# Patient Record
Sex: Female | Born: 1945 | Race: White | Hispanic: No | Smoking: Former smoker
Health system: Southern US, Community
[De-identification: ages and names within clinical notes are randomized; demographics above are authoritative.]

## PROBLEM LIST (undated history)

## (undated) DIAGNOSIS — K5900 Constipation, unspecified: Secondary | ICD-10-CM

## (undated) DIAGNOSIS — IMO0001 Reserved for inherently not codable concepts without codable children: Secondary | ICD-10-CM

## (undated) DIAGNOSIS — H53009 Unspecified amblyopia, unspecified eye: Secondary | ICD-10-CM

## (undated) DIAGNOSIS — F450 Somatization disorder: Secondary | ICD-10-CM

## (undated) DIAGNOSIS — M545 Low back pain: Secondary | ICD-10-CM

## (undated) DIAGNOSIS — T8859XA Other complications of anesthesia, initial encounter: Secondary | ICD-10-CM

## (undated) DIAGNOSIS — R3589 Other polyuria: Secondary | ICD-10-CM

## (undated) DIAGNOSIS — R1013 Epigastric pain: Secondary | ICD-10-CM

## (undated) DIAGNOSIS — C801 Malignant (primary) neoplasm, unspecified: Secondary | ICD-10-CM

## (undated) DIAGNOSIS — G629 Polyneuropathy, unspecified: Secondary | ICD-10-CM

## (undated) DIAGNOSIS — M269 Dentofacial anomaly, unspecified: Secondary | ICD-10-CM

## (undated) DIAGNOSIS — J209 Acute bronchitis, unspecified: Secondary | ICD-10-CM

## (undated) DIAGNOSIS — R51 Headache: Secondary | ICD-10-CM

## (undated) DIAGNOSIS — H5000 Unspecified esotropia: Secondary | ICD-10-CM

## (undated) DIAGNOSIS — I10 Essential (primary) hypertension: Secondary | ICD-10-CM

## (undated) DIAGNOSIS — Z9289 Personal history of other medical treatment: Secondary | ICD-10-CM

## (undated) DIAGNOSIS — F411 Generalized anxiety disorder: Secondary | ICD-10-CM

## (undated) DIAGNOSIS — R7303 Prediabetes: Secondary | ICD-10-CM

## (undated) DIAGNOSIS — K219 Gastro-esophageal reflux disease without esophagitis: Secondary | ICD-10-CM

## (undated) DIAGNOSIS — H269 Unspecified cataract: Secondary | ICD-10-CM

## (undated) DIAGNOSIS — F459 Somatoform disorder, unspecified: Secondary | ICD-10-CM

## (undated) DIAGNOSIS — G43909 Migraine, unspecified, not intractable, without status migrainosus: Secondary | ICD-10-CM

## (undated) DIAGNOSIS — R7309 Other abnormal glucose: Secondary | ICD-10-CM

## (undated) DIAGNOSIS — G500 Trigeminal neuralgia: Secondary | ICD-10-CM

## (undated) DIAGNOSIS — F329 Major depressive disorder, single episode, unspecified: Secondary | ICD-10-CM

## (undated) DIAGNOSIS — E119 Type 2 diabetes mellitus without complications: Secondary | ICD-10-CM

## (undated) DIAGNOSIS — G4733 Obstructive sleep apnea (adult) (pediatric): Secondary | ICD-10-CM

## (undated) DIAGNOSIS — R358 Other polyuria: Secondary | ICD-10-CM

## (undated) DIAGNOSIS — J449 Chronic obstructive pulmonary disease, unspecified: Secondary | ICD-10-CM

## (undated) DIAGNOSIS — E785 Hyperlipidemia, unspecified: Secondary | ICD-10-CM

## (undated) DIAGNOSIS — M1990 Unspecified osteoarthritis, unspecified site: Secondary | ICD-10-CM

## (undated) DIAGNOSIS — G4700 Insomnia, unspecified: Secondary | ICD-10-CM

## (undated) HISTORY — DX: Unspecified osteoarthritis, unspecified site: M19.90

## (undated) HISTORY — DX: Unspecified cataract: H26.9

## (undated) HISTORY — DX: Epigastric pain: R10.13

## (undated) HISTORY — DX: Insomnia, unspecified: G47.00

## (undated) HISTORY — DX: Somatoform disorder, unspecified: F45.9

## (undated) HISTORY — DX: Migraine, unspecified, not intractable, without status migrainosus: G43.909

## (undated) HISTORY — DX: Hyperlipidemia, unspecified: E78.5

## (undated) HISTORY — PX: EYE SURGERY: SHX253

## (undated) HISTORY — DX: Major depressive disorder, single episode, unspecified: F32.9

## (undated) HISTORY — DX: Other polyuria: R35.8

## (undated) HISTORY — DX: Other polyuria: R35.89

## (undated) HISTORY — DX: Reserved for inherently not codable concepts without codable children: IMO0001

## (undated) HISTORY — DX: Headache: R51

## (undated) HISTORY — DX: Obstructive sleep apnea (adult) (pediatric): G47.33

## (undated) HISTORY — DX: Somatization disorder: F45.0

## (undated) HISTORY — DX: Unspecified amblyopia, unspecified eye: H53.009

## (undated) HISTORY — PX: COLONOSCOPY: SHX174

## (undated) HISTORY — DX: Essential (primary) hypertension: I10

## (undated) HISTORY — PX: OTHER SURGICAL HISTORY: SHX169

## (undated) HISTORY — DX: Trigeminal neuralgia: G50.0

## (undated) HISTORY — DX: Chronic obstructive pulmonary disease, unspecified: J44.9

## (undated) HISTORY — DX: Low back pain: M54.5

## (undated) HISTORY — DX: Unspecified esotropia: H50.00

## (undated) HISTORY — DX: Dentofacial anomaly, unspecified: M26.9

## (undated) HISTORY — DX: Acute bronchitis, unspecified: J20.9

## (undated) HISTORY — DX: Other abnormal glucose: R73.09

## (undated) HISTORY — PX: CHOLECYSTECTOMY: SHX55

## (undated) HISTORY — DX: Generalized anxiety disorder: F41.1

---
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
## 1998-12-11 ENCOUNTER — Emergency Department (HOSPITAL_COMMUNITY): Admission: EM | Admit: 1998-12-11 | Discharge: 1998-12-11 | Payer: Self-pay | Admitting: Emergency Medicine

## 1998-12-14 ENCOUNTER — Ambulatory Visit (HOSPITAL_COMMUNITY): Admission: RE | Admit: 1998-12-14 | Discharge: 1998-12-15 | Payer: Self-pay | Admitting: General Surgery

## 1999-08-23 ENCOUNTER — Inpatient Hospital Stay (HOSPITAL_COMMUNITY): Admission: EM | Admit: 1999-08-23 | Discharge: 1999-08-24 | Payer: Self-pay | Admitting: *Deleted

## 1999-08-23 ENCOUNTER — Encounter: Payer: Self-pay | Admitting: Emergency Medicine

## 1999-12-29 ENCOUNTER — Emergency Department (HOSPITAL_COMMUNITY): Admission: EM | Admit: 1999-12-29 | Discharge: 1999-12-29 | Payer: Self-pay

## 2000-01-03 ENCOUNTER — Emergency Department (HOSPITAL_COMMUNITY): Admission: EM | Admit: 2000-01-03 | Discharge: 2000-01-03 | Payer: Self-pay | Admitting: Emergency Medicine

## 2000-01-03 ENCOUNTER — Encounter: Payer: Self-pay | Admitting: Emergency Medicine

## 2000-01-30 ENCOUNTER — Ambulatory Visit (HOSPITAL_COMMUNITY): Admission: RE | Admit: 2000-01-30 | Discharge: 2000-01-30 | Payer: Self-pay | Admitting: Cardiology

## 2000-02-03 ENCOUNTER — Encounter: Payer: Self-pay | Admitting: Cardiology

## 2000-02-03 ENCOUNTER — Ambulatory Visit (HOSPITAL_COMMUNITY): Admission: RE | Admit: 2000-02-03 | Discharge: 2000-02-03 | Payer: Self-pay | Admitting: Cardiology

## 2003-05-27 ENCOUNTER — Emergency Department (HOSPITAL_COMMUNITY): Admission: AD | Admit: 2003-05-27 | Discharge: 2003-05-27 | Payer: Self-pay | Admitting: Family Medicine

## 2003-07-28 ENCOUNTER — Encounter: Admission: RE | Admit: 2003-07-28 | Discharge: 2003-08-29 | Payer: Self-pay | Admitting: Neurosurgery

## 2003-08-29 ENCOUNTER — Ambulatory Visit (HOSPITAL_COMMUNITY): Admission: RE | Admit: 2003-08-29 | Discharge: 2003-08-29 | Payer: Self-pay | Admitting: Internal Medicine

## 2003-09-19 ENCOUNTER — Encounter: Admission: RE | Admit: 2003-09-19 | Discharge: 2003-09-19 | Payer: Self-pay | Admitting: Neurosurgery

## 2003-10-18 ENCOUNTER — Ambulatory Visit (HOSPITAL_COMMUNITY): Admission: RE | Admit: 2003-10-18 | Discharge: 2003-10-18 | Payer: Self-pay | Admitting: Neurosurgery

## 2004-06-27 ENCOUNTER — Ambulatory Visit: Payer: Self-pay | Admitting: Internal Medicine

## 2004-07-11 ENCOUNTER — Ambulatory Visit: Payer: Self-pay | Admitting: Internal Medicine

## 2004-08-03 ENCOUNTER — Emergency Department (HOSPITAL_COMMUNITY): Admission: EM | Admit: 2004-08-03 | Discharge: 2004-08-03 | Payer: Self-pay | Admitting: Family Medicine

## 2004-09-27 ENCOUNTER — Emergency Department (HOSPITAL_COMMUNITY): Admission: EM | Admit: 2004-09-27 | Discharge: 2004-09-27 | Payer: Self-pay | Admitting: Family Medicine

## 2004-10-18 ENCOUNTER — Ambulatory Visit: Payer: Self-pay | Admitting: Internal Medicine

## 2004-10-18 ENCOUNTER — Inpatient Hospital Stay (HOSPITAL_COMMUNITY): Admission: EM | Admit: 2004-10-18 | Discharge: 2004-10-19 | Payer: Self-pay | Admitting: Emergency Medicine

## 2004-10-22 ENCOUNTER — Ambulatory Visit: Payer: Self-pay

## 2004-11-05 ENCOUNTER — Ambulatory Visit: Payer: Self-pay | Admitting: Internal Medicine

## 2004-11-08 ENCOUNTER — Encounter: Admission: RE | Admit: 2004-11-08 | Discharge: 2004-11-08 | Payer: Self-pay | Admitting: Internal Medicine

## 2004-11-21 ENCOUNTER — Other Ambulatory Visit: Admission: RE | Admit: 2004-11-21 | Discharge: 2004-11-21 | Payer: Self-pay | Admitting: Obstetrics and Gynecology

## 2004-12-04 ENCOUNTER — Ambulatory Visit: Payer: Self-pay | Admitting: Internal Medicine

## 2004-12-05 ENCOUNTER — Ambulatory Visit: Payer: Self-pay | Admitting: Internal Medicine

## 2005-01-11 ENCOUNTER — Ambulatory Visit: Payer: Self-pay | Admitting: Internal Medicine

## 2005-02-13 ENCOUNTER — Ambulatory Visit: Payer: Self-pay | Admitting: Internal Medicine

## 2005-02-18 ENCOUNTER — Emergency Department (HOSPITAL_COMMUNITY): Admission: EM | Admit: 2005-02-18 | Discharge: 2005-02-18 | Payer: Self-pay | Admitting: Family Medicine

## 2005-03-18 ENCOUNTER — Ambulatory Visit: Payer: Self-pay | Admitting: Internal Medicine

## 2005-04-10 ENCOUNTER — Ambulatory Visit: Payer: Self-pay | Admitting: Internal Medicine

## 2005-04-10 ENCOUNTER — Ambulatory Visit (HOSPITAL_COMMUNITY): Admission: RE | Admit: 2005-04-10 | Discharge: 2005-04-10 | Payer: Self-pay | Admitting: Internal Medicine

## 2005-04-11 ENCOUNTER — Ambulatory Visit: Payer: Self-pay | Admitting: Internal Medicine

## 2005-04-14 ENCOUNTER — Ambulatory Visit: Payer: Self-pay | Admitting: Internal Medicine

## 2005-05-05 ENCOUNTER — Ambulatory Visit: Payer: Self-pay | Admitting: Internal Medicine

## 2005-05-20 ENCOUNTER — Ambulatory Visit: Payer: Self-pay | Admitting: Internal Medicine

## 2005-05-29 ENCOUNTER — Ambulatory Visit: Payer: Self-pay | Admitting: Internal Medicine

## 2005-05-29 DIAGNOSIS — R209 Unspecified disturbances of skin sensation: Secondary | ICD-10-CM | POA: Insufficient documentation

## 2005-06-10 ENCOUNTER — Ambulatory Visit: Payer: Self-pay | Admitting: Internal Medicine

## 2005-07-03 ENCOUNTER — Ambulatory Visit: Payer: Self-pay | Admitting: Internal Medicine

## 2005-09-11 ENCOUNTER — Ambulatory Visit: Payer: Self-pay | Admitting: Internal Medicine

## 2005-10-10 ENCOUNTER — Other Ambulatory Visit: Admission: RE | Admit: 2005-10-10 | Discharge: 2005-10-10 | Payer: Self-pay | Admitting: Internal Medicine

## 2005-10-10 ENCOUNTER — Encounter: Payer: Self-pay | Admitting: Internal Medicine

## 2005-10-10 ENCOUNTER — Ambulatory Visit: Payer: Self-pay | Admitting: Internal Medicine

## 2005-11-07 ENCOUNTER — Ambulatory Visit: Payer: Self-pay | Admitting: Internal Medicine

## 2005-11-10 ENCOUNTER — Encounter: Admission: RE | Admit: 2005-11-10 | Discharge: 2005-11-10 | Payer: Self-pay | Admitting: *Deleted

## 2005-11-12 ENCOUNTER — Ambulatory Visit: Payer: Self-pay | Admitting: Internal Medicine

## 2006-01-02 ENCOUNTER — Ambulatory Visit: Payer: Self-pay | Admitting: Internal Medicine

## 2006-01-02 DIAGNOSIS — F32A Depression, unspecified: Secondary | ICD-10-CM | POA: Insufficient documentation

## 2006-01-02 DIAGNOSIS — F329 Major depressive disorder, single episode, unspecified: Secondary | ICD-10-CM

## 2006-01-02 DIAGNOSIS — F3289 Other specified depressive episodes: Secondary | ICD-10-CM

## 2006-01-02 DIAGNOSIS — F419 Anxiety disorder, unspecified: Secondary | ICD-10-CM | POA: Insufficient documentation

## 2006-01-02 HISTORY — DX: Major depressive disorder, single episode, unspecified: F32.9

## 2006-01-02 HISTORY — DX: Other specified depressive episodes: F32.89

## 2006-04-02 ENCOUNTER — Ambulatory Visit: Payer: Self-pay | Admitting: Family Medicine

## 2006-04-02 DIAGNOSIS — M791 Myalgia, unspecified site: Secondary | ICD-10-CM | POA: Insufficient documentation

## 2006-04-02 DIAGNOSIS — IMO0001 Reserved for inherently not codable concepts without codable children: Secondary | ICD-10-CM

## 2006-04-02 HISTORY — DX: Reserved for inherently not codable concepts without codable children: IMO0001

## 2006-04-30 ENCOUNTER — Ambulatory Visit: Payer: Self-pay | Admitting: Family Medicine

## 2006-04-30 DIAGNOSIS — E785 Hyperlipidemia, unspecified: Secondary | ICD-10-CM | POA: Insufficient documentation

## 2006-04-30 HISTORY — DX: Hyperlipidemia, unspecified: E78.5

## 2006-05-06 ENCOUNTER — Ambulatory Visit: Payer: Self-pay | Admitting: Family Medicine

## 2006-05-12 ENCOUNTER — Ambulatory Visit: Payer: Self-pay | Admitting: Family Medicine

## 2006-05-25 DIAGNOSIS — F411 Generalized anxiety disorder: Secondary | ICD-10-CM | POA: Insufficient documentation

## 2006-05-25 DIAGNOSIS — I10 Essential (primary) hypertension: Secondary | ICD-10-CM

## 2006-05-25 DIAGNOSIS — J449 Chronic obstructive pulmonary disease, unspecified: Secondary | ICD-10-CM

## 2006-05-25 DIAGNOSIS — M1990 Unspecified osteoarthritis, unspecified site: Secondary | ICD-10-CM

## 2006-05-25 DIAGNOSIS — J4489 Other specified chronic obstructive pulmonary disease: Secondary | ICD-10-CM

## 2006-05-25 DIAGNOSIS — J441 Chronic obstructive pulmonary disease with (acute) exacerbation: Secondary | ICD-10-CM | POA: Insufficient documentation

## 2006-05-25 DIAGNOSIS — R358 Other polyuria: Secondary | ICD-10-CM

## 2006-05-25 DIAGNOSIS — R3589 Other polyuria: Secondary | ICD-10-CM | POA: Insufficient documentation

## 2006-05-25 HISTORY — DX: Other specified chronic obstructive pulmonary disease: J44.89

## 2006-05-25 HISTORY — DX: Essential (primary) hypertension: I10

## 2006-05-25 HISTORY — DX: Chronic obstructive pulmonary disease, unspecified: J44.9

## 2006-05-25 HISTORY — DX: Generalized anxiety disorder: F41.1

## 2006-05-25 HISTORY — DX: Unspecified osteoarthritis, unspecified site: M19.90

## 2006-06-09 ENCOUNTER — Ambulatory Visit: Payer: Self-pay | Admitting: Family Medicine

## 2006-08-24 ENCOUNTER — Ambulatory Visit: Payer: Self-pay | Admitting: Family Medicine

## 2006-10-29 ENCOUNTER — Ambulatory Visit: Payer: Self-pay | Admitting: Family Medicine

## 2006-11-12 ENCOUNTER — Ambulatory Visit: Payer: Self-pay | Admitting: Family Medicine

## 2006-11-28 DIAGNOSIS — F17200 Nicotine dependence, unspecified, uncomplicated: Secondary | ICD-10-CM | POA: Insufficient documentation

## 2006-11-30 ENCOUNTER — Ambulatory Visit: Payer: Self-pay | Admitting: Family Medicine

## 2006-12-23 ENCOUNTER — Encounter: Admission: RE | Admit: 2006-12-23 | Discharge: 2006-12-23 | Payer: Self-pay | Admitting: Family Medicine

## 2007-01-27 DIAGNOSIS — M545 Low back pain, unspecified: Secondary | ICD-10-CM

## 2007-01-27 HISTORY — DX: Low back pain, unspecified: M54.50

## 2007-01-28 ENCOUNTER — Emergency Department (HOSPITAL_COMMUNITY): Admission: EM | Admit: 2007-01-28 | Discharge: 2007-01-28 | Payer: Self-pay | Admitting: *Deleted

## 2007-01-28 ENCOUNTER — Ambulatory Visit: Payer: Self-pay | Admitting: Family Medicine

## 2007-03-17 ENCOUNTER — Ambulatory Visit: Payer: Self-pay | Admitting: Family Medicine

## 2007-03-23 ENCOUNTER — Ambulatory Visit: Payer: Self-pay | Admitting: Family Medicine

## 2007-07-05 ENCOUNTER — Ambulatory Visit: Payer: Self-pay | Admitting: Family Medicine

## 2007-07-13 ENCOUNTER — Ambulatory Visit: Payer: Self-pay | Admitting: Family Medicine

## 2007-07-30 ENCOUNTER — Ambulatory Visit: Payer: Self-pay | Admitting: Family Medicine

## 2007-08-23 ENCOUNTER — Encounter (INDEPENDENT_AMBULATORY_CARE_PROVIDER_SITE_OTHER): Payer: Self-pay | Admitting: *Deleted

## 2007-08-29 ENCOUNTER — Emergency Department (HOSPITAL_COMMUNITY): Admission: EM | Admit: 2007-08-29 | Discharge: 2007-08-29 | Payer: Self-pay | Admitting: Family Medicine

## 2007-08-30 ENCOUNTER — Ambulatory Visit: Payer: Self-pay | Admitting: Family Medicine

## 2007-09-07 ENCOUNTER — Telehealth (INDEPENDENT_AMBULATORY_CARE_PROVIDER_SITE_OTHER): Payer: Self-pay | Admitting: Family Medicine

## 2007-11-19 ENCOUNTER — Ambulatory Visit: Payer: Self-pay | Admitting: Internal Medicine

## 2007-11-19 DIAGNOSIS — M2550 Pain in unspecified joint: Secondary | ICD-10-CM | POA: Insufficient documentation

## 2007-11-22 LAB — CONVERTED CEMR LAB
ALT: 31 units/L (ref 0–35)
AST: 39 units/L — ABNORMAL HIGH (ref 0–37)
Albumin: 3.8 g/dL (ref 3.5–5.2)
Alkaline Phosphatase: 79 units/L (ref 39–117)
BUN: 16 mg/dL (ref 6–23)
Basophils Absolute: 0.1 10*3/uL (ref 0.0–0.1)
Basophils Relative: 0.6 % (ref 0.0–1.0)
Bilirubin Urine: NEGATIVE
Bilirubin, Direct: 0.1 mg/dL (ref 0.0–0.3)
CO2: 30 meq/L (ref 19–32)
Calcium: 9.1 mg/dL (ref 8.4–10.5)
Chloride: 107 meq/L (ref 96–112)
Creatinine, Ser: 0.9 mg/dL (ref 0.4–1.2)
Crystals: NEGATIVE
Eosinophils Absolute: 0.1 10*3/uL (ref 0.0–0.7)
Eosinophils Relative: 0.8 % (ref 0.0–5.0)
GFR calc Af Amer: 82 mL/min
GFR calc non Af Amer: 68 mL/min
Glucose, Bld: 135 mg/dL — ABNORMAL HIGH (ref 70–99)
HCT: 41.3 % (ref 36.0–46.0)
Hemoglobin, Urine: NEGATIVE
Hemoglobin: 14.2 g/dL (ref 12.0–15.0)
Hgb A1c MFr Bld: 5.8 % (ref 4.6–6.0)
Ketones, ur: NEGATIVE mg/dL
Lymphocytes Relative: 29.6 % (ref 12.0–46.0)
MCHC: 34.3 g/dL (ref 30.0–36.0)
MCV: 93.5 fL (ref 78.0–100.0)
Monocytes Absolute: 0.6 10*3/uL (ref 0.1–1.0)
Monocytes Relative: 6.3 % (ref 3.0–12.0)
Mucus, UA: NEGATIVE
Neutro Abs: 5.6 10*3/uL (ref 1.4–7.7)
Neutrophils Relative %: 62.7 % (ref 43.0–77.0)
Nitrite: NEGATIVE
Platelets: 285 10*3/uL (ref 150–400)
Potassium: 3.3 meq/L — ABNORMAL LOW (ref 3.5–5.1)
RBC / HPF: NONE SEEN
RBC: 4.41 M/uL (ref 3.87–5.11)
RDW: 11.9 % (ref 11.5–14.6)
Sodium: 142 meq/L (ref 135–145)
Specific Gravity, Urine: 1.025 (ref 1.000–1.03)
TSH: 1.45 microintl units/mL (ref 0.35–5.50)
Total Bilirubin: 0.6 mg/dL (ref 0.3–1.2)
Total Protein, Urine: NEGATIVE mg/dL
Total Protein: 6.8 g/dL (ref 6.0–8.3)
Urine Glucose: NEGATIVE mg/dL
Urobilinogen, UA: 0.2 (ref 0.0–1.0)
Vitamin B-12: 403 pg/mL (ref 211–911)
WBC: 9.1 10*3/uL (ref 4.5–10.5)
pH: 5.5 (ref 5.0–8.0)

## 2007-12-08 ENCOUNTER — Ambulatory Visit: Payer: Self-pay | Admitting: Internal Medicine

## 2007-12-08 DIAGNOSIS — R5382 Chronic fatigue, unspecified: Secondary | ICD-10-CM | POA: Insufficient documentation

## 2007-12-08 DIAGNOSIS — J209 Acute bronchitis, unspecified: Secondary | ICD-10-CM

## 2007-12-08 DIAGNOSIS — R51 Headache: Secondary | ICD-10-CM | POA: Insufficient documentation

## 2007-12-08 DIAGNOSIS — R059 Cough, unspecified: Secondary | ICD-10-CM | POA: Insufficient documentation

## 2007-12-08 DIAGNOSIS — R05 Cough: Secondary | ICD-10-CM

## 2007-12-08 DIAGNOSIS — R519 Headache, unspecified: Secondary | ICD-10-CM | POA: Insufficient documentation

## 2007-12-08 HISTORY — DX: Headache: R51

## 2007-12-08 HISTORY — DX: Acute bronchitis, unspecified: J20.9

## 2007-12-15 ENCOUNTER — Telehealth: Payer: Self-pay | Admitting: Internal Medicine

## 2008-01-07 ENCOUNTER — Encounter: Admission: RE | Admit: 2008-01-07 | Discharge: 2008-01-07 | Payer: Self-pay | Admitting: Internal Medicine

## 2008-01-18 ENCOUNTER — Ambulatory Visit: Payer: Self-pay | Admitting: Internal Medicine

## 2008-01-18 DIAGNOSIS — R61 Generalized hyperhidrosis: Secondary | ICD-10-CM | POA: Insufficient documentation

## 2008-02-22 ENCOUNTER — Ambulatory Visit: Payer: Self-pay | Admitting: Internal Medicine

## 2008-02-22 DIAGNOSIS — G4700 Insomnia, unspecified: Secondary | ICD-10-CM

## 2008-02-22 DIAGNOSIS — N3090 Cystitis, unspecified without hematuria: Secondary | ICD-10-CM | POA: Insufficient documentation

## 2008-02-22 HISTORY — DX: Insomnia, unspecified: G47.00

## 2008-02-24 LAB — CONVERTED CEMR LAB
Bilirubin Urine: NEGATIVE
Crystals: NEGATIVE
Hemoglobin, Urine: NEGATIVE
Nitrite: NEGATIVE
RBC / HPF: NONE SEEN
Specific Gravity, Urine: 1.02 (ref 1.000–1.03)
Total Protein, Urine: NEGATIVE mg/dL
Urine Glucose: NEGATIVE mg/dL
Urobilinogen, UA: 0.2 (ref 0.0–1.0)
pH: 6 (ref 5.0–8.0)

## 2008-02-28 ENCOUNTER — Other Ambulatory Visit: Admission: RE | Admit: 2008-02-28 | Discharge: 2008-02-28 | Payer: Self-pay | Admitting: Internal Medicine

## 2008-02-28 ENCOUNTER — Ambulatory Visit: Payer: Self-pay | Admitting: Internal Medicine

## 2008-02-28 ENCOUNTER — Encounter: Payer: Self-pay | Admitting: Internal Medicine

## 2008-03-07 ENCOUNTER — Encounter: Payer: Self-pay | Admitting: Internal Medicine

## 2008-03-28 ENCOUNTER — Ambulatory Visit: Payer: Self-pay | Admitting: Internal Medicine

## 2008-04-27 ENCOUNTER — Ambulatory Visit: Payer: Self-pay | Admitting: Internal Medicine

## 2008-07-20 ENCOUNTER — Ambulatory Visit: Payer: Self-pay | Admitting: Internal Medicine

## 2008-07-20 LAB — CONVERTED CEMR LAB
ALT: 28 units/L (ref 0–35)
AST: 27 units/L (ref 0–37)
Albumin: 3.7 g/dL (ref 3.5–5.2)
Alkaline Phosphatase: 67 units/L (ref 39–117)
BUN: 17 mg/dL (ref 6–23)
Bilirubin, Direct: 0.1 mg/dL (ref 0.0–0.3)
CO2: 27 meq/L (ref 19–32)
Calcium: 9.6 mg/dL (ref 8.4–10.5)
Chloride: 105 meq/L (ref 96–112)
Creatinine, Ser: 0.8 mg/dL (ref 0.4–1.2)
GFR calc Af Amer: 94 mL/min
GFR calc non Af Amer: 78 mL/min
Glucose, Bld: 92 mg/dL (ref 70–99)
Potassium: 4.5 meq/L (ref 3.5–5.1)
Sodium: 139 meq/L (ref 135–145)
TSH: 1.57 microintl units/mL (ref 0.35–5.50)
Total Bilirubin: 0.5 mg/dL (ref 0.3–1.2)
Total Protein: 6.6 g/dL (ref 6.0–8.3)

## 2008-07-28 ENCOUNTER — Ambulatory Visit: Payer: Self-pay | Admitting: Internal Medicine

## 2008-07-28 DIAGNOSIS — R000 Tachycardia, unspecified: Secondary | ICD-10-CM | POA: Insufficient documentation

## 2008-07-28 DIAGNOSIS — L299 Pruritus, unspecified: Secondary | ICD-10-CM | POA: Insufficient documentation

## 2008-09-19 ENCOUNTER — Ambulatory Visit: Payer: Self-pay | Admitting: Internal Medicine

## 2008-09-27 ENCOUNTER — Telehealth: Payer: Self-pay | Admitting: Internal Medicine

## 2008-10-05 ENCOUNTER — Encounter: Payer: Self-pay | Admitting: Internal Medicine

## 2008-11-30 ENCOUNTER — Ambulatory Visit: Payer: Self-pay | Admitting: Internal Medicine

## 2009-01-05 ENCOUNTER — Emergency Department (HOSPITAL_COMMUNITY): Admission: EM | Admit: 2009-01-05 | Discharge: 2009-01-05 | Payer: Self-pay | Admitting: Emergency Medicine

## 2009-03-01 ENCOUNTER — Ambulatory Visit: Payer: Self-pay | Admitting: Internal Medicine

## 2009-03-01 DIAGNOSIS — R609 Edema, unspecified: Secondary | ICD-10-CM | POA: Insufficient documentation

## 2009-03-01 DIAGNOSIS — R599 Enlarged lymph nodes, unspecified: Secondary | ICD-10-CM | POA: Insufficient documentation

## 2009-03-29 ENCOUNTER — Ambulatory Visit: Payer: Self-pay | Admitting: Internal Medicine

## 2009-04-13 ENCOUNTER — Ambulatory Visit: Payer: Self-pay | Admitting: Internal Medicine

## 2009-04-13 DIAGNOSIS — N644 Mastodynia: Secondary | ICD-10-CM | POA: Insufficient documentation

## 2009-05-11 ENCOUNTER — Ambulatory Visit: Payer: Self-pay | Admitting: Internal Medicine

## 2009-05-11 DIAGNOSIS — M269 Dentofacial anomaly, unspecified: Secondary | ICD-10-CM

## 2009-05-11 DIAGNOSIS — B009 Herpesviral infection, unspecified: Secondary | ICD-10-CM | POA: Insufficient documentation

## 2009-05-11 HISTORY — DX: Dentofacial anomaly, unspecified: M26.9

## 2009-05-13 ENCOUNTER — Emergency Department (HOSPITAL_COMMUNITY): Admission: EM | Admit: 2009-05-13 | Discharge: 2009-05-13 | Payer: Self-pay | Admitting: Emergency Medicine

## 2009-05-15 ENCOUNTER — Ambulatory Visit: Payer: Self-pay | Admitting: Internal Medicine

## 2009-05-15 DIAGNOSIS — G500 Trigeminal neuralgia: Secondary | ICD-10-CM

## 2009-05-15 HISTORY — DX: Trigeminal neuralgia: G50.0

## 2009-05-15 LAB — CONVERTED CEMR LAB
ALT: 26 units/L (ref 0–35)
AST: 30 units/L (ref 0–37)
Albumin: 4.1 g/dL (ref 3.5–5.2)
Alkaline Phosphatase: 98 units/L (ref 39–117)
BUN: 18 mg/dL (ref 6–23)
Basophils Absolute: 0.1 10*3/uL (ref 0.0–0.1)
Basophils Relative: 1.1 % (ref 0.0–3.0)
Bilirubin, Direct: 0.1 mg/dL (ref 0.0–0.3)
CO2: 28 meq/L (ref 19–32)
Calcium: 9 mg/dL (ref 8.4–10.5)
Chloride: 103 meq/L (ref 96–112)
Creatinine, Ser: 0.7 mg/dL (ref 0.4–1.2)
Eosinophils Absolute: 0.1 10*3/uL (ref 0.0–0.7)
Eosinophils Relative: 1.1 % (ref 0.0–5.0)
GFR calc non Af Amer: 90.4 mL/min (ref >60)
Glucose, Bld: 94 mg/dL (ref 70–99)
HCT: 42.2 % (ref 36.0–46.0)
Hemoglobin: 14.3 g/dL (ref 12.0–15.0)
Hgb A1c MFr Bld: 5.6 % (ref 4.6–6.5)
Lymphocytes Relative: 37.7 % (ref 12.0–46.0)
Lymphs Abs: 2.7 10*3/uL (ref 0.7–4.0)
MCHC: 33.8 g/dL (ref 30.0–36.0)
MCV: 94.1 fL (ref 78.0–100.0)
Monocytes Absolute: 0.4 10*3/uL (ref 0.1–1.0)
Monocytes Relative: 5.6 % (ref 3.0–12.0)
Neutro Abs: 3.8 10*3/uL (ref 1.4–7.7)
Neutrophils Relative %: 54.5 % (ref 43.0–77.0)
Platelets: 230 10*3/uL (ref 150.0–400.0)
Potassium: 4.5 meq/L (ref 3.5–5.1)
RBC: 4.48 M/uL (ref 3.87–5.11)
RDW: 11.8 % (ref 11.5–14.6)
Sed Rate: 8 mm/hr (ref 0–22)
Sodium: 141 meq/L (ref 135–145)
TSH: 0.66 microintl units/mL (ref 0.35–5.50)
Total Bilirubin: 0.6 mg/dL (ref 0.3–1.2)
Total Protein: 7.1 g/dL (ref 6.0–8.3)
Vitamin B-12: 368 pg/mL (ref 211–911)
WBC: 7.1 10*3/uL (ref 4.5–10.5)

## 2009-05-30 ENCOUNTER — Emergency Department (HOSPITAL_COMMUNITY): Admission: EM | Admit: 2009-05-30 | Discharge: 2009-05-30 | Payer: Self-pay | Admitting: Emergency Medicine

## 2009-06-06 ENCOUNTER — Ambulatory Visit: Payer: Self-pay | Admitting: Internal Medicine

## 2009-06-11 ENCOUNTER — Ambulatory Visit: Payer: Self-pay | Admitting: Cardiology

## 2009-06-14 ENCOUNTER — Telehealth (INDEPENDENT_AMBULATORY_CARE_PROVIDER_SITE_OTHER): Payer: Self-pay | Admitting: *Deleted

## 2009-07-02 ENCOUNTER — Telehealth: Payer: Self-pay | Admitting: Internal Medicine

## 2009-07-11 ENCOUNTER — Ambulatory Visit: Payer: Self-pay | Admitting: Internal Medicine

## 2009-07-11 DIAGNOSIS — M25569 Pain in unspecified knee: Secondary | ICD-10-CM | POA: Insufficient documentation

## 2009-07-14 DIAGNOSIS — F459 Somatoform disorder, unspecified: Secondary | ICD-10-CM

## 2009-07-14 HISTORY — DX: Somatoform disorder, unspecified: F45.9

## 2009-08-02 ENCOUNTER — Telehealth: Payer: Self-pay | Admitting: Internal Medicine

## 2009-08-22 ENCOUNTER — Ambulatory Visit: Payer: Self-pay | Admitting: Internal Medicine

## 2009-10-17 ENCOUNTER — Telehealth: Payer: Self-pay | Admitting: Internal Medicine

## 2009-10-31 ENCOUNTER — Ambulatory Visit: Payer: Self-pay | Admitting: Internal Medicine

## 2009-11-01 LAB — CONVERTED CEMR LAB
Bilirubin Urine: NEGATIVE
Ketones, ur: NEGATIVE mg/dL
Nitrite: NEGATIVE
Specific Gravity, Urine: >=1.03 (ref 1.000–1.030)
Total Protein, Urine: NEGATIVE mg/dL
Urine Glucose: NEGATIVE mg/dL
Urobilinogen, UA: 0.2 (ref 0.0–1.0)
pH: 5 (ref 5.0–8.0)

## 2009-12-18 ENCOUNTER — Ambulatory Visit: Payer: Self-pay | Admitting: Internal Medicine

## 2009-12-18 DIAGNOSIS — R1013 Epigastric pain: Secondary | ICD-10-CM

## 2009-12-18 DIAGNOSIS — M6530 Trigger finger, unspecified finger: Secondary | ICD-10-CM | POA: Insufficient documentation

## 2009-12-18 HISTORY — DX: Epigastric pain: R10.13

## 2009-12-19 LAB — CONVERTED CEMR LAB
ALT: 23 units/L (ref 0–35)
AST: 25 units/L (ref 0–37)
Albumin: 4.1 g/dL (ref 3.5–5.2)
Alkaline Phosphatase: 94 units/L (ref 39–117)
BUN: 23 mg/dL (ref 6–23)
Basophils Absolute: 0 10*3/uL (ref 0.0–0.1)
Basophils Relative: 0.4 % (ref 0.0–3.0)
Bilirubin Urine: NEGATIVE
Bilirubin, Direct: 0.2 mg/dL (ref 0.0–0.3)
CO2: 30 meq/L (ref 19–32)
Calcium: 9.6 mg/dL (ref 8.4–10.5)
Chloride: 107 meq/L (ref 96–112)
Creatinine, Ser: 0.6 mg/dL (ref 0.4–1.2)
Eosinophils Absolute: 0.1 10*3/uL (ref 0.0–0.7)
Eosinophils Relative: 1.2 % (ref 0.0–5.0)
GFR calc non Af Amer: 109.9 mL/min (ref >60)
Glucose, Bld: 128 mg/dL — ABNORMAL HIGH (ref 70–99)
HCT: 38.6 % (ref 36.0–46.0)
Hemoglobin, Urine: NEGATIVE
Hemoglobin: 13.3 g/dL (ref 12.0–15.0)
Ketones, ur: NEGATIVE mg/dL
Leukocytes, UA: NEGATIVE
Lipase: 16 units/L (ref 11.0–59.0)
Lymphocytes Relative: 36 % (ref 12.0–46.0)
Lymphs Abs: 2.5 10*3/uL (ref 0.7–4.0)
MCHC: 34.5 g/dL (ref 30.0–36.0)
MCV: 92.7 fL (ref 78.0–100.0)
Monocytes Absolute: 0.5 10*3/uL (ref 0.1–1.0)
Monocytes Relative: 6.6 % (ref 3.0–12.0)
Neutro Abs: 3.9 10*3/uL (ref 1.4–7.7)
Neutrophils Relative %: 55.8 % (ref 43.0–77.0)
Nitrite: NEGATIVE
Platelets: 233 10*3/uL (ref 150.0–400.0)
Potassium: 4.1 meq/L (ref 3.5–5.1)
RBC: 4.16 M/uL (ref 3.87–5.11)
RDW: 13.4 % (ref 11.5–14.6)
Sed Rate: 10 mm/hr (ref 0–22)
Sodium: 144 meq/L (ref 135–145)
Specific Gravity, Urine: >=1.03 (ref 1.000–1.030)
Total Bilirubin: 0.7 mg/dL (ref 0.3–1.2)
Total Protein, Urine: NEGATIVE mg/dL
Total Protein: 6.5 g/dL (ref 6.0–8.3)
Urine Glucose: NEGATIVE mg/dL
Urobilinogen, UA: 0.2 (ref 0.0–1.0)
WBC: 7 10*3/uL (ref 4.5–10.5)
pH: 5.5 (ref 5.0–8.0)

## 2009-12-21 ENCOUNTER — Ambulatory Visit: Payer: Self-pay | Admitting: Cardiology

## 2010-01-18 ENCOUNTER — Ambulatory Visit: Payer: Self-pay | Admitting: Internal Medicine

## 2010-01-28 ENCOUNTER — Telehealth: Payer: Self-pay | Admitting: Internal Medicine

## 2010-02-15 ENCOUNTER — Telehealth: Payer: Self-pay | Admitting: Internal Medicine

## 2010-02-25 ENCOUNTER — Ambulatory Visit: Payer: Self-pay | Admitting: Internal Medicine

## 2010-02-25 LAB — CONVERTED CEMR LAB: Vit D, 25-Hydroxy: 34 ng/mL (ref 30–89)

## 2010-02-26 LAB — CONVERTED CEMR LAB
ALT: 21 units/L (ref 0–35)
AST: 21 units/L (ref 0–37)
Albumin: 4.1 g/dL (ref 3.5–5.2)
Alkaline Phosphatase: 87 units/L (ref 39–117)
BUN: 16 mg/dL (ref 6–23)
Basophils Absolute: 0 10*3/uL (ref 0.0–0.1)
Basophils Relative: 0.5 % (ref 0.0–3.0)
Bilirubin Urine: NEGATIVE
Bilirubin, Direct: 0.1 mg/dL (ref 0.0–0.3)
CO2: 27 meq/L (ref 19–32)
Calcium: 9.5 mg/dL (ref 8.4–10.5)
Chloride: 106 meq/L (ref 96–112)
Creatinine, Ser: 0.7 mg/dL (ref 0.4–1.2)
Eosinophils Absolute: 0.1 10*3/uL (ref 0.0–0.7)
Eosinophils Relative: 1 % (ref 0.0–5.0)
GFR calc non Af Amer: 96.5 mL/min (ref >60)
Glucose, Bld: 92 mg/dL (ref 70–99)
HCT: 41.1 % (ref 36.0–46.0)
Hemoglobin: 14.2 g/dL (ref 12.0–15.0)
Hgb A1c MFr Bld: 5.9 % (ref 4.6–6.5)
Ketones, ur: NEGATIVE mg/dL
Lymphocytes Relative: 35.7 % (ref 12.0–46.0)
Lymphs Abs: 2.9 10*3/uL (ref 0.7–4.0)
MCHC: 34.6 g/dL (ref 30.0–36.0)
MCV: 94.6 fL (ref 78.0–100.0)
Monocytes Absolute: 0.6 10*3/uL (ref 0.1–1.0)
Monocytes Relative: 7 % (ref 3.0–12.0)
Neutro Abs: 4.5 10*3/uL (ref 1.4–7.7)
Neutrophils Relative %: 55.8 % (ref 43.0–77.0)
Nitrite: NEGATIVE
Platelets: 238 10*3/uL (ref 150.0–400.0)
Potassium: 4.4 meq/L (ref 3.5–5.1)
RBC: 4.34 M/uL (ref 3.87–5.11)
RDW: 13.3 % (ref 11.5–14.6)
Sed Rate: 7 mm/hr (ref 0–22)
Sodium: 140 meq/L (ref 135–145)
Specific Gravity, Urine: >1.03 (ref 1.000–1.030)
TSH: 1.46 microintl units/mL (ref 0.35–5.50)
Total Bilirubin: 0.3 mg/dL (ref 0.3–1.2)
Total Protein, Urine: NEGATIVE mg/dL
Total Protein: 7 g/dL (ref 6.0–8.3)
Urine Glucose: NEGATIVE mg/dL
Urobilinogen, UA: 0.2 (ref 0.0–1.0)
Vitamin B-12: 332 pg/mL (ref 211–911)
WBC: 8 10*3/uL (ref 4.5–10.5)
pH: 5 (ref 5.0–8.0)

## 2010-03-15 ENCOUNTER — Ambulatory Visit: Payer: Self-pay | Admitting: Internal Medicine

## 2010-03-15 ENCOUNTER — Encounter: Payer: Self-pay | Admitting: Internal Medicine

## 2010-03-15 DIAGNOSIS — R0602 Shortness of breath: Secondary | ICD-10-CM | POA: Insufficient documentation

## 2010-03-15 LAB — CONVERTED CEMR LAB
BUN: 23 mg/dL (ref 6–23)
CO2: 28 meq/L (ref 19–32)
Calcium: 8.8 mg/dL (ref 8.4–10.5)
Chloride: 104 meq/L (ref 96–112)
Creatinine, Ser: 0.7 mg/dL (ref 0.4–1.2)
GFR calc non Af Amer: 90.15 mL/min (ref >60)
Glucose, Bld: 107 mg/dL — ABNORMAL HIGH (ref 70–99)
Potassium: 3.9 meq/L (ref 3.5–5.1)
Pro B Natriuretic peptide (BNP): 10.2 pg/mL (ref 0.0–100.0)
Sodium: 139 meq/L (ref 135–145)
TSH: 0.8 microintl units/mL (ref 0.35–5.50)

## 2010-03-25 ENCOUNTER — Telehealth (INDEPENDENT_AMBULATORY_CARE_PROVIDER_SITE_OTHER): Payer: Self-pay | Admitting: *Deleted

## 2010-03-26 ENCOUNTER — Ambulatory Visit: Payer: Self-pay

## 2010-03-26 ENCOUNTER — Encounter: Payer: Self-pay | Admitting: Cardiology

## 2010-03-26 ENCOUNTER — Ambulatory Visit: Payer: Self-pay | Admitting: Cardiology

## 2010-03-26 ENCOUNTER — Encounter (HOSPITAL_COMMUNITY): Admission: RE | Admit: 2010-03-26 | Discharge: 2010-05-15 | Payer: Self-pay | Admitting: Internal Medicine

## 2010-03-27 ENCOUNTER — Emergency Department (HOSPITAL_COMMUNITY): Admission: EM | Admit: 2010-03-27 | Discharge: 2010-03-28 | Payer: Self-pay | Admitting: Emergency Medicine

## 2010-03-31 ENCOUNTER — Ambulatory Visit (HOSPITAL_BASED_OUTPATIENT_CLINIC_OR_DEPARTMENT_OTHER): Admission: RE | Admit: 2010-03-31 | Discharge: 2010-03-31 | Payer: Self-pay | Admitting: Internal Medicine

## 2010-03-31 ENCOUNTER — Encounter: Payer: Self-pay | Admitting: Internal Medicine

## 2010-03-31 ENCOUNTER — Encounter: Payer: Self-pay | Admitting: Pulmonary Disease

## 2010-04-09 ENCOUNTER — Ambulatory Visit: Payer: Self-pay | Admitting: Pulmonary Disease

## 2010-04-10 ENCOUNTER — Ambulatory Visit: Payer: Self-pay | Admitting: Internal Medicine

## 2010-04-10 DIAGNOSIS — F450 Somatization disorder: Secondary | ICD-10-CM

## 2010-04-10 DIAGNOSIS — E669 Obesity, unspecified: Secondary | ICD-10-CM | POA: Insufficient documentation

## 2010-04-10 HISTORY — DX: Somatization disorder: F45.0

## 2010-04-17 ENCOUNTER — Encounter: Payer: Self-pay | Admitting: Internal Medicine

## 2010-04-17 DIAGNOSIS — G4733 Obstructive sleep apnea (adult) (pediatric): Secondary | ICD-10-CM

## 2010-04-17 HISTORY — DX: Obstructive sleep apnea (adult) (pediatric): G47.33

## 2010-05-14 ENCOUNTER — Ambulatory Visit: Payer: Self-pay | Admitting: Pulmonary Disease

## 2010-05-14 DIAGNOSIS — G4737 Central sleep apnea in conditions classified elsewhere: Secondary | ICD-10-CM | POA: Insufficient documentation

## 2010-05-15 ENCOUNTER — Ambulatory Visit: Payer: Self-pay | Admitting: Internal Medicine

## 2010-05-15 DIAGNOSIS — R7309 Other abnormal glucose: Secondary | ICD-10-CM

## 2010-05-15 HISTORY — DX: Other abnormal glucose: R73.09

## 2010-05-16 LAB — CONVERTED CEMR LAB
BUN: 18 mg/dL (ref 6–23)
Bilirubin Urine: NEGATIVE
CO2: 29 meq/L (ref 19–32)
Calcium: 9.1 mg/dL (ref 8.4–10.5)
Chloride: 102 meq/L (ref 96–112)
Creatinine, Ser: 0.7 mg/dL (ref 0.4–1.2)
GFR calc non Af Amer: 93.17 mL/min (ref >60)
Glucose, Bld: 90 mg/dL (ref 70–99)
Hemoglobin, Urine: NEGATIVE
Ketones, ur: NEGATIVE mg/dL
Leukocytes, UA: NEGATIVE
Nitrite: NEGATIVE
Potassium: 3.8 meq/L (ref 3.5–5.1)
Sodium: 139 meq/L (ref 135–145)
Specific Gravity, Urine: >=1.03 (ref 1.000–1.030)
TSH: 0.78 microintl units/mL (ref 0.35–5.50)
Total Protein, Urine: NEGATIVE mg/dL
Urine Glucose: NEGATIVE mg/dL
Urobilinogen, UA: 0.2 (ref 0.0–1.0)
pH: 5.5 (ref 5.0–8.0)

## 2010-06-13 ENCOUNTER — Telehealth: Payer: Self-pay | Admitting: Internal Medicine

## 2010-06-17 ENCOUNTER — Ambulatory Visit: Payer: Self-pay | Admitting: Internal Medicine

## 2010-06-17 DIAGNOSIS — M25559 Pain in unspecified hip: Secondary | ICD-10-CM | POA: Insufficient documentation

## 2010-07-16 ENCOUNTER — Ambulatory Visit: Admit: 2010-07-16 | Payer: Self-pay | Admitting: Pulmonary Disease

## 2010-07-19 ENCOUNTER — Encounter: Payer: Self-pay | Admitting: Pulmonary Disease

## 2010-07-29 ENCOUNTER — Encounter: Payer: Self-pay | Admitting: Internal Medicine

## 2010-07-30 ENCOUNTER — Encounter
Admission: RE | Admit: 2010-07-30 | Discharge: 2010-07-30 | Payer: Self-pay | Source: Home / Self Care | Attending: Internal Medicine | Admitting: Internal Medicine

## 2010-07-30 ENCOUNTER — Encounter: Payer: Self-pay | Admitting: Pulmonary Disease

## 2010-07-30 LAB — HM MAMMOGRAPHY: HM Mammogram: NEGATIVE

## 2010-08-04 ENCOUNTER — Encounter: Payer: Self-pay | Admitting: Neurosurgery

## 2010-08-15 NOTE — Assessment & Plan Note (Signed)
Summary: 2 MTH FU  / SIDE PAIN  /NWS   Vital Signs:  Patient profile:   65 year old female Height:      62 inches Weight:      197.75 pounds BMI:     36.30 O2 Sat:      97 % on Room air Temp:     97.8 degrees F oral Pulse rate:   92 / minute BP sitting:   120 / 76  (left arm) Cuff size:   large  Vitals Entered By: Lucious Groves (December 18, 2009 11:01 AM)  O2 Flow:  Room air CC: 2 mo f/u--C/O back pain x2 weeks, and frequent strong urination./kb Is Patient Diabetic? No Pain Assessment Patient in pain? yes     Location: back Intensity: 9 Onset of pain  2 weeks   Primary Care Provider:  Tresa Garter MD  CC:  2 mo f/u--C/O back pain x2 weeks and and frequent strong urination./kb.  History of Present Illness: C/o severe sharp LBP R>L not relieved w/pain medsand urine smells strong C/o  severe CP in the back R>L x 2 wks; SOB x 2 wks, no cough; B legs hurt; HA x 2 wks; R shoulder blade hurts. Not feeling well. C/o abd pain in R flank/R side. C/o OA worse  Current Medications (verified): 1)  Aspirin 81 Mg  Tbec (Aspirin) .... One By Mouth Every Day 2)  Vitamin D3 1000 Unit  Tabs (Cholecalciferol) .Marland Kitchen.. 1 By Mouth Daily 3)  Zolpidem Tartrate 10 Mg  Tabs (Zolpidem Tartrate) .... 1/2 or 1 By Mouth At [facility] 4)  Cymbalta 30 Mg Cpep (Duloxetine Hcl) .Marland Kitchen.. 1 By Mouth Qd 5)  Furosemide 20 Mg Tabs (Furosemide) .... Take 1 Tab By Mouth Every Day As Needed Swelling in Hands 6)  Naproxen 500 Mg Tabs (Naproxen) .Marland Kitchen.. 1 By Mouth Two Times A Day Pc For Pain/arthritis 7)  Hydrocodone-Acetaminophen 10-325 Mg Tabs (Hydrocodone-Acetaminophen) .Marland Kitchen.. 1 By Mouth Two Times A Day By Mouth Two Times A Day As Needed Pain  Allergies (verified): 1)  Zetia (Ezetimibe) 2)  Tramadol Hcl 3)  Wellbutrin (Bupropion Hcl)  Review of Systems       The patient complains of fever, chest pain, dyspnea on exertion, abdominal pain, muscle weakness, and difficulty walking.  The patient denies melena,  hematochezia, and severe indigestion/heartburn.    Physical Exam  General:  NAD, looks tired,  overweight-appearing.   Head:  B TMJ and B temporal areas are not tender. No pulsating vesseles Eyes:  No corneal or conjunctival inflammation noted. EOMI. Perrla. Ears:  External ear exam shows no significant lesions or deformities.  Otoscopic examination reveals clear canals, tympanic membranes are intact bilaterally without bulging, retraction, inflammation or discharge. Hearing is grossly normal bilaterally. Nose:  no airflow obstruction, no intranasal foreign body, no nasal polyps, no nasal mucosal lesions, no mucosal friability, no active bleeding or clots, no sinus percussion tenderness, nasal dischargemucosal pallor, and mucosal edema.   Mouth:  WNL Neck:  No deformities, masses, or tenderness noted. Chest Wall:  NT Lungs:  Normal respiratory effort, chest expands symmetrically. Lungs are clear to auscultation, no crackles or wheezes. Heart:  Normal rate and regular rhythm. S1 and S2 normal without gallop, murmur, click, rub or other extra sounds. Abdomen:  soft, some -tender in epig area, normal bowel sounds, no distention, no masses, no guarding, and no rigidity.   Msk:   B knees are tender w/ROM Lumbar-sacral spine is tender to palpation  over paraspinal muscles and painfull with the ROM  L 4th finger triggers Pulses:  R and L carotid,radial,femoral,dorsalis pedis and posterior tibial pulses are full and equal bilaterally Extremities:  No edema B Neurologic:  No cranial nerve deficits noted. Station and gait are normal. Plantar reflexes are down-going bilaterally. DTRs are symmetrical throughout. Sensory, motor and coordinative functions appear intact. Skin:  turgor normal, color normal, no rashes, no suspicious lesions, no ecchymoses, and no petechiae.   Cervical Nodes:  No lymphadenopathy noted Inguinal Nodes:  No significant adenopathy Psych:  Cognition and judgment appear intact. Alert  and cooperative with normal attention span and concentration. No apparent delusions, illusions, hallucinations. Less depressed   Impression & Recommendations:  Problem # 1:  CHEST PAIN (ICD-786.50) B, R>L Assessment New CT chest Orders: T-2 View CXR, Same Day (71020.5TC) TLB-BMP (Basic Metabolic Panel-BMET) (80048-METABOL) TLB-CBC Platelet - w/Differential (85025-CBCD) TLB-Hepatic/Liver Function Pnl (80076-HEPATIC) TLB-Lipase (83690-LIPASE) TLB-Sedimentation Rate (ESR) (85652-ESR) TLB-Udip ONLY (81003-UDIP)  Problem # 2:  LOW BACK PAIN (ICD-724.2) R Assessment: Deteriorated  Her updated medication list for this problem includes:    Aspirin 81 Mg Tbec (Aspirin) ..... One by mouth every day    Naproxen 500 Mg Tabs (Naproxen) .Marland Kitchen... 1 by mouth two times a day pc for pain/arthritis    Hydrocodone-acetaminophen 10-325 Mg Tabs (Hydrocodone-acetaminophen) .Marland Kitchen... 1 by mouth two times a day by mouth two times a day as needed pain  Orders: TLB-BMP (Basic Metabolic Panel-BMET) (80048-METABOL) TLB-CBC Platelet - w/Differential (85025-CBCD) TLB-Hepatic/Liver Function Pnl (80076-HEPATIC) TLB-Lipase (83690-LIPASE) TLB-Sedimentation Rate (ESR) (85652-ESR) TLB-Udip ONLY (81003-UDIP) Radiology Referral (Radiology)  Problem # 3:  TRIGGER FINGER (ICD-727.03) L 4th Assessment: New Pennsaid Inject if not better  Problem # 4:  ABDOMINAL PAIN, EPIGASTRIC (ICD-789.06) Assessment: New  Orders: Radiology Referral (Radiology) CT chest and abd  Problem # 5:  OSTEOARTHRITIS (ICD-715.90) Assessment: Deteriorated Depo IM see orders Her updated medication list for this problem includes:    Aspirin 81 Mg Tbec (Aspirin) ..... One by mouth every day    Naproxen 500 Mg Tabs (Naproxen) .Marland Kitchen... 1 by mouth two times a day pc for pain/arthritis    Hydrocodone-acetaminophen 10-325 Mg Tabs (Hydrocodone-acetaminophen) .Marland Kitchen... 1 by mouth two times a day by mouth two times a day as needed pain  Complete Medication  List: 1)  Aspirin 81 Mg Tbec (Aspirin) .... One by mouth every day 2)  Vitamin D3 1000 Unit Tabs (Cholecalciferol) .Marland Kitchen.. 1 by mouth daily 3)  Zolpidem Tartrate 10 Mg Tabs (Zolpidem tartrate) .... 1/2 or 1 by mouth at hs prn 4)  Cymbalta 30 Mg Cpep (Duloxetine hcl) .Marland Kitchen.. 1 by mouth qd 5)  Furosemide 20 Mg Tabs (Furosemide) .... Take 1 tab by mouth every day as needed swelling in hands 6)  Naproxen 500 Mg Tabs (Naproxen) .Marland Kitchen.. 1 by mouth two times a day pc for pain/arthritis 7)  Hydrocodone-acetaminophen 10-325 Mg Tabs (Hydrocodone-acetaminophen) .Marland Kitchen.. 1 by mouth two times a day by mouth two times a day as needed pain 8)  Pennsaid 1.5 % Soln (Diclofenac sodium) .... 3-5 gtt on skin three times a day for pain 9)  Ranitidine Hcl 300 Mg Tabs (Ranitidine hcl) .Marland Kitchen.. 1 by mouth once daily for indigestion  Other Orders: Depo- Medrol 80mg  (J1040) Admin of Therapeutic Inj  intramuscular or subcutaneous (81191)  Patient Instructions: 1)  Please schedule a follow-up appointment in 2 weeks. 2)  Call if you are not better in a reasonable amount of time or if worse. Go to ER if feeling really  bad!  Prescriptions: RANITIDINE HCL 300 MG TABS (RANITIDINE HCL) 1 by mouth once daily for indigestion  #90 x 3   Entered and Authorized by:   Tresa Garter MD   Signed by:   Tresa Garter MD on 12/19/2009   Method used:   Print then Give to Patient   RxID:   [id number] PENNSAID 1.5 % SOLN (DICLOFENAC SODIUM) 3-5 gtt on skin three times a day for pain  #1 x 3   Entered and Authorized by:   Tresa Garter MD   Signed by:   Tresa Garter MD on 12/18/2009   Method used:   Print then Give to Patient   RxID:   [id number]    Medication Administration  Injection # 1:    Medication: Depo- Medrol 80mg     Diagnosis: CHEST PAIN (ICD-786.50)    Route: IM    Site: LUOQ gluteus    Exp Date: 10/12/2012    Lot #: [id number]    Mfr: Pharmacia    Patient tolerated injection without  complications    Given by: Lucious Groves (December 18, 2009 11:47 AM)  Orders Added: 1)  Depo- Medrol 80mg  [J1040] 2)  Admin of Therapeutic Inj  intramuscular or subcutaneous [96372] 3)  T-2 View CXR, Same Day [71020.5TC] 4)  TLB-BMP (Basic Metabolic Panel-BMET) [80048-METABOL] 5)  TLB-CBC Platelet - w/Differential [85025-CBCD] 6)  TLB-Hepatic/Liver Function Pnl [80076-HEPATIC] 7)  TLB-Lipase [83690-LIPASE] 8)  TLB-Sedimentation Rate (ESR) [85652-ESR] 9)  TLB-Udip ONLY [81003-UDIP] 10)  Radiology Referral [Radiology] 11)  Est. Patient Level V [84696]

## 2010-08-15 NOTE — Assessment & Plan Note (Signed)
Summary: consult for sleep apnea   Visit Type:  Initial Consult Copy to:  Jacinta Shoe MD Primary Provider/Referring Provider:  Tresa Garter MD  CC:  sleep consult. pt has problems going to sleep and staying asleep.pt c/o getting up multiple times at night. .  History of Present Illness: The pt is a 64y/o female who I have been asked to see for management of sleep apnea.  She underwent npsg in 03/2010, and was found to have clinically significant obstructive and central sleep apnea.  Her AHI was 69/hr, and she had desat as low as 88%.  The pt has been noted to have loud snoring, but the bed partner is unable to comment on an abnormal breathing pattern during sleep.  The pt denies choking arousals, but has very restless and fragmented sleep.  She typically goes to bed at 10-11pm, and arises at 8am to start her day.  She is not rested in the am's upon arising, and often has an early am headache.  She denies EDS with periods of inactivity, watching tv, or driving.  Her epworth score at the time of her sleep study was 0.  She states that her weight has increased about 20 pounds over the last 2 years.  Current Medications (verified): 1)  Aspirin 81 Mg  Tbec (Aspirin) .... One By Mouth Every Day 2)  Vitamin D3 1000 Unit  Tabs (Cholecalciferol) .Marland Kitchen.. 1 By Mouth Daily 3)  Sumatriptan Succinate 100 Mg Tabs (Sumatriptan Succinate) .Marland Kitchen.. 1 By Mouth Once Daily As Needed For Migraine 4)  Premarin 0.625 Mg Tabs (Estrogens Conjugated) .Marland Kitchen.. 1 By Mouth Once Daily For Sweats As Needed 5)  Temazepam 15 Mg Caps (Temazepam) .Marland Kitchen.. 1-2 By Mouth At Bedtime As Needed Insomnia 6)  Furosemide 20 Mg Tabs (Furosemide) .Marland Kitchen.. 1 By Mouth Qam As Needed For Swelling As Needed 7)  Hydrocodone-Acetaminophen 5-500 Mg Tabs (Hydrocodone-Acetaminophen) .Marland Kitchen.. 1-2 By Mouth Two Times A Day As Needed Pain  Allergies: 1)  Zetia (Ezetimibe) 2)  Tramadol Hcl 3)  Wellbutrin (Bupropion Hcl) 4)  Trazodone Hcl (Trazodone Hcl)  Past  History:  Past Medical History: Last updated: 04/10/2010 Osteoarthritis Skin Tag - Left cheek on face , pt anxious about excision. 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  children--5 Alcohol use-no  Review of Systems       The patient complains of shortness of breath with activity, non-productive cough, acid heartburn, weight change, hand/feet swelling, and joint stiffness or pain.  The patient denies shortness of breath at rest, productive cough, coughing up blood, chest pain, irregular heartbeats, indigestion, loss of appetite, abdominal pain, difficulty swallowing, sore throat, tooth/dental problems, headaches, nasal congestion/difficulty breathing through nose, sneezing, itching, ear ache, anxiety, depression, rash, change in color of mucus, and fever.    Vital Signs:  Patient profile:   65 year old female Height:      62 inches Weight:      207.13 pounds BMI:     38.02 O2 Sat:  97 % on Room air Temp:     97.9 degrees F oral Pulse rate:   79 / minute BP sitting:   114 / 74  (left arm) Cuff size:   large  Vitals Entered By: Carver Fila (May 14, 2010 9:28 AM)  O2 Flow:  Room air CC: sleep consult. pt has problems going to sleep and staying asleep.pt c/o getting up multiple times at night.  Comments meds and allergies  updated Phone number updated Carver Fila  May 14, 2010 9:29 AM    Physical Exam  General:  obese female in nad Eyes:  PERRLA and EOMI.   disconjugate gaze right eye toward midline. Nose:  large turbinates, dev. septum to left with near obstruction Mouth:  moderate elongation of soft palate, normal uvula Neck:  no jvd, tmg, LN Lungs:  clear to auscultation Heart:  rrr, no mrg Abdomen:  soft and nontender, bs+ Extremities:  mild LE edema, no cyanosis  pulses intact distally Neurologic:  alert and oriented, moves all 4.   Impression & Recommendations:  Problem # 1:  OBSTRUCTIVE SLEEP APNEA (ICD-327.23) the pt has severe mixed apnea, with the obstructive component representing the primary process.  I have had a long discussion with the pt about sleep apnea, including its impact on QOL and CV health.  She is having frequent awakenings and very fragmented sleep as well, though denies EDS??  At this point, I think she needs to start on cpap while working on weight loss.  She is concerned about smothering, but is willing to try nasal pillows or low profile nasal mask.  She will not try full face mask.  I will set the patient up on cpap at a moderate pressure level to allow for desensitization, and will troubleshoot the device over the next 4-6weeks if needed.  The pt is to call me if having issues with tolerance.  Will then optimize the pressure once patient is able to wear cpap on a consistent basis.  Medications Added to Medication List This Visit: 1)  Premarin 0.625 Mg Tabs (Estrogens conjugated) .Marland Kitchen.. 1 by mouth once daily for sweats as needed  Other Orders: Consultation Level IV ([address]) DME Referral (DME)  Patient Instructions: 1)  will set up on cpap.  Please call if issues with tolerance 2)  work on weight loss 3)  followup with me in 5 weeks.     Appended Document: consult for sleep apnea pt needs ov with me.  See if one is scheduled.  if not, needs one...was supposed to  see me 1/3  Appended Document: consult for sleep apnea LM with family member for pt to call me back.   Appended Document: consult for sleep apnea ATC pt at home # x 3.  Someone picked up the phone and then hung up right away.  Will send pt a letter to call our office to schedule ov with KC.

## 2010-08-15 NOTE — Assessment & Plan Note (Signed)
Summary: vaginal odor SD   Vital Signs:  Patient Profile:   65 Years Old Female Height:     62 inches Weight:      196 pounds BMI:     35.98 Temp:     98.1 degrees F oral Pulse rate:   80 / minute BP sitting:   116 / 76  (left arm) Cuff size:   large  Vitals Entered By: Zackery Barefoot CMA (February 28, 2008 3:40 PM)                 PCP:  Tresa Garter MD  Chief Complaint:  Urinary urgency x weeks/vaginal itching x days/? PAP.  History of Present Illness: Having urgency urgency for several weeks. She had a u/a which was essentially negative but she completed a course of Cipro for five days. She continues to have vaginal itching, she has a sense of bladder pressure but no frank dysuria. She did notice a strong odor which has cleared.     Updated Prior Medication List: CITALOPRAM HYDROBROMIDE 40 MG  TABS (CITALOPRAM HYDROBROMIDE) 1 by mouth qday ASPIRIN 81 MG  TBEC (ASPIRIN) one by mouth every day VITAMIN D3 1000 UNIT  TABS (CHOLECALCIFEROL) 1 by mouth daily MICROZIDE 12.5 MG CAPS (HYDROCHLOROTHIAZIDE) 1 by mouth qam MELOXICAM 15 MG TABS (MELOXICAM) one by mouth daily for arthritis prn TRAMADOL HCL 50 MG  TABS (TRAMADOL HCL) 1 by mouth two times a day as needed pain PREMPRO 0.625-2.5 MG  TABS (CONJ ESTROG-MEDROXYPROGEST ACE) 1 by mouth qd TRAZODONE HCL 50 MG TABS (TRAZODONE HCL) 1.5 or 2 tabs by mouth at bedtime as needed insomnia  Current Allergies: ZETIA (EZETIMIBE)  Past Medical History:    Reviewed history from 02/22/2008 and no changes required:       Osteoarthritis       Skin Tag - Left cheek on face , pt anxious about excision.       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       FLP 169/146/53/87  (10/06)       Non Cardiac Chest Pain - normal Dobutamine and Myoview (last 4/06)       Depression (01/02/2006)       Low back pain       Hypertension (07/15/2004)       COPD       Anxiety (07/15/2004)       Current  Problems:        ESOTROPIA, LEFT EYE (ICD-378.01)       Menopause 2009       TOBACCO ABUSE (ICD-305.1)       FIBROMYALGIA (ICD-729.1)                Past Surgical History:    Reviewed history from 11/28/2006 and no changes required:       Cholecystectomy     Review of Systems       The patient complains of hemoptysis and incontinence.  The patient denies anorexia, fever, weight loss, weight gain, vision loss, decreased hearing, hoarseness, chest pain, syncope, dyspnea on exertion, peripheral edema, prolonged cough, headaches, genital sores, muscle weakness, difficulty walking, and angioedema.     Physical Exam  General:     overweight white female NAD Head:     Normocephalic and atraumatic without obvious abnormalities. No apparent alopecia or balding. Eyes:     pupils round, corneas and lenses clear, and no injection.   Neck:  No deformities, masses, or tenderness noted. Lungs:     normal respiratory effort.   Heart:     normal rate.   Abdomen:     BS + x 4 quadrants. Tender in the epigastrum to deep palpation. Diffuse lower quadrant tenderness  but much worse in the suprapubic region. Genitalia:     Pelvic Exam:        External: normal female genitalia without lesions or masses        Vagina: normal without lesions or masses        Cervix: normal without lesions or masses, retroflexed        Adnexa: normal bimanual exam without masses or fullness        Uterus: normal by palpation        Pap smear: performed Msk:     No deformity or scoliosis noted of thoracic or lumbar spine.      Impression & Recommendations:  Problem # 1:  UTI (ICD-599.0) symptomss of a possible persistent UTI.  Plan: macrobid two times a day x 7 days. When she is done with antibiotic she is to take diflucan stat 150mg  x 1. Her updated medication list for this problem includes:    Macrodantin 100 Mg Caps (Nitrofurantoin macrocrystal) .Marland Kitchen... 1 by mouth bide x 7   Problem # 2:   Screening Cervical Cancer (ICD-V76.2) patient requested a PAP smear to be done today as daughters insistence. She has not had an exam for many years.  Complete Medication List: 1)  Citalopram Hydrobromide 40 Mg Tabs (Citalopram hydrobromide) .Marland Kitchen.. 1 by mouth qday 2)  Aspirin 81 Mg Tbec (Aspirin) .... One by mouth every day 3)  Vitamin D3 1000 Unit Tabs (Cholecalciferol) .Marland Kitchen.. 1 by mouth daily 4)  Microzide 12.5 Mg Caps (Hydrochlorothiazide) .Marland Kitchen.. 1 by mouth qam 5)  Meloxicam 15 Mg Tabs (Meloxicam) .... One by mouth daily for arthritis prn 6)  Tramadol Hcl 50 Mg Tabs (Tramadol hcl) .Marland Kitchen.. 1 by mouth two times a day as needed pain 7)  Prempro 0.625-2.5 Mg Tabs (Conj estrog-medroxyprogest ace) .Marland Kitchen.. 1 by mouth qd 8)  Trazodone Hcl 50 Mg Tabs (Trazodone hcl) .... 1.5 or 2 tabs by mouth at bedtime as needed insomnia 9)  Macrodantin 100 Mg Caps (Nitrofurantoin macrocrystal) .Marland Kitchen.. 1 by mouth bide x 7 10)  Fluconazole 150 Mg Tabs (Fluconazole) .Marland Kitchen.. 1 tab x 1 after completing antibiotics for uti    Prescriptions: FLUCONAZOLE 150 MG  TABS (FLUCONAZOLE) 1 tab x 1 after completing antibiotics for UTI  #1 x 1   Entered and Authorized by:   Jacques Navy MD   Signed by:   Jacques Navy MD on 02/28/2008   Method used:   Electronically sent to ...       [facility] [id number]       [address]       [place], [place]  [address]       Ph: [phone]       Fax: [phone]   RxID:   [id number] MACRODANTIN 100 MG  CAPS (NITROFURANTOIN MACROCRYSTAL) 1 by mouth bide x 7  #14 x 0   Entered and Authorized by:   Jacques Navy MD   Signed by:   Jacques Navy MD on 02/28/2008   Method used:   Electronically sent to ...       [facility] [address] [phone]*  [address]       [place], [place]  [address]       Ph: [phone]       Fax: [phone]   RxID:   [id number]  ]

## 2010-08-15 NOTE — Assessment & Plan Note (Signed)
Summary: PER PT D/T--R EAR ACHE  STC   Vital Signs:  Patient profile:   65 year old female Height:      62 inches Weight:      195 pounds BMI:     35.79 O2 Sat:      97 % on Room air Temp:     98.6 degrees F oral Pulse rate:   83 / minute BP sitting:   130 / 80  (left arm) Cuff size:   regular  Vitals Entered By: Lucious Groves (October 31, 2009 10:31 AM)  O2 Flow:  Room air CC: C/O bilateral shoulder pain down to her hands, RLQ abd pain, and increased urine frequency./kb Is Patient Diabetic? No Pain Assessment Patient in pain? yes     Location: upper torso Intensity: 7 Onset of pain  1 month ago   Primary Care Provider:  Tresa Garter MD  CC:  C/O bilateral shoulder pain down to her hands, RLQ abd pain, and and increased urine frequency./kb.  History of Present Illness: B shoulders hurt, hands are stiff and LS hurts on L x 1 wk  Current Medications (verified): 1)  Aspirin 81 Mg  Tbec (Aspirin) .... One By Mouth Every Day 2)  Vitamin D3 1000 Unit  Tabs (Cholecalciferol) .Marland Kitchen.. 1 By Mouth Daily 3)  Zolpidem Tartrate 10 Mg  Tabs (Zolpidem Tartrate) .... 1/2 or 1 By Mouth [facility] Prn 4)  Cymbalta 30 Mg Cpep (Duloxetine Hcl) .Marland Kitchen.. 1 By Mouth Qd 5)  Furosemide 20 Mg Tabs (Furosemide) .... Take 1 Tab By Mouth Every Day As Needed Swelling in Hands 6)  Naproxen 500 Mg Tabs (Naproxen) .Marland Kitchen.. 1 By Mouth Two Times A Day Pc For Pain/arthritis 7)  Hydrocodone-Acetaminophen 10-325 Mg Tabs (Hydrocodone-Acetaminophen) .Marland Kitchen.. 1 By Mouth Two Times A Day By Mouth Two Times A Day As Needed Pain  Allergies (verified): 1)  Zetia (Ezetimibe) 2)  Tramadol Hcl 3)  Wellbutrin (Bupropion Hcl)  Past History:  Past Medical History: Last updated: 07/11/2009 Osteoarthritis Skin Tag - Left cheek on face , pt anxious about excision. 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 FLP 169/146/53/87  (10/06) Non Cardiac Chest Pain - normal Dobutamine and Myoview (last  4/06) Depression (01/02/2006) Low back pain Hypertension (07/15/2004) COPD Anxiety (07/15/2004) Current Problems:  ESOTROPIA, LEFT EYE (ICD-378.01) Insomnia Menopause 2009 TOBACCO ABUSE (ICD-305.1) FIBROMYALGIA (ICD-729.1) Poss periph neuropathy LE of unknown etiol 2010 Migraines  Social History: Last updated: 01/18/2008 Retired Married Current Smoker 1 ppd Alcohol use-no  Review of Systems       The patient complains of difficulty walking.  The patient denies fever, abdominal pain, melena, hematuria, and angioedema.    Physical Exam  General:  NAD,  overweight-appearing.   Nose:  no airflow obstruction, no intranasal foreign body, no nasal polyps, no nasal mucosal lesions, no mucosal friability, no active bleeding or clots, no sinus percussion tenderness, nasal dischargemucosal pallor, and mucosal edema.   Mouth:  WNL Lungs:  Normal respiratory effort, chest expands symmetrically. Lungs are clear to auscultation, no crackles or wheezes. Heart:  Normal rate and regular rhythm. S1 and S2 normal without gallop, murmur, click, rub or other extra sounds. Abdomen:  soft, non-tender, normal bowel sounds, no distention, no masses, no guarding, and no rigidity.   Msk:   B knees are tender w/ROM Lumbar-sacral spine is tender to palpation over paraspinal muscles and painfull with the ROM  Extremities:  No edema B Neurologic:  No cranial nerve  deficits noted. Station and gait are normal. Plantar reflexes are down-going bilaterally. DTRs are symmetrical throughout. Sensory, motor and coordinative functions appear intact. Skin:  turgor normal, color normal, no rashes, no suspicious lesions, no ecchymoses, and no petechiae.   Psych:  Cognition and judgment appear intact. Alert and cooperative with normal attention span and concentration. No apparent delusions, illusions, hallucinations. Less depressed   Impression & Recommendations:  Problem # 1:  ARTHRALGIA (ICD-719.40) Assessment  Deteriorated  OA vs PMR vs other  Orders: Depo- Medrol 80mg  (J1040) Admin of Therapeutic Inj  intramuscular or subcutaneous ([address])  Problem # 2:  UTI (ICD-599.0) Assessment: New  Her updated medication list for this problem includes:    Ciprofloxacin Hcl 500 Mg Tabs (Ciprofloxacin hcl) .Marland Kitchen... 1 by mouth bid  Problem # 3:  FIBROMYALGIA (ICD-729.1) Assessment: Unchanged  Her updated medication list for this problem includes:    Aspirin 81 Mg Tbec (Aspirin) ..... One by mouth every day    Naproxen 500 Mg Tabs (Naproxen) .Marland Kitchen... 1 by mouth two times a day pc for pain/arthritis    Hydrocodone-acetaminophen 10-325 Mg Tabs (Hydrocodone-acetaminophen) .Marland Kitchen... 1 by mouth two times a day by mouth two times a day as needed pain  Orders: Depo- Medrol 40mg  (J1030)  Problem # 4:  LOW BACK PAIN (ICD-724.2) Assessment: Deteriorated  Her updated medication list for this problem includes:    Aspirin 81 Mg Tbec (Aspirin) ..... One by mouth every day    Naproxen 500 Mg Tabs (Naproxen) .Marland Kitchen... 1 by mouth two times a day pc for pain/arthritis    Hydrocodone-acetaminophen 10-325 Mg Tabs (Hydrocodone-acetaminophen) .Marland Kitchen... 1 by mouth two times a day by mouth two times a day as needed pain  Orders: TLB-Udip ONLY (81003-UDIP)  Complete Medication List: 1)  Aspirin 81 Mg Tbec (Aspirin) .... One by mouth every day 2)  Vitamin D3 1000 Unit Tabs (Cholecalciferol) .Marland Kitchen.. 1 by mouth daily 3)  Zolpidem Tartrate 10 Mg Tabs (Zolpidem tartrate) .... 1/2 or 1 by mouth at hs prn 4)  Cymbalta 30 Mg Cpep (Duloxetine hcl) .Marland Kitchen.. 1 by mouth qd 5)  Furosemide 20 Mg Tabs (Furosemide) .... Take 1 tab by mouth every day as needed swelling in hands 6)  Naproxen 500 Mg Tabs (Naproxen) .Marland Kitchen.. 1 by mouth two times a day pc for pain/arthritis 7)  Hydrocodone-acetaminophen 10-325 Mg Tabs (Hydrocodone-acetaminophen) .Marland Kitchen.. 1 by mouth two times a day by mouth two times a day as needed pain 8)  Ciprofloxacin Hcl 500 Mg Tabs (Ciprofloxacin  hcl) .Marland Kitchen.. 1 by mouth bid  Patient Instructions: 1)  Please schedule a follow-up appointment in 2 months. 2)  Call if you are not better in a reasonable amount of time or if worse.  Prescriptions: CIPROFLOXACIN HCL 500 MG TABS (CIPROFLOXACIN HCL) 1 by mouth bid  #20 x 1   Entered and Authorized by:   Tresa Garter MD   Signed by:   Tresa Garter MD on 10/31/2009   Method used:   Electronically to        [facility] [id number] (retail)       [address] [place], [place]  [address]       Ph: [phone]       Fax: [phone]   RxID:   [id number]    Medication Administration  Injection # 1:    Medication: Depo- Medrol 80mg     Diagnosis: ARTHRALGIA (  ICD-719.40)    Route: IM    Site: LUOQ gluteus    Exp Date: 05/14/2012    Lot #: [id number]    Mfr: [employer]    Comments: Patient received'd total of 120mg  depo.    Patient tolerated injection without complications    Given by: Lucious Groves (October 31, 2009 11:06 AM)  Injection # 2:    Medication: Depo- Medrol 40mg     Diagnosis: ARTHRALGIA (ICD-719.40)    Route: IM    Site: LUOQ gluteus    Exp Date: 05/14/2012    Lot #: [id number]    Mfr: [employer]  Orders Added: 1)  Depo- Medrol 80mg  [J1040] 2)  Admin of Therapeutic Inj  intramuscular or subcutaneous [96372] 3)  TLB-Udip ONLY [81003-UDIP] 4)  Depo- Medrol 40mg  [J1030] 5)  Est. Patient Level IV [82956]

## 2010-08-15 NOTE — Progress Notes (Signed)
  Phone Note Other Incoming   Caller: pt  Summary of Call: Pt called stating that she accidentally took two of her cymbalta pills today. I informed her that she just needs to remember when she takes her pill to prevent this from happening again. OK? Initial call taken by: Ami Bullins CMA,  July 02, 2009 3:36 PM  Follow-up for Phone Call        Getting a pill box my help too... Thx Follow-up by: Tresa Garter MD,  July 03, 2009 10:56 AM

## 2010-08-15 NOTE — Letter (Signed)
   [facility] [address] [place], [place]  [address] Phone: [phone]      March 07, 2008   Lilya Lantier [address] [place], [place] [address]  RE:  LAB RESULTS  Dear  Ms. Shimer,  The following is an interpretation of your most recent lab tests.  Please take note of any instructions provided or changes to medications that have resulted from your lab work.  Pap Smear: normal     Normal PAP.  Call or e-mail me if you have questions ([email or web address]).   Sincerely Yours,    Jacques Navy MD  Appended Document:  Mailed copy of letter to pt per MD.

## 2010-08-15 NOTE — Assessment & Plan Note (Signed)
Summary: 1 MO ROV / NWS  #   Vital Signs:  Patient profile:   65 year old female Height:      62 inches Weight:      206 pounds BMI:     37.81 Temp:     98.3 degrees F oral Pulse rate:   88 / minute Pulse rhythm:   regular Resp:     16 per minute BP sitting:   120 / 78  (left arm) Cuff size:   large  Vitals Entered By: Lanier Prude, CMA(AAMA) (April 10, 2010 10:09 AM) CC: 1 mo f/u Is Patient Diabetic? No   Primary Care Provider:  Tresa Garter MD  CC:  1 mo f/u.  History of Present Illness: The patient presents for a follow up of CP, SOB, OA, back pain, anxiety, depression. CL was nl. She stopped Peps[employer] and strarted to drink juces. She went to ER - they gave her Vicodin - it helped  Current Medications (verified): 1)  Aspirin 81 Mg  Tbec (Aspirin) .... One By Mouth Every Day 2)  Vitamin D3 1000 Unit  Tabs (Cholecalciferol) .Marland Kitchen.. 1 By Mouth Daily 3)  Hydrocodone-Acetaminophen 10-325 Mg Tabs (Hydrocodone-Acetaminophen) .Marland Kitchen.. 1 By Mouth Two Times A Day By Mouth Two Times A Day As Needed Pain 4)  Pennsaid 1.5 % Soln (Diclofenac Sodium) .... 3-5 Gtt On Skin Three Times A Day For Pain 5)  Ranitidine Hcl 300 Mg Tabs (Ranitidine Hcl) .Marland Kitchen.. 1 By Mouth Once Daily For Indigestion 6)  Sumatriptan Succinate 100 Mg Tabs (Sumatriptan Succinate) .Marland Kitchen.. 1 By Mouth Once Daily As Needed For Migraine 7)  Premarin 0.625 Mg Tabs (Estrogens Conjugated) .Marland Kitchen.. 1 By Mouth Once Daily For Sweats 8)  Temazepam 15 Mg Caps (Temazepam) .Marland Kitchen.. 1-2 By Mouth At Bedtime As Needed Insomnia 9)  Furosemide 20 Mg Tabs (Furosemide) .Marland Kitchen.. 1 By Mouth Qam As Needed For Swelling As Needed 10)  Klor-Con M10 10 Meq Cr-Tabs (Potassium Chloride Crys Cr) .Marland Kitchen.. 1 By Mouth Qd  Allergies (verified): 1)  Zetia (Ezetimibe) 2)  Tramadol Hcl 3)  Wellbutrin (Bupropion Hcl) 4)  Trazodone Hcl (Trazodone Hcl)  Past History:  Social History: Last updated: 01/18/2008 Retired Married Current Smoker 1 ppd Alcohol  use-no  Past Medical History: Osteoarthritis Skin Tag - Left cheek on face , pt anxious about excision. 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 FLP 169/146/53/87  (10/06)   Non Cardiac Chest Pain - normal Dobutamine and Myoview (last 4/06) Depression (01/02/2006) Low back pain Hypertension (07/15/2004) COPD Anxiety (07/15/2004) ESOTROPIA, LEFT EYE (ICD-378.01) Insomnia Menopause 2009 TOBACCO ABUSE (ICD-305.1) FIBROMYALGIA (ICD-729.1) Poss periph neuropathy LE of unknown etiol 2010 Migraines Psychosomatic issues 2011  Physical Exam  General:  NAD overweight-appearing.   Mouth:  WNL Lungs:  Normal respiratory effort, chest expands symmetrically. Lungs are clear to auscultation, no crackles or wheezes. Heart:  Normal rate and regular rhythm. S1 and S2 normal without gallop, murmur, click, rub or other extra sounds. Abdomen:  soft, some -tender in epig area, normal bowel sounds, no distention, no masses, no guarding, and no rigidity.   Msk:  Lumbar-sacral spine is tender to palpation over paraspinal muscles and painfull with the ROM   Neurologic:  alert & oriented X3 and cranial nerves II-XII symetrically intact.  strength normal in all extremities, sensation intact to light touch, and gait normal. speech fluent without dysarthria or aphasia; follows commands with good comprehension.    Impression & Recommendations:  Problem # 1:  CHEST PAIN (ICD-786.50) Assessment Improved CL was nl  Problem # 2:  DYSPNEA (ICD-786.05) deconditioning Assessment: Unchanged  Problem # 3:  HEADACHE (ICD-784.0) Assessment: Unchanged  The following medications were removed from the medication list:    Hydrocodone-acetaminophen 10-325 Mg Tabs (Hydrocodone-acetaminophen) .Marland Kitchen... 1 by mouth two times a day by mouth two times a day as needed pain Her updated medication list for this problem includes:    Aspirin 81 Mg Tbec (Aspirin) ..... One by mouth every day     Sumatriptan Succinate 100 Mg Tabs (Sumatriptan succinate) .Marland Kitchen... 1 by mouth once daily as needed for migraine    Hydrocodone-acetaminophen 5-500 Mg Tabs (Hydrocodone-acetaminophen) .Marland Kitchen... 1-2 by mouth two times a day as needed pain  Problem # 4:  SOMATIZATION DISORDER (ICD-300.81) Assessment: Unchanged Discussed wt loss, diet and exercise  Problem # 5:  OBESITY (ICD-278.00) Assessment: Deteriorated Discussed diet Drink water,  not juce  Problem # 6:  FIBROMYALGIA (ICD-729.1) Assessment: Unchanged  The following medications were removed from the medication list:    Hydrocodone-acetaminophen 10-325 Mg Tabs (Hydrocodone-acetaminophen) .Marland Kitchen... 1 by mouth two times a day by mouth two times a day as needed pain Her updated medication list for this problem includes:    Aspirin 81 Mg Tbec (Aspirin) ..... One by mouth every day    Hydrocodone-acetaminophen 5-500 Mg Tabs (Hydrocodone-acetaminophen) .Marland Kitchen... 1-2 by mouth two times a day as needed pain  Complete Medication List: 1)  Aspirin 81 Mg Tbec (Aspirin) .... One by mouth every day 2)  Vitamin D3 1000 Unit Tabs (Cholecalciferol) .Marland Kitchen.. 1 by mouth daily 3)  Pennsaid 1.5 % Soln (Diclofenac sodium) .... 3-5 gtt on skin three times a day for pain 4)  Ranitidine Hcl 300 Mg Tabs (Ranitidine hcl) .Marland Kitchen.. 1 by mouth once daily for indigestion 5)  Sumatriptan Succinate 100 Mg Tabs (Sumatriptan succinate) .Marland Kitchen.. 1 by mouth once daily as needed for migraine 6)  Premarin 0.625 Mg Tabs (Estrogens conjugated) .Marland Kitchen.. 1 by mouth once daily for sweats 7)  Temazepam 15 Mg Caps (Temazepam) .Marland Kitchen.. 1-2 by mouth at bedtime as needed insomnia 8)  Furosemide 20 Mg Tabs (Furosemide) .Marland Kitchen.. 1 by mouth qam as needed for swelling as needed 9)  Klor-con M10 10 Meq Cr-tabs (Potassium chloride crys cr) .Marland Kitchen.. 1 by mouth qd 10)  Hydrocodone-acetaminophen 5-500 Mg Tabs (Hydrocodone-acetaminophen) .Marland Kitchen.. 1-2 by mouth two times a day as needed pain  Patient Instructions: 1)  Please schedule a  follow-up appointment in 3 months.  Contraindications/Deferment of Procedures/Staging:    Test/Procedure: FLU VAX    Reason for deferment: patient declined  Prescriptions: HYDROCODONE-ACETAMINOPHEN 5-500 MG TABS (HYDROCODONE-ACETAMINOPHEN) 1-2 by mouth two times a day as needed pain  #60 x 1   Entered and Authorized by:   Tresa Garter MD   Signed by:   Tresa Garter MD on 04/10/2010   Method used:   Print then Give to Patient   RxID:   [id number]

## 2010-08-15 NOTE — Assessment & Plan Note (Signed)
Summary: FU-BACK PAIN-$50-STC   Vital Signs:  Patient Profile:   65 Years Old Female Weight:      201 pounds Temp:     97.7 degrees F oral Pulse rate:   74 / minute BP sitting:   128 / 82  (left arm)  Vitals Entered By: Tora Perches (Nov 19, 2007 9:20 AM)             Is Patient Diabetic? No     Chief Complaint:  back pain.  History of Present Illness: C/o feet burn like fire x 2 m0. C/o drinking and peeing a lot. F/u elev.chol.C/o arthralgias all over.    Current Allergies: No known allergies   Past Medical History:    Reviewed history from 11/28/2006 and no changes required:       Osteoarthritis       Skin Tag - Left cheek on face , pt anxious about excision.       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       FLP 169/146/53/87  (10/06)       Non Cardiac Chest Pain - normal Dobutamine and Myoview (last 4/06)       Depression (01/02/2006)       Low back pain       Hypertension (07/15/2004)       COPD       Anxiety (07/15/2004)       Current Problems:        ESOTROPIA, LEFT EYE (ICD-378.01)       TOBACCO ABUSE (ICD-305.1)       FIBROMYALGIA (ICD-729.1)                Past Surgical History:    Reviewed history from 11/28/2006 and no changes required:       Cholecystectomy   Family History:    Reviewed history and no changes required:       Family History Hypertension       No DM  Social History:    Reviewed history and no changes required:       Retired       Married       Current Smoker   Risk Factors:  Tobacco use:  current   Review of Systems       The patient complains of weight gain and difficulty walking.  The patient denies chest pain, syncope, peripheral edema, prolonged cough, abdominal pain, transient blindness, and melena.         LBP   Physical Exam  General:     overweight-appearing.   Head:     Normocephalic and atraumatic without obvious abnormalities. No apparent alopecia or  balding. Eyes:     No corneal or conjunctival inflammation noted. EOMI. Perrla. Funduscopic exam benign, without hemorrhages, exudates or papilledema. Vision grossly normal. Ears:     External ear exam shows no significant lesions or deformities.  Otoscopic examination reveals clear canals, tympanic membranes are intact bilaterally without bulging, retraction, inflammation or discharge. Hearing is grossly normal bilaterally. Nose:     External nasal examination shows no deformity or inflammation. Nasal mucosa are pink and moist without lesions or exudates. Mouth:     Oral mucosa and oropharynx without lesions or exudates.  Teeth in good repair. Neck:     No deformities, masses, or tenderness noted. Lungs:     Normal respiratory effort, chest expands symmetrically. Lungs are clear to auscultation, no  crackles or wheezes. Heart:     Normal rate and regular rhythm. S1 and S2 normal without gallop, murmur, click, rub or other extra sounds. Abdomen:     Bowel sounds positive,abdomen soft and non-tender without masses, organomegaly or hernias noted. Msk:     Lumbar-sacral spine is tender to palpation over paraspinal muscles and painfull with the ROM Feet ok Pulses:     R and L carotid,radial,femoral,dorsalis pedis and posterior tibial pulses are full and equal bilaterally Extremities:     No clubbing, cyanosis, edema, or deformity noted with normal full range of motion of all joints.   Neurologic:     No cranial nerve deficits noted. Station and gait are normal. Plantar reflexes are down-going bilaterally. DTRs are symmetrical throughout. Sensory, motor and coordinative functions appear intact. Skin:     no rashes.   Psych:     Oriented X3, good eye contact, and not anxious appearing.  C/o stress    Impression & Recommendations:  Problem # 1:  PARESTHESIA (ICD-782.0) LE Assessment: New Darvocet Orders: TLB-CBC Platelet - w/Differential (85025-CBCD) TLB-Hepatic/Liver Function Pnl  (80076-HEPATIC) TLB-TSH (Thyroid Stimulating Hormone) (84443-TSH) TLB-Udip ONLY (81003-UDIP) TLB-B12, Serum-Total ONLY ([address]-U04)   Problem # 2:  ARTHRALGIA (ICD-719.40) Assessment: Deteriorated D/c Simvastatin Orders: TLB-B12, Serum-Total ONLY (54098-J19)   Problem # 3:  TOBACCO ABUSE (ICD-305.1) Assessment: Unchanged Encouraged smoking cessation and discussed different methods for smoking cessation.  Orders: Tobacco use cessation intermediate 3-10 minutes (14782)   Problem # 4:  DEPRESSION (ICD-311) Assessment: Unchanged  The following medications were removed from the medication list:    Xanax 0.5 Mg Tabs (Alprazolam) .Marland Kitchen... Take one tablet 2-3 times a day as required.    Amitriptyline Hcl 50 Mg Tabs (Amitriptyline hcl) .Marland Kitchen... 1 by mouth qhs  Her updated medication list for this problem includes:    Citalopram Hydrobromide 40 Mg Tabs (Citalopram hydrobromide) .Marland Kitchen... 1 by mouth qday   Problem # 5:  HYPERTENSION (ICD-401.9) Assessment: Improved  The following medications were removed from the medication list:    Maxzide-25 37.5-25 Mg Tabs (Triamterene-hctz) .Marland Kitchen... Take one tablet by mouth daily.  Her updated medication list for this problem includes:    Atenolol 50 Mg Tabs (Atenolol) .Marland Kitchen... Take one tablet by mouth daily.   Problem # 6:  LOW BACK PAIN (ICD-724.2) Assessment: Unchanged  The following medications were removed from the medication list:    Tylox 5-500 Mg Caps (Oxycodone-acetaminophen) .Marland Kitchen... Take one tablet by mouth as needed.  Her updated medication list for this problem includes:    Darvocet-n 100 100-650 Mg Tabs (Propoxyphene n-apap) .Marland Kitchen... 1po bid as needed by mouth   Problem # 7:  POLYURIA (NFA-213.08) Assessment: Deteriorated D/c diuretic Orders: TLB-A1C / Hgb A1C (Glycohemoglobin) (83036-A1C) TLB-BMP (Basic Metabolic Panel-BMET) (80048-METABOL)   Complete Medication List: 1)  Atenolol 50 Mg Tabs (Atenolol) .... Take one tablet by mouth  daily. 2)  Citalopram Hydrobromide 40 Mg Tabs (Citalopram hydrobromide) .Marland Kitchen.. 1 by mouth qday 3)  Darvocet-n 100 100-650 Mg Tabs (Propoxyphene n-apap) .Marland Kitchen.. 1po bid as needed by mouth 4)  Aspirin 81 Mg Tbec (Aspirin) .... One by mouth every day 5)  Vitamin D3 1000 Unit Tabs (Cholecalciferol) .Marland Kitchen.. 1 by mouth daily 6)  Ceftin 250 Mg Tabs (Cefuroxime axetil) .... Take one tablet by mouth twice a day for cystitis   Patient Instructions: 1)  Please schedule a follow-up appointment in 2 months. 2)  Stop Simvastatin and Triamt/HCTZ   Prescriptions: CEFTIN 250 MG  TABS (CEFUROXIME AXETIL) Take  one tablet by mouth twice a day for cystitis  #10 x 0   Entered and Authorized by:   Tresa Garter MD   Signed by:   Tresa Garter MD on 11/19/2007   Method used:   Electronically sent to ...       [facility] [id number]       [address] [place], [place]  [address]       Ph: [phone]       Fax: [phone]   RxID:   [id number] DARVOCET-N 100 100-650 MG  TABS (PROPOXYPHENE N-APAP) 1po bid as needed by mouth  #60 x 2   Entered and Authorized by:   Tresa Garter MD   Signed by:   Tresa Garter MD on 11/19/2007   Method used:   Print then Give to Patient   RxID:   [id number]  ]

## 2010-08-15 NOTE — Assessment & Plan Note (Signed)
Summary: Cardiology Nuclear Testing  Nuclear Med Background Indications for Stress Test: Evaluation for Ischemia   History: COPD, Heart Catheterization   Symptoms: Chest Pain, Fatigue, SOB    Nuclear Pre-Procedure Cardiac Risk Factors: Hypertension, Smoker Caffeine/Decaff Intake: none NPO After: 8:30 PM Lungs: clear IV 0.9% NS with Angio Cath: 22g     IV Site: R Hand IV Started by: Cathlyn Parsons, RN Chest Size (in) 40     Cup Size B     Height (in): 62 Weight (lb): 201 BMI: 36.90  Nuclear Med Study 1 or 2 day study:  1 day     Stress Test Type:  Treadmill/Lexiscan Reading MD:  Marca Ancona, MD     Referring MD:  A. Plotnikov Resting Radionuclide:  Technetium 77m Tetrofosmin     Resting Radionuclide Dose:  11 mCi  Stress Radionuclide:  Technetium 67m Tetrofosmin     Stress Radionuclide Dose:  33 mCi   Stress Protocol Exercise Time (min):  2:34 min     Max HR:  139 bpm     Predicted Max HR:  159 bpm  Max Systolic BP: 142 mm Hg     Percent Max HR:  87.42 %     METS: 4.6 Rate Pressure Product:  81191  Lexiscan: 0.4 mg   Stress Test Technologist:  Milana Na, EMT-P     Nuclear Technologist:  Domenic Polite, CNMT  Rest Procedure  Myocardial perfusion imaging was performed at rest 45 minutes following the intravenous administration of Technetium 34m Tetrofosmin.  Stress Procedure  The patient received IV Lexiscan 0.4 mg over 15-seconds with concurrent low level exercise and then Technetium 44m Tetrofosmin was injected at 30-seconds while the patient continued walking one more minute.  There were no significant changes and rare PVC's with Lexiscan injection.  Quantitative spect images were obtained after a 45 minute delay.  QPS Raw Data Images:  Normal; no motion artifact; normal heart/lung ratio. Stress Images:  Normal homogeneous uptake in all areas of the myocardium. Rest Images:  Normal homogeneous uptake in all areas of the myocardium. Subtraction (SDS):   There is no evidence of scar or ischemia. Transient Ischemic Dilatation:  0.92  (Normal <1.22)  Lung/Heart Ratio:  0.36  (Normal <0.45)  Quantitative Gated Spect Images QGS EDV:  55 ml QGS ESV:  18 ml QGS EF:  67 % QGS cine images:  Normal wall motion.    Overall Impression  Exercise Capacity: Lexiscan with low level exercise BP Response: Normal blood pressure response. Clinical Symptoms: Shortness of breath ECG Impression: No significant ST segment change suggestive of ischemia. Overall Impression: Normal stress nuclear study.

## 2010-08-15 NOTE — Assessment & Plan Note (Signed)
Summary: f/u to prev appt/#/cd   Vital Signs:  Patient profile:   65 year old female Weight:      194 pounds Temp:     98.4 degrees F oral Pulse rate:   91 / minute BP sitting:   126 / 80  (left arm)  Vitals Entered By: Tora Perches (June 06, 2009 9:09 AM) CC: f/u Is Patient Diabetic? No   Primary Care Provider:  Tresa Garter MD  CC:  f/u.  History of Present Illness: The patient presents for a post-hospital ER visit on 11/17 for a severe dull  HA on L now (switched from R). C/o a mild L HA now... No N/V  Current Medications (verified): 1)  Aspirin 81 Mg  Tbec (Aspirin) .... One By Mouth Every Day 2)  Vitamin D3 1000 Unit  Tabs (Cholecalciferol) .Marland Kitchen.. 1 By Mouth Daily 3)  Zolpidem Tartrate 10 Mg  Tabs (Zolpidem Tartrate) .... 1/2 or 1 By Mouth At [facility] Prn 4)  Hydrocodone-Acetaminophen 5-325 Mg Tabs (Hydrocodone-Acetaminophen) .Marland Kitchen.. 1 By Mouth Up To 2 Times Per Day As Needed For Pain 5)  Cymbalta 30 Mg Cpep (Duloxetine Hcl) .Marland Kitchen.. 1 By Mouth Qd 6)  Furosemide 20 Mg Tabs (Furosemide) .... Take 1 Tab By Mouth Every Day As Needed Swelling in Hands 7)  Prempro 0.625-2.5 Mg Tabs (Conj Estrog-Medroxyprogest Ace) .Marland Kitchen.. 1 By Mouth Qd 8)  Promethazine-Dm 6.25-15 Mg/22ml Syrp (Promethazine-Dm) .... 5-10 Ml By Mouth Qid As Needed For Cough 9)  Carbamazepine 200 Mg Tabs (Carbamazepine) .Marland Kitchen.. 1 By Mouth Two Times A Day For Neuralgia 10)  Indomethacin 50 Mg Caps (Indomethacin) .Marland Kitchen.. 1 By Mouth 2  Times Daily With Food As Needed Pain  Allergies: 1)  Zetia (Ezetimibe) 2)  Tramadol Hcl 3)  Wellbutrin (Bupropion Hcl)  Past History:  Past Medical History: Last updated: 04/13/2009 Osteoarthritis Skin Tag - Left cheek on face , pt anxious about excision. 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 FLP 169/146/53/87  (10/06) Non Cardiac Chest Pain - normal Dobutamine and Myoview (last 4/06) Depression (01/02/2006) Low back pain Hypertension  (07/15/2004) COPD Anxiety (07/15/2004) Current Problems:  ESOTROPIA, LEFT EYE (ICD-378.01) Insomnia Menopause 2009 TOBACCO ABUSE (ICD-305.1) FIBROMYALGIA (ICD-729.1) Poss periph neuropathy LE of unknown etiol 2010 Migraines  Social History: Last updated: 01/18/2008 Retired Married Current Smoker 1 ppd Alcohol use-no  Review of Systems  The patient denies fever, dyspnea on exertion, abdominal pain, muscle weakness, suspicious skin lesions, and difficulty walking.    Physical Exam  General:  alert, well-hydrated, appropriate dress, good hygiene, and overweight-appearing.   Head:  B TMJ and B temporal areas are tender. No pulsating vesseles Eyes:  No corneal or conjunctival inflammation noted. EOMI. Perrla. Funduscopic exam benign, without hemorrhages, exudates or papilledema. Vision grossly normal. Ears:  External ear exam shows no significant lesions or deformities.  Otoscopic examination reveals clear canals, tympanic membranes are intact bilaterally without bulging, retraction, inflammation or discharge. Hearing is grossly normal bilaterally. Nose:  no airflow obstruction, no intranasal foreign body, no nasal polyps, no nasal mucosal lesions, no mucosal friability, no active bleeding or clots, no sinus percussion tenderness, nasal dischargemucosal pallor, and mucosal edema.   Mouth:  WNL Neck:  No deformities, masses, or tenderness noted. Chest Wall:  No deformities, masses, or tenderness noted. Lungs:  Normal respiratory effort, chest expands symmetrically. Lungs are clear to auscultation, no crackles or wheezes. Heart:  Normal rate and regular rhythm. S1 and S2 normal without gallop, murmur, click, rub  or other extra sounds. Abdomen:  soft, non-tender, normal bowel sounds, no distention, no masses, no guarding, and no rigidity.   Msk:    Lumbar-sacral spine is tender to palpation over paraspinal muscles and painfull with the ROM  Extremities:  trace left pedal edema and trace  right pedal edema.   Neurologic:  No cranial nerve deficits noted. Station and gait are normal. Plantar reflexes are down-going bilaterally. DTRs are symmetrical throughout. Sensory, motor and coordinative functions appear intact.   Impression & Recommendations:  Problem # 1:  HEADACHE (ICD-784.0) Assessment Deteriorated I'm not sure of the etiology... Doubt giant cell arteritis - ESR=8.  Her updated medication list for this problem includes:    Aspirin 81 Mg Tbec (Aspirin) ..... One by mouth every day    Hydrocodone-acetaminophen 5-325 Mg Tabs (Hydrocodone-acetaminophen) .Marland Kitchen... 1 by mouth up to 2 times per day as needed for pain    Indomethacin 50 Mg Caps (Indomethacin) .Marland Kitchen... 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 One by mouth every day    Hydrocodone-acetaminophen 5-325 Mg Tabs (Hydrocodone-acetaminophen) .Marland Kitchen... 1 by mouth up to 2 times per day as needed for pain    Indomethacin 50 Mg Caps (Indomethacin) .Marland Kitchen... 1 by mouth 2  times daily with food as needed pain  Problem # 4:  DEPRESSION (ICD-311) Assessment: Unchanged  Her updated medication list for this problem includes:    Cymbalta 30 Mg Cpep (Duloxetine hcl) .Marland Kitchen... 1 by mouth qd  Complete Medication List: 1)  Aspirin 81 Mg Tbec (Aspirin) .... One by mouth every day 2)  Vitamin D3 1000 Unit Tabs (Cholecalciferol) .Marland Kitchen.. 1 by mouth daily 3)  Zolpidem Tartrate 10 Mg Tabs (Zolpidem tartrate) .... 1/2 or 1 by mouth at hs prn 4)  Hydrocodone-acetaminophen 5-325 Mg Tabs (Hydrocodone-acetaminophen) .Marland Kitchen.. 1 by mouth up to 2 times per day as needed for pain 5)  Cymbalta 30 Mg  Cpep (Duloxetine hcl) .Marland Kitchen.. 1 by mouth qd 6)  Furosemide 20 Mg Tabs (Furosemide) .... Take 1 tab by mouth every day as needed swelling in hands 7)  Prempro 0.625-2.5 Mg Tabs (Conj estrog-medroxyprogest ace) .Marland Kitchen.. 1 by mouth qd 8)  Promethazine-dm 6.25-15 Mg/54ml Syrp (Promethazine-dm) .... 5-10 ml by mouth qid as needed for cough 9)  Carbamazepine 200 Mg Tabs (Carbamazepine) .Marland Kitchen.. 1 by mouth two times a day for neuralgia 10)  Indomethacin 50 Mg Caps (Indomethacin) .Marland Kitchen.. 1 by mouth 2  times daily with food as needed pain  Patient Instructions: 1)  Please schedule a follow-up appointment in 2 weeks. 2)  BMP prior to visit, ICD-9: 3)  ESR 784.0  995.20 4)  Urine-dip prior to visit, ICD-9:

## 2010-08-15 NOTE — Assessment & Plan Note (Signed)
Summary: 3 MO FU/$50/PN/jss   Vital Signs:  Patient profile:   65 year old female Weight:      197 pounds BMI:     36.16 Temp:     98 degrees F oral Pulse rate:   88 / minute BP sitting:   110 / 76  (left arm)  Vitals Entered By: Tora Perches (Nov 30, 2008 9:38 AM) CC: f/u Is Patient Diabetic? No   Primary Care [place] Dittus:  Tresa Garter MD  CC:  f/u.  History of Present Illness: The patient presents for a follow up of hypertension, FMS, depression, hyperlipidemia   Current Medications (verified): 1)  Microzide 12.5 Mg Caps (Hydrochlorothiazide) .Marland Kitchen.. 1 By Mouth Qam 2)  Aspirin 81 Mg  Tbec (Aspirin) .... One By Mouth Every Day 3)  Vitamin D3 1000 Unit  Tabs (Cholecalciferol) .Marland Kitchen.. 1 By Mouth Daily 4)  Zolpidem Tartrate 10 Mg  Tabs (Zolpidem Tartrate) .... 1/2 or 1 By Mouth At [facility] Prn 5)  Lotrisone 1-0.05 % Crea (Clotrimazole-Betamethasone) .... Use Two Times A Day X 3 Wks 6)  Hydrocodone-Acetaminophen 5-325 Mg Tabs (Hydrocodone-Acetaminophen) .Marland Kitchen.. 1 By Mouth Up To 2 Times Per Day As Needed For Pain 7)  Savella 50 Mg Tabs (Milnacipran Hcl) .Marland Kitchen.. 1 By Mouth Bid 8)  Triamcinolone Acetonide 0.5 % Crea (Triamcinolone Acetonide) .... Apply Bid To Affected Area 9)  Cymbalta 30 Mg Cpep (Duloxetine Hcl) .Marland Kitchen.. 1 By Mouth Qd  Allergies: 1)  Zetia (Ezetimibe) 2)  Tramadol Hcl 3)  Wellbutrin (Bupropion Hcl)  Past History:  Past Medical History:    Osteoarthritis    Skin Tag - Left cheek on face , pt anxious about excision.    Polyuria- CBG 101 (random)    Eye problem- Left eye lazy since childhood.    FLP 169/146/53/87  (10/06)    Non Cardiac Chest Pain - normal Dobutamine and Myoview (last 4/06)    Depression (01/02/2006)    Low back pain    Hypertension (07/15/2004)    COPD    Anxiety (07/15/2004)    Current Problems:     ESOTROPIA, LEFT EYE (ICD-378.01)    Insomnia    Menopause 2009    TOBACCO ABUSE (ICD-305.1)    FIBROMYALGIA (ICD-729.1)     (04/27/2008)  Past  Surgical History:    Cholecystectomy     (11/28/2006)  Family History:    Family History Hypertension    No DM     (11/19/2007)  Social History:    Retired    Married    Current Smoker 1 ppd    Alcohol use-no     (01/18/2008)  Review of Systems  The patient denies chest pain, syncope, and muscle weakness.    Physical Exam  General:  overweight-appearing.   Nose:  External nasal examination shows no deformity or inflammation. Nasal mucosa are pink and moist without lesions or exudates. Mouth:  Oral mucosa and oropharynx without lesions or exudates.  Teeth in good repair. Neck:  No deformities, masses, or tenderness noted. Lungs:  Normal respiratory effort, chest expands symmetrically. Lungs are clear to auscultation, no crackles or wheezes. Heart:  Tachy  S1 and S2 normal without gallop, murmur, click, rub or other extra sounds. Abdomen:  Bowel sounds positive,abdomen soft and non-tender without masses, organomegaly or hernias noted. Msk:  No deformity or scoliosis noted of thoracic or lumbar spine.  Lumbar-sacral spine is tender to palpation over paraspinal muscles and painfull with the ROM  Very stiff... Extremities:  No clubbing,  cyanosis, edema, or deformity noted with normal full range of motion of all joints.   Neurologic:  No cranial nerve deficits noted. Station and gait are normal. Plantar reflexes are down-going bilaterally. DTRs are symmetrical throughout. Sensory, motor and coordinative functions appear intact. Skin:  cold sore on a lip w/crust Psych:  Oriented X3 and depressed affect.     Impression & Recommendations:  Problem # 1:  TACHYCARDIA (ICD-785.0) Assessment Improved On prescription therapy   Problem # 2:  SWEATING (ICD-780.8) Assessment: Improved  Problem # 3:  FATIGUE (ICD-780.79) Assessment: Unchanged  Problem # 4:  ARTHRALGIA (ICD-719.40) Assessment: Improved  Problem # 5:  LOW BACK PAIN (ICD-724.2) Assessment: Unchanged  The following  medications were removed from the medication list:    Meloxicam 15 Mg Tabs (Meloxicam) ..... One by mouth daily for arthritis prn Her updated medication list for this problem includes:    Aspirin 81 Mg Tbec (Aspirin) ..... One by mouth every day    Hydrocodone-acetaminophen 5-325 Mg Tabs (Hydrocodone-acetaminophen) .Marland Kitchen... 1 by mouth up to 2 times per day as needed for pain  Problem # 6:  ANXIETY (ICD-300.00) Assessment: Unchanged  Her updated medication list for this problem includes:    Cymbalta 30 Mg Cpep (Duloxetine hcl) .Marland Kitchen... 1 by mouth qd  Problem # 7:  FIBROMYALGIA (ICD-729.1) On Savella The following medications were removed from the medication list:    Meloxicam 15 Mg Tabs (Meloxicam) ..... One by mouth daily for arthritis prn Her updated medication list for this problem includes:    Aspirin 81 Mg Tbec (Aspirin) ..... One by mouth every day    Hydrocodone-acetaminophen 5-325 Mg Tabs (Hydrocodone-acetaminophen) .Marland Kitchen... 1 by mouth up to 2 times per day as needed for pain  Complete Medication List: 1)  Microzide 12.5 Mg Caps (Hydrochlorothiazide) .Marland Kitchen.. 1 by mouth qam 2)  Aspirin 81 Mg Tbec (Aspirin) .... One by mouth every day 3)  Vitamin D3 1000 Unit Tabs (Cholecalciferol) .Marland Kitchen.. 1 by mouth daily 4)  Zolpidem Tartrate 10 Mg Tabs (Zolpidem tartrate) .... 1/2 or 1 by mouth at hs prn 5)  Lotrisone 1-0.05 % Crea (Clotrimazole-betamethasone) .... Use two times a day x 3 wks 6)  Hydrocodone-acetaminophen 5-325 Mg Tabs (Hydrocodone-acetaminophen) .Marland Kitchen.. 1 by mouth up to 2 times per day as needed for pain 7)  Triamcinolone Acetonide 0.5 % Crea (Triamcinolone acetonide) .... Apply bid to affected area 8)  Cymbalta 30 Mg Cpep (Duloxetine hcl) .Marland Kitchen.. 1 by mouth qd  Patient Instructions: 1)  Please schedule a follow-up appointment in 3 months. 2)  Stretching exercises!!!! Prescriptions: MICROZIDE 12.5 MG CAPS (HYDROCHLOROTHIAZIDE) 1 by mouth qam  #30 x 12   Entered and Authorized by:   Tresa Garter MD   Signed by:   Tresa Garter MD on 11/30/2008   Method used:   Print then Give to Patient   RxID:   [id number] HYDROCODONE-ACETAMINOPHEN 5-325 MG TABS (HYDROCODONE-ACETAMINOPHEN) 1 by mouth up to 2 times per day as needed for pain  #60 x 3   Entered and Authorized by:   Tresa Garter MD   Signed by:   Tresa Garter MD on 11/30/2008   Method used:   Print then Give to Patient   RxID:   [id number] ZOLPIDEM TARTRATE 10 MG  TABS (ZOLPIDEM TARTRATE) 1/2 or 1 by mouth at hs prn  #30 x 3   Entered and Authorized by:   Tresa Garter MD   Signed by:   Georgina Quint Plotnikov  MD on 11/30/2008   Method used:   Print then Give to Patient   RxID:   [id number]

## 2010-08-15 NOTE — Letter (Signed)
Summary: CMN For CPAP Supplies / [facility]  CMN For CPAP Supplies / [employer]   Imported By: Lennie Odor 07/26/2010 11:43:21  _____________________________________________________________________  External Attachment:    Type:   Image     Comment:   External Document

## 2010-08-15 NOTE — Progress Notes (Signed)
Summary: Nuclear pre procedure  Phone Note Outgoing Call Call back at [facility] Phone [phone]   Call placed by: Rea College, CMA,  March 25, 2010 2:41 PM Call placed to: Patient Summary of Call: Reviewed information on Myoview Information Sheet (see scanned document for further details).  Spoke with patient.      Nuclear Med Background Indications for Stress Test: Evaluation for Ischemia   History: COPD, Heart Catheterization   Symptoms: Chest Pain, Fatigue, SOB    Nuclear Pre-Procedure Cardiac Risk Factors: Hypertension, Smoker Height (in): 62

## 2010-08-15 NOTE — Progress Notes (Signed)
Summary: zolpidem  Phone Note Other Incoming Call back at fax   Caller: kerr [phone] Summary of Call: refill request on zolpidem   ok x 5 refils will fax back to the pharmacy/vg Initial call taken by: Tora Perches,  August 02, 2009 1:17 PM    Prescriptions: ZOLPIDEM TARTRATE 10 MG  TABS (ZOLPIDEM TARTRATE) 1/2 or 1 by mouth at hs prn  #30 x 5   Entered by:   Tora Perches   Authorized by:   Tresa Garter MD   Signed by:   Tora Perches on 08/02/2009   Method used:   Telephoned to ...       [facility] [id number] (retail)       [address] [place], [place]  [address]       Ph: [phone]       Fax: [phone]   RxID:   [id number]

## 2010-08-15 NOTE — Assessment & Plan Note (Signed)
Summary: headache/leg pain/#/cd   Vital Signs:  Patient profile:   65 year old female Weight:      194 pounds Temp:     97.4 degrees F oral Pulse rate:   93 / minute BP sitting:   126 / 80  (left arm)  Vitals Entered By: Tora Perches (May 11, 2009 1:51 PM) CC: h/a and leg pain Is Patient Diabetic? No   Primary Care Provider:  Tresa Garter MD  CC:  h/a and leg pain.  History of Present Illness: C/o HAs, R earache x 2 wks C/o a sore on lower lip on R x 2 d C/o bruises  Preventive Screening-Counseling & Management  Alcohol-Tobacco     Smoking Status: never  Current Medications (verified): 1)  Aspirin 81 Mg  Tbec (Aspirin) .... One By Mouth Every Day 2)  Vitamin D3 1000 Unit  Tabs (Cholecalciferol) .Marland Kitchen.. 1 By Mouth Daily 3)  Zolpidem Tartrate 10 Mg  Tabs (Zolpidem Tartrate) .... 1/2 or 1 By Mouth At [facility] Prn 4)  Hydrocodone-Acetaminophen 5-325 Mg Tabs (Hydrocodone-Acetaminophen) .Marland Kitchen.. 1 By Mouth Up To 2 Times Per Day As Needed For Pain 5)  Cymbalta 30 Mg Cpep (Duloxetine Hcl) .Marland Kitchen.. 1 By Mouth Qd 6)  Furosemide 20 Mg Tabs (Furosemide) .... Take 1 Tab By Mouth Every Day As Needed Swelling in Hands 7)  Gabapentin 100 Mg Caps (Gabapentin) .Marland Kitchen.. 1 By Mouth Three Times A Day As Needed Feet Burning 8)  Prempro 0.625-2.5 Mg Tabs (Conj Estrog-Medroxyprogest Ace) .Marland Kitchen.. 1 By Mouth Qd 9)  Promethazine-Dm 6.25-15 Mg/19ml Syrp (Promethazine-Dm) .... 5-10 Ml By Mouth Qid As Needed For Cough  Allergies: 1)  Zetia (Ezetimibe) 2)  Tramadol Hcl 3)  Wellbutrin (Bupropion Hcl)  Past History:  Past Medical History: Last updated: 04/13/2009 Osteoarthritis Skin Tag - Left cheek on face , pt anxious about excision. 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 FLP 169/146/53/87  (10/06) Non Cardiac Chest Pain - normal Dobutamine and Myoview (last 4/06) Depression (01/02/2006) Low back pain Hypertension (07/15/2004) COPD Anxiety (07/15/2004) Current  Problems:  ESOTROPIA, LEFT EYE (ICD-378.01) Insomnia Menopause 2009 TOBACCO ABUSE (ICD-305.1) FIBROMYALGIA (ICD-729.1) Poss periph neuropathy LE of unknown etiol 2010 Migraines  Social History: Last updated: 01/18/2008 Retired Married Current Smoker 1 ppd Alcohol use-no  Social History: Smoking Status:  never  Physical Exam  General:  alert, well-hydrated, appropriate dress, good hygiene, and overweight-appearing.   Head:  R TMJ is tender Ears:  B wax Mouth:  R lower lip w/a blister Lungs:  Normal respiratory effort, chest expands symmetrically. Lungs are clear to auscultation, no crackles or wheezes. Heart:  Normal rate and regular rhythm. S1 and S2 normal without gallop, murmur, click, rub or other extra sounds. Abdomen:  soft, non-tender, normal bowel sounds, no distention, no masses, no guarding, and no rigidity.   Msk:    Lumbar-sacral spine is tender to palpation over paraspinal muscles and painfull with the ROM  Extremities:  trace left pedal edema and trace right pedal edema.   Neurologic:  No cranial nerve deficits noted. Station and gait are normal. Plantar reflexes are down-going bilaterally. DTRs are symmetrical throughout. Sensory, motor and coordinative functions appear intact. Skin:  turgor normal, color normal, no rashes, no suspicious lesions, no ecchymoses, and no petechiae.   Psych:  Cognition and judgment appear intact. Alert and cooperative with normal attention span and concentration. No apparent delusions, illusions, hallucinations. Less depressed   Impression & Recommendations:  Problem # 1:  T M J (ICD-524.9)R Assessment New Discussed  Problem # 2:  COLD SORE (ICD-054.9) R lower lip Assessment: New Zovirax  Problem # 3:  CERUMEN IMPACTION (ICD-380.4) B Assessment: Deteriorated  Orders: Cerumen Impaction Removal (14782) Procedure: ear irrigation Reason: wax impaction B Risks/benefis were discussed. Both ears were irrigated with warm water.  Large ammount of wax was recovered. Instrumentation with metal ear loop was performed to accomplish the removal. Tolerated well Complications: none    Problem # 4:  HEADACHE (ICD-784.0) due to #1-3 Assessment: New  Her updated medication list for this problem includes:    Aspirin 81 Mg Tbec (Aspirin) ..... One by mouth every day    Hydrocodone-acetaminophen 5-325 Mg Tabs (Hydrocodone-acetaminophen) .Marland Kitchen... 1 by mouth up to 2 times per day as needed for pain  Complete Medication List: 1)  Aspirin 81 Mg Tbec (Aspirin) .... One by mouth every day 2)  Vitamin D3 1000 Unit Tabs (Cholecalciferol) .Marland Kitchen.. 1 by mouth daily 3)  Zolpidem Tartrate 10 Mg Tabs (Zolpidem tartrate) .... 1/2 or 1 by mouth at hs prn 4)  Hydrocodone-acetaminophen 5-325 Mg Tabs (Hydrocodone-acetaminophen) .Marland Kitchen.. 1 by mouth up to 2 times per day as needed for pain 5)  Cymbalta 30 Mg Cpep (Duloxetine hcl) .Marland Kitchen.. 1 by mouth qd 6)  Furosemide 20 Mg Tabs (Furosemide) .... Take 1 tab by mouth every day as needed swelling in hands 7)  Gabapentin 100 Mg Caps (Gabapentin) .Marland Kitchen.. 1 by mouth three times a day as needed feet burning 8)  Prempro 0.625-2.5 Mg Tabs (Conj estrog-medroxyprogest ace) .Marland Kitchen.. 1 by mouth qd 9)  Promethazine-dm 6.25-15 Mg/43ml Syrp (Promethazine-dm) .... 5-10 ml by mouth qid as needed for cough 10)  Zovirax 400 Mg Tabs (Acyclovir) .Marland Kitchen.. 1 by mouth three times a day for a cold sore  Patient Instructions: 1)  Keep return office visit  Prescriptions: ZOVIRAX 400 MG TABS (ACYCLOVIR) 1 by mouth three times a day for a cold sore  #21 x 1   Entered and Authorized by:   Tresa Garter MD   Signed by:   Tresa Garter MD on 05/11/2009   Method used:   Electronically to        [facility] [id number] (retail)       [address] [place], [place]  [address]       Ph: [phone]       Fax: [phone]   RxID:   [id number]

## 2010-08-15 NOTE — Progress Notes (Signed)
Summary: Rf Hydroco/Acetamin  Phone Note Refill Request Message from:  Fax from Pharmacy  Refills Requested: Medication #1:  HYDROCODONE-ACETAMINOPHEN 10-325 MG TABS 1 by mouth two times a day by mouth two times a day as needed pain   Dosage confirmed as above?Dosage Confirmed   Supply Requested: 1 month   Last Refilled: 12/20/2009  Method Requested: Telephone to Pharmacy Next Appointment Scheduled: none Initial call taken by: Lanier Prude, [facility]),  January 28, 2010 4:49 PM  Follow-up for Phone Call        ok to ref Follow-up by: Tresa Garter MD,  January 28, 2010 6:06 PM  Additional Follow-up for Phone Call Additional follow up Details #1::        Rx called to pharmacy Additional Follow-up by: Lanier Prude, [facility]),  January 29, 2010 9:27 AM    Prescriptions: HYDROCODONE-ACETAMINOPHEN 10-325 MG TABS (HYDROCODONE-ACETAMINOPHEN) 1 by mouth two times a day by mouth two times a day as needed pain  #60 x 2   Entered by:   Lanier Prude, CMA(AAMA)   Authorized by:   Tresa Garter MD   Signed by:   Lanier Prude, CMA(AAMA) on 01/29/2010   Method used:   Telephoned to ...       [facility] [id number] (retail)       [address] [place], [place]  [address]       Ph: [phone]       Fax: [phone]   RxID:   [id number]

## 2010-08-15 NOTE — Letter (Signed)
Summary: *HSN Results Follow up  [facility]  [address] [place], [place] [address]   Phone: [phone]  Fax: [phone]      08/23/2007   MARITA BURNSED [place] [address] [place], [place]  [address]   Dear  Ms. Jennife Daher,                            ____S.Drinkard,FNP   ____D. Gore,FNP       ____B. McPherson,MD   ____V. Rankins,MD    ____E. Mulberry,MD    ____N. Daphine Deutscher, FNP  ____D. Reche Dixon, MD    ____K. Philipp Deputy, MD    ____Other     This letter is to inform you that your recent test(s):  _______Pap Smear    _______Lab Test     _______X-ray    _______ is within acceptable limits  _______ requires a medication change  _______ requires a follow-up lab visit  _______ requires a follow-up visit with your provider   Comments: We have tried to reach you by phone, but have been unable to reach you.                 Please call our office to schedule an appointment.      _________________________________________________________ If you have any questions, please contact our office                     Sincerely,  Tiffany McCoy CMA [facility]

## 2010-08-15 NOTE — Assessment & Plan Note (Signed)
Summary: body hurt/cough/$50/pn   Vital Signs:  Patient Profile:   65 Years Old Female Height:     62 inches Weight:      195 pounds Temp:     98.2 degrees F oral Pulse rate:   76 / minute BP sitting:   120 / 74  (left arm)  Vitals Entered By: Tora Perches (March 28, 2008 3:45 PM)                 PCP:  Tresa Garter MD  Chief Complaint:  Multiple medical problems or concerns.  History of Present Illness: The patient presents with complaints of sore throat, fever, cough, sinus congestion and drainge of 10-14 days duration. Not better with OTC meds. Chest hurts with coughing. Can't sleep due to cough. The mucus is not colored. C/o wt gain and depression w/insomnia    Current Allergies (reviewed today): ZETIA (EZETIMIBE)  Past Medical History:    Reviewed history from 02/22/2008 and no changes required:       Osteoarthritis       Skin Tag - Left cheek on face , pt anxious about excision.       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       FLP 169/146/53/87  (10/06)       Non Cardiac Chest Pain - normal Dobutamine and Myoview (last 4/06)       Depression (01/02/2006)       Low back pain       Hypertension (07/15/2004)       COPD       Anxiety (07/15/2004)       Current Problems:        ESOTROPIA, LEFT EYE (ICD-378.01)       Menopause 2009       TOBACCO ABUSE (ICD-305.1)       FIBROMYALGIA (ICD-729.1)                 Family History:    Reviewed history from 11/19/2007 and no changes required:       Family History Hypertension       No DM  Social History:    Reviewed history from 01/18/2008 and no changes required:       Retired       Married       Current Smoker 1 ppd       Alcohol use-no     Physical Exam  General:     overweight-appearing.   Head:     Normocephalic and atraumatic without obvious abnormalities. No apparent alopecia or balding. Ears:     External ear exam shows no significant  lesions or deformities.  Otoscopic examination reveals clear canals, tympanic membranes are intact bilaterally without bulging, retraction, inflammation or discharge. Hearing is grossly normal bilaterally. Nose:     External nasal examination shows no deformity or inflammation. Nasal mucosa are pink and moist without lesions or exudates. Mouth:     Oral mucosa and oropharynx without lesions or exudates.  Teeth in good repair. Neck:     No deformities, masses, or tenderness noted. Lungs:     CTA Hard cough Heart:     Normal rate and regular rhythm. S1 and S2 normal without gallop, murmur, click, rub or other extra sounds. Abdomen:     Bowel sounds positive,abdomen soft and non-tender without masses, organomegaly or hernias noted. Msk:     Lumbar-sacral spine is tender to  palpation over paraspinal muscles and painfull with the ROM  Neurologic:     No cranial nerve deficits noted. Station and gait are normal. Plantar reflexes are down-going bilaterally. DTRs are symmetrical throughout. Sensory, motor and coordinative functions appear intact. Skin:     Intact without suspicious lesions or rashes Psych:     SadOriented X3.      Impression & Recommendations:  Problem # 1:  BRONCHITIS, ACUTE (ICD-466.0) Assessment: New  Her updated medication list for this problem includes:    Zithromax Z-pak 250 Mg Tabs (Azithromycin) ..... Use as directed    Promethazine-codeine 6.25-10 Mg/69ml Syrp (Promethazine-codeine) .Marland Kitchen... Take 5-47mls by mouth every 4-6 hours as needed for cough   Problem # 2:  INSOMNIA, PERSISTENT (ICD-307.42) Assessment: Unchanged  Problem # 3:  FIBROMYALGIA (ICD-729.1) Assessment: Deteriorated  Her updated medication list for this problem includes:    Aspirin 81 Mg Tbec (Aspirin) ..... One by mouth every day    Meloxicam 15 Mg Tabs (Meloxicam) ..... One by mouth daily for arthritis prn    Tramadol Hcl 50 Mg Tabs (Tramadol hcl) .Marland Kitchen... 1 by mouth two times a day as needed  pain   Problem # 4:  DEPRESSION (ICD-311) Assessment: Deteriorated  The following medications were removed from the medication list:    Trazodone Hcl 50 Mg Tabs (Trazodone hcl) .Marland Kitchen... 1.5 or 2 tabs by mouth at bedtime as needed insomnia  Her updated medication list for this problem includes:    Citalopram Hydrobromide 40 Mg Tabs (Citalopram hydrobromide) .Marland Kitchen... 1/2 by mouth qhs    Wellbutrin Sr 150 Mg Xr12h-tab (Bupropion hcl) .Marland Kitchen... 1 by mouth qam x 1 wk then 1 by mouth bid   Complete Medication List: 1)  Citalopram Hydrobromide 40 Mg Tabs (Citalopram hydrobromide) .... 1/2 by mouth qhs 2)  Aspirin 81 Mg Tbec (Aspirin) .... One by mouth every day 3)  Vitamin D3 1000 Unit Tabs (Cholecalciferol) .Marland Kitchen.. 1 by mouth daily 4)  Microzide 12.5 Mg Caps (Hydrochlorothiazide) .Marland Kitchen.. 1 by mouth qam 5)  Meloxicam 15 Mg Tabs (Meloxicam) .... One by mouth daily for arthritis prn 6)  Tramadol Hcl 50 Mg Tabs (Tramadol hcl) .Marland Kitchen.. 1 by mouth two times a day as needed pain 7)  Wellbutrin Sr 150 Mg Xr12h-tab (Bupropion hcl) .Marland Kitchen.. 1 by mouth qam x 1 wk then 1 by mouth bid 8)  Zithromax Z-pak 250 Mg Tabs (Azithromycin) .... Use as directed 9)  Promethazine-codeine 6.25-10 Mg/40ml Syrp (Promethazine-codeine) .... Take 5-17mls by mouth every 4-6 hours as needed for cough   Patient Instructions: 1)  Please schedule a follow-up appointment in 2 months.   Prescriptions: PROMETHAZINE-CODEINE 6.25-10 MG/5ML  SYRP (PROMETHAZINE-CODEINE) Take 5-61mls by mouth every 4-6 hours as needed for cough  #370mls x 0   Entered and Authorized by:   Tresa Garter MD   Signed by:   Tresa Garter MD on 03/28/2008   Method used:   Print then Give to Patient   RxID:   [id number] ZITHROMAX Z-PAK 250 MG  TABS (AZITHROMYCIN) Use as directed  #1 x 0   Entered and Authorized by:   Tresa Garter MD   Signed by:   Tresa Garter MD on 03/28/2008   Method used:   Print then Give to Patient   RxID:    [id number] WELLBUTRIN SR 150 MG XR12H-TAB (BUPROPION HCL) 1 by mouth qam x 1 wk then 1 by mouth bid  #60 x 6   Entered and Authorized by:  Tresa Garter MD   Signed by:   Tresa Garter MD on 03/28/2008   Method used:   Print then Give to Patient   RxID:   [id number]  ]

## 2010-08-15 NOTE — Assessment & Plan Note (Signed)
Summary: DR AVP PT-COUGH -HEAD ACHE-BURNING THROAT-RUNNY NOSE-#-STC   Vital Signs:  Patient profile:   65 year old female Height:      62 inches Weight:      196 pounds BMI:     35.98 O2 Sat:      97 % on Room air Temp:     98.4 degrees F oral Pulse rate:   110 / minute Resp:     20 per minute BP sitting:   106 / 64  (left arm) Cuff size:   large  Vitals Entered By: Rock Nephew CMA (March 29, 2009 10:22 AM)  Nutrition Counseling: Patient's BMI is greater than 25 and therefore counseled on weight management options.  O2 Flow:  Room air CC: cough, headache, congestion, URI symptoms Is Patient Diabetic? No   Primary Care [place]:  Georgina Quint Plotnikov MD  CC:  cough, headache, congestion, and URI symptoms.  History of Present Illness:  URI Symptoms      This is a 65 year old woman who presents with URI symptoms.  The symptoms began 3 days ago.  The severity is described as moderate.  The patient reports nasal congestion, purulent nasal discharge, sore throat, and dry cough, but denies earache and sick contacts.  The patient denies fever, stiff neck, dyspnea, wheezing, rash, vomiting, diarrhea, and use of an antipyretic.  Risk factors for Strep sinusitis include unilateral facial pain, unilateral nasal discharge, and double sickening.    Preventive Screening-Counseling & Management  Alcohol-Tobacco     Smoking Cessation Counseling: yes  Current Medications (verified): 1)  Aspirin 81 Mg  Tbec (Aspirin) .... One By Mouth Every Day 2)  Vitamin D3 1000 Unit  Tabs (Cholecalciferol) .Marland Kitchen.. 1 By Mouth Daily 3)  Zolpidem Tartrate 10 Mg  Tabs (Zolpidem Tartrate) .... 1/2 or 1 By Mouth At [facility] Prn 4)  Hydrocodone-Acetaminophen 5-325 Mg Tabs (Hydrocodone-Acetaminophen) .Marland Kitchen.. 1 By Mouth Up To 2 Times Per Day As Needed For Pain 5)  Cymbalta 30 Mg Cpep (Duloxetine Hcl) .Marland Kitchen.. 1 By Mouth Qd 6)  Furosemide 20 Mg Tabs (Furosemide) .... Take 1 Tab By Mouth Every Day As Needed Swelling in  Hands 7)  Gabapentin 100 Mg Caps (Gabapentin) .Marland Kitchen.. 1 By Mouth Three Times A Day As Needed Feet Burning 8)  Prempro 0.625-2.5 Mg Tabs (Conj Estrog-Medroxyprogest Ace) .Marland Kitchen.. 1 By Mouth Qd  Allergies (verified): 1)  Zetia (Ezetimibe) 2)  Tramadol Hcl 3)  Wellbutrin (Bupropion Hcl)  Past History:  Past Medical History: Reviewed history from 03/01/2009 and no changes required. Osteoarthritis Skin Tag - Left cheek on face , pt anxious about excision. 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 Cholecystectomy  Family History: Reviewed history from 11/19/2007 and no changes required. Family History Hypertension No DM  Social History: Reviewed history from 01/18/2008 and no changes required. Retired Married Current Smoker 1 ppd Alcohol use-no  Review of Systems  The patient denies anorexia, fever, hemoptysis, abdominal pain, difficulty walking, enlarged lymph nodes, and angioedema.    Physical Exam  General:  alert, well-developed, well-nourished, well-hydrated, appropriate dress, good hygiene, and overweight-appearing.   Head:  normocephalic and  atraumatic.   Ears:  R ear normal and L ear normal.   Nose:  no airflow obstruction, no intranasal foreign body, no nasal polyps, no nasal mucosal lesions, no mucosal friability, no active bleeding or clots, no sinus percussion tenderness, nasal dischargemucosal pallor, and mucosal edema.   Mouth:  good dentition and no gingival abnormalities.   Neck:  supple, full  ROM, no masses, no carotid bruits, no cervical lymphadenopathy, and no neck tenderness.   Lungs:  Normal respiratory effort, chest expands symmetrically. Lungs are clear to auscultation, no crackles or wheezes. Heart:  Normal rate and regular rhythm. S1 and S2 normal without gallop, murmur, click, rub or other extra sounds. Abdomen:  soft, non-tender, normal bowel sounds, no distention, no masses, no guarding, and no rigidity.   Msk:  normal ROM, no joint tenderness, no joint swelling, no joint warmth, and no redness over joints.   Skin:  turgor normal, color normal, no rashes, no suspicious lesions, no ecchymoses, and no petechiae.   Cervical Nodes:  No lymphadenopathy noted Axillary Nodes:  No palpable lymphadenopathy Psych:  Cognition and judgment appear intact. Alert and cooperative with normal attention span and concentration. No apparent delusions, illusions, hallucinations   Impression & Recommendations:  Problem # 1:  BRONCHITIS, ACUTE (ICD-466.0) Assessment Deteriorated  Her updated medication list for this problem includes:    Ceftin 500 Mg Tab (Cefuroxime axetil) .Marland Kitchen... Take one (1) tablet by mouth two (2) times a day x 10 days    Promethazine-dm 6.25-15 Mg/38ml Syrp (Promethazine-dm) .Marland Kitchen... 5-10 ml by mouth qid as needed for cough  Problem # 2:  COUGH (ICD-786.2) Assessment: Deteriorated  Orders: T-2 View CXR (71020TC)  Problem # 3:  HYPERTENSION (ICD-401.9) Assessment: Improved  Her updated medication list for this problem includes:    Furosemide 20 Mg Tabs (Furosemide) .Marland Kitchen... Take 1 tab by mouth every day as needed swelling in hands  Complete Medication List: 1)  Aspirin 81 Mg Tbec (Aspirin) .... One by mouth every day 2)  Vitamin D3 1000 Unit Tabs (Cholecalciferol) .Marland Kitchen.. 1 by mouth daily 3)  Zolpidem Tartrate 10 Mg Tabs (Zolpidem tartrate) .... 1/2 or 1 by mouth at hs prn 4)  Hydrocodone-acetaminophen 5-325 Mg Tabs (Hydrocodone-acetaminophen) .Marland Kitchen.. 1 by mouth up to 2  times per day as needed for pain 5)  Cymbalta 30 Mg Cpep (Duloxetine hcl) .Marland Kitchen.. 1 by mouth qd 6)  Furosemide 20 Mg Tabs (Furosemide) .... Take 1 tab by mouth every day as needed swelling in hands 7)  Gabapentin 100 Mg Caps (Gabapentin) .Marland Kitchen.. 1 by mouth three times a day as needed feet burning 8)  Prempro 0.625-2.5 Mg Tabs (Conj estrog-medroxyprogest ace) .Marland Kitchen.. 1 by mouth qd 9)  Ceftin 500 Mg Tab (Cefuroxime axetil) .... Take one (1) tablet by mouth two (2) times a day x 10 days 10)  Promethazine-dm 6.25-15 Mg/40ml Syrp (Promethazine-dm) .... 5-10 ml by mouth qid as needed for cough  Patient Instructions: 1)  Please schedule a follow-up appointment in 2 weeks. 2)  Tobacco is very bad for your health and your loved ones! You Should stop smoking!. 3)  Stop Smoking Tips: Choose a Quit date. Cut down before the Quit date. decide what you will do as a substitute when you feel the urge to smoke(gum,toothpick,exercise). 4)  Take your antibiotic as prescribed until ALL of it is gone, but stop if you develop a rash or swelling and contact our office as soon as possible. 5)  Acute bronchitis symptoms for less than  10 days are not helped by antibiotics. take over the counter cough medications. call if no improvment in  5-7 days, sooner if increasing cough, fever, or new symptoms( shortness of breath, chest pain). Prescriptions: PROMETHAZINE-DM 6.25-15 MG/5ML SYRP (PROMETHAZINE-DM) 5-10 ml by mouth QID as needed for cough  #6 ounces x 1   Entered and Authorized by:   Etta Grandchild MD   Signed by:   Etta Grandchild MD on 03/29/2009   Method used:   Electronically to        [facility] [id number] (retail)       [address] [place], [place]  [address]       Ph: [phone]       Fax: [phone]   RxID:   [id number] CEFTIN 500 MG TAB (CEFUROXIME AXETIL) Take one (1) tablet by mouth two (2) times a day X 10 days  #20 x 1   Entered and Authorized by:   Etta Grandchild  MD   Signed by:   Etta Grandchild MD on 03/29/2009   Method used:   Electronically to        [facility] [id number] (retail)       [address] [place], [place]  [address]       Ph: [phone]       Fax: [phone]   RxID:   [id number]

## 2010-08-15 NOTE — Assessment & Plan Note (Signed)
Summary: 2 MO ROFV /NWS $50   Vital Signs:  Patient Profile:   65 Years Old Female Weight:      193 pounds Temp:     99.1 degrees F oral Pulse rate:   93 / minute BP sitting:   114 / 72  (left arm)  Vitals Entered By: Tora Perches (February 22, 2008 11:05 AM)                 Chief Complaint:  Multiple medical problems or concerns.  History of Present Illness: The patient presents for a follow up of hypertension, sweats, hyperlipidemia. C/o urinary sx's. Prempro worked great! Asking for a jury duty excuse letter. Insomnia is not better on trazodone 50.    Current Allergies (reviewed today): ZETIA (EZETIMIBE)  Past Medical History:    Reviewed history from 11/19/2007 and no changes required:       Osteoarthritis       Skin Tag - Left cheek on face , pt anxious about excision.       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       FLP 169/146/53/87  (10/06)       Non Cardiac Chest Pain - normal Dobutamine and Myoview (last 4/06)       Depression (01/02/2006)       Low back pain       Hypertension (07/15/2004)       COPD       Anxiety (07/15/2004)       Current Problems:        ESOTROPIA, LEFT EYE (ICD-378.01)       Menopause 2009       TOBACCO ABUSE (ICD-305.1)       FIBROMYALGIA (ICD-729.1)                 Family History:    Reviewed history from 11/19/2007 and no changes required:       Family History Hypertension       No DM  Social History:    Reviewed history from 01/18/2008 and no changes required:       Retired       Married       Current Smoker 1 ppd       Alcohol use-no    Review of Systems       The patient complains of difficulty walking and depression.  The patient denies fever, weight gain, chest pain, dyspnea on exertion, prolonged cough, hemoptysis, hematochezia, hematuria, abnormal bleeding, and enlarged lymph nodes.         LBP   Physical Exam  General:     overweight-appearing.   Head:  Normocephalic and atraumatic without obvious abnormalities. No apparent alopecia or balding. Eyes:     No corneal or conjunctival inflammation noted. EOMI. Perrla. Funduscopic exam benign, without hemorrhages, exudates or papilledema. Vision grossly normal. Ears:     External ear exam shows no significant lesions or deformities.  Otoscopic examination reveals clear canals, tympanic membranes are intact bilaterally without bulging, retraction, inflammation or discharge. Hearing is grossly normal bilaterally. Nose:     External nasal examination shows no deformity or inflammation. Nasal mucosa are pink and moist without lesions or exudates. Mouth:     Oral mucosa and oropharynx without lesions or exudates.  Teeth in good repair. Neck:     No deformities, masses, or tenderness noted. Lungs:     Normal respiratory effort, chest expands symmetrically.  Lungs are clear to auscultation, no crackles or wheezes. Heart:     Normal rate and regular rhythm. S1 and S2 normal without gallop, murmur, click, rub or other extra sounds. Abdomen:     Bowel sounds positive,abdomen soft and non-tender without masses, organomegaly or hernias noted. Genitalia:     Normal introitus for age, no external lesions, no vaginal discharge, mucosa pink and moist, no vaginal or cervical lesions, no vaginal atrophy, no friaility or hemorrhage, normal uterus size and position, no adnexal masses or tenderness Msk:     Lumbar-sacral spine is tender to palpation over paraspinal muscles and painfull with the ROM  Pulses:     R and L carotid,radial,femoral,dorsalis pedis and posterior tibial pulses are full and equal bilaterally Extremities:     No clubbing, cyanosis, edema, or deformity noted with normal full range of motion of all joints.   Neurologic:     No cranial nerve deficits noted. Station and gait are normal. Plantar reflexes are down-going bilaterally. DTRs are symmetrical throughout. Sensory, motor and coordinative  functions appear intact. Skin:     Intact without suspicious lesions or rashes Psych:     Cognition and judgment appear intact. Alert and cooperative with normal attention span and concentration. No apparent delusions, illusions, hallucinations    Impression & Recommendations:  Problem # 1:  SWEATING (ICD-780.8) Assessment: Improved Cont HRT  Problem # 2:  FATIGUE (ICD-780.79) Assessment: Improved  Problem # 3:  LOW BACK PAIN (ICD-724.2) Assessment: Improved  Her updated medication list for this problem includes:    Aspirin 81 Mg Tbec (Aspirin) ..... One by mouth every day    Meloxicam 15 Mg Tabs (Meloxicam) ..... One by mouth daily for arthritis prn    Tramadol Hcl 50 Mg Tabs (Tramadol hcl) .Marland Kitchen... 1 by mouth two times a day as needed pain Letter to Court written >45 min   Problem # 4:  DEPRESSION (ICD-311) Assessment: Improved  Her updated medication list for this problem includes:    Citalopram Hydrobromide 40 Mg Tabs (Citalopram hydrobromide) .Marland Kitchen... 1 by mouth qday    Trazodone Hcl 50 Mg Tabs (Trazodone hcl) .Marland Kitchen... 1.5 or 2 tabs by mouth at bedtime as needed insomnia   Problem # 5:  INSOMNIA, PERSISTENT (ICD-307.42) Assessment: Unchanged Trazodone 11/2 or 2 at hs  Problem # 6:  CYSTITIS (ICD-595.9) Assessment: New UA Orders: TLB-Udip ONLY (81003-UDIP)  Her updated medication list for this problem includes:    Cipro 250 Mg Tabs (Ciprofloxacin hcl) .Marland Kitchen... 1 by mouth 2 times daily   Complete Medication List: 1)  Citalopram Hydrobromide 40 Mg Tabs (Citalopram hydrobromide) .Marland Kitchen.. 1 by mouth qday 2)  Aspirin 81 Mg Tbec (Aspirin) .... One by mouth every day 3)  Vitamin D3 1000 Unit Tabs (Cholecalciferol) .Marland Kitchen.. 1 by mouth daily 4)  Microzide 12.5 Mg Caps (Hydrochlorothiazide) .Marland Kitchen.. 1 by mouth qam 5)  Meloxicam 15 Mg Tabs (Meloxicam) .... One by mouth daily for arthritis prn 6)  Tramadol Hcl 50 Mg Tabs (Tramadol hcl) .Marland Kitchen.. 1 by mouth two times a day as needed pain 7)   Prempro 0.625-2.5 Mg Tabs (Conj estrog-medroxyprogest ace) .Marland Kitchen.. 1 by mouth qd 8)  Trazodone Hcl 50 Mg Tabs (Trazodone hcl) .... 1.5 or 2 tabs by mouth at bedtime as needed insomnia 9)  Cipro 250 Mg Tabs (Ciprofloxacin hcl) .Marland Kitchen.. 1 by mouth 2 times daily   Patient Instructions: 1)  Please schedule a follow-up appointment in 2 months.   Prescriptions: CIPRO 250 MG TABS (CIPROFLOXACIN HCL)  1 by mouth 2 times daily  #10 x 1   Entered and Authorized by:   Tresa Garter MD   Signed by:   Tresa Garter MD on 02/22/2008   Method used:   Print then Give to Patient   RxID:   [id number] TRAZODONE HCL 50 MG TABS (TRAZODONE HCL) 1.5 or 2 tabs by mouth at bedtime as needed insomnia  #60 x 6   Entered and Authorized by:   Tresa Garter MD   Signed by:   Tresa Garter MD on 02/22/2008   Method used:   Print then Give to Patient   RxID:   [id number] PREMPRO 0.625-2.5 MG  TABS (CONJ ESTROG-MEDROXYPROGEST ACE) 1 by mouth qd  #30 x 12   Entered and Authorized by:   Tresa Garter MD   Signed by:   Tresa Garter MD on 02/22/2008   Method used:   Print then Give to Patient   RxID:   [id number]  ]  Appended Document: Orders Update    Clinical Lists Changes  Orders: Added new Test order of TLB-Udip w/ Micro (81001-URINE) - Signed

## 2010-08-15 NOTE — Progress Notes (Signed)
Summary: Different RX  Phone Note Call from Patient Call back at [facility] Phone [phone]   Summary of Call: Rx given for arthritis is working well but insurance will not cover med. Patient is requesting alternative rx.  Initial call taken by: Lamar Sprinkles,  September 27, 2008 3:02 PM  Follow-up for Phone Call        Is Cymbalta covered? Follow-up by: Tresa Garter MD,  September 27, 2008 5:52 PM  Additional Follow-up for Phone Call Additional follow up Details #1::        Per emr, seems that it is but says not preferred, provide rx details and we will send thru to pharm for confirmation Additional Follow-up by: Lamar Sprinkles,  September 27, 2008 6:19 PM    Additional Follow-up for Phone Call Additional follow up Details #2::    OK Follow-up by: Tresa Garter MD,  September 28, 2008 1:28 PM  Additional Follow-up for Phone Call Additional follow up Details #3:: Details for Additional Follow-up Action Taken: Pt informed  Additional Follow-up by: Lamar Sprinkles,  September 28, 2008 3:38 PM  New/Updated Medications: CYMBALTA 30 MG CPEP (DULOXETINE HCL) 1 by mouth qd   Prescriptions: CYMBALTA 30 MG CPEP (DULOXETINE HCL) 1 by mouth qd  #30 x 6   Entered by:   Lamar Sprinkles   Authorized by:   Tresa Garter MD   Signed by:   Lamar Sprinkles on 09/28/2008   Method used:   Electronically to        [facility] [id number] (retail)       [address] [place], [place]  [address]       Ph: [phone]       Fax: [phone]   RxID:   [id number]

## 2010-08-15 NOTE — Letter (Signed)
Summary: Knees,legs,feet,wrists,shoulders & joint pain/Dr.William W.Trusl  Knees,legs,feet,wrists,shoulders & joint pain/Dr.William W.Truslow M.D.   Imported By: Sherian Rein 10/31/2008 13:13:21  _____________________________________________________________________  External Attachment:    Type:   Image     Comment:   External Document

## 2010-08-15 NOTE — Assessment & Plan Note (Signed)
Summary: HIP PAIN-$50-STC   Vital Signs:  Patient Profile:   65 Years Old Female Weight:      202 pounds Temp:     98.9 degrees F oral Pulse rate:   84 / minute BP sitting:   134 / 80  (left arm)  Vitals Entered By: Tora Perches (Dec 08, 2007 4:24 PM)                 Chief Complaint:  Multiple medical problems or concerns.  History of Present Illness: The patient presents for a follow up of back pain, HTN and headaches. Now is c/o pain in L hip and down L leg     Current Allergies: ZETIA (EZETIMIBE)  Past Medical History:    Reviewed history from 11/19/2007 and no changes required:       Osteoarthritis       Skin Tag - Left cheek on face , pt anxious about excision.       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       FLP 169/146/53/87  (10/06)       Non Cardiac Chest Pain - normal Dobutamine and Myoview (last 4/06)       Depression (01/02/2006)       Low back pain       Hypertension (07/15/2004)       COPD       Anxiety (07/15/2004)       Current Problems:        ESOTROPIA, LEFT EYE (ICD-378.01)       TOBACCO ABUSE (ICD-305.1)       FIBROMYALGIA (ICD-729.1)                 Family History:    Reviewed history from 11/19/2007 and no changes required:       Family History Hypertension       No DM  Social History:    Reviewed history from 11/19/2007 and no changes required:       Retired       Married       Current Smoker   Risk Factors:     Counseled to quit/cut down tobacco use:  yes   Review of Systems       The patient complains of weight gain and prolonged cough.  The patient denies fever and abdominal pain.     Physical Exam  General:     overweight-appearing.   Head:     Normocephalic and atraumatic without obvious abnormalities. No apparent alopecia or balding. Nose:     swollen mucosa Mouth:     Eryth throat Lungs:     Normal respiratory effort, chest expands symmetrically. Lungs are clear to  auscultation, no crackles or wheezes. Heart:     Normal rate and regular rhythm. S1 and S2 normal without gallop, murmur, click, rub or other extra sounds. Abdomen:     Bowel sounds positive,abdomen soft and non-tender without masses, organomegaly or hernias noted. Msk:     Lumbar-sacral spine is tender to palpation over paraspinal muscles and painfull with the ROM Str. leg elev is nl B Pulses:     R and L carotid,radial,femoral,dorsalis pedis and posterior tibial pulses are full and equal bilaterally Extremities:     No edema B Neurologic:     No cranial nerve deficits noted. Station and gait are normal. Plantar reflexes are down-going bilaterally. DTRs are symmetrical throughout. Sensory, motor and coordinative  functions appear intact. Skin:     Intact without suspicious lesions or rashes Psych:     Oriented X3 and normally interactive.      Impression & Recommendations:  Problem # 1:  LOW BACK PAIN (ICD-724.2) L schiatica Assessment: New  Her updated medication list for this problem includes:    Darvocet-n 100 100-650 Mg Tabs (Propoxyphene n-apap) .Marland Kitchen... 1po bid as needed by mouth    Aspirin 81 Mg Tbec (Aspirin) ..... One by mouth every day    Meloxicam 15 Mg Tabs (Meloxicam) ..... One by mouth daily for arthritis prn   Problem # 2:  COUGH (ICD-786.2) Assessment: New Due to #3  Problem # 3:  BRONCHITIS, ACUTE (ICD-466.0) Assessment: New  Her updated medication list for this problem includes:    Doxycycline Hyclate 100 Mg Caps (Doxycycline hyclate) .Marland Kitchen... Take 1 tab twice a day   Problem # 4:  TOBACCO ABUSE (ICD-305.1) Assessment: Unchanged Encouraged smoking cessation and discussed different methods for smoking cessation.  Orders: Tobacco use cessation intermediate 3-10 minutes (99406)   Problem # 5:  HEADACHE (ICD-784.0) Assessment: New Poss sinusitis The following medications were removed from the medication list:    Atenolol 50 Mg Tabs (Atenolol) .Marland Kitchen... Take  one tablet by mouth daily.  Her updated medication list for this problem includes:    Darvocet-n 100 100-650 Mg Tabs (Propoxyphene n-apap) .Marland Kitchen... 1po bid as needed by mouth    Aspirin 81 Mg Tbec (Aspirin) ..... One by mouth every day    Meloxicam 15 Mg Tabs (Meloxicam) ..... One by mouth daily for arthritis prn   Problem # 6:  HYPERTENSION (ICD-401.9) Assessment: Unchanged  The following medications were removed from the medication list:    Atenolol 50 Mg Tabs (Atenolol) .Marland Kitchen... Take one tablet by mouth daily - poss wt gain  Her updated medication list for this problem includes:    Microzide 12.5 Mg Caps (Hydrochlorothiazide) .Marland Kitchen... 1 by mouth qam  The following medications were removed from the medication list:    Atenolol 50 Mg Tabs (Atenolol) .Marland Kitchen... Take one tablet by mouth daily.  Her updated medication list for this problem includes:    Microzide 12.5 Mg Caps (Hydrochlorothiazide) .Marland Kitchen... 1 by mouth qam   Complete Medication List: 1)  Citalopram Hydrobromide 40 Mg Tabs (Citalopram hydrobromide) .Marland Kitchen.. 1 by mouth qday 2)  Darvocet-n 100 100-650 Mg Tabs (Propoxyphene n-apap) .Marland Kitchen.. 1po bid as needed by mouth 3)  Aspirin 81 Mg Tbec (Aspirin) .... One by mouth every day 4)  Vitamin D3 1000 Unit Tabs (Cholecalciferol) .Marland Kitchen.. 1 by mouth daily 5)  Microzide 12.5 Mg Caps (Hydrochlorothiazide) .Marland Kitchen.. 1 by mouth qam 6)  Meloxicam 15 Mg Tabs (Meloxicam) .... One by mouth daily for arthritis prn 7)  Doxycycline Hyclate 100 Mg Caps (Doxycycline hyclate) .... Take 1 tab twice a day   Patient Instructions: 1)  Please schedule a follow-up appointment in 2 months. 2)  Tobacco is very bad for your health and your loved ones! You Should stop smoking!.   Prescriptions: DOXYCYCLINE HYCLATE 100 MG CAPS (DOXYCYCLINE HYCLATE) Take 1 tab twice a day  #20 x 0   Entered and Authorized by:   Tresa Garter MD   Signed by:   Tresa Garter MD on 12/08/2007   Method used:   Electronically sent to ...        [facility] [phone]*       [address]       [place]  [place], [place]  [address]       Ph: [phone]       Fax: [phone]   RxID:   [id number] MELOXICAM 15 MG TABS (MELOXICAM) one by mouth daily for arthritis prn  #30 x 6   Entered and Authorized by:   Tresa Garter MD   Signed by:   Tresa Garter MD on 12/08/2007   Method used:   Electronically sent to ...       [facility] [id number]       [address] [place], [place]  [address]       Ph: [phone]       Fax: [phone]   RxID:   [id number] MICROZIDE 12.5 MG CAPS (HYDROCHLOROTHIAZIDE) 1 by mouth qam  #30 x 12   Entered and Authorized by:   Tresa Garter MD   Signed by:   Tresa Garter MD on 12/08/2007   Method used:   Electronically sent to ...       [facility] [address]       [address]       [place], [place]  [address]       Ph: [phone]       Fax: [phone]   RxID:   [id number]  ]

## 2010-08-15 NOTE — Miscellaneous (Signed)
Summary: abn sleep test  Clinical Lists Changes  Problems: Added new problem of OBSTRUCTIVE SLEEP APNEA (ICD-327.23) Orders: Added new Referral order of Pulmonary Referral (Pulmonary) - Signed

## 2010-08-15 NOTE — Assessment & Plan Note (Signed)
Summary: 3 MTH FU $50 STC   Vital Signs:  Patient profile:   65 year old female Weight:      196 pounds Temp:     98.4 degrees F oral Pulse rate:   97 / minute BP sitting:   116 / 74  (left arm)  Vitals Entered By: Tora Perches (March 01, 2009 9:50 AM) CC: f/u Is Patient Diabetic? No   Primary Care Ayomikun Starling:  Tresa Garter MD  CC:  f/u.  History of Present Illness: The patient presents for a follow up of back pain, anxiety, depression and headaches. C/o worsening of aches and pains, swelling and pain in hands, feet burning, sweats  Current Medications (verified): 1)  Microzide 12.5 Mg Caps (Hydrochlorothiazide) .Marland Kitchen.. 1 By Mouth Qam 2)  Aspirin 81 Mg  Tbec (Aspirin) .... One By Mouth Every Day 3)  Vitamin D3 1000 Unit  Tabs (Cholecalciferol) .Marland Kitchen.. 1 By Mouth Daily 4)  Zolpidem Tartrate 10 Mg  Tabs (Zolpidem Tartrate) .... 1/2 or 1 By Mouth At [facility] Prn 5)  Hydrocodone-Acetaminophen 5-325 Mg Tabs (Hydrocodone-Acetaminophen) .Marland Kitchen.. 1 By Mouth Up To 2 Times Per Day As Needed For Pain 6)  Cymbalta 30 Mg Cpep (Duloxetine Hcl) .Marland Kitchen.. 1 By Mouth Qd  Allergies: 1)  Zetia (Ezetimibe) 2)  Tramadol Hcl 3)  Wellbutrin (Bupropion Hcl)  Past History:  Past Surgical History: Last updated: 11/28/2006 Cholecystectomy  Family History: Last updated: 11/19/2007 Family History Hypertension No DM  Social History: Last updated: 01/18/2008 Retired Married Current Smoker 1 ppd Alcohol use-no  Past Medical History: Osteoarthritis Skin Tag - Left cheek on face , pt anxious about excision. 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   Nose:  WNL Mouth:  WNL Neck:  L submand LN tender Lungs:  CTA Heart:  RRR Abdomen:  S/NT Msk:  Lumbar-sacral spine is tender to palpation over paraspinal muscles and painfull with the ROM  Extremities:  trace left pedal edema and trace right pedal edema.   Neurologic:  No cranial nerve deficits noted. Station and gait are normal. Plantar reflexes are down-going bilaterally. DTRs are symmetrical throughout. Sensory, motor and coordinative functions appear intact. Skin:  Aging changes Psych:  Oriented X3 and subdued.     Impression & Recommendations:  Problem # 1:  ARTHRALGIA (ICD-719.40) Assessment Deteriorated On prescription therapy   Problem # 2:  PARESTHESIA (ICD-782.0) Assessment: Deteriorated Neurontin to try  Problem # 3:  EDEMA (ICD-782.3) Assessment: Deteriorated  The following medications were removed from the medication list:    Microzide 12.5 Mg Caps (Hydrochlorothiazide) .Marland Kitchen... 1 by mouth qam Her updated medication list for this problem includes:    Furosemide 20 Mg Tabs (Furosemide) .Marland Kitchen... Take 1 tab by mouth every day as needed swelling in hands  Problem # 4:  SWEATING (ICD-780.8) Assessment: Deteriorated Try HRT Risks vs benefits and controversies of a long term HRT use were discussed.  Problem # 5:  CERVICAL LYMPHADENOPATHY (ICD-785.6) L Assessment: New  Her updated medication list for this problem includes:    Cefuroxime Axetil 250 Mg Tabs (Cefuroxime axetil) .Marland Kitchen... 1 by mouth 2 times daily  Complete Medication List: 1)  Aspirin 81 Mg Tbec (Aspirin) .... One by mouth every day 2)  Vitamin D3 1000 Unit Tabs (Cholecalciferol) .Marland Kitchen.. 1 by mouth daily 3)  Zolpidem Tartrate 10 Mg Tabs (Zolpidem tartrate) .... 1/2 or 1 by mouth at hs prn 4)  Hydrocodone-acetaminophen 5-325 Mg Tabs (Hydrocodone-acetaminophen) .Marland Kitchen.. 1 by mouth up to 2 times per day as needed for  pain 5)  Cymbalta 30 Mg Cpep (Duloxetine hcl) .Marland Kitchen.. 1 by mouth qd 6)  Furosemide 20 Mg Tabs (Furosemide) .... Take 1 tab by mouth every day as needed swelling in hands 7)  Gabapentin 100 Mg Caps (Gabapentin) .Marland Kitchen.. 1 by mouth three times a day as needed feet burning 8)  Prempro 0.625-2.5 Mg Tabs (Conj estrog-medroxyprogest ace) .Marland Kitchen.. 1 by mouth qd 9)  Cefuroxime Axetil 250 Mg Tabs (Cefuroxime axetil) .Marland Kitchen.. 1 by mouth 2 times daily  Patient Instructions: 1)  Please schedule a follow-up appointment in 2 months. 2)  Try to eat more raw plant food, fresh and dry fruit, raw almonds, leafy vegetables, whole foods and less red meat, less animal fat. Poultry and fish is better for you than pork and beef. Avoid processed foods (canned soups, hot dogs, sausage, bacon , frozen dinners). Avoid corn syrup, high fructose syrup or aspartam and Splenda  containing drinks. Honey, Agave and Stevia are better sweeteners. Make your own  dressing with olive oil, wine vinegar, lemon juce, garlic etc. for your salads. 3)  Start taking a yoga class 4)  BMP prior to visit, ICD-9: 5)  Hepatic Panel prior to visit, ICD-9: 6)  TSH prior to visit, ICD-9: 7)  CBC w/ Diff prior to visit, ICD-9: 8)  HbgA1C prior to visit, ICD-9: 9)  ESR 10)  Vit B12  782.0 995.20 Prescriptions: CEFUROXIME AXETIL 250 MG  TABS (CEFUROXIME AXETIL) 1 by mouth 2 times daily  #14 x 0   Entered and Authorized by:   Tresa Garter MD   Signed by:   Tresa Garter MD on 03/01/2009   Method used:   Print then Give to Patient   RxID:   [id number] HYDROCODONE-ACETAMINOPHEN 5-325 MG TABS (HYDROCODONE-ACETAMINOPHEN) 1 by mouth up to 2 times per day as needed for pain  #60 x 1   Entered and Authorized by:   Tresa Garter MD   Signed by:   Tresa Garter MD on 03/01/2009   Method used:   Print then Give to Patient   RxID:   [id number] GABAPENTIN 100 MG CAPS (GABAPENTIN) 1 by mouth three times a day as needed feet burning   #90 x 3   Entered and Authorized by:   Tresa Garter MD   Signed by:   Tresa Garter MD on 03/01/2009   Method used:   Print then Give to Patient   RxID:   [id number] PREMPRO 0.625-2.5 MG TABS (CONJ ESTROG-MEDROXYPROGEST ACE) 1 by mouth qd  #30 x 6   Entered and Authorized by:   Tresa Garter MD   Signed by:   Tresa Garter MD on 03/01/2009   Method used:   Print then Give to Patient   RxID:   [id number] FUROSEMIDE 20 MG TABS (FUROSEMIDE) Take 1 tab by mouth every day as needed swelling in hands  #30 x 3   Entered and Authorized by:   Tresa Garter MD   Signed by:   Tresa Garter MD on 03/01/2009   Method used:   Print then Give to Patient   RxID:  [id number]  

## 2010-08-15 NOTE — Progress Notes (Signed)
  Phone Note Call from Patient Call back at [phone]   Reason for Call: Acute Illness Summary of Call: pt c/o cough/cold fever/no energy/congestion but now feels worse. Pt wants to be seen today but no available appts. Will give to nurse to call and try to work in providers schedule. pt was a [facility] patient. Initial call taken by: Mikey College CMA,  September 07, 2007 8:49 AM  Follow-up for Phone Call        called pt at [phone] there was no answer Follow-up by: Levon Hedger,  September 09, 2007 3:41 PM  Additional Follow-up for Phone Call Additional follow up Details #1::        called pt at [phone] there was no answer. Additional Follow-up by: Levon Hedger,  September 10, 2007 11:39 AM    Additional Follow-up for Phone Call Additional follow up Details #2::    spoke with pt she said she went to Urgent Care and seems to be getting better still has a bad cough.  I told her if she had any other issue or concerns to please give [place] a call Follow-up by: Levon Hedger,  September 14, 2007 2:22 PM

## 2010-08-15 NOTE — Assessment & Plan Note (Signed)
Summary: DULL HEADACHE/ TIRED/NWS  AVP'S PT/NWS   Vital Signs:  Patient profile:   65 year old female Height:      62 inches (157.48 cm) Weight:      197.12 pounds (89.60 kg) O2 Sat:      94 % on Room air Temp:     98.4 degrees F (36.89 degrees C) oral Pulse rate:   91 / minute BP sitting:   112 / 72  (left arm) Cuff size:   large  Vitals Entered By: Orlan Leavens (January 18, 2010 2:55 PM)  O2 Flow:  Room air CC: Headache/ Tired Is Patient Diabetic? No Pain Assessment Patient in pain? no        Primary Care Provider:  Tresa Garter MD  CC:  Headache/ Tired.  History of Present Illness: c/o fatigue - associated with mld diffuse daily headache -  diffuse body ache and myalgas but denies specific weakness or falls - no vision changes or unilateral deficits - no fever, no cough - denies med changes - no dizziness or syncope -  +hx same symptoms - "this is like i was before started on cymbalta years ago"   Clinical Review Panels:  CBC   WBC:  7.0 (12/18/2009)   RBC:  4.16 (12/18/2009)   Hgb:  13.3 (12/18/2009)   Hct:  38.6 (12/18/2009)   Platelets:  233.0 (12/18/2009)   MCV  92.7 (12/18/2009)   MCHC  34.5 (12/18/2009)   RDW  13.4 (12/18/2009)   PMN:  55.8 (12/18/2009)   Lymphs:  36.0 (12/18/2009)   Monos:  6.6 (12/18/2009)   Eosinophils:  1.2 (12/18/2009)   Basophil:  0.4 (12/18/2009)  Complete Metabolic Panel   Glucose:  128 (12/18/2009)   Sodium:  144 (12/18/2009)   Potassium:  4.1 (12/18/2009)   Chloride:  107 (12/18/2009)   CO2:  30 (12/18/2009)   BUN:  23 (12/18/2009)   Creatinine:  0.6 (12/18/2009)   Albumin:  4.1 (12/18/2009)   Total Protein:  6.5 (12/18/2009)   Calcium:  9.6 (12/18/2009)   Total Bili:  0.7 (12/18/2009)   Alk Phos:  94 (12/18/2009)   SGPT (ALT):  23 (12/18/2009)   SGOT (AST):  25 (12/18/2009)   Current Medications (verified): 1)  Aspirin 81 Mg  Tbec (Aspirin) .... One By Mouth Every Day 2)  Vitamin D3 1000 Unit  Tabs  (Cholecalciferol) .Marland Kitchen.. 1 By Mouth Daily 3)  Zolpidem Tartrate 10 Mg  Tabs (Zolpidem Tartrate) .... 1/2 or 1 By Mouth At [facility] Prn 4)  Cymbalta 30 Mg Cpep (Duloxetine Hcl) .Marland Kitchen.. 1 By Mouth Qd 5)  Furosemide 20 Mg Tabs (Furosemide) .... Take 1 Tab By Mouth Every Day As Needed Swelling in Hands 6)  Naproxen 500 Mg Tabs (Naproxen) .Marland Kitchen.. 1 By Mouth Two Times A Day Pc For Pain/arthritis 7)  Hydrocodone-Acetaminophen 10-325 Mg Tabs (Hydrocodone-Acetaminophen) .Marland Kitchen.. 1 By Mouth Two Times A Day By Mouth Two Times A Day As Needed Pain 8)  Pennsaid 1.5 % Soln (Diclofenac Sodium) .... 3-5 Gtt On Skin Three Times A Day For Pain 9)  Ranitidine Hcl 300 Mg Tabs (Ranitidine Hcl) .Marland Kitchen.. 1 By Mouth Once Daily For Indigestion  Allergies (verified): 1)  Zetia (Ezetimibe) 2)  Tramadol Hcl 3)  Wellbutrin (Bupropion Hcl)  Past History:  Past Medical History: Osteoarthritis Skin Tag - Left cheek on face , pt anxious about excision. 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 FLP 169/146/53/87  (10/06)   Non Cardiac Chest  Pain - normal Dobutamine and Myoview (last 4/06) Depression (01/02/2006) Low back pain Hypertension (07/15/2004) COPD Anxiety (07/15/2004) ESOTROPIA, LEFT EYE (ICD-378.01) Insomnia Menopause 2009 TOBACCO ABUSE (ICD-305.1) FIBROMYALGIA (ICD-729.1) Poss periph neuropathy LE of unknown etiol 2010 Migraines  Review of Systems  The patient denies fever, weight loss, vision loss, and hoarseness.    Physical Exam  General:  alert, well-developed, well-nourished, and cooperative to examination.    Lungs:  Normal respiratory effort, chest expands symmetrically. Lungs are clear to auscultation, no crackles or wheezes. Heart:  Normal rate and regular rhythm. S1 and S2 normal without gallop, murmur, click, rub or other extra sounds. Neurologic:  alert & oriented X3 and cranial nerves II-XII symetrically intact.  strength normal in all extremities, sensation intact to light  touch, and gait normal. speech fluent without dysarthria or aphasia; follows commands with good comprehension.    Impression & Recommendations:  Problem # 1:  FATIGUE (ICD-780.79) likely related to FM - see next - exam benign, labs benign from 6.2011, reviewed today with pt  Problem # 2:  FIBROMYALGIA (ICD-729.1)  increase cymbalta to 60mg  - new erx done Her updated medication list for this problem includes:    Aspirin 81 Mg Tbec (Aspirin) ..... One by mouth every day    Naproxen 500 Mg Tabs (Naproxen) .Marland Kitchen... 1 by mouth two times a day pc for pain/arthritis    Hydrocodone-acetaminophen 10-325 Mg Tabs (Hydrocodone-acetaminophen) .Marland Kitchen... 1 by mouth two times a day by mouth two times a day as needed pain  Orders: Prescription Created Electronically [phone])  Problem # 3:  HEADACHE (ICD-784.0) exam benign - reassurance provided - ok to use tylenol or occassional goody powder - tx FM as before (inc cymbalta dose) Her updated medication list for this problem includes:    Aspirin 81 Mg Tbec (Aspirin) ..... One by mouth every day    Naproxen 500 Mg Tabs (Naproxen) .Marland Kitchen... 1 by mouth two times a day pc for pain/arthritis    Hydrocodone-acetaminophen 10-325 Mg Tabs (Hydrocodone-acetaminophen) .Marland Kitchen... 1 by mouth two times a day by mouth two times a day as needed pain  Problem # 4:  OSTEOARTHRITIS (ICD-715.90) has recieved much relief of diffuse arthralgia symptoms in past with IM medrol - hx prior tx reviewed - will repeat same today Her updated medication list for this problem includes:    Aspirin 81 Mg Tbec (Aspirin) ..... One by mouth every day    Naproxen 500 Mg Tabs (Naproxen) .Marland Kitchen... 1 by mouth two times a day pc for pain/arthritis    Hydrocodone-acetaminophen 10-325 Mg Tabs (Hydrocodone-acetaminophen) .Marland Kitchen... 1 by mouth two times a day by mouth two times a day as needed pain  Orders: Depo- Medrol 80mg  (J1040) Admin of Therapeutic Inj  intramuscular or subcutaneous (29562) Prescription Created  Electronically [phone])  Complete Medication List: 1)  Aspirin 81 Mg Tbec (Aspirin) .... One by mouth every day 2)  Vitamin D3 1000 Unit Tabs (Cholecalciferol) .Marland Kitchen.. 1 by mouth daily 3)  Zolpidem Tartrate 10 Mg Tabs (Zolpidem tartrate) .... 1/2 or 1 by mouth at hs prn 4)  Cymbalta 60 Mg Cpep (Duloxetine hcl) .Marland Kitchen.. 1 by mouth once daily 5)  Furosemide 20 Mg Tabs (Furosemide) .... Take 1 tab by mouth every day as needed swelling in hands 6)  Naproxen 500 Mg Tabs (Naproxen) .Marland Kitchen.. 1 by mouth two times a day pc for pain/arthritis 7)  Hydrocodone-acetaminophen 10-325 Mg Tabs (Hydrocodone-acetaminophen) .Marland Kitchen.. 1 by mouth two times a day by mouth two times a day as needed  pain 8)  Pennsaid 1.5 % Soln (Diclofenac sodium) .... 3-5 gtt on skin three times a day for pain 9)  Ranitidine Hcl 300 Mg Tabs (Ranitidine hcl) .Marland Kitchen.. 1 by mouth once daily for indigestion  Patient Instructions: 1)  it was good to see you today. 2)  Medrol shot done today - 3)  increase cymbalta to 60mg  once daily - your prescription has been electronically submitted to your pharmacy. Please take as directed. Contact our office if you believe you're having problems with the medication(s).  4)  ok to use tylenol or occassional goody's as needed for headache pain 5)  Please schedule a follow-up appointment in 3-4 weeks with dr. Posey Rea to review medications and symptoms, call sooner if problems.  Prescriptions: CYMBALTA 60 MG CPEP (DULOXETINE HCL) 1 by mouth once daily  #30 x 3   Entered and Authorized by:   Newt Lukes MD   Signed by:   Newt Lukes MD on 01/18/2010   Method used:   Electronically to        [facility] [id number] (retail)       [address] [place], [place]  [address]       Ph: [phone]       Fax: [phone]   RxID:   [id number]    Medication Administration  Injection # 1:    Medication: Depo- Medrol 80mg     Diagnosis: OSTEOARTHRITIS (ICD-715.90)     Route: IM    Site: RUOQ gluteus    Exp Date: 10/2012    Lot #: [id number]    Mfr: [employer]    Patient tolerated injection without complications    Given by: Orlan Leavens (January 18, 2010 3:50 PM)  Orders Added: 1)  Depo- Medrol 80mg  [J1040] 2)  Admin of Therapeutic Inj  intramuscular or subcutaneous [96372] 3)  Est. Patient Level IV [84132] 4)  Prescription Created Electronically [phone]

## 2010-08-15 NOTE — Assessment & Plan Note (Signed)
Summary: HEADACHES/NWS   Vital Signs:  Patient profile:   65 year old female Height:      62 inches Weight:      199 pounds BMI:     36.53 O2 Sat:      95 % on Room air Temp:     98.4 degrees F oral Pulse rate:   90 / minute Pulse rhythm:   regular Resp:     16 per minute BP sitting:   118 / 70  (left arm) Cuff size:   large  Vitals Entered By: Lanier Prude, CMA(AAMA) (February 25, 2010 9:38 AM)  O2 Flow:  Room air CC: HA/Insomnia Is Patient Diabetic? No Comments pt is not taking Vit d, Zolpidem, Cymbalta or Ranitidine   Primary Care Provider:  Tresa Garter MD  CC:  HA/Insomnia.  History of Present Illness: C/o nightmares about dying, HAs, depression. Not better. C/o insomnia and tiredness.  She stopped Cymbalta, Zolpidem 1 wk ago C/o HR 100-110 at night, anxiety  Current Medications (verified): 1)  Aspirin 81 Mg  Tbec (Aspirin) .... One By Mouth Every Day 2)  Vitamin D3 1000 Unit  Tabs (Cholecalciferol) .Marland Kitchen.. 1 By Mouth Daily 3)  Zolpidem Tartrate 10 Mg  Tabs (Zolpidem Tartrate) .... 1/2 or 1 By Mouth At [facility] Prn 4)  Cymbalta 60 Mg Cpep (Duloxetine Hcl) .Marland Kitchen.. 1 By Mouth Once Daily 5)  Furosemide 20 Mg Tabs (Furosemide) .... Take 1 Tab By Mouth Every Day As Needed Swelling in Hands 6)  Naproxen 500 Mg Tabs (Naproxen) .Marland Kitchen.. 1 By Mouth Two Times A Day Pc For Pain/arthritis 7)  Hydrocodone-Acetaminophen 10-325 Mg Tabs (Hydrocodone-Acetaminophen) .Marland Kitchen.. 1 By Mouth Two Times A Day By Mouth Two Times A Day As Needed Pain 8)  Pennsaid 1.5 % Soln (Diclofenac Sodium) .... 3-5 Gtt On Skin Three Times A Day For Pain 9)  Ranitidine Hcl 300 Mg Tabs (Ranitidine Hcl) .Marland Kitchen.. 1 By Mouth Once Daily For Indigestion  Allergies (verified): 1)  Zetia (Ezetimibe) 2)  Tramadol Hcl 3)  Wellbutrin (Bupropion Hcl)  Past History:  Past Medical History: Last updated: 01/18/2010 Osteoarthritis Skin Tag - Left cheek on face , pt anxious about excision. Polyuria- CBG 101 (random) Eye problem-  Left eye lazy since childhood. FLP 169/146/53/87  (10/06)   Non Cardiac Chest Pain - normal Dobutamine and Myoview (last 4/06) Depression (01/02/2006) Low back pain Hypertension (07/15/2004) COPD Anxiety (07/15/2004) ESOTROPIA, LEFT EYE (ICD-378.01) Insomnia Menopause 2009 TOBACCO ABUSE (ICD-305.1) FIBROMYALGIA (ICD-729.1) Poss periph neuropathy LE of unknown etiol 2010 Migraines  Past Surgical History: Last updated: 11/28/2006 Cholecystectomy  Social History: Last updated: 01/18/2008 Retired Married Current Smoker 1 ppd Alcohol use-no  Social History: Reviewed history from 01/18/2008 and no changes required. Retired Married Current Smoker 1 ppd Alcohol use-no  Review of Systems       The patient complains of weight gain and dyspnea on exertion.  The patient denies fever and abdominal pain.         cravings for sweets  Physical Exam  General:  NAD overweight-appearing.   Eyes:  No corneal or conjunctival inflammation noted. EOMI. Perrla. Nose:  no airflow obstruction, no intranasal foreign body, no nasal polyps, no nasal mucosal lesions, no mucosal friability, no active bleeding or clots, no sinus percussion tenderness, nasal dischargemucosal pallor, and mucosal edema.   Mouth:  WNL Neck:  No deformities, masses, or tenderness noted. Lungs:  Normal respiratory effort, chest expands symmetrically. Lungs are clear to auscultation, no crackles or wheezes. Heart:  Normal rate and regular rhythm. S1 and S2 normal without gallop, murmur, click, rub or other extra sounds. Abdomen:  soft, some -tender in epig area, normal bowel sounds, no distention, no masses, no guarding, and no rigidity.   Msk:   B knees are tender w/ROM Lumbar-sacral spine is tender to palpation over paraspinal muscles and painfull with the ROM  L 4th finger triggers Extremities:  No edema B Neurologic:  alert & oriented X3 and cranial nerves II-XII symetrically intact.  strength normal in all  extremities, sensation intact to light touch, and gait normal. speech fluent without dysarthria or aphasia; follows commands with good comprehension.  Skin:  turgor normal, color normal, no rashes, no suspicious lesions, no ecchymoses, and no petechiae.   Cervical Nodes:  No lymphadenopathy noted Psych:  Cognition and judgment appear intact. Alert and cooperative with normal attention span and concentration. No apparent delusions, illusions, hallucinations, depressed   Impression & Recommendations:  Problem # 1:  FATIGUE (ICD-780.79) r/o OSA Assessment Deteriorated  Orders: Sleep Disorder Referral (Sleep Disorder) T-Vitamin D (25-Hydroxy) ([address]) TLB-Udip ONLY (81003-UDIP) TLB-A1C / Hgb A1C (Glycohemoglobin) (83036-A1C) TLB-B12, Serum-Total ONLY (09811-B14) TLB-BMP (Basic Metabolic Panel-BMET) (80048-METABOL) TLB-CBC Platelet - w/Differential (85025-CBCD) TLB-Hepatic/Liver Function Pnl (80076-HEPATIC) TLB-Sedimentation Rate (ESR) (85652-ESR) TLB-TSH (Thyroid Stimulating Hormone) (84443-TSH)  Problem # 2:  INSOMNIA, PERSISTENT (ICD-307.42) Assessment: Deteriorated Trazodone Orders: Sleep Disorder Referral (Sleep Disorder)  Problem # 3:  SWEATING (ICD-780.8) Assessment: Deteriorated We will try HRT. Risks vs benefits and controversies of a long term HRT use were discussed.  Orders: T-Vitamin D (25-Hydroxy) [phone]) TLB-Udip ONLY (81003-UDIP) TLB-A1C / Hgb A1C (Glycohemoglobin) (83036-A1C) TLB-B12, Serum-Total ONLY (86578-I69) TLB-BMP (Basic Metabolic Panel-BMET) (80048-METABOL) TLB-CBC Platelet - w/Differential (85025-CBCD) TLB-Hepatic/Liver Function Pnl (80076-HEPATIC) TLB-Sedimentation Rate (ESR) (85652-ESR) TLB-TSH (Thyroid Stimulating Hormone) (84443-TSH)  Problem # 4:  DEPRESSION (ICD-311) Assessment: Deteriorated Refused Psychiaatry consult The following medications were removed from the medication list:    Cymbalta 60 Mg Cpep (Duloxetine hcl) .Marland Kitchen... 1 by  mouth once daily Her updated medication list for this problem includes:    Trazodone Hcl 50 Mg Tabs (Trazodone hcl) .Marland Kitchen... 1 by mouth at bedtime (start with 1/2 tab at hs x 1 week first)  Complete Medication List: 1)  Aspirin 81 Mg Tbec (Aspirin) .... One by mouth every day 2)  Vitamin D3 1000 Unit Tabs (Cholecalciferol) .Marland Kitchen.. 1 by mouth daily 3)  Furosemide 20 Mg Tabs (Furosemide) .... Take 1 tab by mouth every day as needed swelling in hands 4)  Naproxen 500 Mg Tabs (Naproxen) .Marland Kitchen.. 1 by mouth two times a day pc for pain/arthritis 5)  Hydrocodone-acetaminophen 10-325 Mg Tabs (Hydrocodone-acetaminophen) .Marland Kitchen.. 1 by mouth two times a day by mouth two times a day as needed pain 6)  Pennsaid 1.5 % Soln (Diclofenac sodium) .... 3-5 gtt on skin three times a day for pain 7)  Ranitidine Hcl 300 Mg Tabs (Ranitidine hcl) .Marland Kitchen.. 1 by mouth once daily for indigestion 8)  Trazodone Hcl 50 Mg Tabs (Trazodone hcl) .Marland Kitchen.. 1 by mouth at bedtime (start with 1/2 tab at hs x 1 week first) 9)  Sumatriptan Succinate 100 Mg Tabs (Sumatriptan succinate) .Marland Kitchen.. 1 by mouth once daily as needed for migraine 10)  Premarin 0.625 Mg Tabs (Estrogens conjugated) .Marland Kitchen.. 1 by mouth once daily for sweats  Patient Instructions: 1)  Please schedule a follow-up appointment in 1 month. Prescriptions: PREMARIN 0.625 MG TABS (ESTROGENS CONJUGATED) 1 by mouth once daily for sweats  #30 x 6   Entered and Authorized  by:   Tresa Garter MD   Signed by:   Tresa Garter MD on 02/25/2010   Method used:   Electronically to        [facility] [id number] (retail)       [address] [place], [place]  [address]       Ph: [phone]       Fax: [phone]   RxID:   [id number] SUMATRIPTAN SUCCINATE 100 MG TABS (SUMATRIPTAN SUCCINATE) 1 by mouth once daily as needed for migraine  #6 x 12   Entered and Authorized by:   Tresa Garter MD   Signed by:   Tresa Garter MD on 02/25/2010    Method used:   Electronically to        [facility] [id number] (retail)       [address] [place], [place]  [address]       Ph: [phone]       Fax: [phone]   RxID:   [id number] TRAZODONE HCL 50 MG TABS (TRAZODONE HCL) 1 by mouth at bedtime (start with 1/2 tab at hs x 1 week first)  #30 x 6   Entered and Authorized by:   Tresa Garter MD   Signed by:   Tresa Garter MD on 02/25/2010   Method used:   Electronically to        [facility] [id number] (retail)       [address] [place], [place]  [address]       Ph: [phone]       Fax: [phone]   RxID:   [id number]

## 2010-08-15 NOTE — Assessment & Plan Note (Signed)
Summary: FU Amanda Arnold #   Vital Signs:  Patient profile:   65 year old female Weight:      193 pounds Temp:     97.3 degrees F oral Pulse rate:   92 / minute BP sitting:   144 / 82  (left arm)  Vitals Entered By: Tora Perches (July 11, 2009 10:18 AM) CC: f/u Is Patient Diabetic? No   Primary Care Provider:  Tresa Garter MD  CC:  f/u.  History of Present Illness: C/o HAs - not better C/o worsened LE pain, dull; Vicodin did not help. B knees hurt bad  Preventive Screening-Counseling & Management  Alcohol-Tobacco     Smoking Status: never  Current Medications (verified): 1)  Aspirin 81 Mg  Tbec (Aspirin) .... One By Mouth Every Day 2)  Vitamin D3 1000 Unit  Tabs (Cholecalciferol) .Marland Kitchen.. 1 By Mouth Daily 3)  Zolpidem Tartrate 10 Mg  Tabs (Zolpidem Tartrate) .... 1/2 or 1 By Mouth At [facility] Prn 4)  Hydrocodone-Acetaminophen 5-325 Mg Tabs (Hydrocodone-Acetaminophen) .Marland Kitchen.. 1 By Mouth Up To 2 Times Per Day As Needed For Pain 5)  Cymbalta 30 Mg Cpep (Duloxetine Hcl) .Marland Kitchen.. 1 By Mouth Qd 6)  Furosemide 20 Mg Tabs (Furosemide) .... Take 1 Tab By Mouth Every Day As Needed Swelling in Hands 7)  Prempro 0.625-2.5 Mg Tabs (Conj Estrog-Medroxyprogest Ace) .Marland Kitchen.. 1 By Mouth Qd 8)  Promethazine-Dm 6.25-15 Mg/80ml Syrp (Promethazine-Dm) .... 5-10 Ml By Mouth Qid As Needed For Cough 9)  Carbamazepine 200 Mg Tabs (Carbamazepine) .Marland Kitchen.. 1 By Mouth Two Times A Day For Neuralgia  Allergies: 1)  Zetia (Ezetimibe) 2)  Tramadol Hcl 3)  Wellbutrin (Bupropion Hcl)  Past History:  Past Surgical History: Last updated: 11/28/2006 Cholecystectomy  Family History: Last updated: 11/19/2007 Family History Hypertension No DM  Social History: Last updated: 01/18/2008 Retired Married Current Smoker 1 ppd Alcohol use-no  Past Medical History: Osteoarthritis Skin Tag - Left cheek on face , pt anxious about excision. 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 FLP  169/146/53/87  (10/06) Non Cardiac Chest Pain - normal Dobutamine and Myoview (last 4/06) Depression (01/02/2006) Low back pain Hypertension (07/15/2004) COPD Anxiety (07/15/2004) Current Problems:  ESOTROPIA, LEFT EYE (ICD-378.01) Insomnia Menopause 2009 TOBACCO ABUSE (ICD-305.1) FIBROMYALGIA (ICD-729.1) Poss periph neuropathy LE of unknown etiol 2010 Migraines  Review of Systems  The patient denies fever, chest pain, syncope, and dyspnea on exertion.    Physical Exam  General:  alert, well-hydrated, appropriate dress, good hygiene, and overweight-appearing.   Eyes:  No corneal or conjunctival inflammation noted. EOMI. Perrla. Ears:  External ear exam shows no significant lesions or deformities.  Otoscopic examination reveals clear canals, tympanic membranes are intact bilaterally without bulging, retraction, inflammation or discharge. Hearing is grossly normal bilaterally. Nose:  no airflow obstruction, no intranasal foreign body, no nasal polyps, no nasal mucosal lesions, no mucosal friability, no active bleeding or clots, no sinus percussion tenderness, nasal dischargemucosal pallor, and mucosal edema.   Mouth:  WNL Neck:  No deformities, masses, or tenderness noted. Lungs:  Normal respiratory effort, chest expands symmetrically. Lungs are clear to auscultation, no crackles or wheezes. Heart:  Normal rate and regular rhythm. S1 and S2 normal without gallop, murmur, click, rub or other extra sounds. Abdomen:  soft, non-tender, normal bowel sounds, no distention, no masses, no guarding, and no rigidity.   Msk:   B knees are tender w/ROM Lumbar-sacral spine is tender to palpation over paraspinal muscles and painfull with  the ROM  Extremities:  No edema B Neurologic:  No cranial nerve deficits noted. Station and gait are normal. Plantar reflexes are down-going bilaterally. DTRs are symmetrical throughout. Sensory, motor and coordinative functions appear intact. Skin:  turgor  normal, color normal, no rashes, no suspicious lesions, no ecchymoses, and no petechiae.   Psych:  Cognition and judgment appear intact. Alert and cooperative with normal attention span and concentration. No apparent delusions, illusions, hallucinations. Less depressed   Impression & Recommendations:  Problem # 1:  KNEE PAIN (ICD-719.46) B Assessment Deteriorated  The following medications were removed from the medication list:    Hydrocodone-acetaminophen 5-325 Mg Tabs (Hydrocodone-acetaminophen) .Marland Kitchen... 1 by mouth up to 2 times per day as needed for pain Her updated medication list for this problem includes:    Aspirin 81 Mg Tbec (Aspirin) ..... One by mouth every day    Naproxen 500 Mg Tabs (Naproxen) .Marland Kitchen... 1 by mouth two times a day pc for pain/arthritis    Hydrocodone-acetaminophen 10-325 Mg Tabs (Hydrocodone-acetaminophen) .Marland Kitchen... 1 by mouth two times a day by mouth two times a day as needed pain  Orders: Orthopedic Referral (Ortho)  Problem # 2:  LOW BACK PAIN (ICD-724.2) ? poss spinal stenosis Assessment: Deteriorated  The following medications were removed from the medication list:    Hydrocodone-acetaminophen 5-325 Mg Tabs (Hydrocodone-acetaminophen) .Marland Kitchen... 1 by mouth up to 2 times per day as needed for pain Her updated medication list for this problem includes:    Aspirin 81 Mg Tbec (Aspirin) ..... One by mouth every day    Naproxen 500 Mg Tabs (Naproxen) .Marland Kitchen... 1 by mouth two times a day pc for pain/arthritis    Hydrocodone-acetaminophen 10-325 Mg Tabs (Hydrocodone-acetaminophen) .Marland Kitchen... 1 by mouth two times a day by mouth two times a day as needed pain  Orders: Orthopedic Referral (Ortho)  Problem # 3:  EDEMA (ICD-782.3) Assessment: Improved  Her updated medication list for this problem includes:    Furosemide 20 Mg Tabs (Furosemide) .Marland Kitchen... Take 1 tab by mouth every day as needed swelling in hands  Problem # 4:  PARESTHESIA (ICD-782.0) Assessment: Unchanged  Problem # 5:   ARTHRALGIA (ICD-719.40) Assessment: Unchanged On prescription therapy   Problem # 6:  ANXIETY (ICD-300.00) Assessment: Unchanged  Her updated medication list for this problem includes:    Cymbalta 30 Mg Cpep (Duloxetine hcl) .Marland Kitchen... 1 by mouth qd  Complete Medication List: 1)  Aspirin 81 Mg Tbec (Aspirin) .... One by mouth every day 2)  Vitamin D3 1000 Unit Tabs (Cholecalciferol) .Marland Kitchen.. 1 by mouth daily 3)  Zolpidem Tartrate 10 Mg Tabs (Zolpidem tartrate) .... 1/2 or 1 by mouth at hs prn 4)  Cymbalta 30 Mg Cpep (Duloxetine hcl) .Marland Kitchen.. 1 by mouth qd 5)  Furosemide 20 Mg Tabs (Furosemide) .... Take 1 tab by mouth every day as needed swelling in hands 6)  Naproxen 500 Mg Tabs (Naproxen) .Marland Kitchen.. 1 by mouth two times a day pc for pain/arthritis 7)  Hydrocodone-acetaminophen 10-325 Mg Tabs (Hydrocodone-acetaminophen) .Marland Kitchen.. 1 by mouth two times a day by mouth two times a day as needed pain  Patient Instructions: 1)  Please schedule a follow-up appointment in 6 weeks. Prescriptions: HYDROCODONE-ACETAMINOPHEN 10-325 MG TABS (HYDROCODONE-ACETAMINOPHEN) 1 by mouth two times a day by mouth two times a day as needed pain  #60 x 2   Entered and Authorized by:   Tresa Garter MD   Signed by:   Tresa Garter MD on 07/11/2009   Method used:  Print then Give to Patient   RxID:   [id number] NAPROXEN 500 MG TABS (NAPROXEN) 1 by mouth two times a day pc for pain/arthritis  #60 x 3   Entered and Authorized by:   Tresa Garter MD   Signed by:   Tresa Garter MD on 07/11/2009   Method used:   Print then Give to Patient   RxID:   [id number]

## 2010-08-15 NOTE — Letter (Signed)
Summary: [facility]  [address]   [place], [place] [address]   Phone: [phone]  Fax: [phone]    07/30/2010  Amanda Arnold [address] [place], [place]  [address]  Dear Ms. Mccumbers,    We have attempted to contact you by phone but have been unable to reach you.  Please call our office at your earliest convenience so that we may schedule you for a follow up appointment with Dr. Shelle Iron.  Thank you       Sincerely,   [facility]

## 2010-08-15 NOTE — Progress Notes (Signed)
Summary: REQ FOR REFILL  Phone Note Call from Patient   Summary of Call: Pt called c/o continued head pain. She would like refill of medication given at ER, Prednisone 50mg  1 two times a day and Hydrocodone/apap 10/500.  Initial call taken by: Lamar Sprinkles, CMA,  June 14, 2009 2:37 PM  Follow-up for Phone Call        OK Hydrc/APAP #60. Stay off Predn for now. When is neurol appt? Follow-up by: Tresa Garter MD,  June 14, 2009 4:26 PM  Additional Follow-up for Phone Call Additional follow up Details #1::        Can I have details and directions? or refill what is already in EMR? Neurology apt has not been set up. Additional Follow-up by: Lamar Sprinkles, CMA,  June 14, 2009 5:09 PM    Additional Follow-up for Phone Call Additional follow up Details #2::    See meds Thx Follow-up by: Tresa Garter MD,  June 15, 2009 1:05 PM  Additional Follow-up for Phone Call Additional follow up Details #3:: Details for Additional Follow-up Action Taken: rx printed and awaiting signature. **RX placed in shredder and called to pt pharmacy. Pt aware re: Prednisone. Per pt called to [facility] on [address]. Additional Follow-up by: Lucious Groves, CMA,  June 15, 2009 1:53 PM  Prescriptions: HYDROCODONE-ACETAMINOPHEN 5-325 MG TABS (HYDROCODONE-ACETAMINOPHEN) 1 by mouth up to 2 times per day as needed for pain  #60 x 0   Entered and Authorized by:   Tresa Garter MD   Signed by:   Lamar Sprinkles, CMA on 06/15/2009   Method used:   Print then Give to Patient   RxID:   [id number]

## 2010-08-15 NOTE — Progress Notes (Signed)
Summary: Rf Vicodin  Phone Note Refill Request Message from:  Pharmacy  Refills Requested: Medication #1:  HYDROCODONE-ACETAMINOPHEN 5-500 MG TABS 1-2 by mouth two times a day as needed pain   Dosage confirmed as above?Dosage Confirmed   Supply Requested: 60   Last Refilled: 05/11/2010  Method Requested: Telephone to Pharmacy Next Appointment Scheduled: 06-17-10 Initial call taken by: Lanier Prude, [facility]),  June 13, 2010 12:47 PM  Follow-up for Phone Call        ok if time Follow-up by: Tresa Garter MD,  June 13, 2010 1:07 PM  Additional Follow-up for Phone Call Additional follow up Details #1::        Rx called to pharmacy Additional Follow-up by: Lanier Prude, [facility]),  June 13, 2010 2:31 PM    Prescriptions: HYDROCODONE-ACETAMINOPHEN 5-500 MG TABS (HYDROCODONE-ACETAMINOPHEN) 1-2 by mouth two times a day as needed pain  #60 x 1   Entered by:   Lanier Prude, CMA(AAMA)   Authorized by:   Tresa Garter MD   Signed by:   Lanier Prude, CMA(AAMA) on 06/13/2010   Method used:   Telephoned to ...       [facility] [id number] (retail)       [address] [place], [place]  [address]       Ph: [phone]       Fax: [phone]   RxID:   [id number]

## 2010-08-15 NOTE — Miscellaneous (Signed)
Summary: Procedure consent  Procedure consent   Imported By: Lester Alamo 06/18/2010 11:55:08  _____________________________________________________________________  External Attachment:    Type:   Image     Comment:   External Document

## 2010-08-15 NOTE — Assessment & Plan Note (Signed)
Summary: 3 mos f/u $50  cd   Vital Signs:  Patient Profile:   65 Years Old Female Height:     62 inches Weight:      195 pounds Temp:     98.8 degrees F oral Pulse rate:   112 / minute BP sitting:   116 / 80  (left arm)  Vitals Entered By: Tora Perches (July 28, 2008 10:41 AM)                 PCP:  Tresa Garter MD  Chief Complaint:  Multiple medical problems or concerns.  History of Present Illness: The patient presents for a follow up of back pain, anxiety, depression and headaches. Stopped Tramadol - palpitations; Cital and Bupr - " messing up w/my head. Had a UTI - got a Septra and Vicodin from UC C/o vag itching, itching of L palm    Prior Medications Reviewed Using: Medication Bottles  Prior Medication List:  CITALOPRAM HYDROBROMIDE 40 MG  TABS (CITALOPRAM HYDROBROMIDE) 1/2 by mouth qhs WELLBUTRIN SR 150 MG XR12H-TAB (BUPROPION HCL) 1 by mouth qam x 1 wk then 1 by mouth bid MICROZIDE 12.5 MG CAPS (HYDROCHLOROTHIAZIDE) 1 by mouth qam MELOXICAM 15 MG TABS (MELOXICAM) one by mouth daily for arthritis prn TRAMADOL HCL 50 MG  TABS (TRAMADOL HCL) 1 by mouth two times a day as needed pain ASPIRIN 81 MG  TBEC (ASPIRIN) one by mouth every day VITAMIN D3 1000 UNIT  TABS (CHOLECALCIFEROL) 1 by mouth daily ZOLPIDEM TARTRATE 10 MG  TABS (ZOLPIDEM TARTRATE) 1/2 or 1 by mouth at hs prn   Current Allergies (reviewed today): ZETIA (EZETIMIBE) TRAMADOL HCL WELLBUTRIN (BUPROPION HCL)  Past Medical History:    Reviewed history from 04/27/2008 and no changes required:       Osteoarthritis       Skin Tag - Left cheek on face , pt anxious about excision.       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       FLP 169/146/53/87  (10/06)       Non Cardiac Chest Pain - normal Dobutamine and Myoview (last 4/06)       Depression (01/02/2006)       Low back pain       Hypertension (07/15/2004)       COPD       Anxiety (07/15/2004)   Current Problems:        ESOTROPIA, LEFT EYE (ICD-378.01)       Insomnia       Menopause 2009       TOBACCO ABUSE (ICD-305.1)       FIBROMYALGIA (ICD-729.1)                 Family History:    Reviewed history from 11/19/2007 and no changes required:       Family History Hypertension       No DM  Social History:    Reviewed history from 01/18/2008 and no changes required:       Retired       Married       Current Smoker 1 ppd       Alcohol use-no    Review of Systems  The patient denies anorexia, chest pain, and dyspnea on exertion.     Physical Exam  General:     overweight-appearing.   Ears:     External ear exam shows no significant  lesions or deformities.  Otoscopic examination reveals clear canals, tympanic membranes are intact bilaterally without bulging, retraction, inflammation or discharge. Hearing is grossly normal bilaterally. Nose:     External nasal examination shows no deformity or inflammation. Nasal mucosa are pink and moist without lesions or exudates. Mouth:     Oral mucosa and oropharynx without lesions or exudates.  Teeth in good repair. Neck:     No deformities, masses, or tenderness noted. Lungs:     Normal respiratory effort, chest expands symmetrically. Lungs are clear to auscultation, no crackles or wheezes. Heart:     Tachy  S1 and S2 normal without gallop, murmur, click, rub or other extra sounds. Abdomen:     Bowel sounds positive,abdomen soft and non-tender without masses, organomegaly or hernias noted. Msk:     No deformity or scoliosis noted of thoracic or lumbar spine.   Extremities:     No clubbing, cyanosis, edema, or deformity noted with normal full range of motion of all joints.   Neurologic:     No cranial nerve deficits noted. Station and gait are normal. Plantar reflexes are down-going bilaterally. DTRs are symmetrical throughout. Sensory, motor and coordinative functions appear intact. Skin:     no rash on palm Psych:      Oriented X3, normally interactive, good eye contact, and slightly anxious.      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 1 by mouth two times a day as needed pain  Her updated medication list for this problem includes:    Meloxicam 15 Mg Tabs (Meloxicam) ..... One by mouth daily for arthritis prn    Aspirin 81 Mg Tbec (Aspirin) ..... One by mouth every day    Hydrocodone-acetaminophen 5-325 Mg Tabs (Hydrocodone-acetaminophen) .Marland Kitchen... 1 by mouth up to 2 times per day as needed for pain Risks vs benefits and controversies of a long term controlled substances use were discussed.    Problem # 4:  HYPERTENSION (ICD-401.9) Assessment: Comment Only  Her updated medication list for this problem includes:    Microzide 12.5 Mg Caps (Hydrochlorothiazide) .Marland Kitchen... 1 by mouth qam   Problem # 5:  PRURITUS (ICD-698.9) ? yeast Assessment: New Lotrisone x 3 wks two times a day   Problem # 6:  TACHYCARDIA (ICD-785.0) Assessment: Comment Only Check HR at home - bring record  Complete Medication List: 1)  Citalopram Hydrobromide 40 Mg Tabs (Citalopram hydrobromide) .... 1/2 by mouth qhs 2)  Microzide 12.5 Mg Caps (Hydrochlorothiazide) .Marland Kitchen.. 1 by mouth qam 3)  Meloxicam 15 Mg Tabs (Meloxicam) .... One by mouth daily for arthritis prn 4)  Aspirin 81 Mg Tbec (Aspirin) .... One by mouth every day 5)  Vitamin D3 1000 Unit Tabs (Cholecalciferol) .Marland Kitchen.. 1 by mouth daily 6)  Zolpidem Tartrate 10 Mg Tabs (Zolpidem tartrate) .... 1/2 or 1 by mouth at hs prn 7)  Lotrisone 1-0.05 % Crea (Clotrimazole-betamethasone) .... Use two times a day x 3 wks 8)  Hydrocodone-acetaminophen 5-325  Mg Tabs (Hydrocodone-acetaminophen) .Marland Kitchen.. 1 by mouth up to 2 times per day as needed for pain   Patient Instructions: 1)  Please schedule a follow-up appointment in 3 months. 2)  Check heart rate and record  Prescriptions: ZOLPIDEM TARTRATE 10 MG  TABS (ZOLPIDEM TARTRATE) 1/2 or 1 by mouth at hs prn  #30 x 2   Entered and Authorized by:   Tresa Garter MD   Signed by:   Tresa Garter MD on 07/28/2008   Method used:   Print then Give to Patient   RxID:   [id number] HYDROCODONE-ACETAMINOPHEN 5-325 MG TABS (HYDROCODONE-ACETAMINOPHEN) 1 by mouth up to 2 times per day as needed for pain  #60 x 2   Entered and Authorized by:   Tresa Garter MD   Signed by:   Tresa Garter MD on 07/28/2008   Method used:   Print then Give to Patient   RxID:   [id number] LOTRISONE 1-0.05 % CREA (CLOTRIMAZOLE-BETAMETHASONE) use two times a day x 3 wks  #90 g x 1   Entered and Authorized by:   Tresa Garter MD   Signed by:   Tresa Garter MD on 07/28/2008   Method used:   Print then Give to Patient   RxID:   [id number]

## 2010-08-15 NOTE — Assessment & Plan Note (Signed)
Summary: 3 MO ROV/ PT WANTED EARLY DEC/NWS  #   Vital Signs:  Patient profile:   65 year old female Height:      62 inches Weight:      208 pounds BMI:     38.18 Temp:     98.8 degrees F oral Pulse rate:   76 / minute Pulse rhythm:   regular Resp:     16 per minute BP sitting:   110 / 78  (left arm) Cuff size:   large  Vitals Entered By: Lanier Prude, CMA(AAMA) (June 17, 2010 10:05 AM)  Procedure Note  Injections: The patient complains of pain and inflammation. Indication: chronic pain Consent signed: yes  Procedure # 1: joint injection    Region: lateral    Location: R hip    Technique: 25 g needle    Medication: 80 mg depomedrol    Anesthesia: 3.0 ml 1% lidocaine w/o epinephrine    Comment: Risks including but not limited by incomplete procedure, bleeding, infection, recurrence were discussed with the patient. Consent form was signed. Tolerated well. Complicatons - none. Good pain relief following the procedure.   Cleaned and prepped with: alcohol and betadine Wound dressing: bandaid  CC: 1 mo f/u c/o Rt leg/thigh pain & "burning" Is Patient Diabetic? No   Primary Care Provider:  Tresa Garter MD  CC:  1 mo f/u c/o Rt leg/thigh pain & "burning".  History of Present Illness: F/u OSA, anxiety, obesity C/o R outer thigh burning pain and numbness  Preventive Screening-Counseling & Management  Alcohol-Tobacco     Smoking Status: quit  Current Medications (verified): 1)  Aspirin 81 Mg  Tbec (Aspirin) .... One By Mouth Every Day 2)  Vitamin D3 1000 Unit  Tabs (Cholecalciferol) .Marland Kitchen.. 1 By Mouth Daily 3)  Sumatriptan Succinate 100 Mg Tabs (Sumatriptan Succinate) .Marland Kitchen.. 1 By Mouth Once Daily As Needed For Migraine 4)  Premarin 0.625 Mg Tabs (Estrogens Conjugated) .Marland Kitchen.. 1 By Mouth Once Daily For Sweats As Needed 5)  Furosemide 20 Mg Tabs (Furosemide) .Marland Kitchen.. 1 By Mouth Qam As Needed For Swelling As Needed 6)  Hydrocodone-Acetaminophen 5-500 Mg Tabs  (Hydrocodone-Acetaminophen) .Marland Kitchen.. 1-2 By Mouth Two Times A Day As Needed Pain 7)  Metformin Hcl 500 Mg Tabs (Metformin Hcl) .Marland Kitchen.. 1 By Mouth Qam 8)  Toprol Xl 25 Mg Xr24h-Tab (Metoprolol Succinate) .Marland Kitchen.. 1 By Mouth Qd 9)  Alprazolam 0.5 Mg Tabs (Alprazolam) .... 0.5 - 1 By Mouth Two Times A Day As Needed Anxiety  Allergies (verified): 1)  Zetia (Ezetimibe) 2)  Tramadol Hcl 3)  Wellbutrin (Bupropion Hcl) 4)  Trazodone Hcl (Trazodone Hcl)  Past History:  Past Medical History: Last updated: 05/15/2010 Osteoarthritis Skin Tag - Left cheek on face , pt anxious about excision. 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    Physical Exam  General:  NAD overweight-appearing.   Nose:  no airflow obstruction, no intranasal foreign body, no nasal polyps, no nasal mucosal lesions, no mucosal friability, no active bleeding or clots, no sinus percussion tenderness, nasal dischargemucosal pallor, and mucosal edema.   Mouth:  WNL Lungs:  Normal respiratory effort, chest expands symmetrically. Lungs are clear to auscultation, no crackles or wheezes. Heart:  Normal rate and regular rhythm. S1 and S2 normal without gallop, murmur, click, rub or other extra  sounds. Abdomen:  soft, some -tender in epig area, normal bowel sounds, no distention, no masses, no guarding, and no rigidity.   Msk:  R hip pain on palpation laterally R>>L R anterolat thigh w/oval area of numbness Extremities:  No edema B Neurologic:  alert & oriented X3 and cranial nerves II-XII symetrically intact.  strength normal in all extremities, sensation intact to light touch, and gait normal. speech fluent without dysarthria or aphasia; follows commands with good comprehension.  Skin:  turgor normal, color normal, no rashes, no suspicious lesions, no ecchymoses, and no petechiae.   Psych:  Cognition and judgment appear intact. Alert and cooperative with normal attention span and concentration. 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 One by mouth every day    Hydrocodone-acetaminophen 5-500 Mg Tabs (Hydrocodone-acetaminophen) .Marland Kitchen... 1-2 by mouth two times a day as needed pain  Problem # 2:  OBESITY (ICD-278.00) Assessment: Deteriorated Diet discussed  Problem # 3:  CENTRAL SLEEP APNEA CONDS CLASSIFIED ELSEWHERE (ICD-327.27) Assessment: Improved CPAP  Problem # 4:  ANXIETY (ICD-300.00) Assessment: Unchanged  Her updated medication list for this problem includes:    Alprazolam 0.5 Mg Tabs (Alprazolam) .Marland Kitchen... 0.5 - 1 by mouth two times a day as needed anxiety  Problem # 5:  SWEATING (ICD-780.8) Assessment: Improved On the regimen of medicine(s) reflected in the chart    Complete Medication List: 1)  Aspirin 81 Mg Tbec (Aspirin) .... One by mouth every day 2)  Vitamin D3 1000 Unit Tabs (Cholecalciferol) .Marland Kitchen.. 1 by mouth daily 3)  Sumatriptan Succinate 100 Mg Tabs (Sumatriptan succinate) .Marland Kitchen.. 1 by mouth once  daily as needed for migraine 4)  Premarin 0.625 Mg Tabs (Estrogens conjugated) .Marland Kitchen.. 1 by mouth once daily for sweats as needed 5)  Furosemide 20 Mg Tabs (Furosemide) .Marland Kitchen.. 1 by mouth qam as needed for swelling as needed 6)  Hydrocodone-acetaminophen 5-500 Mg Tabs (Hydrocodone-acetaminophen) .Marland Kitchen.. 1-2 by mouth two times a day as needed pain 7)  Metformin Hcl 500 Mg Tabs (Metformin hcl) .Marland Kitchen.. 1 by mouth qam 8)  Toprol Xl 25 Mg Xr24h-tab (Metoprolol succinate) .Marland Kitchen.. 1 by mouth qd 9)  Alprazolam 0.5 Mg Tabs (Alprazolam) .... 0.5 - 1 by mouth two times a day as needed anxiety  Other Orders: Joint Aspirate / Injection, Large (20610) Depo- Medrol 80mg  (J1040)  Patient Instructions: 1)  Please schedule a follow-up appointment in 3 months.   Orders Added: 1)  Est. Patient Level IV [16109] 2)  Joint Aspirate / Injection, Large [20610] 3)  Depo- Medrol 80mg  [J1040]

## 2010-08-15 NOTE — Progress Notes (Signed)
  Phone Note Refill Request Message from:  Fax from Pharmacy on October 17, 2009 4:08 PM  Refills Requested: Medication #1:  HYDROCODONE-ACETAMINOPHEN 10-325 MG TABS 1 by mouth two times a day by mouth two times a day as needed pain. # 60   Last Refilled: 09/12/2009 [facility] [id number] Last ov 09/12/09  Next Appointment Scheduled: none Initial call taken by: Orlan Leavens,  October 17, 2009 4:09 PM  Follow-up for Phone Call        Is this ok to refill? Follow-up by: Orlan Leavens,  October 17, 2009 4:09 PM  Additional Follow-up for Phone Call Additional follow up Details #1::        OK x 2 Additional Follow-up by: Tresa Garter MD,  October 17, 2009 5:28 PM    Prescriptions: HYDROCODONE-ACETAMINOPHEN 10-325 MG TABS (HYDROCODONE-ACETAMINOPHEN) 1 by mouth two times a day by mouth two times a day as needed pain  #60 x 2   Entered by:   Rock Nephew CMA   Authorized by:   Tresa Garter MD   Signed by:   Rock Nephew CMA on 10/18/2009   Method used:   Telephoned to ...       [facility] [id number] (retail)       [address] [place], [place]  [address]       Ph: [phone]       Fax: [phone]   RxID:   [id number]

## 2010-08-15 NOTE — Assessment & Plan Note (Signed)
Summary: 2 MTH FU-$50-STC   Vital Signs:  Patient Profile:   65 Years Old Female Weight:      196 pounds Temp:     98.5 degrees F oral Pulse rate:   90 / minute BP sitting:   126 / 77  (left arm)  Vitals Entered By: Tora Perches (January 18, 2008 9:16 AM)                 Chief Complaint:  Multiple medical problems or concerns.  History of Present Illness: C/o LE throbbing, not sleeping well. C/o LBP, HTN. Has to move legs all the time. I'm ill during the day, irritable. Darvocet does not help. C/o sweating all the time, hot.    Current Allergies (reviewed today): ZETIA (EZETIMIBE)  Past Medical History:    Reviewed history from 11/19/2007 and no changes required:       Osteoarthritis       Skin Tag - Left cheek on face , pt anxious about excision.       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       FLP 169/146/53/87  (10/06)       Non Cardiac Chest Pain - normal Dobutamine and Myoview (last 4/06)       Depression (01/02/2006)       Low back pain       Hypertension (07/15/2004)       COPD       Anxiety (07/15/2004)       Current Problems:        ESOTROPIA, LEFT EYE (ICD-378.01)       TOBACCO ABUSE (ICD-305.1)       FIBROMYALGIA (ICD-729.1)                 Family History:    Reviewed history from 11/19/2007 and no changes required:       Family History Hypertension       No DM  Social History:    Reviewed history from 11/19/2007 and no changes required:       Retired       Married       Current Smoker 1 ppd       Alcohol use-no   Risk Factors:  Alcohol use:  no   Review of Systems  The patient denies anorexia, decreased hearing, hoarseness, chest pain, syncope, peripheral edema, abdominal pain, and hematochezia.         restless legs   Physical Exam  General:     overweight-appearing.   Head:     normocephalic.   Eyes:     vision grossly intact and pupils equal.   Ears:     R ear normal and L ear  normal.   Nose:     External nasal examination shows no deformity or inflammation. Nasal mucosa are pink and moist without lesions or exudates. Mouth:     Oral mucosa and oropharynx without lesions or exudates.  Teeth in good repair. Neck:     No deformities, masses, or tenderness noted. Lungs:     Normal respiratory effort, chest expands symmetrically. Lungs are clear to auscultation, no crackles or wheezes. Heart:     Normal rate and regular rhythm. S1 and S2 normal without gallop, murmur, click, rub or other extra sounds. Abdomen:     Bowel sounds positive,abdomen soft and non-tender without masses, organomegaly or hernias noted. Msk:     Lumbar-sacral spine  is tender to palpation over paraspinal muscles and painfull with the ROM  Neurologic:     No cranial nerve deficits noted. Station and gait are normal. Plantar reflexes are down-going bilaterally. DTRs are symmetrical throughout. Sensory, motor and coordinative functions appear intact. Skin:     Intact without suspicious lesions or rashes Psych:     normally interactive, good eye contact, and depressed affect.      Impression & Recommendations:  Problem # 1:  FIBROMYALGIA (ICD-729.1)  The following medications were removed from the medication list:    Darvocet-n 100 100-650 Mg Tabs (Propoxyphene n-apap) .Marland Kitchen... 1po bid as needed by mouth  Her updated medication list for this problem includes:    Aspirin 81 Mg Tbec (Aspirin) ..... One by mouth every day    Meloxicam 15 Mg Tabs (Meloxicam) ..... One by mouth daily for arthritis prn    Tramadol Hcl 50 Mg Tabs (Tramadol hcl) .Marland Kitchen... 1 by mouth two times a day as needed pain   Problem # 2:  LOW BACK PAIN (ICD-724.2)  The following medications were removed from the medication list:    Darvocet-n 100 100-650 Mg Tabs (Propoxyphene n-apap) .Marland Kitchen... 1po bid as needed by mouth  Her updated medication list for this problem includes:    Aspirin 81 Mg Tbec (Aspirin) ..... One by mouth  every day    Meloxicam 15 Mg Tabs (Meloxicam) ..... One by mouth daily for arthritis prn    Tramadol Hcl 50 Mg Tabs (Tramadol hcl) .Marland Kitchen... 1 by mouth two times a day as needed pain   Problem # 3:  DEPRESSION (ICD-311)  Her updated medication list for this problem includes:    Citalopram Hydrobromide 40 Mg Tabs (Citalopram hydrobromide) .Marland Kitchen... 1 by mouth qday    Trazodone Hcl 50 Mg Tabs (Trazodone hcl) .Marland Kitchen... 1 by mouth at bedtime as needed insomnia   Problem # 4:  ARTHRALGIA (ICD-719.40)  Problem # 5:  HYPERTENSION (ICD-401.9)  Her updated medication list for this problem includes:    Microzide 12.5 Mg Caps (Hydrochlorothiazide) .Marland Kitchen... 1 by mouth qam   Problem # 6:  COPD (ICD-496)  Problem # 7:  TOBACCO USE DISORDER/SMOKER-SMOKING CESSATION DISCUSSED (ICD-305.1) Assessment: Comment Only Encouraged smoking cessation and discussed different methods for smoking cessation.   Problem # 8:  SWEATING (ICD-780.8) Assessment: Unchanged Chronic, menopause related. Try Prempro 0.625/2.5 once daily. Risks vs benefits and controversies of a long term HRT use were discussed.   Complete Medication List: 1)  Citalopram Hydrobromide 40 Mg Tabs (Citalopram hydrobromide) .Marland Kitchen.. 1 by mouth qday 2)  Aspirin 81 Mg Tbec (Aspirin) .... One by mouth every day 3)  Vitamin D3 1000 Unit Tabs (Cholecalciferol) .Marland Kitchen.. 1 by mouth daily 4)  Microzide 12.5 Mg Caps (Hydrochlorothiazide) .Marland Kitchen.. 1 by mouth qam 5)  Meloxicam 15 Mg Tabs (Meloxicam) .... One by mouth daily for arthritis prn 6)  Tramadol Hcl 50 Mg Tabs (Tramadol hcl) .Marland Kitchen.. 1 by mouth two times a day as needed pain 7)  Prempro 0.625-2.5 Mg Tabs (Conj estrog-medroxyprogest ace) .Marland Kitchen.. 1 by mouth qd 8)  Trazodone Hcl 50 Mg Tabs (Trazodone hcl) .Marland Kitchen.. 1 by mouth at bedtime as needed insomnia  Other Orders: Tobacco use cessation intermediate 3-10 minutes (16109)   Patient Instructions: 1)  Please schedule a follow-up appointment in 2  months.   Prescriptions: TRAZODONE HCL 50 MG TABS (TRAZODONE HCL) 1 by mouth at bedtime as needed insomnia  #30 x 6   Entered and Authorized by:   Georgina Quint  Plotnikov MD   Signed by:   Tresa Garter MD on 01/18/2008   Method used:   Print then Give to Patient   RxID:   [id number] PREMPRO 0.625-2.5 MG  TABS (CONJ ESTROG-MEDROXYPROGEST ACE) 1 by mouth qd  #30 x 12   Entered and Authorized by:   Tresa Garter MD   Signed by:   Tresa Garter MD on 01/18/2008   Method used:   Print then Give to Patient   RxID:   [id number] TRAMADOL HCL 50 MG  TABS (TRAMADOL HCL) 1 by mouth two times a day as needed pain  #60 x 3   Entered and Authorized by:   Tresa Garter MD   Signed by:   Tresa Garter MD on 01/18/2008   Method used:   Print then Give to Patient   RxID:   [id number]  ]

## 2010-08-15 NOTE — Progress Notes (Signed)
Summary: Rf Ambien  Phone Note Refill Request Message from:  Fax from Pharmacy  Refills Requested: Medication #1:  ZOLPIDEM TARTRATE 10 MG  TABS 1/2 or 1 by mouth at hs prn   Dosage confirmed as above?Dosage Confirmed   Supply Requested: 30   Last Refilled: 12/17/2009  Method Requested: Telephone to Pharmacy Next Appointment Scheduled: none Initial call taken by: Lanier Prude, [facility]),  February 15, 2010 2:47 PM  Follow-up for Phone Call        ok 3 ref Follow-up by: Tresa Garter MD,  February 15, 2010 5:29 PM    Prescriptions: ZOLPIDEM TARTRATE 10 MG  TABS (ZOLPIDEM TARTRATE) 1/2 or 1 by mouth at hs prn  #30 x 3   Entered by:   Lamar Sprinkles, CMA   Authorized by:   Tresa Garter MD   Signed by:   Lamar Sprinkles, CMA on 02/15/2010   Method used:   Telephoned to ...       [facility] [id number] (retail)       [address] [place], [place]  [address]       Ph: [phone]       Fax: [phone]   RxID:   [id number]

## 2010-08-15 NOTE — Assessment & Plan Note (Signed)
Summary: EAR PAIN/ HEAD ACHE/ NWS   Vital Signs:  Patient profile:   65 year old female Weight:      194 pounds Temp:     97.7 degrees F oral Pulse rate:   87 / minute BP sitting:   110 / 74  (left arm)  Vitals Entered By: Tora Perches (May 15, 2009 9:12 AM) CC: ear pain Is Patient Diabetic? No   CC:  ear pain.  Preventive Screening-Counseling & Management  Alcohol-Tobacco     Smoking Status: never  Current Medications (verified): 1)  Aspirin 81 Mg  Tbec (Aspirin) .... One By Mouth Every Day 2)  Vitamin D3 1000 Unit  Tabs (Cholecalciferol) .Marland Kitchen.. 1 By Mouth Daily 3)  Zolpidem Tartrate 10 Mg  Tabs (Zolpidem Tartrate) .... 1/2 or 1 By Mouth At [facility] Prn 4)  Hydrocodone-Acetaminophen 5-325 Mg Tabs (Hydrocodone-Acetaminophen) .Marland Kitchen.. 1 By Mouth Up To 2 Times Per Day As Needed For Pain 5)  Cymbalta 30 Mg Cpep (Duloxetine Hcl) .Marland Kitchen.. 1 By Mouth Qd 6)  Furosemide 20 Mg Tabs (Furosemide) .... Take 1 Tab By Mouth Every Day As Needed Swelling in Hands 7)  Gabapentin 100 Mg Caps (Gabapentin) .Marland Kitchen.. 1 By Mouth Three Times A Day As Needed Feet Burning 8)  Prempro 0.625-2.5 Mg Tabs (Conj Estrog-Medroxyprogest Ace) .Marland Kitchen.. 1 By Mouth Qd 9)  Promethazine-Dm 6.25-15 Mg/83ml Syrp (Promethazine-Dm) .... 5-10 Ml By Mouth Qid As Needed For Cough 10)  Zovirax 400 Mg Tabs (Acyclovir) .Marland Kitchen.. 1 By Mouth Three Times A Day For A Cold Sore  Allergies: 1)  Zetia (Ezetimibe) 2)  Tramadol Hcl 3)  Wellbutrin (Bupropion Hcl)  Physical Exam  General:  alert, well-hydrated, appropriate dress, good hygiene, and overweight-appearing.   Head:  R TMJ  and R tempora area is sensitive Ears:  External ear exam shows no significant lesions or deformities.  Otoscopic examination reveals clear canals, tympanic membranes are intact bilaterally without bulging, retraction, inflammation or discharge. Hearing is grossly normal bilaterally. Mouth:  R lower lip w/a blister Neck:  No deformities, masses, or tenderness noted. Lungs:   Normal respiratory effort, chest expands symmetrically. Lungs are clear to auscultation, no crackles or wheezes. Heart:  Normal rate and regular rhythm. S1 and S2 normal without gallop, murmur, click, rub or other extra sounds. Abdomen:  soft, non-tender, normal bowel sounds, no distention, no masses, no guarding, and no rigidity.   Msk:    Lumbar-sacral spine is tender to palpation over paraspinal muscles and painfull with the ROM  Skin:  turgor normal, color normal, no rashes, no suspicious lesions, no ecchymoses, and no petechiae.   Psych:  Cognition and judgment appear intact. Alert and cooperative with normal attention span and concentration. No apparent delusions, illusions, hallucinations. 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 One by mouth every day    Hydrocodone-acetaminophen 5-325 Mg Tabs (Hydrocodone-acetaminophen) .Marland Kitchen... 1 by mouth up to 2 times per day as needed for pain    Indomethacin 50 Mg Caps (Indomethacin) .Marland KitchenMarland KitchenMarland KitchenMarland Kitchen  1 by mouth 2  times daily with food as needed pain  Complete Medication List: 1)  Aspirin 81 Mg Tbec (Aspirin) .... One by mouth every day 2)  Vitamin D3 1000 Unit Tabs (Cholecalciferol) .Marland Kitchen.. 1 by mouth daily 3)  Zolpidem Tartrate 10 Mg Tabs (Zolpidem tartrate) .... 1/2 or 1 by mouth at hs prn 4)  Hydrocodone-acetaminophen 5-325 Mg Tabs (Hydrocodone-acetaminophen) .Marland Kitchen.. 1 by mouth up to 2 times per day as needed for pain 5)  Cymbalta 30 Mg Cpep (Duloxetine hcl) .Marland Kitchen.. 1 by mouth qd 6)   Furosemide 20 Mg Tabs (Furosemide) .... Take 1 tab by mouth every day as needed swelling in hands 7)  Prempro 0.625-2.5 Mg Tabs (Conj estrog-medroxyprogest ace) .Marland Kitchen.. 1 by mouth qd 8)  Promethazine-dm 6.25-15 Mg/83ml Syrp (Promethazine-dm) .... 5-10 ml by mouth qid as needed for cough 9)  Zovirax 400 Mg Tabs (Acyclovir) .Marland Kitchen.. 1 by mouth three times a day for a cold sore 10)  Carbamazepine 200 Mg Tabs (Carbamazepine) .Marland Kitchen.. 1 by mouth two times a day for neuralgia 11)  Indomethacin 50 Mg Caps (Indomethacin) .Marland Kitchen.. 1 by mouth 2  times daily with food as needed pain  Patient Instructions: 1)  Call if you are not better in a reasonable amount of time or if worse. 2)  Please schedule a follow-up appointment in 1 month. Prescriptions: INDOMETHACIN 50 MG CAPS (INDOMETHACIN) 1 by mouth 2  times daily with food as needed pain  #60 x 0   Entered and Authorized by:   Tresa Garter MD   Signed by:   Tresa Garter MD on 05/15/2009   Method used:   Electronically to        [facility] [id number] (retail)       [address]       [place], [place]  [address]       Ph: [phone]       Fax: [phone]   RxID:   [id number] CARBAMAZEPINE 200 MG TABS (CARBAMAZEPINE) 1 by mouth two times a day for neuralgia  #30 x 0   Entered and Authorized by:   Tresa Garter MD   Signed by:   Tresa Garter MD on 05/15/2009   Method used:   Electronically to        [facility] [id number] (retail)       [address]       [place], [place]  [address]       Ph: [phone]       Fax: [phone]   RxID:   [id number]   Prevention & Chronic Care Immunizations   Influenza vaccine: declined  (04/14/2005)    Tetanus booster: Not documented    Pneumococcal vaccine: Pneumovax  (04/14/2005)    H. zoster vaccine: Not documented  Colorectal Screening   Hemoccult: Not documented    Colonoscopy: Not documented  Other Screening   Pap  smear: Not documented    Mammogram: Not documented    DXA bone density scan: Not documented   Smoking status: never  (05/15/2009)  Lipids   Total Cholesterol: Not documented   LDL: Not documented   LDL Direct: Not documented   HDL: Not documented   Triglycerides: Not documented    SGOT (AST): 30  (05/11/2009)   SGPT (ALT): 26  (05/11/2009)   Alkaline phosphatase: 98  (05/11/2009)  Total bilirubin: 0.6  (05/11/2009)  Hypertension   Last Blood Pressure: 110 / 74  (05/15/2009)   Serum creatinine: 0.7  (05/11/2009)   Serum potassium 4.5  (05/11/2009)  Self-Management Support :    Hypertension self-management support: Not documented    Lipid self-management support: Not documented

## 2010-08-15 NOTE — Progress Notes (Signed)
Summary: ELEVATED BP  Phone Note Call from Patient   Caller: Daughter Toney Reil [phone] Summary of Call: Pt's daughter called. Pt recenlty stopped atenolol, per pt and office notes. She has been c/o increased sweating. No pain or other complaints. BP today 129/118 HR 116 and 134/87 HR 107. Advised ER if had any symptoms of pain, lightheadedness, chest tightness or sob etc.... Initial call taken by: Lamar Sprinkles,  December 15, 2007 2:35 PM  Follow-up for Phone Call        Cont to monitor BP at home 2/wk; bring BP record to next apt. Loose wt NAS diet Follow-up by: Tresa Garter MD,  December 15, 2007 5:19 PM  Additional Follow-up for Phone Call Additional follow up Details #1::        Pt informed  Additional Follow-up by: Lamar Sprinkles,  December 15, 2007 6:08 PM

## 2010-08-15 NOTE — Assessment & Plan Note (Signed)
Summary: X A COUPLE OF DAYS NOT BREATHING WELL--STC   Vital Signs:  Patient profile:   65 year old female Height:      62 inches Weight:      203 pounds BMI:     37.26 O2 Sat:      96 % on Room air Temp:     98.6 degrees F oral Pulse rate:   83 / minute Pulse rhythm:   regular Resp:     16 per minute BP sitting:   130 / 80  (left arm) Cuff size:   regular  Vitals Entered By: Lanier Prude, CMA(AAMA) (March 15, 2010 4:51 PM)  O2 Flow:  Room air CC: chest pain radiating chest pain, SOB  Is Patient Diabetic? No   Primary Care Provider:  Georgina Quint Plotnikov MD  CC:  chest pain radiating chest pain and SOB .  History of Present Illness: C/o CP and back pain off and on Never took Premarin She stopped Trazodone  Can't breath when would lay down in bed  Current Medications (verified): 1)  Aspirin 81 Mg  Tbec (Aspirin) .... One By Mouth Every Day 2)  Vitamin D3 1000 Unit  Tabs (Cholecalciferol) .Marland Kitchen.. 1 By Mouth Daily 3)  Furosemide 20 Mg Tabs (Furosemide) .... Take 1 Tab By Mouth Every Day As Needed Swelling in Hands 4)  Naproxen 500 Mg Tabs (Naproxen) .Marland Kitchen.. 1 By Mouth Two Times A Day Pc For Pain/arthritis 5)  Hydrocodone-Acetaminophen 10-325 Mg Tabs (Hydrocodone-Acetaminophen) .Marland Kitchen.. 1 By Mouth Two Times A Day By Mouth Two Times A Day As Needed Pain 6)  Pennsaid 1.5 % Soln (Diclofenac Sodium) .... 3-5 Gtt On Skin Three Times A Day For Pain 7)  Ranitidine Hcl 300 Mg Tabs (Ranitidine Hcl) .Marland Kitchen.. 1 By Mouth Once Daily For Indigestion 8)  Trazodone Hcl 50 Mg Tabs (Trazodone Hcl) .Marland Kitchen.. 1 By Mouth At Bedtime (Start With 1/2 Tab At [facility] X 1 Week First) 9)  Sumatriptan Succinate 100 Mg Tabs (Sumatriptan Succinate) .Marland Kitchen.. 1 By Mouth Once Daily As Needed For Migraine 10)  Premarin 0.625 Mg Tabs (Estrogens Conjugated) .Marland Kitchen.. 1 By Mouth Once Daily For Sweats  Allergies (verified): 1)  Zetia (Ezetimibe) 2)  Tramadol Hcl 3)  Wellbutrin (Bupropion Hcl) 4)  Trazodone Hcl (Trazodone Hcl)  Past  History:  Past Medical History: Last updated: 01/18/2010 Osteoarthritis Skin Tag - Left cheek on face , pt anxious about excision. 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 FLP 169/146/53/87  (10/06)   Non Cardiac Chest Pain - normal Dobutamine and Myoview (last 4/06) Depression (01/02/2006) Low back pain Hypertension (07/15/2004) COPD Anxiety (07/15/2004) ESOTROPIA, LEFT EYE (ICD-378.01) Insomnia Menopause 2009 TOBACCO ABUSE (ICD-305.1) FIBROMYALGIA (ICD-729.1) Poss periph neuropathy LE of unknown etiol 2010 Migraines  Social History: Last updated: 01/18/2008 Retired Married Current Smoker 1 ppd Alcohol use-no  Review of Systems       The patient complains of dyspnea on exertion.  The patient denies fever, chest pain, syncope, abdominal pain, muscle weakness, and difficulty walking.    Physical Exam  General:  NAD overweight-appearing.   Nose:  no airflow obstruction, no intranasal foreign body, no nasal polyps, no nasal mucosal lesions, no mucosal friability, no active bleeding or clots, no sinus percussion tenderness, nasal dischargemucosal pallor, and mucosal edema.   Mouth:  WNL Lungs:  Normal respiratory effort, chest expands symmetrically. Lungs are clear to auscultation, no crackles or wheezes. Heart:  Normal rate and regular rhythm. S1 and S2 normal  without gallop, murmur, click, rub or other extra sounds. Abdomen:  soft, some -tender in epig area, normal bowel sounds, no distention, no masses, no guarding, and no rigidity.   Msk:   B knees are tender w/ROM Lumbar-sacral spine is tender to palpation over paraspinal muscles and painfull with the ROM  L 4th finger triggers Extremities:  No edema B Neurologic:  alert & oriented X3 and cranial nerves II-XII symetrically intact.  strength normal in all extremities, sensation intact to light touch, and gait normal. speech fluent without dysarthria or aphasia; follows commands with good  comprehension.    Impression & Recommendations:  Problem # 1:  DYSPNEA (ICD-786.05) Assessment Deteriorated  Sleep test is pending   Orders: TLB-BMP (Basic Metabolic Panel-BMET) (80048-METABOL) TLB-BNP (B-Natriuretic Peptide) (83880-BNPR) TLB-TSH (Thyroid Stimulating Hormone) (84443-TSH) CK Isoenzymes (19147-82956) Cardiolite (Cardiolite)  Problem # 2:  CHEST PAIN (ICD-786.50) Assessment: Unchanged  Orders: EKG w/ Interpretation (93000) TLB-BMP (Basic Metabolic Panel-BMET) (80048-METABOL) TLB-BNP (B-Natriuretic Peptide) (83880-BNPR) TLB-TSH (Thyroid Stimulating Hormone) (84443-TSH) CK Isoenzymes (21308-65784) Cardiolite (Cardiolite)  Problem # 3:  FATIGUE (ICD-780.79) Assessment: Unchanged  Orders: TLB-BMP (Basic Metabolic Panel-BMET) (80048-METABOL) TLB-BNP (B-Natriuretic Peptide) (83880-BNPR) TLB-TSH (Thyroid Stimulating Hormone) (84443-TSH) CK Isoenzymes (69629-52841) Cardiolite (Cardiolite)  Problem # 4:  INSOMNIA, PERSISTENT (ICD-307.42) Assessment: Unchanged  Complete Medication List: 1)  Aspirin 81 Mg Tbec (Aspirin) .... One by mouth every day 2)  Vitamin D3 1000 Unit Tabs (Cholecalciferol) .Marland Kitchen.. 1 by mouth daily 3)  Hydrocodone-acetaminophen 10-325 Mg Tabs (Hydrocodone-acetaminophen) .Marland Kitchen.. 1 by mouth two times a day by mouth two times a day as needed pain 4)  Pennsaid 1.5 % Soln (Diclofenac sodium) .... 3-5 gtt on skin three times a day for pain 5)  Ranitidine Hcl 300 Mg Tabs (Ranitidine hcl) .Marland Kitchen.. 1 by mouth once daily for indigestion 6)  Sumatriptan Succinate 100 Mg Tabs (Sumatriptan succinate) .Marland Kitchen.. 1 by mouth once daily as needed for migraine 7)  Premarin 0.625 Mg Tabs (Estrogens conjugated) .Marland Kitchen.. 1 by mouth once daily for sweats 8)  Temazepam 15 Mg Caps (Temazepam) .Marland Kitchen.. 1-2 by mouth at bedtime as needed insomnia 9)  Furosemide 20 Mg Tabs (Furosemide) .Marland Kitchen.. 1 by mouth qam as needed for swelling as needed 10)  Klor-con M10 10 Meq Cr-tabs (Potassium chloride  crys cr) .Marland Kitchen.. 1 by mouth qd   Patient Instructions: 1)  Call if you are not better in a reasonable amount of time or if worse. Go to ER if feeling really bad!  2)  Please schedule a follow-up appointment in 2 weeks. Prescriptions: KLOR-CON M10 10 MEQ CR-TABS (POTASSIUM CHLORIDE CRYS CR) 1 by mouth qd  #90 x 3   Entered and Authorized by:   Tresa Garter MD   Signed by:   Tresa Garter MD on 03/15/2010   Method used:   Print then Give to Patient   RxID:   [id number] FUROSEMIDE 20 MG TABS (FUROSEMIDE) 1 by mouth qam as needed for swelling as needed  #30 x 6   Entered and Authorized by:   Tresa Garter MD   Signed by:   Tresa Garter MD on 03/15/2010   Method used:   Print then Give to Patient   RxID:   [id number] TEMAZEPAM 15 MG CAPS (TEMAZEPAM) 1-2 by mouth at bedtime as needed insomnia  #60 x 2   Entered and Authorized by:   Tresa Garter MD   Signed by:   Tresa Garter MD on 03/15/2010   Method used:   Print  then Give to Patient   RxID:   [id number]

## 2010-08-15 NOTE — Assessment & Plan Note (Signed)
Summary: low back pain,leg pain/$50/cd   Vital Signs:  Patient profile:   65 year old female Weight:      198 pounds Temp:     99.2 degrees F oral Pulse rate:   88 / minute BP sitting:   120 / 80  (left arm)  Vitals Entered By: Tora Perches (September 19, 2008 11:37 AM) Is Patient Diabetic? No   Primary Care Provider:  Tresa Garter MD   History of Present Illness: The patient presents for a follow up of back pain, anxiety, depression and headaches, HTN. F/u sweating - better, tachycardia - better   Problems Prior to Update: 1)  Tachycardia  (ICD-785.0) 2)  Pruritus  (ICD-698.9) 3)  Screening For Malignant Neoplasm of The Cervix  (ICD-V76.2) 4)  Uti  (ICD-599.0) 5)  Cystitis  (ICD-595.9) 6)  Insomnia, Persistent  (ICD-307.42) 7)  Sweating  (ICD-780.8) 8)  Bronchitis, Acute  (ICD-466.0) 9)  Fatigue  (ICD-780.79) 10)  Cough  (ICD-786.2) 11)  Headache  (ICD-784.0) 12)  Arthralgia  (ICD-719.40) 13)  Paresthesia  (ICD-782.0) 14)  Esotropia, Left Eye  (ICD-378.01) 15)  Tobacco Abuse  (ICD-305.1) 16)  Fibromyalgia  (ICD-729.1) 17)  Dysesthesia  (ICD-782.0) 18)  Low Back Pain  (ICD-724.2) 19)  Hyperlipidemia  (ICD-272.4) 20)  Depression  (ICD-311) 21)  Polyuria  (ICD-788.42) 22)  Osteoarthritis  (ICD-715.90) 23)  Hypertension  (ICD-401.9) 24)  COPD  (ICD-496) 25)  Anxiety  (ICD-300.00) 26)  Tobacco Use Disorder/smoker-smoking Cessation Discussed  (ICD-305.1)  Medications Prior to Update: 1)  Citalopram Hydrobromide 40 Mg  Tabs (Citalopram Hydrobromide) .... 1/2 By Mouth Qhs 2)  Microzide 12.5 Mg Caps (Hydrochlorothiazide) .Marland Kitchen.. 1 By Mouth Qam 3)  Meloxicam 15 Mg Tabs (Meloxicam) .... One By Mouth Daily For Arthritis Prn 4)  Aspirin 81 Mg  Tbec (Aspirin) .... One By Mouth Every Day 5)  Vitamin D3 1000 Unit  Tabs (Cholecalciferol) .Marland Kitchen.. 1 By Mouth Daily 6)  Zolpidem Tartrate 10 Mg  Tabs (Zolpidem Tartrate) .... 1/2 or 1 By Mouth At [facility] Prn 7)  Lotrisone 1-0.05 % Crea  (Clotrimazole-Betamethasone) .... Use Two Times A Day X 3 Wks 8)  Hydrocodone-Acetaminophen 5-325 Mg Tabs (Hydrocodone-Acetaminophen) .Marland Kitchen.. 1 By Mouth Up To 2 Times Per Day As Needed For Pain  Current Medications (verified): 1)  Citalopram Hydrobromide 40 Mg  Tabs (Citalopram Hydrobromide) .... 1/2 By Mouth Qhs 2)  Microzide 12.5 Mg Caps (Hydrochlorothiazide) .Marland Kitchen.. 1 By Mouth Qam 3)  Meloxicam 15 Mg Tabs (Meloxicam) .... One By Mouth Daily For Arthritis Prn 4)  Aspirin 81 Mg  Tbec (Aspirin) .... One By Mouth Every Day 5)  Vitamin D3 1000 Unit  Tabs (Cholecalciferol) .Marland Kitchen.. 1 By Mouth Daily 6)  Zolpidem Tartrate 10 Mg  Tabs (Zolpidem Tartrate) .... 1/2 or 1 By Mouth At [facility] Prn 7)  Lotrisone 1-0.05 % Crea (Clotrimazole-Betamethasone) .... Use Two Times A Day X 3 Wks 8)  Hydrocodone-Acetaminophen 5-325 Mg Tabs (Hydrocodone-Acetaminophen) .Marland Kitchen.. 1 By Mouth Up To 2 Times Per Day As Needed For Pain  Allergies: 1)  Zetia (Ezetimibe) 2)  Tramadol Hcl 3)  Wellbutrin (Bupropion Hcl)  Physical Exam  General:  overweight-appearing.   Nose:  External nasal examination shows no deformity or inflammation. Nasal mucosa are pink and moist without lesions or exudates. Mouth:  Oral mucosa and oropharynx without lesions or exudates.  Teeth in good repair. Neck:  No deformities, masses, or tenderness noted. Lungs:  Normal respiratory effort, chest expands symmetrically. Lungs are clear to  auscultation, no crackles or wheezes. Heart:  Tachy  S1 and S2 normal without gallop, murmur, click, rub or other extra sounds. Abdomen:  Bowel sounds positive,abdomen soft and non-tender without masses, organomegaly or hernias noted. Msk:  No deformity or scoliosis noted of thoracic or lumbar spine.  Lumbar-sacral spine is tender to palpation over paraspinal muscles and painfull with the ROM  Neurologic:  No cranial nerve deficits noted. Station and gait are normal. Plantar reflexes are down-going bilaterally. DTRs are  symmetrical throughout. Sensory, motor and coordinative functions appear intact. Skin:  cold sore on a lip w/crust Psych:  Oriented X3 and depressed affect.     Impression & Recommendations:  Problem # 1:  TACHYCARDIA (ICD-785.0) Assessment Improved On prescription therapy   Problem # 2:  INSOMNIA, PERSISTENT (ICD-307.42) Assessment: Improved On prescription therapy   Problem # 3:  ARTHRALGIA (ICD-719.40) Assessment: Comment Only Rhem. cons  Problem # 4:  LOW BACK PAIN (ICD-724.2) Assessment: Unchanged  Her updated medication list for this problem includes:    Meloxicam 15 Mg Tabs (Meloxicam) ..... One by mouth daily for arthritis prn    Aspirin 81 Mg Tbec (Aspirin) ..... One by mouth every day    Hydrocodone-acetaminophen 5-325 Mg Tabs (Hydrocodone-acetaminophen) .Marland Kitchen... 1 by mouth up to 2 times per day as needed for pain  Orders: Rheumatology Referral (Rheumatology)  Problem # 5:  HYPERLIPIDEMIA (ICD-272.4) Assessment: Comment Only  Complete Medication List: 1)  Microzide 12.5 Mg Caps (Hydrochlorothiazide) .Marland Kitchen.. 1 by mouth qam 2)  Meloxicam 15 Mg Tabs (Meloxicam) .... One by mouth daily for arthritis prn 3)  Aspirin 81 Mg Tbec (Aspirin) .... One by mouth every day 4)  Vitamin D3 1000 Unit Tabs (Cholecalciferol) .Marland Kitchen.. 1 by mouth daily 5)  Zolpidem Tartrate 10 Mg Tabs (Zolpidem tartrate) .... 1/2 or 1 by mouth at hs prn 6)  Lotrisone 1-0.05 % Crea (Clotrimazole-betamethasone) .... Use two times a day x 3 wks 7)  Hydrocodone-acetaminophen 5-325 Mg Tabs (Hydrocodone-acetaminophen) .Marland Kitchen.. 1 by mouth up to 2 times per day as needed for pain 8)  Savella 50 Mg Tabs (Milnacipran hcl) .Marland Kitchen.. 1 by mouth bid 9)  Triamcinolone Acetonide 0.5 % Crea (Triamcinolone acetonide) .... Apply bid to affected area  Patient Instructions: 1)  Please schedule a follow-up appointment in 2 months. 2)  The medication list was reviewed and reconciled.  All changed / newly prescribed medications were  explained.  A complete medication list was provided to the patient / caregiver.  5Prescriptions: HYDROCODONE-ACETAMINOPHEN 5-325 MG TABS (HYDROCODONE-ACETAMINOPHEN) 1 by mouth up to 2 times per day as needed for pain  #60 x 2   Entered and Authorized by:   Tresa Garter MD   Signed by:   Tresa Garter MD on 09/19/2008   Method used:   Print then Give to Patient   RxID:   [id number] TRIAMCINOLONE ACETONIDE 0.5 % CREA (TRIAMCINOLONE ACETONIDE) apply bid to affected area  #45 g x 1   Entered and Authorized by:   Tresa Garter MD   Signed by:   Tresa Garter MD on 09/19/2008   Method used:   Print then Give to Patient   RxID:   [id number] SAVELLA 50 MG TABS (MILNACIPRAN HCL) 1 by mouth bid  #60 x 6   Entered and Authorized by:   Tresa Garter MD   Signed by:   Tresa Garter MD on 09/19/2008   Method used:   Print then Give to Patient   RxID:  [id number]  

## 2010-08-15 NOTE — Letter (Signed)
Summary: Generic Letter  [facility]  [address] [place], [place] [address]   Phone: [phone]  Fax: [phone]    02/22/2008  TO: Trial [address] [employer] Attn.: Lonn Georgia Request [id number], [place], [place] [address]  RE: [place] [address] [place], [place]  [address]    Dear Sharee Pimple,   Please, excuse Ms Cranmer  from jury duty on   9/14/9         , juror number  [phone]    due to medical reasons.            Sincerely,   Jacinta Shoe MD [facility]

## 2010-08-15 NOTE — Assessment & Plan Note (Signed)
Summary: headache   fast heart rate  stc   Vital Signs:  Patient profile:   65 year old female Height:      62 inches Weight:      204 pounds BMI:     37.45 Temp:     98.3 degrees F oral Pulse rate:   96 / minute Pulse rhythm:   irregular Resp:     16 per minute BP sitting:   118 / 70  (left arm) Cuff size:   large  Vitals Entered By: Lanier Prude, CMA(AAMA) (May 15, 2010 4:07 PM) CC: HA, neck pain, rapid heart rate X 2 days Is Patient Diabetic? No   Primary Care Provider:  Georgina Quint Plotnikov MD  CC:  HA, neck pain, and rapid heart rate X 2 days.  History of Present Illness: The patient presents for a follow up of OSA, anxiety, depression and headaches.   Current Medications (verified): 1)  Aspirin 81 Mg  Tbec (Aspirin) .... One By Mouth Every Day 2)  Vitamin D3 1000 Unit  Tabs (Cholecalciferol) .Marland Kitchen.. 1 By Mouth Daily 3)  Sumatriptan Succinate 100 Mg Tabs (Sumatriptan Succinate) .Marland Kitchen.. 1 By Mouth Once Daily As Needed For Migraine 4)  Premarin 0.625 Mg Tabs (Estrogens Conjugated) .Marland Kitchen.. 1 By Mouth Once Daily For Sweats As Needed 5)  Temazepam 15 Mg Caps (Temazepam) .Marland Kitchen.. 1-2 By Mouth At Bedtime As Needed Insomnia 6)  Furosemide 20 Mg Tabs (Furosemide) .Marland Kitchen.. 1 By Mouth Qam As Needed For Swelling As Needed 7)  Hydrocodone-Acetaminophen 5-500 Mg Tabs (Hydrocodone-Acetaminophen) .Marland Kitchen.. 1-2 By Mouth Two Times A Day As Needed Pain  Allergies (verified): 1)  Zetia (Ezetimibe) 2)  Tramadol Hcl 3)  Wellbutrin (Bupropion Hcl) 4)  Trazodone Hcl (Trazodone Hcl)  Past History:  Past Medical History: Osteoarthritis Skin Tag - Left cheek on face , pt anxious about excision. 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 FLP 169/146/53/87  (10/06)   Non Cardiac Chest Pain - normal Dobutamine and Myoview (last 4/06) Depression (01/02/2006) Low back pain Hypertension (07/15/2004) COPD Anxiety (07/15/2004) ESOTROPIA, LEFT EYE  (ICD-378.01) Insomnia Menopause 2009 TOBACCO ABUSE (ICD-305.1) FIBROMYALGIA (ICD-729.1) Poss periph neuropathy LE of unknown etiol 2010 Migraines Psychosomatic issues 2011 OSA 2012  Review of Systems       The patient complains of weight gain.  The patient denies fever, chest pain, syncope, and abdominal pain.    Physical Exam  General:  NAD overweight-appearing.   Nose:  no airflow obstruction, no intranasal foreign body, no nasal polyps, no nasal mucosal lesions, no mucosal friability, no active bleeding or clots, no sinus percussion tenderness, nasal dischargemucosal pallor, and mucosal edema.   Mouth:  WNL Neck:  No deformities, masses, or tenderness noted. Lungs:  Normal respiratory effort, chest expands symmetrically. Lungs are clear to auscultation, no crackles or wheezes. Heart:  Normal rate and regular rhythm. S1 and S2 normal without gallop, murmur, click, rub or other extra sounds. Abdomen:  soft, some -tender in epig area, normal bowel sounds, no distention, no masses, no guarding, and no rigidity.   Neurologic:  alert & oriented X3 and cranial nerves II-XII symetrically intact.  strength normal in all extremities, sensation intact to light touch, and gait normal. speech fluent without dysarthria or aphasia; follows commands with good comprehension.    Impression & Recommendations:  Problem # 1:  TACHYCARDIA (ICD-785.0) Assessment Unchanged  Orders: TLB-BMP (Basic Metabolic Panel-BMET) (80048-METABOL) TLB-TSH (Thyroid Stimulating Hormone) (84443-TSH) TLB-Udip ONLY (81003-UDIP)  Problem #  2:  HEADACHE (ICD-784.0) Assessment: Deteriorated Treat OSA Her updated medication list for this problem includes:    Aspirin 81 Mg Tbec (Aspirin) ..... One by mouth every day    Sumatriptan Succinate 100 Mg Tabs (Sumatriptan succinate) .Marland Kitchen... 1 by mouth once daily as needed for migraine    Hydrocodone-acetaminophen 5-500 Mg Tabs (Hydrocodone-acetaminophen) .Marland Kitchen... 1-2 by mouth two  times a day as needed pain    Toprol Xl 25 Mg Xr24h-tab (Metoprolol succinate) .Marland Kitchen... 1 by mouth qd  Orders: TLB-BMP (Basic Metabolic Panel-BMET) (80048-METABOL) TLB-TSH (Thyroid Stimulating Hormone) (84443-TSH) TLB-Udip ONLY (81003-UDIP)  Problem # 3:  OBSTRUCTIVE SLEEP APNEA (ICD-327.23) Assessment: New CPAP is pending   Problem # 4:  HYPERGLYCEMIA (ICD-790.29) Assessment: Deteriorated  Her updated medication list for this problem includes:    Metformin Hcl 500 Mg Tabs (Metformin hcl) .Marland Kitchen... 1 by mouth qam  Complete Medication List: 1)  Aspirin 81 Mg Tbec (Aspirin) .... One by mouth every day 2)  Vitamin D3 1000 Unit Tabs (Cholecalciferol) .Marland Kitchen.. 1 by mouth daily 3)  Sumatriptan Succinate 100 Mg Tabs (Sumatriptan succinate) .Marland Kitchen.. 1 by mouth once daily as needed for migraine 4)  Premarin 0.625 Mg Tabs (Estrogens conjugated) .Marland Kitchen.. 1 by mouth once daily for sweats as needed 5)  Furosemide 20 Mg Tabs (Furosemide) .Marland Kitchen.. 1 by mouth qam as needed for swelling as needed 6)  Hydrocodone-acetaminophen 5-500 Mg Tabs (Hydrocodone-acetaminophen) .Marland Kitchen.. 1-2 by mouth two times a day as needed pain 7)  Metformin Hcl 500 Mg Tabs (Metformin hcl) .Marland Kitchen.. 1 by mouth qam 8)  Toprol Xl 25 Mg Xr24h-tab (Metoprolol succinate) .Marland Kitchen.. 1 by mouth qd 9)  Alprazolam 0.5 Mg Tabs (Alprazolam) .... 0.5 - 1 by mouth two times a day as needed anxiety  Patient Instructions: 1)  Please schedule a follow-up appointment in 1 month. Prescriptions: TOPROL XL 25 MG XR24H-TAB (METOPROLOL SUCCINATE) 1 by mouth qd  #30 x 6   Entered and Authorized by:   Tresa Garter MD   Signed by:   Tresa Garter MD on 05/15/2010   Method used:   Print then Give to Patient   RxID:   [id number] ALPRAZOLAM 0.5 MG TABS (ALPRAZOLAM) 0.5 - 1 by mouth two times a day as needed anxiety  #60 x 2   Entered and Authorized by:   Tresa Garter MD   Signed by:   Tresa Garter MD on 05/15/2010   Method used:   Print then Give to  Patient   RxID:   [id number] METFORMIN HCL 500 MG TABS (METFORMIN HCL) 1 by mouth qam  #30 x 12   Entered and Authorized by:   Tresa Garter MD   Signed by:   Tresa Garter MD on 05/15/2010   Method used:   Print then Give to Patient   RxID:   [id number]    Orders Added: 1)  TLB-BMP (Basic Metabolic Panel-BMET) [80048-METABOL] 2)  TLB-TSH (Thyroid Stimulating Hormone) [84443-TSH] 3)  TLB-Udip ONLY [81003-UDIP] 4)  Est. Patient Level IV [34742]

## 2010-08-15 NOTE — Assessment & Plan Note (Signed)
Summary: 2 MO ROV /NWS $50   Vital Signs:  Patient Profile:   65 Years Old Female Height:     62 inches Weight:      196 pounds Temp:     97.4 degrees F oral Pulse rate:   86 / minute BP sitting:   120 / 84  (left arm)  Vitals Entered By: Tora Perches (April 27, 2008 10:29 AM)                 PCP:  Tresa Garter MD  Chief Complaint:  Multiple medical problems or concerns.  History of Present Illness: The patient presents for a follow up of back pain, HTN, anxiety, depression and headaches. C/o insomnia     Current Allergies (reviewed today): ZETIA (EZETIMIBE)  Past Medical History:    Reviewed history from 02/22/2008 and no changes required:       Osteoarthritis       Skin Tag - Left cheek on face , pt anxious about excision.       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       FLP 169/146/53/87  (10/06)       Non Cardiac Chest Pain - normal Dobutamine and Myoview (last 4/06)       Depression (01/02/2006)       Low back pain       Hypertension (07/15/2004)       COPD       Anxiety (07/15/2004)       Current Problems:        ESOTROPIA, LEFT EYE (ICD-378.01)       Insomnia       Menopause 2009       TOBACCO ABUSE (ICD-305.1)       FIBROMYALGIA (ICD-729.1)                 Family History:    Reviewed history from 11/19/2007 and no changes required:       Family History Hypertension       No DM  Social History:    Reviewed history from 01/18/2008 and no changes required:       Retired       Married       Current Smoker 1 ppd       Alcohol use-no     Physical Exam  General:     overweight-appearing.   Eyes:     No corneal or conjunctival inflammation noted. EOMI. Perrla. Funduscopic exam benign, without hemorrhages, exudates or papilledema. Vision grossly normal. Mouth:     Oral mucosa and oropharynx without lesions or exudates.  Teeth in good repair. Neck:     No deformities, masses, or tenderness  noted. Lungs:     Normal respiratory effort, chest expands symmetrically. Lungs are clear to auscultation, no crackles or wheezes. Heart:     Normal rate and regular rhythm. S1 and S2 normal without gallop, murmur, click, rub or other extra sounds. Abdomen:     Bowel sounds positive,abdomen soft and non-tender without masses, organomegaly or hernias noted. Msk:     Lumbar-sacral spine is tender to palpation over paraspinal muscles and painfull with the ROM  Extremities:     No clubbing, cyanosis, edema, or deformity noted with normal full range of motion of all joints.   Neurologic:     No cranial nerve deficits noted. Station and gait are normal. Plantar reflexes are down-going bilaterally. DTRs  are symmetrical throughout. Sensory, motor and coordinative functions appear intact. Skin:     aging changes Psych:     Cognition and judgment appear intact. Alert and cooperative with normal attention span and concentration. No apparent delusions, illusions, hallucinations    Impression & Recommendations:  Problem # 1:  INSOMNIA, PERSISTENT (ICD-307.42) Assessment: Deteriorated Ambien to try  Problem # 2:  ARTHRALGIA (ICD-719.40) Assessment: Unchanged  Problem # 3:  SWEATING (ICD-780.8) Assessment: Improved  Problem # 4:  FIBROMYALGIA (ICD-729.1) Assessment: Unchanged  Her updated medication list for this problem includes:    Meloxicam 15 Mg Tabs (Meloxicam) ..... One by mouth daily for arthritis prn    Tramadol Hcl 50 Mg Tabs (Tramadol hcl) .Marland Kitchen... 1 by mouth two times a day as needed pain    Aspirin 81 Mg Tbec (Aspirin) ..... One by mouth every day   Complete Medication List: 1)  Citalopram Hydrobromide 40 Mg Tabs (Citalopram hydrobromide) .... 1/2 by mouth qhs 2)  Wellbutrin Sr 150 Mg Xr12h-tab (Bupropion hcl) .Marland Kitchen.. 1 by mouth qam x 1 wk then 1 by mouth bid 3)  Microzide 12.5 Mg Caps (Hydrochlorothiazide) .Marland Kitchen.. 1 by mouth qam 4)  Meloxicam 15 Mg Tabs (Meloxicam) .... One by  mouth daily for arthritis prn 5)  Tramadol Hcl 50 Mg Tabs (Tramadol hcl) .Marland Kitchen.. 1 by mouth two times a day as needed pain 6)  Aspirin 81 Mg Tbec (Aspirin) .... One by mouth every day 7)  Vitamin D3 1000 Unit Tabs (Cholecalciferol) .Marland Kitchen.. 1 by mouth daily 8)  Zolpidem Tartrate 10 Mg Tabs (Zolpidem tartrate) .... 1/2 or 1 by mouth at hs prn   Patient Instructions: 1)  Please schedule a follow-up appointment in 3 months. 2)  BMP prior to visit, ICD-9: 3)  Hepatic Panel prior to visit, ICD-9: 4)  TSH prior to visit, ICD-9: 995.2   Prescriptions: ZOLPIDEM TARTRATE 10 MG  TABS (ZOLPIDEM TARTRATE) 1/2 or 1 by mouth at hs prn  #30 x 3   Entered and Authorized by:   Tresa Garter MD   Signed by:   Tresa Garter MD on 04/27/2008   Method used:   Print then Give to Patient   RxID:   [id number]  ]

## 2010-08-15 NOTE — Assessment & Plan Note (Signed)
Summary: 6 WK ROV /NWS  #   Vital Signs:  Patient profile:   65 year old female Weight:      196 pounds Temp:     98.4 degrees F oral Pulse rate:   80 / minute BP sitting:   124 / 80  (left arm)  Vitals Entered By: Tora Perches (August 22, 2009 10:00 AM) CC: f/u Is Patient Diabetic? No   Primary Care Provider:  Tresa Garter MD  CC:  f/u.  History of Present Illness: The patient presents for a follow up of back pain, anxiety, depression and headaches. Feeling better w/better compl w/Vit D; pain pills help. C/o R LBP off and on  Preventive Screening-Counseling & Management  Alcohol-Tobacco     Smoking Status: never  Current Medications (verified): 1)  Aspirin 81 Mg  Tbec (Aspirin) .... One By Mouth Every Day 2)  Vitamin D3 1000 Unit  Tabs (Cholecalciferol) .Marland Kitchen.. 1 By Mouth Daily 3)  Zolpidem Tartrate 10 Mg  Tabs (Zolpidem Tartrate) .... 1/2 or 1 By Mouth At [facility] Prn 4)  Cymbalta 30 Mg Cpep (Duloxetine Hcl) .Marland Kitchen.. 1 By Mouth Qd 5)  Furosemide 20 Mg Tabs (Furosemide) .... Take 1 Tab By Mouth Every Day As Needed Swelling in Hands 6)  Naproxen 500 Mg Tabs (Naproxen) .Marland Kitchen.. 1 By Mouth Two Times A Day Pc For Pain/arthritis 7)  Hydrocodone-Acetaminophen 10-325 Mg Tabs (Hydrocodone-Acetaminophen) .Marland Kitchen.. 1 By Mouth Two Times A Day By Mouth Two Times A Day As Needed Pain  Allergies: 1)  Zetia (Ezetimibe) 2)  Tramadol Hcl 3)  Wellbutrin (Bupropion Hcl)  Past History:  Past Medical History: Last updated: 07/11/2009 Osteoarthritis Skin Tag - Left cheek on face , pt anxious about excision. 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 FLP 169/146/53/87  (10/06) Non Cardiac Chest Pain - normal Dobutamine and Myoview (last 4/06) Depression (01/02/2006) Low back pain Hypertension (07/15/2004) COPD Anxiety (07/15/2004) Current Problems:  ESOTROPIA, LEFT EYE (ICD-378.01) Insomnia Menopause 2009 TOBACCO ABUSE (ICD-305.1) FIBROMYALGIA (ICD-729.1) Poss  periph neuropathy LE of unknown etiol 2010 Migraines  Social History: Last updated: 01/18/2008 Retired Married Current Smoker 1 ppd Alcohol use-no  Review of Systems       The patient complains of weight gain.  The patient denies dyspnea on exertion.    Physical Exam  General:  alert, well-hydrated, appropriate dress, good hygiene, and overweight-appearing.   Nose:  no airflow obstruction, no intranasal foreign body, no nasal polyps, no nasal mucosal lesions, no mucosal friability, no active bleeding or clots, no sinus percussion tenderness, nasal dischargemucosal pallor, and mucosal edema.   Mouth:  WNL Lungs:  Normal respiratory effort, chest expands symmetrically. Lungs are clear to auscultation, no crackles or wheezes. Heart:  Normal rate and regular rhythm. S1 and S2 normal without gallop, murmur, click, rub or other extra sounds. Abdomen:  soft, non-tender, normal bowel sounds, no distention, no masses, no guarding, and no rigidity.   Msk:   B knees are tender w/ROM Lumbar-sacral spine is tender to palpation over paraspinal muscles and painfull with the ROM  Extremities:  No edema B Neurologic:  No cranial nerve deficits noted. Station and gait are normal. Plantar reflexes are down-going bilaterally. DTRs are symmetrical throughout. Sensory, motor and coordinative functions appear intact. Skin:  turgor normal, color normal, no rashes, no suspicious lesions, no ecchymoses, and no petechiae.   Psych:  Cognition and judgment appear intact. Alert and cooperative with normal attention span and concentration. No apparent  delusions, illusions, hallucinations. Less depressed   Impression & Recommendations:  Problem # 1:  KNEE PAIN (ICD-719.46) Assessment Improved  Her updated medication list for this problem includes:    Aspirin 81 Mg Tbec (Aspirin) ..... One by mouth every day    Naproxen 500 Mg Tabs (Naproxen) .Marland Kitchen... 1 by mouth two times a day pc for pain/arthritis     Hydrocodone-acetaminophen 10-325 Mg Tabs (Hydrocodone-acetaminophen) .Marland Kitchen... 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 One by mouth every day    Naproxen 500 Mg Tabs (Naproxen) .Marland Kitchen... 1 by mouth two times a day pc for pain/arthritis    Hydrocodone-acetaminophen 10-325 Mg Tabs (Hydrocodone-acetaminophen) .Marland Kitchen... 1 by mouth two times a day by mouth two times a day as needed pain  Problem # 4:  FIBROMYALGIA (ICD-729.1) Assessment: Improved  Her updated medication list for this problem includes:    Aspirin 81 Mg Tbec (Aspirin) ..... One by mouth every day    Naproxen 500 Mg Tabs (Naproxen) .Marland Kitchen... 1 by mouth two times a day pc for pain/arthritis    Hydrocodone-acetaminophen 10-325 Mg Tabs (Hydrocodone-acetaminophen) .Marland Kitchen... 1 by mouth two times a day by mouth two times a day as needed pain  Problem # 5:  DEPRESSION (ICD-311) Assessment: Improved  Her updated medication list for this problem includes:    Cymbalta 30 Mg Cpep (Duloxetine hcl) .Marland Kitchen... 1 by mouth qd  Problem # 6:  TOBACCO USE DISORDER/SMOKER-SMOKING CESSATION DISCUSSED (ICD-305.1) Assessment: Comment Only Chantix info Encouraged smoking cessation and discussed different methods for smoking cessation.   Complete Medication List: 1)  Aspirin 81 Mg Tbec (Aspirin) .... One by mouth every day 2)  Vitamin D3 1000 Unit Tabs (Cholecalciferol) .Marland Kitchen.. 1 by mouth daily 3)  Zolpidem Tartrate 10 Mg Tabs (Zolpidem tartrate) .... 1/2 or 1 by mouth at hs prn 4)  Cymbalta 30 Mg Cpep (Duloxetine hcl) .Marland Kitchen.. 1 by mouth qd 5)  Furosemide 20 Mg Tabs (Furosemide) .... Take 1 tab by mouth every day as needed swelling in hands 6)  Naproxen 500 Mg Tabs (Naproxen) .Marland Kitchen.. 1 by mouth two times a day pc for  pain/arthritis 7)  Hydrocodone-acetaminophen 10-325 Mg Tabs (Hydrocodone-acetaminophen) .Marland Kitchen.. 1 by mouth two times a day by mouth two times a day as needed pain  Patient Instructions: 1)  Please schedule a follow-up appointment in 3 months.

## 2010-08-15 NOTE — Assessment & Plan Note (Signed)
Summary: headache/plot/cd   Vital Signs:  Patient profile:   65 year old female Weight:      195 pounds Temp:     98.8 degrees F oral Pulse rate:   81 / minute BP sitting:   112 / 68  (left arm)  Vitals Entered By: Tora Perches (April 13, 2009 8:25 AM) CC: h/a Is Patient Diabetic? No   Primary Care Provider:  Tresa Garter MD  CC:  h/a.  History of Present Illness: The patient presents for a follow up of back pain, anxiety, depression and headaches. Hot flashes resolved, but now has tender breasts   Current Medications (verified): 1)  Aspirin 81 Mg  Tbec (Aspirin) .... One By Mouth Every Day 2)  Vitamin D3 1000 Unit  Tabs (Cholecalciferol) .Marland Kitchen.. 1 By Mouth Daily 3)  Zolpidem Tartrate 10 Mg  Tabs (Zolpidem Tartrate) .... 1/2 or 1 By Mouth At [facility] Prn 4)  Hydrocodone-Acetaminophen 5-325 Mg Tabs (Hydrocodone-Acetaminophen) .Marland Kitchen.. 1 By Mouth Up To 2 Times Per Day As Needed For Pain 5)  Cymbalta 30 Mg Cpep (Duloxetine Hcl) .Marland Kitchen.. 1 By Mouth Qd 6)  Furosemide 20 Mg Tabs (Furosemide) .... Take 1 Tab By Mouth Every Day As Needed Swelling in Hands 7)  Gabapentin 100 Mg Caps (Gabapentin) .Marland Kitchen.. 1 By Mouth Three Times A Day As Needed Feet Burning 8)  Prempro 0.625-2.5 Mg Tabs (Conj Estrog-Medroxyprogest Ace) .Marland Kitchen.. 1 By Mouth Qd 9)  Promethazine-Dm 6.25-15 Mg/8ml Syrp (Promethazine-Dm) .... 5-10 Ml By Mouth Qid As Needed For Cough  Allergies: 1)  Zetia (Ezetimibe) 2)  Tramadol Hcl 3)  Wellbutrin (Bupropion Hcl)  Past History:  Social History: Last updated: 01/18/2008 Retired Married Current Smoker 1 ppd Alcohol use-no  Past Medical History: Osteoarthritis Skin Tag - Left cheek on face , pt anxious about excision. 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 FLP 169/146/53/87  (10/06) Non Cardiac Chest Pain - normal Dobutamine and Myoview (last 4/06) Depression (01/02/2006) Low back pain Hypertension (07/15/2004) COPD Anxiety  (07/15/2004) Current Problems:  ESOTROPIA, LEFT EYE (ICD-378.01) Insomnia Menopause 2009 TOBACCO ABUSE (ICD-305.1) FIBROMYALGIA (ICD-729.1) Poss periph neuropathy LE of unknown etiol 2010 Migraines  Social History: Reviewed history from 01/18/2008 and no changes required. Retired Married Current Smoker 1 ppd Alcohol use-no  Physical Exam  General:  alert, well-developed, well-nourished, well-hydrated, appropriate dress, good hygiene, and overweight-appearing.   Ears:  R ear normal and L ear normal.   Nose:  no airflow obstruction, no intranasal foreign body, no nasal polyps, no nasal mucosal lesions, no mucosal friability, no active bleeding or clots, no sinus percussion tenderness, nasal dischargemucosal pallor, and mucosal edema.   Mouth:  good dentition and no gingival abnormalities.   Neck:  supple, full ROM, no masses, no carotid bruits, no cervical lymphadenopathy, and no neck tenderness.   Lungs:  Normal respiratory effort, chest expands symmetrically. Lungs are clear to auscultation, no crackles or wheezes. Heart:  Normal rate and regular rhythm. S1 and S2 normal without gallop, murmur, click, rub or other extra sounds. Abdomen:  soft, non-tender, normal bowel sounds, no distention, no masses, no guarding, and no rigidity.   Msk:  normal ROM, no joint tenderness, no joint swelling, no joint warmth, and no redness over joints.  Lumbar-sacral spine is tender to palpation over paraspinal muscles and painfull with the ROM  Neurologic:  No cranial nerve deficits noted. Station and gait are normal. Plantar reflexes are down-going bilaterally. DTRs are symmetrical throughout. Sensory, motor and  coordinative functions appear intact. Skin:  turgor normal, color normal, no rashes, no suspicious lesions, no ecchymoses, and no petechiae.   Psych:  Cognition and judgment appear intact. Alert and cooperative with normal attention span and concentration. No apparent delusions, illusions,  hallucinations. Less depressed   Impression & Recommendations:  Problem # 1:  BREAST PAIN (ICD-611.71) B - due to HRT Assessment New Take Prempro 1/2 once daily or qod  Problem # 2:  SWEATING (ICD-780.8) Assessment: Improved Refused a flu shot  Problem # 3:  HEADACHE (ICD-784.0) Assessment: Improved  Her updated medication list for this problem includes:    Aspirin 81 Mg Tbec (Aspirin) ..... One by mouth every day    Hydrocodone-acetaminophen 5-325 Mg Tabs (Hydrocodone-acetaminophen) .Marland Kitchen... 1 by mouth up to 2 times per day as needed for pain  Problem # 4:  FATIGUE (ICD-780.79) Assessment: Improved  Problem # 5:  FIBROMYALGIA (ICD-729.1) Assessment: Comment Only  Her updated medication list for this problem includes:    Aspirin 81 Mg Tbec (Aspirin) ..... One by mouth every day    Hydrocodone-acetaminophen 5-325 Mg Tabs (Hydrocodone-acetaminophen) .Marland Kitchen... 1 by mouth up to 2 times per day as needed for pain  Problem # 6:  EDEMA (ICD-782.3) Assessment: Improved  Her updated medication list for this problem includes:    Furosemide 20 Mg Tabs (Furosemide) .Marland Kitchen... Take 1 tab by mouth every day as needed swelling in hands  Problem # 7:  TACHYCARDIA (ICD-785.0) Assessment: Improved  Complete Medication List: 1)  Aspirin 81 Mg Tbec (Aspirin) .... One by mouth every day 2)  Vitamin D3 1000 Unit Tabs (Cholecalciferol) .Marland Kitchen.. 1 by mouth daily 3)  Zolpidem Tartrate 10 Mg Tabs (Zolpidem tartrate) .... 1/2 or 1 by mouth at hs prn 4)  Hydrocodone-acetaminophen 5-325 Mg Tabs (Hydrocodone-acetaminophen) .Marland Kitchen.. 1 by mouth up to 2 times per day as needed for pain 5)  Cymbalta 30 Mg Cpep (Duloxetine hcl) .Marland Kitchen.. 1 by mouth qd 6)  Furosemide 20 Mg Tabs (Furosemide) .... Take 1 tab by mouth every day as needed swelling in hands 7)  Gabapentin 100 Mg Caps (Gabapentin) .Marland Kitchen.. 1 by mouth three times a day as needed feet burning 8)  Prempro 0.625-2.5 Mg Tabs (Conj estrog-medroxyprogest ace) .Marland Kitchen.. 1 by mouth  qd 9)  Promethazine-dm 6.25-15 Mg/33ml Syrp (Promethazine-dm) .... 5-10 ml by mouth qid as needed for cough  Patient Instructions: 1)  Use stretching exercises that I have provided (15 min. or longer every day) 2)  Try to eat more raw plant food, fresh and dry fruit, raw almonds, leafy vegetables, whole foods and less red meat, less animal fat. Poultry and fish is better for you than pork and beef. Avoid processed foods (canned soups, hot dogs, sausage, bacon , frozen dinners). Avoid corn syrup, high fructose syrup or aspartam and Splenda  containing drinks. Honey, Agave and Stevia are better sweeteners. Make your own  dressing with olive oil, wine vinegar, lemon juce, garlic etc. for your salads. 3)  Please schedule a follow-up appointment in 6 wks. 4)  Take Prempro 1/2 once daily or every other day

## 2010-08-21 NOTE — Letter (Signed)
Summary: [facility]   Imported By: Sherian Rein 08/15/2010 09:03:29  _____________________________________________________________________  External Attachment:    Type:   Image     Comment:   External Document

## 2010-08-23 ENCOUNTER — Encounter: Payer: Self-pay | Admitting: Pulmonary Disease

## 2010-08-23 ENCOUNTER — Ambulatory Visit (INDEPENDENT_AMBULATORY_CARE_PROVIDER_SITE_OTHER): Payer: PRIVATE HEALTH INSURANCE | Admitting: Pulmonary Disease

## 2010-08-23 DIAGNOSIS — G4733 Obstructive sleep apnea (adult) (pediatric): Secondary | ICD-10-CM

## 2010-09-03 ENCOUNTER — Telehealth: Payer: Self-pay | Admitting: Internal Medicine

## 2010-09-06 ENCOUNTER — Telehealth (INDEPENDENT_AMBULATORY_CARE_PROVIDER_SITE_OTHER): Payer: Self-pay | Admitting: *Deleted

## 2010-09-10 NOTE — Progress Notes (Signed)
Summary: Rf Hydroco/APAP  Phone Note Refill Request Message from:  Fax from Pharmacy  Refills Requested: Medication #1:  HYDROCODONE-ACETAMINOPHEN 5-500 MG TABS 1-2 by mouth two times a day as needed pain   Dosage confirmed as above?Dosage Confirmed   Supply Requested: 60   Last Refilled: 07/18/2010  Method Requested: Telephone to Pharmacy Next Appointment Scheduled: 09-23-10 Initial call taken by: Lanier Prude, [facility]),  September 03, 2010 4:20 PM  Follow-up for Phone Call        ok x 1 Follow-up by: Tresa Garter MD,  September 03, 2010 9:36 PM  Additional Follow-up for Phone Call Additional follow up Details #1::        Rx called to pharmacy Additional Follow-up by: Lanier Prude, [facility]),  September 04, 2010 4:47 PM    Prescriptions: HYDROCODONE-ACETAMINOPHEN 5-500 MG TABS (HYDROCODONE-ACETAMINOPHEN) 1-2 by mouth two times a day as needed pain  #60 x 1   Entered by:   Lanier Prude, CMA(AAMA)   Authorized by:   Tresa Garter MD   Signed by:   Lanier Prude, CMA(AAMA) on 09/04/2010   Method used:   Telephoned to ...       [facility] [id number] (retail)       [address] [place], [place]  [address]       Ph: [phone]       Fax: [phone]   RxID:   [id number]

## 2010-09-10 NOTE — Assessment & Plan Note (Signed)
Summary: rov for osa   Copy to:  Jacinta Shoe MD Primary Provider/Referring Provider:  Tresa Garter MD  CC:  Overdue for f/u appt for OSA.  Pt states she is not using her cpap machine.  Pt states she "just doesn't like it" and wants it picked up by DME company. .  History of Present Illness: pt with known severe osa.  Has not been wearing cpap, uncomfortable with mask and pressure.  Feels she cannot sleep with it.  Not willing to try different pressure delivery or desensitization  Current Medications (verified): 1)  Aspirin 81 Mg  Tbec (Aspirin) .... One By Mouth Every Day 2)  Vitamin D3 1000 Unit  Tabs (Cholecalciferol) .Marland Kitchen.. 1 By Mouth Daily 3)  Furosemide 20 Mg Tabs (Furosemide) .Marland Kitchen.. 1 By Mouth Qam As Needed For Swelling As Needed 4)  Hydrocodone-Acetaminophen 5-500 Mg Tabs (Hydrocodone-Acetaminophen) .Marland Kitchen.. 1-2 By Mouth Two Times A Day As Needed Pain 5)  Metformin Hcl 500 Mg Tabs (Metformin Hcl) .Marland Kitchen.. 1 By Mouth Qam 6)  Toprol Xl 25 Mg Xr24h-Tab (Metoprolol Succinate) .Marland Kitchen.. 1 By Mouth Qd 7)  Alprazolam 0.5 Mg Tabs (Alprazolam) .... 0.5 - 1 By Mouth Two Times A Day As Needed Anxiety  Allergies (verified): 1)  Zetia (Ezetimibe) 2)  Tramadol Hcl 3)  Wellbutrin (Bupropion Hcl) 4)  Trazodone Hcl (Trazodone Hcl)  Past History:  Past medical, surgical, family and social histories (including risk factors) reviewed, and no changes noted (except as noted below).  Past Medical History: Reviewed history from 05/15/2010 and no changes required. Osteoarthritis Skin Tag - Left cheek on face , pt anxious about excision. 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 FLP 169/146/53/87  (10/06)   Non Cardiac Chest Pain - normal Dobutamine and Myoview (last 4/06) Depression (01/02/2006) Low back pain Hypertension (07/15/2004) COPD Anxiety (07/15/2004) ESOTROPIA, LEFT EYE (ICD-378.01) Insomnia Menopause 2009 TOBACCO ABUSE (ICD-305.1) FIBROMYALGIA  (ICD-729.1) Poss periph neuropathy LE of unknown etiol 2010 Migraines Psychosomatic issues 2011 OSA 2012  Past Surgical History: Reviewed history from 11/28/2006 and no changes required. Cholecystectomy  Family History: Reviewed history from 05/14/2010 and no changes required. Family History Hypertension--brother MI--father blood clot in head--mother  Social History: Reviewed history from 06/17/2010 and no changes required. 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  The patient denies shortness of breath at rest, productive cough, non-productive cough, coughing up blood, chest pain, irregular heartbeats, indigestion, loss of appetite, weight change, abdominal pain, difficulty swallowing, sore throat, tooth/dental problems, headaches, nasal congestion/difficulty breathing through nose, sneezing, itching, ear ache, anxiety, depression, hand/feet swelling, rash, change in color of mucus, and fever.    Vital Signs:  Patient profile:   65 year old female Height:      62 inches Weight:      211 pounds BMI:     38.73 BP sitting:   140 / 72  (left arm) Cuff size:   large  Vitals Entered By: Arman Filter LPN (August 23, 2010 9:35 AM)  O2 Flow:  Room air CC: Overdue for f/u appt for OSA.  Pt states she is not using her cpap machine.  Pt states she "just doesn't like it" and wants it picked up by DME company.  Comments Medications reviewed with patient Arman Filter LPN  August 23, 2010 9:35 AM    Physical  Exam  General:  obese female in nad Nose:  no skin breakdown or pressure necrosis from cpap mask Extremities:  mild edema, no cyanosis  Neurologic:  awake, but appears mildly sleepy moves all 4.   Impression & Recommendations:  Problem # 1:  OBSTRUCTIVE SLEEP APNEA (ICD-327.23) the pt has been  intolerant of cpap, and is not willing to try further.  I have recommended nocturnal oxygen to help maintain sats, but she does not think she can wear that either.  I have explained to her that she has severe osa, and this poses a big health risk for her.  Unfortunately, her degree of sleep apnea will not be treated adequately by surgery or dental appliance.  I have encouraged her to work on weight loss.  I have asked her to call me if she changes her mind and would like to try a positive pressure device again.  Would probably start with auto bipap at that time.  Other Orders: Est. Patient Level III (24401) DME Referral (DME)  Patient Instructions: 1)  will discontinue cpap, but please call if you wish to try again. 2)  work on weight loss 3)  followup with me as needed.

## 2010-09-17 ENCOUNTER — Ambulatory Visit: Payer: Self-pay | Admitting: Internal Medicine

## 2010-09-19 NOTE — Progress Notes (Signed)
Summary: Rf Naproxen  Phone Note From Pharmacy   Summary of Call: rec Rf req for NAPROXEN 500MG  TAB 1 by mouth two times a day. # 60 Last Rf 02/2010. Med is not active. ok to Rf??? Initial call taken by: Lanier Prude, [facility]),  September 06, 2010 5:07 PM  Follow-up for Phone Call        ok x3 Follow-up by: Tresa Garter MD,  September 06, 2010 6:05 PM    New/Updated Medications: NAPROSYN 500 MG TABS (NAPROXEN) 1 to 2 by mouth as needed daily Prescriptions: NAPROSYN 500 MG TABS (NAPROXEN) 1 to 2 by mouth as needed daily  #60 x 3   Entered by:   Ami Bullins CMA   Authorized by:   Tresa Garter MD   Signed by:   Bill Salinas CMA on 09/09/2010   Method used:   Electronically to        [facility] [id number] (retail)       [address] [place], [place]  [address]       Ph: [phone]       Fax: [phone]   RxID:   [id number]

## 2010-09-23 ENCOUNTER — Other Ambulatory Visit: Payer: Self-pay | Admitting: Internal Medicine

## 2010-09-23 ENCOUNTER — Encounter: Payer: Self-pay | Admitting: Internal Medicine

## 2010-09-23 ENCOUNTER — Ambulatory Visit (INDEPENDENT_AMBULATORY_CARE_PROVIDER_SITE_OTHER): Payer: PRIVATE HEALTH INSURANCE | Admitting: Internal Medicine

## 2010-09-23 ENCOUNTER — Other Ambulatory Visit: Payer: PRIVATE HEALTH INSURANCE

## 2010-09-23 ENCOUNTER — Ambulatory Visit: Payer: Self-pay | Admitting: Internal Medicine

## 2010-09-23 DIAGNOSIS — R5383 Other fatigue: Secondary | ICD-10-CM

## 2010-09-23 DIAGNOSIS — E669 Obesity, unspecified: Secondary | ICD-10-CM

## 2010-09-23 DIAGNOSIS — G4700 Insomnia, unspecified: Secondary | ICD-10-CM

## 2010-09-23 DIAGNOSIS — R7309 Other abnormal glucose: Secondary | ICD-10-CM

## 2010-09-23 DIAGNOSIS — R5381 Other malaise: Secondary | ICD-10-CM

## 2010-09-23 DIAGNOSIS — M2550 Pain in unspecified joint: Secondary | ICD-10-CM

## 2010-09-23 DIAGNOSIS — F450 Somatization disorder: Secondary | ICD-10-CM

## 2010-09-23 LAB — BASIC METABOLIC PANEL
BUN: 13 mg/dL (ref 6–23)
CO2: 27 mEq/L (ref 19–32)
Calcium: 9.3 mg/dL (ref 8.4–10.5)
Chloride: 104 mEq/L (ref 96–112)
Creatinine, Ser: 0.7 mg/dL (ref 0.4–1.2)
GFR: 84.41 mL/min (ref >60.00)
Glucose, Bld: 117 mg/dL — ABNORMAL HIGH (ref 70–99)
Potassium: 4.6 mEq/L (ref 3.5–5.1)
Sodium: 138 mEq/L (ref 135–145)

## 2010-09-23 LAB — CBC WITH DIFFERENTIAL/PLATELET
Basophils Absolute: 0.1 10*3/uL (ref 0.0–0.1)
Basophils Relative: 0.9 % (ref 0.0–3.0)
Eosinophils Absolute: 0.1 10*3/uL (ref 0.0–0.7)
Eosinophils Relative: 1.3 % (ref 0.0–5.0)
HCT: 40.2 % (ref 36.0–46.0)
Hemoglobin: 13.8 g/dL (ref 12.0–15.0)
Lymphocytes Relative: 33.8 % (ref 12.0–46.0)
Lymphs Abs: 2 10*3/uL (ref 0.7–4.0)
MCHC: 34.3 g/dL (ref 30.0–36.0)
MCV: 94.2 fl (ref 78.0–100.0)
Monocytes Absolute: 0.3 10*3/uL (ref 0.1–1.0)
Monocytes Relative: 4.8 % (ref 3.0–12.0)
Neutro Abs: 3.6 10*3/uL (ref 1.4–7.7)
Neutrophils Relative %: 59.2 % (ref 43.0–77.0)
Platelets: 198 10*3/uL (ref 150.0–400.0)
RBC: 4.26 Mil/uL (ref 3.87–5.11)
RDW: 13.2 % (ref 11.5–14.6)
WBC: 6 10*3/uL (ref 4.5–10.5)

## 2010-09-23 LAB — HEMOGLOBIN A1C: Hgb A1c MFr Bld: 5.7 % (ref 4.6–6.5)

## 2010-09-23 LAB — SEDIMENTATION RATE: Sed Rate: 8 mm/hr (ref 0–22)

## 2010-09-26 LAB — COMPREHENSIVE METABOLIC PANEL
ALT: 21 U/L (ref 0–35)
AST: 27 U/L (ref 0–37)
Albumin: 3.7 g/dL (ref 3.5–5.2)
Alkaline Phosphatase: 79 U/L (ref 39–117)
BUN: 19 mg/dL (ref 6–23)
CO2: 23 mEq/L (ref 19–32)
Calcium: 9 mg/dL (ref 8.4–10.5)
Chloride: 108 mEq/L (ref 96–112)
Creatinine, Ser: 0.77 mg/dL (ref 0.4–1.2)
GFR calc Af Amer: >60 mL/min (ref >60)
GFR calc non Af Amer: >60 mL/min (ref >60)
Glucose, Bld: 107 mg/dL — ABNORMAL HIGH (ref 70–99)
Potassium: 3.9 mEq/L (ref 3.5–5.1)
Sodium: 139 mEq/L (ref 135–145)
Total Bilirubin: 0.4 mg/dL (ref 0.3–1.2)
Total Protein: 6.3 g/dL (ref 6.0–8.3)

## 2010-09-26 LAB — CBC
HCT: 40.1 % (ref 36.0–46.0)
Hemoglobin: 13.5 g/dL (ref 12.0–15.0)
MCH: 31.6 pg (ref 26.0–34.0)
MCHC: 33.7 g/dL (ref 30.0–36.0)
MCV: 93.9 fL (ref 78.0–100.0)
Platelets: 217 10*3/uL (ref 150–400)
RBC: 4.27 MIL/uL (ref 3.87–5.11)
RDW: 12.9 % (ref 11.5–15.5)
WBC: 9.9 10*3/uL (ref 4.0–10.5)

## 2010-09-26 LAB — POCT CARDIAC MARKERS
CKMB, poc: <1 ng/mL — ABNORMAL LOW (ref 1.0–8.0)
Myoglobin, poc: 46.8 ng/mL (ref 12–200)
Troponin i, poc: <0.05 ng/mL (ref 0.00–0.09)

## 2010-09-26 LAB — DIFFERENTIAL
Basophils Absolute: 0 10*3/uL (ref 0.0–0.1)
Basophils Relative: 0 % (ref 0–1)
Eosinophils Absolute: 0.1 10*3/uL (ref 0.0–0.7)
Eosinophils Relative: 1 % (ref 0–5)
Lymphocytes Relative: 19 % (ref 12–46)
Lymphs Abs: 1.8 10*3/uL (ref 0.7–4.0)
Monocytes Absolute: 0.8 10*3/uL (ref 0.1–1.0)
Monocytes Relative: 9 % (ref 3–12)
Neutro Abs: 7.1 10*3/uL (ref 1.7–7.7)
Neutrophils Relative %: 72 % (ref 43–77)

## 2010-10-01 NOTE — Assessment & Plan Note (Signed)
Summary: 3 MO FU  Amanda Arnold   Vital Signs:  Patient profile:   65 year old female Height:      62 inches (157.48 cm) Weight:      206.75 pounds (93.98 kg) BMI:     37.95 O2 Sat:      95 % on Room air Temp:     98.3 degrees F (36.83 degrees C) oral Pulse rate:   75 / minute Resp:     20 per minute BP sitting:   128 / 74  (left arm) Cuff size:   large  Vitals Entered By: Burnard Leigh CMA(AAMA) (September 23, 2010 10:12 AM)  O2 Flow:  Room air CC: 3-mth F/U.Pt c/o pain and weakness in Left arm & Sharp intermittent chest pains x2 weeks/sls,cma Comments Pt states they are unsure if they p/u Rx for Naprosyn 500mg .?   Primary Care Provider:  Tresa Garter MD  CC:  3-mth F/U.Pt c/o pain and weakness in Left arm & Sharp intermittent chest pains x2 weeks/sls and cma.  History of Present Illness: The patient presents for a follow up of hypertension, diabetes, hyperlipidemia and OA C/o L shoulder pain C/o insomnia from pain meds C/o fatigue Not using CPAP  Current Medications (verified): 1)  Aspirin 81 Mg  Tbec (Aspirin) .... One By Mouth Every Day 2)  Vitamin D3 1000 Unit  Tabs (Cholecalciferol) .Marland Kitchen.. 1 By Mouth Daily 3)  Furosemide 20 Mg Tabs (Furosemide) .Marland Kitchen.. 1 By Mouth Qam As Needed For Swelling As Needed 4)  Hydrocodone-Acetaminophen 5-500 Mg Tabs (Hydrocodone-Acetaminophen) .Marland Kitchen.. 1-2 By Mouth Two Times A Day As Needed Pain 5)  Metformin Hcl 500 Mg Tabs (Metformin Hcl) .Marland Kitchen.. 1 By Mouth Qam 6)  Toprol Xl 25 Mg Xr24h-Tab (Metoprolol Succinate) .Marland Kitchen.. 1 By Mouth Qd 7)  Alprazolam 0.5 Mg Tabs (Alprazolam) .... 0.5 - 1 By Mouth Two Times A Day As Needed Anxiety 8)  Naprosyn 500 Mg Tabs (Naproxen) .Marland Kitchen.. 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 FLP 169/146/53/87  (10/06)   Non Cardiac Chest Pain - normal Dobutamine and Myoview (last 4/06) Depression (01/02/2006) Low back pain Hypertension (07/15/2004) COPD Anxiety (07/15/2004) ESOTROPIA, LEFT EYE (ICD-378.01) Insomnia Menopause 2009 TOBACCO ABUSE (ICD-305.1) FIBROMYALGIA (ICD-729.1) Poss periph neuropathy LE of unknown etiol 2010 Migraines Psychosomatic issues 2011 OSA 2012 Depression  Review of Systems       The patient complains of dyspnea on exertion, difficulty walking, and depression.  The patient denies anorexia, fever, weight loss, weight gain, vision loss, and chest pain.         Tired, not sleeping well  Physical Exam  General:  NAD overweight-appearing.   Eyes:  No corneal or conjunctival inflammation noted. EOMI. Perrla. Nose:  no airflow obstruction, no intranasal foreign body, no nasal polyps, no nasal mucosal lesions, no mucosal friability, no active bleeding or clots, no sinus percussion tenderness, nasal dischargemucosal pallor, and mucosal edema.   Mouth:  WNL Neck:  No deformities, masses, or tenderness noted. Lungs:  Normal respiratory effort, chest expands symmetrically. Lungs are clear to auscultation, no crackles or wheezes. Heart:  Normal rate and regular rhythm. S1 and S2 normal without gallop, murmur, click, rub or other extra sounds. Abdomen:  soft, some -tender in epig area, normal bowel sounds, no distention, no masses, no guarding, and no rigidity.   Msk:  R hip pain on palpation laterally R>>L R anterolat thigh w/oval area of  numbness Extremities:  No edema B Neurologic:  alert & oriented X3 and cranial nerves II-XII symetrically intact.  strength normal in all extremities, sensation intact to light touch, and gait normal. speech fluent without dysarthria or aphasia; follows commands with good comprehension.  Skin:  turgor normal, color normal, no rashes, no suspicious lesions, no ecchymoses, and no petechiae.   Cervical Nodes:  No lymphadenopathy noted Psych:  Cognition and judgment appear intact. Alert and cooperative with normal attention span and concentration. No apparent delusions, illusions, hallucinations, depressed   Impression & Recommendations:  Problem # 1:  OBSTRUCTIVE SLEEP APNEA (ICD-327.23) Assessment Unchanged She refused CPAP Risks of MI, CVA etc were discussed.   Problem # 2:  HYPERGLYCEMIA (ICD-790.29) Assessment: Comment Only  Her updated medication list for this problem includes:    Metformin Hcl 500 Mg Tabs (Metformin hcl) .Marland Kitchen... 1 by mouth qam  Orders: TLB-BMP (Basic Metabolic Panel-BMET) (80048-METABOL) TLB-CBC Platelet - w/Differential (85025-CBCD) TLB-A1C / Hgb A1C (Glycohemoglobin) (83036-A1C) TLB-Sedimentation Rate (ESR) (85652-ESR)  Problem # 3:  FATIGUE (ICD-780.79) Assessment: Unchanged  Orders: TLB-BMP (Basic Metabolic Panel-BMET) (80048-METABOL) TLB-CBC Platelet - w/Differential (85025-CBCD) TLB-A1C / Hgb A1C (Glycohemoglobin) (83036-A1C) TLB-Sedimentation Rate (ESR) (85652-ESR)  Problem # 4:  TACHYCARDIA (ICD-785.0) Assessment: Improved  Problem # 5:  INSOMNIA, PERSISTENT (ICD-307.42) Assessment: Unchanged  Start Citalopram  Orders: TLB-BMP (Basic Metabolic Panel-BMET) (80048-METABOL) TLB-CBC Platelet - w/Differential (85025-CBCD) TLB-A1C / Hgb A1C (Glycohemoglobin) (83036-A1C) TLB-Sedimentation Rate (ESR) (85652-ESR)  Problem # 6:  DEPRESSION (ICD-311) Assessment: Deteriorated  Her updated medication list for this problem includes:    Alprazolam 0.5  Mg Tabs (Alprazolam) .Marland Kitchen... 0.5 - 1 by mouth two times a day as needed anxiety    Citalopram Hydrobromide 10 Mg Tabs (Citalopram hydrobromide) .Marland Kitchen... 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 Treat depression The office visit took longer than 45 min with patient councelling for more than 50% of the 45 min   Complete Medication List: 1)  Furosemide 20 Mg Tabs (Furosemide) .Marland Kitchen.. 1 by mouth qam as needed for swelling as needed 2)  Hydrocodone-acetaminophen 5-500 Mg Tabs (Hydrocodone-acetaminophen) .Marland Kitchen.. 1-2 by mouth two times a day as needed pain 3)  Metformin Hcl 500 Mg Tabs (Metformin hcl) .Marland Kitchen.. 1 by mouth qam 4)  Toprol Xl 25 Mg Xr24h-tab (Metoprolol succinate) .Marland Kitchen.. 1 by mouth qd 5)  Alprazolam 0.5 Mg Tabs (Alprazolam) .... 0.5 - 1 by mouth two times a day as needed anxiety 6)  Naprosyn 500 Mg Tabs (Naproxen) .Marland Kitchen.. 1 to 2 by mouth as needed daily 7)  Citalopram Hydrobromide 10 Mg Tabs (Citalopram hydrobromide) .Marland Kitchen.. 1 by mouth once daily for depression 8)  Aspirin 81 Mg Tbec (Aspirin) .... One by mouth every day 9)  Vitamin D3 1000 Unit Tabs (Cholecalciferol) .Marland Kitchen.. 1 by mouth daily  Patient Instructions: 1)  Please schedule a follow-up appointment in 2 months. 2)  Use stretching and balance exercises that I have provided (15 min. or longer every day) Prescriptions:  CITALOPRAM HYDROBROMIDE 10 MG TABS (CITALOPRAM HYDROBROMIDE) 1 by mouth once daily for depression  #30 x 6   Entered and Authorized by:   Tresa Garter MD   Signed by:   Tresa Garter MD on 09/23/2010   Method used:   Electronically to        [facility] [id number] (retail)       [address] [place], [place]  [address]       Ph: [phone]       Fax: [phone]   RxID:   [id number]    Orders Added: 1)  TLB-BMP (Basic Metabolic Panel-BMET) [80048-METABOL] 2)  TLB-CBC Platelet -  w/Differential [85025-CBCD] 3)  TLB-A1C / Hgb A1C (Glycohemoglobin) [83036-A1C] 4)  TLB-Sedimentation Rate (ESR) [85652-ESR] 5)  Est. Patient Level V [06301]

## 2010-10-16 LAB — URINALYSIS, ROUTINE W REFLEX MICROSCOPIC
Bilirubin Urine: NEGATIVE
Glucose, UA: NEGATIVE mg/dL
Hgb urine dipstick: NEGATIVE
Ketones, ur: NEGATIVE mg/dL
Nitrite: NEGATIVE
Protein, ur: NEGATIVE mg/dL
Specific Gravity, Urine: 1.027 (ref 1.005–1.030)
Urobilinogen, UA: 0.2 mg/dL (ref 0.0–1.0)
pH: 6 (ref 5.0–8.0)

## 2010-10-16 LAB — URINE MICROSCOPIC-ADD ON

## 2010-10-17 LAB — POCT I-STAT, CHEM 8
BUN: 16 mg/dL (ref 6–23)
Calcium, Ion: 1.21 mmol/L (ref 1.12–1.32)
Chloride: 104 mEq/L (ref 96–112)
Creatinine, Ser: 0.5 mg/dL (ref 0.4–1.2)
Glucose, Bld: 105 mg/dL — ABNORMAL HIGH (ref 70–99)
HCT: 42 % (ref 36.0–46.0)
Hemoglobin: 14.3 g/dL (ref 12.0–15.0)
Potassium: 3.8 mEq/L (ref 3.5–5.1)
Sodium: 140 mEq/L (ref 135–145)
TCO2: 23 mmol/L (ref 0–100)

## 2010-10-17 LAB — DIFFERENTIAL
Basophils Absolute: 0 10*3/uL (ref 0.0–0.1)
Basophils Relative: 1 % (ref 0–1)
Eosinophils Absolute: 0.1 10*3/uL (ref 0.0–0.7)
Eosinophils Relative: 2 % (ref 0–5)
Lymphocytes Relative: 40 % (ref 12–46)
Lymphs Abs: 2.6 10*3/uL (ref 0.7–4.0)
Monocytes Absolute: 0.5 10*3/uL (ref 0.1–1.0)
Monocytes Relative: 8 % (ref 3–12)
Neutro Abs: 3.3 10*3/uL (ref 1.7–7.7)
Neutrophils Relative %: 50 % (ref 43–77)

## 2010-10-17 LAB — CBC
HCT: 39.3 % (ref 36.0–46.0)
Hemoglobin: 13.7 g/dL (ref 12.0–15.0)
MCHC: 34.8 g/dL (ref 30.0–36.0)
MCV: 93.2 fL (ref 78.0–100.0)
Platelets: 235 10*3/uL (ref 150–400)
RBC: 4.21 MIL/uL (ref 3.87–5.11)
RDW: 12.8 % (ref 11.5–15.5)
WBC: 6.6 10*3/uL (ref 4.0–10.5)

## 2010-10-17 LAB — SEDIMENTATION RATE: Sed Rate: 1 mm/hr (ref 0–22)

## 2010-10-21 LAB — URINALYSIS, ROUTINE W REFLEX MICROSCOPIC
Glucose, UA: NEGATIVE mg/dL
Hgb urine dipstick: NEGATIVE
Ketones, ur: NEGATIVE mg/dL
Nitrite: NEGATIVE
Protein, ur: NEGATIVE mg/dL
Specific Gravity, Urine: 1.028 (ref 1.005–1.030)
Urobilinogen, UA: 0.2 mg/dL (ref 0.0–1.0)
pH: 5.5 (ref 5.0–8.0)

## 2010-10-21 LAB — COMPREHENSIVE METABOLIC PANEL WITH GFR
ALT: 34 U/L (ref 0–35)
AST: 32 U/L (ref 0–37)
Albumin: 4 g/dL (ref 3.5–5.2)
Alkaline Phosphatase: 85 U/L (ref 39–117)
BUN: 21 mg/dL (ref 6–23)
CO2: 27 meq/L (ref 19–32)
Calcium: 9.3 mg/dL (ref 8.4–10.5)
Chloride: 104 meq/L (ref 96–112)
Creatinine, Ser: 0.69 mg/dL (ref 0.4–1.2)
GFR calc non Af Amer: >60 mL/min
Glucose, Bld: 100 mg/dL — ABNORMAL HIGH (ref 70–99)
Potassium: 3.3 meq/L — ABNORMAL LOW (ref 3.5–5.1)
Sodium: 141 meq/L (ref 135–145)
Total Bilirubin: 0.8 mg/dL (ref 0.3–1.2)
Total Protein: 6.9 g/dL (ref 6.0–8.3)

## 2010-10-21 LAB — CBC
HCT: 41.8 % (ref 36.0–46.0)
Hemoglobin: 14.6 g/dL (ref 12.0–15.0)
MCHC: 34.9 g/dL (ref 30.0–36.0)
MCV: 93 fL (ref 78.0–100.0)
Platelets: 214 10*3/uL (ref 150–400)
RBC: 4.5 MIL/uL (ref 3.87–5.11)
RDW: 12.4 % (ref 11.5–15.5)
WBC: 6.7 10*3/uL (ref 4.0–10.5)

## 2010-10-21 LAB — DIFFERENTIAL
Basophils Absolute: 0 K/uL (ref 0.0–0.1)
Basophils Relative: 1 % (ref 0–1)
Eosinophils Absolute: 0.1 K/uL (ref 0.0–0.7)
Eosinophils Relative: 1 % (ref 0–5)
Lymphocytes Relative: 39 % (ref 12–46)
Lymphs Abs: 2.7 K/uL (ref 0.7–4.0)
Monocytes Absolute: 0.4 K/uL (ref 0.1–1.0)
Monocytes Relative: 6 % (ref 3–12)
Neutro Abs: 3.6 K/uL (ref 1.7–7.7)
Neutrophils Relative %: 53 % (ref 43–77)

## 2010-10-21 LAB — LIPASE, BLOOD: Lipase: 31 U/L (ref 11–59)

## 2010-11-07 ENCOUNTER — Telehealth: Payer: Self-pay | Admitting: *Deleted

## 2010-11-07 NOTE — Telephone Encounter (Signed)
rec rf req for Hydro/APAP 5/500 1-2 po bid prn pain... # 60. Last filled 10-07-10... Ok to rf?

## 2010-11-07 NOTE — Telephone Encounter (Signed)
OK to fill this prescription with additional refills x0 Thank you!  

## 2010-11-08 MED ORDER — HYDROCODONE-ACETAMINOPHEN 5-500 MG PO TABS: 1.0000 | ORAL_TABLET | Freq: Two times a day (BID) | ORAL | Status: DC | PRN

## 2010-11-15 ENCOUNTER — Other Ambulatory Visit: Payer: Self-pay | Admitting: Internal Medicine

## 2010-11-15 NOTE — Telephone Encounter (Signed)
Ok to Rf in AVP's absence?

## 2010-11-15 NOTE — Telephone Encounter (Signed)
Rx faxed to pharmacy  

## 2010-11-15 NOTE — Telephone Encounter (Signed)
i printed 

## 2010-11-16 ENCOUNTER — Emergency Department (HOSPITAL_COMMUNITY): Payer: PRIVATE HEALTH INSURANCE

## 2010-11-16 ENCOUNTER — Emergency Department (HOSPITAL_COMMUNITY)
Admission: EM | Admit: 2010-11-16 | Discharge: 2010-11-17 | Disposition: A | Discharge status: Home / Self Care | Payer: PRIVATE HEALTH INSURANCE | Attending: Emergency Medicine | Admitting: Emergency Medicine

## 2010-11-16 DIAGNOSIS — M545 Low back pain, unspecified: Secondary | ICD-10-CM | POA: Insufficient documentation

## 2010-11-16 DIAGNOSIS — I10 Essential (primary) hypertension: Secondary | ICD-10-CM | POA: Insufficient documentation

## 2010-11-16 DIAGNOSIS — G8929 Other chronic pain: Secondary | ICD-10-CM | POA: Insufficient documentation

## 2010-11-16 DIAGNOSIS — M1990 Unspecified osteoarthritis, unspecified site: Secondary | ICD-10-CM | POA: Insufficient documentation

## 2010-11-16 DIAGNOSIS — J4489 Other specified chronic obstructive pulmonary disease: Secondary | ICD-10-CM | POA: Insufficient documentation

## 2010-11-16 DIAGNOSIS — M25559 Pain in unspecified hip: Secondary | ICD-10-CM | POA: Insufficient documentation

## 2010-11-16 DIAGNOSIS — J449 Chronic obstructive pulmonary disease, unspecified: Secondary | ICD-10-CM | POA: Insufficient documentation

## 2010-11-16 LAB — URINALYSIS, ROUTINE W REFLEX MICROSCOPIC
Bilirubin Urine: NEGATIVE
Glucose, UA: NEGATIVE mg/dL
Hgb urine dipstick: NEGATIVE
Ketones, ur: NEGATIVE mg/dL
Nitrite: NEGATIVE
Protein, ur: NEGATIVE mg/dL
Specific Gravity, Urine: 1.041 — ABNORMAL HIGH (ref 1.005–1.030)
Urobilinogen, UA: 0.2 mg/dL (ref 0.0–1.0)
pH: 5 (ref 5.0–8.0)

## 2010-11-16 LAB — GLUCOSE, CAPILLARY: Glucose-Capillary: 173 mg/dL — ABNORMAL HIGH (ref 70–99)

## 2010-11-20 ENCOUNTER — Encounter: Payer: PRIVATE HEALTH INSURANCE | Admitting: Internal Medicine

## 2010-11-20 DIAGNOSIS — Z0289 Encounter for other administrative examinations: Secondary | ICD-10-CM

## 2010-11-21 ENCOUNTER — Ambulatory Visit (INDEPENDENT_AMBULATORY_CARE_PROVIDER_SITE_OTHER): Payer: PRIVATE HEALTH INSURANCE | Admitting: Endocrinology

## 2010-11-21 ENCOUNTER — Encounter: Payer: Self-pay | Admitting: Endocrinology

## 2010-11-21 VITALS — BP 124/82 | HR 71 | Temp 98.2°F | Ht 62.0 in | Wt 206.8 lb

## 2010-11-21 DIAGNOSIS — R7309 Other abnormal glucose: Secondary | ICD-10-CM

## 2010-11-21 DIAGNOSIS — R51 Headache: Secondary | ICD-10-CM

## 2010-11-21 MED ORDER — GLUCOSE BLOOD VI STRP: ORAL_STRIP | Status: DC

## 2010-11-21 MED ORDER — ONDANSETRON 4 MG PO TBDP: 4.0000 mg | ORAL_TABLET | Freq: Three times a day (TID) | ORAL | Status: AC | PRN

## 2010-11-21 NOTE — Patient Instructions (Addendum)
Here is a meter to check your blood sugar.  i've sent a prescription for strips to your pharmacy. check your blood sugar 1 time a day.  vary the time of day when you check, between before the 3 meals, and at bedtime.  also check if you have symptoms of your blood sugar being too high or too low.  please keep a record of the readings and bring it to your next appointment here.  please call us sooner if you are having low blood sugar episodes. i have also sent to your pharmacy a prescription for nausea, to help you take the pain medication. Refer to a headache specialist.  you will be called with a day and time for an appointment.

## 2010-11-21 NOTE — Progress Notes (Signed)
  Subjective:    Patient ID: Amanda Arnold, female    DOB: 1946/03/27, 65 y.o.   MRN: [id number]  HPI Pt states 1 month of severe pain at the bifrontal areas, rad to the bilat mandibular areas.  She has assoc blurry vision.  She gets only temporary relief from vicodin, and this causes nausea.  Pt takes metformin as rx'ed, and wants to check her cbg's. Past Medical History  Diagnosis Date  . HYPERLIPIDEMIA 04/30/2006  . ANXIETY 05/25/2006  . 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 INSOMNIA, PERSISTENT 02/22/2008  . DEPRESSION 01/02/2006  . OBSTRUCTIVE SLEEP APNEA 04/17/2010  . 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 ESOTROPIA, LEFT EYE 1945-08-29  . HYPERTENSION 05/25/2006  . BRONCHITIS, ACUTE 12/08/2007  . COPD 05/25/2006  . T M J 05/11/2009  . OSTEOARTHRITIS 05/25/2006  . LOW BACK PAIN 01/27/2007  . FIBROMYALGIA 04/02/2006  . Headache 12/08/2007  . HYPERGLYCEMIA 05/15/2010  . Abdominal pain, epigastric 12/18/2009    Past Surgical History  Procedure Date  . Cholecystectomy     History   Social History  . Marital Status: Married    Spouse Name: N/A    Number of Children: 5  . Years of Education: N/A   Occupational History  . house wife    Social History Main Topics  . Smoking status: Former Smoker    Quit date: 04/13/2010  . Smokeless tobacco: Not on file  . Alcohol Use: No  . Drug Use: Not on file  . Sexually Active: Not on file   Other Topics Concern  . Not on file   Social History Narrative  . No narrative on file    Current Outpatient Prescriptions on File Prior to Visit  Medication Sig Dispense Refill  . ALPRAZolam (XANAX) 0.5 MG tablet Take 1/2 to 1 tablet(s) by mouth twicedaily as needed for anxiety  60 tablet  0  . HYDROcodone-acetaminophen (VICODIN) 5-500 MG per tablet Take 1-2 tablets by mouth 2 (two) times daily as needed for pain.  60 tablet  0    Allergies  Allergen Reactions  . Bupropion Hcl     REACTION: not right  . Duloxetine     REACTION: did not  feel good  . Ezetimibe     REACTION: legs burning  . Tramadol Hcl     REACTION: palpitations  . Trazodone Hcl     REACTION: ?? swelling    Family History  Problem Relation Age of Onset  . Heart attack Father   . Hypertension Brother     BP 124/82  Pulse 71  Temp(Src) 98.2 F (36.8 C) (Oral)  Ht 5\' 2"  (1.575 m)  Wt 206 lb 12.8 oz (93.804 kg)  BMI 37.82 kg/m2  SpO2 95%    Review of Systems Denies loc and fever.      Objective:   Physical Exam GENERAL: no distress.  Obese. head: no deformity eyes: no periorbital swelling, no proptosis. Optic fundi are normal external nose and ears are normal mouth: no lesion seen Ears:  eac's and tm' are normal.    Lab Results  Component Value Date   HGBA1C 5.7 09/23/2010     Assessment & Plan:  Facial pain, uncertain etiology Hyperglycemia, on metformin rx

## 2010-11-28 ENCOUNTER — Ambulatory Visit: Payer: PRIVATE HEALTH INSURANCE | Admitting: Internal Medicine

## 2010-11-29 NOTE — H&P (Signed)
Amanda Arnold, Amanda Arnold               ACCOUNT NO.:  [id number]   MEDICAL RECORD NO.:  [id number]          PATIENT TYPE:  INP   LOCATION:                               FACILITY:  [facility]   PHYSICIAN:  Georgina Quint. Plotnikov, M.D. LHCDATE OF BIRTH:  [birth year]   DATE OF ADMISSION:  10/18/2004  DATE OF DISCHARGE:                                HISTORY & PHYSICAL   CHIEF COMPLAINT:  Chest pain, left arm pain.   HISTORY:  The patient is a 65 year old female who presents with 17-month  history of periodic chest pain in the middle and periodic pain in the left  shoulder down the left arm.  Usually nonexertional.  The patient may be  severe and last for several minutes. She went to urgent care once and was  checked in the emergency room. She has symptoms today as well.  She  __________ quite frequently.  No syncope.  No neurologic complaints.   PAST MEDICAL HISTORY:  1.  Low back pain, chronic.  2.  Osteoarthritis.  3.  Hypertension.  4.  Asthma/chronic obstructive pulmonary disease.   FAMILY HISTORY:  Mother had coronary artery disease in her 67s.   SOCIAL HISTORY:  She has 5 children, married, ex-smoker, quit in September  2005.   CURRENT MEDICINES:  1.  Azmacort.  2.  Atenolol 50 mg.  3.  Oxycodone 5/325 q.i.d. p.r.n.  4.  Albuterol.   REVIEW OF SYSTEMS:  Chest pain, as above.  No abdominal pain, no nausea or  vomiting.  Short of breath lately.  Denies purulent discharge or mucus.  The  rest is negative.   PHYSICAL EXAMINATION:  VITAL SIGNS:  Blood pressure 158/89, pulse 75,  temperature 97.4, weight 202 pounds.  She is in no acute distress.  Slightly  dyspneic with exertion.  HEENT:  With moist mucosa.  NECK:  Supple.  No thyromegaly.  LUNGS:  Clear, somewhat prolonged expiratory phase.  HEART:  S1-S2 slight tachycardia.  ABDOMEN:  Soft and nontender.  Lower extremities without edema.  EXTREMITIES:  Calves nontender.  NEUROLOGIC:  She is alert, oriented and cooperative.   LABS:   Chest x-ray in December 2005 normal.  EKG, today, with normal sinus  rhythm.  Marked ST changes in lateral leads more prominent than previously.   ASSESSMENT/PLAN:  1.  Atypical chest pain/left arm pain.  We will admit to telemetry.  Rule      out myocardial infarction protocol.  Cardiology consult.  2.  Dyspnea/chronic obstructive pulmonary disease exacerbation.  We will use      Solu-Medrol IV and restart Azmacort, albuterol handheld nebulizer.  3.  Arthritis.  Continue oxycodone/APAP p.r.n.  4.  Hypertension suboptimal control.  Add Maxzide.  5.  __________ related to glucose will check hemoglobin A1c.       ___________________________________________  Georgina Quint. Plotnikov, M.D. [facility]    AVP/MEDQ  D:  10/18/2004  T:  10/18/2004  Job:  [id number]

## 2010-11-29 NOTE — Cardiovascular Report (Signed)
. [facility]  Patient:    Amanda Arnold, Amanda Arnold                      MRN: [id number] Proc. Date: 01/30/00 Adm. Date:  [id number] Attending:  Loreli Dollar CC:         Cardiac Catheterization Laboratory             Ronnald Nian, M.D.                        Cardiac Catheterization  PROCEDURE: 1. Left heart catheterization. 2. Selective right and left coronary arteriography. 3. Ventriculography in the right anterior oblique projection.  CARDIOLOGIST:  Thereasa Solo. Little, M.D.  INDICATIONS:  [name] is a 65 year old smoker who presented to the emergency  room with chest pain that was relieved with nitroglycerin.  She left right after that and would not stay for treatment or evaluation.  She has continued to have  chest pain, and her electrocardiogram shows T-wave inversion in the anterior leads. The patient is brought in for an outpatient cardiac catheterization.  DESCRIPTION OF PROCEDURE:  The patient was prepped and draped in the usual sterile fashion, exposing the right groin.  Following local anesthetic with 1% Xylocaine, selective techniques were employed.  A 6-French introducer sheath was placed in the right femoral artery.  Selective right and left coronary arteriography and ventriculography in the ROA projection was performed.  A 6-French Judkins configuration catheter was then used.  COMPLICATIONS:  None.  RESULTS: HEMODYNAMIC MONITORING: Central aortic pressure:  126/70. Left ventricular pressure:  126/14 with no aortic valve gradient noted at the time of pullback.  VENTRICULOGRAPHY:  The ventriculography in the RAO projection revealed normal left ventricular systolic function.  The ejection fraction was 76%.  End diastolic pressure was 15.  Mitral valve prolapse was noted, but no mitral regurgitation as seen.  CORONARY ARTERIOGRAPHY: 1. Left main coronary artery:  Normal. 2. Left anterior descending coronary artery:   The LAD extended down towards    the apex but did not cross the apex.  The heart gave rise to two relatively    small diagonal branches.  There was one very large septal perforator.  This    entire system was free of disease. 3. Circumflex coronary artery:  The circumflex was small in the AV groove.    It gave rise to a medium-sized OM-I.  The second OM was a relatively    short very small diameter vessel.  This entire system was free of disease. 4. Right coronary artery:  The right coronary artery is a large dominant    vessel about 3.5 mm in diameter.  It gave rise to a large PDA, and three    very medium to moderate-sized posterolateral branches.  This entire system    was free of disease.  CONCLUSION: 1. No evidence of coronary artery disease. 2. Normal left ventricular systolic function. 3. Mitral valve prolapse without mitral regurgitation.  RECOMMENDATIONS:  At this point I cannot explain her chest pain from a cardiac standpoint.  She needs a ventilation perfusion scan for completeness of the evaluation.  Will plan to start her on H2 blockers as an outpatient, and will evaluate her in the morning, to make sure that her catheterization site is well-healed.  I have discussed this in detail with the patient and with her family. DD:  01/30/00 TD:  01/30/00 Job: [id number]

## 2010-11-29 NOTE — Discharge Summary (Signed)
NAME:  Amanda Arnold, Amanda Arnold               ACCOUNT NO.:  [id number]   MEDICAL RECORD NO.:  [id number]          PATIENT TYPE:  INP   LOCATION:  3715                         FACILITY:  [facility]   PHYSICIAN:  Bruce Rexene Edison. Swords, M.D. [facility] OF BIRTH:  1946-04-24   DATE OF ADMISSION:  10/18/2004  DATE OF DISCHARGE:  10/19/2004                                 DISCHARGE SUMMARY   DISCHARGE DIAGNOSES:  1.  Chest pain.  2.  Hypertension.  3.  Chronic obstructive pulmonary disease exacerbation.  4.  Osteoarthritis.  5.  Chronic low back pain.   DISCHARGE MEDICATIONS:  1.  Azmacort two puffs b.i.d.  2.  Atenolol 50 mg b.i.d.  3.  Oxycodone p.r.n.  4.  Albuterol two puffs q.6h. p.r.n.  5.  Maxzide 37.5/25 one p.o. q.d.  6.  Prednisone 60 mg q.d. x2, then 40 mg q.d. x2 days, then 20 mg p.o. q.d.      x2 days, then 10 mg p.o. q.d. x2 days.   FOLLOW UP:  Follow up with Dr. Posey Rea in 1-2 weeks with cardiologist at  their direction.   CONDITION ON DISCHARGE:  Improved.  Chest pain resolved.   LABORATORY DATA AND X-RAY FINDINGS:  Chest x-ray demonstrates cardiomegaly  and a small area of subsegmental atelectasis at the left base.   UA is normal.  CBC on October 19, 2004, is normal.  Cardiac panels were normal.  Hemoglobin A1c normal at 5.6.  Lipid profile with cholesterol 183, LDL 113,  HDL 47.  TSH normal at 1.258.   HOSPITAL COURSE:  Problem 1.  CHEST PAIN:  The patient was admitted to the  hospital with chest pain, left arm pain and COPD exacerbation.  See Dr.  Loren Racer note for details.  Cardiac chest pain and left arm pain  completely resolved.  She will be scheduled for a Myoview study.  There is  discussion whether that will be as an inpatient or outpatient.  That  decision will be left to cardiology.   Problem 2.  CHRONIC OBSTRUCTIVE PULMONARY DISEASE EXACERBATION:  At  discharge, her lungs are clear.  She has no shortness of breath.  She was  initially started on Solu-Medrol.  Will send  home with prednisone.   Problem 3.  HYPERTENSION:  Has been difficult to control as an outpatient.  She was started on Maxzide at the time of discharge.  Blood pressure was  113/77.      BHS/MEDQ  D:  10/19/2004  T:  10/19/2004  Job:  [id number]   cc:   Georgina Quint. Plotnikov, M.D. [facility]

## 2010-11-29 NOTE — Consult Note (Signed)
Amanda Arnold, Amanda Arnold               ACCOUNT NO.:  [id number]   MEDICAL RECORD NO.:  [id number]          PATIENT TYPE:  INP   LOCATION:  3715                         FACILITY:  [facility]   PHYSICIAN:  Pricilla Riffle, M.D.    DATE OF BIRTH:  February 02, 1946   DATE OF CONSULTATION:  DATE OF DISCHARGE:                                   CONSULTATION   PRIMARY CARE PHYSICIAN:  Dr. Jerolyn Center.   HISTORY OF PRESENT ILLNESS:  A 65 year old married white female who presents  to the ED from primary care clinic, complains of one month history of left  arm pain.  Problem list:  1.  Left arm pain/chest pain.      1.  January 30, 2000, cardiac catheterization, normal coronary arteries,          normal LV function with an EF of 70% and LVEG of 15 ml mercury.  2.  Hypertension.  3.  Remote tobacco abuse.      1.  40 pack/year history of tobacco abuse, quit September 2005.  4.  COPD/bronchitis.  5.  Chronic back pain.  6.  Status post cholecystectomy 3-4 years ago.  A 65 year old married female with history of atypical chest pain,  hypertension, remote tobacco abuse, COPD, osteoarthritis and chronic back  pain who in 2001 experienced sharp, shooting chest pain and was catheterize  revealing normal course at that time.  Over the years, she has had  intermittent bouts of intense, sharp left chest pain without associated  symptoms occurring approximately once a month, with rest and exertion,  lasting less than 1 minute and relieved spontaneously or by rubbing her  chest.  Over the past year, she has also noted a decrease in exercise  tolerance and dyspnea on exertion after walking approximately 50 yards.  She  has a one month history of 3-4/10 rest and exertional left arm nagging pain  without associated symptoms lasting 30-60 minutes and relieved by aspirin or  by rubbing her arm.  She has also tried her p.r.n. tonics at home for this  without significant relief.  She was actually seen in the ED on March  17 and  at that time ruled out for MIs and treated with nitroglycerin and morphine  with relief of left arm discomfort.  After ruling out, she was discharged to  home.  Today, she saw Dr. Juanita Laster for similar complaints of left arm pain  which she currently rates as 3-4/10 without associated symptoms, and an EKG  was performed.  This showed sinus rhythm with 0.5 - 1 ml ST segment  depression in leads V1through V6 with T-wave inversion.  As a result, she  was transferred to the ED for evaluation.  The patient continues to report  3/10 left arm discomfort without associated symptoms and appears  comfortable.   ALLERGIES:  LEVAQUIN, PAMELOR.   HOME MEDICATIONS:  1.  Atenolol 50 mg daily.  2.  Restoril 30 mg q.h.s.  3.  Oxycodone 5 mg q.4-6h p.r.n. back pain.   HOSPITAL MEDICATIONS:  1.  Atenolol 50 mg daily.  2.  Maxzide 37.5 mg daily.  3.  Aspirin 325 mg daily.  4.  Protonix 40 mg daily.  5.  Azmacort 2 puffs b.i.d.  6.  Albuterol 2.5 mg q.i.d.  7.  Solu-Medrol 8 mg IV t.i.d.  8.  Lovenox per pharmacy.   FAMILY HISTORY:  Mother died with cerebral aneurysm and hypertension at age  32.  Father died of MI and hypertension, hyperlipidemia in his 63s.  She has  5 brothers.  One died of leukemia and in the remainder there is no COPD,  hypertension, hyperlipidemia and diabetes.   REVIEW OF SYSTEMS:  Positive for cardiopulmonary, with chest pain, shortness  of breath, dyspnea on exertion and ecdemic cough.  Neuropsych:  Positive for  depression.  Musculoskeletal:  Positive for arthritis and back pain.  All  other systems reviewed and negative.   PHYSICAL EXAMINATION:  VITAL SIGNS:  Temperature 97.2, heart rate 61,  respirations 16, blood pressure 145/80, pulse oximetry 92% on room air.  GENERAL: A pleasant white female in no acute distress.  She appears  comfortable.  HEENT:  Normocephalic and atraumatic.  NECK:  Normal, no bruits or JVD.  LUNGS:  Respirations regular and  unlabored.  Clear to auscultation.  CARDIAC:  Regular S1 and S2.  No S3, S4 or murmurs.  ABDOMEN:  Obese, soft, nontender, nondistended.  Bowel sounds present.  No  hepatosplenomegaly.  EXTREMITIES:  Warm and dry.  Pink, no clubbing, cyanosis or edema.  Dorsalis  pedis and posterior tibial pulses 2+ bilaterally.  SKIN:  Warm and dry without lesions or masses.   LABORATORIES:  Her last chest x-ray performed on March 17 shows no acute  process.  Chest x-ray today is pending.  EKG from this afternoon shows a  heart rate of 75 and regular sinus rhythm with left axis deviation.  She has  less than 1 mm ST segment depressions with T-wave on leads B3 through B6 and  T-wave inversion in V1 and V2.  This is a change from EKG in November 2004  but by report this does not appear to be changed from her last EKG in September 27, 2004.  I do not have that record in front of me to actually compare.  All labs are pending currently.   ASSESSMENT AND PLAN:  1.  Left arm pain and chest pain.  The patient has now at least a 5-year      history of intermittent chest discomfort that she describes as sharp and      shooting  and this was evaluated by cardiac catheterization in July of      2001, at that time showing normal coronaries and normal RV function.      Her complaint over the past year has been dyspnea on exertion and she      has previously been treated with inhalers which she is noncompliant with      at home.  Over the past month she has complained of left arm pain that      occurs with both rest and exertion and is not worsened by exertion of      significant limit in her activities.  She states the pain right now is      3/10.  She usually takes aspirin for this at home, with complete relief      in about 60 minutes.  Her ECG continues to show sinus rhythm with intra      lateral T-wave inversion and less than 1  ml ST segment depression, and I     believe this is no change from her March 17, ECG  however I have to      obtain that record and review it.  I will cycle her cardiac enzymes to      rule out for MI and would recommend initiation of aspirin and stat beta      blockers and nitro paste.  Given the atypical nature of her symptoms she      will likely proceed with functional testing rather than catheterization.      This may be able to be done as an outpatient if she rules out.  2.  Hypertension.  Blood pressure 145/80.  It does not look like she has      taken her  medications today yet.  She remains on beta blocker and      diuretic and on a PPT.  3.  COPD and bronchitis.  She is not currently wheezing.  She is written for      some inhalers and IV Solu-Medrol by her primary care physician.  She      quit smoking in September of 2005 and we encouraged her to stay quit.  4.  Back pain.  She will receive p.r.n. narcotics.      CRB/MEDQ  D:  10/18/2004  T:  10/18/2004  Job:  [id number]

## 2010-12-12 ENCOUNTER — Telehealth: Payer: Self-pay | Admitting: *Deleted

## 2010-12-12 NOTE — Telephone Encounter (Signed)
OK to fill this prescription with additional refills x0 Thank you!  

## 2010-12-12 NOTE — Telephone Encounter (Signed)
rec rf req for Hydroco/APAP 5-500. 1-2 po bid prn. # 60. Last filled 11-08-10. Ok to Rf?

## 2010-12-16 ENCOUNTER — Other Ambulatory Visit: Payer: Self-pay | Admitting: Internal Medicine

## 2010-12-16 MED ORDER — HYDROCODONE-ACETAMINOPHEN 5-500 MG PO TABS: 1.0000 | ORAL_TABLET | Freq: Two times a day (BID) | ORAL | Status: DC | PRN

## 2011-01-03 ENCOUNTER — Ambulatory Visit (INDEPENDENT_AMBULATORY_CARE_PROVIDER_SITE_OTHER): Payer: PRIVATE HEALTH INSURANCE | Admitting: Internal Medicine

## 2011-01-03 ENCOUNTER — Encounter: Payer: Self-pay | Admitting: Internal Medicine

## 2011-01-03 DIAGNOSIS — G4700 Insomnia, unspecified: Secondary | ICD-10-CM

## 2011-01-03 DIAGNOSIS — R7309 Other abnormal glucose: Secondary | ICD-10-CM

## 2011-01-03 DIAGNOSIS — M269 Dentofacial anomaly, unspecified: Secondary | ICD-10-CM

## 2011-01-03 DIAGNOSIS — R51 Headache: Secondary | ICD-10-CM

## 2011-01-03 DIAGNOSIS — IMO0001 Reserved for inherently not codable concepts without codable children: Secondary | ICD-10-CM

## 2011-01-03 MED ORDER — HYDROXYZINE HCL 25 MG PO TABS: ORAL_TABLET | ORAL | Status: DC

## 2011-01-03 MED ORDER — CITALOPRAM HYDROBROMIDE 20 MG PO TABS: 20.0000 mg | ORAL_TABLET | Freq: Every day | ORAL | Status: DC

## 2011-01-03 NOTE — Assessment & Plan Note (Signed)
Diet and Rx discussed Restart Celexa

## 2011-01-03 NOTE — Assessment & Plan Note (Signed)
On Tizanidine

## 2011-01-03 NOTE — Assessment & Plan Note (Signed)
Will try atarax

## 2011-01-03 NOTE — Assessment & Plan Note (Signed)
Cont to check CBG at home

## 2011-01-03 NOTE — Assessment & Plan Note (Signed)
On Rx 

## 2011-01-03 NOTE — Progress Notes (Signed)
  Subjective:    Patient ID: Amanda Arnold, female    DOB: 11-Nov-1945, 65 y.o.   MRN: [id number]  HPI  C/o R HA, R earache and throat pain. She had seen Dr Pearlean Brownie and was given Tizanidine 2 mg bid - better C/o insonnia. Review of Systems  Constitutional: Positive for fatigue. Negative for chills, activity change, appetite change and unexpected weight change.  HENT: Positive for ear pain (R). Negative for congestion, mouth sores, sinus pressure and ear discharge.   Eyes: Negative for visual disturbance.  Respiratory: Negative for cough and chest tightness.   Gastrointestinal: Negative for nausea and abdominal pain.  Genitourinary: Negative for frequency, difficulty urinating and vaginal pain.  Musculoskeletal: Negative for back pain and gait problem.  Skin: Negative for pallor and rash.  Neurological: Positive for headaches. Negative for dizziness, tremors, weakness and numbness.  Psychiatric/Behavioral: Negative for confusion and sleep disturbance.   R TMJ is tender    Objective:   Physical Exam  Constitutional: She appears well-developed and well-nourished. No distress.       Obese  HENT:  Head: Normocephalic.  Right Ear: External ear normal.  Left Ear: External ear normal.  Nose: Nose normal.  Mouth/Throat: Oropharynx is clear and moist.  Eyes: Conjunctivae are normal. Pupils are equal, round, and reactive to light. Right eye exhibits no discharge. Left eye exhibits no discharge.  Neck: Normal range of motion. Neck supple. No JVD present. No tracheal deviation present. No thyromegaly present.  Cardiovascular: Normal rate, regular rhythm and normal heart sounds.   Pulmonary/Chest: No stridor. No respiratory distress. She has no wheezes.  Abdominal: Soft. Bowel sounds are normal. She exhibits no distension and no mass. There is no tenderness. There is no rebound and no guarding.  Musculoskeletal: She exhibits tenderness. She exhibits no edema.       R TMJ is painful    Lymphadenopathy:    She has no cervical adenopathy.  Neurological: She displays normal reflexes. No cranial nerve deficit. She exhibits normal muscle tone. Coordination normal.  Skin: No rash noted. No erythema.  Psychiatric: Her behavior is normal. Judgment and thought content normal.       anxious          Assessment & Plan:

## 2011-01-20 ENCOUNTER — Other Ambulatory Visit: Payer: Self-pay | Admitting: Diagnostic Neuroimaging

## 2011-01-20 DIAGNOSIS — R51 Headache: Secondary | ICD-10-CM

## 2011-01-20 DIAGNOSIS — M542 Cervicalgia: Secondary | ICD-10-CM

## 2011-01-24 ENCOUNTER — Ambulatory Visit
Admission: RE | Admit: 2011-01-24 | Discharge: 2011-01-24 | Disposition: A | Discharge status: Home / Self Care | Payer: PRIVATE HEALTH INSURANCE | Source: Ambulatory Visit | Attending: Diagnostic Neuroimaging | Admitting: Diagnostic Neuroimaging

## 2011-01-24 DIAGNOSIS — R51 Headache: Secondary | ICD-10-CM

## 2011-01-24 DIAGNOSIS — M542 Cervicalgia: Secondary | ICD-10-CM

## 2011-01-24 MED ORDER — GADOBENATE DIMEGLUMINE 529 MG/ML IV SOLN
20.0000 mL | Freq: Once | INTRAVENOUS | Status: AC | PRN
  Administered 2011-01-24: 20 mL via INTRAVENOUS

## 2011-01-28 ENCOUNTER — Telehealth: Payer: Self-pay | Admitting: *Deleted

## 2011-01-28 NOTE — Telephone Encounter (Signed)
OK to fill this prescription with additional refills x0 Thank you!  

## 2011-01-28 NOTE — Telephone Encounter (Signed)
rec rf req for Vicodin 5-500 mg 1-2 po bid prn. # 60. Last filled 12-16-10. Ok to Rf?

## 2011-01-29 MED ORDER — HYDROCODONE-ACETAMINOPHEN 5-500 MG PO TABS: 1.0000 | ORAL_TABLET | Freq: Two times a day (BID) | ORAL | Status: DC | PRN

## 2011-03-07 ENCOUNTER — Other Ambulatory Visit (INDEPENDENT_AMBULATORY_CARE_PROVIDER_SITE_OTHER): Payer: PRIVATE HEALTH INSURANCE

## 2011-03-07 ENCOUNTER — Ambulatory Visit (INDEPENDENT_AMBULATORY_CARE_PROVIDER_SITE_OTHER): Payer: PRIVATE HEALTH INSURANCE | Admitting: Internal Medicine

## 2011-03-07 ENCOUNTER — Encounter: Payer: Self-pay | Admitting: Internal Medicine

## 2011-03-07 VITALS — BP 100/68 | HR 88 | Temp 98.1°F | Resp 16 | Wt 200.0 lb

## 2011-03-07 DIAGNOSIS — IMO0001 Reserved for inherently not codable concepts without codable children: Secondary | ICD-10-CM

## 2011-03-07 DIAGNOSIS — E119 Type 2 diabetes mellitus without complications: Secondary | ICD-10-CM

## 2011-03-07 DIAGNOSIS — E1129 Type 2 diabetes mellitus with other diabetic kidney complication: Secondary | ICD-10-CM | POA: Insufficient documentation

## 2011-03-07 DIAGNOSIS — F411 Generalized anxiety disorder: Secondary | ICD-10-CM

## 2011-03-07 DIAGNOSIS — B379 Candidiasis, unspecified: Secondary | ICD-10-CM

## 2011-03-07 DIAGNOSIS — E1165 Type 2 diabetes mellitus with hyperglycemia: Secondary | ICD-10-CM

## 2011-03-07 DIAGNOSIS — E1169 Type 2 diabetes mellitus with other specified complication: Secondary | ICD-10-CM | POA: Insufficient documentation

## 2011-03-07 DIAGNOSIS — B372 Candidiasis of skin and nail: Secondary | ICD-10-CM

## 2011-03-07 DIAGNOSIS — E669 Obesity, unspecified: Secondary | ICD-10-CM | POA: Insufficient documentation

## 2011-03-07 LAB — COMPREHENSIVE METABOLIC PANEL
ALT: 54 U/L — ABNORMAL HIGH (ref 0–35)
AST: 50 U/L — ABNORMAL HIGH (ref 0–37)
Albumin: 3.9 g/dL (ref 3.5–5.2)
Alkaline Phosphatase: 80 U/L (ref 39–117)
BUN: 16 mg/dL (ref 6–23)
CO2: 28 mEq/L (ref 19–32)
Calcium: 9 mg/dL (ref 8.4–10.5)
Chloride: 106 mEq/L (ref 96–112)
Creatinine, Ser: 0.8 mg/dL (ref 0.4–1.2)
GFR: 77.03 mL/min (ref >60.00)
Glucose, Bld: 166 mg/dL — ABNORMAL HIGH (ref 70–99)
Potassium: 3.9 mEq/L (ref 3.5–5.1)
Sodium: 141 mEq/L (ref 135–145)
Total Bilirubin: 0.6 mg/dL (ref 0.3–1.2)
Total Protein: 6.7 g/dL (ref 6.0–8.3)

## 2011-03-07 LAB — HEMOGLOBIN A1C: Hgb A1c MFr Bld: 5.8 % (ref 4.6–6.5)

## 2011-03-07 MED ORDER — METFORMIN HCL 500 MG PO TABS: 500.0000 mg | ORAL_TABLET | Freq: Two times a day (BID) | ORAL | Status: DC

## 2011-03-07 MED ORDER — CLOTRIMAZOLE-BETAMETHASONE 1-0.05 % EX CREA: TOPICAL_CREAM | Freq: Two times a day (BID) | CUTANEOUS | Status: AC

## 2011-03-07 NOTE — Progress Notes (Signed)
  Subjective:    Patient ID: Amanda Arnold, female    DOB: 1945/12/29, 65 y.o.   MRN: [id number]  HPI   The patient is here to follow up on chronic depression, anxiety, headaches and chronic moderate fibromyalgia symptoms not too well controlled with medicines, diet and exercise. C/o rash under R breast. CBGs 150-200 at home, peeing a lot...   Review of Systems  Constitutional: Negative for chills, activity change, appetite change, fatigue and unexpected weight change.  HENT: Negative for congestion, mouth sores and sinus pressure.   Eyes: Negative for visual disturbance.  Respiratory: Negative for cough and chest tightness.   Gastrointestinal: Negative for nausea and abdominal pain.  Genitourinary: Negative for frequency, difficulty urinating and vaginal pain.  Musculoskeletal: Positive for back pain and arthralgias. Negative for gait problem.  Skin: Negative for pallor and rash.  Neurological: Negative for dizziness, tremors, weakness, numbness and headaches.  Psychiatric/Behavioral: Positive for sleep disturbance. Negative for confusion. The patient is nervous/anxious.    Wt Readings from Last 3 Encounters:  03/07/11 200 lb (90.719 kg)  01/03/11 204 lb (92.534 kg)  11/21/10 206 lb 12.8 oz (93.804 kg)       Objective:   Physical Exam  Constitutional: She appears well-developed. No distress.       obese  HENT:  Head: Normocephalic.  Right Ear: External ear normal.  Left Ear: External ear normal.  Nose: Nose normal.  Mouth/Throat: Oropharynx is clear and moist.  Eyes: Conjunctivae are normal. Pupils are equal, round, and reactive to light. Right eye exhibits no discharge. Left eye exhibits no discharge.  Neck: Normal range of motion. Neck supple. No JVD present. No tracheal deviation present. No thyromegaly present.  Cardiovascular: Normal rate, regular rhythm and normal heart sounds.   Pulmonary/Chest: No stridor. No respiratory distress. She has no wheezes.  Abdominal:  Soft. Bowel sounds are normal. She exhibits no distension and no mass. There is no tenderness. There is no rebound and no guarding.  Musculoskeletal: She exhibits no edema and no tenderness.  Lymphadenopathy:    She has no cervical adenopathy.  Neurological: She displays normal reflexes. No cranial nerve deficit. She exhibits normal muscle tone. Coordination normal.  Skin: Rash (under R breast) noted. No erythema.  Psychiatric: She has a normal mood and affect. Her behavior is normal. Judgment and thought content normal.          Assessment & Plan:

## 2011-03-07 NOTE — Assessment & Plan Note (Signed)
Worse. Increase metformin to bid

## 2011-03-07 NOTE — Assessment & Plan Note (Signed)
On Rx prn 

## 2011-03-07 NOTE — Assessment & Plan Note (Signed)
Start Lotrisone bid 

## 2011-03-14 ENCOUNTER — Other Ambulatory Visit: Payer: Self-pay | Admitting: Internal Medicine

## 2011-03-18 ENCOUNTER — Telehealth: Payer: Self-pay | Admitting: *Deleted

## 2011-03-18 NOTE — Telephone Encounter (Signed)
OK to fill this prescription with additional refills x1 Thank you!  

## 2011-03-18 NOTE — Telephone Encounter (Signed)
Rf req for Xanax 0.5mg  1/1-1 po bid # 60. Last filled 11-15-10. Ok to Rf?

## 2011-03-19 MED ORDER — ALPRAZOLAM 0.5 MG PO TABS: 0.2500 mg | ORAL_TABLET | Freq: Two times a day (BID) | ORAL | Status: DC

## 2011-03-24 ENCOUNTER — Telehealth: Payer: Self-pay | Admitting: *Deleted

## 2011-03-24 NOTE — Telephone Encounter (Signed)
Pharm faxed RF req for VIcodin 5/500 1-2 bid prn #60

## 2011-03-25 NOTE — Telephone Encounter (Signed)
OK to fill this prescription with additional refills x0 Thank you!  

## 2011-03-26 MED ORDER — HYDROCODONE-ACETAMINOPHEN 5-500 MG PO TABS: 1.0000 | ORAL_TABLET | Freq: Two times a day (BID) | ORAL | Status: DC | PRN

## 2011-04-04 ENCOUNTER — Ambulatory Visit: Payer: PRIVATE HEALTH INSURANCE | Admitting: Internal Medicine

## 2011-04-07 LAB — POCT URINALYSIS DIP (DEVICE)
Bilirubin Urine: NEGATIVE
Glucose, UA: NEGATIVE
Hgb urine dipstick: NEGATIVE
Ketones, ur: NEGATIVE
Nitrite: NEGATIVE
Operator id: 235561
Protein, ur: NEGATIVE
Specific Gravity, Urine: 1.015
Urobilinogen, UA: 1
pH: 7.5

## 2011-04-15 ENCOUNTER — Other Ambulatory Visit: Payer: Self-pay | Admitting: *Deleted

## 2011-04-15 MED ORDER — METOPROLOL SUCCINATE ER 25 MG PO TB24: 25.0000 mg | ORAL_TABLET | Freq: Every day | ORAL | Status: DC

## 2011-04-28 LAB — COMPREHENSIVE METABOLIC PANEL
ALT: 52 — ABNORMAL HIGH
AST: 66 — ABNORMAL HIGH
Albumin: 3.8
Alkaline Phosphatase: 57
BUN: 13
CO2: 24
Calcium: 9.1
Chloride: 105
Creatinine, Ser: 0.8
GFR calc Af Amer: >60
GFR calc non Af Amer: >60
Glucose, Bld: 99
Potassium: 3.4 — ABNORMAL LOW
Sodium: 137
Total Bilirubin: 0.6
Total Protein: 6.3

## 2011-04-28 LAB — POCT CARDIAC MARKERS
CKMB, poc: <1 — ABNORMAL LOW
CKMB, poc: <1 — ABNORMAL LOW
Myoglobin, poc: 49.4
Myoglobin, poc: 74.6
Operator id: 272551
Operator id: 272551
Troponin i, poc: <0.05
Troponin i, poc: <0.05

## 2011-04-28 LAB — CBC
HCT: 38.7
Hemoglobin: 13.2
MCHC: 34.2
MCV: 93.2
Platelets: 232
RBC: 4.15
RDW: 12.9
WBC: 6.9

## 2011-04-28 LAB — DIFFERENTIAL
Basophils Absolute: 0
Basophils Relative: 1
Eosinophils Absolute: 0.1
Eosinophils Relative: 2
Lymphocytes Relative: 41
Lymphs Abs: 2.8
Monocytes Absolute: 0.4
Monocytes Relative: 6
Neutro Abs: 3.5
Neutrophils Relative %: 51

## 2011-04-28 LAB — APTT: aPTT: 30

## 2011-05-06 ENCOUNTER — Telehealth: Payer: Self-pay | Admitting: *Deleted

## 2011-05-06 NOTE — Telephone Encounter (Signed)
Rf req for Vicodin 5-500 1-2 po bid #60. Last filled 03-26-11. Ok to Rf?

## 2011-05-07 MED ORDER — HYDROCODONE-ACETAMINOPHEN 5-500 MG PO TABS: 1.0000 | ORAL_TABLET | Freq: Two times a day (BID) | ORAL | Status: DC | PRN

## 2011-05-07 NOTE — Telephone Encounter (Signed)
OK to fill this prescription with additional refills x0 Thank you!  

## 2011-05-23 ENCOUNTER — Ambulatory Visit (INDEPENDENT_AMBULATORY_CARE_PROVIDER_SITE_OTHER): Payer: PRIVATE HEALTH INSURANCE | Admitting: Internal Medicine

## 2011-05-23 ENCOUNTER — Encounter: Payer: Self-pay | Admitting: Internal Medicine

## 2011-05-23 VITALS — BP 130/76 | HR 80 | Temp 98.0°F | Resp 16 | Wt 211.0 lb

## 2011-05-23 DIAGNOSIS — IMO0001 Reserved for inherently not codable concepts without codable children: Secondary | ICD-10-CM

## 2011-05-23 DIAGNOSIS — E669 Obesity, unspecified: Secondary | ICD-10-CM

## 2011-05-23 DIAGNOSIS — F329 Major depressive disorder, single episode, unspecified: Secondary | ICD-10-CM

## 2011-05-23 DIAGNOSIS — E119 Type 2 diabetes mellitus without complications: Secondary | ICD-10-CM

## 2011-05-23 DIAGNOSIS — E1169 Type 2 diabetes mellitus with other specified complication: Secondary | ICD-10-CM

## 2011-05-23 MED ORDER — HYDROCODONE-ACETAMINOPHEN 5-500 MG PO TABS: 1.0000 | ORAL_TABLET | Freq: Two times a day (BID) | ORAL | Status: DC | PRN

## 2011-05-23 MED ORDER — ESCITALOPRAM OXALATE 20 MG PO TABS: 20.0000 mg | ORAL_TABLET | Freq: Every day | ORAL | Status: DC

## 2011-05-23 NOTE — Assessment & Plan Note (Signed)
2012 chronic Continue with current prescription therapy as reflected on the Med list.

## 2011-05-23 NOTE — Progress Notes (Signed)
  Subjective:    Patient ID: Amanda Arnold, female    DOB: March 30, 1946, 65 y.o.   MRN: [id number]  HPI   The patient is here to follow up on chronic HTN, depression, anxiety, headaches and chronic moderate fibromyalgia/LBP symptoms controlled with medicines, diet and exercise. She is trying to stop smoking now. C/o acne   Review of Systems  Constitutional: Positive for fatigue and unexpected weight change. Negative for chills, activity change and appetite change.  HENT: Negative for congestion, mouth sores and sinus pressure.   Eyes: Negative for visual disturbance.  Respiratory: Negative for cough and chest tightness.   Gastrointestinal: Negative for nausea and abdominal pain.  Genitourinary: Negative for frequency, difficulty urinating and vaginal pain.  Musculoskeletal: Positive for back pain and arthralgias. Negative for gait problem.  Skin: Negative for pallor and rash.  Neurological: Negative for dizziness, tremors, weakness, numbness and headaches.  Psychiatric/Behavioral: Positive for sleep disturbance. Negative for suicidal ideas and confusion. The patient is nervous/anxious.    Wt Readings from Last 3 Encounters:  05/23/11 211 lb (95.709 kg)  03/07/11 200 lb (90.719 kg)  01/03/11 204 lb (92.534 kg)       Objective:   Physical Exam  Constitutional: She appears well-developed. No distress.       obese  HENT:  Head: Normocephalic.  Right Ear: External ear normal.  Left Ear: External ear normal.  Nose: Nose normal.  Mouth/Throat: Oropharynx is clear and moist.  Eyes: Conjunctivae are normal. Pupils are equal, round, and reactive to light. Right eye exhibits no discharge. Left eye exhibits no discharge.  Neck: Normal range of motion. Neck supple. No JVD present. No tracheal deviation present. No thyromegaly present.  Cardiovascular: Normal rate, regular rhythm and normal heart sounds.   Pulmonary/Chest: No stridor. No respiratory distress. She has no wheezes.    Abdominal: Soft. Bowel sounds are normal. She exhibits no distension and no mass. There is no tenderness. There is no rebound and no guarding.  Musculoskeletal: She exhibits tenderness. She exhibits no edema.  Lymphadenopathy:    She has no cervical adenopathy.  Neurological: She displays normal reflexes. No cranial nerve deficit. She exhibits normal muscle tone. Coordination normal.  Skin: No rash noted. No erythema.  Psychiatric: She has a normal mood and affect. Her behavior is normal. Judgment and thought content normal.  Acne, moles on face  Multiple black head large acne on B cheeks were removed. Tol well.      Assessment & Plan:

## 2011-05-23 NOTE — Assessment & Plan Note (Signed)
Will start Lexapro in place of Celexa

## 2011-05-23 NOTE — Assessment & Plan Note (Signed)
Continue with current prescription therapy as reflected on the Med list.  

## 2011-05-23 NOTE — Assessment & Plan Note (Signed)
Worse. She is trying to stop smoking. Diet discussed

## 2011-05-23 NOTE — Patient Instructions (Signed)
Try Valerian root at bedtime 

## 2011-06-13 ENCOUNTER — Ambulatory Visit (INDEPENDENT_AMBULATORY_CARE_PROVIDER_SITE_OTHER): Payer: PRIVATE HEALTH INSURANCE | Admitting: Internal Medicine

## 2011-06-13 ENCOUNTER — Encounter: Payer: Self-pay | Admitting: Internal Medicine

## 2011-06-13 VITALS — BP 160/85 | HR 72 | Temp 98.0°F | Resp 12 | Wt 207.0 lb

## 2011-06-13 DIAGNOSIS — F329 Major depressive disorder, single episode, unspecified: Secondary | ICD-10-CM

## 2011-06-13 DIAGNOSIS — I10 Essential (primary) hypertension: Secondary | ICD-10-CM

## 2011-06-13 DIAGNOSIS — R42 Dizziness and giddiness: Secondary | ICD-10-CM

## 2011-06-13 DIAGNOSIS — R55 Syncope and collapse: Secondary | ICD-10-CM

## 2011-06-13 MED ORDER — MECLIZINE HCL 12.5 MG PO TABS: 12.5000 mg | ORAL_TABLET | Freq: Three times a day (TID) | ORAL | Status: AC | PRN

## 2011-06-13 NOTE — Assessment & Plan Note (Signed)
Meclizine Benign Positional Vertigo symptoms on the left. Start Meclizine. Start Francee Piccolo - Daroff exercise several times a day as dirrected.

## 2011-06-13 NOTE — Progress Notes (Signed)
  Subjective:    Patient ID: Amanda Arnold, female    DOB: 09-27-1945, 65 y.o.   MRN: [id number]  HPI  C/o dizziness - room spinning Passed out 2 d ago  Review of Systems  Constitutional: Positive for fatigue. Negative for fever, chills, diaphoresis, activity change, appetite change and unexpected weight change.  HENT: Negative for hearing loss, ear pain, nosebleeds, congestion, sore throat, facial swelling, rhinorrhea, sneezing, mouth sores, trouble swallowing, neck pain, neck stiffness, postnasal drip, sinus pressure and tinnitus.   Eyes: Negative for pain, discharge, redness, itching and visual disturbance.  Respiratory: Negative for cough, chest tightness, shortness of breath, wheezing and stridor.   Cardiovascular: Negative for chest pain, palpitations and leg swelling.  Gastrointestinal: Negative for nausea, diarrhea, constipation, blood in stool, abdominal distention, anal bleeding and rectal pain.  Genitourinary: Negative for dysuria, urgency, frequency, hematuria, flank pain, vaginal bleeding, vaginal discharge, difficulty urinating, genital sores and pelvic pain.  Musculoskeletal: Negative for back pain, joint swelling, arthralgias and gait problem.  Skin: Negative.  Negative for rash.  Neurological: Positive for dizziness, weakness and light-headedness. Negative for tremors, seizures, syncope, speech difficulty, numbness and headaches.  Hematological: Negative for adenopathy. Does not bruise/bleed easily.  Psychiatric/Behavioral: Negative for suicidal ideas, behavioral problems, sleep disturbance, dysphoric mood and decreased concentration. The patient is not nervous/anxious.        Objective:   Physical Exam  Constitutional: She appears well-developed. No distress.  HENT:  Head: Normocephalic.  Right Ear: External ear normal.  Left Ear: External ear normal.  Nose: Nose normal.  Mouth/Throat: Oropharynx is clear and moist.  Eyes: Conjunctivae are normal. Pupils are  equal, round, and reactive to light. Right eye exhibits no discharge. Left eye exhibits no discharge.  Neck: Normal range of motion. Neck supple. No JVD present. No tracheal deviation present. No thyromegaly present.  Cardiovascular: Normal rate, regular rhythm and normal heart sounds.   Pulmonary/Chest: No stridor. No respiratory distress. She has no wheezes.  Abdominal: Soft. Bowel sounds are normal. She exhibits no distension and no mass. There is no tenderness. There is no rebound and no guarding.  Musculoskeletal: She exhibits no edema and no tenderness.  Lymphadenopathy:    She has no cervical adenopathy.  Neurological: She displays normal reflexes. No cranial nerve deficit. She exhibits normal muscle tone. Coordination normal.  Skin: No rash noted. No erythema.  Psychiatric: She has a normal mood and affect. Her behavior is normal. Judgment and thought content normal.   H-P (+) on L       Assessment & Plan:  Benign Positional Vertigo symptoms on the left. Start Meclizine. Start Francee Piccolo - Daroff exercise several times a day as dirrected.

## 2011-06-13 NOTE — Patient Instructions (Signed)
Benign Positional Vertigo symptoms on the left. Start Meclizine. Start Brandt - Daroff exercise several times a day as dirrected. 

## 2011-06-15 ENCOUNTER — Encounter: Payer: Self-pay | Admitting: Internal Medicine

## 2011-06-15 NOTE — Assessment & Plan Note (Signed)
Continue with current prescription therapy as reflected on the Med list.  

## 2011-06-15 NOTE — Assessment & Plan Note (Signed)
Discussed.

## 2011-06-15 NOTE — Assessment & Plan Note (Signed)
Discussed w/pt 11/12 due to BPV

## 2011-08-04 ENCOUNTER — Telehealth: Payer: Self-pay | Admitting: *Deleted

## 2011-08-04 NOTE — Telephone Encounter (Signed)
Rf req xanax 0.5mg  1/2-1 po bid prn # 60. Last filled 12.2.12. Ok to Rf in AVP's absence?

## 2011-08-04 NOTE — Telephone Encounter (Signed)
Ok for refill x2 

## 2011-08-05 MED ORDER — ALPRAZOLAM 0.5 MG PO TABS
0.2500 mg | ORAL_TABLET | Freq: Two times a day (BID) | ORAL | Status: DC
Start: 2011-08-05 — End: 2011-12-24

## 2011-08-21 ENCOUNTER — Telehealth: Payer: Self-pay | Admitting: *Deleted

## 2011-08-21 ENCOUNTER — Other Ambulatory Visit: Payer: Self-pay | Admitting: *Deleted

## 2011-08-21 MED ORDER — METOPROLOL SUCCINATE ER 25 MG PO TB24: 25.0000 mg | ORAL_TABLET | Freq: Every day | ORAL | Status: DC

## 2011-08-21 NOTE — Telephone Encounter (Signed)
Pt left vm stating she has been out of her blood pressure med X 8 days. She states she has OV scheduled tom with AVP.  I advised her that we have not received any rf requests from her phramacy on her behalf. However, I just sent rf to her pharmacy while talking to her on the phone.  She c/o left arm dull pain, dull HA and chest pain. I advised her to go to ER if her symptoms worsen. Pt understands.

## 2011-08-22 ENCOUNTER — Other Ambulatory Visit: Payer: PRIVATE HEALTH INSURANCE

## 2011-08-22 ENCOUNTER — Ambulatory Visit (INDEPENDENT_AMBULATORY_CARE_PROVIDER_SITE_OTHER): Payer: PRIVATE HEALTH INSURANCE | Admitting: Internal Medicine

## 2011-08-22 ENCOUNTER — Telehealth: Payer: Self-pay | Admitting: Internal Medicine

## 2011-08-22 ENCOUNTER — Encounter: Payer: Self-pay | Admitting: Internal Medicine

## 2011-08-22 ENCOUNTER — Other Ambulatory Visit (INDEPENDENT_AMBULATORY_CARE_PROVIDER_SITE_OTHER): Payer: PRIVATE HEALTH INSURANCE

## 2011-08-22 VITALS — BP 130/80 | HR 84 | Temp 97.0°F | Resp 16 | Wt 212.0 lb

## 2011-08-22 DIAGNOSIS — E669 Obesity, unspecified: Secondary | ICD-10-CM

## 2011-08-22 DIAGNOSIS — M25519 Pain in unspecified shoulder: Secondary | ICD-10-CM

## 2011-08-22 DIAGNOSIS — M25512 Pain in left shoulder: Secondary | ICD-10-CM

## 2011-08-22 DIAGNOSIS — R300 Dysuria: Secondary | ICD-10-CM

## 2011-08-22 DIAGNOSIS — IMO0001 Reserved for inherently not codable concepts without codable children: Secondary | ICD-10-CM

## 2011-08-22 DIAGNOSIS — R42 Dizziness and giddiness: Secondary | ICD-10-CM

## 2011-08-22 DIAGNOSIS — E1169 Type 2 diabetes mellitus with other specified complication: Secondary | ICD-10-CM

## 2011-08-22 DIAGNOSIS — I10 Essential (primary) hypertension: Secondary | ICD-10-CM

## 2011-08-22 DIAGNOSIS — E119 Type 2 diabetes mellitus without complications: Secondary | ICD-10-CM

## 2011-08-22 DIAGNOSIS — R55 Syncope and collapse: Secondary | ICD-10-CM

## 2011-08-22 LAB — URINALYSIS, ROUTINE W REFLEX MICROSCOPIC
Bilirubin Urine: NEGATIVE
Hgb urine dipstick: NEGATIVE
Ketones, ur: NEGATIVE
Nitrite: NEGATIVE
Specific Gravity, Urine: 1.025 (ref 1.000–1.030)
Total Protein, Urine: NEGATIVE
Urine Glucose: NEGATIVE
Urobilinogen, UA: 0.2 (ref 0.0–1.0)
pH: 6 (ref 5.0–8.0)

## 2011-08-22 LAB — BASIC METABOLIC PANEL
BUN: 17 mg/dL (ref 6–23)
CO2: 31 mEq/L (ref 19–32)
Calcium: 9 mg/dL (ref 8.4–10.5)
Chloride: 104 mEq/L (ref 96–112)
Creatinine, Ser: 0.7 mg/dL (ref 0.4–1.2)
GFR: 85.49 mL/min (ref >60.00)
Glucose, Bld: 88 mg/dL (ref 70–99)
Potassium: 4.4 mEq/L (ref 3.5–5.1)
Sodium: 139 mEq/L (ref 135–145)

## 2011-08-22 LAB — HEMOGLOBIN A1C: Hgb A1c MFr Bld: 5.9 % (ref 4.6–6.5)

## 2011-08-22 MED ORDER — HYDROCODONE-ACETAMINOPHEN 5-500 MG PO TABS: 1.0000 | ORAL_TABLET | Freq: Two times a day (BID) | ORAL | Status: DC | PRN

## 2011-08-22 MED ORDER — MELOXICAM 7.5 MG PO TABS: 7.5000 mg | ORAL_TABLET | Freq: Every day | ORAL | Status: AC

## 2011-08-22 NOTE — Telephone Encounter (Signed)
Stacey, please, inform patient that all labs are ok Thx 

## 2011-08-22 NOTE — Progress Notes (Signed)
Patient ID: Amanda Arnold, female   DOB: Sep 23, 1945, 66 y.o.   MRN: [id number]  Subjective:    Patient ID: Amanda Arnold, female    DOB: December 28, 1945, 66 y.o.   MRN: [id number]  Arm Pain  Pertinent negatives include no numbness.     The patient is here to follow up on chronic HTN, depression, anxiety, headaches and chronic moderate fibromyalgia/LBP symptoms controlled with medicines, diet and exercise. She is trying to stop smoking now. C/o L shoulder pain x weeks   Review of Systems  Constitutional: Positive for fatigue and unexpected weight change. Negative for chills, activity change and appetite change.  HENT: Negative for congestion, mouth sores and sinus pressure.   Eyes: Negative for visual disturbance.  Respiratory: Negative for cough and chest tightness.   Gastrointestinal: Negative for nausea and abdominal pain.  Genitourinary: Negative for frequency, difficulty urinating and vaginal pain.  Musculoskeletal: Positive for back pain and arthralgias. Negative for gait problem.  Skin: Negative for pallor and rash.  Neurological: Negative for dizziness, tremors, weakness, numbness and headaches.  Psychiatric/Behavioral: Positive for sleep disturbance. Negative for suicidal ideas and confusion. The patient is nervous/anxious.    Wt Readings from Last 3 Encounters:  08/22/11 212 lb 0.6 oz (96.181 kg)  06/13/11 207 lb (93.895 kg)  05/23/11 211 lb (95.709 kg)       Objective:   Physical Exam  Constitutional: She appears well-developed. No distress.       obese  HENT:  Head: Normocephalic.  Right Ear: External ear normal.  Left Ear: External ear normal.  Nose: Nose normal.  Mouth/Throat: Oropharynx is clear and moist.  Eyes: Conjunctivae are normal. Pupils are equal, round, and reactive to light. Right eye exhibits no discharge. Left eye exhibits no discharge.  Neck: Normal range of motion. Neck supple. No JVD present. No tracheal deviation present. No thyromegaly present.    Cardiovascular: Normal rate, regular rhythm and normal heart sounds.   Pulmonary/Chest: No stridor. No respiratory distress. She has no wheezes.  Abdominal: Soft. Bowel sounds are normal. She exhibits no distension and no mass. There is no tenderness. There is no rebound and no guarding.  Musculoskeletal: She exhibits tenderness. She exhibits no edema.  Lymphadenopathy:    She has no cervical adenopathy.  Neurological: She displays normal reflexes. No cranial nerve deficit. She exhibits normal muscle tone. Coordination normal.  Skin: No rash noted. No erythema.  Psychiatric: She has a normal mood and affect. Her behavior is normal. Judgment and thought content normal.  Acne, moles on face L rot cuff tests are + Multiple black head large acne on B cheeks were removed. Tol well.  Wt Readings from Last 3 Encounters:  08/22/11 212 lb 0.6 oz (96.181 kg)  06/13/11 207 lb (93.895 kg)  05/23/11 211 lb (95.709 kg)       Assessment & Plan:

## 2011-08-22 NOTE — Assessment & Plan Note (Signed)
Declined injection ROM exercises Melox 7.5 mg

## 2011-08-22 NOTE — Telephone Encounter (Signed)
Pt informed

## 2011-08-23 NOTE — Assessment & Plan Note (Signed)
Continue with current prescription therapy as reflected on the Med list.  

## 2011-08-23 NOTE — Assessment & Plan Note (Signed)
Wt Readings from Last 3 Encounters:  08/22/11 212 lb 0.6 oz (96.181 kg)  06/13/11 207 lb (93.895 kg)  05/23/11 211 lb (95.709 kg)  Diet discussed

## 2011-08-23 NOTE — Assessment & Plan Note (Signed)
No relapse 

## 2011-09-28 NOTE — Progress Notes (Signed)
  Subjective:    Patient ID: Amanda Arnold, female    DOB: [birth year], 66 y.o.   MRN: [id number]  HPI    Review of Systems     Objective:   Physical Exam        Assessment & Plan:

## 2011-10-13 ENCOUNTER — Ambulatory Visit: Payer: PRIVATE HEALTH INSURANCE | Admitting: Internal Medicine

## 2011-10-14 ENCOUNTER — Encounter: Payer: Self-pay | Admitting: Internal Medicine

## 2011-10-14 ENCOUNTER — Ambulatory Visit (INDEPENDENT_AMBULATORY_CARE_PROVIDER_SITE_OTHER): Payer: PRIVATE HEALTH INSURANCE | Admitting: Internal Medicine

## 2011-10-14 VITALS — BP 120/70 | HR 80 | Temp 97.6°F | Resp 16 | Wt 211.0 lb

## 2011-10-14 DIAGNOSIS — R209 Unspecified disturbances of skin sensation: Secondary | ICD-10-CM

## 2011-10-14 DIAGNOSIS — F4541 Pain disorder exclusively related to psychological factors: Secondary | ICD-10-CM

## 2011-10-14 DIAGNOSIS — IMO0001 Reserved for inherently not codable concepts without codable children: Secondary | ICD-10-CM

## 2011-10-14 DIAGNOSIS — F411 Generalized anxiety disorder: Secondary | ICD-10-CM

## 2011-10-14 DIAGNOSIS — E119 Type 2 diabetes mellitus without complications: Secondary | ICD-10-CM

## 2011-10-14 DIAGNOSIS — E669 Obesity, unspecified: Secondary | ICD-10-CM

## 2011-10-14 DIAGNOSIS — F17200 Nicotine dependence, unspecified, uncomplicated: Secondary | ICD-10-CM

## 2011-10-14 DIAGNOSIS — M545 Low back pain, unspecified: Secondary | ICD-10-CM

## 2011-10-14 DIAGNOSIS — I10 Essential (primary) hypertension: Secondary | ICD-10-CM

## 2011-10-14 MED ORDER — HYDROCODONE-ACETAMINOPHEN 5-500 MG PO TABS: 1.0000 | ORAL_TABLET | Freq: Two times a day (BID) | ORAL | Status: DC | PRN

## 2011-10-14 MED ORDER — GABAPENTIN 100 MG PO CAPS: 100.0000 mg | ORAL_CAPSULE | Freq: Two times a day (BID) | ORAL | Status: DC | PRN

## 2011-10-14 NOTE — Assessment & Plan Note (Signed)
Discussed again 

## 2011-10-14 NOTE — Assessment & Plan Note (Signed)
Continue with current prescription therapy as reflected on the Med list.  

## 2011-10-14 NOTE — Assessment & Plan Note (Signed)
Diet discussed 

## 2011-10-14 NOTE — Assessment & Plan Note (Signed)
Add Gabapentin at hs prn

## 2011-10-14 NOTE — Progress Notes (Signed)
Patient ID: Amanda Arnold, female   DOB: 1946-01-13, 66 y.o.   MRN: [id number]  Subjective:    Patient ID: Amanda Arnold, female    DOB: 1945/07/28, 66 y.o.   MRN: [id number]  HPI  C/o pains all over. C/o burning pain in R thigh - severe The patient is here to follow up on chronic HTN, depression, anxiety, headaches and chronic moderate fibromyalgia/LBP symptoms controlled with medicines, diet and exercise. She is not trying to stop smoking now.    Review of Systems  Constitutional: Positive for fatigue and unexpected weight change. Negative for chills, activity change and appetite change.  HENT: Negative for congestion, mouth sores and sinus pressure.   Eyes: Negative for visual disturbance.  Respiratory: Negative for cough and chest tightness.   Gastrointestinal: Negative for nausea and abdominal pain.  Genitourinary: Negative for frequency, difficulty urinating and vaginal pain.  Musculoskeletal: Positive for back pain and arthralgias. Negative for gait problem.  Skin: Negative for pallor and rash.  Neurological: Negative for dizziness, tremors, weakness, numbness and headaches.  Psychiatric/Behavioral: Positive for sleep disturbance. Negative for suicidal ideas and confusion. The patient is nervous/anxious.    Wt Readings from Last 3 Encounters:  10/14/11 211 lb (95.709 kg)  08/22/11 212 lb 0.6 oz (96.181 kg)  06/13/11 207 lb (93.895 kg)       Objective:   Physical Exam  Constitutional: She appears well-developed. No distress.       obese  HENT:  Head: Normocephalic.  Right Ear: External ear normal.  Left Ear: External ear normal.  Nose: Nose normal.  Mouth/Throat: Oropharynx is clear and moist.  Eyes: Conjunctivae are normal. Pupils are equal, round, and reactive to light. Right eye exhibits no discharge. Left eye exhibits no discharge.  Neck: Normal range of motion. Neck supple. No JVD present. No tracheal deviation present. No thyromegaly present.  Cardiovascular:  Normal rate, regular rhythm and normal heart sounds.   Pulmonary/Chest: No stridor. No respiratory distress. She has no wheezes.  Abdominal: Soft. Bowel sounds are normal. She exhibits no distension and no mass. There is no tenderness. There is no rebound and no guarding.  Musculoskeletal: She exhibits tenderness. She exhibits no edema.  Lymphadenopathy:    She has no cervical adenopathy.  Neurological: She displays normal reflexes. No cranial nerve deficit. She exhibits normal muscle tone. Coordination normal.  Skin: No rash noted. No erythema.  Psychiatric: She has a normal mood and affect. Her behavior is normal. Judgment and thought content normal.  Acne, moles on face        Assessment & Plan:

## 2011-11-21 ENCOUNTER — Ambulatory Visit: Payer: PRIVATE HEALTH INSURANCE | Admitting: Internal Medicine

## 2011-12-24 ENCOUNTER — Telehealth: Payer: Self-pay | Admitting: Internal Medicine

## 2011-12-24 ENCOUNTER — Ambulatory Visit (INDEPENDENT_AMBULATORY_CARE_PROVIDER_SITE_OTHER): Payer: PRIVATE HEALTH INSURANCE | Admitting: Internal Medicine

## 2011-12-24 ENCOUNTER — Other Ambulatory Visit (INDEPENDENT_AMBULATORY_CARE_PROVIDER_SITE_OTHER): Payer: PRIVATE HEALTH INSURANCE

## 2011-12-24 ENCOUNTER — Encounter: Payer: Self-pay | Admitting: Internal Medicine

## 2011-12-24 VITALS — BP 120/60 | HR 84 | Temp 98.0°F | Resp 16 | Wt 209.0 lb

## 2011-12-24 DIAGNOSIS — F450 Somatization disorder: Secondary | ICD-10-CM

## 2011-12-24 DIAGNOSIS — R351 Nocturia: Secondary | ICD-10-CM

## 2011-12-24 DIAGNOSIS — F411 Generalized anxiety disorder: Secondary | ICD-10-CM

## 2011-12-24 DIAGNOSIS — IMO0001 Reserved for inherently not codable concepts without codable children: Secondary | ICD-10-CM

## 2011-12-24 DIAGNOSIS — E119 Type 2 diabetes mellitus without complications: Secondary | ICD-10-CM

## 2011-12-24 DIAGNOSIS — R5383 Other fatigue: Secondary | ICD-10-CM

## 2011-12-24 DIAGNOSIS — R5381 Other malaise: Secondary | ICD-10-CM

## 2011-12-24 DIAGNOSIS — J449 Chronic obstructive pulmonary disease, unspecified: Secondary | ICD-10-CM

## 2011-12-24 LAB — URINALYSIS, ROUTINE W REFLEX MICROSCOPIC
Bilirubin Urine: NEGATIVE
Hgb urine dipstick: NEGATIVE
Ketones, ur: NEGATIVE
Nitrite: NEGATIVE
Specific Gravity, Urine: >=1.03 (ref 1.000–1.030)
Total Protein, Urine: NEGATIVE
Urine Glucose: NEGATIVE
Urobilinogen, UA: 0.2 (ref 0.0–1.0)
pH: 5.5 (ref 5.0–8.0)

## 2011-12-24 LAB — BASIC METABOLIC PANEL
BUN: 17 mg/dL (ref 6–23)
CO2: 23 mEq/L (ref 19–32)
Calcium: 9.2 mg/dL (ref 8.4–10.5)
Chloride: 104 mEq/L (ref 96–112)
Creatinine, Ser: 0.7 mg/dL (ref 0.4–1.2)
GFR: 89.64 mL/min (ref >60.00)
Glucose, Bld: 104 mg/dL — ABNORMAL HIGH (ref 70–99)
Potassium: 4.2 mEq/L (ref 3.5–5.1)
Sodium: 136 mEq/L (ref 135–145)

## 2011-12-24 LAB — HEMOGLOBIN A1C: Hgb A1c MFr Bld: 5.9 % (ref 4.6–6.5)

## 2011-12-24 MED ORDER — OXYBUTYNIN CHLORIDE 5 MG PO TABS: 5.0000 mg | ORAL_TABLET | Freq: Every day | ORAL | Status: DC

## 2011-12-24 MED ORDER — HYDROCODONE-ACETAMINOPHEN 5-500 MG PO TABS: 1.0000 | ORAL_TABLET | Freq: Two times a day (BID) | ORAL | Status: DC | PRN

## 2011-12-24 MED ORDER — CIPROFLOXACIN HCL 250 MG PO TABS: 250.0000 mg | ORAL_TABLET | Freq: Two times a day (BID) | ORAL | Status: AC

## 2011-12-24 MED ORDER — ALPRAZOLAM 0.5 MG PO TABS: 0.2500 mg | ORAL_TABLET | Freq: Two times a day (BID) | ORAL | Status: DC

## 2011-12-24 NOTE — Assessment & Plan Note (Signed)
Continue with current prescription therapy as reflected on the Med list.  

## 2011-12-24 NOTE — Telephone Encounter (Signed)
Pt informed

## 2011-12-24 NOTE — Assessment & Plan Note (Signed)
Discussed.

## 2011-12-24 NOTE — Progress Notes (Signed)
   Subjective:    Patient ID: Amanda Arnold, female    DOB: 11/15/1945, 66 y.o.   MRN: [id number]  HPI  C/o fatigue. C/o peeing at night a lot... The patient is here to follow up on chronic HTN, depression, anxiety, headaches and chronic moderate fibromyalgia/LBP symptoms controlled with medicines, diet and exercise. She is not trying to stop smoking now.   Wt Readings from Last 3 Encounters:  12/24/11 209 lb (94.802 kg)  10/14/11 211 lb (95.709 kg)  08/22/11 212 lb 0.6 oz (96.181 kg)   BP Readings from Last 3 Encounters:  12/24/11 120/60  10/14/11 120/70  08/22/11 130/80      Review of Systems  Constitutional: Positive for fatigue and unexpected weight change. Negative for chills, activity change and appetite change.  HENT: Negative for congestion, mouth sores and sinus pressure.   Eyes: Negative for visual disturbance.  Respiratory: Negative for cough and chest tightness.   Gastrointestinal: Negative for nausea and abdominal pain.  Genitourinary: Negative for frequency, difficulty urinating and vaginal pain.  Musculoskeletal: Positive for back pain and arthralgias. Negative for gait problem.  Skin: Negative for pallor and rash.  Neurological: Negative for dizziness, tremors, weakness, numbness and headaches.  Psychiatric/Behavioral: Positive for disturbed wake/sleep cycle. Negative for suicidal ideas and confusion. The patient is nervous/anxious.    Wt Readings from Last 3 Encounters:  12/24/11 209 lb (94.802 kg)  10/14/11 211 lb (95.709 kg)  08/22/11 212 lb 0.6 oz (96.181 kg)       Objective:   Physical Exam  Constitutional: She appears well-developed. No distress.       obese  HENT:  Head: Normocephalic.  Right Ear: External ear normal.  Left Ear: External ear normal.  Nose: Nose normal.  Mouth/Throat: Oropharynx is clear and moist.  Eyes: Conjunctivae are normal. Pupils are equal, round, and reactive to light. Right eye exhibits no discharge. Left eye  exhibits no discharge.  Neck: Normal range of motion. Neck supple. No JVD present. No tracheal deviation present. No thyromegaly present.  Cardiovascular: Normal rate, regular rhythm and normal heart sounds.   Pulmonary/Chest: No stridor. No respiratory distress. She has no wheezes.  Abdominal: Soft. Bowel sounds are normal. She exhibits no distension and no mass. There is no tenderness. There is no rebound and no guarding.  Musculoskeletal: She exhibits tenderness. She exhibits no edema.  Lymphadenopathy:    She has no cervical adenopathy.  Neurological: She displays normal reflexes. No cranial nerve deficit. She exhibits normal muscle tone. Coordination normal.  Skin: No rash noted. No erythema.  Psychiatric: She has a normal mood and affect. Her behavior is normal. Judgment and thought content normal.  Acne, moles on face  Lab Results  Component Value Date   WBC 6.0 09/23/2010   HGB 13.8 09/23/2010   HCT 40.2 09/23/2010   PLT 198.0 09/23/2010   GLUCOSE 88 08/22/2011   ALT 54* 03/07/2011   AST 50* 03/07/2011   NA 139 08/22/2011   K 4.4 08/22/2011   CL 104 08/22/2011   CREATININE 0.7 08/22/2011   BUN 17 08/22/2011   CO2 31 08/22/2011   TSH 0.78 05/15/2010   HGBA1C 5.9 08/22/2011         Assessment & Plan:

## 2011-12-24 NOTE — Telephone Encounter (Signed)
Stacey, please, inform patient that all labs are normal except for UTI Take cipro Thx  

## 2011-12-24 NOTE — Assessment & Plan Note (Addendum)
Take Metformin bid as dirr Risks associated with treatment noncompliance were discussed. Compliance was encouraged.

## 2012-02-20 ENCOUNTER — Other Ambulatory Visit: Payer: Self-pay | Admitting: Endocrinology

## 2012-03-01 ENCOUNTER — Other Ambulatory Visit: Payer: Self-pay | Admitting: Internal Medicine

## 2012-03-12 ENCOUNTER — Ambulatory Visit (INDEPENDENT_AMBULATORY_CARE_PROVIDER_SITE_OTHER): Payer: PRIVATE HEALTH INSURANCE | Admitting: Internal Medicine

## 2012-03-12 ENCOUNTER — Encounter: Payer: Self-pay | Admitting: Internal Medicine

## 2012-03-12 VITALS — BP 118/70 | HR 87 | Temp 97.3°F | Resp 16 | Wt 208.8 lb

## 2012-03-12 DIAGNOSIS — J189 Pneumonia, unspecified organism: Secondary | ICD-10-CM

## 2012-03-12 DIAGNOSIS — E119 Type 2 diabetes mellitus without complications: Secondary | ICD-10-CM

## 2012-03-12 DIAGNOSIS — R0789 Other chest pain: Secondary | ICD-10-CM | POA: Insufficient documentation

## 2012-03-12 DIAGNOSIS — J449 Chronic obstructive pulmonary disease, unspecified: Secondary | ICD-10-CM

## 2012-03-12 MED ORDER — OXYCODONE-ACETAMINOPHEN 5-325 MG PO TABS: 1.0000 | ORAL_TABLET | Freq: Three times a day (TID) | ORAL | Status: AC | PRN

## 2012-03-12 MED ORDER — METHYLPREDNISOLONE ACETATE 80 MG/ML IJ SUSP
120.0000 mg | Freq: Once | INTRAMUSCULAR | Status: AC
  Administered 2012-03-12: 120 mg via INTRAMUSCULAR

## 2012-03-12 MED ORDER — MOXIFLOXACIN HCL 400 MG PO TABS: 400.0000 mg | ORAL_TABLET | Freq: Every day | ORAL | Status: DC

## 2012-03-12 MED ORDER — FLUTICASONE-SALMETEROL 100-50 MCG/DOSE IN AEPB: 1.0000 | INHALATION_SPRAY | Freq: Two times a day (BID) | RESPIRATORY_TRACT | Status: DC

## 2012-03-12 MED ORDER — DOXYCYCLINE HYCLATE 100 MG PO TABS: 100.0000 mg | ORAL_TABLET | Freq: Two times a day (BID) | ORAL | Status: AC

## 2012-03-12 NOTE — Assessment & Plan Note (Signed)
Advair x 1-2 wks

## 2012-03-12 NOTE — Assessment & Plan Note (Signed)
Prn meds 

## 2012-03-12 NOTE — Assessment & Plan Note (Signed)
Continue with current prescription therapy as reflected on the Med list.  

## 2012-03-12 NOTE — Progress Notes (Signed)
Patient ID: Amanda Arnold, female   DOB: [birth year], 66 y.o.   MRN: [id number]   Subjective:    Patient ID: Amanda Arnold, female    DOB: Sep 01, 1945, 66 y.o.   MRN: [id number]  HPI C/o LLL pneumonia dx'd in [facility] ER on 8/27 - given Zpac there - not better C/o fatigue. C/o peeing at night a lot... The patient is here to follow up on chronic HTN, depression, anxiety, headaches and chronic moderate fibromyalgia/LBP symptoms controlled with medicines, diet and exercise. She is not trying to stop smoking now.  C/o post CP on L Wt Readings from Last 3 Encounters:  03/12/12 208 lb 12 oz (94.688 kg)  12/24/11 209 lb (94.802 kg)  10/14/11 211 lb (95.709 kg)   BP Readings from Last 3 Encounters:  03/12/12 118/70  12/24/11 120/60  10/14/11 120/70      Review of Systems  Constitutional: Positive for fatigue and unexpected weight change. Negative for chills, activity change and appetite change.  HENT: Negative for congestion, mouth sores and sinus pressure.   Eyes: Negative for visual disturbance.  Respiratory: Negative for cough and chest tightness.   Gastrointestinal: Negative for nausea and abdominal pain.  Genitourinary: Negative for frequency, difficulty urinating and vaginal pain.  Musculoskeletal: Positive for back pain and arthralgias. Negative for gait problem.  Skin: Negative for pallor and rash.  Neurological: Negative for dizziness, tremors, weakness, numbness and headaches.  Psychiatric/Behavioral: Positive for disturbed wake/sleep cycle. Negative for suicidal ideas and confusion. The patient is nervous/anxious.    Wt Readings from Last 3 Encounters:  03/12/12 208 lb 12 oz (94.688 kg)  12/24/11 209 lb (94.802 kg)  10/14/11 211 lb (95.709 kg)       Objective:   Physical Exam  Constitutional: She appears well-developed. No distress.       obese  HENT:  Head: Normocephalic.  Right Ear: External ear normal.  Left Ear: External ear normal.  Nose: Nose  normal.  Mouth/Throat: Oropharynx is clear and moist.  Eyes: Conjunctivae are normal. Pupils are equal, round, and reactive to light. Right eye exhibits no discharge. Left eye exhibits no discharge.  Neck: Normal range of motion. Neck supple. No JVD present. No tracheal deviation present. No thyromegaly present.  Cardiovascular: Normal rate, regular rhythm and normal heart sounds.   Pulmonary/Chest: No stridor. No respiratory distress. She has no wheezes.  Abdominal: Soft. Bowel sounds are normal. She exhibits no distension and no mass. There is no tenderness. There is no rebound and no guarding.  Musculoskeletal: She exhibits tenderness. She exhibits no edema.  Lymphadenopathy:    She has no cervical adenopathy.  Neurological: She displays normal reflexes. No cranial nerve deficit. She exhibits normal muscle tone. Coordination normal.  Skin: No rash noted. No erythema.  Psychiatric: She has a normal mood and affect. Her behavior is normal. Judgment and thought content normal.  Acne, moles on face  Lab Results  Component Value Date   WBC 6.0 09/23/2010   HGB 13.8 09/23/2010   HCT 40.2 09/23/2010   PLT 198.0 09/23/2010   GLUCOSE 104* 12/24/2011   ALT 54* 03/07/2011   AST 50* 03/07/2011   NA 136 12/24/2011   K 4.2 12/24/2011   CL 104 12/24/2011   CREATININE 0.7 12/24/2011   BUN 17 12/24/2011   CO2 23 12/24/2011   TSH 0.78 05/15/2010   HGBA1C 5.9 12/24/2011     I personally provided the advair inhaler use teaching. After the teaching patient was able  to demonstrate it's use effectively. All questions were answered     Assessment & Plan:

## 2012-03-12 NOTE — Assessment & Plan Note (Signed)
Start Avelox CXR in 6 wks

## 2012-03-17 ENCOUNTER — Other Ambulatory Visit: Payer: Self-pay | Admitting: *Deleted

## 2012-03-17 MED ORDER — METFORMIN HCL 500 MG PO TABS: 500.0000 mg | ORAL_TABLET | Freq: Two times a day (BID) | ORAL | Status: DC

## 2012-03-29 ENCOUNTER — Ambulatory Visit: Payer: PRIVATE HEALTH INSURANCE | Admitting: Internal Medicine

## 2012-04-26 ENCOUNTER — Telehealth: Payer: Self-pay | Admitting: Internal Medicine

## 2012-04-26 MED ORDER — METFORMIN HCL 500 MG PO TABS: 500.0000 mg | ORAL_TABLET | Freq: Two times a day (BID) | ORAL | Status: DC

## 2012-04-26 NOTE — Telephone Encounter (Signed)
Patient needs refill on Metformin to [facility] on [address]

## 2012-05-10 ENCOUNTER — Other Ambulatory Visit (INDEPENDENT_AMBULATORY_CARE_PROVIDER_SITE_OTHER): Payer: 59

## 2012-05-10 ENCOUNTER — Ambulatory Visit (INDEPENDENT_AMBULATORY_CARE_PROVIDER_SITE_OTHER): Payer: 59 | Admitting: Internal Medicine

## 2012-05-10 ENCOUNTER — Encounter: Payer: Self-pay | Admitting: Internal Medicine

## 2012-05-10 VITALS — BP 120/70 | HR 72 | Temp 97.3°F | Resp 16 | Wt 204.0 lb

## 2012-05-10 DIAGNOSIS — E669 Obesity, unspecified: Secondary | ICD-10-CM

## 2012-05-10 DIAGNOSIS — L304 Erythema intertrigo: Secondary | ICD-10-CM

## 2012-05-10 DIAGNOSIS — F411 Generalized anxiety disorder: Secondary | ICD-10-CM

## 2012-05-10 DIAGNOSIS — IMO0001 Reserved for inherently not codable concepts without codable children: Secondary | ICD-10-CM

## 2012-05-10 DIAGNOSIS — F450 Somatization disorder: Secondary | ICD-10-CM

## 2012-05-10 DIAGNOSIS — L538 Other specified erythematous conditions: Secondary | ICD-10-CM

## 2012-05-10 LAB — URINALYSIS, ROUTINE W REFLEX MICROSCOPIC
Bilirubin Urine: NEGATIVE
Hgb urine dipstick: NEGATIVE
Ketones, ur: NEGATIVE
Nitrite: NEGATIVE
Specific Gravity, Urine: >=1.03 (ref 1.000–1.030)
Total Protein, Urine: NEGATIVE
Urine Glucose: NEGATIVE
Urobilinogen, UA: 0.2 (ref 0.0–1.0)
pH: 6 (ref 5.0–8.0)

## 2012-05-10 MED ORDER — ALPRAZOLAM 0.5 MG PO TABS: 0.2500 mg | ORAL_TABLET | Freq: Two times a day (BID) | ORAL | Status: DC

## 2012-05-10 MED ORDER — HYDROCODONE-ACETAMINOPHEN 7.5-325 MG PO TABS: 1.0000 | ORAL_TABLET | Freq: Three times a day (TID) | ORAL | Status: DC | PRN

## 2012-05-10 NOTE — Assessment & Plan Note (Signed)
Continue with current prescription therapy as reflected on the Med list.  

## 2012-05-10 NOTE — Assessment & Plan Note (Signed)
See rx. 

## 2012-05-10 NOTE — Assessment & Plan Note (Signed)
Wt Readings from Last 3 Encounters:  05/10/12 204 lb (92.534 kg)  03/12/12 208 lb 12 oz (94.688 kg)  12/24/11 209 lb (94.802 kg)

## 2012-05-10 NOTE — Progress Notes (Signed)
   Subjective:    Patient ID: Amanda Arnold, female    DOB: 1946-06-20, 66 y.o.   MRN: [id number]  HPI . The patient is here to follow up on chronic HTN, depression, anxiety, headaches and chronic moderate fibromyalgia/LBP symptoms controlled with medicines, diet and exercise. She is not trying to stop smoking now.  C/o rash in the skin folds  Wt Readings from Last 3 Encounters:  05/10/12 204 lb (92.534 kg)  03/12/12 208 lb 12 oz (94.688 kg)  12/24/11 209 lb (94.802 kg)   BP Readings from Last 3 Encounters:  05/10/12 120/70  03/12/12 118/70  12/24/11 120/60      Review of Systems  Constitutional: Positive for fatigue and unexpected weight change. Negative for chills, activity change and appetite change.  HENT: Negative for congestion, mouth sores and sinus pressure.   Eyes: Negative for visual disturbance.  Respiratory: Negative for cough and chest tightness.   Gastrointestinal: Negative for nausea and abdominal pain.  Genitourinary: Negative for frequency, difficulty urinating and vaginal pain.  Musculoskeletal: Positive for back pain and arthralgias. Negative for gait problem.  Skin: Negative for pallor and rash.  Neurological: Negative for dizziness, tremors, weakness, numbness and headaches.  Psychiatric/Behavioral: Positive for disturbed wake/sleep cycle. Negative for suicidal ideas and confusion. The patient is nervous/anxious.    Wt Readings from Last 3 Encounters:  05/10/12 204 lb (92.534 kg)  03/12/12 208 lb 12 oz (94.688 kg)  12/24/11 209 lb (94.802 kg)       Objective:   Physical Exam  Constitutional: She appears well-developed. No distress.       obese  HENT:  Head: Normocephalic.  Right Ear: External ear normal.  Left Ear: External ear normal.  Nose: Nose normal.  Mouth/Throat: Oropharynx is clear and moist.  Eyes: Conjunctivae normal are normal. Pupils are equal, round, and reactive to light. Right eye exhibits no discharge. Left eye exhibits no  discharge.  Neck: Normal range of motion. Neck supple. No JVD present. No tracheal deviation present. No thyromegaly present.  Cardiovascular: Normal rate, regular rhythm and normal heart sounds.   Pulmonary/Chest: No stridor. No respiratory distress. She has no wheezes.  Abdominal: Soft. Bowel sounds are normal. She exhibits no distension and no mass. There is no tenderness. There is no rebound and no guarding.  Musculoskeletal: She exhibits tenderness. She exhibits no edema.  Lymphadenopathy:    She has no cervical adenopathy.  Neurological: She displays normal reflexes. No cranial nerve deficit. She exhibits normal muscle tone. Coordination normal.  Skin: No rash noted. No erythema.  Psychiatric: She has a normal mood and affect. Her behavior is normal. Judgment and thought content normal.  Acne, moles on face Intertrigo rash  Lab Results  Component Value Date   WBC 6.0 09/23/2010   HGB 13.8 09/23/2010   HCT 40.2 09/23/2010   PLT 198.0 09/23/2010   GLUCOSE 104* 12/24/2011   ALT 54* 03/07/2011   AST 50* 03/07/2011   NA 136 12/24/2011   K 4.2 12/24/2011   CL 104 12/24/2011   CREATININE 0.7 12/24/2011   BUN 17 12/24/2011   CO2 23 12/24/2011   TSH 0.78 05/15/2010   HGBA1C 5.9 12/24/2011     I personally provided the advair inhaler use teaching. After the teaching patient was able to demonstrate it's use effectively. All questions were answered     Assessment & Plan:

## 2012-05-20 ENCOUNTER — Ambulatory Visit (INDEPENDENT_AMBULATORY_CARE_PROVIDER_SITE_OTHER)
Admission: RE | Admit: 2012-05-20 | Discharge: 2012-05-20 | Disposition: A | Discharge status: Home / Self Care | Payer: 59 | Source: Ambulatory Visit | Attending: Internal Medicine | Admitting: Internal Medicine

## 2012-05-20 ENCOUNTER — Encounter: Payer: Self-pay | Admitting: Internal Medicine

## 2012-05-20 ENCOUNTER — Ambulatory Visit (INDEPENDENT_AMBULATORY_CARE_PROVIDER_SITE_OTHER): Payer: 59 | Admitting: Internal Medicine

## 2012-05-20 VITALS — BP 128/76 | HR 76 | Temp 98.1°F | Resp 16 | Wt 204.0 lb

## 2012-05-20 DIAGNOSIS — R071 Chest pain on breathing: Secondary | ICD-10-CM

## 2012-05-20 DIAGNOSIS — B372 Candidiasis of skin and nail: Secondary | ICD-10-CM

## 2012-05-20 DIAGNOSIS — M545 Low back pain, unspecified: Secondary | ICD-10-CM

## 2012-05-20 DIAGNOSIS — F17200 Nicotine dependence, unspecified, uncomplicated: Secondary | ICD-10-CM

## 2012-05-20 DIAGNOSIS — I10 Essential (primary) hypertension: Secondary | ICD-10-CM

## 2012-05-20 DIAGNOSIS — IMO0001 Reserved for inherently not codable concepts without codable children: Secondary | ICD-10-CM

## 2012-05-20 DIAGNOSIS — R0789 Other chest pain: Secondary | ICD-10-CM | POA: Insufficient documentation

## 2012-05-20 DIAGNOSIS — E119 Type 2 diabetes mellitus without complications: Secondary | ICD-10-CM

## 2012-05-20 MED ORDER — DOXYCYCLINE HYCLATE 100 MG PO TABS: 100.0000 mg | ORAL_TABLET | Freq: Two times a day (BID) | ORAL | Status: DC

## 2012-05-20 MED ORDER — FLUCONAZOLE 100 MG PO TABS: ORAL_TABLET | ORAL | Status: DC

## 2012-05-20 NOTE — Assessment & Plan Note (Signed)
Continue with current prescription therapy as reflected on the Med list.  

## 2012-05-20 NOTE — Progress Notes (Signed)
Patient ID: Amanda Arnold, female   DOB: 1945/11/10, 66 y.o.   MRN: [id number]   Subjective:    Patient ID: Amanda Arnold, female    DOB: 05-18-1946, 66 y.o.   MRN: [id number]  Back Pain This is a recurrent problem. The current episode started in the past 7 days. The problem has been gradually worsening since onset. Pain location: lower thor region and R post CP - shoulder blade. The quality of the pain is described as aching. The pain is at a severity of 6/10. The pain is moderate. Pertinent negatives include no abdominal pain, fever, headaches, numbness or weakness. The treatment provided mild relief.  Rash This is a recurrent problem. The current episode started more than 1 month ago. The affected locations include the torso. The rash is characterized by itchiness. Associated symptoms include fatigue. Pertinent negatives include no congestion, cough or fever. The treatment provided mild relief. (Intertrigo)   . The patient is here to follow up on chronic HTN, depression, anxiety, headaches and chronic moderate fibromyalgia/LBP symptoms controlled with medicines, diet and exercise. She is not trying to stop smoking now.  C/o rash in the skin folds  Wt Readings from Last 3 Encounters:  05/20/12 204 lb (92.534 kg)  05/10/12 204 lb (92.534 kg)  03/12/12 208 lb 12 oz (94.688 kg)   BP Readings from Last 3 Encounters:  05/20/12 128/76  05/10/12 120/70  03/12/12 118/70      Review of Systems  Constitutional: Positive for fatigue and unexpected weight change. Negative for fever, chills, activity change and appetite change.  HENT: Negative for congestion, mouth sores and sinus pressure.   Eyes: Negative for visual disturbance.  Respiratory: Negative for cough and chest tightness.   Gastrointestinal: Negative for nausea and abdominal pain.  Genitourinary: Negative for frequency, difficulty urinating and vaginal pain.  Musculoskeletal: Positive for back pain and arthralgias. Negative for  gait problem.  Skin: Positive for rash. Negative for pallor.  Neurological: Negative for dizziness, tremors, weakness, numbness and headaches.  Psychiatric/Behavioral: Positive for sleep disturbance. Negative for suicidal ideas and confusion. The patient is nervous/anxious.    Wt Readings from Last 3 Encounters:  05/20/12 204 lb (92.534 kg)  05/10/12 204 lb (92.534 kg)  03/12/12 208 lb 12 oz (94.688 kg)       Objective:   Physical Exam  Constitutional: She appears well-developed. No distress.       obese  HENT:  Head: Normocephalic.  Right Ear: External ear normal.  Left Ear: External ear normal.  Nose: Nose normal.  Mouth/Throat: Oropharynx is clear and moist.  Eyes: Conjunctivae normal are normal. Pupils are equal, round, and reactive to light. Right eye exhibits no discharge. Left eye exhibits no discharge.  Neck: Normal range of motion. Neck supple. No JVD present. No tracheal deviation present. No thyromegaly present.  Cardiovascular: Normal rate, regular rhythm and normal heart sounds.   Pulmonary/Chest: No stridor. No respiratory distress. She has no wheezes.  Abdominal: Soft. Bowel sounds are normal. She exhibits no distension and no mass. There is no tenderness. There is no rebound and no guarding.  Musculoskeletal: She exhibits tenderness. She exhibits no edema.  Lymphadenopathy:    She has no cervical adenopathy.  Neurological: She displays normal reflexes. No cranial nerve deficit. She exhibits normal muscle tone. Coordination normal.  Skin: No rash noted. No erythema.  Psychiatric: She has a normal mood and affect. Her behavior is normal. Judgment and thought content normal.  Acne, moles on face Intertrigo  rash  Lab Results  Component Value Date   WBC 6.0 09/23/2010   HGB 13.8 09/23/2010   HCT 40.2 09/23/2010   PLT 198.0 09/23/2010   GLUCOSE 104* 12/24/2011   ALT 54* 03/07/2011   AST 50* 03/07/2011   NA 136 12/24/2011   K 4.2 12/24/2011   CL 104 12/24/2011    CREATININE 0.7 12/24/2011   BUN 17 12/24/2011   CO2 23 12/24/2011   TSH 0.78 05/15/2010   HGBA1C 5.9 12/24/2011     I personally provided the advair inhaler use teaching. After the teaching patient was able to demonstrate it's use effectively. All questions were answered     Assessment & Plan:

## 2012-05-20 NOTE — Assessment & Plan Note (Signed)
B breasts - refractory Add po Diflucan

## 2012-05-20 NOTE — Assessment & Plan Note (Signed)
Discussed.

## 2012-05-20 NOTE — Assessment & Plan Note (Signed)
See meds CXR

## 2012-05-21 ENCOUNTER — Other Ambulatory Visit: Payer: Self-pay | Admitting: Endocrinology

## 2012-08-12 ENCOUNTER — Encounter: Payer: Self-pay | Admitting: Internal Medicine

## 2012-08-12 ENCOUNTER — Ambulatory Visit (INDEPENDENT_AMBULATORY_CARE_PROVIDER_SITE_OTHER): Payer: 59 | Admitting: Internal Medicine

## 2012-08-12 VITALS — BP 122/76 | HR 76 | Temp 97.3°F | Resp 16 | Wt 201.0 lb

## 2012-08-12 DIAGNOSIS — B372 Candidiasis of skin and nail: Secondary | ICD-10-CM

## 2012-08-12 DIAGNOSIS — E669 Obesity, unspecified: Secondary | ICD-10-CM

## 2012-08-12 DIAGNOSIS — IMO0001 Reserved for inherently not codable concepts without codable children: Secondary | ICD-10-CM

## 2012-08-12 DIAGNOSIS — E119 Type 2 diabetes mellitus without complications: Secondary | ICD-10-CM

## 2012-08-12 DIAGNOSIS — M545 Low back pain, unspecified: Secondary | ICD-10-CM

## 2012-08-12 DIAGNOSIS — R7309 Other abnormal glucose: Secondary | ICD-10-CM

## 2012-08-12 MED ORDER — METOPROLOL SUCCINATE ER 25 MG PO TB24: 25.0000 mg | ORAL_TABLET | Freq: Every day | ORAL | Status: DC

## 2012-08-12 MED ORDER — KETOCONAZOLE 200 MG PO TABS: 200.0000 mg | ORAL_TABLET | Freq: Every day | ORAL | Status: DC

## 2012-08-12 MED ORDER — GABAPENTIN 300 MG PO CAPS: 300.0000 mg | ORAL_CAPSULE | Freq: Three times a day (TID) | ORAL | Status: DC

## 2012-08-12 MED ORDER — HYDROCODONE-ACETAMINOPHEN 7.5-325 MG PO TABS: 1.0000 | ORAL_TABLET | Freq: Three times a day (TID) | ORAL | Status: DC | PRN

## 2012-08-12 NOTE — Assessment & Plan Note (Signed)
Ketoconazole 200 mg po qd x 1 mo Antifungal powder qd Re-start Metformin

## 2012-08-12 NOTE — Assessment & Plan Note (Signed)
Continue with current prescription therapy as reflected on the Med list.  

## 2012-08-12 NOTE — Progress Notes (Signed)
Subjective:    Back Pain This is a recurrent problem. The current episode started more than 1 month ago. The problem occurs intermittently. The problem has been gradually worsening since onset. The pain is present in the thoracic spine (lower thor region and R post CP - shoulder blade). The quality of the pain is described as aching. The pain is at a severity of 6/10. The pain is moderate. Pertinent negatives include no abdominal pain, fever, headaches, numbness or weakness. The treatment provided mild relief.  Rash This is a recurrent problem. The current episode started more than 1 month ago. The affected locations include the torso. The rash is characterized by itchiness. Associated symptoms include fatigue. Pertinent negatives include no congestion, cough or fever. The treatment provided moderate relief. (Intertrigo)   . The patient is here to follow up on chronic HTN, depression, anxiety, headaches and chronic moderate fibromyalgia/LBP symptoms controlled with medicines, diet and exercise. She is not trying to stop smoking now.  C/o rash in the skin folds  Wt Readings from Last 3 Encounters:  08/12/12 201 lb (91.173 kg)  05/20/12 204 lb (92.534 kg)  05/10/12 204 lb (92.534 kg)   BP Readings from Last 3 Encounters:  08/12/12 122/76  05/20/12 128/76  05/10/12 120/70      Review of Systems  Constitutional: Positive for fatigue and unexpected weight change. Negative for fever, chills, activity change and appetite change.  HENT: Negative for congestion, mouth sores and sinus pressure.   Eyes: Negative for visual disturbance.  Respiratory: Negative for cough and chest tightness.   Gastrointestinal: Negative for nausea and abdominal pain.  Genitourinary: Negative for frequency, difficulty urinating and vaginal pain.  Musculoskeletal: Positive for back pain and arthralgias. Negative for gait problem.  Skin: Positive for rash. Negative for pallor.  Neurological: Negative for  dizziness, tremors, weakness, numbness and headaches.  Psychiatric/Behavioral: Positive for sleep disturbance. Negative for suicidal ideas and confusion. The patient is nervous/anxious.    Wt Readings from Last 3 Encounters:  08/12/12 201 lb (91.173 kg)  05/20/12 204 lb (92.534 kg)  05/10/12 204 lb (92.534 kg)       Objective:   Physical Exam  Constitutional: She appears well-developed. No distress.       obese  HENT:  Head: Normocephalic.  Right Ear: External ear normal.  Left Ear: External ear normal.  Nose: Nose normal.  Mouth/Throat: Oropharynx is clear and moist.  Eyes: Conjunctivae normal are normal. Pupils are equal, round, and reactive to light. Right eye exhibits no discharge. Left eye exhibits no discharge.  Neck: Normal range of motion. Neck supple. No JVD present. No tracheal deviation present. No thyromegaly present.  Cardiovascular: Normal rate, regular rhythm and normal heart sounds.   Pulmonary/Chest: No stridor. No respiratory distress. She has no wheezes.  Abdominal: Soft. Bowel sounds are normal. She exhibits no distension and no mass. There is no tenderness. There is no rebound and no guarding.  Musculoskeletal: She exhibits tenderness. She exhibits no edema.  Lymphadenopathy:    She has no cervical adenopathy.  Neurological: She displays normal reflexes. No cranial nerve deficit. She exhibits normal muscle tone. Coordination normal.  Skin: No rash noted. No erythema.  Psychiatric: She has a normal mood and affect. Her behavior is normal. Judgment and thought content normal.  Acne, moles on face Intertrigo rash  Lab Results  Component Value Date   WBC 6.0 09/23/2010   HGB 13.8 09/23/2010   HCT 40.2 09/23/2010   PLT 198.0 09/23/2010  GLUCOSE 104* 12/24/2011   ALT 54* 03/07/2011   AST 50* 03/07/2011   NA 136 12/24/2011   K 4.2 12/24/2011   CL 104 12/24/2011   CREATININE 0.7 12/24/2011   BUN 17 12/24/2011   CO2 23 12/24/2011   TSH 0.78 05/15/2010   HGBA1C 5.9  12/24/2011     I personally provided the advair inhaler use teaching. After the teaching patient was able to demonstrate it's use effectively. All questions were answered     Assessment & Plan:

## 2012-08-12 NOTE — Assessment & Plan Note (Signed)
Restart Metformin 

## 2012-08-12 NOTE — Assessment & Plan Note (Signed)
Wt Readings from Last 3 Encounters:  08/12/12 201 lb (91.173 kg)  05/20/12 204 lb (92.534 kg)  05/10/12 204 lb (92.534 kg)

## 2012-08-12 NOTE — Assessment & Plan Note (Signed)
Increased Gabapentin to 300 mg Stretch!

## 2012-08-25 ENCOUNTER — Encounter: Payer: Self-pay | Admitting: Internal Medicine

## 2012-08-25 ENCOUNTER — Ambulatory Visit (INDEPENDENT_AMBULATORY_CARE_PROVIDER_SITE_OTHER): Payer: Medicaid Other | Admitting: Internal Medicine

## 2012-08-25 VITALS — BP 148/78 | HR 72 | Temp 99.4°F | Resp 16 | Wt 205.0 lb

## 2012-08-25 DIAGNOSIS — I10 Essential (primary) hypertension: Secondary | ICD-10-CM

## 2012-08-25 DIAGNOSIS — H6120 Impacted cerumen, unspecified ear: Secondary | ICD-10-CM

## 2012-08-25 DIAGNOSIS — J329 Chronic sinusitis, unspecified: Secondary | ICD-10-CM

## 2012-08-25 DIAGNOSIS — IMO0001 Reserved for inherently not codable concepts without codable children: Secondary | ICD-10-CM

## 2012-08-25 DIAGNOSIS — F411 Generalized anxiety disorder: Secondary | ICD-10-CM

## 2012-08-25 DIAGNOSIS — E119 Type 2 diabetes mellitus without complications: Secondary | ICD-10-CM

## 2012-08-25 MED ORDER — AMOXICILLIN 500 MG PO CAPS: 1000.0000 mg | ORAL_CAPSULE | Freq: Two times a day (BID) | ORAL | Status: DC

## 2012-08-25 MED ORDER — METOPROLOL SUCCINATE ER 25 MG PO TB24: 25.0000 mg | ORAL_TABLET | Freq: Two times a day (BID) | ORAL | Status: DC

## 2012-08-25 NOTE — Assessment & Plan Note (Signed)
RTC for irrigation when feeling better

## 2012-08-25 NOTE — Assessment & Plan Note (Signed)
Continue with current prescription therapy as reflected on the Med list.  

## 2012-08-25 NOTE — Assessment & Plan Note (Signed)
Amoxicillin x10d 

## 2012-08-25 NOTE — Assessment & Plan Note (Signed)
BP seems to be ok here Take Toprol bid if BP stays up NAS diet

## 2012-08-25 NOTE — Progress Notes (Signed)
Subjective:    Hypertension This is a chronic problem. The current episode started more than 1 year ago. The problem has been waxing and waning since onset. The problem is uncontrolled (200/100 earlier). Associated symptoms include headaches.  Headache  This is a new problem. The current episode started yesterday. The problem occurs constantly. The problem has been unchanged. The pain is located in the bilateral region. The pain radiates to the face. The pain quality is similar to prior headaches. The quality of the pain is described as aching. The pain is at a severity of 6/10. The pain is moderate. Associated symptoms include back pain and rhinorrhea. Pertinent negatives include no abdominal pain, coughing, dizziness, nausea, numbness, sinus pressure or weakness. Her past medical history is significant for hypertension.  C/o sinus d/c - bloody . The patient is here to follow up on chronic HTN, depression, anxiety, headaches and chronic moderate fibromyalgia/LBP symptoms controlled with medicines, diet and exercise. She is not trying to stop smoking now.  C/o rash in the skin folds  Wt Readings from Last 3 Encounters:  08/25/12 205 lb (92.987 kg)  08/12/12 201 lb (91.173 kg)  05/20/12 204 lb (92.534 kg)   BP Readings from Last 3 Encounters:  08/25/12 148/78  08/12/12 122/76  05/20/12 128/76      Review of Systems  Constitutional: Positive for fatigue and unexpected weight change. Negative for chills, activity change and appetite change.  HENT: Positive for rhinorrhea. Negative for congestion, mouth sores and sinus pressure.   Eyes: Negative for visual disturbance.  Respiratory: Negative for cough and chest tightness.   Gastrointestinal: Negative for nausea and abdominal pain.  Genitourinary: Negative for frequency, difficulty urinating and vaginal pain.  Musculoskeletal: Positive for back pain and arthralgias. Negative for gait problem.  Skin: Negative for pallor and rash.   Neurological: Positive for headaches. Negative for dizziness, tremors, weakness and numbness.  Psychiatric/Behavioral: Positive for sleep disturbance. Negative for suicidal ideas and confusion. The patient is nervous/anxious.         Objective:   Physical Exam  Constitutional: She appears well-developed. No distress.  obese  HENT:  Head: Normocephalic.  Right Ear: External ear normal.  Left Ear: External ear normal.  Nose: Nose normal.  Mouth/Throat: Oropharynx is clear and moist.  Eyes: Conjunctivae are normal. Pupils are equal, round, and reactive to light. Right eye exhibits no discharge. Left eye exhibits no discharge.  Neck: Normal range of motion. Neck supple. No JVD present. No tracheal deviation present. No thyromegaly present.  Cardiovascular: Normal rate, regular rhythm and normal heart sounds.   Pulmonary/Chest: No stridor. No respiratory distress. She has no wheezes.  Abdominal: Soft. Bowel sounds are normal. She exhibits no distension and no mass. There is no tenderness. There is no rebound and no guarding.  Musculoskeletal: She exhibits tenderness. She exhibits no edema.  Lymphadenopathy:    She has no cervical adenopathy.  Neurological: She displays normal reflexes. No cranial nerve deficit. She exhibits normal muscle tone. Coordination normal.  Skin: No rash noted. No erythema.  Psychiatric: She has a normal mood and affect. Her behavior is normal. Judgment and thought content normal.  Acne, moles on face Intertrigo rash Swollen nasal mucosa Lab Results  Component Value Date   WBC 6.0 09/23/2010   HGB 13.8 09/23/2010   HCT 40.2 09/23/2010   PLT 198.0 09/23/2010   GLUCOSE 104* 12/24/2011   ALT 54* 03/07/2011   AST 50* 03/07/2011   NA 136 12/24/2011   K 4.2  12/24/2011   CL 104 12/24/2011   CREATININE 0.7 12/24/2011   BUN 17 12/24/2011   CO2 23 12/24/2011   TSH 0.78 05/15/2010   HGBA1C 5.9 12/24/2011          Assessment & Plan:

## 2012-08-31 ENCOUNTER — Encounter (HOSPITAL_COMMUNITY): Payer: Self-pay | Admitting: Emergency Medicine

## 2012-08-31 ENCOUNTER — Emergency Department (HOSPITAL_COMMUNITY)
Admission: EM | Admit: 2012-08-31 | Discharge: 2012-08-31 | Disposition: A | Discharge status: Home / Self Care | Payer: 59 | Attending: Emergency Medicine | Admitting: Emergency Medicine

## 2012-08-31 DIAGNOSIS — Z8669 Personal history of other diseases of the nervous system and sense organs: Secondary | ICD-10-CM | POA: Insufficient documentation

## 2012-08-31 DIAGNOSIS — G4733 Obstructive sleep apnea (adult) (pediatric): Secondary | ICD-10-CM | POA: Insufficient documentation

## 2012-08-31 DIAGNOSIS — Z862 Personal history of diseases of the blood and blood-forming organs and certain disorders involving the immune mechanism: Secondary | ICD-10-CM | POA: Insufficient documentation

## 2012-08-31 DIAGNOSIS — Z8639 Personal history of other endocrine, nutritional and metabolic disease: Secondary | ICD-10-CM | POA: Insufficient documentation

## 2012-08-31 DIAGNOSIS — R040 Epistaxis: Secondary | ICD-10-CM | POA: Insufficient documentation

## 2012-08-31 DIAGNOSIS — Z87891 Personal history of nicotine dependence: Secondary | ICD-10-CM | POA: Insufficient documentation

## 2012-08-31 DIAGNOSIS — F411 Generalized anxiety disorder: Secondary | ICD-10-CM | POA: Insufficient documentation

## 2012-08-31 DIAGNOSIS — F3289 Other specified depressive episodes: Secondary | ICD-10-CM | POA: Insufficient documentation

## 2012-08-31 DIAGNOSIS — J449 Chronic obstructive pulmonary disease, unspecified: Secondary | ICD-10-CM | POA: Insufficient documentation

## 2012-08-31 DIAGNOSIS — Z7982 Long term (current) use of aspirin: Secondary | ICD-10-CM | POA: Insufficient documentation

## 2012-08-31 DIAGNOSIS — I10 Essential (primary) hypertension: Secondary | ICD-10-CM | POA: Insufficient documentation

## 2012-08-31 DIAGNOSIS — Z8739 Personal history of other diseases of the musculoskeletal system and connective tissue: Secondary | ICD-10-CM | POA: Insufficient documentation

## 2012-08-31 DIAGNOSIS — IMO0002 Reserved for concepts with insufficient information to code with codable children: Secondary | ICD-10-CM | POA: Insufficient documentation

## 2012-08-31 DIAGNOSIS — R51 Headache: Secondary | ICD-10-CM | POA: Insufficient documentation

## 2012-08-31 DIAGNOSIS — R112 Nausea with vomiting, unspecified: Secondary | ICD-10-CM | POA: Insufficient documentation

## 2012-08-31 DIAGNOSIS — J4489 Other specified chronic obstructive pulmonary disease: Secondary | ICD-10-CM | POA: Insufficient documentation

## 2012-08-31 DIAGNOSIS — R42 Dizziness and giddiness: Secondary | ICD-10-CM | POA: Insufficient documentation

## 2012-08-31 DIAGNOSIS — Z79899 Other long term (current) drug therapy: Secondary | ICD-10-CM | POA: Insufficient documentation

## 2012-08-31 DIAGNOSIS — F329 Major depressive disorder, single episode, unspecified: Secondary | ICD-10-CM | POA: Insufficient documentation

## 2012-08-31 NOTE — ED Notes (Signed)
Pt st's after cleaning with scrubbing bubbles she became nauseated and vomited. St's after she vomited her nose started to bleed but only bled for short period.  Pt denies any nausea or vomiting at this time.  No bleeding from nostrils noted at present

## 2012-08-31 NOTE — ED Provider Notes (Signed)
History  This chart was scribed for non-physician practitioner Jimmye Norman working with Gilda Crease, * by Toya Smothers, ED Scribe. This patient was seen in room TR06C/TR06C and the patient's care was started at 18:50.  CSN: [id number]  Arrival date & time 08/31/12  1753   First MD Initiated Contact with Patient 08/31/12 1848      Chief Complaint  Patient presents with  . Epistaxis     Patient is a 67 y.o. female presenting with nosebleeds. The history is provided by the patient. No language interpreter was used.  Epistaxis  This is a new problem. The current episode started 1 to 2 hours ago. The problem occurs constantly. The problem has been resolved. The problem is associated with an unknown factor. The bleeding has been from both nares. She has tried applying pressure for the symptoms. The treatment provided mild relief. Her past medical history does not include sinus problems or allergies.    Amanda Arnold is a 67 y.o. female being treated for sinusitis, who presents to the Emergency Department complaining of new, sudden onset, moderate episode of epistaxis with nausea, HA, and SOB after exposure to cleaning materials. Pt states that she was cleaning and began feeling nauseated, then HA and epistaxis began. No fever, chills, cough, and chest pain. Pt is a former smoker, denying alcohol and illicit drug use. Pt lists a h/o  HA    Past Medical History  Diagnosis Date  . HYPERLIPIDEMIA 04/30/2006  . ANXIETY 05/25/2006  . 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 INSOMNIA, PERSISTENT 02/22/2008  . DEPRESSION 01/02/2006  . OBSTRUCTIVE SLEEP APNEA 04/17/2010  . 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 ESOTROPIA, LEFT EYE Dec 08, 1945  . HYPERTENSION 05/25/2006  . BRONCHITIS, ACUTE 12/08/2007  . COPD 05/25/2006  . T M J 05/11/2009  . OSTEOARTHRITIS 05/25/2006  . LOW BACK PAIN 01/27/2007  . FIBROMYALGIA 04/02/2006  . Headache 12/08/2007  . HYPERGLYCEMIA 05/15/2010  . Abdominal pain,  epigastric 12/18/2009  . Lazy eye     left  . Polyuria   . Migraines   . Psychosomatic disease 2011    Past Surgical History  Procedure Laterality Date  . Cholecystectomy      Family History  Problem Relation Age of Onset  . Heart attack Father   . Hypertension Brother   . Heart disease Mother     History  Substance Use Topics  . Smoking status: Former Smoker    Quit date: 04/13/2010  . Smokeless tobacco: Not on file  . Alcohol Use: No   Review of Systems  HENT: Positive for nosebleeds.   Respiratory: Negative for chest tightness and shortness of breath.   Cardiovascular: Negative for chest pain.  Gastrointestinal: Positive for nausea and vomiting.  Neurological: Positive for dizziness and headaches.  All other systems reviewed and are negative.    Allergies  Bupropion hcl; Duloxetine; Ezetimibe; Tramadol hcl; and Trazodone hcl  Home Medications   Current Outpatient Rx  Name  Route  Sig  Dispense  Refill  . ALPRAZolam (XANAX) 0.5 MG tablet   Oral   Take 0.5 mg by mouth 2 (two) times daily.         Marland Kitchen amoxicillin (AMOXIL) 500 MG capsule   Oral   Take 2 capsules (1,000 mg total) by mouth 2 (two) times daily.   40 capsule   0   . aspirin 81 MG tablet   Oral   Take 81 mg by mouth daily.           Marland Kitchen  cholecalciferol (VITAMIN D) 1000 UNITS tablet   Oral   Take 1,000 Units by mouth daily.           . Fluticasone-Salmeterol (ADVAIR DISKUS) 100-50 MCG/DOSE AEPB   Inhalation   Inhale 1 puff into the lungs 2 (two) times daily.   1 each   3   . gabapentin (NEURONTIN) 300 MG capsule   Oral   Take 1 capsule (300 mg total) by mouth 3 (three) times daily.   90 capsule   3   . HYDROcodone-acetaminophen (NORCO) 7.5-325 MG per tablet   Oral   Take 1 tablet by mouth every 8 (eight) hours as needed for pain.   90 tablet   1   . ketoconazole (NIZORAL) 200 MG tablet   Oral   Take 1 tablet (200 mg total) by mouth daily.   30 tablet   1   . metFORMIN  (GLUCOPHAGE) 500 MG tablet   Oral   Take 500 mg by mouth 2 (two) times daily with a meal.         . metoprolol succinate (TOPROL-XL) 25 MG 24 hr tablet   Oral   Take 25 mg by mouth daily.           BP 141/62  Pulse 74  Temp(Src) 98 F (36.7 C) (Oral)  Resp 20  SpO2 96%  Physical Exam  Nursing note and vitals reviewed. Constitutional: She is oriented to person, place, and time. She appears well-developed and well-nourished. No distress.  HENT:  Head: Normocephalic and atraumatic.  Dried blood in the left nair.    Eyes: EOM are normal.  Neck: Neck supple. No tracheal deviation present.  Cardiovascular: Normal rate.   Pulmonary/Chest: Effort normal. No respiratory distress.  Musculoskeletal: Normal range of motion.  Neurological: She is alert and oriented to person, place, and time. Coordination abnormal.  Skin: Skin is warm and dry.  Psychiatric: She has a normal mood and affect. Her behavior is normal.    ED Course  Procedures DIAGNOSTIC STUDIES: Oxygen Saturation is 96% on room air, normal by my interpretation.    COORDINATION OF CARE: 18:50- Evaluated Pt. Pt is awake, alert, and without distress. Symptoms of epistaxis have subsided prior to evaluation. Pt report that nausea and chills have also subsided.   18:58- Patient informed of clinical course, understand medical decision-making process, and agree with plan.    Labs Reviewed - No data to display No results found.   No diagnosis found.  Epistaxis, one episode, controlled upon arrival in ED.  Discussed with Dr. Gilmore Laroche discharge home with return precautions and follow-up with PCP.   MDM     I personally performed the services described in this documentation, which was scribed in my presence. The recorded information has been reviewed and is accurate.       Jimmye Norman, NP 09/01/12 [phone]

## 2012-09-01 ENCOUNTER — Telehealth: Payer: Self-pay | Admitting: Internal Medicine

## 2012-09-01 NOTE — Telephone Encounter (Signed)
Agree - d/c ASA Thx

## 2012-09-01 NOTE — ED Provider Notes (Signed)
Medical screening examination/treatment/procedure(s) were performed by non-physician practitioner and as supervising physician I was immediately available for consultation/collaboration.  Jonee Lamore J. Samaira Holzworth, MD 09/01/12 1214 

## 2012-09-01 NOTE — Telephone Encounter (Signed)
Patient Information:  Caller Name: Tavi  Phone: [phone]  Patient: Amanda Arnold, Amanda Arnold  Gender: Female  DOB: Aug 12, 1945  Age: 67 Years  PCP: Plotnikov, Alex (Adults only)  Office Follow Up:  Does the office need to follow up with this patient?: Yes  Instructions For The Office: Caller needs a decision from Dr [employer] on whether to take her daily dose of 81mg  Aspirin or hold it for now.   She took the Aspirin this morning despite warning from ED to not take it.  RN Note:  Caller has not had any further bleeding.  Caller verbalized understanding of care advice.  Symptoms  Reason For Call & Symptoms: Had nosebleed yesterday 2/18.  Was vomiting when the nosebleed started.  Went to  [facility] ED with nosebleed last evening.  Patient was advised to follow up with doctor and urged not to take Aspirin.  Episode occurred after using a bleach product.  Reviewed Health History In EMR: Yes  Reviewed Medications In EMR: Yes  Reviewed Allergies In EMR: Yes  Reviewed Surgeries / Procedures: Yes  Date of Onset of Symptoms: 08/31/2012  Guideline(s) Used:  Nosebleed  Disposition Per Guideline:   Home Care  Reason For Disposition Reached:   Mild-moderate nosebleed and bleeding has stopped now  Advice Given:  Reassurance:  Nosebleeds are common.  It sounds like a routine nosebleed that we can treat at home.  You should be able to stop the bleeding if you use the correct technique.  Remember to Sit up and lean forward to keep the blood from running down the back of your throat.  Call Back If:  Lightheadedness or weakness occurs  Nosebleeds become worse  You become worse.  Prevention:  Dry air in your house or workplace can increase the chance of nosebleeds occurring. If the air is dry, use a humidifier in your bedroom to keep the nose from drying out. You can also apply petroleum jelly to the center wall (septum) inside the nose twice daily to reduce cracking and to promote healing.  Bleeding can start again if you rub your nose or blow the nose too hard. Avoid touching your nose and nose picking. Avoid blowing the nose.  Do not take aspirin or other anti-inflammatory medications (e.g., ibuprofen, Advil, Motrin, Aleve), unless you have been instructed to by your physician.

## 2012-09-09 ENCOUNTER — Encounter: Payer: Self-pay | Admitting: Internal Medicine

## 2012-09-09 ENCOUNTER — Ambulatory Visit (INDEPENDENT_AMBULATORY_CARE_PROVIDER_SITE_OTHER): Payer: 59 | Admitting: Internal Medicine

## 2012-09-09 ENCOUNTER — Ambulatory Visit (INDEPENDENT_AMBULATORY_CARE_PROVIDER_SITE_OTHER)
Admission: RE | Admit: 2012-09-09 | Discharge: 2012-09-09 | Disposition: A | Discharge status: Home / Self Care | Payer: Medicaid Other | Source: Ambulatory Visit | Attending: Internal Medicine | Admitting: Internal Medicine

## 2012-09-09 VITALS — BP 120/60 | HR 76 | Temp 97.8°F | Resp 16 | Wt 201.0 lb

## 2012-09-09 DIAGNOSIS — R1032 Left lower quadrant pain: Secondary | ICD-10-CM

## 2012-09-09 DIAGNOSIS — M545 Low back pain, unspecified: Secondary | ICD-10-CM

## 2012-09-09 DIAGNOSIS — I10 Essential (primary) hypertension: Secondary | ICD-10-CM

## 2012-09-09 DIAGNOSIS — IMO0001 Reserved for inherently not codable concepts without codable children: Secondary | ICD-10-CM

## 2012-09-09 MED ORDER — ALPRAZOLAM 0.5 MG PO TABS: 0.5000 mg | ORAL_TABLET | Freq: Two times a day (BID) | ORAL | Status: DC

## 2012-09-09 NOTE — Assessment & Plan Note (Signed)
Better Continue with current prescription therapy as reflected on the Med list.  

## 2012-09-09 NOTE — Progress Notes (Signed)
Subjective:    Leg Pain  The incident occurred 3 to 5 days ago. Incident location: at the store. The injury mechanism was a fall (fell on the right side). The pain is present in the left hip (L groin). The quality of the pain is described as aching. The pain is at a severity of 6/10. The pain is moderate. The pain has been intermittent since onset. Pertinent negatives include no inability to bear weight, muscle weakness, numbness or tingling. She reports no foreign bodies present. The symptoms are aggravated by movement. She has tried NSAIDs and acetaminophen for the symptoms. The treatment provided mild relief.  Hypertension This is a chronic problem. The current episode started more than 1 year ago. The problem has been resolved since onset. The problem is controlled (200/100 earlier). Pertinent negatives include no headaches.  Headache  This is a recurrent problem. The problem occurs intermittently. The problem has been gradually improving. The pain is located in the bilateral region. The pain radiates to the face. The pain quality is similar to prior headaches. The quality of the pain is described as aching. Associated symptoms include back pain and rhinorrhea. Pertinent negatives include no abdominal pain, coughing, dizziness, nausea, numbness, sinus pressure, tingling or weakness. Her past medical history is significant for hypertension.  C/o sinus d/c - bloody . The patient is here to follow up on chronic HTN, depression, anxiety, headaches and chronic moderate fibromyalgia/LBP symptoms controlled with medicines, diet and exercise. She is not trying to stop smoking now.  C/o rash in the skin folds  Wt Readings from Last 3 Encounters:  09/09/12 201 lb (91.173 kg)  08/25/12 205 lb (92.987 kg)  08/12/12 201 lb (91.173 kg)   BP Readings from Last 3 Encounters:  09/09/12 120/60  08/31/12 135/96  08/25/12 148/78      Review of Systems  Constitutional: Positive for fatigue and  unexpected weight change. Negative for chills, activity change and appetite change.  HENT: Positive for rhinorrhea. Negative for congestion, mouth sores and sinus pressure.   Eyes: Negative for visual disturbance.  Respiratory: Negative for cough and chest tightness.   Gastrointestinal: Negative for nausea and abdominal pain.  Genitourinary: Negative for frequency, difficulty urinating and vaginal pain.  Musculoskeletal: Positive for back pain and arthralgias. Negative for gait problem.  Skin: Negative for pallor and rash.  Neurological: Negative for dizziness, tingling, tremors, weakness, numbness and headaches.  Psychiatric/Behavioral: Positive for sleep disturbance. Negative for suicidal ideas and confusion. The patient is nervous/anxious.         Objective:   Physical Exam  Constitutional: She appears well-developed. No distress.  obese  HENT:  Head: Normocephalic.  Right Ear: External ear normal.  Left Ear: External ear normal.  Nose: Nose normal.  Mouth/Throat: Oropharynx is clear and moist.  Eyes: Conjunctivae are normal. Pupils are equal, round, and reactive to light. Right eye exhibits no discharge. Left eye exhibits no discharge.  Neck: Normal range of motion. Neck supple. No JVD present. No tracheal deviation present. No thyromegaly present.  Cardiovascular: Normal rate, regular rhythm and normal heart sounds.   Pulmonary/Chest: No stridor. No respiratory distress. She has no wheezes.  Abdominal: Soft. Bowel sounds are normal. She exhibits no distension and no mass. There is no tenderness. There is no rebound and no guarding.  Musculoskeletal: She exhibits tenderness (L groin w/palp and ROM). She exhibits no edema.  Lymphadenopathy:    She has no cervical adenopathy.  Neurological: She displays normal reflexes. No cranial nerve  deficit. She exhibits normal muscle tone. Coordination normal.  Skin: No rash noted. No erythema.  Psychiatric: She has a normal mood and affect.  Her behavior is normal. Judgment and thought content normal.  Acne, moles on face Intertrigo rash Swollen nasal mucosa Lab Results  Component Value Date   WBC 6.0 09/23/2010   HGB 13.8 09/23/2010   HCT 40.2 09/23/2010   PLT 198.0 09/23/2010   GLUCOSE 104* 12/24/2011   ALT 54* 03/07/2011   AST 50* 03/07/2011   NA 136 12/24/2011   K 4.2 12/24/2011   CL 104 12/24/2011   CREATININE 0.7 12/24/2011   BUN 17 12/24/2011   CO2 23 12/24/2011   TSH 0.78 05/15/2010   HGBA1C 5.9 12/24/2011          Assessment & Plan:

## 2012-09-09 NOTE — Assessment & Plan Note (Signed)
Continue with current prescription therapy as reflected on the Med list.  

## 2012-09-09 NOTE — Assessment & Plan Note (Signed)
L hip xray Likely a MSK strain See meds

## 2012-09-10 ENCOUNTER — Telehealth: Payer: Self-pay | Admitting: Internal Medicine

## 2012-09-10 NOTE — Telephone Encounter (Signed)
Patient requesting call back with results from yesterdays x-ray, patient is still in a lot of pain

## 2012-09-10 NOTE — Telephone Encounter (Signed)
The xray was ok Take pain meds, rest; use ice/heat Thx

## 2012-09-14 NOTE — Telephone Encounter (Signed)
Pt informed

## 2012-10-07 ENCOUNTER — Other Ambulatory Visit (INDEPENDENT_AMBULATORY_CARE_PROVIDER_SITE_OTHER): Payer: 59

## 2012-10-07 ENCOUNTER — Ambulatory Visit (INDEPENDENT_AMBULATORY_CARE_PROVIDER_SITE_OTHER): Payer: 59 | Admitting: Internal Medicine

## 2012-10-07 ENCOUNTER — Encounter: Payer: Self-pay | Admitting: Internal Medicine

## 2012-10-07 VITALS — BP 110/72 | HR 76 | Temp 97.5°F | Resp 16 | Wt 200.0 lb

## 2012-10-07 DIAGNOSIS — M545 Low back pain, unspecified: Secondary | ICD-10-CM

## 2012-10-07 DIAGNOSIS — B372 Candidiasis of skin and nail: Secondary | ICD-10-CM

## 2012-10-07 DIAGNOSIS — F17200 Nicotine dependence, unspecified, uncomplicated: Secondary | ICD-10-CM

## 2012-10-07 DIAGNOSIS — R7309 Other abnormal glucose: Secondary | ICD-10-CM

## 2012-10-07 DIAGNOSIS — E119 Type 2 diabetes mellitus without complications: Secondary | ICD-10-CM

## 2012-10-07 DIAGNOSIS — E669 Obesity, unspecified: Secondary | ICD-10-CM

## 2012-10-07 DIAGNOSIS — F411 Generalized anxiety disorder: Secondary | ICD-10-CM

## 2012-10-07 DIAGNOSIS — IMO0001 Reserved for inherently not codable concepts without codable children: Secondary | ICD-10-CM

## 2012-10-07 LAB — BASIC METABOLIC PANEL
BUN: 17 mg/dL (ref 6–23)
CO2: 27 mEq/L (ref 19–32)
Calcium: 8.8 mg/dL (ref 8.4–10.5)
Chloride: 104 mEq/L (ref 96–112)
Creatinine, Ser: 0.7 mg/dL (ref 0.4–1.2)
GFR: 89.41 mL/min (ref >60.00)
Glucose, Bld: 110 mg/dL — ABNORMAL HIGH (ref 70–99)
Potassium: 4 mEq/L (ref 3.5–5.1)
Sodium: 138 mEq/L (ref 135–145)

## 2012-10-07 LAB — HEPATIC FUNCTION PANEL
ALT: 24 U/L (ref 0–35)
AST: 26 U/L (ref 0–37)
Albumin: 4 g/dL (ref 3.5–5.2)
Alkaline Phosphatase: 78 U/L (ref 39–117)
Bilirubin, Direct: 0.1 mg/dL (ref 0.0–0.3)
Total Bilirubin: 0.6 mg/dL (ref 0.3–1.2)
Total Protein: 7 g/dL (ref 6.0–8.3)

## 2012-10-07 LAB — HEMOGLOBIN A1C: Hgb A1c MFr Bld: 5.6 % (ref 4.6–6.5)

## 2012-10-07 MED ORDER — HYDROCODONE-ACETAMINOPHEN 7.5-325 MG PO TABS: 1.0000 | ORAL_TABLET | Freq: Three times a day (TID) | ORAL | Status: DC | PRN

## 2012-10-07 NOTE — Assessment & Plan Note (Signed)
Discussed - try e-cigs

## 2012-10-07 NOTE — Assessment & Plan Note (Signed)
Diet discussed 

## 2012-10-07 NOTE — Assessment & Plan Note (Signed)
Continue with current prescription therapy as reflected on the Med list.  

## 2012-10-07 NOTE — Progress Notes (Signed)
   Subjective:    HPI The patient is here to follow up on chronic HTN- better, depression, anxiety, headaches and chronic moderate fibromyalgia/LBP symptoms controlled with medicines, diet and exercise. She is not trying to stop smoking now.  C/o rash in the skin folds better  Wt Readings from Last 3 Encounters:  10/07/12 200 lb (90.719 kg)  09/09/12 201 lb (91.173 kg)  08/25/12 205 lb (92.987 kg)   BP Readings from Last 3 Encounters:  10/07/12 110/72  09/09/12 120/60  08/31/12 135/96      Review of Systems  Constitutional: Positive for fatigue and unexpected weight change. Negative for chills, activity change and appetite change.  HENT: Negative for congestion and mouth sores.   Eyes: Negative for visual disturbance.  Respiratory: Negative for chest tightness.   Genitourinary: Negative for frequency, difficulty urinating and vaginal pain.  Musculoskeletal: Positive for arthralgias. Negative for gait problem.  Skin: Negative for pallor and rash.  Neurological: Negative for tremors.  Psychiatric/Behavioral: Positive for sleep disturbance. Negative for suicidal ideas and confusion. The patient is nervous/anxious.         Objective:   Physical Exam  Constitutional: She appears well-developed. No distress.  obese  HENT:  Head: Normocephalic.  Right Ear: External ear normal.  Left Ear: External ear normal.  Nose: Nose normal.  Mouth/Throat: Oropharynx is clear and moist.  Eyes: Conjunctivae are normal. Pupils are equal, round, and reactive to light. Right eye exhibits no discharge. Left eye exhibits no discharge.  Neck: Normal range of motion. Neck supple. No JVD present. No tracheal deviation present. No thyromegaly present.  Cardiovascular: Normal rate, regular rhythm and normal heart sounds.   Pulmonary/Chest: No stridor. No respiratory distress. She has no wheezes.  Abdominal: Soft. Bowel sounds are normal. She exhibits no distension and no mass. There is no  tenderness. There is no rebound and no guarding.  Musculoskeletal: She exhibits tenderness (L groin w/palp and ROM). She exhibits no edema.  Lymphadenopathy:    She has no cervical adenopathy.  Neurological: She displays normal reflexes. No cranial nerve deficit. She exhibits normal muscle tone. Coordination normal.  Skin: No rash noted. No erythema.  Psychiatric: She has a normal mood and affect. Her behavior is normal. Judgment and thought content normal.  Acne, moles on face Intertrigo rash - better Swollen nasal mucosa - better    Lab Results  Component Value Date   WBC 6.0 09/23/2010   HGB 13.8 09/23/2010   HCT 40.2 09/23/2010   PLT 198.0 09/23/2010   GLUCOSE 104* 12/24/2011   ALT 54* 03/07/2011   AST 50* 03/07/2011   NA 136 12/24/2011   K 4.2 12/24/2011   CL 104 12/24/2011   CREATININE 0.7 12/24/2011   BUN 17 12/24/2011   CO2 23 12/24/2011   TSH 0.78 05/15/2010   HGBA1C 5.9 12/24/2011          Assessment & Plan:

## 2012-10-07 NOTE — Assessment & Plan Note (Signed)
Better - use antiperspirant under arms and under breasts

## 2012-10-07 NOTE — Patient Instructions (Signed)
use antiperspirant under arms and under breasts

## 2012-12-01 NOTE — Progress Notes (Signed)
This encounter was created in error - please disregard.

## 2012-12-09 ENCOUNTER — Ambulatory Visit (INDEPENDENT_AMBULATORY_CARE_PROVIDER_SITE_OTHER): Payer: 59 | Admitting: Internal Medicine

## 2012-12-09 ENCOUNTER — Encounter: Payer: Self-pay | Admitting: Internal Medicine

## 2012-12-09 ENCOUNTER — Other Ambulatory Visit (INDEPENDENT_AMBULATORY_CARE_PROVIDER_SITE_OTHER): Payer: 59

## 2012-12-09 VITALS — BP 104/68 | HR 72 | Temp 98.0°F | Resp 16 | Wt 197.0 lb

## 2012-12-09 DIAGNOSIS — R3589 Other polyuria: Secondary | ICD-10-CM

## 2012-12-09 DIAGNOSIS — M79605 Pain in left leg: Secondary | ICD-10-CM | POA: Insufficient documentation

## 2012-12-09 DIAGNOSIS — M79604 Pain in right leg: Secondary | ICD-10-CM | POA: Insufficient documentation

## 2012-12-09 DIAGNOSIS — F450 Somatization disorder: Secondary | ICD-10-CM

## 2012-12-09 DIAGNOSIS — M79609 Pain in unspecified limb: Secondary | ICD-10-CM

## 2012-12-09 DIAGNOSIS — M545 Low back pain, unspecified: Secondary | ICD-10-CM

## 2012-12-09 DIAGNOSIS — IMO0001 Reserved for inherently not codable concepts without codable children: Secondary | ICD-10-CM

## 2012-12-09 DIAGNOSIS — R358 Other polyuria: Secondary | ICD-10-CM

## 2012-12-09 LAB — URINALYSIS, ROUTINE W REFLEX MICROSCOPIC
Bilirubin Urine: NEGATIVE
Hgb urine dipstick: NEGATIVE
Ketones, ur: NEGATIVE
Nitrite: NEGATIVE
Specific Gravity, Urine: >=1.03 (ref 1.000–1.030)
Total Protein, Urine: NEGATIVE
Urine Glucose: NEGATIVE
Urobilinogen, UA: 0.2 (ref 0.0–1.0)
pH: 5.5 (ref 5.0–8.0)

## 2012-12-09 MED ORDER — HYDROCODONE-ACETAMINOPHEN 7.5-325 MG PO TABS: 1.0000 | ORAL_TABLET | Freq: Three times a day (TID) | ORAL | Status: DC | PRN

## 2012-12-09 MED ORDER — FESOTERODINE FUMARATE ER 8 MG PO TB24: 8.0000 mg | ORAL_TABLET | Freq: Every day | ORAL | Status: DC

## 2012-12-09 MED ORDER — ALPRAZOLAM 0.5 MG PO TABS: 0.5000 mg | ORAL_TABLET | Freq: Two times a day (BID) | ORAL | Status: DC

## 2012-12-09 NOTE — Assessment & Plan Note (Signed)
Continue with current prescription therapy as reflected on the Med list.  

## 2012-12-09 NOTE — Assessment & Plan Note (Signed)
Labs

## 2012-12-09 NOTE — Assessment & Plan Note (Signed)
Likely due to FMS Continue with current prescription therapy as reflected on the Med list.

## 2012-12-09 NOTE — Progress Notes (Signed)
   Subjective:    HPI  The patient is here to follow up on chronic HTN- better, depression, anxiety, headaches and chronic moderate fibromyalgia/LBP symptoms. C/o B leg pain - worse. No LBP She is not trying to stop smoking now.   C/o rash in the skin folds better  Wt Readings from Last 3 Encounters:  12/09/12 197 lb (89.359 kg)  10/07/12 200 lb (90.719 kg)  09/09/12 201 lb (91.173 kg)   BP Readings from Last 3 Encounters:  12/09/12 104/68  10/07/12 110/72  09/09/12 120/60      Review of Systems  Constitutional: Positive for fatigue and unexpected weight change. Negative for chills, activity change and appetite change.  HENT: Negative for congestion and mouth sores.   Eyes: Negative for visual disturbance.  Respiratory: Negative for chest tightness.   Genitourinary: Negative for frequency, difficulty urinating and vaginal pain.  Musculoskeletal: Positive for arthralgias. Negative for gait problem.  Skin: Negative for pallor and rash.  Neurological: Negative for tremors.  Psychiatric/Behavioral: Positive for sleep disturbance. Negative for suicidal ideas and confusion. The patient is nervous/anxious.         Objective:   Physical Exam  Constitutional: She appears well-developed. No distress.  obese  HENT:  Head: Normocephalic.  Right Ear: External ear normal.  Left Ear: External ear normal.  Nose: Nose normal.  Mouth/Throat: Oropharynx is clear and moist.  Eyes: Conjunctivae are normal. Pupils are equal, round, and reactive to light. Right eye exhibits no discharge. Left eye exhibits no discharge.  Neck: Normal range of motion. Neck supple. No JVD present. No tracheal deviation present. No thyromegaly present.  Cardiovascular: Normal rate, regular rhythm and normal heart sounds.   Pulmonary/Chest: No stridor. No respiratory distress. She has no wheezes.  Abdominal: Soft. Bowel sounds are normal. She exhibits no distension and no mass. There is no tenderness. There  is no rebound and no guarding.  Musculoskeletal: She exhibits tenderness (L groin w/palp and ROM). She exhibits no edema.  Lymphadenopathy:    She has no cervical adenopathy.  Neurological: She displays normal reflexes. No cranial nerve deficit. She exhibits normal muscle tone. Coordination normal.  Skin: No rash noted. No erythema.  Psychiatric: She has a normal mood and affect. Her behavior is normal. Judgment and thought content normal.  Acne, moles on face Intertrigo rash - better Swollen nasal mucosa - better    Lab Results  Component Value Date   WBC 6.0 09/23/2010   HGB 13.8 09/23/2010   HCT 40.2 09/23/2010   PLT 198.0 09/23/2010   GLUCOSE 110* 10/07/2012   ALT 24 10/07/2012   AST 26 10/07/2012   NA 138 10/07/2012   K 4.0 10/07/2012   CL 104 10/07/2012   CREATININE 0.7 10/07/2012   BUN 17 10/07/2012   CO2 27 10/07/2012   TSH 0.78 05/15/2010   HGBA1C 5.6 10/07/2012          Assessment & Plan:

## 2012-12-09 NOTE — Assessment & Plan Note (Signed)
Better Continue with current prescription therapy as reflected on the Med list.  

## 2013-02-02 ENCOUNTER — Encounter: Payer: Self-pay | Admitting: Internal Medicine

## 2013-02-02 ENCOUNTER — Ambulatory Visit (INDEPENDENT_AMBULATORY_CARE_PROVIDER_SITE_OTHER): Payer: 59 | Admitting: Internal Medicine

## 2013-02-02 VITALS — BP 130/72 | HR 68 | Temp 98.2°F | Resp 16 | Wt 201.0 lb

## 2013-02-02 DIAGNOSIS — L03119 Cellulitis of unspecified part of limb: Secondary | ICD-10-CM

## 2013-02-02 DIAGNOSIS — R609 Edema, unspecified: Secondary | ICD-10-CM

## 2013-02-02 DIAGNOSIS — L02419 Cutaneous abscess of limb, unspecified: Secondary | ICD-10-CM

## 2013-02-02 DIAGNOSIS — L02416 Cutaneous abscess of left lower limb: Secondary | ICD-10-CM | POA: Insufficient documentation

## 2013-02-02 DIAGNOSIS — IMO0001 Reserved for inherently not codable concepts without codable children: Secondary | ICD-10-CM

## 2013-02-02 DIAGNOSIS — E119 Type 2 diabetes mellitus without complications: Secondary | ICD-10-CM

## 2013-02-02 MED ORDER — METFORMIN HCL ER 500 MG PO TB24: 500.0000 mg | ORAL_TABLET | Freq: Every day | ORAL | Status: DC

## 2013-02-02 NOTE — Assessment & Plan Note (Addendum)
7/14 L inner thigh - infected seb cyst It is better on Erythromycin. I suggested I&D. Shareen refused I&D. I asked the pt to RTC tomorrow

## 2013-02-02 NOTE — Assessment & Plan Note (Addendum)
Re-start Metformin XR

## 2013-02-02 NOTE — Assessment & Plan Note (Signed)
Continue with current prescription therapy as reflected on the Med list.  

## 2013-02-02 NOTE — Assessment & Plan Note (Signed)
Better  

## 2013-02-02 NOTE — Progress Notes (Signed)
Patient ID: Amanda Arnold, female   DOB: 1945-11-06, 67 y.o.   MRN: [id number]   Subjective:    HPI  C/o a bug bite L inner thigh - it got red and swollen. He went to [facility] on Mon and got Erythro and Naproxen - better  The patient is here to follow up on chronic HTN- better, depression, anxiety, headaches and chronic moderate fibromyalgia/LBP symptoms. C/o B leg pain - worse. No LBP She is not trying to stop smoking now.   F/u rash in the skin folds better  Wt Readings from Last 3 Encounters:  02/02/13 201 lb (91.173 kg)  12/09/12 197 lb (89.359 kg)  10/07/12 200 lb (90.719 kg)   BP Readings from Last 3 Encounters:  02/02/13 130/72  12/09/12 104/68  10/07/12 110/72      Review of Systems  Constitutional: Positive for fatigue and unexpected weight change. Negative for chills, activity change and appetite change.  HENT: Negative for congestion and mouth sores.   Eyes: Negative for visual disturbance.  Respiratory: Negative for chest tightness.   Genitourinary: Negative for frequency, difficulty urinating and vaginal pain.  Musculoskeletal: Positive for arthralgias. Negative for gait problem.  Skin: Negative for pallor and rash.  Neurological: Negative for tremors.  Psychiatric/Behavioral: Positive for sleep disturbance. Negative for suicidal ideas and confusion. The patient is nervous/anxious.         Objective:   Physical Exam  Constitutional: She appears well-developed. No distress.  obese  HENT:  Head: Normocephalic.  Right Ear: External ear normal.  Left Ear: External ear normal.  Nose: Nose normal.  Mouth/Throat: Oropharynx is clear and moist.  Eyes: Conjunctivae are normal. Pupils are equal, round, and reactive to light. Right eye exhibits no discharge. Left eye exhibits no discharge.  Neck: Normal range of motion. Neck supple. No JVD present. No tracheal deviation present. No thyromegaly present.  Cardiovascular: Normal rate, regular rhythm and normal heart  sounds.   Pulmonary/Chest: No stridor. No respiratory distress. She has no wheezes.  Abdominal: Soft. Bowel sounds are normal. She exhibits no distension and no mass. There is no tenderness. There is no rebound and no guarding.  Musculoskeletal: She exhibits tenderness (L groin w/palp and ROM). She exhibits no edema.  Lymphadenopathy:    She has no cervical adenopathy.  Neurological: She displays normal reflexes. No cranial nerve deficit. She exhibits normal muscle tone. Coordination normal.  Skin: No rash noted. No erythema.  Psychiatric: She has a normal mood and affect. Her behavior is normal. Judgment and thought content normal.  Acne, moles on face Intertrigo rash - better  L inner mid thigh with 11 mm abscess w/a dark 1 mm core in the center and 1-2 cm erythema    Lab Results  Component Value Date   WBC 6.0 09/23/2010   HGB 13.8 09/23/2010   HCT 40.2 09/23/2010   PLT 198.0 09/23/2010   GLUCOSE 110* 10/07/2012   ALT 24 10/07/2012   AST 26 10/07/2012   NA 138 10/07/2012   K 4.0 10/07/2012   CL 104 10/07/2012   CREATININE 0.7 10/07/2012   BUN 17 10/07/2012   CO2 27 10/07/2012   TSH 0.78 05/15/2010   HGBA1C 5.6 10/07/2012          Assessment & Plan:

## 2013-02-08 ENCOUNTER — Encounter: Payer: Self-pay | Admitting: Internal Medicine

## 2013-02-08 ENCOUNTER — Ambulatory Visit (INDEPENDENT_AMBULATORY_CARE_PROVIDER_SITE_OTHER): Payer: 59 | Admitting: Internal Medicine

## 2013-02-08 VITALS — BP 140/80 | HR 80 | Temp 98.1°F | Resp 16 | Wt 203.0 lb

## 2013-02-08 DIAGNOSIS — E119 Type 2 diabetes mellitus without complications: Secondary | ICD-10-CM

## 2013-02-08 DIAGNOSIS — L02419 Cutaneous abscess of limb, unspecified: Secondary | ICD-10-CM

## 2013-02-08 DIAGNOSIS — L03119 Cellulitis of unspecified part of limb: Secondary | ICD-10-CM

## 2013-02-08 DIAGNOSIS — L02416 Cutaneous abscess of left lower limb: Secondary | ICD-10-CM

## 2013-02-08 MED ORDER — DOXYCYCLINE HYCLATE 100 MG PO TABS: 100.0000 mg | ORAL_TABLET | Freq: Two times a day (BID) | ORAL | Status: DC

## 2013-02-08 NOTE — Patient Instructions (Addendum)
       Wound instructions : change dressing once a day or twice a day is needed. Change dressing after  shower in the morning.  Pat dry the wound with gauze.  Re-dress wound with antibiotic ointment and Telfa pad or a Band-Aid of appropriate size.   Please contact us if you notice a recollection of pus in the abscess fever and chills increased pain redness red streaks near the abscess increased swelling in the area.

## 2013-02-08 NOTE — Progress Notes (Signed)
   Subjective:    HPI  C/o a bug bite L inner thigh - it got red and swollen. He went to [facility] on Mon and got Erythro and Naproxen - better now, draining and crusting...  The patient is here to follow up on chronic HTN- better, depression, anxiety, headaches and chronic moderate fibromyalgia/LBP symptoms.  She started Glucophage XR - diarrhea again  Wt Readings from Last 3 Encounters:  02/08/13 203 lb (92.08 kg)  02/02/13 201 lb (91.173 kg)  12/09/12 197 lb (89.359 kg)   BP Readings from Last 3 Encounters:  02/08/13 140/80  02/02/13 130/72  12/09/12 104/68      Review of Systems  Constitutional: Positive for fatigue and unexpected weight change. Negative for chills, activity change and appetite change.  HENT: Negative for congestion and mouth sores.   Eyes: Negative for visual disturbance.  Respiratory: Negative for chest tightness.   Genitourinary: Negative for frequency, difficulty urinating and vaginal pain.  Musculoskeletal: Positive for arthralgias. Negative for gait problem.  Skin: Negative for pallor and rash.  Neurological: Negative for tremors.  Psychiatric/Behavioral: Positive for sleep disturbance. Negative for suicidal ideas and confusion. The patient is nervous/anxious.         Objective:   Physical Exam  Constitutional: She appears well-developed. No distress.  obese  HENT:  Head: Normocephalic.  Right Ear: External ear normal.  Left Ear: External ear normal.  Nose: Nose normal.  Mouth/Throat: Oropharynx is clear and moist.  Eyes: Conjunctivae are normal. Pupils are equal, round, and reactive to light. Right eye exhibits no discharge. Left eye exhibits no discharge.  Neck: Normal range of motion. Neck supple. No JVD present. No tracheal deviation present. No thyromegaly present.  Cardiovascular: Normal rate, regular rhythm and normal heart sounds.   Pulmonary/Chest: No stridor. No respiratory distress. She has no wheezes.  Abdominal: Soft. Bowel sounds  are normal. She exhibits no distension and no mass. There is no tenderness. There is no rebound and no guarding.  Musculoskeletal: She exhibits tenderness (L groin w/palp and ROM). She exhibits no edema.  Lymphadenopathy:    She has no cervical adenopathy.  Neurological: She displays normal reflexes. No cranial nerve deficit. She exhibits normal muscle tone. Coordination normal.  Skin: No rash noted. No erythema.  Psychiatric: She has a normal mood and affect. Her behavior is normal. Judgment and thought content normal.  Acne, moles on face Intertrigo rash - better  L inner mid thigh with 11 mm crusted abscess  and 1-2 cm, less induration and erythema    Lab Results  Component Value Date   WBC 6.0 09/23/2010   HGB 13.8 09/23/2010   HCT 40.2 09/23/2010   PLT 198.0 09/23/2010   GLUCOSE 110* 10/07/2012   ALT 24 10/07/2012   AST 26 10/07/2012   NA 138 10/07/2012   K 4.0 10/07/2012   CL 104 10/07/2012   CREATININE 0.7 10/07/2012   BUN 17 10/07/2012   CO2 27 10/07/2012   TSH 0.78 05/15/2010   HGBA1C 5.6 10/07/2012          Assessment & Plan:

## 2013-02-08 NOTE — Assessment & Plan Note (Signed)
Better - start Doxy x 10 d Topical care discussed

## 2013-02-08 NOTE — Assessment & Plan Note (Signed)
D/c metformin XR due to diarrhea

## 2013-02-22 ENCOUNTER — Encounter: Payer: Self-pay | Admitting: Internal Medicine

## 2013-02-22 ENCOUNTER — Other Ambulatory Visit (INDEPENDENT_AMBULATORY_CARE_PROVIDER_SITE_OTHER): Payer: 59

## 2013-02-22 ENCOUNTER — Ambulatory Visit (INDEPENDENT_AMBULATORY_CARE_PROVIDER_SITE_OTHER): Payer: 59 | Admitting: Internal Medicine

## 2013-02-22 VITALS — BP 140/70 | HR 72 | Temp 97.2°F | Resp 16 | Wt 199.0 lb

## 2013-02-22 DIAGNOSIS — M545 Low back pain, unspecified: Secondary | ICD-10-CM

## 2013-02-22 DIAGNOSIS — IMO0001 Reserved for inherently not codable concepts without codable children: Secondary | ICD-10-CM

## 2013-02-22 DIAGNOSIS — F411 Generalized anxiety disorder: Secondary | ICD-10-CM

## 2013-02-22 DIAGNOSIS — M79609 Pain in unspecified limb: Secondary | ICD-10-CM

## 2013-02-22 DIAGNOSIS — E119 Type 2 diabetes mellitus without complications: Secondary | ICD-10-CM

## 2013-02-22 DIAGNOSIS — M79604 Pain in right leg: Secondary | ICD-10-CM

## 2013-02-22 DIAGNOSIS — E669 Obesity, unspecified: Secondary | ICD-10-CM

## 2013-02-22 LAB — BASIC METABOLIC PANEL
BUN: 18 mg/dL (ref 6–23)
CO2: 28 mEq/L (ref 19–32)
Calcium: 9.4 mg/dL (ref 8.4–10.5)
Chloride: 107 mEq/L (ref 96–112)
Creatinine, Ser: 0.7 mg/dL (ref 0.4–1.2)
GFR: 86.45 mL/min (ref >60.00)
Glucose, Bld: 110 mg/dL — ABNORMAL HIGH (ref 70–99)
Potassium: 4.1 mEq/L (ref 3.5–5.1)
Sodium: 141 mEq/L (ref 135–145)

## 2013-02-22 LAB — HEMOGLOBIN A1C: Hgb A1c MFr Bld: 5.8 % (ref 4.6–6.5)

## 2013-02-22 MED ORDER — PAROXETINE HCL 10 MG PO TABS: 10.0000 mg | ORAL_TABLET | ORAL | Status: DC

## 2013-02-22 NOTE — Assessment & Plan Note (Addendum)
Not better Cont w/wt loss Continue with current prescription therapy as reflected on the Med list.

## 2013-02-22 NOTE — Assessment & Plan Note (Signed)
Continue with current prescription therapy as reflected on the Med list.  

## 2013-02-22 NOTE — Assessment & Plan Note (Signed)
Worse. Start Paxil 10 mg/d

## 2013-02-22 NOTE — Progress Notes (Signed)
   Subjective:    HPI  F/u a L inner thigh cyst that got red and swollen - better now, not draining now...  The patient is here to follow up on chronic HTN- better, depression, anxiety, headaches and chronic moderate fibromyalgia/LBP symptoms.  She stopped Glucophage XR - diarrhea   Wt Readings from Last 3 Encounters:  02/22/13 199 lb (90.266 kg)  02/08/13 203 lb (92.08 kg)  02/02/13 201 lb (91.173 kg)   BP Readings from Last 3 Encounters:  02/22/13 140/70  02/08/13 140/80  02/02/13 130/72      Review of Systems  Constitutional: Positive for fatigue and unexpected weight change. Negative for chills, activity change and appetite change.  HENT: Negative for congestion and mouth sores.   Eyes: Negative for visual disturbance.  Respiratory: Negative for chest tightness.   Genitourinary: Negative for frequency, difficulty urinating and vaginal pain.  Musculoskeletal: Positive for arthralgias. Negative for gait problem.  Skin: Negative for pallor and rash.  Neurological: Negative for tremors.  Psychiatric/Behavioral: Positive for sleep disturbance. Negative for suicidal ideas and confusion. The patient is nervous/anxious.         Objective:   Physical Exam  Constitutional: She appears well-developed. No distress.  obese  HENT:  Head: Normocephalic.  Right Ear: External ear normal.  Left Ear: External ear normal.  Nose: Nose normal.  Mouth/Throat: Oropharynx is clear and moist.  Eyes: Conjunctivae are normal. Pupils are equal, round, and reactive to light. Right eye exhibits no discharge. Left eye exhibits no discharge.  Neck: Normal range of motion. Neck supple. No JVD present. No tracheal deviation present. No thyromegaly present.  Cardiovascular: Normal rate, regular rhythm and normal heart sounds.   Pulmonary/Chest: No stridor. No respiratory distress. She has no wheezes.  Abdominal: Soft. Bowel sounds are normal. She exhibits no distension and no mass. There is no  tenderness. There is no rebound and no guarding.  Musculoskeletal: She exhibits tenderness (L groin w/palp and ROM). She exhibits no edema.  Lymphadenopathy:    She has no cervical adenopathy.  Neurological: She displays normal reflexes. No cranial nerve deficit. She exhibits normal muscle tone. Coordination normal.  Skin: No rash noted. No erythema.  Psychiatric: She has a normal mood and affect. Her behavior is normal. Judgment and thought content normal.  Acne, moles on face Intertrigo rash - better  L inner mid thigh with resolved abscess - seb cyst  Lab Results  Component Value Date   WBC 6.0 09/23/2010   HGB 13.8 09/23/2010   HCT 40.2 09/23/2010   PLT 198.0 09/23/2010   GLUCOSE 110* 10/07/2012   ALT 24 10/07/2012   AST 26 10/07/2012   NA 138 10/07/2012   K 4.0 10/07/2012   CL 104 10/07/2012   CREATININE 0.7 10/07/2012   BUN 17 10/07/2012   CO2 27 10/07/2012   TSH 0.78 05/15/2010   HGBA1C 5.6 10/07/2012          Assessment & Plan:

## 2013-02-22 NOTE — Assessment & Plan Note (Signed)
Wt Readings from Last 3 Encounters:  02/22/13 199 lb (90.266 kg)  02/08/13 203 lb (92.08 kg)  02/02/13 201 lb (91.173 kg)

## 2013-02-22 NOTE — Assessment & Plan Note (Signed)
D/c metformin labs

## 2013-02-22 NOTE — Assessment & Plan Note (Signed)
Not better Cont w/wt loss

## 2013-03-02 ENCOUNTER — Encounter: Payer: Self-pay | Admitting: Internal Medicine

## 2013-03-02 ENCOUNTER — Ambulatory Visit (INDEPENDENT_AMBULATORY_CARE_PROVIDER_SITE_OTHER): Payer: 59 | Admitting: Internal Medicine

## 2013-03-02 VITALS — BP 140/80 | HR 80 | Temp 98.5°F | Resp 16

## 2013-03-02 DIAGNOSIS — B029 Zoster without complications: Secondary | ICD-10-CM

## 2013-03-02 MED ORDER — VALACYCLOVIR HCL 1 G PO TABS: 1000.0000 mg | ORAL_TABLET | Freq: Three times a day (TID) | ORAL | Status: DC

## 2013-03-02 NOTE — Assessment & Plan Note (Signed)
8/14 R cheek Valtrex Rx

## 2013-03-02 NOTE — Progress Notes (Signed)
   Subjective:    Rash This is a new problem. The current episode started in the past 7 days. The problem is unchanged. The affected locations include the face (R cheek). The rash is characterized by pain and burning. She was exposed to nothing. Associated symptoms include fatigue. Pertinent negatives include no congestion. (HA) The treatment provided no relief. There is no history of allergies.    F/u a L inner thigh cyst that got red and swollen -resolved The patient is here to follow up on chronic HTN- better, depression, anxiety, headaches and chronic moderate fibromyalgia/LBP symptoms.  She stopped Glucophage XR - diarrhea   Wt Readings from Last 3 Encounters:  02/22/13 199 lb (90.266 kg)  02/08/13 203 lb (92.08 kg)  02/02/13 201 lb (91.173 kg)   BP Readings from Last 3 Encounters:  03/02/13 140/80  02/22/13 140/70  02/08/13 140/80      Review of Systems  Constitutional: Positive for fatigue and unexpected weight change. Negative for chills, activity change and appetite change.  HENT: Negative for congestion and mouth sores.   Eyes: Negative for visual disturbance.  Respiratory: Negative for chest tightness.   Genitourinary: Negative for frequency, difficulty urinating and vaginal pain.  Musculoskeletal: Positive for arthralgias. Negative for gait problem.  Skin: Positive for rash. Negative for pallor.  Neurological: Negative for tremors.  Psychiatric/Behavioral: Positive for sleep disturbance. Negative for suicidal ideas and confusion. The patient is nervous/anxious.         Objective:   Physical Exam  Constitutional: She appears well-developed. No distress.  obese  HENT:  Head: Normocephalic.  Right Ear: External ear normal.  Left Ear: External ear normal.  Nose: Nose normal.  Mouth/Throat: Oropharynx is clear and moist.  Eyes: Conjunctivae are normal. Pupils are equal, round, and reactive to light. Right eye exhibits no discharge. Left eye exhibits no  discharge.  Neck: Normal range of motion. Neck supple. No JVD present. No tracheal deviation present. No thyromegaly present.  Cardiovascular: Normal rate, regular rhythm and normal heart sounds.   Pulmonary/Chest: No stridor. No respiratory distress. She has no wheezes.  Abdominal: Soft. Bowel sounds are normal. She exhibits no distension and no mass. There is no tenderness. There is no rebound and no guarding.  Musculoskeletal: She exhibits tenderness (L groin w/palp and ROM). She exhibits no edema.  Lymphadenopathy:    She has no cervical adenopathy.  Neurological: She displays normal reflexes. No cranial nerve deficit. She exhibits normal muscle tone. Coordination normal.  Skin: Rash (a cluster of blisters R cheek) noted. No erythema.  Psychiatric: She has a normal mood and affect. Her behavior is normal. Judgment and thought content normal.  Acne, moles on face Intertrigo rash - better  L inner mid thigh with resolved abscess - seb cyst  Lab Results  Component Value Date   WBC 6.0 09/23/2010   HGB 13.8 09/23/2010   HCT 40.2 09/23/2010   PLT 198.0 09/23/2010   GLUCOSE 110* 02/22/2013   ALT 24 10/07/2012   AST 26 10/07/2012   NA 141 02/22/2013   K 4.1 02/22/2013   CL 107 02/22/2013   CREATININE 0.7 02/22/2013   BUN 18 02/22/2013   CO2 28 02/22/2013   TSH 0.78 05/15/2010   HGBA1C 5.8 02/22/2013          Assessment & Plan:

## 2013-03-11 ENCOUNTER — Ambulatory Visit: Payer: 59 | Admitting: Internal Medicine

## 2013-03-18 ENCOUNTER — Encounter: Payer: Self-pay | Admitting: Internal Medicine

## 2013-03-18 ENCOUNTER — Ambulatory Visit (INDEPENDENT_AMBULATORY_CARE_PROVIDER_SITE_OTHER): Payer: 59 | Admitting: Internal Medicine

## 2013-03-18 VITALS — BP 122/64 | HR 86 | Temp 97.5°F | Wt 197.0 lb

## 2013-03-18 DIAGNOSIS — E119 Type 2 diabetes mellitus without complications: Secondary | ICD-10-CM

## 2013-03-18 DIAGNOSIS — M79609 Pain in unspecified limb: Secondary | ICD-10-CM

## 2013-03-18 DIAGNOSIS — M79604 Pain in right leg: Secondary | ICD-10-CM

## 2013-03-18 DIAGNOSIS — F411 Generalized anxiety disorder: Secondary | ICD-10-CM

## 2013-03-18 DIAGNOSIS — B029 Zoster without complications: Secondary | ICD-10-CM

## 2013-03-18 MED ORDER — HYDROCODONE-ACETAMINOPHEN 7.5-325 MG PO TABS: 1.0000 | ORAL_TABLET | Freq: Three times a day (TID) | ORAL | Status: DC | PRN

## 2013-03-18 MED ORDER — GABAPENTIN 300 MG PO CAPS: 300.0000 mg | ORAL_CAPSULE | Freq: Three times a day (TID) | ORAL | Status: DC

## 2013-03-18 NOTE — Assessment & Plan Note (Signed)
Continue with current prescription therapy as reflected on the Med list.  

## 2013-03-18 NOTE — Assessment & Plan Note (Signed)
Resolved

## 2013-03-18 NOTE — Patient Instructions (Signed)
Stretch hips 

## 2013-03-18 NOTE — Progress Notes (Signed)
   Subjective:    Leg Pain  The incident occurred more than 1 week ago. There was no injury mechanism. The pain is present in the right thigh, right hip and right knee. The quality of the pain is described as burning. The pain is at a severity of 7/10. The pain is severe. The pain has been constant since onset. The symptoms are aggravated by movement and weight bearing. She has tried rest and NSAIDs for the symptoms. The treatment provided mild relief.    F/u a L inner thigh cyst -resolved  The patient is here to follow up on chronic HTN- better, depression, anxiety, headaches and chronic moderate fibromyalgia/LBP symptoms.  She stopped Glucophage XR - diarrhea   Wt Readings from Last 3 Encounters:  03/18/13 197 lb (89.359 kg)  02/22/13 199 lb (90.266 kg)  02/08/13 203 lb (92.08 kg)   BP Readings from Last 3 Encounters:  03/18/13 122/64  03/02/13 140/80  02/22/13 140/70      Review of Systems  Constitutional: Positive for unexpected weight change. Negative for chills, activity change and appetite change.  HENT: Negative for mouth sores.   Eyes: Negative for visual disturbance.  Respiratory: Negative for chest tightness.   Genitourinary: Negative for frequency, difficulty urinating and vaginal pain.  Musculoskeletal: Positive for arthralgias. Negative for gait problem.  Skin: Negative for pallor.  Neurological: Negative for tremors.  Psychiatric/Behavioral: Positive for sleep disturbance. Negative for suicidal ideas and confusion. The patient is nervous/anxious.         Objective:   Physical Exam  Constitutional: She appears well-developed. No distress.  obese  HENT:  Head: Normocephalic.  Right Ear: External ear normal.  Left Ear: External ear normal.  Nose: Nose normal.  Mouth/Throat: Oropharynx is clear and moist.  Eyes: Conjunctivae are normal. Pupils are equal, round, and reactive to light. Right eye exhibits no discharge. Left eye exhibits no discharge.  Neck:  Normal range of motion. Neck supple. No JVD present. No tracheal deviation present. No thyromegaly present.  Cardiovascular: Normal rate, regular rhythm and normal heart sounds.   Pulmonary/Chest: No stridor. No respiratory distress. She has no wheezes.  Abdominal: Soft. Bowel sounds are normal. She exhibits no distension and no mass. There is no tenderness. There is no rebound and no guarding.  Musculoskeletal: She exhibits tenderness (L groin w/palp and ROM). She exhibits no edema.  Lymphadenopathy:    She has no cervical adenopathy.  Neurological: She displays normal reflexes. No cranial nerve deficit. She exhibits normal muscle tone. Coordination normal.  Skin: Rash (a cluster of blisters R cheek) noted. No erythema.  Psychiatric: She has a normal mood and affect. Her behavior is normal. Judgment and thought content normal.  Acne, moles on face Intertrigo rash - better  L inner mid thigh with resolved abscess - seb cyst  Lab Results  Component Value Date   WBC 6.0 09/23/2010   HGB 13.8 09/23/2010   HCT 40.2 09/23/2010   PLT 198.0 09/23/2010   GLUCOSE 110* 02/22/2013   ALT 24 10/07/2012   AST 26 10/07/2012   NA 141 02/22/2013   K 4.1 02/22/2013   CL 107 02/22/2013   CREATININE 0.7 02/22/2013   BUN 18 02/22/2013   CO2 28 02/22/2013   TSH 0.78 05/15/2010   HGBA1C 5.8 02/22/2013          Assessment & Plan:

## 2013-03-18 NOTE — Assessment & Plan Note (Addendum)
9/14 R leg pain ?etiol -- SI joint pail, ileo-psoas pain, troch major pain Stretch See meds

## 2013-03-20 ENCOUNTER — Emergency Department (HOSPITAL_COMMUNITY)
Admission: EM | Admit: 2013-03-20 | Discharge: 2013-03-20 | Disposition: A | Discharge status: Home / Self Care | Payer: PRIVATE HEALTH INSURANCE | Attending: Emergency Medicine | Admitting: Emergency Medicine

## 2013-03-20 ENCOUNTER — Encounter (HOSPITAL_COMMUNITY): Payer: Self-pay | Admitting: *Deleted

## 2013-03-20 ENCOUNTER — Encounter: Payer: Self-pay | Admitting: Internal Medicine

## 2013-03-20 DIAGNOSIS — F411 Generalized anxiety disorder: Secondary | ICD-10-CM | POA: Insufficient documentation

## 2013-03-20 DIAGNOSIS — IMO0001 Reserved for inherently not codable concepts without codable children: Secondary | ICD-10-CM | POA: Insufficient documentation

## 2013-03-20 DIAGNOSIS — Z8739 Personal history of other diseases of the musculoskeletal system and connective tissue: Secondary | ICD-10-CM | POA: Insufficient documentation

## 2013-03-20 DIAGNOSIS — M545 Low back pain, unspecified: Secondary | ICD-10-CM | POA: Insufficient documentation

## 2013-03-20 DIAGNOSIS — G43909 Migraine, unspecified, not intractable, without status migrainosus: Secondary | ICD-10-CM | POA: Insufficient documentation

## 2013-03-20 DIAGNOSIS — Z8709 Personal history of other diseases of the respiratory system: Secondary | ICD-10-CM | POA: Insufficient documentation

## 2013-03-20 DIAGNOSIS — J449 Chronic obstructive pulmonary disease, unspecified: Secondary | ICD-10-CM | POA: Insufficient documentation

## 2013-03-20 DIAGNOSIS — M792 Neuralgia and neuritis, unspecified: Secondary | ICD-10-CM

## 2013-03-20 DIAGNOSIS — F3289 Other specified depressive episodes: Secondary | ICD-10-CM | POA: Insufficient documentation

## 2013-03-20 DIAGNOSIS — I10 Essential (primary) hypertension: Secondary | ICD-10-CM | POA: Insufficient documentation

## 2013-03-20 DIAGNOSIS — Z8669 Personal history of other diseases of the nervous system and sense organs: Secondary | ICD-10-CM | POA: Insufficient documentation

## 2013-03-20 DIAGNOSIS — IMO0002 Reserved for concepts with insufficient information to code with codable children: Secondary | ICD-10-CM | POA: Insufficient documentation

## 2013-03-20 DIAGNOSIS — F329 Major depressive disorder, single episode, unspecified: Secondary | ICD-10-CM | POA: Insufficient documentation

## 2013-03-20 DIAGNOSIS — J4489 Other specified chronic obstructive pulmonary disease: Secondary | ICD-10-CM | POA: Insufficient documentation

## 2013-03-20 DIAGNOSIS — M549 Dorsalgia, unspecified: Secondary | ICD-10-CM | POA: Insufficient documentation

## 2013-03-20 DIAGNOSIS — R269 Unspecified abnormalities of gait and mobility: Secondary | ICD-10-CM | POA: Insufficient documentation

## 2013-03-20 DIAGNOSIS — Z87891 Personal history of nicotine dependence: Secondary | ICD-10-CM | POA: Insufficient documentation

## 2013-03-20 DIAGNOSIS — Z79899 Other long term (current) drug therapy: Secondary | ICD-10-CM | POA: Insufficient documentation

## 2013-03-20 MED ORDER — PREDNISONE 20 MG PO TABS: ORAL_TABLET | ORAL | Status: DC

## 2013-03-20 MED ORDER — OXYCODONE-ACETAMINOPHEN 5-325 MG PO TABS
2.0000 | ORAL_TABLET | Freq: Once | ORAL | Status: AC
  Administered 2013-03-20: 2 via ORAL
  Filled 2013-03-20: qty 2

## 2013-03-20 NOTE — ED Notes (Signed)
Pt reports right lower back pain radiating to right leg and groin.

## 2013-03-20 NOTE — Assessment & Plan Note (Signed)
Continue with current prescription therapy as reflected on the Med list.  

## 2013-03-20 NOTE — ED Provider Notes (Signed)
CSN: [id number]     Arrival date & time 03/20/13  1203 History   First MD Initiated Contact with Patient 03/20/13 1217     No chief complaint on file.  (Consider location/radiation/quality/duration/timing/severity/associated sxs/prior Treatment) HPI Comments: Patient is a 67 year old female who presents today for one week of gradually worsening hip and right leg pain. She has been evaluated by her primary care physician who gave her gabapentin for this pain. The gabapentin has been helpful, but the pain is still severe when she stands up and walks. The pain is burning. She reports she is in very little pain when she is laying either on her back or her stomach. She was history of a "bad back" that she's had for many years. Upon review of her previous PCP visit, it appears her physician and he believes that this is either muscular or sciatic nerves in etiology. No bowel or bladder incontinence, history of cancer, drug abuse. No fevers, chills, nausea, vomiting, weakness, numbness, parathesias.  The history is provided by the patient. No language interpreter was used.    Past Medical History  Diagnosis Date  . HYPERLIPIDEMIA 04/30/2006  . ANXIETY 05/25/2006  . 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 INSOMNIA, PERSISTENT 02/22/2008  . DEPRESSION 01/02/2006  . OBSTRUCTIVE SLEEP APNEA 04/17/2010  . 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 ESOTROPIA, LEFT EYE Oct 10, 1945  . HYPERTENSION 05/25/2006  . BRONCHITIS, ACUTE 12/08/2007  . COPD 05/25/2006  . T M J 05/11/2009  . OSTEOARTHRITIS 05/25/2006  . LOW BACK PAIN 01/27/2007  . FIBROMYALGIA 04/02/2006  . Headache(784.0) 12/08/2007  . HYPERGLYCEMIA 05/15/2010  . Abdominal pain, epigastric 12/18/2009  . Lazy eye     left  . Polyuria   . Migraines   . Psychosomatic disease 2011   Past Surgical History  Procedure Laterality Date  . Cholecystectomy     Family History  Problem Relation Age of Onset  . Heart attack Father   . Hypertension Brother   . Heart  disease Mother    History  Substance Use Topics  . Smoking status: Former Smoker    Quit date: 04/13/2010  . Smokeless tobacco: Not on file  . Alcohol Use: No   OB History   Grav Para Term Preterm Abortions TAB SAB Ect Mult Living                 Review of Systems  Constitutional: Negative for fever and chills.  Respiratory: Negative for shortness of breath.   Cardiovascular: Negative for chest pain.  Gastrointestinal: Negative for nausea, vomiting and abdominal pain.  Musculoskeletal: Positive for myalgias, back pain, arthralgias and gait problem.  Skin: Negative for rash.  All other systems reviewed and are negative.    Allergies  Bupropion hcl; Duloxetine; Ezetimibe; Metformin and related; Tramadol hcl; and Trazodone hcl  Home Medications   Current Outpatient Rx  Name  Route  Sig  Dispense  Refill  . ALPRAZolam (XANAX) 0.5 MG tablet   Oral   Take 1 tablet (0.5 mg total) by mouth 2 (two) times daily.   60 tablet   2   . cholecalciferol (VITAMIN D) 1000 UNITS tablet   Oral   Take 1,000 Units by mouth daily.           Marland Kitchen gabapentin (NEURONTIN) 300 MG capsule   Oral   Take 300 mg by mouth 3 (three) times daily.         Marland Kitchen HYDROcodone-acetaminophen (NORCO) 7.5-325 MG per tablet   Oral   Take 1  tablet by mouth every 8 (eight) hours as needed for pain.   90 tablet   1   . metoprolol succinate (TOPROL-XL) 25 MG 24 hr tablet   Oral   Take 25 mg by mouth daily.          BP 119/65  Pulse 82  Temp(Src) 97.8 F (36.6 C)  Resp 18  SpO2 98% Physical Exam  Nursing note and vitals reviewed. Constitutional: She is oriented to person, place, and time. She appears well-developed and well-nourished. No distress.  HENT:  Head: Normocephalic and atraumatic.  Right Ear: External ear normal.  Left Ear: External ear normal.  Nose: Nose normal.  Mouth/Throat: Oropharynx is clear and moist.  Eyes: Conjunctivae are normal.  Neck: Normal range of motion.   Cardiovascular: Normal rate, regular rhythm, normal heart sounds, intact distal pulses and normal pulses.   Pulses:      Femoral pulses are 2+ on the right side, and 2+ on the left side. Pulmonary/Chest: Effort normal and breath sounds normal. No stridor. No respiratory distress. She has no wheezes. She has no rales.  Abdominal: Soft. She exhibits no distension.  Musculoskeletal:       Cervical back: She exhibits normal range of motion, no tenderness and no bony tenderness.       Thoracic back: She exhibits normal range of motion, no tenderness and no bony tenderness.       Lumbar back: She exhibits normal range of motion, no tenderness and no bony tenderness.  ttp in right groin   Lymphadenopathy:    She has no cervical adenopathy.       Right: No inguinal adenopathy present.       Left: No inguinal adenopathy present.  Neurological: She is alert and oriented to person, place, and time. She has normal strength.  Skin: Skin is warm and dry. She is not diaphoretic. No erythema.  Psychiatric: She has a normal mood and affect. Her behavior is normal.    ED Course  Procedures (including critical care time) Labs Review Labs Reviewed - No data to display Imaging Review No results found.  MDM   1. Neuropathic pain    Patient with back pain.  No neurological deficits and normal neuro exam.  Patient can walk but states is painful.  No loss of bowel or bladder control.  No concern for cauda equina.  No fever, night sweats, weight loss, h/o cancer, IVDU.  RICE protocol and pain medicine indicated and discussed with patient. Follow up with your PCP and continue gabapentin. Return instructions given. Vital signs stable for discharge.     Mora Bellman, PA-C 03/21/13 1433

## 2013-03-23 NOTE — ED Provider Notes (Signed)
Medical screening examination/treatment/procedure(s) were performed by non-physician practitioner and as supervising physician I was immediately available for consultation/collaboration.  Siah Steely R. Russell Quinney, MD 03/23/13 2345 

## 2013-03-29 ENCOUNTER — Other Ambulatory Visit (INDEPENDENT_AMBULATORY_CARE_PROVIDER_SITE_OTHER): Payer: 59

## 2013-03-29 ENCOUNTER — Encounter: Payer: Self-pay | Admitting: Internal Medicine

## 2013-03-29 ENCOUNTER — Ambulatory Visit (INDEPENDENT_AMBULATORY_CARE_PROVIDER_SITE_OTHER): Payer: 59 | Admitting: Internal Medicine

## 2013-03-29 VITALS — BP 134/70 | HR 80 | Temp 96.5°F | Resp 16 | Wt 197.0 lb

## 2013-03-29 DIAGNOSIS — M545 Low back pain, unspecified: Secondary | ICD-10-CM

## 2013-03-29 DIAGNOSIS — N390 Urinary tract infection, site not specified: Secondary | ICD-10-CM

## 2013-03-29 DIAGNOSIS — M79609 Pain in unspecified limb: Secondary | ICD-10-CM

## 2013-03-29 DIAGNOSIS — M79604 Pain in right leg: Secondary | ICD-10-CM

## 2013-03-29 LAB — URINALYSIS, ROUTINE W REFLEX MICROSCOPIC
Bilirubin Urine: NEGATIVE
Ketones, ur: NEGATIVE
Nitrite: NEGATIVE
Specific Gravity, Urine: >=1.03 (ref 1.000–1.030)
Total Protein, Urine: 30
Urine Glucose: NEGATIVE
Urobilinogen, UA: 0.2 (ref 0.0–1.0)
pH: 6 (ref 5.0–8.0)

## 2013-03-29 MED ORDER — METHYLPREDNISOLONE ACETATE 80 MG/ML IJ SUSP
80.0000 mg | Freq: Once | INTRAMUSCULAR | Status: AC
  Administered 2013-03-29: 80 mg via INTRAMUSCULAR

## 2013-03-29 MED ORDER — CIPROFLOXACIN HCL 250 MG PO TABS: 250.0000 mg | ORAL_TABLET | Freq: Two times a day (BID) | ORAL | Status: DC

## 2013-03-29 NOTE — Assessment & Plan Note (Signed)
Cipro x 10 days

## 2013-03-29 NOTE — Assessment & Plan Note (Signed)
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

## 2013-03-29 NOTE — Assessment & Plan Note (Signed)
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

## 2013-03-29 NOTE — Progress Notes (Signed)
Patient ID: Amanda Arnold, female   DOB: 06/15/1946, 67 y.o.   MRN: [id number]   Subjective:    Leg Pain  The incident occurred more than 1 week ago. There was no injury mechanism. The pain is present in the right thigh, right hip and right knee. The quality of the pain is described as burning. The pain is at a severity of 7/10. The pain is severe. The pain has been constant since onset. The symptoms are aggravated by movement and weight bearing. She has tried rest and NSAIDs for the symptoms. The treatment provided mild relief.  R side pain (R buttock,hip, groin) is 8/10 worse w/ROM, better w/rest Worse She went to ER on 9/7  F/u a L inner thigh cyst -resolved  The patient is here to follow up on chronic HTN- better, depression, anxiety, headaches and chronic moderate fibromyalgia/LBP symptoms.  She stopped Glucophage XR - diarrhea   Wt Readings from Last 3 Encounters:  03/29/13 197 lb (89.359 kg)  03/18/13 197 lb (89.359 kg)  02/22/13 199 lb (90.266 kg)   BP Readings from Last 3 Encounters:  03/29/13 134/70  03/20/13 119/65  03/18/13 122/64      Review of Systems  Constitutional: Positive for unexpected weight change. Negative for chills, activity change and appetite change.  HENT: Negative for mouth sores.   Eyes: Negative for visual disturbance.  Respiratory: Negative for chest tightness.   Genitourinary: Negative for frequency, difficulty urinating and vaginal pain.  Musculoskeletal: Positive for arthralgias. Negative for gait problem.  Skin: Negative for pallor.  Neurological: Negative for tremors.  Psychiatric/Behavioral: Positive for sleep disturbance. Negative for suicidal ideas and confusion. The patient is nervous/anxious.         Objective:   Physical Exam  Constitutional: She appears well-developed. No distress.  obese  HENT:  Head: Normocephalic.  Right Ear: External ear normal.  Left Ear: External ear normal.  Nose: Nose normal.  Mouth/Throat:  Oropharynx is clear and moist.  Eyes: Conjunctivae are normal. Pupils are equal, round, and reactive to light. Right eye exhibits no discharge. Left eye exhibits no discharge.  Neck: Normal range of motion. Neck supple. No JVD present. No tracheal deviation present. No thyromegaly present.  Cardiovascular: Normal rate, regular rhythm and normal heart sounds.   Pulmonary/Chest: No stridor. No respiratory distress. She has no wheezes.  Abdominal: Soft. Bowel sounds are normal. She exhibits no distension and no mass. There is no tenderness. There is no rebound and no guarding.  Musculoskeletal: She exhibits tenderness (L groin w/palp and ROM). She exhibits no edema.  Lymphadenopathy:    She has no cervical adenopathy.  Neurological: She displays normal reflexes. No cranial nerve deficit. She exhibits normal muscle tone. Coordination normal.  Skin: Rash (a cluster of blisters R cheek) noted. No erythema.  Psychiatric: She has a normal mood and affect. Her behavior is normal. Judgment and thought content normal.  Acne, moles on face Intertrigo rash - resolved R knee flexors, extensors 5-/5 R knee DTR a little decreased  L inner mid thigh with resolved abscess - seb cyst  Lab Results  Component Value Date   WBC 6.0 09/23/2010   HGB 13.8 09/23/2010   HCT 40.2 09/23/2010   PLT 198.0 09/23/2010   GLUCOSE 110* 02/22/2013   ALT 24 10/07/2012   AST 26 10/07/2012   NA 141 02/22/2013   K 4.1 02/22/2013   CL 107 02/22/2013   CREATININE 0.7 02/22/2013   BUN 18 02/22/2013   CO2 28  02/22/2013   TSH 0.78 05/15/2010   HGBA1C 5.8 02/22/2013          Assessment & Plan:

## 2013-03-30 ENCOUNTER — Telehealth: Payer: Self-pay | Admitting: *Deleted

## 2013-03-30 MED ORDER — DIAZEPAM 10 MG PO TABS: ORAL_TABLET | ORAL | Status: DC

## 2013-03-30 NOTE — Telephone Encounter (Signed)
Ok Diazepam 10 mg Thx

## 2013-03-30 NOTE — Telephone Encounter (Signed)
Pt states she has MRI scheduled this Friday. She needs a Rx for something to help her lay still. Please advise.

## 2013-03-31 NOTE — Telephone Encounter (Signed)
Done. Pt informed.

## 2013-04-01 ENCOUNTER — Ambulatory Visit
Admission: RE | Admit: 2013-04-01 | Discharge: 2013-04-01 | Disposition: A | Discharge status: Home / Self Care | Payer: PRIVATE HEALTH INSURANCE | Source: Ambulatory Visit | Attending: Internal Medicine | Admitting: Internal Medicine

## 2013-04-01 ENCOUNTER — Other Ambulatory Visit: Payer: Self-pay | Admitting: Internal Medicine

## 2013-04-01 DIAGNOSIS — M79604 Pain in right leg: Secondary | ICD-10-CM

## 2013-04-01 DIAGNOSIS — M545 Low back pain, unspecified: Secondary | ICD-10-CM

## 2013-04-01 NOTE — Telephone Encounter (Signed)
The patient's daughter called hoping to get the patient a pain medicine called in for leg pain.   Daughter's callback - [phone]

## 2013-04-05 ENCOUNTER — Ambulatory Visit (INDEPENDENT_AMBULATORY_CARE_PROVIDER_SITE_OTHER): Payer: 59 | Admitting: Internal Medicine

## 2013-04-05 ENCOUNTER — Encounter: Payer: Self-pay | Admitting: Internal Medicine

## 2013-04-05 VITALS — BP 150/90 | HR 72 | Temp 99.3°F | Resp 16

## 2013-04-05 DIAGNOSIS — M79609 Pain in unspecified limb: Secondary | ICD-10-CM

## 2013-04-05 DIAGNOSIS — M545 Low back pain, unspecified: Secondary | ICD-10-CM

## 2013-04-05 DIAGNOSIS — E119 Type 2 diabetes mellitus without complications: Secondary | ICD-10-CM

## 2013-04-05 DIAGNOSIS — M79604 Pain in right leg: Secondary | ICD-10-CM

## 2013-04-05 MED ORDER — METHYLPREDNISOLONE ACETATE 80 MG/ML IJ SUSP
80.0000 mg | Freq: Once | INTRAMUSCULAR | Status: AC
  Administered 2013-04-05: 80 mg via INTRAMUSCULAR

## 2013-04-05 MED ORDER — TIZANIDINE HCL 4 MG PO TABS: 4.0000 mg | ORAL_TABLET | Freq: Three times a day (TID) | ORAL | Status: DC | PRN

## 2013-04-05 NOTE — Assessment & Plan Note (Signed)
Chronic  MRI 9/14: IMPRESSION:  Multilevel lumbar spondylosis and facet arthrosis. In this patient  with right lower extremity radicular symptoms, L3-L4 disease is most  likely with compression of the exiting right L3 nerve in the lateral  aspect of the foramen. This is associated with a right-sided disk  protrusion compressing the nerve between protrusion and  hypertrophied facet joints.

## 2013-04-05 NOTE — Telephone Encounter (Signed)
Pt informed. She states she took it tid for a few days and it caused HA and diarrhea.

## 2013-04-05 NOTE — Progress Notes (Signed)
Patient ID: Amanda Arnold, female   DOB: 03-Oct-1945, 67 y.o.   MRN: [id number] Patient ID: Amanda Arnold, female   DOB: 12-10-1945, 67 y.o.   MRN: [id number]   Subjective:    Leg Pain  The incident occurred more than 1 week ago (3 wks). There was no injury mechanism. The pain is present in the right thigh, right hip and right knee. The quality of the pain is described as burning. The pain is at a severity of 3/10. The pain is moderate. The pain has been constant since onset. The symptoms are aggravated by movement and weight bearing. She has tried rest and NSAIDs for the symptoms. The treatment provided mild relief.  R side pain (R buttock,hip, groin) is 3/10 worse w/ROM, better w/rest Worse She went to ER on 9/7  F/u a L inner thigh cyst -resolved  The patient is here to follow up on chronic HTN- better, depression, anxiety, headaches and chronic moderate fibromyalgia/LBP symptoms.  She stopped Glucophage XR - diarrhea   Wt Readings from Last 3 Encounters:  03/29/13 197 lb (89.359 kg)  03/18/13 197 lb (89.359 kg)  02/22/13 199 lb (90.266 kg)   BP Readings from Last 3 Encounters:  04/05/13 150/90  03/29/13 134/70  03/20/13 119/65      Review of Systems  Constitutional: Positive for unexpected weight change. Negative for chills, activity change and appetite change.  HENT: Negative for mouth sores.   Eyes: Negative for visual disturbance.  Respiratory: Negative for chest tightness.   Genitourinary: Negative for frequency, difficulty urinating and vaginal pain.  Musculoskeletal: Positive for arthralgias. Negative for gait problem.  Skin: Negative for pallor.  Neurological: Negative for tremors.  Psychiatric/Behavioral: Positive for sleep disturbance. Negative for suicidal ideas and confusion. The patient is nervous/anxious.         Objective:   Physical Exam  Constitutional: She appears well-developed. No distress.  obese  HENT:  Head: Normocephalic.  Right Ear:  External ear normal.  Left Ear: External ear normal.  Nose: Nose normal.  Mouth/Throat: Oropharynx is clear and moist.  Eyes: Conjunctivae are normal. Pupils are equal, round, and reactive to light. Right eye exhibits no discharge. Left eye exhibits no discharge.  Neck: Normal range of motion. Neck supple. No JVD present. No tracheal deviation present. No thyromegaly present.  Cardiovascular: Normal rate, regular rhythm and normal heart sounds.   Pulmonary/Chest: No stridor. No respiratory distress. She has no wheezes.  Abdominal: Soft. Bowel sounds are normal. She exhibits no distension and no mass. There is no tenderness. There is no rebound and no guarding.  Musculoskeletal: She exhibits tenderness (L groin w/palp and ROM). She exhibits no edema.  Lymphadenopathy:    She has no cervical adenopathy.  Neurological: She displays normal reflexes. No cranial nerve deficit. She exhibits normal muscle tone. Coordination normal.  Skin: Rash (a cluster of blisters R cheek) noted. No erythema.  Psychiatric: She has a normal mood and affect. Her behavior is normal. Judgment and thought content normal.  Acne, moles on face Intertrigo rash - resolved R knee flexors, extensors 5-/5 R knee DTR a little decreased  L inner mid thigh with resolved abscess - seb cyst  Lab Results  Component Value Date   WBC 6.0 09/23/2010   HGB 13.8 09/23/2010   HCT 40.2 09/23/2010   PLT 198.0 09/23/2010   GLUCOSE 110* 02/22/2013   ALT 24 10/07/2012   AST 26 10/07/2012   NA 141 02/22/2013   K 4.1 02/22/2013  CL 107 02/22/2013   CREATININE 0.7 02/22/2013   BUN 18 02/22/2013   CO2 28 02/22/2013   TSH 0.78 05/15/2010   HGBA1C 5.8 02/22/2013          Assessment & Plan:

## 2013-04-05 NOTE — Assessment & Plan Note (Signed)
Chronic  MRI 9/14: IMPRESSION:  Multilevel lumbar spondylosis and facet arthrosis. In this patient  with right lower extremity radicular symptoms, L3-L4 disease is most  likely with compression of the exiting right L3 nerve in the lateral  aspect of the foramen. This is associated with a right-sided disk  protrusion compressing the nerve between protrusion and  hypertrophied facet joints.  Depomedrol 80 mg im NS consult if needed

## 2013-04-05 NOTE — Telephone Encounter (Signed)
Add Gabapentin 300-600 mg tid prn Thx

## 2013-04-06 NOTE — Assessment & Plan Note (Signed)
Cont w/diet 

## 2013-04-28 ENCOUNTER — Telehealth: Payer: Self-pay | Admitting: Internal Medicine

## 2013-04-28 NOTE — Telephone Encounter (Signed)
04/28/2013   Pt is requesting a refill of RX : HYDROcodone-acetaminophen (NORCO) 7.5-325 MG per tablet .Marland Kitchen..please advise.

## 2013-04-29 NOTE — Telephone Encounter (Signed)
Ok if time Thx 

## 2013-04-29 NOTE — Telephone Encounter (Signed)
Defer to Dr. Posey Rea

## 2013-05-02 MED ORDER — HYDROCODONE-ACETAMINOPHEN 7.5-325 MG PO TABS: 1.0000 | ORAL_TABLET | Freq: Three times a day (TID) | ORAL | Status: DC | PRN

## 2013-05-02 NOTE — Telephone Encounter (Signed)
Rx printed/signed/Pt informed

## 2013-05-05 ENCOUNTER — Encounter: Payer: Self-pay | Admitting: Internal Medicine

## 2013-05-23 ENCOUNTER — Other Ambulatory Visit: Payer: Self-pay | Admitting: Internal Medicine

## 2013-05-27 ENCOUNTER — Ambulatory Visit (INDEPENDENT_AMBULATORY_CARE_PROVIDER_SITE_OTHER): Payer: PRIVATE HEALTH INSURANCE | Admitting: Internal Medicine

## 2013-05-27 ENCOUNTER — Other Ambulatory Visit: Payer: Self-pay

## 2013-05-27 ENCOUNTER — Other Ambulatory Visit (INDEPENDENT_AMBULATORY_CARE_PROVIDER_SITE_OTHER): Payer: PRIVATE HEALTH INSURANCE

## 2013-05-27 ENCOUNTER — Encounter: Payer: Self-pay | Admitting: Internal Medicine

## 2013-05-27 VITALS — BP 104/60 | HR 76 | Temp 97.1°F | Resp 16 | Wt 195.0 lb

## 2013-05-27 DIAGNOSIS — M545 Low back pain, unspecified: Secondary | ICD-10-CM

## 2013-05-27 DIAGNOSIS — N390 Urinary tract infection, site not specified: Secondary | ICD-10-CM

## 2013-05-27 DIAGNOSIS — IMO0001 Reserved for inherently not codable concepts without codable children: Secondary | ICD-10-CM

## 2013-05-27 DIAGNOSIS — Z1231 Encounter for screening mammogram for malignant neoplasm of breast: Secondary | ICD-10-CM

## 2013-05-27 DIAGNOSIS — E119 Type 2 diabetes mellitus without complications: Secondary | ICD-10-CM

## 2013-05-27 LAB — BASIC METABOLIC PANEL
BUN: 17 mg/dL (ref 6–23)
CO2: 27 mEq/L (ref 19–32)
Calcium: 9.2 mg/dL (ref 8.4–10.5)
Chloride: 107 mEq/L (ref 96–112)
Creatinine, Ser: 0.7 mg/dL (ref 0.4–1.2)
GFR: 97.2 mL/min (ref >60.00)
Glucose, Bld: 111 mg/dL — ABNORMAL HIGH (ref 70–99)
Potassium: 4.4 mEq/L (ref 3.5–5.1)
Sodium: 139 mEq/L (ref 135–145)

## 2013-05-27 LAB — URINALYSIS, ROUTINE W REFLEX MICROSCOPIC
Bilirubin Urine: NEGATIVE
Hgb urine dipstick: NEGATIVE
Ketones, ur: NEGATIVE
Nitrite: NEGATIVE
Specific Gravity, Urine: >=1.03 (ref 1.000–1.030)
Total Protein, Urine: NEGATIVE
Urine Glucose: NEGATIVE
Urobilinogen, UA: 0.2 (ref 0.0–1.0)
pH: 5.5 (ref 5.0–8.0)

## 2013-05-27 LAB — HEMOGLOBIN A1C: Hgb A1c MFr Bld: 5.8 % (ref 4.6–6.5)

## 2013-05-27 MED ORDER — ALPRAZOLAM 0.5 MG PO TABS: 0.5000 mg | ORAL_TABLET | Freq: Two times a day (BID) | ORAL | Status: DC

## 2013-05-27 MED ORDER — HYDROCODONE-ACETAMINOPHEN 7.5-325 MG PO TABS: 1.0000 | ORAL_TABLET | Freq: Three times a day (TID) | ORAL | Status: DC | PRN

## 2013-05-27 NOTE — Progress Notes (Signed)
Pre visit review using our clinic review tool, if applicable. No additional management support is needed unless otherwise documented below in the visit note. 

## 2013-05-27 NOTE — Progress Notes (Signed)
Subjective:  C/o pain in R shoulder blade w/ROM x 1 mo Overall doing better  Leg Pain  The incident occurred more than 1 week ago (3 wks). There was no injury mechanism. The pain is present in the right thigh, right hip and right knee. The quality of the pain is described as burning. The pain is at a severity of 3/10. The pain is moderate. The pain has been constant since onset. The symptoms are aggravated by movement and weight bearing. She has tried rest and NSAIDs for the symptoms. The treatment provided mild relief.  R side pain (R buttock,hip, groin) is 3/10 worse w/ROM, better w/rest Worse She went to ER on 9/7  F/u a L inner thigh cyst -resolved  The patient is here to follow up on chronic HTN- better, depression, anxiety, headaches and chronic moderate fibromyalgia/LBP symptoms.  She stopped Glucophage XR - diarrhea   Wt Readings from Last 3 Encounters:  05/27/13 195 lb (88.451 kg)  03/29/13 197 lb (89.359 kg)  03/18/13 197 lb (89.359 kg)   BP Readings from Last 3 Encounters:  05/27/13 104/60  04/05/13 150/90  03/29/13 134/70      Review of Systems  Constitutional: Positive for unexpected weight change. Negative for chills, activity change and appetite change.  HENT: Negative for mouth sores.   Eyes: Negative for visual disturbance.  Respiratory: Negative for chest tightness.   Genitourinary: Negative for frequency, difficulty urinating and vaginal pain.  Musculoskeletal: Positive for arthralgias. Negative for gait problem.  Skin: Negative for pallor.  Neurological: Negative for tremors.  Psychiatric/Behavioral: Positive for sleep disturbance. Negative for suicidal ideas and confusion. The patient is nervous/anxious.         Objective:   Physical Exam  Constitutional: She appears well-developed. No distress.  obese  HENT:  Head: Normocephalic.  Right Ear: External ear normal.  Left Ear: External ear normal.  Nose: Nose normal.  Mouth/Throat: Oropharynx  is clear and moist.  Eyes: Conjunctivae are normal. Pupils are equal, round, and reactive to light. Right eye exhibits no discharge. Left eye exhibits no discharge.  Neck: Normal range of motion. Neck supple. No JVD present. No tracheal deviation present. No thyromegaly present.  Cardiovascular: Normal rate, regular rhythm and normal heart sounds.   Pulmonary/Chest: No stridor. No respiratory distress. She has no wheezes.  Abdominal: Soft. Bowel sounds are normal. She exhibits no distension and no mass. There is no tenderness. There is no rebound and no guarding.  Musculoskeletal: She exhibits tenderness (L groin w/palp and ROM). She exhibits no edema.  Lymphadenopathy:    She has no cervical adenopathy.  Neurological: She displays normal reflexes. No cranial nerve deficit. She exhibits normal muscle tone. Coordination normal.  Skin: Rash (a cluster of blisters R cheek) noted. No erythema.  Psychiatric: She has a normal mood and affect. Her behavior is normal. Judgment and thought content normal.  Acne, moles on face Intertrigo rash - resolved R knee flexors, extensors 5-/5 R knee DTR a little decreased  L inner mid thigh with resolved abscess - seb cyst  Lab Results  Component Value Date   WBC 6.0 09/23/2010   HGB 13.8 09/23/2010   HCT 40.2 09/23/2010   PLT 198.0 09/23/2010   GLUCOSE 110* 02/22/2013   ALT 24 10/07/2012   AST 26 10/07/2012   NA 141 02/22/2013   K 4.1 02/22/2013   CL 107 02/22/2013   CREATININE 0.7 02/22/2013   BUN 18 02/22/2013   CO2 28 02/22/2013  TSH 0.78 05/15/2010   HGBA1C 5.8 02/22/2013          Assessment & Plan:

## 2013-05-27 NOTE — Assessment & Plan Note (Signed)
Continue with current prescription therapy as reflected on the Med list.  

## 2013-05-27 NOTE — Assessment & Plan Note (Signed)
Continue with current prescription therapy as reflected on the Med list. Wt Readings from Last 3 Encounters:  05/27/13 195 lb (88.451 kg)  03/29/13 197 lb (89.359 kg)  03/18/13 197 lb (89.359 kg)

## 2013-05-27 NOTE — Assessment & Plan Note (Signed)
UA

## 2013-05-31 ENCOUNTER — Other Ambulatory Visit: Payer: Self-pay | Admitting: *Deleted

## 2013-05-31 DIAGNOSIS — N3090 Cystitis, unspecified without hematuria: Secondary | ICD-10-CM

## 2013-06-01 ENCOUNTER — Other Ambulatory Visit: Payer: PRIVATE HEALTH INSURANCE

## 2013-06-01 DIAGNOSIS — N3090 Cystitis, unspecified without hematuria: Secondary | ICD-10-CM

## 2013-06-02 ENCOUNTER — Telehealth: Payer: Self-pay

## 2013-06-02 LAB — URINE CULTURE: Colony Count: >=100000

## 2013-06-02 NOTE — Telephone Encounter (Signed)
Xanax refill was called into [facility] to # [phone].

## 2013-07-01 ENCOUNTER — Ambulatory Visit
Admission: RE | Admit: 2013-07-01 | Discharge: 2013-07-01 | Disposition: A | Discharge status: Home / Self Care | Payer: PRIVATE HEALTH INSURANCE | Source: Ambulatory Visit

## 2013-07-01 DIAGNOSIS — Z1231 Encounter for screening mammogram for malignant neoplasm of breast: Secondary | ICD-10-CM

## 2013-07-15 ENCOUNTER — Emergency Department (HOSPITAL_COMMUNITY)
Admission: EM | Admit: 2013-07-15 | Discharge: 2013-07-16 | Disposition: A | Discharge status: Home / Self Care | Payer: PRIVATE HEALTH INSURANCE | Attending: Emergency Medicine | Admitting: Emergency Medicine

## 2013-07-15 ENCOUNTER — Emergency Department (HOSPITAL_COMMUNITY): Payer: PRIVATE HEALTH INSURANCE

## 2013-07-15 ENCOUNTER — Encounter (HOSPITAL_COMMUNITY): Payer: Self-pay | Admitting: Emergency Medicine

## 2013-07-15 DIAGNOSIS — F411 Generalized anxiety disorder: Secondary | ICD-10-CM | POA: Insufficient documentation

## 2013-07-15 DIAGNOSIS — Z8679 Personal history of other diseases of the circulatory system: Secondary | ICD-10-CM | POA: Insufficient documentation

## 2013-07-15 DIAGNOSIS — F3289 Other specified depressive episodes: Secondary | ICD-10-CM | POA: Insufficient documentation

## 2013-07-15 DIAGNOSIS — J449 Chronic obstructive pulmonary disease, unspecified: Secondary | ICD-10-CM | POA: Insufficient documentation

## 2013-07-15 DIAGNOSIS — J111 Influenza due to unidentified influenza virus with other respiratory manifestations: Secondary | ICD-10-CM | POA: Insufficient documentation

## 2013-07-15 DIAGNOSIS — I10 Essential (primary) hypertension: Secondary | ICD-10-CM | POA: Insufficient documentation

## 2013-07-15 DIAGNOSIS — M1990 Unspecified osteoarthritis, unspecified site: Secondary | ICD-10-CM | POA: Insufficient documentation

## 2013-07-15 DIAGNOSIS — Z8719 Personal history of other diseases of the digestive system: Secondary | ICD-10-CM | POA: Insufficient documentation

## 2013-07-15 DIAGNOSIS — J4489 Other specified chronic obstructive pulmonary disease: Secondary | ICD-10-CM | POA: Insufficient documentation

## 2013-07-15 DIAGNOSIS — Z862 Personal history of diseases of the blood and blood-forming organs and certain disorders involving the immune mechanism: Secondary | ICD-10-CM | POA: Insufficient documentation

## 2013-07-15 DIAGNOSIS — F329 Major depressive disorder, single episode, unspecified: Secondary | ICD-10-CM | POA: Insufficient documentation

## 2013-07-15 DIAGNOSIS — Z79899 Other long term (current) drug therapy: Secondary | ICD-10-CM | POA: Insufficient documentation

## 2013-07-15 DIAGNOSIS — Z87891 Personal history of nicotine dependence: Secondary | ICD-10-CM | POA: Insufficient documentation

## 2013-07-15 DIAGNOSIS — Z8639 Personal history of other endocrine, nutritional and metabolic disease: Secondary | ICD-10-CM | POA: Insufficient documentation

## 2013-07-15 DIAGNOSIS — R69 Illness, unspecified: Secondary | ICD-10-CM

## 2013-07-15 DIAGNOSIS — Z8669 Personal history of other diseases of the nervous system and sense organs: Secondary | ICD-10-CM | POA: Insufficient documentation

## 2013-07-15 DIAGNOSIS — R509 Fever, unspecified: Secondary | ICD-10-CM | POA: Insufficient documentation

## 2013-07-15 MED ORDER — KETOROLAC TROMETHAMINE 30 MG/ML IJ SOLN
30.0000 mg | Freq: Once | INTRAMUSCULAR | Status: AC
  Administered 2013-07-15: 30 mg via INTRAVENOUS
  Filled 2013-07-15: qty 1

## 2013-07-15 MED ORDER — SODIUM CHLORIDE 0.9 % IV SOLN
1000.0000 mL | Freq: Once | INTRAVENOUS | Status: AC
  Administered 2013-07-15: 1000 mL via INTRAVENOUS

## 2013-07-15 MED ORDER — OSELTAMIVIR PHOSPHATE 75 MG PO CAPS
75.0000 mg | ORAL_CAPSULE | Freq: Once | ORAL | Status: AC
  Administered 2013-07-15: 75 mg via ORAL
  Filled 2013-07-15: qty 1

## 2013-07-15 MED ORDER — IBUPROFEN 200 MG PO TABS: 600.0000 mg | ORAL_TABLET | Freq: Once | ORAL | Status: DC

## 2013-07-15 MED ORDER — ALBUTEROL SULFATE (2.5 MG/3ML) 0.083% IN NEBU
5.0000 mg | INHALATION_SOLUTION | Freq: Once | RESPIRATORY_TRACT | Status: AC
  Administered 2013-07-15: 5 mg via RESPIRATORY_TRACT
  Filled 2013-07-15: qty 6

## 2013-07-15 MED ORDER — SODIUM CHLORIDE 0.9 % IV SOLN
1000.0000 mL | INTRAVENOUS | Status: DC
  Administered 2013-07-16: 1000 mL via INTRAVENOUS

## 2013-07-15 MED ORDER — ACETAMINOPHEN 325 MG PO TABS
650.0000 mg | ORAL_TABLET | Freq: Once | ORAL | Status: AC
  Administered 2013-07-15: 650 mg via ORAL
  Filled 2013-07-15: qty 2

## 2013-07-15 NOTE — ED Notes (Signed)
Cough; fever; body aches since yesterday

## 2013-07-16 LAB — BASIC METABOLIC PANEL
BUN: 18 mg/dL (ref 6–23)
CO2: 22 mEq/L (ref 19–32)
Calcium: 8.7 mg/dL (ref 8.4–10.5)
Chloride: 100 mEq/L (ref 96–112)
Creatinine, Ser: 0.82 mg/dL (ref 0.50–1.10)
GFR calc Af Amer: 85 mL/min — ABNORMAL LOW (ref >90)
GFR calc non Af Amer: 73 mL/min — ABNORMAL LOW (ref >90)
Glucose, Bld: 122 mg/dL — ABNORMAL HIGH (ref 70–99)
Potassium: 4 mEq/L (ref 3.7–5.3)
Sodium: 135 mEq/L — ABNORMAL LOW (ref 137–147)

## 2013-07-16 LAB — URINALYSIS, ROUTINE W REFLEX MICROSCOPIC
Glucose, UA: NEGATIVE mg/dL
Hgb urine dipstick: NEGATIVE
Ketones, ur: NEGATIVE mg/dL
Nitrite: NEGATIVE
Protein, ur: NEGATIVE mg/dL
Specific Gravity, Urine: 1.037 — ABNORMAL HIGH (ref 1.005–1.030)
Urobilinogen, UA: 1 mg/dL (ref 0.0–1.0)
pH: 5.5 (ref 5.0–8.0)

## 2013-07-16 LAB — CBC WITH DIFFERENTIAL/PLATELET
Basophils Absolute: 0 10*3/uL (ref 0.0–0.1)
Basophils Relative: 1 % (ref 0–1)
Eosinophils Absolute: 0 10*3/uL (ref 0.0–0.7)
Eosinophils Relative: 0 % (ref 0–5)
HCT: 39.3 % (ref 36.0–46.0)
Hemoglobin: 12.9 g/dL (ref 12.0–15.0)
Lymphocytes Relative: 16 % (ref 12–46)
Lymphs Abs: 1 10*3/uL (ref 0.7–4.0)
MCH: 31.6 pg (ref 26.0–34.0)
MCHC: 32.8 g/dL (ref 30.0–36.0)
MCV: 96.3 fL (ref 78.0–100.0)
Monocytes Absolute: 0.7 10*3/uL (ref 0.1–1.0)
Monocytes Relative: 11 % (ref 3–12)
Neutro Abs: 4.5 10*3/uL (ref 1.7–7.7)
Neutrophils Relative %: 72 % (ref 43–77)
Platelets: 168 10*3/uL (ref 150–400)
RBC: 4.08 MIL/uL (ref 3.87–5.11)
RDW: 12.6 % (ref 11.5–15.5)
WBC: 6.2 10*3/uL (ref 4.0–10.5)

## 2013-07-16 LAB — URINE MICROSCOPIC-ADD ON

## 2013-07-16 MED ORDER — ALBUTEROL SULFATE HFA 108 (90 BASE) MCG/ACT IN AERS
2.0000 | INHALATION_SPRAY | Freq: Four times a day (QID) | RESPIRATORY_TRACT | Status: DC
  Administered 2013-07-16: 2 via RESPIRATORY_TRACT

## 2013-07-16 MED ORDER — NALOXONE HCL 0.4 MG/ML IJ SOLN
INTRAMUSCULAR | Status: AC
  Filled 2013-07-16: qty 1

## 2013-07-16 MED ORDER — OSELTAMIVIR PHOSPHATE 75 MG PO CAPS: 75.0000 mg | ORAL_CAPSULE | Freq: Two times a day (BID) | ORAL | Status: DC

## 2013-07-16 MED ORDER — ALBUTEROL SULFATE HFA 108 (90 BASE) MCG/ACT IN AERS
INHALATION_SPRAY | RESPIRATORY_TRACT | Status: AC
  Filled 2013-07-16: qty 6.7

## 2013-07-16 MED ORDER — NALOXONE HCL 0.4 MG/ML IJ SOLN
0.4000 mg | Freq: Once | INTRAMUSCULAR | Status: AC
  Administered 2013-07-16: 0.4 mg via INTRAVENOUS

## 2013-07-16 MED ORDER — MORPHINE SULFATE 4 MG/ML IJ SOLN
4.0000 mg | Freq: Once | INTRAMUSCULAR | Status: AC
  Administered 2013-07-16: 4 mg via INTRAVENOUS
  Filled 2013-07-16: qty 1

## 2013-07-16 MED ORDER — GI COCKTAIL ~~LOC~~
30.0000 mL | Freq: Once | ORAL | Status: AC
  Administered 2013-07-16: 30 mL via ORAL
  Filled 2013-07-16: qty 30

## 2013-07-16 NOTE — Discharge Instructions (Signed)

## 2013-07-16 NOTE — ED Provider Notes (Addendum)
CSN: [id number]     Arrival date & time 07/15/13  2253 History   First MD Initiated Contact with Patient 07/15/13 2309     Chief Complaint  Patient presents with  . Fever    HPI Patient reports myalgias and coughing congestion over the past several days.  She's had diarrhea without nausea or vomiting.  She denies abdominal pain.  No dysuria or urinary frequency.  Symptoms are mild to moderate in severity.  Mild decreased oral intake today.  No recent sick contacts.   Past Medical History  Diagnosis Date  . HYPERLIPIDEMIA 04/30/2006  . ANXIETY 05/25/2006  . 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 INSOMNIA, PERSISTENT 02/22/2008  . DEPRESSION 01/02/2006  . OBSTRUCTIVE SLEEP APNEA 04/17/2010  . 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 ESOTROPIA, LEFT EYE 1946/05/16  . HYPERTENSION 05/25/2006  . BRONCHITIS, ACUTE 12/08/2007  . COPD 05/25/2006  . T M J 05/11/2009  . OSTEOARTHRITIS 05/25/2006  . LOW BACK PAIN 01/27/2007  . FIBROMYALGIA 04/02/2006  . Headache(784.0) 12/08/2007  . HYPERGLYCEMIA 05/15/2010  . Abdominal pain, epigastric 12/18/2009  . Lazy eye     left  . Polyuria   . Migraines   . Psychosomatic disease 2011   Past Surgical History  Procedure Laterality Date  . Cholecystectomy     Family History  Problem Relation Age of Onset  . Heart attack Father   . Hypertension Brother   . Heart disease Mother    History  Substance Use Topics  . Smoking status: Former Smoker    Quit date: 04/13/2010  . Smokeless tobacco: Not on file  . Alcohol Use: No   OB History   Grav Para Term Preterm Abortions TAB SAB Ect Mult Living                 Review of Systems  All other systems reviewed and are negative.    Allergies  Bupropion hcl; Duloxetine; Ezetimibe; Metformin and related; Trazodone hcl; Gabapentin; and Tramadol hcl  Home Medications   Current Outpatient Rx  Name  Route  Sig  Dispense  Refill  . ALPRAZolam (XANAX) 0.5 MG tablet   Oral   Take 1 tablet (0.5 mg total) by  mouth 2 (two) times daily.   60 tablet   2   . HYDROcodone-acetaminophen (NORCO) 7.5-325 MG per tablet   Oral   Take 1 tablet by mouth every 8 (eight) hours as needed for moderate pain.         . metoprolol succinate (TOPROL-XL) 25 MG 24 hr tablet   Oral   Take 25 mg by mouth every morning.          Marland Kitchen tiZANidine (ZANAFLEX) 4 MG tablet   Oral   Take 4 mg by mouth every 8 (eight) hours as needed (for leg pain).         Marland Kitchen oseltamivir (TAMIFLU) 75 MG capsule   Oral   Take 1 capsule (75 mg total) by mouth every 12 (twelve) hours.   10 capsule   0    BP 115/61  Pulse 77  Temp(Src) 98.3 F (36.8 C) (Oral)  Resp 16  Wt 195 lb (88.451 kg)  SpO2 96% Physical Exam  Nursing note and vitals reviewed. Constitutional: She is oriented to person, place, and time. She appears well-developed and well-nourished. No distress.  HENT:  Head: Normocephalic and atraumatic.  Eyes: EOM are normal.  Neck: Normal range of motion.  Cardiovascular: Normal rate, regular rhythm and normal heart sounds.   Pulmonary/Chest:  Effort normal and breath sounds normal.  Abdominal: Soft. She exhibits no distension. There is no tenderness.  Musculoskeletal: Normal range of motion.  Neurological: She is alert and oriented to person, place, and time.  Skin: Skin is warm and dry.  Psychiatric: She has a normal mood and affect. Judgment normal.    ED Course  Procedures (including critical care time) Labs Review Labs Reviewed  BASIC METABOLIC PANEL - Abnormal; Notable for the following:    Sodium 135 (*)    Glucose, Bld 122 (*)    GFR calc non Af Amer 73 (*)    GFR calc Af Amer 85 (*)    All other components within normal limits  URINALYSIS, ROUTINE W REFLEX MICROSCOPIC - Abnormal; Notable for the following:    Color, Urine AMBER (*)    APPearance CLOUDY (*)    Specific Gravity, Urine 1.037 (*)    Bilirubin Urine SMALL (*)    Leukocytes, UA SMALL (*)    All other components within normal limits   URINE MICROSCOPIC-ADD ON - Abnormal; Notable for the following:    Squamous Epithelial / LPF MANY (*)    Bacteria, UA MANY (*)    All other components within normal limits  URINE CULTURE  CBC WITH DIFFERENTIAL   Imaging Review Dg Chest 2 View  07/16/2013   CLINICAL DATA:  Fever.  EXAM: CHEST  2 VIEW  COMPARISON:  05/20/2012  FINDINGS: Stable heart size, accentuated by increased mediastinal fat. Stable appearance of the lower lungs. No evidence of edema or infiltrate. No effusion or pneumothorax. There is mild bronchitic changes which appear chronic. Cholecystectomy.  IMPRESSION: Bronchitic changes without consolidation or edema.   Electronically Signed   By: Jorje Guild M.D.   On: 07/16/2013 00:05  I personally reviewed the imaging tests through PACS system I reviewed available ER/hospitalization records through the EMR   EKG Interpretation    Date/Time:  Saturday July 16 2013 02:04:43 EST Ventricular Rate:  82 PR Interval:  169 QRS Duration: 92 QT Interval:  389 QTC Calculation: 454 R Axis:   -15 Text Interpretation:  Normal sinus rhythm No significant change was found Confirmed by Vernice Mannina  MD, Caliber Landess ([id number]) on 07/16/2013 5:18:07 AM            MDM   1. Influenza-like illness   2. Fever    4:08 AM Patient feels much better at this time.  Her myalgias improve with IV fluids and portal.  She was having a mild headache and therefore she was given some morphine.  This resulted in developing upper Donald pain shortly after the morphine.  She states this has happened before with morphine.  She states that the last time this occurred they gave her a reversal agent which seemed to improve her symptoms.  I gave the patient 0.4 mg of Narcan which completely resolved her upper abdominal discomfort.  Repeat abdominal exam is benign.  Discharge home in good condition.  PCP followup ear she understands to return to the ER for new or worsening symptoms    Hoy Morn, MD 07/16/13  [place], MD 07/16/13 [phone]

## 2013-07-17 LAB — URINE CULTURE: Colony Count: 60000

## 2013-07-29 ENCOUNTER — Ambulatory Visit (INDEPENDENT_AMBULATORY_CARE_PROVIDER_SITE_OTHER): Payer: PRIVATE HEALTH INSURANCE | Admitting: Internal Medicine

## 2013-07-29 ENCOUNTER — Encounter: Payer: Self-pay | Admitting: Internal Medicine

## 2013-07-29 VITALS — BP 128/76 | HR 76 | Temp 97.0°F | Resp 16 | Wt 195.0 lb

## 2013-07-29 DIAGNOSIS — M545 Low back pain, unspecified: Secondary | ICD-10-CM

## 2013-07-29 DIAGNOSIS — N3090 Cystitis, unspecified without hematuria: Secondary | ICD-10-CM

## 2013-07-29 DIAGNOSIS — R739 Hyperglycemia, unspecified: Secondary | ICD-10-CM

## 2013-07-29 DIAGNOSIS — I10 Essential (primary) hypertension: Secondary | ICD-10-CM

## 2013-07-29 DIAGNOSIS — IMO0001 Reserved for inherently not codable concepts without codable children: Secondary | ICD-10-CM

## 2013-07-29 DIAGNOSIS — R7309 Other abnormal glucose: Secondary | ICD-10-CM

## 2013-07-29 DIAGNOSIS — R05 Cough: Secondary | ICD-10-CM

## 2013-07-29 DIAGNOSIS — R059 Cough, unspecified: Secondary | ICD-10-CM

## 2013-07-29 MED ORDER — ALPRAZOLAM 0.5 MG PO TABS: 0.5000 mg | ORAL_TABLET | Freq: Two times a day (BID) | ORAL | Status: DC

## 2013-07-29 MED ORDER — METOPROLOL SUCCINATE ER 25 MG PO TB24: 25.0000 mg | ORAL_TABLET | Freq: Every morning | ORAL | Status: DC

## 2013-07-29 MED ORDER — HYDROCODONE-ACETAMINOPHEN 7.5-325 MG PO TABS: 1.0000 | ORAL_TABLET | Freq: Three times a day (TID) | ORAL | Status: DC | PRN

## 2013-07-29 NOTE — Assessment & Plan Note (Signed)
Continue with current prescription therapy as reflected on the Med list.  

## 2013-07-29 NOTE — Assessment & Plan Note (Signed)
UA

## 2013-07-29 NOTE — Assessment & Plan Note (Signed)
Better  

## 2013-07-29 NOTE — Progress Notes (Signed)
Subjective:  F/u pain in R shoulder blade w/ROM  - better Overall doing better C/o urine being thick  Leg Pain  The incident occurred more than 1 week ago (3 wks). There was no injury mechanism. The pain is present in the right thigh, right hip and right knee. The quality of the pain is described as burning. The pain is at a severity of 3/10. The pain is moderate. The pain has been constant since onset. The symptoms are aggravated by movement and weight bearing. She has tried rest and NSAIDs for the symptoms. The treatment provided mild relief.   R side pain (R buttock,hip, groin) is 3/10 worse w/ROM, better w/rest Worse She went to ER on 9/7  F/u a L inner thigh cyst -resolved  The patient is here to follow up on chronic HTN- better, depression, anxiety, headaches and chronic moderate fibromyalgia/LBP symptoms.  She stopped Glucophage XR - diarrhea   Wt Readings from Last 3 Encounters:  07/29/13 195 lb (88.451 kg)  07/15/13 195 lb (88.451 kg)  05/27/13 195 lb (88.451 kg)   BP Readings from Last 3 Encounters:  07/29/13 128/76  07/16/13 115/61  05/27/13 104/60      Review of Systems  Constitutional: Positive for unexpected weight change. Negative for chills, activity change and appetite change.  HENT: Negative for mouth sores.   Eyes: Negative for visual disturbance.  Respiratory: Negative for chest tightness.   Genitourinary: Negative for frequency, difficulty urinating and vaginal pain.  Musculoskeletal: Positive for arthralgias. Negative for gait problem.  Skin: Negative for pallor.  Neurological: Negative for tremors.  Psychiatric/Behavioral: Positive for sleep disturbance. Negative for suicidal ideas and confusion. The patient is nervous/anxious.         Objective:   Physical Exam  Constitutional: She appears well-developed. No distress.  obese  HENT:  Head: Normocephalic.  Right Ear: External ear normal.  Left Ear: External ear normal.  Nose: Nose normal.   Mouth/Throat: Oropharynx is clear and moist.  Eyes: Conjunctivae are normal. Pupils are equal, round, and reactive to light. Right eye exhibits no discharge. Left eye exhibits no discharge.  Neck: Normal range of motion. Neck supple. No JVD present. No tracheal deviation present. No thyromegaly present.  Cardiovascular: Normal rate, regular rhythm and normal heart sounds.   Pulmonary/Chest: No stridor. No respiratory distress. She has no wheezes.  Abdominal: Soft. Bowel sounds are normal. She exhibits no distension and no mass. There is no tenderness. There is no rebound and no guarding.  Musculoskeletal: She exhibits tenderness (L groin w/palp and ROM). She exhibits no edema.  Lymphadenopathy:    She has no cervical adenopathy.  Neurological: She displays normal reflexes. No cranial nerve deficit. She exhibits normal muscle tone. Coordination normal.  Skin: Rash (a cluster of blisters R cheek) noted. No erythema.  Psychiatric: She has a normal mood and affect. Her behavior is normal. Judgment and thought content normal.  Acne, moles on face Intertrigo rash - resolved R knee flexors, extensors 5-/5 R knee DTR a little decreased    Lab Results  Component Value Date   WBC 6.2 07/15/2013   HGB 12.9 07/15/2013   HCT 39.3 07/15/2013   PLT 168 07/15/2013   GLUCOSE 122* 07/15/2013   ALT 24 10/07/2012   AST 26 10/07/2012   NA 135* 07/15/2013   K 4.0 07/15/2013   CL 100 07/15/2013   CREATININE 0.82 07/15/2013   BUN 18 07/15/2013   CO2 22 07/15/2013   TSH 0.78 05/15/2010  HGBA1C 5.8 05/27/2013          Assessment & Plan:

## 2013-07-29 NOTE — Progress Notes (Signed)
Pre visit review using our clinic review tool, if applicable. No additional management support is needed unless otherwise documented below in the visit note. 

## 2013-09-30 ENCOUNTER — Ambulatory Visit (INDEPENDENT_AMBULATORY_CARE_PROVIDER_SITE_OTHER): Payer: PRIVATE HEALTH INSURANCE | Admitting: Internal Medicine

## 2013-09-30 ENCOUNTER — Encounter: Payer: Self-pay | Admitting: Internal Medicine

## 2013-09-30 VITALS — BP 122/66 | HR 84 | Temp 98.1°F | Resp 16 | Wt 202.0 lb

## 2013-09-30 DIAGNOSIS — M545 Low back pain, unspecified: Secondary | ICD-10-CM

## 2013-09-30 DIAGNOSIS — F329 Major depressive disorder, single episode, unspecified: Secondary | ICD-10-CM

## 2013-09-30 DIAGNOSIS — F411 Generalized anxiety disorder: Secondary | ICD-10-CM

## 2013-09-30 DIAGNOSIS — F3289 Other specified depressive episodes: Secondary | ICD-10-CM

## 2013-09-30 DIAGNOSIS — IMO0001 Reserved for inherently not codable concepts without codable children: Secondary | ICD-10-CM

## 2013-09-30 DIAGNOSIS — F450 Somatization disorder: Secondary | ICD-10-CM

## 2013-09-30 MED ORDER — METOPROLOL SUCCINATE ER 25 MG PO TB24: 25.0000 mg | ORAL_TABLET | Freq: Every morning | ORAL | Status: DC

## 2013-09-30 MED ORDER — ALPRAZOLAM 0.5 MG PO TABS: 0.5000 mg | ORAL_TABLET | Freq: Two times a day (BID) | ORAL | Status: DC

## 2013-09-30 MED ORDER — HYDROCODONE-ACETAMINOPHEN 7.5-325 MG PO TABS: 1.0000 | ORAL_TABLET | Freq: Three times a day (TID) | ORAL | Status: DC | PRN

## 2013-09-30 NOTE — Progress Notes (Signed)
Pre visit review using our clinic review tool, if applicable. No additional management support is needed unless otherwise documented below in the visit note. 

## 2013-09-30 NOTE — Progress Notes (Signed)
   Subjective:  C/o CP off and on F/u pain in R shoulder blade w/ROM  - better Overall doing OK C/o urine being thick and "strong" Quit smoking 1/15  HPI R side pain (R buttock,hip, groin) is 3/10 worse w/ROM, better w/rest     The patient is here to follow up on chronic HTN- better, depression, anxiety, headaches and chronic moderate fibromyalgia/LBP symptoms.  She stopped Glucophage XR - diarrhea   Wt Readings from Last 3 Encounters:  09/30/13 202 lb (91.627 kg)  07/29/13 195 lb (88.451 kg)  07/15/13 195 lb (88.451 kg)   BP Readings from Last 3 Encounters:  09/30/13 122/66  07/29/13 128/76  07/16/13 115/61      Review of Systems  Constitutional: Positive for unexpected weight change. Negative for chills, activity change and appetite change.  HENT: Negative for mouth sores.   Eyes: Negative for visual disturbance.  Respiratory: Negative for chest tightness.   Genitourinary: Negative for frequency, difficulty urinating and vaginal pain.  Musculoskeletal: Positive for arthralgias. Negative for gait problem.  Skin: Negative for pallor.  Neurological: Negative for tremors.  Psychiatric/Behavioral: Positive for sleep disturbance. Negative for suicidal ideas and confusion. The patient is nervous/anxious.         Objective:   Physical Exam  Constitutional: She appears well-developed. No distress.  obese  HENT:  Head: Normocephalic.  Right Ear: External ear normal.  Left Ear: External ear normal.  Nose: Nose normal.  Mouth/Throat: Oropharynx is clear and moist.  Eyes: Conjunctivae are normal. Pupils are equal, round, and reactive to light. Right eye exhibits no discharge. Left eye exhibits no discharge.  Neck: Normal range of motion. Neck supple. No JVD present. No tracheal deviation present. No thyromegaly present.  Cardiovascular: Normal rate, regular rhythm and normal heart sounds.   Pulmonary/Chest: No stridor. No respiratory distress. She has no wheezes.   Abdominal: Soft. Bowel sounds are normal. She exhibits no distension and no mass. There is no tenderness. There is no rebound and no guarding.  Musculoskeletal: She exhibits tenderness (L groin w/palp and ROM). She exhibits no edema.  Lymphadenopathy:    She has no cervical adenopathy.  Neurological: She displays normal reflexes. No cranial nerve deficit. She exhibits normal muscle tone. Coordination normal.  Skin: Rash (a cluster of blisters R cheek) noted. No erythema.  Psychiatric: She has a normal mood and affect. Her behavior is normal. Judgment and thought content normal.  Acne, moles on face Intertrigo rash - resolved R knee flexors, extensors 5-/5 R knee DTR a little decreased    Lab Results  Component Value Date   WBC 6.2 07/15/2013   HGB 12.9 07/15/2013   HCT 39.3 07/15/2013   PLT 168 07/15/2013   GLUCOSE 122* 07/15/2013   ALT 24 10/07/2012   AST 26 10/07/2012   NA 135* 07/15/2013   K 4.0 07/15/2013   CL 100 07/15/2013   CREATININE 0.82 07/15/2013   BUN 18 07/15/2013   CO2 22 07/15/2013   TSH 0.78 05/15/2010   HGBA1C 5.8 05/27/2013          Assessment & Plan:

## 2013-09-30 NOTE — Assessment & Plan Note (Signed)
Continue with current prescription therapy as reflected on the Med list.  

## 2013-10-05 ENCOUNTER — Other Ambulatory Visit (INDEPENDENT_AMBULATORY_CARE_PROVIDER_SITE_OTHER): Payer: PRIVATE HEALTH INSURANCE

## 2013-10-05 DIAGNOSIS — M545 Low back pain, unspecified: Secondary | ICD-10-CM

## 2013-10-05 DIAGNOSIS — R7309 Other abnormal glucose: Secondary | ICD-10-CM

## 2013-10-05 DIAGNOSIS — F450 Somatization disorder: Secondary | ICD-10-CM

## 2013-10-05 DIAGNOSIS — F3289 Other specified depressive episodes: Secondary | ICD-10-CM

## 2013-10-05 DIAGNOSIS — F329 Major depressive disorder, single episode, unspecified: Secondary | ICD-10-CM

## 2013-10-05 DIAGNOSIS — IMO0001 Reserved for inherently not codable concepts without codable children: Secondary | ICD-10-CM

## 2013-10-05 DIAGNOSIS — F411 Generalized anxiety disorder: Secondary | ICD-10-CM

## 2013-10-05 LAB — BASIC METABOLIC PANEL
BUN: 18 mg/dL (ref 6–23)
CO2: 26 mEq/L (ref 19–32)
Calcium: 9 mg/dL (ref 8.4–10.5)
Chloride: 107 mEq/L (ref 96–112)
Creatinine, Ser: 0.7 mg/dL (ref 0.4–1.2)
GFR: 92.17 mL/min (ref >60.00)
Glucose, Bld: 116 mg/dL — ABNORMAL HIGH (ref 70–99)
Potassium: 3.9 mEq/L (ref 3.5–5.1)
Sodium: 139 mEq/L (ref 135–145)

## 2013-10-05 LAB — URINALYSIS
Hgb urine dipstick: NEGATIVE
Ketones, ur: NEGATIVE
Leukocytes, UA: NEGATIVE
Nitrite: NEGATIVE
Specific Gravity, Urine: >=1.03 — AB (ref 1.000–1.030)
Urine Glucose: NEGATIVE
Urobilinogen, UA: 0.2 (ref 0.0–1.0)
pH: 6 (ref 5.0–8.0)

## 2013-10-05 LAB — HEPATIC FUNCTION PANEL
ALT: 26 U/L (ref 0–35)
AST: 26 U/L (ref 0–37)
Albumin: 4.1 g/dL (ref 3.5–5.2)
Alkaline Phosphatase: 74 U/L (ref 39–117)
Bilirubin, Direct: 0.1 mg/dL (ref 0.0–0.3)
Total Bilirubin: 0.8 mg/dL (ref 0.3–1.2)
Total Protein: 6.9 g/dL (ref 6.0–8.3)

## 2013-10-05 LAB — HEMOGLOBIN A1C: Hgb A1c MFr Bld: 6 % (ref 4.6–6.5)

## 2013-10-05 LAB — TSH: TSH: 0.38 u[IU]/mL (ref 0.35–5.50)

## 2013-10-06 ENCOUNTER — Telehealth: Payer: Self-pay | Admitting: *Deleted

## 2013-10-06 NOTE — Telephone Encounter (Signed)
I called pt and informed her of her UDS and lab results. I advised her to keep her appt as scheduled. Pt understands.

## 2013-10-13 ENCOUNTER — Encounter: Payer: Self-pay | Admitting: Internal Medicine

## 2013-10-13 ENCOUNTER — Ambulatory Visit (INDEPENDENT_AMBULATORY_CARE_PROVIDER_SITE_OTHER): Payer: PRIVATE HEALTH INSURANCE | Admitting: Internal Medicine

## 2013-10-13 VITALS — BP 126/70 | HR 75 | Temp 98.3°F | Resp 14 | Wt 199.0 lb

## 2013-10-13 DIAGNOSIS — L03119 Cellulitis of unspecified part of limb: Secondary | ICD-10-CM

## 2013-10-13 DIAGNOSIS — L03116 Cellulitis of left lower limb: Secondary | ICD-10-CM

## 2013-10-13 DIAGNOSIS — L02419 Cutaneous abscess of limb, unspecified: Secondary | ICD-10-CM

## 2013-10-13 MED ORDER — CEPHALEXIN 500 MG PO CAPS: 500.0000 mg | ORAL_CAPSULE | Freq: Two times a day (BID) | ORAL | Status: DC

## 2013-10-13 MED ORDER — MUPIROCIN 2 % EX OINT: TOPICAL_OINTMENT | CUTANEOUS | Status: DC

## 2013-10-13 NOTE — Patient Instructions (Signed)
Use Eucerin   twice a day  for the dry nasal septum. The drying & inflammation cause nose bleeds.   Use warm moist compresses 2 to 3 times a day to the affected area.

## 2013-10-13 NOTE — Progress Notes (Signed)
Pre visit review using our clinic review tool, if applicable. No additional management support is needed unless otherwise documented below in the visit note. 

## 2013-10-13 NOTE — Progress Notes (Signed)
   Subjective:    Patient ID: Amanda Arnold, female    DOB: Jan 23, 1946, 69 y.o.   MRN: [id number]  HPI  She noted a knot in the upper, posterior right thigh. It was out of her field of vision . She noted it while bathing.  She is unsure whether this has changed. It is not been associated with fever, chills, or sweats.  She had a history of abscess of the left  medial thigh. She has no history of MRSA  She rarely checks her sugars; her most recent A1c was 6%, in the nondiabetic range.    Review of Systems Epistaxis this am. She denies  hemoptysis, hematuria, melena, or rectal bleeding.has no unexplained weight loss, dysphagia, or abdominal pain. No persistently small caliber stools  She has no abnormal bruising or bleeding.She has no difficulty stopping bleeding with injury off ASA.     Objective:   Physical Exam General appearance:adequately nourished; no acute distress or increased work of breathing is present.  No  lymphadenopathy about the head, neck, or axilla noted.   Eyes: Alternating  esotropia. There is no scleral icterus.   Nose:  External nasal examination shows no deformity or inflammation. Nasal mucosa are dry without lesions or exudates. No septal dislocation or deviation.No obstruction to airflow.   Oral exam: Dental hygiene is poor;upper plate; lips and gums are healthy appearing.There is no oropharyngeal erythema or exudate noted.      Heart:  Normal rate and regular rhythm. S1 and S2 normal without gallop, murmur, click, rub or other extra sounds.   Lungs:Chest clear to auscultation; no wheezes, rhonchi,rales ,or rubs present.No increased work of breathing.    Extremities:  No cyanosis, edema, or clubbing  noted    Skin: Warm & dry w/o jaundice or tenting. 2 x 2 cm area of erythema and abscess without associated pustule light upper, posterior thigh area         Assessment & Plan:  #1 cellulitis/abscess thigh  #2 epistaxis from nasal drying  See  recommendations

## 2013-10-20 ENCOUNTER — Other Ambulatory Visit: Payer: Self-pay

## 2013-10-26 ENCOUNTER — Encounter: Payer: Self-pay | Admitting: Internal Medicine

## 2013-10-29 ENCOUNTER — Emergency Department (HOSPITAL_COMMUNITY)
Admission: EM | Admit: 2013-10-29 | Discharge: 2013-10-29 | Disposition: A | Discharge status: Home / Self Care | Payer: PRIVATE HEALTH INSURANCE | Attending: Emergency Medicine | Admitting: Emergency Medicine

## 2013-10-29 ENCOUNTER — Encounter (HOSPITAL_COMMUNITY): Payer: Self-pay | Admitting: Emergency Medicine

## 2013-10-29 ENCOUNTER — Emergency Department (HOSPITAL_COMMUNITY): Payer: PRIVATE HEALTH INSURANCE

## 2013-10-29 DIAGNOSIS — Z792 Long term (current) use of antibiotics: Secondary | ICD-10-CM | POA: Insufficient documentation

## 2013-10-29 DIAGNOSIS — J449 Chronic obstructive pulmonary disease, unspecified: Secondary | ICD-10-CM | POA: Insufficient documentation

## 2013-10-29 DIAGNOSIS — M1990 Unspecified osteoarthritis, unspecified site: Secondary | ICD-10-CM | POA: Insufficient documentation

## 2013-10-29 DIAGNOSIS — F411 Generalized anxiety disorder: Secondary | ICD-10-CM | POA: Insufficient documentation

## 2013-10-29 DIAGNOSIS — Z862 Personal history of diseases of the blood and blood-forming organs and certain disorders involving the immune mechanism: Secondary | ICD-10-CM | POA: Insufficient documentation

## 2013-10-29 DIAGNOSIS — Z8639 Personal history of other endocrine, nutritional and metabolic disease: Secondary | ICD-10-CM | POA: Insufficient documentation

## 2013-10-29 DIAGNOSIS — I10 Essential (primary) hypertension: Secondary | ICD-10-CM | POA: Insufficient documentation

## 2013-10-29 DIAGNOSIS — R071 Chest pain on breathing: Secondary | ICD-10-CM | POA: Insufficient documentation

## 2013-10-29 DIAGNOSIS — H53009 Unspecified amblyopia, unspecified eye: Secondary | ICD-10-CM | POA: Insufficient documentation

## 2013-10-29 DIAGNOSIS — H5000 Unspecified esotropia: Secondary | ICD-10-CM | POA: Insufficient documentation

## 2013-10-29 DIAGNOSIS — Z8719 Personal history of other diseases of the digestive system: Secondary | ICD-10-CM | POA: Insufficient documentation

## 2013-10-29 DIAGNOSIS — J4489 Other specified chronic obstructive pulmonary disease: Secondary | ICD-10-CM | POA: Insufficient documentation

## 2013-10-29 DIAGNOSIS — Z87891 Personal history of nicotine dependence: Secondary | ICD-10-CM | POA: Insufficient documentation

## 2013-10-29 DIAGNOSIS — G479 Sleep disorder, unspecified: Secondary | ICD-10-CM | POA: Insufficient documentation

## 2013-10-29 DIAGNOSIS — R0789 Other chest pain: Secondary | ICD-10-CM

## 2013-10-29 DIAGNOSIS — Z79899 Other long term (current) drug therapy: Secondary | ICD-10-CM | POA: Insufficient documentation

## 2013-10-29 MED ORDER — CYCLOBENZAPRINE HCL 10 MG PO TABS: 10.0000 mg | ORAL_TABLET | Freq: Two times a day (BID) | ORAL | Status: DC | PRN

## 2013-10-29 NOTE — ED Provider Notes (Signed)
Medical screening examination/treatment/procedure(s) were performed by non-physician practitioner and as supervising physician I was immediately available for consultation/collaboration.   EKG Interpretation None       [name], MD 10/29/13 [phone]

## 2013-10-29 NOTE — ED Provider Notes (Signed)
CSN: [id number]     Arrival date & time 10/29/13  1026 History  This chart was scribed for non-physician practitioner working with Amanda Dakin, MD by Stacy Gardner, ED scribe. This patient was seen in room TR08C/TR08C and the patient's care was started at 11:24 AM.   None    Chief Complaint  Patient presents with  . Rib Injury     (Consider location/radiation/quality/duration/timing/severity/associated sxs/prior Treatment) The history is provided by the patient and medical records. No language interpreter was used.   HPI Comments: Amanda Arnold is a 68 y.o. female who presents to the Emergency Department complaining of rib injury, onset one week. Denies injury. Pt also has pain to her right breast area. She was unable to sleep due to pain. The pain is worse with coughing, movement, deep breaths, pressure and sneezing. She has tried . Nothing seems to make her symptoms better. Pt has a hx of COPD.  Past Medical History  Diagnosis Date  . HYPERLIPIDEMIA 04/30/2006  . ANXIETY 05/25/2006  . 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 INSOMNIA, PERSISTENT 02/22/2008  . DEPRESSION 01/02/2006  . OBSTRUCTIVE SLEEP APNEA 04/17/2010  . 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 ESOTROPIA, LEFT EYE 03/02/48  . HYPERTENSION 05/25/2006  . BRONCHITIS, ACUTE 12/08/2007  . COPD 05/25/2006  . T M J 05/11/2009  . OSTEOARTHRITIS 05/25/2006  . LOW BACK PAIN 01/27/2007  . FIBROMYALGIA 04/02/2006  . Headache(784.0) 12/08/2007  . HYPERGLYCEMIA 05/15/2010  . Abdominal pain, epigastric 12/18/2009  . Lazy eye     left  . Polyuria   . Migraines   . Psychosomatic disease 2011   Past Surgical History  Procedure Laterality Date  . Cholecystectomy     Family History  Problem Relation Age of Onset  . Heart attack Father   . Hypertension Brother   . Heart disease Mother    History  Substance Use Topics  . Smoking status: Former Smoker    Quit date: 07/10/2013  . Smokeless tobacco: Not on file     Comment:  smoked 1965-2014, up to 1 ppd  . Alcohol Use: No   OB History   Grav Para Term Preterm Abortions TAB SAB Ect Mult Living                 Review of Systems  Musculoskeletal: Positive for arthralgias and myalgias.       Right rib pain near breast area  All other systems reviewed and are negative.     Allergies  Morphine and related; Bupropion hcl; Duloxetine; Ezetimibe; Metformin and related; Trazodone hcl; Gabapentin; and Tramadol hcl  Home Medications   Prior to Admission medications   Medication Sig Start Date End Date Taking? Authorizing Provider  ALPRAZolam Duanne Moron) 0.5 MG tablet Take 1 tablet (0.5 mg total) by mouth 2 (two) times daily. 09/30/13   Evie Lacks Plotnikov, MD  cephALEXin (KEFLEX) 500 MG capsule Take 1 capsule (500 mg total) by mouth 2 (two) times daily. 10/13/13   Hendricks Limes, MD  HYDROcodone-acetaminophen (NORCO) 7.5-325 MG per tablet Take 1 tablet by mouth every 8 (eight) hours as needed for moderate pain or severe pain. Please fill on or after 10/31/13 09/30/13   Evie Lacks Plotnikov, MD  metoprolol succinate (TOPROL-XL) 25 MG 24 hr tablet Take 1 tablet (25 mg total) by mouth every morning. 09/30/13   Evie Lacks Plotnikov, MD  mupirocin ointment (BACTROBAN) 2 % Applied twice a day to the affected area;NOT into eyes. 10/13/13   Hendricks Limes, MD  oseltamivir (TAMIFLU) 75 MG capsule Take 1 capsule (75 mg total) by mouth every 12 (twelve) hours. 07/16/13   Hoy Morn, MD  tiZANidine (ZANAFLEX) 4 MG tablet Take 4 mg by mouth every 8 (eight) hours as needed (for leg pain). 04/05/13   Evie Lacks Plotnikov, MD   BP 125/63  Pulse 77  Temp(Src) 98.2 F (36.8 C) (Oral)  Resp 22  Ht 5\' 2"  (1.575 m)  Wt 204 lb (92.534 kg)  BMI 37.30 kg/m2  SpO2 97% Physical Exam  Nursing note and vitals reviewed. Constitutional: She is oriented to person, place, and time. She appears well-developed and well-nourished. No distress.  HENT:  Head: Normocephalic and atraumatic.  Eyes: EOM  are normal. Pupils are equal, round, and reactive to light.  Neck: Normal range of motion. Neck supple. No tracheal deviation present.  Cardiovascular: Normal rate.   Pulmonary/Chest: Effort normal. No respiratory distress. She has no wheezes. She has no rales. She exhibits no tenderness.  Abdominal: Soft. She exhibits no distension. There is no tenderness.  Musculoskeletal: She exhibits tenderness.  Right lower anterior rib tender to palpation  No obvious deformity  Neurological: She is alert and oriented to person, place, and time.  Skin: Skin is warm and dry.  Psychiatric: She has a normal mood and affect. Her behavior is normal.    ED Course  Procedures (including critical care time) DIAGNOSTIC STUDIES: Oxygen Saturation is 97% on room air, normal by my interpretation.    COORDINATION OF CARE:  11:26 AM Discussed course of care with pt which includes taking her pain medication from her present prescription and x-ray. Pt understands and agrees.    Labs Review Labs Reviewed - No data to display  Imaging Review Dg Ribs Unilateral W/chest Right  10/29/2013   CLINICAL DATA:  Chest pain.  COPD.  EXAM: RIGHT RIBS AND CHEST - 3+ VIEW  COMPARISON:  07/15/2013.  FINDINGS: Cardiomegaly. Mild bronchitic change. No active infiltrates or failure. Negative osseous structures. No visible rib fracture. No effusion or pneumothorax. Cholecystectomy. Similar appearance to priors.  IMPRESSION: Cardiomegaly. Mild bronchitic change. No active infiltrates or failure. No rib fracture.   Electronically Signed   By: Amanda Arnold M.D.   On: 10/29/2013 11:22     EKG Interpretation None      MDM   Final diagnoses:  Chest wall pain    11:32 AM Patient's chest xray unremarkable for acute changes. Patient has reproducible chest wall pain on the right side. Patient's vitals stable and patient afebrile. Patient has no risk factors for PE. Pain is worse with movement. Patient likely has muscle pain.  Patient advised to follow up with PCP or return to the ED with worsening or concerning symptoms.   I personally performed the services described in this documentation, which was scribed in my presence. The recorded information has been reviewed and is accurate.    Amanda Arnold Chou, PA-C 10/29/13 1134

## 2013-10-29 NOTE — ED Notes (Signed)
Per pt sts 1 week of right rib pain around right breat area. sts she has been coughing. sts hurts when she coughs, sneezes, moves. Denies injury

## 2013-10-29 NOTE — Discharge Instructions (Signed)
Take your pain medication as needed for pain. Refer to attached documents for more information. Follow up with your doctor as needed. Return to the ED with worsening or concerning symptoms.

## 2014-01-06 ENCOUNTER — Ambulatory Visit (INDEPENDENT_AMBULATORY_CARE_PROVIDER_SITE_OTHER): Payer: PRIVATE HEALTH INSURANCE | Admitting: Internal Medicine

## 2014-01-06 ENCOUNTER — Encounter: Payer: Self-pay | Admitting: Internal Medicine

## 2014-01-06 VITALS — BP 132/70 | HR 80 | Temp 98.4°F | Resp 16 | Wt 201.0 lb

## 2014-01-06 DIAGNOSIS — M2550 Pain in unspecified joint: Secondary | ICD-10-CM

## 2014-01-06 DIAGNOSIS — I10 Essential (primary) hypertension: Secondary | ICD-10-CM

## 2014-01-06 DIAGNOSIS — M545 Low back pain, unspecified: Secondary | ICD-10-CM

## 2014-01-06 DIAGNOSIS — G4700 Insomnia, unspecified: Secondary | ICD-10-CM

## 2014-01-06 DIAGNOSIS — IMO0001 Reserved for inherently not codable concepts without codable children: Secondary | ICD-10-CM

## 2014-01-06 DIAGNOSIS — F3289 Other specified depressive episodes: Secondary | ICD-10-CM

## 2014-01-06 DIAGNOSIS — E119 Type 2 diabetes mellitus without complications: Secondary | ICD-10-CM

## 2014-01-06 DIAGNOSIS — F329 Major depressive disorder, single episode, unspecified: Secondary | ICD-10-CM

## 2014-01-06 MED ORDER — PAROXETINE HCL 10 MG PO TABS: 10.0000 mg | ORAL_TABLET | Freq: Every day | ORAL | Status: DC

## 2014-01-06 NOTE — Assessment & Plan Note (Signed)
Continue with current prescription therapy as reflected on the Med list.  

## 2014-01-06 NOTE — Assessment & Plan Note (Signed)
Chronic  FMS/OA

## 2014-01-06 NOTE — Progress Notes (Signed)
   Subjective:  C/o aches and pains and HAs  C/o CP off and on F/u pain in R shoulder blade w/ROM  - better Overall doing OK C/o urine being thick and "strong" Quit smoking 1/15  HPI      The patient is here to follow up on chronic HTN- better, depression, anxiety, headaches and chronic moderate fibromyalgia/LBP symptoms.  She stopped Glucophage XR - diarrhea   Wt Readings from Last 3 Encounters:  01/06/14 201 lb (91.173 kg)  10/29/13 204 lb (92.534 kg)  10/13/13 199 lb (90.266 kg)   BP Readings from Last 3 Encounters:  01/06/14 132/70  10/29/13 148/69  10/13/13 126/70      Review of Systems  Constitutional: Positive for unexpected weight change. Negative for chills, activity change and appetite change.  HENT: Negative for mouth sores.   Eyes: Negative for visual disturbance.  Respiratory: Negative for chest tightness.   Genitourinary: Negative for frequency, difficulty urinating and vaginal pain.  Musculoskeletal: Positive for arthralgias. Negative for gait problem.  Skin: Negative for pallor.  Neurological: Negative for tremors.  Psychiatric/Behavioral: Positive for sleep disturbance. Negative for suicidal ideas and confusion. The patient is nervous/anxious.         Objective:   Physical Exam  Constitutional: She appears well-developed. No distress.  obese  HENT:  Head: Normocephalic.  Right Ear: External ear normal.  Left Ear: External ear normal.  Nose: Nose normal.  Mouth/Throat: Oropharynx is clear and moist.  Eyes: Conjunctivae are normal. Pupils are equal, round, and reactive to light. Right eye exhibits no discharge. Left eye exhibits no discharge.  Neck: Normal range of motion. Neck supple. No JVD present. No tracheal deviation present. No thyromegaly present.  Cardiovascular: Normal rate, regular rhythm and normal heart sounds.   Pulmonary/Chest: No stridor. No respiratory distress. She has no wheezes.  Abdominal: Soft. Bowel sounds are normal.  She exhibits no distension and no mass. There is no tenderness. There is no rebound and no guarding.  Musculoskeletal: She exhibits tenderness (L groin w/palp and ROM). She exhibits no edema.  Lymphadenopathy:    She has no cervical adenopathy.  Neurological: She displays normal reflexes. No cranial nerve deficit. She exhibits normal muscle tone. Coordination normal.  Skin: Rash (a cluster of blisters R cheek) noted. No erythema.  Psychiatric: She has a normal mood and affect. Her behavior is normal. Judgment and thought content normal.  Acne, moles on face Intertrigo rash - resolved R knee flexors, extensors 5-/5 R knee DTR a little decreased    Lab Results  Component Value Date   WBC 6.2 07/15/2013   HGB 12.9 07/15/2013   HCT 39.3 07/15/2013   PLT 168 07/15/2013   GLUCOSE 116* 10/05/2013   ALT 26 10/05/2013   AST 26 10/05/2013   NA 139 10/05/2013   K 3.9 10/05/2013   CL 107 10/05/2013   CREATININE 0.7 10/05/2013   BUN 18 10/05/2013   CO2 26 10/05/2013   TSH 0.38 10/05/2013   HGBA1C 6.0 10/05/2013          Assessment & Plan:

## 2014-01-06 NOTE — Progress Notes (Signed)
Pre visit review using our clinic review tool, if applicable. No additional management support is needed unless otherwise documented below in the visit note. 

## 2014-01-06 NOTE — Assessment & Plan Note (Signed)
No change in sx's 

## 2014-01-09 ENCOUNTER — Telehealth: Payer: Self-pay | Admitting: Internal Medicine

## 2014-01-09 NOTE — Telephone Encounter (Signed)
Relevant patient education assigned to patient using Emmi. ° °

## 2014-01-16 ENCOUNTER — Telehealth: Payer: Self-pay | Admitting: *Deleted

## 2014-01-16 DIAGNOSIS — E119 Type 2 diabetes mellitus without complications: Secondary | ICD-10-CM

## 2014-01-16 NOTE — Telephone Encounter (Signed)
Lipid panel ordered. Diabetic bundle.

## 2014-02-16 ENCOUNTER — Other Ambulatory Visit: Payer: Self-pay | Admitting: Internal Medicine

## 2014-03-03 ENCOUNTER — Ambulatory Visit (INDEPENDENT_AMBULATORY_CARE_PROVIDER_SITE_OTHER): Payer: PRIVATE HEALTH INSURANCE | Admitting: Internal Medicine

## 2014-03-03 ENCOUNTER — Encounter: Payer: Self-pay | Admitting: Internal Medicine

## 2014-03-03 VITALS — BP 122/72 | HR 75 | Temp 98.0°F | Resp 20 | Ht 62.0 in | Wt 204.0 lb

## 2014-03-03 DIAGNOSIS — F3289 Other specified depressive episodes: Secondary | ICD-10-CM

## 2014-03-03 DIAGNOSIS — E119 Type 2 diabetes mellitus without complications: Secondary | ICD-10-CM

## 2014-03-03 DIAGNOSIS — F329 Major depressive disorder, single episode, unspecified: Secondary | ICD-10-CM

## 2014-03-03 DIAGNOSIS — M545 Low back pain, unspecified: Secondary | ICD-10-CM

## 2014-03-03 DIAGNOSIS — R0789 Other chest pain: Secondary | ICD-10-CM | POA: Insufficient documentation

## 2014-03-03 DIAGNOSIS — F411 Generalized anxiety disorder: Secondary | ICD-10-CM

## 2014-03-03 DIAGNOSIS — R079 Chest pain, unspecified: Secondary | ICD-10-CM

## 2014-03-03 DIAGNOSIS — I10 Essential (primary) hypertension: Secondary | ICD-10-CM

## 2014-03-03 DIAGNOSIS — E669 Obesity, unspecified: Secondary | ICD-10-CM

## 2014-03-03 DIAGNOSIS — F17200 Nicotine dependence, unspecified, uncomplicated: Secondary | ICD-10-CM

## 2014-03-03 MED ORDER — HYDROCODONE-ACETAMINOPHEN 7.5-325 MG PO TABS: 1.0000 | ORAL_TABLET | Freq: Three times a day (TID) | ORAL | Status: DC | PRN

## 2014-03-03 NOTE — Assessment & Plan Note (Signed)
Wt Readings from Last 3 Encounters:  03/03/14 204 lb (92.534 kg)  01/06/14 201 lb (91.173 kg)  10/29/13 204 lb (92.534 kg)

## 2014-03-03 NOTE — Assessment & Plan Note (Signed)
Continue with current prescription therapy as reflected on the Med list.  

## 2014-03-03 NOTE — Assessment & Plan Note (Signed)
Quit 1/15

## 2014-03-03 NOTE — Assessment & Plan Note (Signed)
Norco Rx 

## 2014-03-03 NOTE — Assessment & Plan Note (Signed)
Chronic MSK  Potential benefits of a long term opioids use as well as potential risks (i.e. addiction risk, apnea etc) and complications (i.e. Somnolence, constipation and others) were explained to the patient and were aknowledged. Norco Rx was given

## 2014-03-03 NOTE — Assessment & Plan Note (Signed)
EKG - no changes To ER if bad

## 2014-03-03 NOTE — Progress Notes (Signed)
Pre visit review using our clinic review tool, if applicable. No additional management support is needed unless otherwise documented below in the visit note. 

## 2014-03-03 NOTE — Progress Notes (Signed)
   Subjective:  C/o aches and pains and HAs - old complaints  C/o CP off and on F/u pain in R shoulder blade w/ROM  - better Overall doing OK Quit smoking 1/15  HPI      The patient is here to follow up on chronic HTN- better, depression, anxiety, headaches and chronic moderate fibromyalgia/LBP symptoms.  She stopped Glucophage XR - diarrhea   Wt Readings from Last 3 Encounters:  03/03/14 204 lb (92.534 kg)  01/06/14 201 lb (91.173 kg)  10/29/13 204 lb (92.534 kg)   BP Readings from Last 3 Encounters:  03/03/14 122/72  01/06/14 132/70  10/29/13 148/69      Review of Systems  Constitutional: Positive for unexpected weight change. Negative for chills, activity change and appetite change.  HENT: Negative for mouth sores.   Eyes: Negative for visual disturbance.  Respiratory: Negative for chest tightness.   Genitourinary: Negative for frequency, difficulty urinating and vaginal pain.  Musculoskeletal: Positive for arthralgias. Negative for gait problem.  Skin: Negative for pallor.  Neurological: Negative for tremors.  Psychiatric/Behavioral: Positive for sleep disturbance. Negative for suicidal ideas and confusion. The patient is nervous/anxious.         Objective:   Physical Exam  Constitutional: She appears well-developed. No distress.  obese  HENT:  Head: Normocephalic.  Right Ear: External ear normal.  Left Ear: External ear normal.  Nose: Nose normal.  Mouth/Throat: Oropharynx is clear and moist.  Eyes: Conjunctivae are normal. Pupils are equal, round, and reactive to light. Right eye exhibits no discharge. Left eye exhibits no discharge.  Neck: Normal range of motion. Neck supple. No JVD present. No tracheal deviation present. No thyromegaly present.  Cardiovascular: Normal rate, regular rhythm and normal heart sounds.   Pulmonary/Chest: No stridor. No respiratory distress. She has no wheezes.  Abdominal: Soft. Bowel sounds are normal. She exhibits no  distension and no mass. There is no tenderness. There is no rebound and no guarding.  Musculoskeletal: She exhibits tenderness (L groin w/palp and ROM). She exhibits no edema.  Lymphadenopathy:    She has no cervical adenopathy.  Neurological: She displays normal reflexes. No cranial nerve deficit. She exhibits normal muscle tone. Coordination normal.  Skin: Rash (a cluster of blisters R cheek) noted. No erythema.  Psychiatric: She has a normal mood and affect. Her behavior is normal. Judgment and thought content normal.  Acne, moles on face Intertrigo rash - resolved R knee flexors, extensors 5-/5 R knee DTR a little decreased    Lab Results  Component Value Date   WBC 6.2 07/15/2013   HGB 12.9 07/15/2013   HCT 39.3 07/15/2013   PLT 168 07/15/2013   GLUCOSE 116* 10/05/2013   ALT 26 10/05/2013   AST 26 10/05/2013   NA 139 10/05/2013   K 3.9 10/05/2013   CL 107 10/05/2013   CREATININE 0.7 10/05/2013   BUN 18 10/05/2013   CO2 26 10/05/2013   TSH 0.38 10/05/2013   HGBA1C 6.0 10/05/2013          Assessment & Plan:

## 2014-04-21 ENCOUNTER — Other Ambulatory Visit (INDEPENDENT_AMBULATORY_CARE_PROVIDER_SITE_OTHER): Payer: PRIVATE HEALTH INSURANCE

## 2014-04-21 ENCOUNTER — Encounter: Payer: Self-pay | Admitting: Internal Medicine

## 2014-04-21 ENCOUNTER — Ambulatory Visit (INDEPENDENT_AMBULATORY_CARE_PROVIDER_SITE_OTHER): Payer: PRIVATE HEALTH INSURANCE | Admitting: Internal Medicine

## 2014-04-21 VITALS — BP 118/72 | HR 76 | Temp 98.5°F | Resp 16 | Wt 201.0 lb

## 2014-04-21 DIAGNOSIS — F450 Somatization disorder: Secondary | ICD-10-CM

## 2014-04-21 DIAGNOSIS — F17200 Nicotine dependence, unspecified, uncomplicated: Secondary | ICD-10-CM

## 2014-04-21 DIAGNOSIS — E669 Obesity, unspecified: Secondary | ICD-10-CM

## 2014-04-21 DIAGNOSIS — Z720 Tobacco use: Secondary | ICD-10-CM

## 2014-04-21 DIAGNOSIS — M79604 Pain in right leg: Secondary | ICD-10-CM

## 2014-04-21 DIAGNOSIS — F411 Generalized anxiety disorder: Secondary | ICD-10-CM

## 2014-04-21 DIAGNOSIS — M545 Low back pain: Secondary | ICD-10-CM

## 2014-04-21 DIAGNOSIS — E119 Type 2 diabetes mellitus without complications: Secondary | ICD-10-CM

## 2014-04-21 DIAGNOSIS — M79605 Pain in left leg: Secondary | ICD-10-CM

## 2014-04-21 LAB — BASIC METABOLIC PANEL
BUN: 16 mg/dL (ref 6–23)
CO2: 26 mEq/L (ref 19–32)
Calcium: 9.1 mg/dL (ref 8.4–10.5)
Chloride: 106 mEq/L (ref 96–112)
Creatinine, Ser: 0.9 mg/dL (ref 0.4–1.2)
GFR: 64.92 mL/min (ref >60.00)
Glucose, Bld: 102 mg/dL — ABNORMAL HIGH (ref 70–99)
Potassium: 4.5 mEq/L (ref 3.5–5.1)
Sodium: 140 mEq/L (ref 135–145)

## 2014-04-21 LAB — LIPID PANEL
Cholesterol: 183 mg/dL (ref 0–200)
HDL: 44 mg/dL (ref >39.00)
LDL Cholesterol: 108 mg/dL — ABNORMAL HIGH (ref 0–99)
NonHDL: 139
Total CHOL/HDL Ratio: 4
Triglycerides: 155 mg/dL — ABNORMAL HIGH (ref 0.0–149.0)
VLDL: 31 mg/dL (ref 0.0–40.0)

## 2014-04-21 LAB — HEMOGLOBIN A1C: Hgb A1c MFr Bld: 5.9 % (ref 4.6–6.5)

## 2014-04-21 LAB — HEPATIC FUNCTION PANEL
ALT: 34 U/L (ref 0–35)
AST: 32 U/L (ref 0–37)
Albumin: 3.6 g/dL (ref 3.5–5.2)
Alkaline Phosphatase: 80 U/L (ref 39–117)
Bilirubin, Direct: 0.1 mg/dL (ref 0.0–0.3)
Total Bilirubin: 0.6 mg/dL (ref 0.2–1.2)
Total Protein: 7.3 g/dL (ref 6.0–8.3)

## 2014-04-21 MED ORDER — HYDROCODONE-ACETAMINOPHEN 7.5-325 MG PO TABS: 1.0000 | ORAL_TABLET | Freq: Three times a day (TID) | ORAL | Status: DC | PRN

## 2014-04-21 MED ORDER — ALPRAZOLAM 0.5 MG PO TABS: ORAL_TABLET | ORAL | Status: DC

## 2014-04-21 NOTE — Assessment & Plan Note (Signed)
Continue with prn current prescription therapy as reflected on the Med list.

## 2014-04-21 NOTE — Assessment & Plan Note (Signed)
Doing better.   

## 2014-04-21 NOTE — Assessment & Plan Note (Signed)
Not smoking.  

## 2014-04-21 NOTE — Assessment & Plan Note (Signed)
Discussed.

## 2014-04-21 NOTE — Assessment & Plan Note (Signed)
Continue with current prn prescription therapy as reflected on the Med list.  

## 2014-04-21 NOTE — Assessment & Plan Note (Signed)
Continue with current prescription therapy as reflected on the Med list.  

## 2014-04-21 NOTE — Progress Notes (Signed)
Pre visit review using our clinic review tool, if applicable. No additional management support is needed unless otherwise documented below in the visit note. 

## 2014-04-21 NOTE — Assessment & Plan Note (Signed)
Chronic MSK/OA  Potential benefits of a long term opioids use as well as potential risks (i.e. addiction risk, apnea etc) and complications (i.e. Somnolence, constipation and others) were explained to the patient and were aknowledged. Continue with current prescription therapy as reflected on the Med list.

## 2014-04-21 NOTE — Progress Notes (Signed)
   Subjective:  F/u fatigue, aches and pains, LBP and HAs all the time - old complaints  F/u CP off and on - better F/u pain in R shoulder blade w/ROM  - better Overall doing OK Quit smoking 1/15 - not smoking  HPI  The patient is here to follow up on chronic HTN- better, depression, anxiety, headaches and chronic moderate fibromyalgia/LBP symptoms.  She stopped Glucophage XR - diarrhea   Wt Readings from Last 3 Encounters:  04/21/14 201 lb (91.173 kg)  03/03/14 204 lb (92.534 kg)  01/06/14 201 lb (91.173 kg)   BP Readings from Last 3 Encounters:  04/21/14 118/72  03/03/14 122/72  01/06/14 132/70      Review of Systems  Constitutional: Positive for unexpected weight change. Negative for chills, activity change and appetite change.  HENT: Negative for mouth sores.   Eyes: Negative for visual disturbance.  Respiratory: Negative for chest tightness.   Genitourinary: Negative for frequency, difficulty urinating and vaginal pain.  Musculoskeletal: Positive for arthralgias. Negative for gait problem.  Skin: Negative for pallor.  Neurological: Negative for tremors.  Psychiatric/Behavioral: Positive for sleep disturbance. Negative for suicidal ideas and confusion. The patient is nervous/anxious.         Objective:   Physical Exam  Constitutional: She appears well-developed. No distress.  obese  HENT:  Head: Normocephalic.  Right Ear: External ear normal.  Left Ear: External ear normal.  Nose: Nose normal.  Mouth/Throat: Oropharynx is clear and moist.  Eyes: Conjunctivae are normal. Pupils are equal, round, and reactive to light. Right eye exhibits no discharge. Left eye exhibits no discharge.  Neck: Normal range of motion. Neck supple. No JVD present. No tracheal deviation present. No thyromegaly present.  Cardiovascular: Normal rate, regular rhythm and normal heart sounds.   Pulmonary/Chest: No stridor. No respiratory distress. She has no wheezes.  Abdominal: Soft.  Bowel sounds are normal. She exhibits no distension and no mass. There is no tenderness. There is no rebound and no guarding.  Musculoskeletal: She exhibits tenderness (L groin w/palp and ROM). She exhibits no edema.  Lymphadenopathy:    She has no cervical adenopathy.  Neurological: She displays normal reflexes. No cranial nerve deficit. She exhibits normal muscle tone. Coordination normal.  Skin: Rash (a cluster of blisters R cheek) noted. No erythema.  Psychiatric: She has a normal mood and affect. Her behavior is normal. Judgment and thought content normal.  Acne, moles on face Intertrigo rash - resolved R knee flexors, extensors 5-/5 R knee DTR a little decreased    Lab Results  Component Value Date   WBC 6.2 07/15/2013   HGB 12.9 07/15/2013   HCT 39.3 07/15/2013   PLT 168 07/15/2013   GLUCOSE 116* 10/05/2013   ALT 26 10/05/2013   AST 26 10/05/2013   NA 139 10/05/2013   K 3.9 10/05/2013   CL 107 10/05/2013   CREATININE 0.7 10/05/2013   BUN 18 10/05/2013   CO2 26 10/05/2013   TSH 0.38 10/05/2013   HGBA1C 6.0 10/05/2013          Assessment & Plan:

## 2014-05-02 ENCOUNTER — Emergency Department (INDEPENDENT_AMBULATORY_CARE_PROVIDER_SITE_OTHER)
Admission: EM | Admit: 2014-05-02 | Discharge: 2014-05-02 | Disposition: A | Discharge status: Home / Self Care | Payer: PRIVATE HEALTH INSURANCE | Source: Home / Self Care | Attending: Emergency Medicine | Admitting: Emergency Medicine

## 2014-05-02 ENCOUNTER — Encounter (HOSPITAL_COMMUNITY): Payer: Self-pay | Admitting: Emergency Medicine

## 2014-05-02 DIAGNOSIS — M5432 Sciatica, left side: Secondary | ICD-10-CM | POA: Diagnosis not present

## 2014-05-02 MED ORDER — NORTRIPTYLINE HCL 25 MG PO CAPS: 25.0000 mg | ORAL_CAPSULE | Freq: Every day | ORAL | Status: DC

## 2014-05-02 MED ORDER — PREDNISONE 50 MG PO TABS: ORAL_TABLET | ORAL | Status: DC

## 2014-05-02 NOTE — ED Notes (Signed)
Left leg pain .  Pain 3-4 days.  No known injury.  Feels burning sensation in left thigh, pain travels to left foot, sharp pain in foot

## 2014-05-02 NOTE — Discharge Instructions (Signed)
Your sciatic nerve is being irritated. Take prednisone 1 pill daily for 5 days. Take nortriptyline at bedtime for the next 2 weeks.  Followup with your regular doctor in 2 weeks for a recheck. If you develop worsening pain or weakness in the leg, please go to the ER.

## 2014-05-02 NOTE — ED Provider Notes (Signed)
CSN: [id number]     Arrival date & time 05/02/14  1609 History   First MD Initiated Contact with Patient 05/02/14 1614     Chief Complaint  Patient presents with  . Leg Pain   (Consider location/radiation/quality/duration/timing/severity/associated sxs/prior Treatment) HPI She is a 68 year old woman here for evaluation of left leg pain. She states for the last 3-4 days, she has had pain in the left lower back, a burning sensation in the lateral thigh, and a sharp/shooting pain in the lateral shin to the ankle. She denies any weakness in the leg. She does state it is painful to flex her hip. She denies any back pain. She denies any injuries or falls. The pains are worse with prolonged walking. It makes it difficult for her to rest at night.  Past Medical History  Diagnosis Date  . HYPERLIPIDEMIA 04/30/2006  . ANXIETY 05/25/2006  . 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 INSOMNIA, PERSISTENT 02/22/2008  . DEPRESSION 01/02/2006  . OBSTRUCTIVE SLEEP APNEA 04/17/2010  . 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 ESOTROPIA, LEFT EYE March 05, 1946  . HYPERTENSION 05/25/2006  . BRONCHITIS, ACUTE 12/08/2007  . COPD 05/25/2006  . T M J 05/11/2009  . OSTEOARTHRITIS 05/25/2006  . LOW BACK PAIN 01/27/2007  . FIBROMYALGIA 04/02/2006  . Headache(784.0) 12/08/2007  . HYPERGLYCEMIA 05/15/2010  . Abdominal pain, epigastric 12/18/2009  . Lazy eye     left  . Polyuria   . Migraines   . Psychosomatic disease 2011   Past Surgical History  Procedure Laterality Date  . Cholecystectomy     Family History  Problem Relation Age of Onset  . Heart attack Father   . Hypertension Brother   . Heart disease Mother    History  Substance Use Topics  . Smoking status: Former Smoker    Quit date: 07/10/2013  . Smokeless tobacco: Not on file     Comment: smoked 1965-2014, up to 1 ppd  . Alcohol Use: No   OB History   Grav Para Term Preterm Abortions TAB SAB Ect Mult Living                 Review of Systems   Musculoskeletal: Negative for back pain.       Left leg pain    Allergies  Morphine and related; Bupropion hcl; Duloxetine; Ezetimibe; Metformin and related; Trazodone hcl; Gabapentin; and Tramadol hcl  Home Medications   Prior to Admission medications   Medication Sig Start Date End Date Taking? Authorizing Provider  ALPRAZolam (XANAX) 0.5 MG tablet TAKE 1 TABLET BY MOUTH TWICE DAILY 04/21/14   Aleksei Plotnikov V, MD  cyclobenzaprine (FLEXERIL) 10 MG tablet Take 1 tablet (10 mg total) by mouth 2 (two) times daily as needed for muscle spasms. 10/29/13   Alvina Chou, PA-C  HYDROcodone-acetaminophen (NORCO) 7.5-325 MG per tablet Take 1 tablet by mouth every 8 (eight) hours as needed for moderate pain or severe pain. 04/21/14   Aleksei Plotnikov V, MD  metoprolol succinate (TOPROL-XL) 25 MG 24 hr tablet Take 1 tablet (25 mg total) by mouth every morning. 09/30/13   Aleksei Plotnikov V, MD  mupirocin ointment (BACTROBAN) 2 % Applied twice a day to the affected area;NOT into eyes. 10/13/13   Hendricks Limes, MD  nortriptyline (PAMELOR) 25 MG capsule Take 1 capsule (25 mg total) by mouth at bedtime. 05/02/14   Melony Overly, MD  predniSONE (DELTASONE) 50 MG tablet Take 1 pill daily for 5 days. 05/02/14   Melony Overly, MD  tiZANidine (  ZANAFLEX) 4 MG tablet Take 4 mg by mouth every 8 (eight) hours as needed (for leg pain). 04/05/13   Aleksei Plotnikov V, MD   BP 143/82  Pulse 76  Temp(Src) 98.5 F (36.9 C) (Oral)  Resp 20  SpO2 97% Physical Exam  Constitutional: She is oriented to person, place, and time. She appears well-developed and well-nourished. No distress.  Musculoskeletal:  Back: no erythema or edema.  No vertebral tenderness or step-offs.  Tender over left SI joint and minimally over left piriformis.  +seated SLR on the left, negative on right.  Left hip: full active ROM without pain. Left knee: full active ROM without pain.  Neurological: She is alert and oriented to person,  place, and time. She has normal strength. No sensory deficit. She exhibits normal muscle tone.  Reflex Scores:      Patellar reflexes are 2+ on the right side and 2+ on the left side. Left quad is slightly weak, but due to pain    ED Course  Procedures (including critical care time) Labs Review Labs Reviewed - No data to display  Imaging Review No results found.   MDM   1. Left sciatic nerve pain    Although her history is not classic, her exam is consistent with left-sided sciatica. I suspect this is coming from outside the spinal cord. Neurologic exam is not concerning. We'll treat with prednisone burst. She is taking gabapentin in the past, and does not tolerate it. We will do 2 weeks of nortriptyline to help with nerve pain. Warning signs reviewed as in after visit summary. Followup with primary care provider in 2 weeks.    Melony Overly, MD 05/02/14 [phone]

## 2014-05-12 ENCOUNTER — Ambulatory Visit: Payer: PRIVATE HEALTH INSURANCE | Admitting: Internal Medicine

## 2014-05-18 ENCOUNTER — Telehealth: Payer: Self-pay

## 2014-05-18 ENCOUNTER — Encounter: Payer: Self-pay | Admitting: Internal Medicine

## 2014-05-18 DIAGNOSIS — Z1212 Encounter for screening for malignant neoplasm of rectum: Secondary | ICD-10-CM

## 2014-05-18 DIAGNOSIS — Z1211 Encounter for screening for malignant neoplasm of colon: Secondary | ICD-10-CM

## 2014-05-18 NOTE — Telephone Encounter (Signed)
Spoke to pt.   Pt stated that she had never had a colonoscopy and was agreeable for a referral to be place for her to schedule it.

## 2014-05-27 ENCOUNTER — Other Ambulatory Visit: Payer: Self-pay | Admitting: Internal Medicine

## 2014-05-27 DIAGNOSIS — Z1212 Encounter for screening for malignant neoplasm of rectum: Secondary | ICD-10-CM

## 2014-05-27 DIAGNOSIS — Z1211 Encounter for screening for malignant neoplasm of colon: Secondary | ICD-10-CM

## 2014-06-12 ENCOUNTER — Encounter: Payer: Self-pay | Admitting: Internal Medicine

## 2014-06-16 ENCOUNTER — Other Ambulatory Visit (INDEPENDENT_AMBULATORY_CARE_PROVIDER_SITE_OTHER): Payer: PRIVATE HEALTH INSURANCE

## 2014-06-16 ENCOUNTER — Ambulatory Visit (INDEPENDENT_AMBULATORY_CARE_PROVIDER_SITE_OTHER): Payer: PRIVATE HEALTH INSURANCE | Admitting: Internal Medicine

## 2014-06-16 ENCOUNTER — Encounter: Payer: Self-pay | Admitting: Internal Medicine

## 2014-06-16 VITALS — BP 140/78 | HR 73 | Temp 98.0°F | Wt 206.0 lb

## 2014-06-16 DIAGNOSIS — R358 Other polyuria: Secondary | ICD-10-CM

## 2014-06-16 DIAGNOSIS — R3589 Other polyuria: Secondary | ICD-10-CM

## 2014-06-16 DIAGNOSIS — H6123 Impacted cerumen, bilateral: Secondary | ICD-10-CM

## 2014-06-16 DIAGNOSIS — H6120 Impacted cerumen, unspecified ear: Secondary | ICD-10-CM | POA: Insufficient documentation

## 2014-06-16 NOTE — Progress Notes (Signed)
Pre visit review using our clinic review tool, if applicable. No additional management support is needed unless otherwise documented below in the visit note. 

## 2014-06-16 NOTE — Assessment & Plan Note (Signed)
UA

## 2014-06-16 NOTE — Assessment & Plan Note (Signed)
See Procedure 

## 2014-06-16 NOTE — Progress Notes (Signed)
Subjective:  F/u fatigue, aches and pains, LBP and HAs all the time - old complaints  F/u CP off and on - better F/u pain in R shoulder blade w/ROM  - better Overall doing OK Quit smoking 1/15 - not smoking  HPI  The patient is here to follow up on chronic HTN- better, depression, anxiety, headaches and chronic moderate fibromyalgia/LBP symptoms.  She stopped Glucophage XR - diarrhea   Wt Readings from Last 3 Encounters:  06/16/14 206 lb (93.441 kg)  04/21/14 201 lb (91.173 kg)  03/03/14 204 lb (92.534 kg)   BP Readings from Last 3 Encounters:  06/16/14 140/78  05/02/14 143/82  04/21/14 118/72      Review of Systems  Constitutional: Positive for unexpected weight change. Negative for chills, activity change and appetite change.  HENT: Negative for mouth sores.   Eyes: Negative for visual disturbance.  Respiratory: Negative for chest tightness.   Genitourinary: Negative for frequency, difficulty urinating and vaginal pain.  Musculoskeletal: Positive for arthralgias. Negative for gait problem.  Skin: Negative for pallor.  Neurological: Negative for tremors.  Psychiatric/Behavioral: Positive for sleep disturbance. Negative for suicidal ideas and confusion. The patient is nervous/anxious.         Objective:   Physical Exam  Constitutional: She appears well-developed. No distress.  HENT:  Head: Normocephalic.  Right Ear: External ear normal.  Left Ear: External ear normal.  Nose: Nose normal.  Mouth/Throat: Oropharynx is clear and moist.  Eyes: Conjunctivae are normal. Pupils are equal, round, and reactive to light. Right eye exhibits no discharge. Left eye exhibits no discharge.  Neck: Normal range of motion. Neck supple. No JVD present. No tracheal deviation present. No thyromegaly present.  Cardiovascular: Normal rate, regular rhythm and normal heart sounds.   Pulmonary/Chest: No stridor. No respiratory distress. She has no wheezes.  Abdominal: Soft. Bowel  sounds are normal. She exhibits no distension and no mass. There is no tenderness. There is no rebound and no guarding.  Musculoskeletal: She exhibits no edema or tenderness.  Lymphadenopathy:    She has no cervical adenopathy.  Neurological: She displays normal reflexes. No cranial nerve deficit. She exhibits normal muscle tone. Coordination normal.  Skin: No rash noted. No erythema.  Psychiatric: She has a normal mood and affect. Her behavior is normal. Judgment and thought content normal.  Acne, moles on face Intertrigo rash - resolved R knee flexors, extensors 5-/5 R knee DTR a little decreased Obese B wax  Lab Results  Component Value Date   WBC 6.2 07/15/2013   HGB 12.9 07/15/2013   HCT 39.3 07/15/2013   PLT 168 07/15/2013   GLUCOSE 102* 04/21/2014   CHOL 183 04/21/2014   TRIG 155.0* 04/21/2014   HDL 44.00 04/21/2014   LDLCALC 108* 04/21/2014   ALT 34 04/21/2014   AST 32 04/21/2014   NA 140 04/21/2014   K 4.5 04/21/2014   CL 106 04/21/2014   CREATININE 0.9 04/21/2014   BUN 16 04/21/2014   CO2 26 04/21/2014   TSH 0.38 10/05/2013   HGBA1C 5.9 04/21/2014     Procedure Note :     Procedure :  Ear irrigation   Indication:  Cerumen impaction B   Risks, including pain, dizziness, eardrum perforation, bleeding, infection and others as well as benefits were explained to the patient in detail. Verbal consent was obtained and the patient agreed to proceed.    We used "The Elephant Ear Irrigation Device" filled with lukewarm water for irrigation. A large  amount wax was recovered. Procedure has also required manual wax removal with an ear loop.   Tolerated well. Complications: None.   Postprocedure instructions :  Call if problems.        Assessment & Plan:  Patient ID: Amanda Arnold, female   DOB: [birth year], 68 y.o.   MRN: [id number]

## 2014-06-18 LAB — URINALYSIS
Bilirubin Urine: NEGATIVE
Hgb urine dipstick: NEGATIVE
Ketones, ur: NEGATIVE
Leukocytes, UA: NEGATIVE
Nitrite: NEGATIVE
Specific Gravity, Urine: >1.03 — AB (ref 1.000–1.030)
Total Protein, Urine: NEGATIVE
Urine Glucose: NEGATIVE
Urobilinogen, UA: 0.2 — AB (ref 0.0–1.0)
pH: 5.5 (ref 5.0–8.0)

## 2014-07-25 ENCOUNTER — Emergency Department (HOSPITAL_COMMUNITY): Payer: Medicare Other

## 2014-07-25 ENCOUNTER — Emergency Department (HOSPITAL_COMMUNITY)
Admission: EM | Admit: 2014-07-25 | Discharge: 2014-07-25 | Disposition: A | Discharge status: Home / Self Care | Payer: Medicare Other | Attending: Emergency Medicine | Admitting: Emergency Medicine

## 2014-07-25 ENCOUNTER — Encounter (HOSPITAL_COMMUNITY): Payer: Self-pay | Admitting: *Deleted

## 2014-07-25 DIAGNOSIS — F419 Anxiety disorder, unspecified: Secondary | ICD-10-CM | POA: Insufficient documentation

## 2014-07-25 DIAGNOSIS — Z87891 Personal history of nicotine dependence: Secondary | ICD-10-CM | POA: Diagnosis not present

## 2014-07-25 DIAGNOSIS — R51 Headache: Secondary | ICD-10-CM

## 2014-07-25 DIAGNOSIS — H578 Other specified disorders of eye and adnexa: Secondary | ICD-10-CM | POA: Diagnosis not present

## 2014-07-25 DIAGNOSIS — R519 Headache, unspecified: Secondary | ICD-10-CM

## 2014-07-25 DIAGNOSIS — I517 Cardiomegaly: Secondary | ICD-10-CM | POA: Diagnosis not present

## 2014-07-25 DIAGNOSIS — H43813 Vitreous degeneration, bilateral: Secondary | ICD-10-CM | POA: Diagnosis not present

## 2014-07-25 DIAGNOSIS — Z8639 Personal history of other endocrine, nutritional and metabolic disease: Secondary | ICD-10-CM | POA: Diagnosis not present

## 2014-07-25 DIAGNOSIS — M1990 Unspecified osteoarthritis, unspecified site: Secondary | ICD-10-CM | POA: Insufficient documentation

## 2014-07-25 DIAGNOSIS — G500 Trigeminal neuralgia: Secondary | ICD-10-CM | POA: Diagnosis not present

## 2014-07-25 DIAGNOSIS — R079 Chest pain, unspecified: Secondary | ICD-10-CM | POA: Insufficient documentation

## 2014-07-25 DIAGNOSIS — J449 Chronic obstructive pulmonary disease, unspecified: Secondary | ICD-10-CM | POA: Insufficient documentation

## 2014-07-25 DIAGNOSIS — G43909 Migraine, unspecified, not intractable, without status migrainosus: Secondary | ICD-10-CM | POA: Insufficient documentation

## 2014-07-25 DIAGNOSIS — M542 Cervicalgia: Secondary | ICD-10-CM | POA: Insufficient documentation

## 2014-07-25 DIAGNOSIS — M797 Fibromyalgia: Secondary | ICD-10-CM | POA: Insufficient documentation

## 2014-07-25 DIAGNOSIS — H53141 Visual discomfort, right eye: Secondary | ICD-10-CM | POA: Insufficient documentation

## 2014-07-25 DIAGNOSIS — I10 Essential (primary) hypertension: Secondary | ICD-10-CM | POA: Insufficient documentation

## 2014-07-25 DIAGNOSIS — M269 Dentofacial anomaly, unspecified: Secondary | ICD-10-CM | POA: Diagnosis not present

## 2014-07-25 DIAGNOSIS — E119 Type 2 diabetes mellitus without complications: Secondary | ICD-10-CM | POA: Diagnosis not present

## 2014-07-25 DIAGNOSIS — H25013 Cortical age-related cataract, bilateral: Secondary | ICD-10-CM | POA: Diagnosis not present

## 2014-07-25 DIAGNOSIS — R531 Weakness: Secondary | ICD-10-CM | POA: Diagnosis not present

## 2014-07-25 DIAGNOSIS — F329 Major depressive disorder, single episode, unspecified: Secondary | ICD-10-CM | POA: Insufficient documentation

## 2014-07-25 DIAGNOSIS — H5310 Unspecified subjective visual disturbances: Secondary | ICD-10-CM | POA: Diagnosis not present

## 2014-07-25 DIAGNOSIS — J42 Unspecified chronic bronchitis: Secondary | ICD-10-CM | POA: Diagnosis not present

## 2014-07-25 LAB — BASIC METABOLIC PANEL
Anion gap: 7 (ref 5–15)
BUN: 16 mg/dL (ref 6–23)
CO2: 25 mmol/L (ref 19–32)
Calcium: 9 mg/dL (ref 8.4–10.5)
Chloride: 108 mEq/L (ref 96–112)
Creatinine, Ser: 0.66 mg/dL (ref 0.50–1.10)
GFR calc Af Amer: >90 mL/min (ref >90)
GFR calc non Af Amer: >90 mL/min (ref >90)
Glucose, Bld: 101 mg/dL — ABNORMAL HIGH (ref 70–99)
Potassium: 4.3 mmol/L (ref 3.5–5.1)
Sodium: 140 mmol/L (ref 135–145)

## 2014-07-25 LAB — I-STAT TROPONIN, ED: Troponin i, poc: 0 ng/mL (ref 0.00–0.08)

## 2014-07-25 LAB — CBC
HCT: 39.3 % (ref 36.0–46.0)
Hemoglobin: 13.1 g/dL (ref 12.0–15.0)
MCH: 31 pg (ref 26.0–34.0)
MCHC: 33.3 g/dL (ref 30.0–36.0)
MCV: 93.1 fL (ref 78.0–100.0)
Platelets: 179 10*3/uL (ref 150–400)
RBC: 4.22 MIL/uL (ref 3.87–5.11)
RDW: 12.8 % (ref 11.5–15.5)
WBC: 5.2 10*3/uL (ref 4.0–10.5)

## 2014-07-25 LAB — HM DIABETES EYE EXAM

## 2014-07-25 MED ORDER — SODIUM CHLORIDE 0.9 % IV BOLUS (SEPSIS)
1000.0000 mL | Freq: Once | INTRAVENOUS | Status: AC
  Administered 2014-07-25: 1000 mL via INTRAVENOUS

## 2014-07-25 MED ORDER — NAPROXEN 500 MG PO TABS: 500.0000 mg | ORAL_TABLET | Freq: Two times a day (BID) | ORAL | Status: DC

## 2014-07-25 MED ORDER — METOCLOPRAMIDE HCL 5 MG/ML IJ SOLN
10.0000 mg | Freq: Once | INTRAMUSCULAR | Status: AC
  Administered 2014-07-25: 10 mg via INTRAVENOUS
  Filled 2014-07-25: qty 2

## 2014-07-25 MED ORDER — METOCLOPRAMIDE HCL 10 MG PO TABS: 10.0000 mg | ORAL_TABLET | Freq: Three times a day (TID) | ORAL | Status: DC | PRN

## 2014-07-25 MED ORDER — KETOROLAC TROMETHAMINE 30 MG/ML IJ SOLN
30.0000 mg | Freq: Once | INTRAMUSCULAR | Status: AC
  Administered 2014-07-25: 30 mg via INTRAVENOUS
  Filled 2014-07-25: qty 1

## 2014-07-25 MED ORDER — RANITIDINE HCL 150 MG PO TABS: 150.0000 mg | ORAL_TABLET | Freq: Two times a day (BID) | ORAL | Status: DC

## 2014-07-25 MED ORDER — ACETAMINOPHEN 325 MG PO TABS
650.0000 mg | ORAL_TABLET | Freq: Once | ORAL | Status: AC
  Administered 2014-07-25: 650 mg via ORAL
  Filled 2014-07-25: qty 2

## 2014-07-25 NOTE — Discharge Instructions (Signed)
Please call your doctor for a followup appointment within 24-48 hours. When you talk to your doctor please let them know that you were seen in the emergency department and have them acquire all of your records so that they can discuss the findings with you and formulate a treatment plan to fully care for your new and ongoing problems. ° °

## 2014-07-25 NOTE — ED Notes (Signed)
Today: went to eye dr. See glitter going up and down in the rt. Eye. H/a's at night. Burning on lt. Side of chest. Took asa last night. Normal lt. Upper eye lid droop.

## 2014-07-25 NOTE — ED Provider Notes (Signed)
CSN: [id number]     Arrival date & time 07/25/14  1233 History   First MD Initiated Contact with Patient 07/25/14 1251     Chief Complaint  Patient presents with  . Weakness  . Eye Problem  . Facial Pain  . Chest Pain     (Consider location/radiation/quality/duration/timing/severity/associated sxs/prior Treatment) HPI Comments: 69 year old female, presents from the ophthalmology office with a complaint of headache, chest pain and neck pain. She states that the headaches have been ongoing for approximately one week, there gradually worsening, they are all over" and associated with eye discomfort, photophobia and a feeling of glitter in the right eye. She is unsure what the ophthalmologist thought her eye problem was. She was sent to the hospital according to her report to be evaluated for mini strokes and to evaluate her chest pain. She states that the chest pain occurs mostly at night as well. She was feeling it today during the daytime, it is a burning in the middle of the chest, she states that she does exercise by walking around the dog park, she does not get chest pain when she does this. She does report having a history of diet-controlled diabetes, hypertension and has had a stress test sometime in the last 10 years which she reports was normal. The headache has been ongoing for 1 week, she has it in the daytime and nighttime though she states it is worse at night. She did take an aspirin last night which she states did help, she also took hydrocodone 7.5 tablet which she states did help as well.  Patient is a 69 y.o. female presenting with weakness, eye problem, and chest pain. The history is provided by the patient.  Weakness Associated symptoms include chest pain.  Eye Problem Associated symptoms: weakness   Chest Pain Associated symptoms: weakness     Past Medical History  Diagnosis Date  . HYPERLIPIDEMIA 04/30/2006  . ANXIETY 05/25/2006  . 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  INSOMNIA, PERSISTENT 02/22/2008  . DEPRESSION 01/02/2006  . OBSTRUCTIVE SLEEP APNEA 04/17/2010  . 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 ESOTROPIA, LEFT EYE [birth year], 1947  . HYPERTENSION 05/25/2006  . BRONCHITIS, ACUTE 12/08/2007  . COPD 05/25/2006  . T M J 05/11/2009  . OSTEOARTHRITIS 05/25/2006  . LOW BACK PAIN 01/27/2007  . FIBROMYALGIA 04/02/2006  . Headache(784.0) 12/08/2007  . HYPERGLYCEMIA 05/15/2010  . Abdominal pain, epigastric 12/18/2009  . Lazy eye     left  . Polyuria   . Migraines   . Psychosomatic disease 2011   Past Surgical History  Procedure Laterality Date  . Cholecystectomy     Family History  Problem Relation Age of Onset  . Heart attack Father   . Hypertension Brother   . Heart disease Mother    History  Substance Use Topics  . Smoking status: Former Smoker    Quit date: 07/10/2013  . Smokeless tobacco: Not on file     Comment: smoked 1965-2014, up to 1 ppd  . Alcohol Use: No   OB History    No data available     Review of Systems  Cardiovascular: Positive for chest pain.  Neurological: Positive for weakness.  All other systems reviewed and are negative.     Allergies  Morphine and related; Bupropion hcl; Duloxetine; Ezetimibe; Metformin and related; Trazodone hcl; Gabapentin; and Tramadol hcl  Home Medications   Prior to Admission medications   Medication Sig Start Date End Date Taking? Authorizing Provider  ALPRAZolam Duanne Moron) 0.5 MG tablet TAKE  1 TABLET BY MOUTH TWICE DAILY 04/21/14   Aleksei Plotnikov V, MD  cyclobenzaprine (FLEXERIL) 10 MG tablet Take 1 tablet (10 mg total) by mouth 2 (two) times daily as needed for muscle spasms. 10/29/13   Alvina Chou, PA-C  HYDROcodone-acetaminophen (NORCO) 7.5-325 MG per tablet Take 1 tablet by mouth every 8 (eight) hours as needed for moderate pain or severe pain. 04/21/14   Aleksei Plotnikov V, MD  metoprolol succinate (TOPROL-XL) 25 MG 24 hr tablet Take 1 tablet (25 mg total) by mouth every morning.  09/30/13   Aleksei Plotnikov V, MD  mupirocin ointment (BACTROBAN) 2 % Applied twice a day to the affected area;NOT into eyes. 10/13/13   Hendricks Limes, MD  nortriptyline (PAMELOR) 25 MG capsule Take 1 capsule (25 mg total) by mouth at bedtime. 05/02/14   Melony Overly, MD  predniSONE (DELTASONE) 50 MG tablet Take 1 pill daily for 5 days. 05/02/14   Melony Overly, MD  tiZANidine (ZANAFLEX) 4 MG tablet Take 4 mg by mouth every 8 (eight) hours as needed (for leg pain). 04/05/13   Aleksei Plotnikov V, MD   BP 138/115 mmHg  Pulse 76  Temp(Src) 98.1 F (36.7 C) (Oral)  Resp 28  Ht 5\' 2"  (1.575 m)  Wt 204 lb (92.534 kg)  BMI 37.30 kg/m2  SpO2 96% Physical Exam  Constitutional: She appears well-developed and well-nourished. No distress.  HENT:  Head: Normocephalic and atraumatic.  Mouth/Throat: Oropharynx is clear and moist. No oropharyngeal exudate.  Sinuses nontender, oropharynx clear  Eyes: Conjunctivae and EOM are normal. Right eye exhibits no discharge. Left eye exhibits no discharge. No scleral icterus.  No obvious abnormal findings on ophthalmologic exam other than dilated pupils (medicated prior to arrival)  Neck: Normal range of motion. Neck supple. No JVD present. No thyromegaly present.  Very supple neck without lymphadenopathy  Cardiovascular: Normal rate, regular rhythm, normal heart sounds and intact distal pulses.  Exam reveals no gallop and no friction rub.   No murmur heard. Normal rate and rhythm, normal pulses at the radial arteries bilaterally  Pulmonary/Chest: Effort normal and breath sounds normal. No respiratory distress. She has no wheezes. She has no rales. She exhibits no tenderness.  No chest tenderness, normal respiratory sounds  Abdominal: Soft. Bowel sounds are normal. She exhibits no distension and no mass. There is no tenderness.  Musculoskeletal: Normal range of motion. She exhibits no edema or tenderness.  Lymphadenopathy:    She has no cervical adenopathy.   Neurological: She is alert. Coordination normal.  Speech is clear, cranial nerves III through XII are intact, memory is intact, strength is normal in all 4 extremities including grips, sensation is intact to light touch and pinprick in all 4 extremities. Coordination as tested by finger-nose-finger is normal, no limb ataxia. Normal gait, normal reflexes at the patellar tendons bilaterally  Skin: Skin is warm and dry. No rash noted. No erythema.  Psychiatric: She has a normal mood and affect. Her behavior is normal.  Nursing note and vitals reviewed.   ED Course  Procedures (including critical care time) Labs Review Labs Reviewed  CBC  BASIC METABOLIC PANEL  I-STAT [employer], ED  I-STAT TROPOININ, ED    Imaging Review No results found.   EKG Interpretation   Date/Time:  Tuesday July 25 2014 12:43:47 EST Ventricular Rate:  78 PR Interval:  164 QRS Duration: 84 QT Interval:  388 QTC Calculation: 442 R Axis:   -12 Text Interpretation:  Normal sinus rhythm Nonspecific  T wave abnormality  Abnormal ECG since last tracing no significant change Confirmed by Annisa Mazzarella   MD, Sahith Nurse ([id number]) on 07/25/2014 1:22:57 PM      MDM   Final diagnoses:  Headache    The patient has symptoms such as headache and chest pain which were in need of evaluation, will discuss with ophthalmology regarding the source of the eye complaint, it appears to be unilateral suggesting a peripheral source, blood pressure is elevated, no focal neurologic findings, CT scan of the head, labs, recheck. EKG nonischemic. Her chest pain seems to be burning and not exertional, troponin pending  The pt has improved headache after medications as below, she also has no chest pain, she states that her symptoms are much improved after the headache cocktail, her CT scan is negative, her chest x-ray and lab work revealed no signs of coronary artery disease, I suspect that the patient will need to follow up very closely with her  family doctor, I will start antacid medications as well as medications for her headaches. She is amenable to this plan, she has been given cardiology follow-up number   Meds given in ED:  Medications  acetaminophen (TYLENOL) tablet 650 mg (650 mg Oral Given 07/25/14 1416)  ketorolac (TORADOL) 30 MG/ML injection 30 mg (30 mg Intravenous Given 07/25/14 1504)  metoCLOPramide (REGLAN) injection 10 mg (10 mg Intravenous Given 07/25/14 1504)  sodium chloride 0.9 % bolus 1,000 mL (1,000 mLs Intravenous New Bag/Given 07/25/14 1509)    New Prescriptions   METOCLOPRAMIDE (REGLAN) 10 MG TABLET    Take 1 tablet (10 mg total) by mouth 3 (three) times daily as needed for nausea (headache / nausea).   NAPROXEN (NAPROSYN) 500 MG TABLET    Take 1 tablet (500 mg total) by mouth 2 (two) times daily with a meal.   RANITIDINE (ZANTAC) 150 MG TABLET    Take 1 tablet (150 mg total) by mouth 2 (two) times daily.      Johnna Acosta, MD 07/25/14 [phone]

## 2014-07-27 ENCOUNTER — Encounter: Payer: Self-pay | Admitting: Internal Medicine

## 2014-07-27 ENCOUNTER — Ambulatory Visit (INDEPENDENT_AMBULATORY_CARE_PROVIDER_SITE_OTHER): Payer: Medicare Other | Admitting: Internal Medicine

## 2014-07-27 VITALS — BP 112/68 | HR 76 | Temp 98.0°F | Resp 16 | Ht 62.0 in | Wt 205.0 lb

## 2014-07-27 DIAGNOSIS — I10 Essential (primary) hypertension: Secondary | ICD-10-CM

## 2014-07-27 DIAGNOSIS — R072 Precordial pain: Secondary | ICD-10-CM | POA: Diagnosis not present

## 2014-07-27 DIAGNOSIS — R9431 Abnormal electrocardiogram [ECG] [EKG]: Secondary | ICD-10-CM | POA: Insufficient documentation

## 2014-07-27 MED ORDER — HYDROCODONE-ACETAMINOPHEN 7.5-325 MG PO TABS: 1.0000 | ORAL_TABLET | Freq: Three times a day (TID) | ORAL | Status: DC | PRN

## 2014-07-27 NOTE — Patient Instructions (Signed)

## 2014-07-27 NOTE — Progress Notes (Signed)
Pre visit review using our clinic review tool, if applicable. No additional management support is needed unless otherwise documented below in the visit note. 

## 2014-07-28 NOTE — Assessment & Plan Note (Signed)
Her BP is well controlled 

## 2014-07-28 NOTE — Assessment & Plan Note (Signed)
Her pain and other s/s are atypical Her EKG shows NS ST/T wave inversions, no Q waves She has some risk factors for CAD so I have ordered a lexiscan test Will try norco as this may be MS chest wall pain

## 2014-07-28 NOTE — Assessment & Plan Note (Signed)
She can't do an ETT due to some ortho issues Will get a lexiscan done

## 2014-07-28 NOTE — Progress Notes (Signed)
   Subjective:    Patient ID: Amanda Arnold, female    DOB: 12/27/1945, 69 y.o.   MRN: [id number]  HPI Comments: She was seen in the ER 2 days ago for CP and was told to have a stress test ordered. Her troponin was negative.  Chest Pain  This is a recurrent problem. The current episode started more than 1 month ago. The onset quality is gradual. The problem occurs intermittently. The problem has been unchanged. Pain location: left lower precordial area. The pain is at a severity of 2/10. The pain is mild. The quality of the pain is described as burning. The pain does not radiate. Associated symptoms include dizziness, headaches and shortness of breath. Pertinent negatives include no abdominal pain, back pain, claudication, cough, diaphoresis, exertional chest pressure, fever, hemoptysis, irregular heartbeat, leg pain, lower extremity edema, malaise/fatigue, nausea, near-syncope, numbness, orthopnea, palpitations, PND, sputum production, syncope, vomiting or weakness. She has tried nothing for the symptoms. Risk factors include sedentary lifestyle, lack of exercise and obesity.  Her family medical history is significant for CAD.      Review of Systems  Constitutional: Negative.  Negative for fever, chills, malaise/fatigue, diaphoresis, appetite change and fatigue.  HENT: Negative for trouble swallowing.   Eyes: Negative.   Respiratory: Positive for shortness of breath. Negative for cough, hemoptysis, sputum production, choking, chest tightness, wheezing and stridor.   Cardiovascular: Positive for chest pain. Negative for palpitations, orthopnea, claudication, leg swelling, syncope, PND and near-syncope.  Gastrointestinal: Negative for nausea, vomiting, abdominal pain, diarrhea, constipation and blood in stool.  Endocrine: Negative.   Genitourinary: Negative.   Musculoskeletal: Negative.  Negative for back pain.  Skin: Negative.  Negative for rash.  Allergic/Immunologic: Negative.     Neurological: Positive for dizziness and headaches. Negative for syncope, weakness and numbness.  Hematological: Negative.   Psychiatric/Behavioral: Negative.        Objective:   Physical Exam  Constitutional: She is oriented to person, place, and time. She appears well-developed and well-nourished. No distress.  HENT:  Head: Normocephalic and atraumatic.  Mouth/Throat: Oropharynx is clear and moist. No oropharyngeal exudate.  Eyes: Conjunctivae are normal. Right eye exhibits no discharge. Left eye exhibits no discharge. No scleral icterus.  Neck: Normal range of motion. Neck supple. No JVD present. No tracheal deviation present. No thyromegaly present.  Cardiovascular: Normal rate, regular rhythm, normal heart sounds and intact distal pulses.  Exam reveals no gallop and no friction rub.   No murmur heard. Pulmonary/Chest: Effort normal and breath sounds normal. No stridor. No respiratory distress. She has no wheezes. She has no rales. She exhibits no tenderness.  Abdominal: Soft. Bowel sounds are normal. She exhibits no distension and no mass. There is no tenderness. There is no rebound and no guarding.  Musculoskeletal: Normal range of motion. She exhibits no edema or tenderness.  Lymphadenopathy:    She has no cervical adenopathy.  Neurological: She is oriented to person, place, and time.  Skin: Skin is warm and dry. No rash noted. She is not diaphoretic. No erythema. No pallor.  Vitals reviewed.         Assessment & Plan:

## 2014-08-01 ENCOUNTER — Encounter: Payer: Self-pay | Admitting: Cardiovascular Disease

## 2014-08-01 ENCOUNTER — Encounter: Payer: Self-pay | Admitting: Internal Medicine

## 2014-08-03 ENCOUNTER — Ambulatory Visit (HOSPITAL_COMMUNITY): Payer: Medicare Other | Attending: Cardiology | Admitting: Radiology

## 2014-08-03 DIAGNOSIS — R9431 Abnormal electrocardiogram [ECG] [EKG]: Secondary | ICD-10-CM | POA: Diagnosis not present

## 2014-08-03 DIAGNOSIS — I10 Essential (primary) hypertension: Secondary | ICD-10-CM | POA: Insufficient documentation

## 2014-08-03 DIAGNOSIS — R42 Dizziness and giddiness: Secondary | ICD-10-CM | POA: Diagnosis not present

## 2014-08-03 DIAGNOSIS — R0602 Shortness of breath: Secondary | ICD-10-CM | POA: Insufficient documentation

## 2014-08-03 DIAGNOSIS — R079 Chest pain, unspecified: Secondary | ICD-10-CM | POA: Diagnosis not present

## 2014-08-03 DIAGNOSIS — R072 Precordial pain: Secondary | ICD-10-CM

## 2014-08-03 MED ORDER — REGADENOSON 0.4 MG/5ML IV SOLN
0.4000 mg | Freq: Once | INTRAVENOUS | Status: AC
  Administered 2014-08-03: 0.4 mg via INTRAVENOUS

## 2014-08-03 MED ORDER — TECHNETIUM TC 99M SESTAMIBI GENERIC - CARDIOLITE
11.0000 | Freq: Once | INTRAVENOUS | Status: AC | PRN
  Administered 2014-08-03: 11 via INTRAVENOUS

## 2014-08-03 MED ORDER — AMINOPHYLLINE 25 MG/ML IV SOLN
75.0000 mg | Freq: Once | INTRAVENOUS | Status: AC
  Administered 2014-08-03: 75 mg via INTRAVENOUS

## 2014-08-03 MED ORDER — TECHNETIUM TC 99M SESTAMIBI GENERIC - CARDIOLITE
33.0000 | Freq: Once | INTRAVENOUS | Status: AC | PRN
  Administered 2014-08-03: 33 via INTRAVENOUS

## 2014-08-03 NOTE — Progress Notes (Signed)
[facility] [address] [place], [place] [address] [phone]    Cardiology Nuclear Med Study  Amanda Arnold is a 69 y.o. female     MRN : [id number]     DOB: Aug 24, 1945  Procedure Date: 08/03/2014  Nuclear Med Background Indication for Stress Test:  Evaluation for Ischemia and Abnormal EKG History:  MPI 2011 (normal) EF 67%, COPD Cardiac Risk Factors: Hypertension  Symptoms:  Chest Pain, Dizziness and SOB   Nuclear Pre-Procedure Caffeine/Decaff Intake:  None NPO After: 8:00pm   Lungs:  clear O2 Sat: 98% on room air. IV 0.9% NS with Angio Cath:  22g  IV Site: R Hand  IV Started by:  Crissie Figures, RN  Chest Size (in):  40 Cup Size: C  Height: 5\' 2"  (1.575 m)  Weight:  204 lb (92.534 kg)  BMI:  Body mass index is 37.3 kg/(m^2). Tech Comments:  n/a    Nuclear Med Study 1 or 2 day study: 1 day  Stress Test Type:  Lexiscan  Reading MD: n/a  Order Authorizing Provider:  Scarlette Calico, MD  Resting Radionuclide: Technetium 53m Sestamibi  Resting Radionuclide Dose: 11.0 mCi   Stress Radionuclide:  Technetium 45m Sestamibi  Stress Radionuclide Dose: 33.0 mCi           Stress Protocol Rest HR: 68 Stress HR: 101  Rest BP: 116/54 Stress BP: 109/41  Exercise Time (min): n/a METS: n/a   Predicted Max HR: 154 bpm % Max HR: 65.58 bpm Rate Pressure Product: 12423   Dose of Adenosine (mg):  n/a Dose of Lexiscan: 0.4 mg  Dose of Atropine (mg): n/a Dose of Dobutamine: n/a mcg/kg/min (at max HR)  Stress Test Technologist: Glade Lloyd, BS-ES  Nuclear Technologist:  Earl Many, CNMT     Rest Procedure:  Myocardial perfusion imaging was performed at rest 45 minutes following the intravenous administration of Technetium 62m Sestamibi. Rest ECG:SR 68 bpm   Stress Procedure:  The patient received IV Lexiscan 0.4 mg over 15-seconds.  Technetium 89m Sestamibi injected at 30-seconds.  Quantitative spect images were obtained after a 45 minute delay.   During the infusion of Lexiscan the patient complained of SOB, arm weakness, headache, chest pain and stomach ache.  These symptoms did not resolve until after 75 mg aminophylline was given.  Stress ECG: No significant change from baseline ECG  QPS Raw Data Images:  Soft tissue (diaphragm, bowel activity, breast) surround heart.   Stress Images: Mild thinning in the distal anterior wall otherwise normal perfusion.   Rest Images:  No significant change from the stress images.   Subtraction (SDS):  No significant ischemia   Transient Ischemic Dilatation (Normal <1.22):  1.02 Lung/Heart Ratio (Normal <0.45):  0.39  Quantitative Gated Spect Images QGS EDV:  64 ml QGS ESV:  23 ml  Impression Exercise Capacity:  Lexiscan with no exercise. BP Response:  Normal blood pressure response. Clinical Symptoms:  Mild chest pain/dyspnea. ECG Impression:  No significant ST segment change suggestive of ischemia. Comparison with Prior Nuclear Study: Prior scan in 2011 normal  Overall Impression:  Normal stress nuclear study.  LV Ejection Fraction: 64%.  LV Wall Motion:  NL LV Function; NL Wall Motion   [name]

## 2014-08-06 ENCOUNTER — Encounter: Payer: Self-pay | Admitting: Internal Medicine

## 2014-08-15 ENCOUNTER — Telehealth: Payer: Self-pay | Admitting: *Deleted

## 2014-08-15 ENCOUNTER — Telehealth: Payer: Self-pay | Admitting: Internal Medicine

## 2014-08-15 NOTE — Telephone Encounter (Signed)
Called pt and informed needs to gets heart and breathing issues resolved either thru PCP or cardio then to call and RS PV and colon. Pt states she understands and agrees with this plan of action.  She has made a call to Dr Plotinkov's nurse and plans to make Ov with him to discuss and get resolved, PV and colon canceled. Lelan Pons PV

## 2014-08-15 NOTE — Telephone Encounter (Signed)
Pt has PV for Friday 08-18-14 and colon is scheduled for 2-19 Friday  Thanks

## 2014-08-15 NOTE — Telephone Encounter (Signed)
Spoke with Amanda Arnold. She states she went to eye dr that 07-25-14. Eye dr sent for stress test but has not seen cardiologist . Eye dr sent because she needs to have cataract surgery  Amanda Arnold states she is still having chest pain, she has some shortness of breath, pain does not radiate to arm or jaw. She states she has pain and burning in chest, she states her heart cannot catch up with her breathing.  Informed Amanda Arnold she may need to get cardiac clearance prior to her colonoscopy. Dr Henrene Pastor, Do you want her to have an office visit with you or APP or wait to have cardiac clearance? Please advise Thanks for your time, Marijean Niemann

## 2014-08-15 NOTE — Telephone Encounter (Signed)
Pt request result of the stress test so they can go ahead and process with eye doctor.

## 2014-08-15 NOTE — Telephone Encounter (Signed)
Before scheduling elective screening colonoscopy, this patient should have all of her chest pain and breathing issues resolved. Previsit can get a note from her PCP when she has completed appropriate evaluations and clearance has occurred.

## 2014-08-15 NOTE — Telephone Encounter (Signed)
John Can you look at this lady's chart and make sure she is ok for [place]. She had an ED visit 07-25-2014 with chest pain but it appears they said was atypical, more burning with a headache as well. She had a myoview with an Ef of 67%. Hx of DM, htn, copd, tachycardia. Just want your opinion. I do not see where she had been sent to cardio, just has a follow up with plotnikov. Just wasn't sure if we need to get cardiac clearance prior to her colon.  Thanks for your time, Marijean Niemann

## 2014-08-16 NOTE — Telephone Encounter (Signed)
pls get a report Thx

## 2014-08-17 NOTE — Telephone Encounter (Signed)
Normal Pls fax to her eye doctor Thx

## 2014-08-17 NOTE — Telephone Encounter (Signed)
See 08/03/14 Clinical Support encounter. Please advise about eye doctor

## 2014-08-18 NOTE — Telephone Encounter (Signed)
Left detailed mess informing pt. Need fax number for eye doctor.

## 2014-08-22 ENCOUNTER — Encounter: Payer: Self-pay | Admitting: Internal Medicine

## 2014-08-22 ENCOUNTER — Telehealth: Payer: Self-pay | Admitting: Internal Medicine

## 2014-08-22 ENCOUNTER — Ambulatory Visit (INDEPENDENT_AMBULATORY_CARE_PROVIDER_SITE_OTHER): Payer: Medicare Other | Admitting: Internal Medicine

## 2014-08-22 VITALS — BP 130/88 | HR 79 | Temp 98.1°F | Wt 208.0 lb

## 2014-08-22 DIAGNOSIS — IMO0001 Reserved for inherently not codable concepts without codable children: Secondary | ICD-10-CM

## 2014-08-22 DIAGNOSIS — E119 Type 2 diabetes mellitus without complications: Secondary | ICD-10-CM | POA: Diagnosis not present

## 2014-08-22 DIAGNOSIS — I10 Essential (primary) hypertension: Secondary | ICD-10-CM | POA: Diagnosis not present

## 2014-08-22 DIAGNOSIS — G4733 Obstructive sleep apnea (adult) (pediatric): Secondary | ICD-10-CM | POA: Diagnosis not present

## 2014-08-22 DIAGNOSIS — M791 Myalgia: Secondary | ICD-10-CM

## 2014-08-22 DIAGNOSIS — G4700 Insomnia, unspecified: Secondary | ICD-10-CM

## 2014-08-22 DIAGNOSIS — M609 Myositis, unspecified: Secondary | ICD-10-CM

## 2014-08-22 DIAGNOSIS — M5440 Lumbago with sciatica, unspecified side: Secondary | ICD-10-CM

## 2014-08-22 MED ORDER — VITAMIN D3 50 MCG (2000 UT) PO CAPS: 2000.0000 [IU] | ORAL_CAPSULE | Freq: Every day | ORAL | Status: DC

## 2014-08-22 NOTE — Assessment & Plan Note (Signed)
Loose wt Continue with current prescription therapy as reflected on the Med list.

## 2014-08-22 NOTE — Telephone Encounter (Signed)
Pt was given copies of normal stress test at 08/22/14 OV.

## 2014-08-22 NOTE — Assessment & Plan Note (Signed)
Continue with current prescription therapy as reflected on the Med list.  

## 2014-08-22 NOTE — Assessment & Plan Note (Signed)
Chronic   Potential benefits of a long term opioids use as well as potential risks (i.e. addiction risk, apnea etc) and complications (i.e. Somnolence, constipation and others) were explained to the patient and were aknowledged. 

## 2014-08-22 NOTE — Patient Instructions (Signed)
Loose weight - low carb diet

## 2014-08-22 NOTE — Assessment & Plan Note (Signed)
Continue with current prn prescription therapy as reflected on the Med list.  

## 2014-08-22 NOTE — Telephone Encounter (Signed)
Pt is sch'd for Pre-op on 08-23-14 and Colonoscopy with Dr. Henrene Pastor on 08-28-14 Pt was notified of appts

## 2014-08-22 NOTE — Progress Notes (Signed)
Pre visit review using our clinic review tool, if applicable. No additional management support is needed unless otherwise documented below in the visit note. 

## 2014-08-22 NOTE — Assessment & Plan Note (Signed)
2015 Not using a CPAP - she took it back Risks discussed

## 2014-08-22 NOTE — Telephone Encounter (Signed)
Patient has "been released by PCP" for colonoscopy. Should she have an OV? LEC ok? Please, advise.

## 2014-08-22 NOTE — Telephone Encounter (Signed)
Please, schedule for direct colonoscopy with Dr. Henrene Pastor.

## 2014-08-22 NOTE — Telephone Encounter (Signed)
It appears this is a Dr. Henrene Pastor patient according to previous telephone note Will forward to him

## 2014-08-22 NOTE — Telephone Encounter (Signed)
She can be direct for [place] with previsit nurse since she has been cleared. Thanks

## 2014-08-22 NOTE — Assessment & Plan Note (Signed)
Not using a CPAP - she took it back

## 2014-08-22 NOTE — Progress Notes (Signed)
   Subjective:  F/u fatigue, aches and pains, LBP and HAs all the time - old complaints No CP F/u pain in R shoulder blade w/ROM  - better Overall doing OK Quit smoking 1/15 - not smoking  Leg Pain     The patient is here to follow up on chronic HTN- better, depression, anxiety, headaches and chronic moderate fibromyalgia/LBP symptoms.   Wt Readings from Last 3 Encounters:  08/22/14 208 lb (94.348 kg)  08/03/14 204 lb (92.534 kg)  07/27/14 205 lb (92.987 kg)   BP Readings from Last 3 Encounters:  08/22/14 130/88  08/03/14 116/54  07/27/14 112/68      Review of Systems  Constitutional: Positive for unexpected weight change. Negative for chills, activity change and appetite change.  HENT: Negative for mouth sores.   Eyes: Negative for visual disturbance.  Respiratory: Negative for chest tightness.   Genitourinary: Negative for frequency, difficulty urinating and vaginal pain.  Musculoskeletal: Positive for arthralgias. Negative for gait problem.  Skin: Negative for pallor.  Neurological: Negative for tremors.  Psychiatric/Behavioral: Positive for sleep disturbance. Negative for suicidal ideas and confusion. The patient is nervous/anxious.         Objective:   Physical Exam  Constitutional: She appears well-developed. No distress.  HENT:  Head: Normocephalic.  Right Ear: External ear normal.  Left Ear: External ear normal.  Nose: Nose normal.  Mouth/Throat: Oropharynx is clear and moist.  Eyes: Conjunctivae are normal. Pupils are equal, round, and reactive to light. Right eye exhibits no discharge. Left eye exhibits no discharge.  Neck: Normal range of motion. Neck supple. No JVD present. No tracheal deviation present. No thyromegaly present.  Cardiovascular: Normal rate, regular rhythm and normal heart sounds.   Pulmonary/Chest: No stridor. No respiratory distress. She has no wheezes.  Abdominal: Soft. Bowel sounds are normal. She exhibits no distension and no  mass. There is no tenderness. There is no rebound and no guarding.  Musculoskeletal: She exhibits no edema or tenderness.  Lymphadenopathy:    She has no cervical adenopathy.  Neurological: She displays normal reflexes. No cranial nerve deficit. She exhibits normal muscle tone. Coordination normal.  Skin: No rash noted. No erythema.  Psychiatric: She has a normal mood and affect. Her behavior is normal. Judgment and thought content normal.  Acne, moles on face Intertrigo rash - resolved R knee flexors, extensors 5-/5 R knee DTR a little decreased Obese  Lab Results  Component Value Date   WBC 5.2 07/25/2014   HGB 13.1 07/25/2014   HCT 39.3 07/25/2014   PLT 179 07/25/2014   GLUCOSE 101* 07/25/2014   CHOL 183 04/21/2014   TRIG 155.0* 04/21/2014   HDL 44.00 04/21/2014   LDLCALC 108* 04/21/2014   ALT 34 04/21/2014   AST 32 04/21/2014   NA 140 07/25/2014   K 4.3 07/25/2014   CL 108 07/25/2014   CREATININE 0.66 07/25/2014   BUN 16 07/25/2014   CO2 25 07/25/2014   TSH 0.38 10/05/2013   HGBA1C 5.9 04/21/2014           Assessment & Plan:

## 2014-08-23 ENCOUNTER — Ambulatory Visit: Payer: Medicare Other | Admitting: Internal Medicine

## 2014-08-23 ENCOUNTER — Ambulatory Visit (AMBULATORY_SURGERY_CENTER): Payer: Self-pay

## 2014-08-23 VITALS — Ht 62.0 in | Wt 207.0 lb

## 2014-08-23 DIAGNOSIS — Z1211 Encounter for screening for malignant neoplasm of colon: Secondary | ICD-10-CM

## 2014-08-23 MED ORDER — MOVIPREP 100 G PO SOLR: 1.0000 | Freq: Once | ORAL | Status: DC

## 2014-08-23 NOTE — Progress Notes (Signed)
Has OSA couldn't tolerate CPAP No allergies to eggs or soy No diet/weight loss meds No home oxygen No past problems with anesthesia (following back surgery when she was young had a hard time getting "awake")  No internet

## 2014-08-28 ENCOUNTER — Ambulatory Visit (AMBULATORY_SURGERY_CENTER): Payer: Medicare Other | Admitting: Internal Medicine

## 2014-08-28 ENCOUNTER — Encounter: Payer: Self-pay | Admitting: Internal Medicine

## 2014-08-28 VITALS — BP 111/63 | HR 74 | Temp 96.4°F | Resp 20 | Ht 62.0 in | Wt 207.0 lb

## 2014-08-28 DIAGNOSIS — D124 Benign neoplasm of descending colon: Secondary | ICD-10-CM

## 2014-08-28 DIAGNOSIS — G8929 Other chronic pain: Secondary | ICD-10-CM | POA: Diagnosis not present

## 2014-08-28 DIAGNOSIS — Z1211 Encounter for screening for malignant neoplasm of colon: Secondary | ICD-10-CM

## 2014-08-28 DIAGNOSIS — G4733 Obstructive sleep apnea (adult) (pediatric): Secondary | ICD-10-CM | POA: Diagnosis not present

## 2014-08-28 DIAGNOSIS — D123 Benign neoplasm of transverse colon: Secondary | ICD-10-CM

## 2014-08-28 MED ORDER — SODIUM CHLORIDE 0.9 % IV SOLN
500.0000 mL | INTRAVENOUS | Status: DC
Start: 2014-08-28 — End: 2014-08-28

## 2014-08-28 NOTE — Progress Notes (Signed)
A/ox3 pleased with MAC, report to Celia RN 

## 2014-08-28 NOTE — Op Note (Signed)
[facility]  [address]. [place], [address]   COLONOSCOPY PROCEDURE REPORT  PATIENT: Amanda Arnold, Amanda Arnold  MR#: [id number] BIRTHDATE: [birth year] , 41  yrs. old GENDER: female ENDOSCOPIST: Eustace Quail, MD REFERRED WC:HENI Avel Sensor, M.D. PROCEDURE DATE:  08/28/2014 PROCEDURE:   Colonoscopy with snare polypectomy x 2 First Screening Colonoscopy - Avg.  risk and is 50 yrs.  old or older Yes.  Prior Negative Screening - Now for repeat screening. N/A  History of Adenoma - Now for follow-up colonoscopy & has been > or = to 3 yrs.  N/A  Polyps Removed Today? Yes. ASA CLASS:   Class II INDICATIONS:average risk patient for colorectal cancer. MEDICATIONS: Monitored anesthesia care and Propofol 250 mg IV  DESCRIPTION OF PROCEDURE:   After the risks benefits and alternatives of the procedure were thoroughly explained, informed consent was obtained.  The digital rectal exam revealed no abnormalities of the rectum.   The LB [id number]  endoscope was introduced through the anus and advanced to the cecum, which was identified by both the appendix and ileocecal valve. No adverse events experienced.   The quality of the prep was excellent, using MoviPrep  The instrument was then slowly withdrawn as the colon was fully examined.  COLON FINDINGS: Two adenomatous appearing polyps ranging between 3-78mm in size were found in the descending colon and transverse colon.  A polypectomy was performed with a cold snare.  The resection was complete, the polyp tissue was completely retrieved and sent to histology.   There was mild diverticulosis noted in the sigmoid colon.   The examination was otherwise normal.  Retroflexed views revealed internal hemorrhoids. The time to cecum=2 minutes 18 seconds.  Withdrawal time=11 minutes 20 seconds.  The scope was withdrawn and the procedure completed. COMPLICATIONS: There were no immediate complications.  ENDOSCOPIC IMPRESSION: 1.   Two  polyps were found in the descending colon and transverse colon; polypectomy was performed with a cold snare 2.   Mild diverticulosis was noted in the sigmoid colon 3.   The examination was otherwise normal  RECOMMENDATIONS: 1. Follow up colonoscopy in 5 years  eSigned:  Eustace Quail, MD 08/28/2014 10:18 AM   cc: [place].  Plotnikov, MD and The Patient

## 2014-08-28 NOTE — Patient Instructions (Signed)
Discharge instructions given. Handouts on polyps and diverticulosis. Resume previous medications. YOU HAD AN ENDOSCOPIC PROCEDURE TODAY AT THE [facility]: Refer to the procedure report that was given to you for any specific questions about what was found during the examination.  If the procedure report does not answer your questions, please call your gastroenterologist to clarify.  If you requested that your care partner not be given the details of your procedure findings, then the procedure report has been included in a sealed envelope for you to review at your convenience later.  YOU SHOULD EXPECT: Some feelings of bloating in the abdomen. Passage of more gas than usual.  Walking can help get rid of the air that was put into your GI tract during the procedure and reduce the bloating. If you had a lower endoscopy (such as a colonoscopy or flexible sigmoidoscopy) you may notice spotting of blood in your stool or on the toilet paper. If you underwent a bowel prep for your procedure, then you may not have a normal bowel movement for a few days.  DIET: Your first meal following the procedure should be a light meal and then it is ok to progress to your normal diet.  A half-sandwich or bowl of soup is an example of a good first meal.  Heavy or fried foods are harder to digest and may make you feel nauseous or bloated.  Likewise meals heavy in dairy and vegetables can cause extra gas to form and this can also increase the bloating.  Drink plenty of fluids but you should avoid alcoholic beverages for 24 hours.  ACTIVITY: Your care partner should take you home directly after the procedure.  You should plan to take it easy, moving slowly for the rest of the day.  You can resume normal activity the day after the procedure however you should NOT DRIVE or use heavy machinery for 24 hours (because of the sedation medicines used during the test).    SYMPTOMS TO REPORT IMMEDIATELY: A gastroenterologist  can be reached at any hour.  During normal business hours, 8:30 AM to 5:00 PM Monday through Friday, call [phone].  After hours and on weekends, please call the GI answering service at [phone] who will take a message and have the physician on call contact you.   Following lower endoscopy (colonoscopy or flexible sigmoidoscopy):  Excessive amounts of blood in the stool  Significant tenderness or worsening of abdominal pains  Swelling of the abdomen that is new, acute  Fever of 100F or higher  FOLLOW UP: If any biopsies were taken you will be contacted by phone or by letter within the next 1-3 weeks.  Call your gastroenterologist if you have not heard about the biopsies in 3 weeks.  Our staff will call the home number listed on your records the next business day following your procedure to check on you and address any questions or concerns that you may have at that time regarding the information given to you following your procedure. This is a courtesy call and so if there is no answer at the home number and we have not heard from you through the emergency physician on call, we will assume that you have returned to your regular daily activities without incident.  SIGNATURES/CONFIDENTIALITY: You and/or your care partner have signed paperwork which will be entered into your electronic medical record.  These signatures attest to the fact that that the information above on your After Visit Summary has been reviewed   and is understood.  Full responsibility of the confidentiality of this discharge information lies with you and/or your care-partner. 

## 2014-08-28 NOTE — Progress Notes (Signed)
Called to room to assist during endoscopic procedure.  Patient ID and intended procedure confirmed with present staff. Received instructions for my participation in the procedure from the performing physician.  

## 2014-08-29 ENCOUNTER — Telehealth: Payer: Self-pay | Admitting: *Deleted

## 2014-08-29 NOTE — Telephone Encounter (Signed)
  Follow up Call-  Call back number 08/28/2014  Permission to leave phone message Yes     Patient questions:  Do you have a fever, pain , or abdominal swelling? No. Pain Score  0 *  Have you tolerated food without any problems? Yes.    Have you been able to return to your normal activities? Yes.    Do you have any questions about your discharge instructions: Diet   No. Medications  No. Follow up visit  No.  Do you have questions or concerns about your Care? No.  Actions: * If pain score is 4 or above: No action needed, pain <4.

## 2014-09-01 ENCOUNTER — Encounter: Payer: PRIVATE HEALTH INSURANCE | Admitting: Internal Medicine

## 2014-09-01 ENCOUNTER — Encounter: Payer: Self-pay | Admitting: Internal Medicine

## 2014-09-06 DIAGNOSIS — H25011 Cortical age-related cataract, right eye: Secondary | ICD-10-CM | POA: Diagnosis not present

## 2014-09-06 DIAGNOSIS — H25811 Combined forms of age-related cataract, right eye: Secondary | ICD-10-CM | POA: Diagnosis not present

## 2014-09-06 DIAGNOSIS — E119 Type 2 diabetes mellitus without complications: Secondary | ICD-10-CM | POA: Diagnosis not present

## 2014-09-06 DIAGNOSIS — H2511 Age-related nuclear cataract, right eye: Secondary | ICD-10-CM | POA: Diagnosis not present

## 2014-09-11 ENCOUNTER — Ambulatory Visit (INDEPENDENT_AMBULATORY_CARE_PROVIDER_SITE_OTHER)
Admission: RE | Admit: 2014-09-11 | Discharge: 2014-09-11 | Disposition: A | Discharge status: Home / Self Care | Payer: Medicare Other | Source: Ambulatory Visit | Attending: Family | Admitting: Family

## 2014-09-11 ENCOUNTER — Ambulatory Visit (INDEPENDENT_AMBULATORY_CARE_PROVIDER_SITE_OTHER): Payer: Medicare Other | Admitting: Family

## 2014-09-11 ENCOUNTER — Encounter: Payer: Self-pay | Admitting: Family

## 2014-09-11 VITALS — BP 132/72 | HR 77 | Temp 98.4°F | Resp 16 | Ht 62.0 in | Wt 205.8 lb

## 2014-09-11 DIAGNOSIS — R1031 Right lower quadrant pain: Secondary | ICD-10-CM | POA: Diagnosis not present

## 2014-09-11 DIAGNOSIS — M5440 Lumbago with sciatica, unspecified side: Secondary | ICD-10-CM | POA: Diagnosis not present

## 2014-09-11 DIAGNOSIS — R21 Rash and other nonspecific skin eruption: Secondary | ICD-10-CM | POA: Diagnosis not present

## 2014-09-11 DIAGNOSIS — D259 Leiomyoma of uterus, unspecified: Secondary | ICD-10-CM | POA: Diagnosis not present

## 2014-09-11 MED ORDER — METHYLPREDNISOLONE (PAK) 4 MG PO TABS: ORAL_TABLET | ORAL | Status: DC

## 2014-09-11 MED ORDER — SULFAMETHOXAZOLE-TRIMETHOPRIM 800-160 MG PO TABS: 1.0000 | ORAL_TABLET | Freq: Two times a day (BID) | ORAL | Status: DC

## 2014-09-11 NOTE — Assessment & Plan Note (Signed)
Patient continues to experience sciatic-like pain. Start methylprednisolone to decrease inflammation. Continue other previously prescribed medications. Follow-up if symptoms worsen or fail to improve.

## 2014-09-11 NOTE — Progress Notes (Signed)
Subjective:    Patient ID: Amanda Arnold, female    DOB: [birth year], 69 y.o.   MRN: [id number]  Chief Complaint  Patient presents with  . Flank Pain    Right side pain x2 days, Right leg pain as well for a little longer, and she has also had diarrhea that started today    HPI:  Amanda Arnold is a 70 y.o. female who presents today for an acute visit.  1) Right side pain - This is a new problem. Associated symptom of right side pain has been going on for about 3-4 days. Pain is described as dull ache and the intensity is about a 6/10. Denies any treatments for it.  2 days and right leg pain that has been going on for a little longer.  2) Right leg pain - is an established problem that is worsening. Indicates the pain is located on her posterior gluteal that radiates down the back of her leg.    3) Boil - Notes a small boil just superior to her pubic bone that has been going on for several days. Denies any discharge.    Allergies  Allergen Reactions  . Morphine And Related Shortness Of Breath  . Bupropion Hcl Other (See Comments)    "does not feel right"  . Duloxetine Other (See Comments)    REACTION: did not feel good  . Ezetimibe Other (See Comments)    REACTION: legs burning  . Metformin And Related Diarrhea    Diarrhea w/XR or regular   . Trazodone Hcl Swelling  . Gabapentin Diarrhea and Other (See Comments)    Diarrhea, HA  . Tramadol Hcl Palpitations    REACTION: palpitations    Current Outpatient Prescriptions on File Prior to Visit  Medication Sig Dispense Refill  . ALPRAZolam (XANAX) 0.5 MG tablet TAKE 1 TABLET BY MOUTH TWICE DAILY 60 tablet 2  . Cholecalciferol (VITAMIN D3) 2000 UNITS capsule Take 1 capsule (2,000 Units total) by mouth daily. 100 capsule 3  . HYDROcodone-acetaminophen (NORCO) 7.5-325 MG per tablet Take 1 tablet by mouth every 8 (eight) hours as needed for moderate pain or severe pain. 90 tablet 0  . metoprolol succinate (TOPROL-XL) 25 MG 24  hr tablet Take 1 tablet (25 mg total) by mouth every morning. 30 tablet 11  . ranitidine (ZANTAC) 150 MG tablet Take 1 tablet (150 mg total) by mouth 2 (two) times daily. 60 tablet 0   No current facility-administered medications on file prior to visit.       Review of Systems  Constitutional: Positive for fever. Negative for chills.  Gastrointestinal: Positive for nausea. Negative for vomiting, abdominal pain, constipation and abdominal distention.      Objective:    BP 132/72 mmHg  Pulse 77  Temp(Src) 98.4 F (36.9 C) (Oral)  Resp 16  Ht 5\' 2"  (1.575 m)  Wt 205 lb 12.8 oz (93.35 kg)  BMI 37.63 kg/m2  SpO2 90% Nursing note and vital signs reviewed.  Physical Exam  Constitutional: She is oriented to person, place, and time. She appears well-developed and well-nourished. No distress.  Cardiovascular: Normal rate, regular rhythm, normal heart sounds and intact distal pulses.   Pulmonary/Chest: Effort normal and breath sounds normal.  Abdominal: Bowel sounds are normal. She exhibits no distension and no mass. There is tenderness in the right lower quadrant. There is no rigidity, no rebound, no guarding, no tenderness at McBurney's point and negative Murphy's sign.  Negative psoas and obtruator signs.  Neurological: She is alert and oriented to person, place, and time.  Skin: Skin is warm and dry.  Small boil noted inferior to umbilicus. No obvious drainage. Redness and tenderness present.  Psychiatric: She has a normal mood and affect. Her behavior is normal. Judgment and thought content normal.       Assessment & Plan:

## 2014-09-11 NOTE — Assessment & Plan Note (Signed)
Non-distinct right lower quadrant pain. Low suspicion for appendicitis. Obtain abdominal x-ray to rule out obstruction or free air. Obtain ultrasound to rule out kidney stone or cyst. Follow-up pending imaging and if symptoms worsen or fail to improve.

## 2014-09-11 NOTE — Assessment & Plan Note (Signed)
Rash appears consistent with boil. Start Bactrim to cover for MRSA. Follow-up if symptoms worsen or fail to improve.

## 2014-09-11 NOTE — Progress Notes (Signed)
Pre visit review using our clinic review tool, if applicable. No additional management support is needed unless otherwise documented below in the visit note. 

## 2014-09-11 NOTE — Patient Instructions (Signed)
Thank you for choosing [facility].  Summary/Instructions:  Your prescription(s) have been submitted to your pharmacy or been printed and provided for you. Please take as directed and contact our office if you believe you are having problem(s) with the medication(s) or have any questions.  Please stop by radiology on the basement level of the building for your x-rays. Your results will be released to [facility] (or called to you) after review, usually within 72 hours after test completion. If any treatments or changes are necessary, you will be notified at that same time.  Referrals have been made during this visit. You should expect to hear back from our schedulers in about 7-10 days in regards to establishing an appointment with the specialists we discussed.   If your symptoms worsen or fail to improve, please contact our office for further instruction, or in case of emergency go directly to the emergency room at the closest medical facility.

## 2014-09-12 ENCOUNTER — Encounter: Payer: Self-pay | Admitting: Family

## 2014-09-18 ENCOUNTER — Ambulatory Visit
Admission: RE | Admit: 2014-09-18 | Discharge: 2014-09-18 | Disposition: A | Discharge status: Home / Self Care | Payer: Medicare Other | Source: Ambulatory Visit | Attending: Family | Admitting: Family

## 2014-09-18 DIAGNOSIS — R1031 Right lower quadrant pain: Secondary | ICD-10-CM | POA: Diagnosis not present

## 2014-09-18 DIAGNOSIS — Z87442 Personal history of urinary calculi: Secondary | ICD-10-CM | POA: Diagnosis not present

## 2014-09-18 DIAGNOSIS — Z9049 Acquired absence of other specified parts of digestive tract: Secondary | ICD-10-CM | POA: Diagnosis not present

## 2014-09-20 ENCOUNTER — Telehealth: Payer: Self-pay | Admitting: Family

## 2014-09-20 NOTE — Telephone Encounter (Signed)
Please inform the patient that her abdominal ultrasound and x-ray results were both normal, with the x-ray showing a uterine fibroid which could be potentially causing her pain. I would recommend follow up with her GYN if needed.

## 2014-09-21 NOTE — Telephone Encounter (Signed)
Pt aware of results and will follow up with GYN if needed.

## 2014-09-22 ENCOUNTER — Encounter: Payer: Self-pay | Admitting: Internal Medicine

## 2014-09-22 ENCOUNTER — Ambulatory Visit (INDEPENDENT_AMBULATORY_CARE_PROVIDER_SITE_OTHER): Payer: Medicare Other | Admitting: Internal Medicine

## 2014-09-22 VITALS — BP 120/70 | HR 67 | Wt 205.0 lb

## 2014-09-22 DIAGNOSIS — I10 Essential (primary) hypertension: Secondary | ICD-10-CM | POA: Diagnosis not present

## 2014-09-22 DIAGNOSIS — M5430 Sciatica, unspecified side: Secondary | ICD-10-CM

## 2014-09-22 DIAGNOSIS — Z720 Tobacco use: Secondary | ICD-10-CM

## 2014-09-22 DIAGNOSIS — M791 Myalgia: Secondary | ICD-10-CM

## 2014-09-22 DIAGNOSIS — F17200 Nicotine dependence, unspecified, uncomplicated: Secondary | ICD-10-CM

## 2014-09-22 DIAGNOSIS — E119 Type 2 diabetes mellitus without complications: Secondary | ICD-10-CM

## 2014-09-22 DIAGNOSIS — M609 Myositis, unspecified: Secondary | ICD-10-CM

## 2014-09-22 DIAGNOSIS — IMO0001 Reserved for inherently not codable concepts without codable children: Secondary | ICD-10-CM

## 2014-09-22 MED ORDER — HYDROCODONE-ACETAMINOPHEN 7.5-325 MG PO TABS: 1.0000 | ORAL_TABLET | Freq: Three times a day (TID) | ORAL | Status: DC | PRN

## 2014-09-22 NOTE — Assessment & Plan Note (Signed)
Chronic FMS/OA/LBP  Potential benefits of a long term opioids use as well as potential risks (i.e. addiction risk, apnea etc) and complications (i.e. Somnolence, constipation and others) were explained to the patient and were aknowledged. On Norco prn

## 2014-09-22 NOTE — Progress Notes (Signed)
Subjective:  C/o R buttock and R thigh pain  F/u fatigue, aches and pains, LBP and HAs all the time - old complaints No CP F/u pain in R shoulder blade w/ROM  - better Overall doing OK Quit smoking 1/15 - not smoking  Leg Pain  The incident occurred more than 1 week ago. The pain is present in the right hip, right thigh and right leg. The pain is moderate. Pertinent negatives include no inability to bear weight, loss of motion, muscle weakness, numbness or tingling. The symptoms are aggravated by movement and weight bearing. She has tried NSAIDs, rest and acetaminophen for the symptoms. The treatment provided mild relief.    The patient is here to follow up on chronic HTN- better, depression, anxiety, headaches and chronic moderate fibromyalgia/LBP symptoms.   Wt Readings from Last 3 Encounters:  09/22/14 205 lb (92.987 kg)  09/11/14 205 lb 12.8 oz (93.35 kg)  08/28/14 207 lb (93.895 kg)   BP Readings from Last 3 Encounters:  09/22/14 120/70  09/11/14 132/72  08/28/14 111/63      Review of Systems  Constitutional: Positive for unexpected weight change. Negative for chills, activity change and appetite change.  HENT: Negative for mouth sores.   Eyes: Negative for visual disturbance.  Respiratory: Negative for chest tightness.   Genitourinary: Negative for frequency, difficulty urinating and vaginal pain.  Musculoskeletal: Positive for arthralgias. Negative for gait problem.  Skin: Negative for pallor.  Neurological: Negative for tingling, tremors and numbness.  Psychiatric/Behavioral: Positive for sleep disturbance. Negative for suicidal ideas and confusion. The patient is nervous/anxious.         Objective:   Physical Exam  Constitutional: She appears well-developed. No distress.  HENT:  Head: Normocephalic.  Right Ear: External ear normal.  Left Ear: External ear normal.  Nose: Nose normal.  Mouth/Throat: Oropharynx is clear and moist.  Eyes: Conjunctivae are  normal. Pupils are equal, round, and reactive to light. Right eye exhibits no discharge. Left eye exhibits no discharge.  Neck: Normal range of motion. Neck supple. No JVD present. No tracheal deviation present. No thyromegaly present.  Cardiovascular: Normal rate, regular rhythm and normal heart sounds.   Pulmonary/Chest: No stridor. No respiratory distress. She has no wheezes.  Abdominal: Soft. Bowel sounds are normal. She exhibits no distension and no mass. There is no tenderness. There is no rebound and no guarding.  Musculoskeletal: She exhibits no edema or tenderness.  Lymphadenopathy:    She has no cervical adenopathy.  Neurological: She displays normal reflexes. No cranial nerve deficit. She exhibits normal muscle tone. Coordination normal.  Skin: No rash noted. No erythema.  Psychiatric: She has a normal mood and affect. Her behavior is normal. Judgment and thought content normal.  Acne, moles on face Intertrigo rash - resolved R buttock is tender; str leg elev is (-) B; MS ok B LEs R knee flexors, extensors 5-/5 R knee DTR a little decreased Obese  Lab Results  Component Value Date   WBC 5.2 07/25/2014   HGB 13.1 07/25/2014   HCT 39.3 07/25/2014   PLT 179 07/25/2014   GLUCOSE 101* 07/25/2014   CHOL 183 04/21/2014   TRIG 155.0* 04/21/2014   HDL 44.00 04/21/2014   LDLCALC 108* 04/21/2014   ALT 34 04/21/2014   AST 32 04/21/2014   NA 140 07/25/2014   K 4.3 07/25/2014   CL 108 07/25/2014   CREATININE 0.66 07/25/2014   BUN 16 07/25/2014   CO2 25 07/25/2014   TSH  0.38 10/05/2013   HGBA1C 5.9 04/21/2014           Assessment & Plan:

## 2014-09-22 NOTE — Assessment & Plan Note (Signed)
Not on meds Labs

## 2014-09-22 NOTE — Assessment & Plan Note (Signed)
On Toprol XL

## 2014-09-22 NOTE — Patient Instructions (Signed)
Piriformis Syndrome with Rehab Piriformis syndrome is a condition the affects the nervous system in the area of the hip, and is characterized by pain and possibly a loss of feeling in the backside (posterior) thigh that may extend down the entire length of the leg. The symptoms are caused by an increase in pressure on the sciatic nerve by the piriformis muscle, which is on the back of the hip and is responsible for externally rotating the hip. The sciatic nerve and its branches connect to much of the leg. Normally the sciatic nerve runs between the piriformis muscle and other muscles. However, in certain individuals the nerve runs through the muscle, which causes an increase in pressure on the nerve and results in the symptoms of piriformis syndrome. SYMPTOMS   Pain, tingling, numbness, or burning in the back of the thigh that may also extend down the entire leg.  Occasionally, tenderness in the buttock.  Loss of function of the leg.  Pain that worsens when using the piriformis muscle (running, jumping, or stairs).  Pain that increases with prolonged sitting.  Pain that is lessened by lying flat on the back. CAUSES   Piriformis syndrome is the result of an increase in pressure placed on the sciatic nerve. Oftentimes, piriformis syndrome is an overuse injury.  Stress placed on the nerve from a sudden increase in the intensity, frequency, or duration of training.  Compensation of other extremity injuries. RISK INCREASES WITH:  Sports that involve the piriformis muscle (running, walking, or jumping).  You are born with (congenital) a defect in which the sciatic nerve passes through the muscle. PREVENTION  Warm up and stretch properly before activity.  Allow for adequate recovery between workouts.  Maintain physical fitness:  Strength, flexibility, and endurance.  Cardiovascular fitness. PROGNOSIS  If treated properly, the symptoms of piriformis syndrome usually resolve in 2 to 6  weeks. RELATED COMPLICATIONS   Persistent and possibly permanent pain and numbness in the lower extremity.  Weakness of the extremity that may progress to disability and inability to compete. TREATMENT  The most effective treatment for piriformis syndrome is rest from any activities that aggravate the symptoms. Ice and pain medication may help reduce pain and inflammation. The use of strengthening and stretching exercises may help reduce pain with activity. These exercises may be performed at home or with a therapist. A referral to a therapist may be given for further evaluation and treatment, such as ultrasound. Corticosteroid injections may be given to reduce inflammation that is causing pressure to be placed on the sciatic nerve. If nonsurgical (conservative) treatment is unsuccessful, then surgery may be recommended.  MEDICATION   If pain medication is necessary, then nonsteroidal anti-inflammatory medications, such as aspirin and ibuprofen, or other minor pain relievers, such as acetaminophen, are often recommended.  Do not take pain medication for 7 days before surgery.  Prescription pain relievers may be given if deemed necessary by your caregiver. Use only as directed and only as much as you need.  Corticosteroid injections may be given by your caregiver. These injections should be reserved for the most serious cases, because they may only be given a certain number of times. HEAT AND COLD:   Cold treatment (icing) relieves pain and reduces inflammation. Cold treatment should be applied for 10 to 15 minutes every 2 to 3 hours for inflammation and pain and immediately after any activity that aggravates your symptoms. Use ice packs or massage the area with a piece of ice (ice massage).  Heat  treatment may be used prior to performing the stretching and strengthening activities prescribed by your caregiver, physical therapist, or athletic trainer. Use a heat pack or soak the injury in warm  water. SEEK IMMEDIATE MEDICAL CARE IF:  Treatment seems to offer no benefit, or the condition worsens.  Any medications produce adverse side effects. EXERCISES RANGE OF MOTION (ROM) AND STRETCHING EXERCISES - Piriformis Syndrome These exercises may help you when beginning to rehabilitate your injury. Your symptoms may resolve with or without further involvement from your physician, physical therapist, or athletic trainer. While completing these exercises, remember:   Restoring tissue flexibility helps normal motion to return to the joints. This allows healthier, less painful movement and activity.  An effective stretch should be held for at least 30 seconds.  A stretch should never be painful. You should only feel a gentle lengthening or release in the stretched tissue. STRETCH - Hip Rotators  Lie on your back on a firm surface. Grasp your right / left knee with your right / left hand and your ankle with your opposite hand.  Keeping your hips and shoulders firmly planted, gently pull your right / left knee and rotate your lower leg toward your opposite shoulder until you feel a stretch in your buttocks.  Hold this stretch for __________ seconds. Repeat this stretch __________ times. Complete this stretch __________ times per day. STRETCH - Iliotibial Band  On the floor or bed, lie on your side so your right / left leg is on top. Bend your knee and grab your ankle.  Slowly bring your knee back so that your thigh is in line with your trunk. Keep your heel at your buttocks and gently arch your back so your head, shoulders, and hips line up.  Slowly lower your leg so that your knee approaches the floor/bed until you feel a gentle stretch on the outside of your right / left thigh. If you do not feel a stretch and your knee will not fall farther, place the heel of your opposite foot on top of your knee and pull your thigh down farther.  Hold this stretch for __________ seconds. Repeat  __________ times. Complete __________ times per day. STRENGTHENING EXERCISES - Piriformis Syndrome  These are some of the caregiver again or until your symptoms are resolved. Remember:   Strong muscles with good endurance tolerate stress better.  Do the exercises as initially prescribed by your caregiver. Progress slowly with each exercise, gradually increasing the number of repetitions and weight used under their guidance. STRENGTH - Hip Abductors, Straight Leg Raises Be aware of your form throughout the entire exercise so that you exercise the correct muscles. Sloppy form means that you are not strengthening the correct muscles.  Lie on your side so that your head, shoulders, knee, and hip line up. You may bend your lower knee to help maintain your balance. Your right / left leg should be on top.  Roll your hips slightly forward, so that your hips are stacked directly over each other and your right / left knee is facing forward.  Lift your top leg up 4-6 inches, leading with your heel. Be sure that your foot does not drift forward or that your knee does not roll toward the ceiling.  Hold this position for __________ seconds. You should feel the muscles in your outer hip lifting (you may not notice this until your leg begins to tire).  Slowly lower your leg to the starting position. Allow the muscles to fully  relax before beginning the next repetition. Repeat __________ times. Complete this exercise __________ times per day.  STRENGTH - Hip Abductors, Quadruped  On a firm, lightly padded surface, position yourself on your hands and knees. Your hands should be directly below your shoulders and your knees should be directly below your hips.  Keeping your right / left knee bent, lift your leg out to the side. Keep your legs level and in line with your shoulders.  Position yourself on your hands and knees.  Hold for __________ seconds.  Keeping your trunk steady and your hips level, slowly  lower your leg to the starting position. Repeat __________ times. Complete this exercise __________ times per day.  STRENGTH - Hip Abductors, Standing  Tie one end of a rubber exercise band/tubing to a secure surface (table, pole) and tie a loop at the other end.  Place the loop around your right / left ankle. Keeping your ankle with the band directly opposite of the secured end, step away until there is tension in the tube/band.  Hold onto a chair as needed for balance.  Keeping your back upright, your shoulders over your hips, and your toes pointing forward, lift your right / left leg out to your side. Be sure to lift your leg with your hip muscles. Do not "throw" your leg or tip your body to lift your leg.  Slowly and with control, return to the starting position. Repeat exercise __________ times. Complete this exercise __________ times per day.  Document Released: 06/30/2005 Document Revised: 11/14/2013 Document Reviewed: 10/12/2008 [facility] Patient Information 2015 [place], [place]. This information is not intended to replace advice given to you by your health care provider. Make sure you discuss any questions you have with your health care provider.

## 2014-09-22 NOTE — Assessment & Plan Note (Signed)
3/16 R piriformis syndrome Stretch

## 2014-09-22 NOTE — Assessment & Plan Note (Signed)
Not smoking.  

## 2014-10-04 DIAGNOSIS — H25812 Combined forms of age-related cataract, left eye: Secondary | ICD-10-CM | POA: Diagnosis not present

## 2014-10-04 DIAGNOSIS — H25012 Cortical age-related cataract, left eye: Secondary | ICD-10-CM | POA: Diagnosis not present

## 2014-10-04 DIAGNOSIS — E119 Type 2 diabetes mellitus without complications: Secondary | ICD-10-CM | POA: Diagnosis not present

## 2014-10-04 DIAGNOSIS — H2512 Age-related nuclear cataract, left eye: Secondary | ICD-10-CM | POA: Diagnosis not present

## 2014-10-05 ENCOUNTER — Telehealth: Payer: Self-pay | Admitting: Internal Medicine

## 2014-10-05 NOTE — Telephone Encounter (Signed)
Spoke with Patients daughter.  Daughter does not get off work until after 4pm.  She is taking mother to urgent care or ER.

## 2014-10-05 NOTE — Telephone Encounter (Signed)
PLEASE NOTE: All timestamps contained within this report are represented as [place] Standard Time. CONFIDENTIALTY NOTICE: This fax transmission is intended only for the addressee. It contains information that is legally privileged, confidential or otherwise protected from use or disclosure. If you are not the intended recipient, you are strictly prohibited from reviewing, disclosing, copying using or disseminating any of this information or taking any action in reliance on or regarding this information. If you have received this fax in error, please notify us immediately by telephone so that we can arrange for its return to [place]. Phone: [phone], Toll-Free: [phone], Fax: [phone] Page: 1 of 1 Call Id: [id number] [facility] - Client [place] Patient Name: Belita Coffie DOB: Dec 05, 1945 Initial Comment Caller States mother has pain on her lower right side, has a knot on the outside of her vagina. hurts when she moves around and says it is really painful. Nurse Assessment Nurse: Donalynn Furlong, RN, Myna Hidalgo Date/Time Eilene Ghazi Time): 10/05/2014 11:05:16 AM Confirm and document reason for call. If symptomatic, describe symptoms. ---Caller States mother has pain on her lower front right side, has a knot on the outside of her vagina. hurts when she moves around and says it is really painful. Afebrile. Has the patient traveled out of the country within the last 30 days? ---No Does the patient require triage? ---Yes Related visit to physician within the last 2 weeks? ---Yes Does the PT have any chronic conditions? (i.e. diabetes, asthma, etc.) ---Yes List chronic conditions. ---diabetes, high blood pressure, sleep apnea Guidelines Guideline Title Affirmed Question Affirmed Notes Abdominal Pain - Female [1] MILD-MODERATE pain AND [2] constant AND [3] present > 2 hours Final Disposition User See Physician within 4 Hours (or  PCP triage) Donalynn Furlong, RN, Myna Hidalgo Comments unable to obtain 4 hr outcome appt today. Daughter states she will take her to an urgent care

## 2014-10-05 NOTE — Telephone Encounter (Signed)
Pls see if other [facility] offices could see her Thx

## 2014-10-05 NOTE — Telephone Encounter (Signed)
Patient's daughter called in b/c patient was having right side pain and a knot in her private area that was very painful.  Stated that they were waiting on a referral to an OBGYN.  After checking office notes and speaking with [place] we found out that she was to make an appointment with her OBGYN.  Gave daughter Lynnell Chad to call.  Daughter wanted patient to be seen today.  We do not have any openings.  Did refer patient to try ER.  Please advise if she could be worked in today or if the ER would be where she needs to go today?

## 2014-10-06 ENCOUNTER — Encounter (HOSPITAL_COMMUNITY): Payer: Self-pay | Admitting: Family Medicine

## 2014-10-06 ENCOUNTER — Emergency Department (INDEPENDENT_AMBULATORY_CARE_PROVIDER_SITE_OTHER)
Admission: EM | Admit: 2014-10-06 | Discharge: 2014-10-06 | Disposition: A | Discharge status: Home / Self Care | Payer: Medicare Other | Source: Home / Self Care | Attending: Family Medicine | Admitting: Family Medicine

## 2014-10-06 DIAGNOSIS — L739 Follicular disorder, unspecified: Secondary | ICD-10-CM

## 2014-10-06 MED ORDER — SULFAMETHOXAZOLE-TRIMETHOPRIM 800-160 MG PO TABS: 1.0000 | ORAL_TABLET | Freq: Two times a day (BID) | ORAL | Status: DC

## 2014-10-06 MED ORDER — FLUCONAZOLE 150 MG PO TABS: 150.0000 mg | ORAL_TABLET | Freq: Every day | ORAL | Status: DC

## 2014-10-06 NOTE — ED Provider Notes (Signed)
CSN: [id number]     Arrival date & time 10/06/14  0915 History   First MD Initiated Contact with Patient 10/06/14 1000     Chief Complaint  Patient presents with  . Abscess   (Consider location/radiation/quality/duration/timing/severity/associated sxs/prior Treatment) HPI  R sided groin/labial boil. Started 1 week ago. Getting bigger and painful. Some lateral tracking of the pain without visible rash. Pain is constant. Opened last night and bled w/ some improvement in pain. Does not shave in private area. Occasionally uses antibacterial soap. Similar symptoms at end of February and treated w/ Bactrim w/ resolution. Denies fevers, chest pain, palpitations, vaginal discharge, dysuria, frequency, abdominal pain, back pain, , rash   Past Medical History  Diagnosis Date  . HYPERLIPIDEMIA 04/30/2006  . ANXIETY 05/25/2006  . 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 INSOMNIA, PERSISTENT 02/22/2008  . DEPRESSION 01/02/2006  . OBSTRUCTIVE SLEEP APNEA 04/17/2010  . 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 ESOTROPIA, LEFT EYE 03/02/1948  . HYPERTENSION 05/25/2006  . BRONCHITIS, ACUTE 12/08/2007  . COPD 05/25/2006  . T M J 05/11/2009  . OSTEOARTHRITIS 05/25/2006  . LOW BACK PAIN 01/27/2007  . FIBROMYALGIA 04/02/2006  . Headache(784.0) 12/08/2007  . HYPERGLYCEMIA 05/15/2010  . Abdominal pain, epigastric 12/18/2009  . Lazy eye     left  . Polyuria   . Migraines   . Psychosomatic disease 2011   Past Surgical History  Procedure Laterality Date  . Cholecystectomy     Family History  Problem Relation Age of Onset  . Heart attack Father   . Hypertension Brother   . Heart disease Mother   . Colon cancer Neg Hx   . Stomach cancer Neg Hx    History  Substance Use Topics  . Smoking status: Former Smoker    Quit date: 07/10/2013  . Smokeless tobacco: Current User    Types: Snuff     Comment: smoked 1965-2014, up to 1 ppd  . Alcohol Use: No   OB History    No data available     Review of Systems Per  HPI with all other pertinent systems negative.   Allergies  Morphine and related; Bupropion hcl; Duloxetine; Ezetimibe; Metformin and related; Trazodone hcl; Gabapentin; and Tramadol hcl  Home Medications   Prior to Admission medications   Medication Sig Start Date End Date Taking? Authorizing Provider  ALPRAZolam Duanne Moron) 0.5 MG tablet TAKE 1 TABLET BY MOUTH TWICE DAILY 04/21/14   Lew Dawes V, MD  Cholecalciferol (VITAMIN D3) 2000 UNITS capsule Take 1 capsule (2,000 Units total) by mouth daily. 08/22/14   Aleksei Plotnikov V, MD  fluconazole (DIFLUCAN) 150 MG tablet Take 1 tablet (150 mg total) by mouth daily. Repeat dose in 3 days 10/06/14   Waldemar Dickens, MD  HYDROcodone-acetaminophen [facility]) 7.5-325 MG per tablet Take 1 tablet by mouth every 8 (eight) hours as needed for moderate pain or severe pain. 09/22/14   Aleksei Plotnikov V, MD  metoprolol succinate (TOPROL-XL) 25 MG 24 hr tablet Take 1 tablet (25 mg total) by mouth every morning. 09/30/13   Aleksei Plotnikov V, MD  ranitidine (ZANTAC) 150 MG tablet Take 1 tablet (150 mg total) by mouth 2 (two) times daily. 07/25/14   Noemi Chapel, MD  sulfamethoxazole-trimethoprim (BACTRIM DS,SEPTRA DS) 800-160 MG per tablet Take 1 tablet by mouth 2 (two) times daily. 10/06/14   Waldemar Dickens, MD   BP 142/69 mmHg  Pulse 64  Temp(Src) 98.5 F (36.9 C) (Oral)  Resp 16  SpO2 95% Physical Exam Physical Exam  Constitutional: oriented to person, place, and time. appears well-developed and well-nourished. No distress.  HENT:  Head: Normocephalic and atraumatic.  Eyes: EOMI. PERRL.  Neck: Normal range of motion.  Cardiovascular: RRR, no m/r/g, 2+ distal pulses,  Pulmonary/Chest: Effort normal and breath sounds normal. No respiratory distress.  Abdominal: Soft. Bowel sounds are normal. NonTTP, no distension.  Musculoskeletal: Normal range of motion. Non ttp, no effusion.  Neurological: alert and oriented to person, place, and time.  Skin:  Right groin area with 1 x 2 cm area of induration and erythema with central bleeding follicle. No tracking. No fluctuance. No purulent discharge.  Psychiatric: normal mood and affect. behavior is normal. Judgment and thought content normal.   ED Course  Procedures (including critical care time) Labs Review Labs Reviewed - No data to display  Imaging Review No results found.   MDM   1. Folliculitis    No sign of abscess Bactrim, Diflucan if developed C6 infection Discussed changing of undergarments, using antibacterial soap, use of Betadine washes in order to help prevent this from occurring again.  Precautions given and all questions answered  Linna Darner, MD Family Medicine 10/06/2014, 10:18 AM    Waldemar Dickens, MD 10/06/14 1018

## 2014-10-06 NOTE — Discharge Instructions (Signed)
You have developed another folliculitis infection. Please start the antibiotics. These use the Diflucan if you get a yeast infection. In the future continue to use warm compresses in these areas in order to promote them to come to a head and drained. Please use Betadine every day to every other day for the next several weeks in order to help prevent this from occurring again but this may occur with some regularity over the next several months.

## 2014-10-06 NOTE — ED Notes (Signed)
Patient c/o abscess on vagina x more than 1 week. Patient reports she has pain all the way from site of abscess to her hip. Patient reports last night her abscess began to drain on its on and opened. Patient is in NAD.

## 2014-10-09 ENCOUNTER — Telehealth: Payer: Self-pay | Admitting: *Deleted

## 2014-10-09 NOTE — Telephone Encounter (Signed)
Amanda Arnold - Client [facility] Patient Name: Amanda Arnold Gender: Female DOB: [birth year] Age: 69 Y 60 M 11 D Return Phone Number: [phone] (Primary) Address: [address] City/State/Zip: [place] [address] Client [facility] - Client Client Site [place] - [facility] Physician Plotnikov, Alex Contact Type Call Call Type Triage / Clinical Caller Name angela shelton Relationship To Patient Daughter Appointment Disposition EMR Appointment Attempted - Not Scheduled Info pasted into Epic Yes Return Phone Number [phone] (Primary) Chief Complaint Vaginal Pain Initial Comment Caller States mother has pain on her lower right side, has a knot on the outside of her vagina. hurts when she moves around and says it is really painful. PreDisposition Call Doctor Nurse Assessment Nurse: Donalynn Furlong, RN, Myna Hidalgo Date/Time Eilene Ghazi Time): 10/05/2014 11:05:16 AM Confirm and document reason for call. If symptomatic, describe symptoms. ---Caller States mother has pain on her lower front right side, has a knot on the outside of her vagina. hurts when she moves around and says it is really painful. Afebrile. Has the patient traveled out of the country within the last 30 days? ---No Does the patient require triage? ---Yes Related visit to physician within the last 2 weeks? ---Yes Does the PT have any chronic conditions? (i.e. diabetes, asthma, etc.) ---Yes List chronic conditions. ---diabetes, high blood pressure, sleep apnea Guidelines Guideline Title Affirmed Question Affirmed Notes Nurse Date/Time Eilene Ghazi Time) Abdominal Pain - Female [1] MILD-MODERATE pain AND [2] constant AND [3] present > 2 hours Donalynn Furlong, RN, Myna Hidalgo 10/05/2014 11:08:38 AM Disp. Time Eilene Ghazi Time) Disposition Final User 10/05/2014 11:26:56 AM See Physician within 4 Hours (or PCP triage) Yes Donalynn Furlong, RN, Myna Hidalgo PLEASE NOTE: All timestamps contained within this report are represented as [place] Standard Time. CONFIDENTIALTY NOTICE: This fax transmission is intended only for the addressee. It contains information that is legally privileged, confidential or otherwise protected from use or disclosure. If you are not the intended recipient, you are strictly prohibited from reviewing, disclosing, copying using or disseminating any of this information or taking any action in reliance on or regarding this information. If you have received this fax in error, please notify us immediately by telephone so that we can arrange for its return to [place]. Phone: [phone], Toll-Free: [phone], Fax: [phone] Page: 2 of 2 Call Id: [id number] Hope Understands: Yes Disagree/Comply: Comply Care Advice Given Per Guideline SEE PHYSICIAN WITHIN 4 HOURS (or PCP triage): * IF NO PCP TRIAGE: You need to be seen. Go to _______________ (ED/ [facility] or office if it will be open) within the next 3 or 4 hours. Go sooner if you become worse. * IF PCP TRIAGE REQUIRED: You may need to be seen. Your doctor will want to talk with you to decide what's best. I'll page the doctor now. If you haven't heard from the on-call doctor within 30 minutes, call again. (Note: If PCP can't be reached, send to [place] or office.) REST: Lie down and rest until seen. NOTHING BY MOUTH: Do not eat or drink anything for now. CALL BACK IF: * You become worse. CARE ADVICE given per Abdominal Pain, Female (Adult) guideline. After Care Instructions Given Call Event Type User Date / Time Description Comments User: Gennie Alma, RN Date/Time Eilene Ghazi Time): 10/05/2014 11:28:35 AM unable to obtain 4 hr outcome appt today. Daughter states she will take her to an urgent care

## 2014-10-23 ENCOUNTER — Other Ambulatory Visit: Payer: Self-pay | Admitting: Internal Medicine

## 2014-11-21 DIAGNOSIS — R399 Unspecified symptoms and signs involving the genitourinary system: Secondary | ICD-10-CM | POA: Diagnosis not present

## 2014-11-21 DIAGNOSIS — L292 Pruritus vulvae: Secondary | ICD-10-CM | POA: Diagnosis not present

## 2014-11-21 DIAGNOSIS — N898 Other specified noninflammatory disorders of vagina: Secondary | ICD-10-CM | POA: Diagnosis not present

## 2014-11-24 ENCOUNTER — Ambulatory Visit: Payer: Medicare Other | Admitting: Internal Medicine

## 2014-11-27 ENCOUNTER — Encounter (HOSPITAL_COMMUNITY): Payer: Self-pay

## 2014-11-27 ENCOUNTER — Emergency Department (HOSPITAL_COMMUNITY)
Admission: EM | Admit: 2014-11-27 | Discharge: 2014-11-28 | Disposition: A | Discharge status: Home / Self Care | Payer: Medicare Other | Attending: Emergency Medicine | Admitting: Emergency Medicine

## 2014-11-27 DIAGNOSIS — H6691 Otitis media, unspecified, right ear: Secondary | ICD-10-CM | POA: Diagnosis not present

## 2014-11-27 DIAGNOSIS — J449 Chronic obstructive pulmonary disease, unspecified: Secondary | ICD-10-CM | POA: Insufficient documentation

## 2014-11-27 DIAGNOSIS — F419 Anxiety disorder, unspecified: Secondary | ICD-10-CM | POA: Insufficient documentation

## 2014-11-27 DIAGNOSIS — Z792 Long term (current) use of antibiotics: Secondary | ICD-10-CM | POA: Diagnosis not present

## 2014-11-27 DIAGNOSIS — I10 Essential (primary) hypertension: Secondary | ICD-10-CM | POA: Diagnosis not present

## 2014-11-27 DIAGNOSIS — Z79899 Other long term (current) drug therapy: Secondary | ICD-10-CM | POA: Insufficient documentation

## 2014-11-27 DIAGNOSIS — G43909 Migraine, unspecified, not intractable, without status migrainosus: Secondary | ICD-10-CM | POA: Diagnosis not present

## 2014-11-27 DIAGNOSIS — Z87891 Personal history of nicotine dependence: Secondary | ICD-10-CM | POA: Insufficient documentation

## 2014-11-27 DIAGNOSIS — H9201 Otalgia, right ear: Secondary | ICD-10-CM | POA: Diagnosis not present

## 2014-11-27 DIAGNOSIS — M1990 Unspecified osteoarthritis, unspecified site: Secondary | ICD-10-CM | POA: Insufficient documentation

## 2014-11-27 DIAGNOSIS — E785 Hyperlipidemia, unspecified: Secondary | ICD-10-CM | POA: Diagnosis not present

## 2014-11-27 DIAGNOSIS — F329 Major depressive disorder, single episode, unspecified: Secondary | ICD-10-CM | POA: Diagnosis not present

## 2014-11-27 NOTE — ED Notes (Signed)
Pt presents with c/o pain in her head that just started today. Pt reports the pain is in the back of her head and is burning in nature. Pt denies any migraine hx.

## 2014-11-28 MED ORDER — ACYCLOVIR 800 MG PO TABS: 800.0000 mg | ORAL_TABLET | Freq: Every day | ORAL | Status: DC

## 2014-11-28 MED ORDER — LORAZEPAM 1 MG PO TABS: 1.0000 mg | ORAL_TABLET | Freq: Three times a day (TID) | ORAL | Status: DC | PRN

## 2014-11-28 MED ORDER — OXYCODONE-ACETAMINOPHEN 5-325 MG PO TABS
1.0000 | ORAL_TABLET | Freq: Once | ORAL | Status: AC
  Administered 2014-11-28: 1 via ORAL
  Filled 2014-11-28: qty 1

## 2014-11-28 NOTE — ED Provider Notes (Signed)
CSN: [id number]     Arrival date & time 11/27/14  2245 History   First MD Initiated Contact with Patient 11/28/14 0154     Chief Complaint  Patient presents with  . Pain in head      (Consider location/radiation/quality/duration/timing/severity/associated sxs/prior Treatment) HPI Comments: Burning type pain in the right scalp, affecting the external ear and extending into the right neck. No redness, swelling, rash. No hearing changes, drainage from the ear or fever. No headache, nausea or vomiting. Symptoms started today and have been progressive through the day. She took a Norco with minimal relief.  The history is provided by the patient. No language interpreter was used.    Past Medical History  Diagnosis Date  . HYPERLIPIDEMIA 04/30/2006  . ANXIETY 05/25/2006  . 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 INSOMNIA, PERSISTENT 02/22/2008  . DEPRESSION 01/02/2006  . OBSTRUCTIVE SLEEP APNEA 04/17/2010  . 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 ESOTROPIA, LEFT EYE 10-09-47  . HYPERTENSION 05/25/2006  . BRONCHITIS, ACUTE 12/08/2007  . COPD 05/25/2006  . T M J 05/11/2009  . OSTEOARTHRITIS 05/25/2006  . LOW BACK PAIN 01/27/2007  . FIBROMYALGIA 04/02/2006  . Headache(784.0) 12/08/2007  . HYPERGLYCEMIA 05/15/2010  . Abdominal pain, epigastric 12/18/2009  . Lazy eye     left  . Polyuria   . Migraines   . Psychosomatic disease 2011   Past Surgical History  Procedure Laterality Date  . Cholecystectomy     Family History  Problem Relation Age of Onset  . Heart attack Father   . Hypertension Brother   . Heart disease Mother   . Colon cancer Neg Hx   . Stomach cancer Neg Hx    History  Substance Use Topics  . Smoking status: Former Smoker    Quit date: 07/10/2013  . Smokeless tobacco: Current User    Types: Snuff     Comment: smoked 1965-2014, up to 1 ppd  . Alcohol Use: No   OB History    No data available     Review of Systems  Constitutional: Negative for fever.  HENT: Positive  for ear pain. Negative for congestion, ear discharge, hearing loss and sore throat.   Respiratory: Negative for shortness of breath.   Cardiovascular: Negative for chest pain.  Gastrointestinal: Negative for nausea and abdominal pain.  Musculoskeletal: Negative for neck stiffness.  Skin: Negative for color change and rash.  Neurological: Negative for weakness and headaches.      Allergies  Morphine and related; Bupropion hcl; Duloxetine; Ezetimibe; Metformin and related; Trazodone hcl; Gabapentin; and Tramadol hcl  Home Medications   Prior to Admission medications   Medication Sig Start Date End Date Taking? Authorizing Provider  ALPRAZolam Duanne Moron) 0.5 MG tablet TAKE 1 TABLET BY MOUTH TWICE DAILY 04/21/14   Lew Dawes V, MD  Cholecalciferol (VITAMIN D3) 2000 UNITS capsule Take 1 capsule (2,000 Units total) by mouth daily. 08/22/14   Aleksei Plotnikov V, MD  fluconazole (DIFLUCAN) 150 MG tablet Take 1 tablet (150 mg total) by mouth daily. Repeat dose in 3 days 10/06/14   Waldemar Dickens, MD  HYDROcodone-acetaminophen [facility]) 7.5-325 MG per tablet Take 1 tablet by mouth every 8 (eight) hours as needed for moderate pain or severe pain. 09/22/14   Aleksei Plotnikov V, MD  metoprolol succinate (TOPROL-XL) 25 MG 24 hr tablet TAKE 1 TABLET BY MOUTH EVERY MORNING 10/23/14   Aleksei Plotnikov V, MD  ranitidine (ZANTAC) 150 MG tablet Take 1 tablet (150 mg total) by mouth 2 (two) times  daily. 07/25/14   Noemi Chapel, MD  sulfamethoxazole-trimethoprim (BACTRIM DS,SEPTRA DS) 800-160 MG per tablet Take 1 tablet by mouth 2 (two) times daily. 10/06/14   Waldemar Dickens, MD   BP 136/66 mmHg  Pulse 64  Temp(Src) 97.8 F (36.6 C) (Oral)  Resp 18  Ht 5\' 2"  (1.575 m)  Wt 204 lb (92.534 kg)  BMI 37.30 kg/m2  SpO2 96% Physical Exam  Constitutional: She is oriented to person, place, and time. She appears well-developed and well-nourished.  HENT:  Head:    Right Ear: Tympanic membrane normal.  Right  ear is not swollen or red. Scalp has no abrasion, sore or rash. Tender to touch. No mastoid tenderness.   Eyes: Conjunctivae are normal.  Neck: Normal range of motion.  Pulmonary/Chest: Effort normal.  Neurological: She is alert and oriented to person, place, and time.  Skin: Skin is warm and dry. No rash noted. No erythema.    ED Course  Procedures (including critical care time) Labs Review Labs Reviewed - No data to display  Imaging Review No results found.   EKG Interpretation None      MDM   Final diagnoses:  None    1. Right ear pain  Exam is essentially negative. Pain follows cutaneous pattern without evidence suggesting infection - no lymphadenopathy, TM abnormalities, mastoid tenderness. Consider pre-shingles pain. Will provide pain relief and Rx for acyclovir in the event a rash develops. Encourage PCP recheck later this week.     Charlann Lange, PA-C 11/28/14 8616  Linton Flemings, MD 11/28/14 [phone]

## 2014-11-28 NOTE — Discharge Instructions (Signed)

## 2014-12-01 ENCOUNTER — Ambulatory Visit (INDEPENDENT_AMBULATORY_CARE_PROVIDER_SITE_OTHER): Payer: Medicare Other | Admitting: Internal Medicine

## 2014-12-01 ENCOUNTER — Other Ambulatory Visit (INDEPENDENT_AMBULATORY_CARE_PROVIDER_SITE_OTHER): Payer: Self-pay

## 2014-12-01 ENCOUNTER — Encounter: Payer: Self-pay | Admitting: Internal Medicine

## 2014-12-01 VITALS — BP 120/70 | HR 76 | Wt 205.0 lb

## 2014-12-01 DIAGNOSIS — E119 Type 2 diabetes mellitus without complications: Secondary | ICD-10-CM | POA: Diagnosis not present

## 2014-12-01 DIAGNOSIS — F411 Generalized anxiety disorder: Secondary | ICD-10-CM

## 2014-12-01 DIAGNOSIS — M545 Low back pain: Secondary | ICD-10-CM

## 2014-12-01 DIAGNOSIS — G500 Trigeminal neuralgia: Secondary | ICD-10-CM | POA: Diagnosis not present

## 2014-12-01 LAB — BASIC METABOLIC PANEL
BUN: 18 mg/dL (ref 6–23)
CO2: 26 mEq/L (ref 19–32)
Calcium: 9.7 mg/dL (ref 8.4–10.5)
Chloride: 104 mEq/L (ref 96–112)
Creatinine, Ser: 0.67 mg/dL (ref 0.40–1.20)
GFR: 93.42 mL/min (ref >60.00)
Glucose, Bld: 110 mg/dL — ABNORMAL HIGH (ref 70–99)
Potassium: 4.2 mEq/L (ref 3.5–5.1)
Sodium: 138 mEq/L (ref 135–145)

## 2014-12-01 LAB — HEMOGLOBIN A1C: Hgb A1c MFr Bld: 5.8 % (ref 4.6–6.5)

## 2014-12-01 MED ORDER — ALPRAZOLAM 0.5 MG PO TABS: ORAL_TABLET | ORAL | Status: DC

## 2014-12-01 MED ORDER — HYDROCODONE-ACETAMINOPHEN 7.5-325 MG PO TABS: 1.0000 | ORAL_TABLET | Freq: Three times a day (TID) | ORAL | Status: DC | PRN

## 2014-12-01 MED ORDER — ACYCLOVIR 800 MG PO TABS: 800.0000 mg | ORAL_TABLET | Freq: Every day | ORAL | Status: DC

## 2014-12-01 NOTE — Progress Notes (Signed)
Pre visit review using our clinic review tool, if applicable. No additional management support is needed unless otherwise documented below in the visit note. 

## 2014-12-01 NOTE — Progress Notes (Signed)
   Subjective:  C/o R head and ear pain - ?shingles per UC  F/u fatigue, aches and pains, LBP and HAs all the time - old complaints No CP F/u pain in R shoulder blade w/ROM  - better Overall doing OK Quit smoking 1/15 - not smoking  HPI  The patient is here to follow up on chronic HTN- better, depression, anxiety, headaches and chronic moderate fibromyalgia/LBP symptoms.   Wt Readings from Last 3 Encounters:  12/01/14 205 lb (92.987 kg)  11/27/14 204 lb (92.534 kg)  09/22/14 205 lb (92.987 kg)   BP Readings from Last 3 Encounters:  12/01/14 120/70  11/28/14 136/66  10/06/14 142/69      Review of Systems  Constitutional: Positive for unexpected weight change. Negative for chills, activity change and appetite change.  HENT: Negative for mouth sores.   Eyes: Negative for visual disturbance.  Respiratory: Negative for chest tightness.   Genitourinary: Negative for frequency, difficulty urinating and vaginal pain.  Musculoskeletal: Positive for arthralgias. Negative for gait problem.  Skin: Negative for pallor.  Neurological: Negative for tremors.  Psychiatric/Behavioral: Positive for sleep disturbance. Negative for suicidal ideas and confusion. The patient is nervous/anxious.         Objective:   Physical Exam  Constitutional: She appears well-developed. No distress.  HENT:  Head: Normocephalic.  Right Ear: External ear normal.  Left Ear: External ear normal.  Nose: Nose normal.  Mouth/Throat: Oropharynx is clear and moist.  Eyes: Conjunctivae are normal. Pupils are equal, round, and reactive to light. Right eye exhibits no discharge. Left eye exhibits no discharge.  Neck: Normal range of motion. Neck supple. No JVD present. No tracheal deviation present. No thyromegaly present.  Cardiovascular: Normal rate, regular rhythm and normal heart sounds.   Pulmonary/Chest: No stridor. No respiratory distress. She has no wheezes.  Abdominal: Soft. Bowel sounds are normal.  She exhibits no distension and no mass. There is no tenderness. There is no rebound and no guarding.  Musculoskeletal: She exhibits no edema or tenderness.  Lymphadenopathy:    She has no cervical adenopathy.  Neurological: She displays normal reflexes. No cranial nerve deficit. She exhibits normal muscle tone. Coordination normal.  Skin: No rash noted. No erythema.  Psychiatric: She has a normal mood and affect. Her behavior is normal. Judgment and thought content normal.  Acne, moles on face Intertrigo rash - resolved R scalp is tender R buttock is tender; str leg elev is (-) B; MS ok B LEs R knee flexors, extensors 5-/5 R knee DTR a little decreased Obese  Lab Results  Component Value Date   WBC 5.2 07/25/2014   HGB 13.1 07/25/2014   HCT 39.3 07/25/2014   PLT 179 07/25/2014   GLUCOSE 101* 07/25/2014   CHOL 183 04/21/2014   TRIG 155.0* 04/21/2014   HDL 44.00 04/21/2014   LDLCALC 108* 04/21/2014   ALT 34 04/21/2014   AST 32 04/21/2014   NA 140 07/25/2014   K 4.3 07/25/2014   CL 108 07/25/2014   CREATININE 0.66 07/25/2014   BUN 16 07/25/2014   CO2 25 07/25/2014   TSH 0.38 10/05/2013   HGBA1C 5.9 04/21/2014           Assessment & Plan:

## 2014-12-01 NOTE — Assessment & Plan Note (Signed)
Intol of metformin Risks associated with diet noncompliance were discussed. Compliance was encouraged. Labs

## 2014-12-01 NOTE — Assessment & Plan Note (Signed)
Chronic On PRN Xanax  Potential benefits of a long term benzodiazepines  use as well as potential risks  and complications were explained to the patient and were aknowledged.

## 2014-12-01 NOTE — Assessment & Plan Note (Signed)
Norco prn  Potential benefits of a long term opioids use as well as potential risks (i.e. addiction risk, apnea etc) and complications (i.e. Somnolence, constipation and others) were explained to the patient and were aknowledged. 

## 2014-12-01 NOTE — Assessment & Plan Note (Signed)
R ear, R scalp 5/16 - probable Zoster Take Acyclovir Rx

## 2014-12-29 ENCOUNTER — Encounter: Payer: Self-pay | Admitting: Family

## 2014-12-29 ENCOUNTER — Ambulatory Visit (INDEPENDENT_AMBULATORY_CARE_PROVIDER_SITE_OTHER): Payer: Medicare Other | Admitting: Family

## 2014-12-29 VITALS — BP 98/60 | HR 81 | Temp 98.4°F | Wt 200.6 lb

## 2014-12-29 DIAGNOSIS — R059 Cough, unspecified: Secondary | ICD-10-CM

## 2014-12-29 DIAGNOSIS — R05 Cough: Secondary | ICD-10-CM

## 2014-12-29 DIAGNOSIS — J209 Acute bronchitis, unspecified: Secondary | ICD-10-CM

## 2014-12-29 MED ORDER — PREDNISONE 20 MG PO TABS: ORAL_TABLET | ORAL | Status: AC

## 2014-12-29 NOTE — Progress Notes (Signed)
Subjective:    Patient ID: Amanda Arnold, female    DOB: Nov 11, 1945, 69 y.o.   MRN: [id number]  HPI Comments: 69 year old patient in office today with complaints of headaches, cough, diarrhea, body aches, and weakness.  Patient states that symptoms began four days ago, 12/26/14.  Patient states that she has a decreased appetite since symptoms began.  States that she has been taking an OTC cough suppressant, but hasn't had any relief.  Patient denies chest pain or shortness of breath.       Review of Systems  Constitutional: Negative.   Eyes: Negative.   Respiratory: Positive for cough.   Cardiovascular: Negative.   Gastrointestinal: Positive for diarrhea.  Endocrine: Negative.   Genitourinary: Negative.   Musculoskeletal: Positive for back pain.  Skin: Negative.   Neurological: Positive for headaches.  Hematological: Negative.   Psychiatric/Behavioral: Negative.    Past Medical History  Diagnosis Date  . HYPERLIPIDEMIA 04/30/2006  . ANXIETY 05/25/2006  . 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 INSOMNIA, PERSISTENT 02/22/2008  . DEPRESSION 01/02/2006  . OBSTRUCTIVE SLEEP APNEA 04/17/2010  . 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 ESOTROPIA, LEFT EYE 1946/07/02  . HYPERTENSION 05/25/2006  . BRONCHITIS, ACUTE 12/08/2007  . COPD 05/25/2006  . T M J 05/11/2009  . OSTEOARTHRITIS 05/25/2006  . LOW BACK PAIN 01/27/2007  . FIBROMYALGIA 04/02/2006  . Headache(784.0) 12/08/2007  . HYPERGLYCEMIA 05/15/2010  . Abdominal pain, epigastric 12/18/2009  . Lazy eye     left  . Polyuria   . Migraines   . Psychosomatic disease 2011    History   Social History  . Marital Status: Married    Spouse Name: N/A  . Number of Children: 5  . Years of Education: N/A   Occupational History  . house wife    Social History Main Topics  . Smoking status: Former Smoker    Quit date: 07/10/2013  . Smokeless tobacco: Current User    Types: Snuff     Comment: smoked 1965-2014, up to 1 ppd  . Alcohol Use:  No  . Drug Use: No  . Sexual Activity: Not Currently   Other Topics Concern  . Not on file   Social History Narrative    Past Surgical History  Procedure Laterality Date  . Cholecystectomy      Family History  Problem Relation Age of Onset  . Heart attack Father   . Hypertension Brother   . Heart disease Mother   . Colon cancer Neg Hx   . Stomach cancer Neg Hx     Allergies  Allergen Reactions  . Morphine And Related Shortness Of Breath  . Bupropion Hcl Other (See Comments)    "does not feel right"  . Duloxetine Other (See Comments)    REACTION: did not feel good  . Ezetimibe Other (See Comments)    REACTION: legs burning  . Metformin And Related Diarrhea    Diarrhea w/XR or regular   . Trazodone Hcl Swelling  . Gabapentin Diarrhea and Other (See Comments)    Diarrhea, HA  . Tramadol Hcl Palpitations    REACTION: palpitations    Current Outpatient Prescriptions on File Prior to Visit  Medication Sig Dispense Refill  . ALPRAZolam (XANAX) 0.5 MG tablet TAKE 1 TABLET BY MOUTH TWICE DAILY 60 tablet 2  . HYDROcodone-acetaminophen (NORCO) 7.5-325 MG per tablet Take 1 tablet by mouth every 8 (eight) hours as needed for moderate pain or severe pain. 90 tablet 0  . metoprolol succinate (TOPROL-XL)  25 MG 24 hr tablet TAKE 1 TABLET BY MOUTH EVERY MORNING 90 tablet 3  . acyclovir (ZOVIRAX) 800 MG tablet Take 1 tablet (800 mg total) by mouth 5 (five) times daily. (Patient not taking: Reported on 12/29/2014) 35 tablet 0  . Cholecalciferol (VITAMIN D3) 2000 UNITS capsule Take 1 capsule (2,000 Units total) by mouth daily. 100 capsule 3  . fluconazole (DIFLUCAN) 150 MG tablet Take 1 tablet (150 mg total) by mouth daily. Repeat dose in 3 days (Patient not taking: Reported on 12/29/2014) 2 tablet 0  . nystatin-triamcinolone (MYCOLOG II) cream     . ranitidine (ZANTAC) 150 MG tablet Take 1 tablet (150 mg total) by mouth 2 (two) times daily. (Patient not taking: Reported on 12/29/2014) 60  tablet 0   No current facility-administered medications on file prior to visit.    BP 98/60 mmHg  Pulse 81  Temp(Src) 98.4 F (36.9 C) (Oral)  Wt 200 lb 9.6 oz (90.992 kg)chart    Objective:   Physical Exam  Constitutional: She is oriented to person, place, and time. She appears well-developed and well-nourished.  HENT:  Head: Normocephalic and atraumatic.  Neck: Normal range of motion. Neck supple.  Cardiovascular: Normal rate, regular rhythm and normal heart sounds.   Pulmonary/Chest: Effort normal and breath sounds normal.  Abdominal: Soft. Bowel sounds are normal.  Musculoskeletal: Normal range of motion. She exhibits tenderness.  Neurological: She is alert and oriented to person, place, and time.  Skin: Skin is warm and dry.  Psychiatric: She has a normal mood and affect. Her behavior is normal. Judgment and thought content normal.          Assessment & Plan:  Monique was seen today for diarrhea, sore throat, headache and cough.  Diagnoses and all orders for this visit:  Acute bronchitis, unspecified organism  Cough  Other orders -     predniSONE (DELTASONE) 20 MG tablet; 60mg  PO qam x 3 days, 40mg  po qam x 3 days, 20mg  qam x 3 days   Prednisone taper: 60 mg X 3 days, then 40 mg X 3 days, then 20 mg X 3 days.  Patient to call office if symptoms do not improve or worsen.

## 2014-12-29 NOTE — Patient Instructions (Signed)

## 2014-12-29 NOTE — Progress Notes (Signed)
Pre visit review using our clinic review tool, if applicable. No additional management support is needed unless otherwise documented below in the visit note. 

## 2015-01-08 ENCOUNTER — Other Ambulatory Visit: Payer: Self-pay

## 2015-02-09 ENCOUNTER — Encounter: Payer: Self-pay | Admitting: Internal Medicine

## 2015-02-09 ENCOUNTER — Ambulatory Visit (INDEPENDENT_AMBULATORY_CARE_PROVIDER_SITE_OTHER): Payer: Medicare Other | Admitting: Internal Medicine

## 2015-02-09 ENCOUNTER — Other Ambulatory Visit (INDEPENDENT_AMBULATORY_CARE_PROVIDER_SITE_OTHER): Payer: Medicare Other

## 2015-02-09 VITALS — BP 138/78 | HR 66 | Wt 206.0 lb

## 2015-02-09 DIAGNOSIS — R1031 Right lower quadrant pain: Secondary | ICD-10-CM

## 2015-02-09 DIAGNOSIS — I10 Essential (primary) hypertension: Secondary | ICD-10-CM | POA: Diagnosis not present

## 2015-02-09 DIAGNOSIS — E119 Type 2 diabetes mellitus without complications: Secondary | ICD-10-CM

## 2015-02-09 LAB — URINALYSIS, ROUTINE W REFLEX MICROSCOPIC
Bilirubin Urine: NEGATIVE
Hgb urine dipstick: NEGATIVE
Ketones, ur: NEGATIVE
Nitrite: NEGATIVE
Specific Gravity, Urine: >=1.03 — AB (ref 1.000–1.030)
Total Protein, Urine: NEGATIVE
Urine Glucose: NEGATIVE
Urobilinogen, UA: 0.2 (ref 0.0–1.0)
pH: 5.5 (ref 5.0–8.0)

## 2015-02-09 LAB — HEPATIC FUNCTION PANEL
ALT: 29 U/L (ref 0–35)
AST: 28 U/L (ref 0–37)
Albumin: 4 g/dL (ref 3.5–5.2)
Alkaline Phosphatase: 83 U/L (ref 39–117)
Bilirubin, Direct: 0.1 mg/dL (ref 0.0–0.3)
Total Bilirubin: 0.4 mg/dL (ref 0.2–1.2)
Total Protein: 6.5 g/dL (ref 6.0–8.3)

## 2015-02-09 LAB — CBC WITH DIFFERENTIAL/PLATELET
Basophils Absolute: 0 10*3/uL (ref 0.0–0.1)
Basophils Relative: 0.5 % (ref 0.0–3.0)
Eosinophils Absolute: 0.1 10*3/uL (ref 0.0–0.7)
Eosinophils Relative: 1.5 % (ref 0.0–5.0)
HCT: 39 % (ref 36.0–46.0)
Hemoglobin: 13.2 g/dL (ref 12.0–15.0)
Lymphocytes Relative: 40.1 % (ref 12.0–46.0)
Lymphs Abs: 2.6 10*3/uL (ref 0.7–4.0)
MCHC: 33.9 g/dL (ref 30.0–36.0)
MCV: 92 fl (ref 78.0–100.0)
Monocytes Absolute: 0.5 10*3/uL (ref 0.1–1.0)
Monocytes Relative: 7.1 % (ref 3.0–12.0)
Neutro Abs: 3.3 10*3/uL (ref 1.4–7.7)
Neutrophils Relative %: 50.8 % (ref 43.0–77.0)
Platelets: 200 10*3/uL (ref 150.0–400.0)
RBC: 4.24 Mil/uL (ref 3.87–5.11)
RDW: 13.3 % (ref 11.5–15.5)
WBC: 6.4 10*3/uL (ref 4.0–10.5)

## 2015-02-09 LAB — BASIC METABOLIC PANEL
BUN: 16 mg/dL (ref 6–23)
CO2: 27 mEq/L (ref 19–32)
Calcium: 9 mg/dL (ref 8.4–10.5)
Chloride: 108 mEq/L (ref 96–112)
Creatinine, Ser: 0.7 mg/dL (ref 0.40–1.20)
GFR: 88.77 mL/min (ref >60.00)
Glucose, Bld: 144 mg/dL — ABNORMAL HIGH (ref 70–99)
Potassium: 4.2 mEq/L (ref 3.5–5.1)
Sodium: 141 mEq/L (ref 135–145)

## 2015-02-09 LAB — SEDIMENTATION RATE: Sed Rate: 10 mm/hr (ref 0–22)

## 2015-02-09 LAB — HEMOGLOBIN A1C: Hgb A1c MFr Bld: 6.1 % (ref 4.6–6.5)

## 2015-02-09 MED ORDER — ALPRAZOLAM 0.5 MG PO TABS: ORAL_TABLET | ORAL | Status: DC

## 2015-02-09 MED ORDER — HYDROCODONE-ACETAMINOPHEN 7.5-325 MG PO TABS: 1.0000 | ORAL_TABLET | Freq: Three times a day (TID) | ORAL | Status: DC | PRN

## 2015-02-09 NOTE — Progress Notes (Signed)
Pre visit review using our clinic review tool, if applicable. No additional management support is needed unless otherwise documented below in the visit note. 

## 2015-02-09 NOTE — Assessment & Plan Note (Addendum)
2/16, 7/16 ?etiology 3/16 abd [place] -- WNL   RLQ is NT. No mass or rebound pain. No hernia. She is more tender in R groin over fat apron part Discussed options. Labs. CT if worse

## 2015-02-09 NOTE — Progress Notes (Signed)
Subjective:  Patient ID: Amanda Arnold, female    DOB: May 24, 1946  Age: 69 y.o. MRN: [id number]  CC: No chief complaint on file.   HPI DIAMANTINA EDINGER presents for OA, dyslipidemia, LBP, anxiety f/u. C/o RLQ/R flank pain x 1 week. Pt had one yellow stool. No n/v  Outpatient Prescriptions Prior to Visit  Medication Sig Dispense Refill  . ALPRAZolam (XANAX) 0.5 MG tablet TAKE 1 TABLET BY MOUTH TWICE DAILY 60 tablet 2  . Cholecalciferol (VITAMIN D3) 2000 UNITS capsule Take 1 capsule (2,000 Units total) by mouth daily. 100 capsule 3  . fluconazole (DIFLUCAN) 150 MG tablet Take 1 tablet (150 mg total) by mouth daily. Repeat dose in 3 days 2 tablet 0  . HYDROcodone-acetaminophen (NORCO) 7.5-325 MG per tablet Take 1 tablet by mouth every 8 (eight) hours as needed for moderate pain or severe pain. 90 tablet 0  . metoprolol succinate (TOPROL-XL) 25 MG 24 hr tablet TAKE 1 TABLET BY MOUTH EVERY MORNING 90 tablet 3  . acyclovir (ZOVIRAX) 800 MG tablet Take 1 tablet (800 mg total) by mouth 5 (five) times daily. (Patient not taking: Reported on 02/09/2015) 35 tablet 0  . nystatin-triamcinolone (MYCOLOG II) cream     . ranitidine (ZANTAC) 150 MG tablet Take 1 tablet (150 mg total) by mouth 2 (two) times daily. (Patient not taking: Reported on 02/09/2015) 60 tablet 0   No facility-administered medications prior to visit.    ROS Review of Systems  Constitutional: Positive for unexpected weight change. Negative for chills, activity change, appetite change and fatigue.  HENT: Negative for congestion, mouth sores, sinus pressure and voice change.   Eyes: Negative for visual disturbance.  Respiratory: Negative for cough and chest tightness.   Gastrointestinal: Positive for abdominal pain and diarrhea. Negative for nausea, vomiting, constipation and anal bleeding.  Genitourinary: Negative for frequency, difficulty urinating and vaginal pain.  Musculoskeletal: Positive for back pain and arthralgias.  Negative for gait problem.  Skin: Negative for pallor and rash.  Neurological: Negative for dizziness, tremors, weakness, numbness and headaches.  Psychiatric/Behavioral: Negative for suicidal ideas, confusion and sleep disturbance. The patient is nervous/anxious.     Objective:  BP 138/78 mmHg  Pulse 66  Wt 206 lb (93.441 kg)  SpO2 95%  BP Readings from Last 3 Encounters:  02/09/15 138/78  12/29/14 98/60  12/01/14 120/70    Wt Readings from Last 3 Encounters:  02/09/15 206 lb (93.441 kg)  12/29/14 200 lb 9.6 oz (90.992 kg)  12/01/14 205 lb (92.987 kg)    Physical Exam  Constitutional: She appears well-developed. No distress.  HENT:  Head: Normocephalic.  Right Ear: External ear normal.  Left Ear: External ear normal.  Nose: Nose normal.  Mouth/Throat: Oropharynx is clear and moist.  Eyes: Conjunctivae are normal. Pupils are equal, round, and reactive to light. Right eye exhibits no discharge. Left eye exhibits no discharge.  Neck: Normal range of motion. Neck supple. No JVD present. No tracheal deviation present. No thyromegaly present.  Cardiovascular: Normal rate, regular rhythm and normal heart sounds.   Pulmonary/Chest: No stridor. No respiratory distress. She has no wheezes.  Abdominal: Soft. Bowel sounds are normal. She exhibits no distension and no mass. There is tenderness. There is no rebound and no guarding.  Musculoskeletal: She exhibits no edema or tenderness.  Lymphadenopathy:    She has no cervical adenopathy.  Neurological: She displays normal reflexes. No cranial nerve deficit. She exhibits normal muscle tone. Coordination normal.  Skin: No rash  noted. No erythema.  Psychiatric: She has a normal mood and affect. Her behavior is normal. Judgment and thought content normal.  RLQ is NT. No mass or rebound pain. No hernia. She is more tender in R groin over fat apron part  Lab Results  Component Value Date   WBC 5.2 07/25/2014   HGB 13.1 07/25/2014   HCT  39.3 07/25/2014   PLT 179 07/25/2014   GLUCOSE 110* 12/01/2014   CHOL 183 04/21/2014   TRIG 155.0* 04/21/2014   HDL 44.00 04/21/2014   LDLCALC 108* 04/21/2014   ALT 34 04/21/2014   AST 32 04/21/2014   NA 138 12/01/2014   K 4.2 12/01/2014   CL 104 12/01/2014   CREATININE 0.67 12/01/2014   BUN 18 12/01/2014   CO2 26 12/01/2014   TSH 0.38 10/05/2013   HGBA1C 5.8 12/01/2014    No results found.  Assessment & Plan:   There are no diagnoses linked to this encounter. I am having Ms. Huck maintain her ranitidine, Vitamin D3, fluconazole, metoprolol succinate, nystatin-triamcinolone, ALPRAZolam, HYDROcodone-acetaminophen, and acyclovir.  No orders of the defined types were placed in this encounter.     Follow-up: No Follow-up on file.  Walker Kehr, MD

## 2015-02-09 NOTE — Assessment & Plan Note (Signed)
On diet Risks associated with diet noncompliance were discussed. Compliance was encouraged. 

## 2015-02-09 NOTE — Assessment & Plan Note (Signed)
On Toprol XL

## 2015-02-21 ENCOUNTER — Ambulatory Visit: Payer: Self-pay | Admitting: Internal Medicine

## 2015-03-02 ENCOUNTER — Ambulatory Visit: Payer: Self-pay | Admitting: Internal Medicine

## 2015-05-09 ENCOUNTER — Other Ambulatory Visit: Payer: Self-pay

## 2015-05-09 DIAGNOSIS — Z1231 Encounter for screening mammogram for malignant neoplasm of breast: Secondary | ICD-10-CM

## 2015-05-15 ENCOUNTER — Ambulatory Visit (INDEPENDENT_AMBULATORY_CARE_PROVIDER_SITE_OTHER): Payer: Medicare Other | Admitting: Internal Medicine

## 2015-05-15 ENCOUNTER — Other Ambulatory Visit (INDEPENDENT_AMBULATORY_CARE_PROVIDER_SITE_OTHER): Payer: Medicare Other

## 2015-05-15 ENCOUNTER — Encounter: Payer: Self-pay | Admitting: Internal Medicine

## 2015-05-15 VITALS — BP 138/82 | HR 68 | Wt 208.0 lb

## 2015-05-15 DIAGNOSIS — I10 Essential (primary) hypertension: Secondary | ICD-10-CM

## 2015-05-15 DIAGNOSIS — G8929 Other chronic pain: Secondary | ICD-10-CM

## 2015-05-15 DIAGNOSIS — R202 Paresthesia of skin: Secondary | ICD-10-CM

## 2015-05-15 DIAGNOSIS — R1031 Right lower quadrant pain: Secondary | ICD-10-CM

## 2015-05-15 DIAGNOSIS — Z79899 Other long term (current) drug therapy: Secondary | ICD-10-CM

## 2015-05-15 DIAGNOSIS — M5440 Lumbago with sciatica, unspecified side: Secondary | ICD-10-CM | POA: Diagnosis not present

## 2015-05-15 DIAGNOSIS — E119 Type 2 diabetes mellitus without complications: Secondary | ICD-10-CM

## 2015-05-15 LAB — HEPATIC FUNCTION PANEL
ALT: 31 U/L (ref 0–35)
AST: 33 U/L (ref 0–37)
Albumin: 4 g/dL (ref 3.5–5.2)
Alkaline Phosphatase: 86 U/L (ref 39–117)
Bilirubin, Direct: 0 mg/dL (ref 0.0–0.3)
Total Bilirubin: 0.5 mg/dL (ref 0.2–1.2)
Total Protein: 6.9 g/dL (ref 6.0–8.3)

## 2015-05-15 LAB — BASIC METABOLIC PANEL
BUN: 16 mg/dL (ref 6–23)
CO2: 28 mEq/L (ref 19–32)
Calcium: 9.3 mg/dL (ref 8.4–10.5)
Chloride: 107 mEq/L (ref 96–112)
Creatinine, Ser: 0.71 mg/dL (ref 0.40–1.20)
GFR: 87.26 mL/min (ref >60.00)
Glucose, Bld: 133 mg/dL — ABNORMAL HIGH (ref 70–99)
Potassium: 3.9 mEq/L (ref 3.5–5.1)
Sodium: 142 mEq/L (ref 135–145)

## 2015-05-15 LAB — URINALYSIS, ROUTINE W REFLEX MICROSCOPIC
Bilirubin Urine: NEGATIVE
Ketones, ur: NEGATIVE
Nitrite: NEGATIVE
Specific Gravity, Urine: >=1.03 — AB (ref 1.000–1.030)
Total Protein, Urine: NEGATIVE
Urine Glucose: NEGATIVE
Urobilinogen, UA: 0.2 (ref 0.0–1.0)
pH: 5.5 (ref 5.0–8.0)

## 2015-05-15 LAB — TSH: TSH: 1.07 u[IU]/mL (ref 0.35–4.50)

## 2015-05-15 LAB — HEMOGLOBIN A1C: Hgb A1c MFr Bld: 6 % (ref 4.6–6.5)

## 2015-05-15 LAB — SEDIMENTATION RATE: Sed Rate: 10 mm/hr (ref 0–22)

## 2015-05-15 LAB — VITAMIN B12: Vitamin B-12: 317 pg/mL (ref 211–911)

## 2015-05-15 MED ORDER — HYDROCODONE-ACETAMINOPHEN 7.5-325 MG PO TABS: 1.0000 | ORAL_TABLET | Freq: Three times a day (TID) | ORAL | Status: DC | PRN

## 2015-05-15 MED ORDER — CIPROFLOXACIN HCL 250 MG PO TABS: 250.0000 mg | ORAL_TABLET | Freq: Two times a day (BID) | ORAL | Status: DC

## 2015-05-15 NOTE — Assessment & Plan Note (Signed)
  On diet  

## 2015-05-15 NOTE — Assessment & Plan Note (Signed)
On Toprol XL

## 2015-05-15 NOTE — Assessment & Plan Note (Signed)
Norco prn  Potential benefits of a long term opioids use as well as potential risks (i.e. addiction risk, apnea etc) and complications (i.e. Somnolence, constipation and others) were explained to the patient and were aknowledged. 

## 2015-05-15 NOTE — Assessment & Plan Note (Signed)
10/16 B LE chronic

## 2015-05-15 NOTE — Progress Notes (Signed)
Pre visit review using our clinic review tool, if applicable. No additional management support is needed unless otherwise documented below in the visit note. 

## 2015-05-15 NOTE — Progress Notes (Signed)
Subjective:  Patient ID: Amanda Arnold, female    DOB: Apr 02, 1946  Age: 69 y.o. MRN: [id number]  CC: No chief complaint on file.   HPI Amanda Arnold presents for LBP, HTN, DM2 f/u. C/o burning in feet  Outpatient Prescriptions Prior to Visit  Medication Sig Dispense Refill  . acyclovir (ZOVIRAX) 800 MG tablet Take 1 tablet (800 mg total) by mouth 5 (five) times daily. 35 tablet 0  . ALPRAZolam (XANAX) 0.5 MG tablet TAKE 1 TABLET BY MOUTH TWICE DAILY 60 tablet 2  . Cholecalciferol (VITAMIN D3) 2000 UNITS capsule Take 1 capsule (2,000 Units total) by mouth daily. 100 capsule 3  . fluconazole (DIFLUCAN) 150 MG tablet Take 1 tablet (150 mg total) by mouth daily. Repeat dose in 3 days 2 tablet 0  . HYDROcodone-acetaminophen (NORCO) 7.5-325 MG per tablet Take 1 tablet by mouth every 8 (eight) hours as needed for moderate pain or severe pain. 90 tablet 0  . metoprolol succinate (TOPROL-XL) 25 MG 24 hr tablet TAKE 1 TABLET BY MOUTH EVERY MORNING 90 tablet 3  . nystatin-triamcinolone (MYCOLOG II) cream     . ranitidine (ZANTAC) 150 MG tablet Take 1 tablet (150 mg total) by mouth 2 (two) times daily. 60 tablet 0   No facility-administered medications prior to visit.    ROS Review of Systems  Constitutional: Positive for fatigue. Negative for chills, activity change, appetite change and unexpected weight change.  HENT: Negative for congestion, mouth sores and sinus pressure.   Eyes: Negative for visual disturbance.  Respiratory: Negative for cough and chest tightness.   Gastrointestinal: Negative for nausea and abdominal pain.  Genitourinary: Negative for frequency, difficulty urinating and vaginal pain.  Musculoskeletal: Positive for back pain. Negative for gait problem.  Skin: Negative for pallor and rash.  Neurological: Negative for dizziness, tremors, weakness, numbness and headaches.  Psychiatric/Behavioral: Positive for sleep disturbance and dysphoric mood. Negative for suicidal  ideas and confusion. The patient is nervous/anxious.     Objective:  BP 138/82 mmHg  Pulse 68  Wt 208 lb (94.348 kg)  SpO2 95%  BP Readings from Last 3 Encounters:  05/15/15 138/82  02/09/15 138/78  12/29/14 98/60    Wt Readings from Last 3 Encounters:  05/15/15 208 lb (94.348 kg)  02/09/15 206 lb (93.441 kg)  12/29/14 200 lb 9.6 oz (90.992 kg)    Physical Exam  Constitutional: She appears well-developed. No distress.  HENT:  Head: Normocephalic.  Right Ear: External ear normal.  Left Ear: External ear normal.  Nose: Nose normal.  Mouth/Throat: Oropharynx is clear and moist.  Eyes: Conjunctivae are normal. Pupils are equal, round, and reactive to light. Right eye exhibits no discharge. Left eye exhibits no discharge.  Neck: Normal range of motion. Neck supple. No JVD present. No tracheal deviation present. No thyromegaly present.  Cardiovascular: Normal rate, regular rhythm and normal heart sounds.   Pulmonary/Chest: No stridor. No respiratory distress. She has no wheezes.  Abdominal: Soft. Bowel sounds are normal. She exhibits no distension and no mass. There is no tenderness. There is no rebound and no guarding.  Musculoskeletal: She exhibits tenderness. She exhibits no edema.  Lymphadenopathy:    She has no cervical adenopathy.  Neurological: She displays normal reflexes. No cranial nerve deficit. She exhibits normal muscle tone. Coordination normal.  Skin: No rash noted. No erythema.  Psychiatric: She has a normal mood and affect. Her behavior is normal. Judgment and thought content normal.  Obese LS is tender  Lab  Results  Component Value Date   WBC 6.4 02/09/2015   HGB 13.2 02/09/2015   HCT 39.0 02/09/2015   PLT 200.0 02/09/2015   GLUCOSE 144* 02/09/2015   CHOL 183 04/21/2014   TRIG 155.0* 04/21/2014   HDL 44.00 04/21/2014   LDLCALC 108* 04/21/2014   ALT 29 02/09/2015   AST 28 02/09/2015   NA 141 02/09/2015   K 4.2 02/09/2015   CL 108 02/09/2015    CREATININE 0.70 02/09/2015   BUN 16 02/09/2015   CO2 27 02/09/2015   TSH 0.38 10/05/2013   HGBA1C 6.1 02/09/2015    No results found.  Assessment & Plan:   Diagnoses and all orders for this visit:  Right lower quadrant pain -     HYDROcodone-acetaminophen (NORCO) 7.5-325 MG tablet; Take 1 tablet by mouth every 8 (eight) hours as needed for moderate pain or severe pain.  Essential hypertension  Controlled type 2 diabetes mellitus without complication, without long-term current use of insulin (HCC)  Chronic midline low back pain with sciatica, sciatica laterality unspecified   I am having [name] maintain her ranitidine, Vitamin D3, fluconazole, metoprolol succinate, nystatin-triamcinolone, acyclovir, ALPRAZolam, and HYDROcodone-acetaminophen.  No orders of the defined types were placed in this encounter.     Follow-up: No Follow-up on file.  Walker Kehr, MD

## 2015-05-21 ENCOUNTER — Encounter (HOSPITAL_COMMUNITY): Payer: Self-pay | Admitting: *Deleted

## 2015-05-21 ENCOUNTER — Emergency Department (HOSPITAL_COMMUNITY)
Admission: EM | Admit: 2015-05-21 | Discharge: 2015-05-21 | Disposition: A | Discharge status: Home / Self Care | Payer: Medicare Other | Attending: Emergency Medicine | Admitting: Emergency Medicine

## 2015-05-21 DIAGNOSIS — Z87891 Personal history of nicotine dependence: Secondary | ICD-10-CM | POA: Insufficient documentation

## 2015-05-21 DIAGNOSIS — Z8639 Personal history of other endocrine, nutritional and metabolic disease: Secondary | ICD-10-CM | POA: Diagnosis not present

## 2015-05-21 DIAGNOSIS — M5431 Sciatica, right side: Secondary | ICD-10-CM | POA: Diagnosis not present

## 2015-05-21 DIAGNOSIS — M25551 Pain in right hip: Secondary | ICD-10-CM | POA: Diagnosis not present

## 2015-05-21 DIAGNOSIS — J449 Chronic obstructive pulmonary disease, unspecified: Secondary | ICD-10-CM | POA: Insufficient documentation

## 2015-05-21 DIAGNOSIS — M1990 Unspecified osteoarthritis, unspecified site: Secondary | ICD-10-CM | POA: Insufficient documentation

## 2015-05-21 DIAGNOSIS — F329 Major depressive disorder, single episode, unspecified: Secondary | ICD-10-CM | POA: Diagnosis not present

## 2015-05-21 DIAGNOSIS — R1031 Right lower quadrant pain: Secondary | ICD-10-CM | POA: Insufficient documentation

## 2015-05-21 DIAGNOSIS — M79651 Pain in right thigh: Secondary | ICD-10-CM | POA: Diagnosis present

## 2015-05-21 DIAGNOSIS — I10 Essential (primary) hypertension: Secondary | ICD-10-CM | POA: Insufficient documentation

## 2015-05-21 DIAGNOSIS — G629 Polyneuropathy, unspecified: Secondary | ICD-10-CM | POA: Diagnosis not present

## 2015-05-21 DIAGNOSIS — F419 Anxiety disorder, unspecified: Secondary | ICD-10-CM | POA: Insufficient documentation

## 2015-05-21 LAB — URINALYSIS, ROUTINE W REFLEX MICROSCOPIC
Bilirubin Urine: NEGATIVE
Glucose, UA: NEGATIVE mg/dL
Hgb urine dipstick: NEGATIVE
Ketones, ur: NEGATIVE mg/dL
Nitrite: NEGATIVE
Protein, ur: NEGATIVE mg/dL
Specific Gravity, Urine: 1.026 (ref 1.005–1.030)
Urobilinogen, UA: 1 mg/dL (ref 0.0–1.0)
pH: 5.5 (ref 5.0–8.0)

## 2015-05-21 LAB — URINE MICROSCOPIC-ADD ON

## 2015-05-21 MED ORDER — OXYCODONE-ACETAMINOPHEN 5-325 MG PO TABS
1.0000 | ORAL_TABLET | Freq: Once | ORAL | Status: AC
  Administered 2015-05-21: 1 via ORAL
  Filled 2015-05-21: qty 1

## 2015-05-21 MED ORDER — OXYCODONE-ACETAMINOPHEN 5-325 MG PO TABS: 2.0000 | ORAL_TABLET | ORAL | Status: DC | PRN

## 2015-05-21 NOTE — ED Notes (Signed)
Pt is here with right lower back pain that radiates down left right leg for the last 3 weeks.  Pt is stating urine dark color and strong odor. Pulse present.

## 2015-05-21 NOTE — ED Provider Notes (Signed)
Arrival Date & Time: 05/21/15 & 1316 History   Chief Complaint  Patient presents with  . Back Pain  . Leg Pain   HPI Amanda Arnold is a 69 y.o. female who presents for assessment of a posterior leg pain on right that originates from the sacroiliac joint described as a sharp intermittent stabbing sensation that radiates down the posterior lateral portion of her leg. Patient states that she has also had the symptoms for approximately 3 weeks and had them several years ago when she "tweaked her back". Patient denies any urinary symptoms at this time no frequency or dysuria. Patient has no fevers chills no flank pain pain is localized to below waistline. Patient also has endorsement of right lower quadrant pain that is not associated with nausea or emesis nor diarrhea. Patient denies history of hernia however has had cholecystectomy before. Patient states she has pain control with hydrocodone per her primary care doctor is been treating symptoms for the past 3 weeks with this medication that helps for only 2 hours then the pain returns.  Past Medical History  I reviewed & agree with nursing's documentation on PMHx, PSHx, SHx and FHx. Past Medical History  Diagnosis Date  . HYPERLIPIDEMIA 04/30/2006  . ANXIETY 05/25/2006  . 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 INSOMNIA, PERSISTENT 02/22/2008  . DEPRESSION 01/02/2006  . OBSTRUCTIVE SLEEP APNEA 04/17/2010  . 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 ESOTROPIA, LEFT EYE 1945-12-02  . HYPERTENSION 05/25/2006  . BRONCHITIS, ACUTE 12/08/2007  . COPD 05/25/2006  . T M J 05/11/2009  . OSTEOARTHRITIS 05/25/2006  . LOW BACK PAIN 01/27/2007  . FIBROMYALGIA 04/02/2006  . Headache(784.0) 12/08/2007  . HYPERGLYCEMIA 05/15/2010  . Abdominal pain, epigastric 12/18/2009  . Lazy eye     left  . Polyuria   . Migraines   . Psychosomatic disease 2011   Past Surgical History  Procedure Laterality Date  . Cholecystectomy     Social History   Social History  .  Marital Status: Married    Spouse Name: N/A  . Number of Children: 5  . Years of Education: N/A   Occupational History  . house wife    Social History Main Topics  . Smoking status: Former Smoker    Quit date: 07/10/2013  . Smokeless tobacco: Current User    Types: Snuff     Comment: smoked 1965-2014, up to 1 ppd  . Alcohol Use: No  . Drug Use: No  . Sexual Activity: Not Currently   Other Topics Concern  . None   Social History Narrative   Family History  Problem Relation Age of Onset  . Heart attack Father   . Hypertension Brother   . Heart disease Mother   . Colon cancer Neg Hx   . Stomach cancer Neg Hx     Review of Systems   Complete ROS performed by me, all positives & negatives documented as above in HPI. All other ROS negative.  Allergies  Morphine and related; Bupropion hcl; Duloxetine; Ezetimibe; Metformin and related; Trazodone hcl; Gabapentin; and Tramadol hcl  Home Medications   Prior to Admission medications   Medication Sig Start Date End Date Taking? Authorizing Provider  ALPRAZolam (XANAX) 0.5 MG tablet TAKE 1 TABLET BY MOUTH TWICE DAILY Patient taking differently: Take 0.5 mg by mouth 2 (two) times daily as needed for anxiety.  02/09/15  Yes Aleksei Plotnikov V, MD  Cholecalciferol (VITAMIN D3) 2000 UNITS capsule Take 1 capsule (2,000 Units total) by mouth daily. 08/22/14  Yes Aleksei Plotnikov V, MD  HYDROcodone-acetaminophen (NORCO) 7.5-325 MG tablet Take 1 tablet by mouth every 8 (eight) hours as needed for severe pain. 05/15/15  Yes Aleksei Plotnikov V, MD  metoprolol succinate (TOPROL-XL) 25 MG 24 hr tablet TAKE 1 TABLET BY MOUTH EVERY MORNING 10/23/14  Yes Aleksei Plotnikov V, MD  acyclovir (ZOVIRAX) 800 MG tablet Take 1 tablet (800 mg total) by mouth 5 (five) times daily. Patient not taking: Reported on 05/21/2015 12/01/14   Lew Dawes V, MD  ciprofloxacin (CIPRO) 250 MG tablet Take 1 tablet (250 mg total) by mouth 2 (two) times  daily. Patient not taking: Reported on 05/21/2015 05/15/15   Lew Dawes V, MD  fluconazole (DIFLUCAN) 150 MG tablet Take 1 tablet (150 mg total) by mouth daily. Repeat dose in 3 days Patient not taking: Reported on 05/21/2015 10/06/14   Waldemar Dickens, MD  oxyCODONE-acetaminophen (PERCOCET/ROXICET) 5-325 MG tablet Take 2 tablets by mouth every 4 (four) hours as needed for severe pain. 05/21/15   Voncille Lo, MD  ranitidine (ZANTAC) 150 MG tablet Take 1 tablet (150 mg total) by mouth 2 (two) times daily. Patient not taking: Reported on 05/21/2015 07/25/14   Noemi Chapel, MD    Physical Exam  BP 132/61 mmHg  Pulse 64  Temp(Src) 97.9 F (36.6 C) (Oral)  Resp 18  SpO2 98% Physical Exam  Constitutional: She is oriented to person, place, and time. She appears well-developed and well-nourished.  Non-toxic appearance. She does not appear ill. No distress.  HENT:  Head: Normocephalic and atraumatic.  Right Ear: External ear normal.  Left Ear: External ear normal.  Eyes: Pupils are equal, round, and reactive to light. No scleral icterus.  Neck: Normal range of motion. Neck supple. No tracheal deviation present.  Cardiovascular: Normal heart sounds and intact distal pulses.   No murmur heard. Pulmonary/Chest: Effort normal and breath sounds normal. No stridor. No respiratory distress. She has no wheezes. She has no rales.  Abdominal: Soft. Bowel sounds are normal. She exhibits no distension and no mass. There is no tenderness. There is no rebound and no guarding.  Absent flank pain bilaterally.  Musculoskeletal: Normal range of motion. She exhibits tenderness (right sided sacroliliac joint ttp. ). She exhibits no edema.  Without saddle hypoesthesia. Legs with strength 5/5 bilaterally. Sensation of LE bilaterally intact.   Neurological: She is alert and oriented to person, place, and time. She has normal strength and normal reflexes. No cranial nerve deficit or sensory deficit.  Skin: Skin is  warm and dry. No pallor.  Psychiatric: She has a normal mood and affect. Her behavior is normal.  Nursing note and vitals reviewed.   ED Course  Procedures Labs Review Labs Reviewed  URINALYSIS, ROUTINE W REFLEX MICROSCOPIC (NOT AT [facility]) - Abnormal; Notable for the following:    APPearance CLOUDY (*)    Leukocytes, UA SMALL (*)    All other components within normal limits  URINE MICROSCOPIC-ADD ON - Abnormal; Notable for the following:    Squamous Epithelial / LPF FEW (*)    Crystals CA OXALATE CRYSTALS (*)    All other components within normal limits    Imaging Review No results found.  Laboratory and Imaging results were personally reviewed by myself and used in the medical decision making of this patient's treatment and disposition.  EKG Interpretation  EKG Interpretation  Date/Time:    Ventricular Rate:    PR Interval:    QRS Duration:   QT Interval:  QTC Calculation:   R Axis:     Text Interpretation:        MDM  LASHAN MACIAS is a 69 y.o. female with H&P as above. ED clinical course as follows:  Patient denies flank pain.  UA without obvious signs of infectious process, therefore cystitis or pyelonephritis unlikely. Without hematuria, therefore nephrolithiasis unlikely.  Pain resolved with PO analgesia.   Without saddle hypoesthesia. Patient without bowel/bladder retention or incontinence. Legs with strength and sensation intact. Denies constitutional symptoms and no midline ttp. Not likely cord compression from tumor, mass and HPI negative for trauma Hx.  Leg endorsement not consistent with Intracranial vascular distribution. Do not suspect CVA or TIA or hemorrhage.  Prior to discharge, I had bedside discussion regarding considered etiologies along with explaining the implications of their  ED evaluation. Both nursing and I confirmed all concerns and questions had been addressed, along with confirming the patient's understanding of return precautions that  would necessitate immediate return to the ED. Through shared decision making, follow up agreed upon and patient requests discharge at this time. The patient is stable and is discharged to home in good condition.  The patient requires no prescription upon discharge.  Disposition: Discharge.   Clinical Impression:  1. Sciatic nerve pain, right   2. Neuropathy [facility])    Patient care discussed with Dr. Alfonse Spruce, who oversaw their evaluation & treatment & voiced agreement. House Officer: Voncille Lo, MD, Emergency Medicine.  Voncille Lo, MD 05/23/15 [id number]  Harvel Quale, MD 05/24/15 [phone]

## 2015-05-21 NOTE — ED Notes (Signed)
Discharge instructions and prescription given and reviewed - voiced understanding

## 2015-05-21 NOTE — Discharge Instructions (Signed)
Back Exercises The following exercises strengthen the muscles that help to support the back. They also help to keep the lower back flexible. Doing these exercises can help to prevent back pain or lessen existing pain. If you have back pain or discomfort, try doing these exercises 2-3 times each day or as told by your health care provider. When the pain goes away, do them once each day, but increase the number of times that you repeat the steps for each exercise (do more repetitions). If you do not have back pain or discomfort, do these exercises once each day or as told by your health care provider. EXERCISES Single Knee to Chest Repeat these steps 3-5 times for each leg:  Lie on your back on a firm bed or the floor with your legs extended.  Bring one knee to your chest. Your other leg should stay extended and in contact with the floor.  Hold your knee in place by grabbing your knee or thigh.  Pull on your knee until you feel a gentle stretch in your lower back.  Hold the stretch for 10-30 seconds.  Slowly release and straighten your leg. Pelvic Tilt Repeat these steps 5-10 times:  Lie on your back on a firm bed or the floor with your legs extended.  Bend your knees so they are pointing toward the ceiling and your feet are flat on the floor.  Tighten your lower abdominal muscles to press your lower back against the floor. This motion will tilt your pelvis so your tailbone points up toward the ceiling instead of pointing to your feet or the floor.  With gentle tension and even breathing, hold this position for 5-10 seconds. Cat-Cow Repeat these steps until your lower back becomes more flexible:  Get into a hands-and-knees position on a firm surface. Keep your hands under your shoulders, and keep your knees under your hips. You may place padding under your knees for comfort.  Let your head hang down, and point your tailbone toward the floor so your lower back becomes rounded like the  back of a cat.  Hold this position for 5 seconds.  Slowly lift your head and point your tailbone up toward the ceiling so your back forms a sagging arch like the back of a cow.  Hold this position for 5 seconds. Press-Ups Repeat these steps 5-10 times:  Lie on your abdomen (face-down) on the floor.  Place your palms near your head, about shoulder-width apart.  While you keep your back as relaxed as possible and keep your hips on the floor, slowly straighten your arms to raise the top half of your body and lift your shoulders. Do not use your back muscles to raise your upper torso. You may adjust the placement of your hands to make yourself more comfortable.  Hold this position for 5 seconds while you keep your back relaxed.  Slowly return to lying flat on the floor. Bridges Repeat these steps 10 times:  Lie on your back on a firm surface.  Bend your knees so they are pointing toward the ceiling and your feet are flat on the floor.  Tighten your buttocks muscles and lift your buttocks off of the floor until your waist is at almost the same height as your knees. You should feel the muscles working in your buttocks and the back of your thighs. If you do not feel these muscles, slide your feet 1-2 inches farther away from your buttocks.  Hold this position for 3-5  seconds.  Slowly lower your hips to the starting position, and allow your buttocks muscles to relax completely. If this exercise is too easy, try doing it with your arms crossed over your chest. Abdominal Crunches Repeat these steps 5-10 times:  Lie on your back on a firm bed or the floor with your legs extended.  Bend your knees so they are pointing toward the ceiling and your feet are flat on the floor.  Cross your arms over your chest.  Tip your chin slightly toward your chest without bending your neck.  Tighten your abdominal muscles and slowly raise your trunk (torso) high enough to lift your shoulder blades a  tiny bit off of the floor. Avoid raising your torso higher than that, because it can put too much stress on your low back and it does not help to strengthen your abdominal muscles.  Slowly return to your starting position. Back Lifts Repeat these steps 5-10 times:  Lie on your abdomen (face-down) with your arms at your sides, and rest your forehead on the floor.  Tighten the muscles in your legs and your buttocks.  Slowly lift your chest off of the floor while you keep your hips pressed to the floor. Keep the back of your head in line with the curve in your back. Your eyes should be looking at the floor.  Hold this position for 3-5 seconds.  Slowly return to your starting position. SEEK MEDICAL CARE IF:  Your back pain or discomfort gets much worse when you do an exercise.  Your back pain or discomfort does not lessen within 2 hours after you exercise. If you have any of these problems, stop doing these exercises right away. Do not do them again unless your health care provider says that you can. SEEK IMMEDIATE MEDICAL CARE IF:  You develop sudden, severe back pain. If this happens, stop doing the exercises right away. Do not do them again unless your health care provider says that you can.   This information is not intended to replace advice given to you by your health care provider. Make sure you discuss any questions you have with your health care provider.   Document Released: 08/07/2004 Document Revised: 03/21/2015 Document Reviewed: 08/24/2014 Elsevier Interactive Patient Education 2016 [place].  Back Exercises If you have pain in your back, do these exercises 2-3 times each day or as told by your doctor. When the pain goes away, do the exercises once each day, but repeat the steps more times for each exercise (do more repetitions). If you do not have pain in your back, do these exercises once each day or as told by your doctor. EXERCISES Single Knee to Chest Do these  steps 3-5 times in a row for each leg:  Lie on your back on a firm bed or the floor with your legs stretched out.  Bring one knee to your chest.  Hold your knee to your chest by grabbing your knee or thigh.  Pull on your knee until you feel a gentle stretch in your lower back.  Keep doing the stretch for 10-30 seconds.  Slowly let go of your leg and straighten it. Pelvic Tilt Do these steps 5-10 times in a row:  Lie on your back on a firm bed or the floor with your legs stretched out.  Bend your knees so they point up to the ceiling. Your feet should be flat on the floor.  Tighten your lower belly (abdomen) muscles to press your  lower back against the floor. This will make your tailbone point up to the ceiling instead of pointing down to your feet or the floor.  Stay in this position for 5-10 seconds while you gently tighten your muscles and breathe evenly. Cat-Cow Do these steps until your lower back bends more easily:  Get on your hands and knees on a firm surface. Keep your hands under your shoulders, and keep your knees under your hips. You may put padding under your knees.  Let your head hang down, and make your tailbone point down to the floor so your lower back is round like the back of a cat.  Stay in this position for 5 seconds.  Slowly lift your head and make your tailbone point up to the ceiling so your back hangs low (sags) like the back of a cow.  Stay in this position for 5 seconds. Press-Ups Do these steps 5-10 times in a row:  Lie on your belly (face-down) on the floor.  Place your hands near your head, about shoulder-width apart.  While you keep your back relaxed and keep your hips on the floor, slowly straighten your arms to raise the top half of your body and lift your shoulders. Do not use your back muscles. To make yourself more comfortable, you may change where you place your hands.  Stay in this position for 5 seconds.  Slowly return to lying flat  on the floor. Bridges Do these steps 10 times in a row:  Lie on your back on a firm surface.  Bend your knees so they point up to the ceiling. Your feet should be flat on the floor.  Tighten your butt muscles and lift your butt off of the floor until your waist is almost as high as your knees. If you do not feel the muscles working in your butt and the back of your thighs, slide your feet 1-2 inches farther away from your butt.  Stay in this position for 3-5 seconds.  Slowly lower your butt to the floor, and let your butt muscles relax. If this exercise is too easy, try doing it with your arms crossed over your chest. Belly Crunches Do these steps 5-10 times in a row:  Lie on your back on a firm bed or the floor with your legs stretched out.  Bend your knees so they point up to the ceiling. Your feet should be flat on the floor.  Cross your arms over your chest.  Tip your chin a little bit toward your chest but do not bend your neck.  Tighten your belly muscles and slowly raise your chest just enough to lift your shoulder blades a tiny bit off of the floor.  Slowly lower your chest and your head to the floor. Back Lifts Do these steps 5-10 times in a row:  Lie on your belly (face-down) with your arms at your sides, and rest your forehead on the floor.  Tighten the muscles in your legs and your butt.  Slowly lift your chest off of the floor while you keep your hips on the floor. Keep the back of your head in line with the curve in your back. Look at the floor while you do this.  Stay in this position for 3-5 seconds.  Slowly lower your chest and your face to the floor. GET HELP IF:  Your back pain gets a lot worse when you do an exercise.  Your back pain does not lessen 2 hours after  you exercise. If you have any of these problems, stop doing the exercises. Do not do them again unless your doctor says it is okay. GET HELP RIGHT AWAY IF:  You have sudden, very bad back  pain. If this happens, stop doing the exercises. Do not do them again unless your doctor says it is okay.   This information is not intended to replace advice given to you by your health care provider. Make sure you discuss any questions you have with your health care provider.   Document Released: 08/02/2010 Document Revised: 03/21/2015 Document Reviewed: 08/24/2014 Elsevier Interactive Patient Education 2016 [employer]  Peripheral Neuropathy Peripheral neuropathy is a type of nerve damage. It affects nerves that carry signals between the spinal cord and other parts of the body. These are called peripheral nerves. With peripheral neuropathy, one nerve or a group of nerves may be damaged.  CAUSES  Many things can damage peripheral nerves. For some people with peripheral neuropathy, the cause is unknown. Some causes include:  Diabetes. This is the most common cause of peripheral neuropathy.  Injury to a nerve.  Pressure or stress on a nerve that lasts a long time.  Too little vitamin B. Alcoholism can lead to this.  Infections.  Autoimmune diseases, such as multiple sclerosis and systemic lupus erythematosus.  Inherited nerve diseases.  Some medicines, such as cancer drugs.  Toxic substances, such as lead and mercury.  Too little blood flowing to the legs.  Kidney disease.  Thyroid disease. SIGNS AND SYMPTOMS  Different people have different symptoms. The symptoms you have will depend on which of your nerves is damaged. Common symptoms include:  Loss of feeling (numbness) in the feet and hands.  Tingling in the feet and hands.  Pain that burns.  Very sensitive skin.  Weakness.  Not being able to move a part of the body (paralysis).  Muscle twitching.  Clumsiness or poor coordination.  Loss of balance.  Not being able to control your bladder.  Feeling dizzy.  Sexual problems. DIAGNOSIS  Peripheral neuropathy is a symptom, not a disease. Finding the  cause of peripheral neuropathy can be hard. To figure that out, your health care provider will take a medical history and do a physical exam. A neurological exam will also be done. This involves checking things affected by your brain, spinal cord, and nerves (nervous system). For example, your health care provider will check your reflexes, how you move, and what you can feel.  Other types of tests may also be ordered, such as:  Blood tests.  A test of the fluid in your spinal cord.  Imaging tests, such as CT scans or an MRI.  Electromyography (EMG). This test checks the nerves that control muscles.  Nerve conduction velocity tests. These tests check how fast messages pass through your nerves.  Nerve biopsy. A small piece of nerve is removed. It is then checked under a microscope. TREATMENT   Medicine is often used to treat peripheral neuropathy. Medicines may include:  Pain-relieving medicines. Prescription or over-the-counter medicine may be suggested.  Antiseizure medicine. This may be used for pain.  Antidepressants. These also may help ease pain from neuropathy.  Lidocaine. This is a numbing medicine. You might wear a patch or be given a shot.  Mexiletine. This medicine is typically used to help control irregular heart rhythms.  Surgery. Surgery may be needed to relieve pressure on a nerve or to destroy a nerve that is causing pain.  Physical therapy to  help movement.  Assistive devices to help movement. HOME CARE INSTRUCTIONS   Only take over-the-counter or prescription medicines as directed by your health care provider. Follow the instructions carefully for any given medicines. Do not take any other medicines without first getting approval from your health care provider.  If you have diabetes, work closely with your health care provider to keep your blood sugar under control.  If you have numbness in your feet:  Check every day for signs of injury or infection. Watch  for redness, warmth, and swelling.  Wear padded socks and comfortable shoes. These help protect your feet.  Do not do things that put pressure on your damaged nerve.  Do not smoke. Smoking keeps blood from getting to damaged nerves.  Avoid or limit alcohol. Too much alcohol can cause a lack of B vitamins. These vitamins are needed for healthy nerves.  Develop a good support system. Coping with peripheral neuropathy can be stressful. Talk to a mental health specialist or join a support group if you are struggling.  Follow up with your health care provider as directed. SEEK MEDICAL CARE IF:   You have new signs or symptoms of peripheral neuropathy.  You are struggling emotionally from dealing with peripheral neuropathy.  You have a fever. SEEK IMMEDIATE MEDICAL CARE IF:   You have an injury or infection that is not healing.  You feel very dizzy or begin vomiting.  You have chest pain.  You have trouble breathing.   This information is not intended to replace advice given to you by your health care provider. Make sure you discuss any questions you have with your health care provider.   Document Released: 06/20/2002 Document Revised: 03/12/2011 Document Reviewed: 03/07/2013 Elsevier Interactive Patient Education [employer].

## 2015-05-29 ENCOUNTER — Encounter: Payer: Self-pay | Admitting: Internal Medicine

## 2015-05-29 ENCOUNTER — Ambulatory Visit (INDEPENDENT_AMBULATORY_CARE_PROVIDER_SITE_OTHER): Payer: Medicare Other | Admitting: Internal Medicine

## 2015-05-29 VITALS — BP 142/80 | HR 73 | Wt 208.0 lb

## 2015-05-29 DIAGNOSIS — E669 Obesity, unspecified: Secondary | ICD-10-CM | POA: Diagnosis not present

## 2015-05-29 DIAGNOSIS — G5710 Meralgia paresthetica, unspecified lower limb: Secondary | ICD-10-CM | POA: Insufficient documentation

## 2015-05-29 DIAGNOSIS — G5711 Meralgia paresthetica, right lower limb: Secondary | ICD-10-CM | POA: Diagnosis not present

## 2015-05-29 DIAGNOSIS — M545 Low back pain: Secondary | ICD-10-CM | POA: Diagnosis not present

## 2015-05-29 MED ORDER — NORTRIPTYLINE HCL 10 MG PO CAPS: 10.0000 mg | ORAL_CAPSULE | Freq: Three times a day (TID) | ORAL | Status: DC | PRN

## 2015-05-29 NOTE — Assessment & Plan Note (Signed)
11/16 RLE - large abd causing groin pressure Releve pressure Use Pamelor Zostrix topically

## 2015-05-29 NOTE — Progress Notes (Signed)
Pre visit review using our clinic review tool, if applicable. No additional management support is needed unless otherwise documented below in the visit note. 

## 2015-05-29 NOTE — Patient Instructions (Signed)
Use Zostrix cream . Apply with gloves on to right thigh area

## 2015-05-29 NOTE — Progress Notes (Signed)
Subjective:  Patient ID: Amanda Arnold, female    DOB: [birth year]  Age: 70 y.o. MRN: [id number]  CC: No chief complaint on file.   HPI Amanda Arnold presents for R anterolat thigh burning pain - severe x 1-2 weeks. Pt went to ER for it. F/u LBP/OA  Outpatient Prescriptions Prior to Visit  Medication Sig Dispense Refill  . ALPRAZolam (XANAX) 0.5 MG tablet TAKE 1 TABLET BY MOUTH TWICE DAILY (Patient taking differently: Take 0.5 mg by mouth 2 (two) times daily as needed for anxiety. ) 60 tablet 2  . Cholecalciferol (VITAMIN D3) 2000 UNITS capsule Take 1 capsule (2,000 Units total) by mouth daily. 100 capsule 3  . HYDROcodone-acetaminophen (NORCO) 7.5-325 MG tablet Take 1 tablet by mouth every 8 (eight) hours as needed for severe pain. 90 tablet 0  . metoprolol succinate (TOPROL-XL) 25 MG 24 hr tablet TAKE 1 TABLET BY MOUTH EVERY MORNING 90 tablet 3  . ranitidine (ZANTAC) 150 MG tablet Take 1 tablet (150 mg total) by mouth 2 (two) times daily. 60 tablet 0  . ciprofloxacin (CIPRO) 250 MG tablet Take 1 tablet (250 mg total) by mouth 2 (two) times daily. 10 tablet 0  . fluconazole (DIFLUCAN) 150 MG tablet Take 1 tablet (150 mg total) by mouth daily. Repeat dose in 3 days 2 tablet 0  . acyclovir (ZOVIRAX) 800 MG tablet Take 1 tablet (800 mg total) by mouth 5 (five) times daily. (Patient not taking: Reported on 05/29/2015) 35 tablet 0  . oxyCODONE-acetaminophen (PERCOCET/ROXICET) 5-325 MG tablet Take 2 tablets by mouth every 4 (four) hours as needed for severe pain. (Patient not taking: Reported on 05/29/2015) 18 tablet 0   No facility-administered medications prior to visit.    ROS Review of Systems  Constitutional: Negative for chills, activity change, appetite change, fatigue and unexpected weight change.  HENT: Negative for congestion, mouth sores and sinus pressure.   Eyes: Negative for visual disturbance.  Respiratory: Negative for cough and chest tightness.   Gastrointestinal:  Negative for nausea and abdominal pain.  Genitourinary: Negative for frequency, difficulty urinating and vaginal pain.  Musculoskeletal: Negative for back pain and gait problem.  Skin: Negative for pallor and rash.  Neurological: Positive for numbness. Negative for dizziness, tremors, weakness and headaches.  Psychiatric/Behavioral: Negative for confusion and sleep disturbance. The patient is not nervous/anxious.     Objective:  BP 142/80 mmHg  Pulse 73  Wt 208 lb (94.348 kg)  SpO2 91%  BP Readings from Last 3 Encounters:  05/29/15 142/80  05/21/15 132/61  05/15/15 138/82    Wt Readings from Last 3 Encounters:  05/29/15 208 lb (94.348 kg)  05/15/15 208 lb (94.348 kg)  02/09/15 206 lb (93.441 kg)    Physical Exam  Constitutional: She appears well-developed. No distress.  HENT:  Head: Normocephalic.  Right Ear: External ear normal.  Left Ear: External ear normal.  Nose: Nose normal.  Mouth/Throat: Oropharynx is clear and moist.  Eyes: Conjunctivae are normal. Pupils are equal, round, and reactive to light. Right eye exhibits no discharge. Left eye exhibits no discharge.  Neck: Normal range of motion. Neck supple. No JVD present. No tracheal deviation present. No thyromegaly present.  Cardiovascular: Normal rate, regular rhythm and normal heart sounds.   Pulmonary/Chest: No stridor. No respiratory distress. She has no wheezes.  Abdominal: Soft. Bowel sounds are normal. She exhibits no distension and no mass. There is no tenderness. There is no rebound and no guarding.  Musculoskeletal: She exhibits tenderness.  She exhibits no edema.  Lymphadenopathy:    She has no cervical adenopathy.  Neurological: She displays normal reflexes. No cranial nerve deficit. She exhibits normal muscle tone. Coordination normal.  Skin: No rash noted. No erythema.  Psychiatric: She has a normal mood and affect. Her behavior is normal. Judgment and thought content normal.  15x20 cm oval area of  decreased sensation R anterolat thigh  Lab Results  Component Value Date   WBC 6.4 02/09/2015   HGB 13.2 02/09/2015   HCT 39.0 02/09/2015   PLT 200.0 02/09/2015   GLUCOSE 133* 05/15/2015   CHOL 183 04/21/2014   TRIG 155.0* 04/21/2014   HDL 44.00 04/21/2014   LDLCALC 108* 04/21/2014   ALT 31 05/15/2015   AST 33 05/15/2015   NA 142 05/15/2015   K 3.9 05/15/2015   CL 107 05/15/2015   CREATININE 0.71 05/15/2015   BUN 16 05/15/2015   CO2 28 05/15/2015   TSH 1.07 05/15/2015   HGBA1C 6.0 05/15/2015    No results found.  Assessment & Plan:   Diagnoses and all orders for this visit:  Meralgia paraesthetica, right  Other orders -     nortriptyline (PAMELOR) 10 MG capsule; Take 1 capsule (10 mg total) by mouth 3 (three) times daily as needed (burning pain).  I have discontinued [name]'s fluconazole, acyclovir, ciprofloxacin, and oxyCODONE-acetaminophen. I am also having her start on nortriptyline. Additionally, I am having her maintain her ranitidine, Vitamin D3, metoprolol succinate, ALPRAZolam, and HYDROcodone-acetaminophen.  Meds ordered this encounter  Medications  . nortriptyline (PAMELOR) 10 MG capsule    Sig: Take 1 capsule (10 mg total) by mouth 3 (three) times daily as needed (burning pain).    Dispense:  90 capsule    Refill:  3     Follow-up: Return in about 4 weeks (around 06/26/2015) for a follow-up visit.  Walker Kehr, MD

## 2015-05-31 ENCOUNTER — Telehealth: Payer: Self-pay

## 2015-05-31 NOTE — Telephone Encounter (Signed)
Call to the patient for Albumin creatinine ratio by Nov 30 and to discuss AWV;  If the patient can't come for AWV; please  Advise that urine test needs to be done and see if she will drop by to do this; I can order if she agrees;

## 2015-06-01 ENCOUNTER — Ambulatory Visit
Admission: RE | Admit: 2015-06-01 | Discharge: 2015-06-01 | Disposition: A | Discharge status: Home / Self Care | Payer: Medicare Other | Source: Ambulatory Visit

## 2015-06-01 DIAGNOSIS — Z1231 Encounter for screening mammogram for malignant neoplasm of breast: Secondary | ICD-10-CM | POA: Diagnosis not present

## 2015-06-03 NOTE — Assessment & Plan Note (Signed)
Norco prn  Potential benefits of a long term opioids use as well as potential risks (i.e. addiction risk, apnea etc) and complications (i.e. Somnolence, constipation and others) were explained to the patient and were aknowledged. 

## 2015-06-03 NOTE — Assessment & Plan Note (Signed)
Wt Readings from Last 3 Encounters:  05/29/15 208 lb (94.348 kg)  05/15/15 208 lb (94.348 kg)  02/09/15 206 lb (93.441 kg)

## 2015-06-04 NOTE — Telephone Encounter (Signed)
2nd outreach for AWV and/or to completed urine albumin. Left practice number and my direct line.

## 2015-07-03 ENCOUNTER — Ambulatory Visit (INDEPENDENT_AMBULATORY_CARE_PROVIDER_SITE_OTHER): Payer: Medicare Other | Admitting: Internal Medicine

## 2015-07-03 ENCOUNTER — Encounter: Payer: Self-pay | Admitting: Internal Medicine

## 2015-07-03 VITALS — BP 120/80 | HR 74 | Temp 98.0°F | Ht 62.0 in | Wt 207.0 lb

## 2015-07-03 DIAGNOSIS — R1031 Right lower quadrant pain: Secondary | ICD-10-CM | POA: Diagnosis not present

## 2015-07-03 DIAGNOSIS — M5430 Sciatica, unspecified side: Secondary | ICD-10-CM

## 2015-07-03 DIAGNOSIS — E119 Type 2 diabetes mellitus without complications: Secondary | ICD-10-CM

## 2015-07-03 DIAGNOSIS — Z0000 Encounter for general adult medical examination without abnormal findings: Secondary | ICD-10-CM | POA: Insufficient documentation

## 2015-07-03 DIAGNOSIS — G5711 Meralgia paresthetica, right lower limb: Secondary | ICD-10-CM

## 2015-07-03 DIAGNOSIS — F411 Generalized anxiety disorder: Secondary | ICD-10-CM | POA: Diagnosis not present

## 2015-07-03 DIAGNOSIS — I10 Essential (primary) hypertension: Secondary | ICD-10-CM | POA: Diagnosis not present

## 2015-07-03 MED ORDER — HYDROCODONE-ACETAMINOPHEN 7.5-325 MG PO TABS: 1.0000 | ORAL_TABLET | Freq: Three times a day (TID) | ORAL | Status: DC | PRN

## 2015-07-03 NOTE — Assessment & Plan Note (Signed)
On PRN Xanax  Potential benefits of a long term benzodiazepines  use as well as potential risks  and complications were explained to the patient and were aknowledged. 

## 2015-07-03 NOTE — Progress Notes (Signed)
Pre visit review using our clinic review tool, if applicable. No additional management support is needed unless otherwise documented below in the visit note. 

## 2015-07-03 NOTE — Assessment & Plan Note (Signed)
  On diet  

## 2015-07-03 NOTE — Assessment & Plan Note (Signed)
Resolving

## 2015-07-03 NOTE — Progress Notes (Signed)
Subjective:   Amanda Arnold is a 69 y.o. female who presents for Medicare Annual (Subsequent) preventive examination.  Review of Systems:     HRA assessment completed during visit; Gretchin, Goldschmidt The Patient was informed that this wellness visit is to identify risk and educate on how to reduce risk for increase disease through lifestyle changes.   ROS deferred to CPE exam with physician this am  Described health as fair; states sleep is an important factor  Lipids 04/2014;  Chol 183; trig 155; HDL 44; LDL 108; ratio 4 / (05/15/2015 A1c 6.0-Pre-diabetes) EF 64% Jan 2016 Vit d low 2011;   BMI: 37.9 Diet; did cut down on eating bread; still eats meat but not as much; Love to eat sandwiches but cut this out; cut out orange juice  Drinking cokes;  Substitutes fruit as apples; banana for breakfast; eat peanut butter   Drinking water; a cup Discussed new efforts to lose weight which was to develop a strategy to not eat hast hs while watching TV.   Quit smoking over a year ago;  Couldn't breath and got a cough; started to wheeze; Will smoke occasionally when she gets upset; She does not buy them and doesn't keep them in the house; Has firm boundaries.  Husband quit as well   Exercise; states she cleans home and walks all day; has a dog and takes dog to the park; can't walk much due to pain in legs;   SAFETY/ one level home Safety reviewed for the home; including removal of clutter; clear paths through the home, bathroom safety reviewed; has grab bar and has a seat; community safety; smoke detectors and firearms safety reviewed;  Driving accidents and seatbelt Sun protection; wears to the pool with kids; advocated for sun screen or hat if out in the sun. Stressors; yes but did not disclose today;   Medication review/ no new meds  Fall assessment no falls Gait assessment; get up and go normal For average week; how many days are good days? Depends on how she sleeps If she sleeps,  she feels good; some numbness at hs and always turning and up and down using the bathroom;   Mobilization and Functional losses in the last year. no Sleep patterns gets up to go to bathroom, turns multiple times and wakes up worrying; did address with goals   Urinary or fecal incontinence reviewed/ doesn't feel she has urinary urgency   Counseling: Hep c/ educated/ Declines today but will have at next blood draw Ua microablumin/ declines today Colonoscopy; 08/2013 repeat 08/2018 EKG: 07/27/2014 Hearing: 4000 hz  Dexa; postpone today but can check with insurance to be sure there are no copays of which she is concerned about.  Mammagram 06/01/2015 No mammographic evidence of malignancy Ophthalmology exam; 07/25/2014 Had cataracts taken off this year; vision is still blurry/ eye doctor following; No diseases of the eye.   Immunizations Due Tetanus, zostavax;  Prevnar: will consider at a later time Did not want to discuss immunizations;   Current Care Team reviewed and updated     Objective:     Vitals: BP 120/80 mmHg  Pulse 74  Temp(Src) 98 F (36.7 C)  Ht 5\' 2"  (1.575 m)  Wt 207 lb (93.895 kg)  BMI 37.85 kg/m2  SpO2 95%  Tobacco History  Smoking status  . Former Smoker  . Quit date: 07/10/2013  Smokeless tobacco  . Current User  . Types: Snuff    Comment: smoked 1965-2014, up to 1  ppd     Ready to quit: Not Answered Counseling given: Not Answered   Past Medical History  Diagnosis Date  . HYPERLIPIDEMIA 04/30/2006  . ANXIETY 05/25/2006  . 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 INSOMNIA, PERSISTENT 02/22/2008  . DEPRESSION 01/02/2006  . OBSTRUCTIVE SLEEP APNEA 04/17/2010  . 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 ESOTROPIA, LEFT EYE Jan 13, 1946  . HYPERTENSION 05/25/2006  . BRONCHITIS, ACUTE 12/08/2007  . COPD 05/25/2006  . T M J 05/11/2009  . OSTEOARTHRITIS 05/25/2006  . LOW BACK PAIN 01/27/2007  . FIBROMYALGIA 04/02/2006  . Headache(784.0) 12/08/2007  .  HYPERGLYCEMIA 05/15/2010  . Abdominal pain, epigastric 12/18/2009  . Lazy eye     left  . Polyuria   . Migraines   . Psychosomatic disease 2011   Past Surgical History  Procedure Laterality Date  . Cholecystectomy     Family History  Problem Relation Age of Onset  . Heart attack Father   . Hypertension Brother   . Heart disease Mother   . Colon cancer Neg Hx   . Stomach cancer Neg Hx    History  Sexual Activity  . Sexual Activity: Not Currently    Outpatient Encounter Prescriptions as of 07/03/2015  Medication Sig  . ALPRAZolam (XANAX) 0.5 MG tablet TAKE 1 TABLET BY MOUTH TWICE DAILY (Patient taking differently: Take 0.5 mg by mouth 2 (two) times daily as needed for anxiety. )  . Cholecalciferol (VITAMIN D3) 2000 UNITS capsule Take 1 capsule (2,000 Units total) by mouth daily.  Marland Kitchen HYDROcodone-acetaminophen (NORCO) 7.5-325 MG tablet Take 1 tablet by mouth every 8 (eight) hours as needed for severe pain.  . metoprolol succinate (TOPROL-XL) 25 MG 24 hr tablet TAKE 1 TABLET BY MOUTH EVERY MORNING  . nortriptyline (PAMELOR) 10 MG capsule Take 1 capsule (10 mg total) by mouth 3 (three) times daily as needed (burning pain).  . ranitidine (ZANTAC) 150 MG tablet Take 1 tablet (150 mg total) by mouth 2 (two) times daily.  . [DISCONTINUED] HYDROcodone-acetaminophen (NORCO) 7.5-325 MG tablet Take 1 tablet by mouth every 8 (eight) hours as needed for severe pain.   No facility-administered encounter medications on file as of 07/03/2015.    Activities of Daily Living In your present state of health, do you have any difficulty performing the following activities: 07/03/2015  Hearing? N  Vision? N  Difficulty concentrating or making decisions? N  Walking or climbing stairs? N  Dressing or bathing? N  Doing errands, shopping? N  Preparing Food and eating ? N  Using the Toilet? N  In the past six months, have you accidently leaked urine? N  Do you have problems with loss of bowel control?  N  Managing your Medications? N  Managing your Finances? N  Housekeeping or managing your Housekeeping? N    Patient Care Team: Cassandria Anger, MD as PCP - General Erline Levine, MD as Consulting Physician (Neurosurgery)    Assessment:    Assessment   Patient presents for yearly preventative medicine examination. Medicare questionnaire screening were completed, i.e. Functional; fall risk; depression, memory loss and hearing are all unremakable  All immunizations and health maintenance protocols were reviewed with the patient and declines all for now; will consider her pneumonia vaccines as she states she did fine with PSV 23 given x 69 yo.   Education provided for laboratory screens including hep c if not completed  Medication reconciliation, past medical history, social history, problem list and allergies were reviewed in detail with the patient  Goals  were established with regard to weight loss, exercise, and diet reviewed. The patient did engage in this conversation as she is in pre-comtemplating stage and considering; Brain stormed on options for eating during TV time at hs, including walking in place or moving during the commercials;   End of life planning was discussed and declined at present.   Exercise Activities and Dietary recommendations    Goals    . patient     Book; "saying goodnight to insomnia"  Or other sleep type workbook     . Weight < 180 lb (81.647 kg)     Plan; watches the cooking shows and weight loss programs; popcorn Foods at hs; baked kale at hs; bowl of cereal; discussed walking during commercials;  100 calories /individual serving       Fall Risk Fall Risk  07/03/2015 09/22/2014  Falls in the past year? No No   Depression Screen PHQ 2/9 Scores 07/03/2015 09/22/2014 09/22/2014  PHQ - 2 Score 0 0 0    Affect appropriate;   Cognitive Testing No flowsheet data found.   Denies any issues and did not have time to complete in full. No  issues or failures of ADL's admitted.   Immunization History  Administered Date(s) Administered  . Pneumococcal Polysaccharide-23 04/14/2005   Declines further Vaccines but will consider pneumonia   Screening Tests Health Maintenance  Topic Date Due  . Hepatitis C Screening  Jun 15, 1946  . FOOT EXAM  04/25/1958  . URINE MICROALBUMIN  04/25/1958  . TETANUS/TDAP  04/26/1967  . ZOSTAVAX  04/25/2008  . DEXA SCAN  04/25/2013  . PNA vac Low Risk Adult (1 of 2 - PCV13) 04/25/2013  . INFLUENZA VACCINE  07/02/2016 (Originally 02/12/2015)  . OPHTHALMOLOGY EXAM  07/26/2015  . HEMOGLOBIN A1C  11/12/2015  . MAMMOGRAM  05/31/2017  . COLONOSCOPY  08/29/2019      Plan:   Will consider hep c when blood work drawn / declined today Will consider ua microalbumin but not today Declines vaccines but will consider pneumonia as she got along well with the first Discussed Dexa and educated but declines for now; is concerned about insurance and explained dexa at 15 is covered; Encouraged to call Customer service with insurer but stated she did not wish to fup right now; May consider in the future; will postpone for now   During the course of the visit the patient was educated and counseled about the following appropriate screening and preventive services:   Vaccines to include Pneumoccal, Influenza, Hepatitis B, Td, Zostavax, HCV  Electrocardiogram 07/2012   Cardiovascular Disease/ BP good; working on diet;   Colorectal cancer screening/ completed 08/2013 and will repeat 08/2018  Bone density screening / postponed per the patient  Diabetes screening/ does not check BS at home; but did cut back on sugar;   A1c 6;   Glaucoma screening/ completed and is normal   Mammography/PAP/ completed  Nutrition counseling / spent 15 minutes discussing strategies for add'l weight loss of which the patient engaged in. She declines preventive screens at this time.  Patient Instructions (the written plan) was  given to the patient.   Wynetta Fines, RN  07/03/2015

## 2015-07-03 NOTE — Assessment & Plan Note (Signed)
Norco prn  Potential benefits of a long term opioids use as well as potential risks (i.e. addiction risk, apnea etc) and complications (i.e. Somnolence, constipation and others) were explained to the patient and were aknowledged. 

## 2015-07-03 NOTE — Assessment & Plan Note (Signed)
Here for medicare wellness/physical  Diet: heart healthy  Physical activity: sedentary  Depression/mood screen: doing ok on meds Hearing: intact to whispered voice  Visual acuity: grossly normal, performs annual eye exam  ADLs: capable  Fall risk: low Home safety: good  Cognitive evaluation: intact to orientation, naming, recall and repetition  EOL planning: adv directives, full code/ I agree  I have personally reviewed and have noted  1. The patient's medical, surgical and social history  2. Their use of alcohol, tobacco or illicit drugs  3. Their current medications and supplements  4. The patient's functional ability including ADL's, fall risks, home safety risks and hearing or visual impairment.  5. Diet and physical activities  6. Evidence for depression or mood disorders 7. The roster of all physicians providing medical care to patient - is listed in the Snapshot section of the chart and reviewed today.    Today patient counseled on age appropriate routine health concerns for screening and prevention, each reviewed and up to date or declined. Immunizations reviewed and up to date or declined. Labs ordered and reviewed. Risk factors for depression reviewed and negative. Hearing function and visual acuity are intact. ADLs screened and addressed as needed. Functional ability and level of safety reviewed and appropriate. Education, counseling and referrals performed based on assessed risks today. Patient provided with a copy of personalized plan for preventive services.

## 2015-07-03 NOTE — Assessment & Plan Note (Signed)
Toprol XL 

## 2015-07-03 NOTE — Patient Instructions (Signed)
  Amanda Arnold , Thank you for taking time to come for your Medicare Wellness Visit. I appreciate your ongoing commitment to your health goals. Please review the following plan we discussed and let me know if I can assist you in the future.   Discussed taking Prevnar 13; Pneumonia vaccine in the future  May consider dexa scan in the future; will check with insurance on coverage;  Will consider Hep C; antibody screen;    These are the goals we discussed: Goals    . patient     Book; "saying goodnight to insomnia"  Or other sleep type workbook     . Weight < 180 lb (81.647 kg)     Plan; watches the cooking shows and weight loss programs; popcorn Foods at hs; baked kale at hs; bowl of cereal; discussed walking during commercials;  100 calories /individual serving        This is a list of the screening recommended for you and due dates:  Health Maintenance  Topic Date Due  .  Hepatitis C: One time screening is recommended by Center for Disease Control  (CDC) for  adults born from [birth year] through 1963.   Oct 16, 1945  . Complete foot exam   04/25/1958  . Urine Protein Check  04/25/1958  . Tetanus Vaccine  04/26/1967  . Shingles Vaccine  04/25/2008  . DEXA scan (bone density measurement)  04/25/2013  . Pneumonia vaccines (1 of 2 - PCV13) 04/25/2013  . Flu Shot  07/02/2016*  . Eye exam for diabetics  07/26/2015  . Hemoglobin A1C  11/12/2015  . Mammogram  05/31/2017  . Colon Cancer Screening  08/29/2019  *Topic was postponed. The date shown is not the original due date.

## 2015-07-03 NOTE — Progress Notes (Signed)
Subjective:  Patient ID: Amanda Arnold, female    DOB: [birth year]  Age: 69 y.o. MRN: [id number]  CC: No chief complaint on file.   HPI Amanda Arnold presents for a well exam. F/u Meralgia paresthetica R f/u. F/u LBP. C/o GERD. Marland Kitchen  Outpatient Prescriptions Prior to Visit  Medication Sig Dispense Refill  . ALPRAZolam (XANAX) 0.5 MG tablet TAKE 1 TABLET BY MOUTH TWICE DAILY (Patient taking differently: Take 0.5 mg by mouth 2 (two) times daily as needed for anxiety. ) 60 tablet 2  . Cholecalciferol (VITAMIN D3) 2000 UNITS capsule Take 1 capsule (2,000 Units total) by mouth daily. 100 capsule 3  . metoprolol succinate (TOPROL-XL) 25 MG 24 hr tablet TAKE 1 TABLET BY MOUTH EVERY MORNING 90 tablet 3  . nortriptyline (PAMELOR) 10 MG capsule Take 1 capsule (10 mg total) by mouth 3 (three) times daily as needed (burning pain). 90 capsule 3  . ranitidine (ZANTAC) 150 MG tablet Take 1 tablet (150 mg total) by mouth 2 (two) times daily. 60 tablet 0  . HYDROcodone-acetaminophen (NORCO) 7.5-325 MG tablet Take 1 tablet by mouth every 8 (eight) hours as needed for severe pain. 90 tablet 0   No facility-administered medications prior to visit.    ROS Review of Systems  Constitutional: Negative for chills, activity change, appetite change, fatigue and unexpected weight change.  HENT: Negative for congestion, mouth sores and sinus pressure.   Eyes: Negative for visual disturbance.  Respiratory: Negative for cough and chest tightness.   Gastrointestinal: Negative for nausea, vomiting, abdominal pain and diarrhea.  Genitourinary: Negative for frequency, difficulty urinating and vaginal pain.  Musculoskeletal: Negative for back pain and gait problem.  Skin: Negative for pallor and rash.  Neurological: Negative for dizziness, tremors, weakness, numbness and headaches.  Psychiatric/Behavioral: Negative for confusion and sleep disturbance.    Objective:  BP 120/80 mmHg  Pulse 74  Temp(Src) 98 F  (36.7 C)  Ht 5\' 2"  (1.575 m)  Wt 207 lb (93.895 kg)  BMI 37.85 kg/m2  SpO2 95%  BP Readings from Last 3 Encounters:  07/03/15 120/80  05/29/15 142/80  05/21/15 132/61    Wt Readings from Last 3 Encounters:  07/03/15 207 lb (93.895 kg)  05/29/15 208 lb (94.348 kg)  05/15/15 208 lb (94.348 kg)    Physical Exam  Constitutional: She appears well-developed. No distress.  HENT:  Head: Normocephalic.  Right Ear: External ear normal.  Left Ear: External ear normal.  Nose: Nose normal.  Mouth/Throat: Oropharynx is clear and moist.  Eyes: Conjunctivae are normal. Pupils are equal, round, and reactive to light. Right eye exhibits no discharge. Left eye exhibits no discharge.  Neck: Normal range of motion. Neck supple. No JVD present. No tracheal deviation present. No thyromegaly present.  Cardiovascular: Normal rate, regular rhythm and normal heart sounds.   Pulmonary/Chest: No stridor. No respiratory distress. She has no wheezes.  Abdominal: Soft. Bowel sounds are normal. She exhibits no distension and no mass. There is no tenderness. There is no rebound and no guarding.  Musculoskeletal: She exhibits no edema.  Lymphadenopathy:    She has no cervical adenopathy.  Neurological: She displays normal reflexes. No cranial nerve deficit. She exhibits normal muscle tone. Coordination normal.  Skin: No rash noted. No erythema.  Psychiatric: She has a normal mood and affect. Her behavior is normal. Judgment and thought content normal.  Obese  Lab Results  Component Value Date   WBC 6.4 02/09/2015   HGB 13.2 02/09/2015  HCT 39.0 02/09/2015   PLT 200.0 02/09/2015   GLUCOSE 133* 05/15/2015   CHOL 183 04/21/2014   TRIG 155.0* 04/21/2014   HDL 44.00 04/21/2014   LDLCALC 108* 04/21/2014   ALT 31 05/15/2015   AST 33 05/15/2015   NA 142 05/15/2015   K 3.9 05/15/2015   CL 107 05/15/2015   CREATININE 0.71 05/15/2015   BUN 16 05/15/2015   CO2 28 05/15/2015   TSH 1.07 05/15/2015    HGBA1C 6.0 05/15/2015    Mm Digital Screening Bilateral  06/04/2015  CLINICAL DATA:  Screening. EXAM: DIGITAL SCREENING BILATERAL MAMMOGRAM WITH CAD COMPARISON:  Previous exam(s). ACR Breast Density Category b: There are scattered areas of fibroglandular density. FINDINGS: There are no findings suspicious for malignancy. Images were processed with CAD. IMPRESSION: No mammographic evidence of malignancy. A result letter of this screening mammogram will be mailed directly to the patient. RECOMMENDATION: Screening mammogram in one year. (Code:SM-B-01Y) BI-RADS CATEGORY  1: Negative. Electronically Signed   By: Fidela Salisbury M.D.   On: 06/04/2015 13:06    Assessment & Plan:   Diagnoses and all orders for this visit:  Right lower quadrant pain -     HYDROcodone-acetaminophen (NORCO) 7.5-325 MG tablet; Take 1 tablet by mouth every 8 (eight) hours as needed for severe pain.  Essential hypertension  Controlled type 2 diabetes mellitus without complication, without long-term current use of insulin (HCC)  Meralgia paraesthetica, right  Sciatic leg pain  Generalized anxiety disorder   I am having Ms. Boulais maintain her ranitidine, Vitamin D3, metoprolol succinate, ALPRAZolam, nortriptyline, and HYDROcodone-acetaminophen.  Meds ordered this encounter  Medications  . HYDROcodone-acetaminophen (NORCO) 7.5-325 MG tablet    Sig: Take 1 tablet by mouth every 8 (eight) hours as needed for severe pain.    Dispense:  90 tablet    Refill:  0     Follow-up: Return in about 2 months (around 09/03/2015) for a follow-up visit.  Walker Kehr, MD

## 2015-08-15 ENCOUNTER — Ambulatory Visit (INDEPENDENT_AMBULATORY_CARE_PROVIDER_SITE_OTHER): Payer: Medicare Other | Admitting: Internal Medicine

## 2015-08-15 ENCOUNTER — Encounter: Payer: Self-pay | Admitting: Internal Medicine

## 2015-08-15 ENCOUNTER — Other Ambulatory Visit (INDEPENDENT_AMBULATORY_CARE_PROVIDER_SITE_OTHER): Payer: Medicare Other

## 2015-08-15 VITALS — BP 118/70 | HR 77 | Wt 205.0 lb

## 2015-08-15 DIAGNOSIS — I10 Essential (primary) hypertension: Secondary | ICD-10-CM

## 2015-08-15 DIAGNOSIS — R1031 Right lower quadrant pain: Secondary | ICD-10-CM

## 2015-08-15 DIAGNOSIS — E119 Type 2 diabetes mellitus without complications: Secondary | ICD-10-CM | POA: Diagnosis not present

## 2015-08-15 LAB — URINALYSIS
Bilirubin Urine: NEGATIVE
Hgb urine dipstick: NEGATIVE
Ketones, ur: NEGATIVE
Leukocytes, UA: NEGATIVE
Nitrite: NEGATIVE
Specific Gravity, Urine: >=1.03 — AB (ref 1.000–1.030)
Total Protein, Urine: NEGATIVE
Urine Glucose: NEGATIVE
Urobilinogen, UA: 0.2 (ref 0.0–1.0)
pH: 5.5 (ref 5.0–8.0)

## 2015-08-15 LAB — CBC WITH DIFFERENTIAL/PLATELET
Basophils Absolute: 0 10*3/uL (ref 0.0–0.1)
Basophils Relative: 0.7 % (ref 0.0–3.0)
Eosinophils Absolute: 0.1 10*3/uL (ref 0.0–0.7)
Eosinophils Relative: 1.7 % (ref 0.0–5.0)
HCT: 41.6 % (ref 36.0–46.0)
Hemoglobin: 14 g/dL (ref 12.0–15.0)
Lymphocytes Relative: 38.7 % (ref 12.0–46.0)
Lymphs Abs: 2.6 10*3/uL (ref 0.7–4.0)
MCHC: 33.5 g/dL (ref 30.0–36.0)
MCV: 91.6 fl (ref 78.0–100.0)
Monocytes Absolute: 0.5 10*3/uL (ref 0.1–1.0)
Monocytes Relative: 6.8 % (ref 3.0–12.0)
Neutro Abs: 3.5 10*3/uL (ref 1.4–7.7)
Neutrophils Relative %: 52.1 % (ref 43.0–77.0)
Platelets: 222 10*3/uL (ref 150.0–400.0)
RBC: 4.54 Mil/uL (ref 3.87–5.11)
RDW: 12.9 % (ref 11.5–15.5)
WBC: 6.8 10*3/uL (ref 4.0–10.5)

## 2015-08-15 LAB — BASIC METABOLIC PANEL
BUN: 22 mg/dL (ref 6–23)
CO2: 25 mEq/L (ref 19–32)
Calcium: 9.3 mg/dL (ref 8.4–10.5)
Chloride: 106 mEq/L (ref 96–112)
Creatinine, Ser: 0.8 mg/dL (ref 0.40–1.20)
GFR: 75.97 mL/min (ref >60.00)
Glucose, Bld: 124 mg/dL — ABNORMAL HIGH (ref 70–99)
Potassium: 4 mEq/L (ref 3.5–5.1)
Sodium: 141 mEq/L (ref 135–145)

## 2015-08-15 MED ORDER — METOPROLOL SUCCINATE ER 25 MG PO TB24: 25.0000 mg | ORAL_TABLET | Freq: Every morning | ORAL | Status: DC

## 2015-08-15 MED ORDER — ALPRAZOLAM 0.5 MG PO TABS: 0.5000 mg | ORAL_TABLET | Freq: Two times a day (BID) | ORAL | Status: DC | PRN

## 2015-08-15 MED ORDER — HYDROCODONE-ACETAMINOPHEN 7.5-325 MG PO TABS: 1.0000 | ORAL_TABLET | Freq: Three times a day (TID) | ORAL | Status: DC | PRN

## 2015-08-15 NOTE — Progress Notes (Signed)
Subjective:  Patient ID: Amanda Arnold, female    DOB: Jan 12, 1946  Age: 70 y.o. MRN: [id number]  CC: No chief complaint on file.   HPI Amanda Arnold presents for anxiety, LBP, HTN f/u. C/o RLQ abd pain and R flank pain relieved w/urination off and on (x 1 year). H/o R urinary stone on 2011 abd CT  Outpatient Prescriptions Prior to Visit  Medication Sig Dispense Refill  . Cholecalciferol (VITAMIN D3) 2000 UNITS capsule Take 1 capsule (2,000 Units total) by mouth daily. 100 capsule 3  . nortriptyline (PAMELOR) 10 MG capsule Take 1 capsule (10 mg total) by mouth 3 (three) times daily as needed (burning pain). 90 capsule 3  . ranitidine (ZANTAC) 150 MG tablet Take 1 tablet (150 mg total) by mouth 2 (two) times daily. 60 tablet 0  . ALPRAZolam (XANAX) 0.5 MG tablet TAKE 1 TABLET BY MOUTH TWICE DAILY (Patient taking differently: Take 0.5 mg by mouth 2 (two) times daily as needed for anxiety. ) 60 tablet 2  . HYDROcodone-acetaminophen (NORCO) 7.5-325 MG tablet Take 1 tablet by mouth every 8 (eight) hours as needed for severe pain. 90 tablet 0  . metoprolol succinate (TOPROL-XL) 25 MG 24 hr tablet TAKE 1 TABLET BY MOUTH EVERY MORNING 90 tablet 3   No facility-administered medications prior to visit.    ROS Review of Systems  Constitutional: Negative for chills, activity change, appetite change, fatigue and unexpected weight change.  HENT: Negative for congestion, mouth sores and sinus pressure.   Eyes: Negative for visual disturbance.  Respiratory: Negative for cough and chest tightness.   Gastrointestinal: Positive for abdominal pain. Negative for nausea, blood in stool and abdominal distention.  Endocrine: Negative for polyphagia and polyuria.  Genitourinary: Negative for dysuria, urgency, frequency, difficulty urinating and vaginal pain.  Musculoskeletal: Negative for back pain and gait problem.  Skin: Negative for pallor and rash.  Neurological: Negative for dizziness, tremors,  weakness, numbness and headaches.  Psychiatric/Behavioral: Negative for confusion and sleep disturbance.    Objective:  BP 118/70 mmHg  Pulse 77  Wt 205 lb (92.987 kg)  SpO2 95%  BP Readings from Last 3 Encounters:  08/15/15 118/70  07/03/15 120/80  05/29/15 142/80    Wt Readings from Last 3 Encounters:  08/15/15 205 lb (92.987 kg)  07/03/15 207 lb (93.895 kg)  05/29/15 208 lb (94.348 kg)    Physical Exam  Constitutional: She appears well-developed. No distress.  HENT:  Head: Normocephalic.  Right Ear: External ear normal.  Left Ear: External ear normal.  Nose: Nose normal.  Mouth/Throat: Oropharynx is clear and moist.  Eyes: Conjunctivae are normal. Pupils are equal, round, and reactive to light. Right eye exhibits no discharge. Left eye exhibits no discharge.  Neck: Normal range of motion. Neck supple. No JVD present. No tracheal deviation present. No thyromegaly present.  Cardiovascular: Normal rate, regular rhythm and normal heart sounds.   Pulmonary/Chest: No stridor. No respiratory distress. She has no wheezes.  Abdominal: Soft. Bowel sounds are normal. She exhibits no distension and no mass. There is no tenderness. There is no rebound and no guarding.  Musculoskeletal: She exhibits no edema or tenderness.  Lymphadenopathy:    She has no cervical adenopathy.  Neurological: She displays normal reflexes. No cranial nerve deficit. She exhibits normal muscle tone. Coordination normal.  Skin: No rash noted. No erythema.  Psychiatric: She has a normal mood and affect. Her behavior is normal. Judgment and thought content normal.  RLQ is sensitive. No  mass, no rebound Obese LS tender  Lab Results  Component Value Date   WBC 6.4 02/09/2015   HGB 13.2 02/09/2015   HCT 39.0 02/09/2015   PLT 200.0 02/09/2015   GLUCOSE 133* 05/15/2015   CHOL 183 04/21/2014   TRIG 155.0* 04/21/2014   HDL 44.00 04/21/2014   LDLCALC 108* 04/21/2014   ALT 31 05/15/2015   AST 33  05/15/2015   NA 142 05/15/2015   K 3.9 05/15/2015   CL 107 05/15/2015   CREATININE 0.71 05/15/2015   BUN 16 05/15/2015   CO2 28 05/15/2015   TSH 1.07 05/15/2015   HGBA1C 6.0 05/15/2015    Mm Digital Screening Bilateral  06/04/2015  CLINICAL DATA:  Screening. EXAM: DIGITAL SCREENING BILATERAL MAMMOGRAM WITH CAD COMPARISON:  Previous exam(s). ACR Breast Density Category b: There are scattered areas of fibroglandular density. FINDINGS: There are no findings suspicious for malignancy. Images were processed with CAD. IMPRESSION: No mammographic evidence of malignancy. A result letter of this screening mammogram will be mailed directly to the patient. RECOMMENDATION: Screening mammogram in one year. (Code:SM-B-01Y) BI-RADS CATEGORY  1: Negative. Electronically Signed   By: Fidela Salisbury M.D.   On: 06/04/2015 13:06    Assessment & Plan:   Diagnoses and all orders for this visit:  Right lower quadrant pain -     ALPRAZolam (XANAX) 0.5 MG tablet; Take 1 tablet (0.5 mg total) by mouth 2 (two) times daily as needed for anxiety. -     HYDROcodone-acetaminophen (NORCO) 7.5-325 MG tablet; Take 1 tablet by mouth every 8 (eight) hours as needed for severe pain. -     CBC with Differential/Platelet; Future -     Basic metabolic panel; Future -     Urinalysis; Future -     CT Abdomen Pelvis Wo Contrast; Future  Controlled type 2 diabetes mellitus without complication, without long-term current use of insulin (HCC) -     ALPRAZolam (XANAX) 0.5 MG tablet; Take 1 tablet (0.5 mg total) by mouth 2 (two) times daily as needed for anxiety. -     CBC with Differential/Platelet; Future -     Basic metabolic panel; Future -     Urinalysis; Future  Essential hypertension -     CBC with Differential/Platelet; Future -     Basic metabolic panel; Future -     Urinalysis; Future  RLQ abdominal pain -     CBC with Differential/Platelet; Future -     Basic metabolic panel; Future -     Urinalysis;  Future  Other orders -     metoprolol succinate (TOPROL-XL) 25 MG 24 hr tablet; Take 1 tablet (25 mg total) by mouth every morning.   I have changed Amanda Arnold's ALPRAZolam and metoprolol succinate. I am also having her maintain her ranitidine, Vitamin D3, nortriptyline, and HYDROcodone-acetaminophen.  Meds ordered this encounter  Medications  . ALPRAZolam (XANAX) 0.5 MG tablet    Sig: Take 1 tablet (0.5 mg total) by mouth 2 (two) times daily as needed for anxiety.    Dispense:  60 tablet    Refill:  2  . HYDROcodone-acetaminophen (NORCO) 7.5-325 MG tablet    Sig: Take 1 tablet by mouth every 8 (eight) hours as needed for severe pain.    Dispense:  90 tablet    Refill:  0  . metoprolol succinate (TOPROL-XL) 25 MG 24 hr tablet    Sig: Take 1 tablet (25 mg total) by mouth every morning.    Dispense:  90  tablet    Refill:  3     Follow-up: Return in about 4 weeks (around 09/12/2015) for a follow-up visit.  Walker Kehr, MD

## 2015-08-15 NOTE — Progress Notes (Signed)
Pre visit review using our clinic review tool, if applicable. No additional management support is needed unless otherwise documented below in the visit note. 

## 2015-08-15 NOTE — Assessment & Plan Note (Signed)
2/17 c/o RLQ abd pain relieved w/urination  (x 1 year) - ?etiology. H/o R urinary stone on 2011 abd CT Renal CT Labs

## 2015-08-15 NOTE — Assessment & Plan Note (Signed)
On Toprol XL

## 2015-08-22 ENCOUNTER — Ambulatory Visit (INDEPENDENT_AMBULATORY_CARE_PROVIDER_SITE_OTHER)
Admission: RE | Admit: 2015-08-22 | Discharge: 2015-08-22 | Disposition: A | Discharge status: Home / Self Care | Payer: Medicare Other | Source: Ambulatory Visit | Attending: Internal Medicine | Admitting: Internal Medicine

## 2015-08-22 DIAGNOSIS — R1031 Right lower quadrant pain: Secondary | ICD-10-CM | POA: Diagnosis not present

## 2015-08-22 DIAGNOSIS — N200 Calculus of kidney: Secondary | ICD-10-CM | POA: Diagnosis not present

## 2015-08-30 DIAGNOSIS — N898 Other specified noninflammatory disorders of vagina: Secondary | ICD-10-CM | POA: Diagnosis not present

## 2015-08-30 DIAGNOSIS — L292 Pruritus vulvae: Secondary | ICD-10-CM | POA: Diagnosis not present

## 2015-09-13 ENCOUNTER — Ambulatory Visit: Payer: Medicare Other | Admitting: Internal Medicine

## 2015-10-04 ENCOUNTER — Ambulatory Visit: Payer: Medicare Other | Admitting: Internal Medicine

## 2015-10-19 ENCOUNTER — Encounter: Payer: Self-pay | Admitting: Internal Medicine

## 2015-10-19 ENCOUNTER — Other Ambulatory Visit (INDEPENDENT_AMBULATORY_CARE_PROVIDER_SITE_OTHER): Payer: Medicare Other

## 2015-10-19 ENCOUNTER — Ambulatory Visit (INDEPENDENT_AMBULATORY_CARE_PROVIDER_SITE_OTHER): Payer: Medicare Other | Admitting: Internal Medicine

## 2015-10-19 VITALS — BP 112/74 | HR 68 | Temp 97.8°F | Resp 18 | Ht 62.0 in | Wt 206.0 lb

## 2015-10-19 DIAGNOSIS — M545 Low back pain: Secondary | ICD-10-CM

## 2015-10-19 DIAGNOSIS — R1031 Right lower quadrant pain: Secondary | ICD-10-CM

## 2015-10-19 DIAGNOSIS — B372 Candidiasis of skin and nail: Secondary | ICD-10-CM

## 2015-10-19 DIAGNOSIS — I10 Essential (primary) hypertension: Secondary | ICD-10-CM

## 2015-10-19 DIAGNOSIS — E119 Type 2 diabetes mellitus without complications: Secondary | ICD-10-CM

## 2015-10-19 DIAGNOSIS — R21 Rash and other nonspecific skin eruption: Secondary | ICD-10-CM | POA: Diagnosis not present

## 2015-10-19 LAB — BASIC METABOLIC PANEL
BUN: 24 mg/dL — ABNORMAL HIGH (ref 6–23)
CO2: 28 mEq/L (ref 19–32)
Calcium: 9.3 mg/dL (ref 8.4–10.5)
Chloride: 107 mEq/L (ref 96–112)
Creatinine, Ser: 0.75 mg/dL (ref 0.40–1.20)
GFR: 81.8 mL/min (ref >60.00)
Glucose, Bld: 124 mg/dL — ABNORMAL HIGH (ref 70–99)
Potassium: 4.4 mEq/L (ref 3.5–5.1)
Sodium: 140 mEq/L (ref 135–145)

## 2015-10-19 LAB — HEMOGLOBIN A1C: Hgb A1c MFr Bld: 6.1 % (ref 4.6–6.5)

## 2015-10-19 MED ORDER — METOPROLOL SUCCINATE ER 25 MG PO TB24: 25.0000 mg | ORAL_TABLET | Freq: Every morning | ORAL | Status: DC

## 2015-10-19 MED ORDER — KETOCONAZOLE 2 % EX CREA: 1.0000 "application " | TOPICAL_CREAM | Freq: Two times a day (BID) | CUTANEOUS | Status: DC

## 2015-10-19 MED ORDER — HYDROCODONE-ACETAMINOPHEN 7.5-325 MG PO TABS: 1.0000 | ORAL_TABLET | Freq: Three times a day (TID) | ORAL | Status: DC | PRN

## 2015-10-19 MED ORDER — KETOCONAZOLE 200 MG PO TABS: 200.0000 mg | ORAL_TABLET | Freq: Two times a day (BID) | ORAL | Status: DC

## 2015-10-19 NOTE — Progress Notes (Signed)
Pre visit review using our clinic review tool, if applicable. No additional management support is needed unless otherwise documented below in the visit note. 

## 2015-10-19 NOTE — Assessment & Plan Note (Signed)
4/17 intertrigo - worse Ketoconazole po and topically - see rx

## 2015-10-19 NOTE — Progress Notes (Signed)
Subjective:  Patient ID: Amanda Arnold, female    DOB: 07-Mar-1946  Age: 70 y.o. MRN: [id number]  CC: No chief complaint on file.   HPI HEALY FURTAK presents for DM2, HTN, LBP f/u. C/o rash in the groin - worse. F/u RLQ abd pain - no change  Outpatient Prescriptions Prior to Visit  Medication Sig Dispense Refill  . Cholecalciferol (VITAMIN D3) 2000 UNITS capsule Take 1 capsule (2,000 Units total) by mouth daily. 100 capsule 3  . HYDROcodone-acetaminophen (NORCO) 7.5-325 MG tablet Take 1 tablet by mouth every 8 (eight) hours as needed for severe pain. 90 tablet 0  . metoprolol succinate (TOPROL-XL) 25 MG 24 hr tablet Take 1 tablet (25 mg total) by mouth every morning. 90 tablet 3  . ranitidine (ZANTAC) 150 MG tablet Take 1 tablet (150 mg total) by mouth 2 (two) times daily. 60 tablet 0  . ALPRAZolam (XANAX) 0.5 MG tablet Take 1 tablet (0.5 mg total) by mouth 2 (two) times daily as needed for anxiety. (Patient not taking: Reported on 10/19/2015) 60 tablet 2  . nortriptyline (PAMELOR) 10 MG capsule Take 1 capsule (10 mg total) by mouth 3 (three) times daily as needed (burning pain). (Patient not taking: Reported on 10/19/2015) 90 capsule 3   No facility-administered medications prior to visit.    ROS Review of Systems  Constitutional: Negative for chills, activity change, appetite change, fatigue and unexpected weight change.  HENT: Negative for congestion, mouth sores and sinus pressure.   Eyes: Negative for visual disturbance.  Respiratory: Negative for cough and chest tightness.   Gastrointestinal: Positive for abdominal pain. Negative for nausea and abdominal distention.  Genitourinary: Negative for frequency, difficulty urinating and vaginal pain.  Musculoskeletal: Positive for back pain and arthralgias. Negative for gait problem.  Skin: Positive for rash. Negative for pallor.  Neurological: Negative for dizziness, tremors, weakness, numbness and headaches.    Psychiatric/Behavioral: Negative for suicidal ideas, confusion and sleep disturbance. The patient is nervous/anxious.     Objective:  BP 112/74 mmHg  Pulse 68  Temp(Src) 97.8 F (36.6 C) (Oral)  Resp 18  Ht 5\' 2"  (1.575 m)  Wt 206 lb (93.441 kg)  BMI 37.67 kg/m2  SpO2 93%  BP Readings from Last 3 Encounters:  10/19/15 112/74  08/15/15 118/70  07/03/15 120/80    Wt Readings from Last 3 Encounters:  10/19/15 206 lb (93.441 kg)  08/15/15 205 lb (92.987 kg)  07/03/15 207 lb (93.895 kg)    Physical Exam  Constitutional: She appears well-developed. No distress.  HENT:  Head: Normocephalic.  Right Ear: External ear normal.  Left Ear: External ear normal.  Nose: Nose normal.  Mouth/Throat: Oropharynx is clear and moist.  Eyes: Conjunctivae are normal. Pupils are equal, round, and reactive to light. Right eye exhibits no discharge. Left eye exhibits no discharge.  Neck: Normal range of motion. Neck supple. No JVD present. No tracheal deviation present. No thyromegaly present.  Cardiovascular: Normal rate, regular rhythm and normal heart sounds.   Pulmonary/Chest: No stridor. No respiratory distress. She has no wheezes.  Abdominal: Soft. Bowel sounds are normal. She exhibits no distension and no mass. There is no tenderness. There is no rebound and no guarding.  Musculoskeletal: She exhibits no edema or tenderness.  Lymphadenopathy:    She has no cervical adenopathy.  Neurological: She displays normal reflexes. No cranial nerve deficit. She exhibits normal muscle tone. Coordination normal.  Skin: Rash noted. No erythema.  Psychiatric: She has a normal mood  and affect. Her behavior is normal. Judgment and thought content normal.  Obese Large stomach w/intertrigo in the folds LS tender  Lab Results  Component Value Date   WBC 6.8 08/15/2015   HGB 14.0 08/15/2015   HCT 41.6 08/15/2015   PLT 222.0 08/15/2015   GLUCOSE 124* 08/15/2015   CHOL 183 04/21/2014   TRIG 155.0*  04/21/2014   HDL 44.00 04/21/2014   LDLCALC 108* 04/21/2014   ALT 31 05/15/2015   AST 33 05/15/2015   NA 141 08/15/2015   K 4.0 08/15/2015   CL 106 08/15/2015   CREATININE 0.80 08/15/2015   BUN 22 08/15/2015   CO2 25 08/15/2015   TSH 1.07 05/15/2015   HGBA1C 6.0 05/15/2015    Ct Renal Stone Study  08/22/2015  CLINICAL DATA:  Right lower quadrant abdominal pain radiating to the right flank. Recent hematuria. EXAM: CT ABDOMEN AND PELVIS WITHOUT CONTRAST TECHNIQUE: Multidetector CT imaging of the abdomen and pelvis was performed following the standard protocol without IV contrast. COMPARISON:  Multiple exams, including 12/21/2009 FINDINGS: Lower chest: Minimal lower lingular scarring and dependent subsegmental atelectasis in the lower lobes. Mild scarring in the right middle lobe medially. Mild cardiomegaly. Hepatobiliary: Cholecystectomy. Pancreas: Unremarkable Spleen: Unremarkable Adrenals/Urinary Tract: 2.9 by 1.8 cm left adrenal mass, precontrast density 1 Hounsfield unit, compatible with adenoma. 1.3 by 1.4 cm right adrenal mass, precontrast density 8 Hounsfield units, compatible with adenoma. 5 mm right kidney lower pole nonobstructive calculus. Stomach/Bowel: Unremarkable Vascular/lymphatic: Aortoiliac atherosclerotic vascular disease. Reproductive: 2.1 cm calcified left fundal fibroid. Other: No supplemental non-categorized findings. Musculoskeletal: Multilevel lumbar degenerative disc disease causing multilevel lumbar impingement including relatively mild degrees of foraminal stenosis and central narrowing of the thecal sac at all levels between L2 and S1. Degenerative endplate sclerosis at 075-GRM especially eccentric to the left IMPRESSION: 1. 5 mm nonobstructive right kidney lower pole calculus. 2. Bilateral adrenal adenomas. 3. Mild bibasilar scarring and mild cardiomegaly. 4. Lumbar spondylosis and degenerative disc disease causing multilevel relatively mild impingement in the lumbar spine.  5.  Aortoiliac atherosclerotic vascular disease. 6. 2.1 cm calcified left fundal uterine fibroid. Electronically Signed   By: Van Clines M.D.   On: 08/22/2015 15:56    Assessment & Plan:   Diagnoses and all orders for this visit:  Right lower quadrant pain -     HYDROcodone-acetaminophen (NORCO) 7.5-325 MG tablet; Take 1 tablet by mouth every 8 (eight) hours as needed for severe pain.  Other orders -     metoprolol succinate (TOPROL-XL) 25 MG 24 hr tablet; Take 1 tablet (25 mg total) by mouth every morning.   I am having Ms. Talford maintain her ranitidine, Vitamin D3, nortriptyline, ALPRAZolam, HYDROcodone-acetaminophen, metoprolol succinate, nystatin cream, and triamcinolone cream.  Meds ordered this encounter  Medications  . nystatin cream (MYCOSTATIN)    Sig:   . triamcinolone cream (KENALOG) 0.1 %    Sig:      Follow-up: No Follow-up on file.  Walker Kehr, MD

## 2015-10-19 NOTE — Assessment & Plan Note (Signed)
Norco prn  Potential benefits of a long term opioids use as well as potential risks (i.e. addiction risk, apnea etc) and complications (i.e. Somnolence, constipation and others) were explained to the patient and were aknowledged. 

## 2015-10-19 NOTE — Assessment & Plan Note (Signed)
4/17 groin severe: ketoconazole po and topically - Rx given. Correct CBGs

## 2015-10-19 NOTE — Assessment & Plan Note (Signed)
Toprol XL NAS diet

## 2015-10-19 NOTE — Assessment & Plan Note (Signed)
On diet Risks associated with diet noncompliance were discussed. Compliance was encouraged. 

## 2015-10-23 DIAGNOSIS — B372 Candidiasis of skin and nail: Secondary | ICD-10-CM | POA: Diagnosis not present

## 2015-11-09 ENCOUNTER — Ambulatory Visit (INDEPENDENT_AMBULATORY_CARE_PROVIDER_SITE_OTHER): Payer: Medicare Other | Admitting: Internal Medicine

## 2015-11-09 ENCOUNTER — Encounter: Payer: Self-pay | Admitting: Internal Medicine

## 2015-11-09 VITALS — BP 140/72 | HR 71 | Temp 98.1°F | Wt 204.0 lb

## 2015-11-09 DIAGNOSIS — J441 Chronic obstructive pulmonary disease with (acute) exacerbation: Secondary | ICD-10-CM

## 2015-11-09 DIAGNOSIS — J209 Acute bronchitis, unspecified: Secondary | ICD-10-CM

## 2015-11-09 DIAGNOSIS — I10 Essential (primary) hypertension: Secondary | ICD-10-CM | POA: Diagnosis not present

## 2015-11-09 MED ORDER — METHYLPREDNISOLONE ACETATE 80 MG/ML IJ SUSP
80.0000 mg | Freq: Once | INTRAMUSCULAR | Status: AC
  Administered 2015-11-09: 80 mg via INTRAMUSCULAR

## 2015-11-09 MED ORDER — LEVOFLOXACIN 500 MG PO TABS: 500.0000 mg | ORAL_TABLET | Freq: Every day | ORAL | Status: DC

## 2015-11-09 MED ORDER — PROMETHAZINE-CODEINE 6.25-10 MG/5ML PO SYRP: 5.0000 mL | ORAL_SOLUTION | ORAL | Status: DC | PRN

## 2015-11-09 MED ORDER — UMECLIDINIUM-VILANTEROL 62.5-25 MCG/INH IN AEPB: 1.0000 | INHALATION_SPRAY | Freq: Every day | RESPIRATORY_TRACT | Status: DC

## 2015-11-09 NOTE — Assessment & Plan Note (Signed)
Depo-medrol 80 mg IM 

## 2015-11-09 NOTE — Assessment & Plan Note (Signed)
Toprol XL 

## 2015-11-09 NOTE — Assessment & Plan Note (Addendum)
Levaquin Prom-cod - can take Codeine ok

## 2015-11-09 NOTE — Progress Notes (Signed)
Subjective:  Patient ID: Amanda Arnold, female    DOB: Jul 21, 1945  Age: 70 y.o. MRN: [id number]  CC: No chief complaint on file.   HPI Amanda Arnold presents for URI sx's and wheezing x 1 week - not better  Outpatient Prescriptions Prior to Visit  Medication Sig Dispense Refill  . ALPRAZolam (XANAX) 0.5 MG tablet Take 1 tablet (0.5 mg total) by mouth 2 (two) times daily as needed for anxiety. 60 tablet 2  . Cholecalciferol (VITAMIN D3) 2000 UNITS capsule Take 1 capsule (2,000 Units total) by mouth daily. 100 capsule 3  . HYDROcodone-acetaminophen (NORCO) 7.5-325 MG tablet Take 1 tablet by mouth every 8 (eight) hours as needed for severe pain. 90 tablet 0  . ketoconazole (NIZORAL) 2 % cream Apply 1 application topically 2 (two) times daily. In the groin 60 g 1  . ketoconazole (NIZORAL) 200 MG tablet Take 1 tablet (200 mg total) by mouth 2 (two) times daily. 42 tablet 1  . metoprolol succinate (TOPROL-XL) 25 MG 24 hr tablet Take 1 tablet (25 mg total) by mouth every morning. 90 tablet 3  . nortriptyline (PAMELOR) 10 MG capsule Take 1 capsule (10 mg total) by mouth 3 (three) times daily as needed (burning pain). 90 capsule 3  . ranitidine (ZANTAC) 150 MG tablet Take 1 tablet (150 mg total) by mouth 2 (two) times daily. 60 tablet 0  . triamcinolone cream (KENALOG) 0.1 %      No facility-administered medications prior to visit.    ROS Review of Systems  Constitutional: Negative for chills, activity change, appetite change, fatigue and unexpected weight change.  HENT: Positive for congestion, rhinorrhea and sinus pressure. Negative for mouth sores.   Eyes: Negative for visual disturbance.  Respiratory: Positive for cough and wheezing. Negative for chest tightness.   Gastrointestinal: Negative for nausea and abdominal pain.  Genitourinary: Negative for frequency, difficulty urinating and vaginal pain.  Musculoskeletal: Negative for back pain and gait problem.  Skin: Negative for  pallor and rash.  Neurological: Negative for dizziness, tremors, weakness, numbness and headaches.  Psychiatric/Behavioral: Negative for confusion and sleep disturbance.    Objective:  BP 140/72 mmHg  Pulse 71  Temp(Src) 98.1 F (36.7 C) (Oral)  Wt 204 lb (92.534 kg)  SpO2 96%  BP Readings from Last 3 Encounters:  11/09/15 140/72  10/19/15 112/74  08/15/15 118/70    Wt Readings from Last 3 Encounters:  11/09/15 204 lb (92.534 kg)  10/19/15 206 lb (93.441 kg)  08/15/15 205 lb (92.987 kg)    Physical Exam  Constitutional: She appears well-developed. No distress.  HENT:  Head: Normocephalic.  Right Ear: External ear normal.  Left Ear: External ear normal.  Nose: Nose normal.  Mouth/Throat: Oropharynx is clear and moist.  Eyes: Conjunctivae are normal. Pupils are equal, round, and reactive to light. Right eye exhibits no discharge. Left eye exhibits no discharge.  Neck: Normal range of motion. Neck supple. No JVD present. No tracheal deviation present. No thyromegaly present.  Cardiovascular: Normal rate, regular rhythm and normal heart sounds.   Pulmonary/Chest: No stridor. No respiratory distress. She has wheezes.  Abdominal: Soft. Bowel sounds are normal. She exhibits no distension and no mass. There is no tenderness. There is no rebound and no guarding.  Musculoskeletal: She exhibits no edema or tenderness.  Lymphadenopathy:    She has no cervical adenopathy.  Neurological: She displays normal reflexes. No cranial nerve deficit. She exhibits normal muscle tone. Coordination normal.  Skin: No rash  noted. No erythema.  Psychiatric: She has a normal mood and affect. Her behavior is normal. Judgment and thought content normal.  eryth throat  Lab Results  Component Value Date   WBC 6.8 08/15/2015   HGB 14.0 08/15/2015   HCT 41.6 08/15/2015   PLT 222.0 08/15/2015   GLUCOSE 124* 10/19/2015   CHOL 183 04/21/2014   TRIG 155.0* 04/21/2014   HDL 44.00 04/21/2014   LDLCALC  108* 04/21/2014   ALT 31 05/15/2015   AST 33 05/15/2015   NA 140 10/19/2015   K 4.4 10/19/2015   CL 107 10/19/2015   CREATININE 0.75 10/19/2015   BUN 24* 10/19/2015   CO2 28 10/19/2015   TSH 1.07 05/15/2015   HGBA1C 6.1 10/19/2015    Ct Renal Stone Study  08/22/2015  CLINICAL DATA:  Right lower quadrant abdominal pain radiating to the right flank. Recent hematuria. EXAM: CT ABDOMEN AND PELVIS WITHOUT CONTRAST TECHNIQUE: Multidetector CT imaging of the abdomen and pelvis was performed following the standard protocol without IV contrast. COMPARISON:  Multiple exams, including 12/21/2009 FINDINGS: Lower chest: Minimal lower lingular scarring and dependent subsegmental atelectasis in the lower lobes. Mild scarring in the right middle lobe medially. Mild cardiomegaly. Hepatobiliary: Cholecystectomy. Pancreas: Unremarkable Spleen: Unremarkable Adrenals/Urinary Tract: 2.9 by 1.8 cm left adrenal mass, precontrast density 1 Hounsfield unit, compatible with adenoma. 1.3 by 1.4 cm right adrenal mass, precontrast density 8 Hounsfield units, compatible with adenoma. 5 mm right kidney lower pole nonobstructive calculus. Stomach/Bowel: Unremarkable Vascular/lymphatic: Aortoiliac atherosclerotic vascular disease. Reproductive: 2.1 cm calcified left fundal fibroid. Other: No supplemental non-categorized findings. Musculoskeletal: Multilevel lumbar degenerative disc disease causing multilevel lumbar impingement including relatively mild degrees of foraminal stenosis and central narrowing of the thecal sac at all levels between L2 and S1. Degenerative endplate sclerosis at 075-GRM especially eccentric to the left IMPRESSION: 1. 5 mm nonobstructive right kidney lower pole calculus. 2. Bilateral adrenal adenomas. 3. Mild bibasilar scarring and mild cardiomegaly. 4. Lumbar spondylosis and degenerative disc disease causing multilevel relatively mild impingement in the lumbar spine. 5.  Aortoiliac atherosclerotic vascular  disease. 6. 2.1 cm calcified left fundal uterine fibroid. Electronically Signed   By: Van Clines M.D.   On: 08/22/2015 15:56   I personally provided Anoro inhaler use teaching. After the teaching patient was able to demonstrate it's use effectively. All questions were answered  Assessment & Plan:   There are no diagnoses linked to this encounter. I am having Ms. Kall maintain her ranitidine, Vitamin D3, nortriptyline, ALPRAZolam, triamcinolone cream, metoprolol succinate, HYDROcodone-acetaminophen, ketoconazole, and ketoconazole.  No orders of the defined types were placed in this encounter.     Follow-up: No Follow-up on file.  Walker Kehr, MD

## 2015-11-09 NOTE — Progress Notes (Signed)
Pre visit review using our clinic review tool, if applicable. No additional management support is needed unless otherwise documented below in the visit note. 

## 2015-12-05 ENCOUNTER — Ambulatory Visit (INDEPENDENT_AMBULATORY_CARE_PROVIDER_SITE_OTHER): Payer: Medicare Other | Admitting: Internal Medicine

## 2015-12-05 ENCOUNTER — Encounter: Payer: Self-pay | Admitting: Internal Medicine

## 2015-12-05 VITALS — BP 120/58 | HR 75 | Temp 98.0°F | Ht 62.0 in | Wt 203.0 lb

## 2015-12-05 DIAGNOSIS — I10 Essential (primary) hypertension: Secondary | ICD-10-CM

## 2015-12-05 DIAGNOSIS — M545 Low back pain: Secondary | ICD-10-CM | POA: Diagnosis not present

## 2015-12-05 DIAGNOSIS — R42 Dizziness and giddiness: Secondary | ICD-10-CM | POA: Diagnosis not present

## 2015-12-05 MED ORDER — KETOCONAZOLE 2 % EX CREA: 1.0000 "application " | TOPICAL_CREAM | Freq: Two times a day (BID) | CUTANEOUS | Status: DC

## 2015-12-05 MED ORDER — METHYLPREDNISOLONE ACETATE 80 MG/ML IJ SUSP
80.0000 mg | Freq: Once | INTRAMUSCULAR | Status: AC
  Administered 2015-12-05: 80 mg via INTRAMUSCULAR

## 2015-12-05 MED ORDER — MECLIZINE HCL 12.5 MG PO TABS: 12.5000 mg | ORAL_TABLET | Freq: Three times a day (TID) | ORAL | Status: DC | PRN

## 2015-12-05 NOTE — Progress Notes (Signed)
Pre visit review using our clinic review tool, if applicable. No additional management support is needed unless otherwise documented below in the visit note. 

## 2015-12-05 NOTE — Assessment & Plan Note (Signed)
Benign Positional Vertigo symptoms on the R>>L  Start Meclizine. Start Laruth Bouchard [employer] Dar[employer] exercise several times a day as dirrected.

## 2015-12-05 NOTE — Progress Notes (Signed)
Subjective:  Patient ID: Amanda Arnold, female    DOB: [birth year]  Age: 70 y.o. MRN: [id number]  CC: No chief complaint on file.   HPI Amanda Arnold presents for LBP f/u C/o vertigo w/head turning  Outpatient Prescriptions Prior to Visit  Medication Sig Dispense Refill  . ALPRAZolam (XANAX) 0.5 MG tablet Take 1 tablet (0.5 mg total) by mouth 2 (two) times daily as needed for anxiety. 60 tablet 2  . Cholecalciferol (VITAMIN D3) 2000 UNITS capsule Take 1 capsule (2,000 Units total) by mouth daily. 100 capsule 3  . HYDROcodone-acetaminophen (NORCO) 7.5-325 MG tablet Take 1 tablet by mouth every 8 (eight) hours as needed for severe pain. 90 tablet 0  . metoprolol succinate (TOPROL-XL) 25 MG 24 hr tablet Take 1 tablet (25 mg total) by mouth every morning. 90 tablet 3  . ketoconazole (NIZORAL) 2 % cream Apply 1 application topically 2 (two) times daily. In the groin (Patient not taking: Reported on 12/05/2015) 60 g 1  . umeclidinium-vilanterol (ANORO ELLIPTA) 62.5-25 MCG/INH AEPB Inhale 1 puff into the lungs daily. (Patient not taking: Reported on 12/05/2015) 1 each 11  . ketoconazole (NIZORAL) 200 MG tablet Take 1 tablet (200 mg total) by mouth 2 (two) times daily. (Patient not taking: Reported on 12/05/2015) 42 tablet 1  . levofloxacin (LEVAQUIN) 500 MG tablet Take 1 tablet (500 mg total) by mouth daily. 7 tablet 0  . nortriptyline (PAMELOR) 10 MG capsule Take 1 capsule (10 mg total) by mouth 3 (three) times daily as needed (burning pain). 90 capsule 3  . promethazine-codeine (PHENERGAN WITH CODEINE) 6.25-10 MG/5ML syrup Take 5 mLs by mouth every 4 (four) hours as needed. 300 mL 0  . ranitidine (ZANTAC) 150 MG tablet Take 1 tablet (150 mg total) by mouth 2 (two) times daily. 60 tablet 0  . triamcinolone cream (KENALOG) 0.1 %      No facility-administered medications prior to visit.    ROS Review of Systems  Constitutional: Negative for chills, activity change, appetite change, fatigue  and unexpected weight change.  HENT: Negative for congestion, mouth sores and sinus pressure.   Eyes: Negative for visual disturbance.  Respiratory: Negative for cough and chest tightness.   Gastrointestinal: Negative for nausea and abdominal pain.  Genitourinary: Negative for frequency, difficulty urinating and vaginal pain.  Musculoskeletal: Positive for back pain and gait problem.  Skin: Negative for pallor and rash.  Neurological: Positive for dizziness. Negative for tremors, weakness, numbness and headaches.  Psychiatric/Behavioral: Negative for confusion and sleep disturbance.    Objective:  BP 120/58 mmHg  Pulse 75  Temp(Src) 98 F (36.7 C) (Oral)  Ht 5\' 2"  (1.575 m)  Wt 203 lb (92.08 kg)  BMI 37.12 kg/m2  SpO2 96%  BP Readings from Last 3 Encounters:  12/05/15 120/58  11/09/15 140/72  10/19/15 112/74    Wt Readings from Last 3 Encounters:  12/05/15 203 lb (92.08 kg)  11/09/15 204 lb (92.534 kg)  10/19/15 206 lb (93.441 kg)    Physical Exam  Constitutional: She appears well-developed. No distress.  HENT:  Head: Normocephalic.  Right Ear: External ear normal.  Left Ear: External ear normal.  Nose: Nose normal.  Mouth/Throat: Oropharynx is clear and moist.  Eyes: Conjunctivae are normal. Pupils are equal, round, and reactive to light. Right eye exhibits no discharge. Left eye exhibits no discharge.  Neck: Normal range of motion. Neck supple. No JVD present. No tracheal deviation present. No thyromegaly present.  Cardiovascular: Normal rate, regular  rhythm and normal heart sounds.   Pulmonary/Chest: No stridor. No respiratory distress. She has no wheezes.  Abdominal: Soft. Bowel sounds are normal. She exhibits no distension and no mass. There is no tenderness. There is no rebound and no guarding.  Musculoskeletal: She exhibits tenderness. She exhibits no edema.  Lymphadenopathy:    She has no cervical adenopathy.  Neurological: She displays normal reflexes. No  cranial nerve deficit. She exhibits normal muscle tone. Coordination normal.  Skin: No rash noted. No erythema.  Psychiatric: She has a normal mood and affect. Her behavior is normal. Judgment and thought content normal.  Obese H-P is (+) on R>L  Lab Results  Component Value Date   WBC 6.8 08/15/2015   HGB 14.0 08/15/2015   HCT 41.6 08/15/2015   PLT 222.0 08/15/2015   GLUCOSE 124* 10/19/2015   CHOL 183 04/21/2014   TRIG 155.0* 04/21/2014   HDL 44.00 04/21/2014   LDLCALC 108* 04/21/2014   ALT 31 05/15/2015   AST 33 05/15/2015   NA 140 10/19/2015   K 4.4 10/19/2015   CL 107 10/19/2015   CREATININE 0.75 10/19/2015   BUN 24* 10/19/2015   CO2 28 10/19/2015   TSH 1.07 05/15/2015   HGBA1C 6.1 10/19/2015    Ct Renal Stone Study  08/22/2015  CLINICAL DATA:  Right lower quadrant abdominal pain radiating to the right flank. Recent hematuria. EXAM: CT ABDOMEN AND PELVIS WITHOUT CONTRAST TECHNIQUE: Multidetector CT imaging of the abdomen and pelvis was performed following the standard protocol without IV contrast. COMPARISON:  Multiple exams, including 12/21/2009 FINDINGS: Lower chest: Minimal lower lingular scarring and dependent subsegmental atelectasis in the lower lobes. Mild scarring in the right middle lobe medially. Mild cardiomegaly. Hepatobiliary: Cholecystectomy. Pancreas: Unremarkable Spleen: Unremarkable Adrenals/Urinary Tract: 2.9 by 1.8 cm left adrenal mass, precontrast density 1 Hounsfield unit, compatible with adenoma. 1.3 by 1.4 cm right adrenal mass, precontrast density 8 Hounsfield units, compatible with adenoma. 5 mm right kidney lower pole nonobstructive calculus. Stomach/Bowel: Unremarkable Vascular/lymphatic: Aortoiliac atherosclerotic vascular disease. Reproductive: 2.1 cm calcified left fundal fibroid. Other: No supplemental non-categorized findings. Musculoskeletal: Multilevel lumbar degenerative disc disease causing multilevel lumbar impingement including relatively mild  degrees of foraminal stenosis and central narrowing of the thecal sac at all levels between L2 and S1. Degenerative endplate sclerosis at 075-GRM especially eccentric to the left IMPRESSION: 1. 5 mm nonobstructive right kidney lower pole calculus. 2. Bilateral adrenal adenomas. 3. Mild bibasilar scarring and mild cardiomegaly. 4. Lumbar spondylosis and degenerative disc disease causing multilevel relatively mild impingement in the lumbar spine. 5.  Aortoiliac atherosclerotic vascular disease. 6. 2.1 cm calcified left fundal uterine fibroid. Electronically Signed   By: Van Clines M.D.   On: 08/22/2015 15:56    Assessment & Plan:   There are no diagnoses linked to this encounter. I have discontinued Ms. Brecht's ranitidine, nortriptyline, triamcinolone cream, levofloxacin, and promethazine-codeine. I am also having her maintain her Vitamin D3, ALPRAZolam, metoprolol succinate, HYDROcodone-acetaminophen, ketoconazole, and umeclidinium-vilanterol.  No orders of the defined types were placed in this encounter.     Follow-up: No Follow-up on file.  Walker Kehr, MD

## 2015-12-05 NOTE — Patient Instructions (Signed)
Benign Positional Vertigo symptoms on the R>>L  Start Meclizine. Start [employer] exercise several times a day as dirrected.

## 2015-12-05 NOTE — Assessment & Plan Note (Signed)
Toprol XL 

## 2015-12-05 NOTE — Assessment & Plan Note (Signed)
Norco prn  Potential benefits of a long term opioids use as well as potential risks (i.e. addiction risk, apnea etc) and complications (i.e. Somnolence, constipation and others) were explained to the patient and were aknowledged. 

## 2015-12-14 ENCOUNTER — Ambulatory Visit: Payer: Medicare Other | Admitting: Internal Medicine

## 2016-01-02 DIAGNOSIS — E119 Type 2 diabetes mellitus without complications: Secondary | ICD-10-CM | POA: Diagnosis not present

## 2016-01-02 DIAGNOSIS — H52203 Unspecified astigmatism, bilateral: Secondary | ICD-10-CM | POA: Diagnosis not present

## 2016-01-02 DIAGNOSIS — H04123 Dry eye syndrome of bilateral lacrimal glands: Secondary | ICD-10-CM | POA: Diagnosis not present

## 2016-01-02 DIAGNOSIS — H01001 Unspecified blepharitis right upper eyelid: Secondary | ICD-10-CM | POA: Diagnosis not present

## 2016-01-02 LAB — HM DIABETES EYE EXAM

## 2016-01-03 ENCOUNTER — Encounter: Payer: Self-pay | Admitting: Internal Medicine

## 2016-01-28 ENCOUNTER — Encounter: Payer: Self-pay | Admitting: Internal Medicine

## 2016-01-28 ENCOUNTER — Ambulatory Visit (INDEPENDENT_AMBULATORY_CARE_PROVIDER_SITE_OTHER): Payer: Medicare Other | Admitting: Internal Medicine

## 2016-01-28 VITALS — BP 108/64 | HR 83 | Wt 199.0 lb

## 2016-01-28 DIAGNOSIS — R1031 Right lower quadrant pain: Secondary | ICD-10-CM

## 2016-01-28 DIAGNOSIS — R0789 Other chest pain: Secondary | ICD-10-CM

## 2016-01-28 DIAGNOSIS — R21 Rash and other nonspecific skin eruption: Secondary | ICD-10-CM | POA: Diagnosis not present

## 2016-01-28 MED ORDER — KETOCONAZOLE 2 % EX CREA: 1.0000 "application " | TOPICAL_CREAM | Freq: Two times a day (BID) | CUTANEOUS | Status: DC

## 2016-01-28 MED ORDER — HYDROCODONE-ACETAMINOPHEN 7.5-325 MG PO TABS: 1.0000 | ORAL_TABLET | Freq: Three times a day (TID) | ORAL | Status: DC | PRN

## 2016-01-28 NOTE — Progress Notes (Signed)
Pre visit review using our clinic review tool, if applicable. No additional management support is needed unless otherwise documented below in the visit note. 

## 2016-01-28 NOTE — Progress Notes (Signed)
Subjective:  Patient ID: Amanda Arnold, female    DOB: 15-Jan-1946  Age: 70 y.o. MRN: [id number]  CC: Chest Pain and Rash   HPI Amanda Arnold presents for c/o rash under breasts, R CP, LBP  Outpatient Prescriptions Prior to Visit  Medication Sig Dispense Refill  . ALPRAZolam (XANAX) 0.5 MG tablet Take 1 tablet (0.5 mg total) by mouth 2 (two) times daily as needed for anxiety. 60 tablet 2  . Cholecalciferol (VITAMIN D3) 2000 UNITS capsule Take 1 capsule (2,000 Units total) by mouth daily. 100 capsule 3  . HYDROcodone-acetaminophen (NORCO) 7.5-325 MG tablet Take 1 tablet by mouth every 8 (eight) hours as needed for severe pain. 90 tablet 0  . ketoconazole (NIZORAL) 2 % cream Apply 1 application topically 2 (two) times daily. In the groin 60 g 1  . meclizine (ANTIVERT) 12.5 MG tablet Take 1-2 tablets (12.5-25 mg total) by mouth 3 (three) times daily as needed for dizziness. 60 tablet 1  . metoprolol succinate (TOPROL-XL) 25 MG 24 hr tablet Take 1 tablet (25 mg total) by mouth every morning. 90 tablet 3  . umeclidinium-vilanterol (ANORO ELLIPTA) 62.5-25 MCG/INH AEPB Inhale 1 puff into the lungs daily. 1 each 11   No facility-administered medications prior to visit.    ROS Review of Systems  Constitutional: Positive for fatigue. Negative for chills, activity change, appetite change and unexpected weight change.  HENT: Negative for congestion, mouth sores and sinus pressure.   Eyes: Negative for visual disturbance.  Respiratory: Negative for cough and chest tightness.   Gastrointestinal: Negative for nausea and abdominal pain.  Genitourinary: Negative for frequency, difficulty urinating and vaginal pain.  Musculoskeletal: Positive for back pain. Negative for gait problem.  Skin: Positive for rash. Negative for pallor.  Neurological: Negative for dizziness, tremors, weakness, numbness and headaches.  Psychiatric/Behavioral: Negative for confusion and sleep disturbance.     Objective:  BP 108/64 mmHg  Pulse 83  Wt 199 lb (90.266 kg)  SpO2 94%  BP Readings from Last 3 Encounters:  01/28/16 108/64  12/05/15 120/58  11/09/15 140/72    Wt Readings from Last 3 Encounters:  01/28/16 199 lb (90.266 kg)  12/05/15 203 lb (92.08 kg)  11/09/15 204 lb (92.534 kg)    Physical Exam  Constitutional: She appears well-developed. No distress.  HENT:  Head: Normocephalic.  Right Ear: External ear normal.  Left Ear: External ear normal.  Nose: Nose normal.  Mouth/Throat: Oropharynx is clear and moist.  Eyes: Conjunctivae are normal. Pupils are equal, round, and reactive to light. Right eye exhibits no discharge. Left eye exhibits no discharge.  Neck: Normal range of motion. Neck supple. No JVD present. No tracheal deviation present. No thyromegaly present.  Cardiovascular: Normal rate, regular rhythm and normal heart sounds.   Pulmonary/Chest: No stridor. No respiratory distress. She has no wheezes.  Abdominal: Soft. Bowel sounds are normal. She exhibits no distension and no mass. There is no tenderness. There is no rebound and no guarding.  Musculoskeletal: She exhibits tenderness. She exhibits no edema.  Lymphadenopathy:    She has no cervical adenopathy.  Neurological: She displays normal reflexes. No cranial nerve deficit. She exhibits normal muscle tone. Coordination normal.  Skin: Rash noted. No erythema.  Psychiatric: She has a normal mood and affect. Her behavior is normal. Judgment and thought content normal.  LS is tender Intertrigo - breasts  Lab Results  Component Value Date   WBC 6.8 08/15/2015   HGB 14.0 08/15/2015   HCT  41.6 08/15/2015   PLT 222.0 08/15/2015   GLUCOSE 124* 10/19/2015   CHOL 183 04/21/2014   TRIG 155.0* 04/21/2014   HDL 44.00 04/21/2014   LDLCALC 108* 04/21/2014   ALT 31 05/15/2015   AST 33 05/15/2015   NA 140 10/19/2015   K 4.4 10/19/2015   CL 107 10/19/2015   CREATININE 0.75 10/19/2015   BUN 24* 10/19/2015    CO2 28 10/19/2015   TSH 1.07 05/15/2015   HGBA1C 6.1 10/19/2015    Ct Renal Stone Study  08/22/2015  CLINICAL DATA:  Right lower quadrant abdominal pain radiating to the right flank. Recent hematuria. EXAM: CT ABDOMEN AND PELVIS WITHOUT CONTRAST TECHNIQUE: Multidetector CT imaging of the abdomen and pelvis was performed following the standard protocol without IV contrast. COMPARISON:  Multiple exams, including 12/21/2009 FINDINGS: Lower chest: Minimal lower lingular scarring and dependent subsegmental atelectasis in the lower lobes. Mild scarring in the right middle lobe medially. Mild cardiomegaly. Hepatobiliary: Cholecystectomy. Pancreas: Unremarkable Spleen: Unremarkable Adrenals/Urinary Tract: 2.9 by 1.8 cm left adrenal mass, precontrast density 1 Hounsfield unit, compatible with adenoma. 1.3 by 1.4 cm right adrenal mass, precontrast density 8 Hounsfield units, compatible with adenoma. 5 mm right kidney lower pole nonobstructive calculus. Stomach/Bowel: Unremarkable Vascular/lymphatic: Aortoiliac atherosclerotic vascular disease. Reproductive: 2.1 cm calcified left fundal fibroid. Other: No supplemental non-categorized findings. Musculoskeletal: Multilevel lumbar degenerative disc disease causing multilevel lumbar impingement including relatively mild degrees of foraminal stenosis and central narrowing of the thecal sac at all levels between L2 and S1. Degenerative endplate sclerosis at 075-GRM especially eccentric to the left IMPRESSION: 1. 5 mm nonobstructive right kidney lower pole calculus. 2. Bilateral adrenal adenomas. 3. Mild bibasilar scarring and mild cardiomegaly. 4. Lumbar spondylosis and degenerative disc disease causing multilevel relatively mild impingement in the lumbar spine. 5.  Aortoiliac atherosclerotic vascular disease. 6. 2.1 cm calcified left fundal uterine fibroid. Electronically Signed   By: Van Clines M.D.   On: 08/22/2015 15:56    Assessment & Plan:   There are no  diagnoses linked to this encounter. I am having Ms. Bejarano maintain her Vitamin D3, ALPRAZolam, metoprolol succinate, HYDROcodone-acetaminophen, umeclidinium-vilanterol, meclizine, and ketoconazole.  No orders of the defined types were placed in this encounter.     Follow-up: No Follow-up on file.  Walker Kehr, MD

## 2016-01-28 NOTE — Assessment & Plan Note (Signed)
Norco prn  Potential benefits of a long term opioids use as well as potential risks (i.e. addiction risk, apnea etc) and complications (i.e. Somnolence, constipation and others) were explained to the patient and were aknowledged. 

## 2016-01-28 NOTE — Assessment & Plan Note (Signed)
R sided MS[facility] 7/17 Norco prn

## 2016-01-28 NOTE — Assessment & Plan Note (Signed)
Ketoconazole  topically - see rx

## 2016-02-09 ENCOUNTER — Emergency Department (HOSPITAL_COMMUNITY)
Admission: EM | Admit: 2016-02-09 | Discharge: 2016-02-09 | Disposition: A | Discharge status: Home / Self Care | Payer: Medicare Other | Attending: Emergency Medicine | Admitting: Emergency Medicine

## 2016-02-09 ENCOUNTER — Emergency Department (HOSPITAL_COMMUNITY): Payer: Medicare Other

## 2016-02-09 ENCOUNTER — Encounter (HOSPITAL_COMMUNITY): Payer: Self-pay

## 2016-02-09 DIAGNOSIS — I10 Essential (primary) hypertension: Secondary | ICD-10-CM | POA: Diagnosis not present

## 2016-02-09 DIAGNOSIS — J449 Chronic obstructive pulmonary disease, unspecified: Secondary | ICD-10-CM | POA: Insufficient documentation

## 2016-02-09 DIAGNOSIS — N390 Urinary tract infection, site not specified: Secondary | ICD-10-CM | POA: Diagnosis not present

## 2016-02-09 DIAGNOSIS — R1031 Right lower quadrant pain: Secondary | ICD-10-CM | POA: Diagnosis not present

## 2016-02-09 DIAGNOSIS — Z79899 Other long term (current) drug therapy: Secondary | ICD-10-CM | POA: Diagnosis not present

## 2016-02-09 DIAGNOSIS — Z87891 Personal history of nicotine dependence: Secondary | ICD-10-CM | POA: Insufficient documentation

## 2016-02-09 HISTORY — DX: Essential (primary) hypertension: I10

## 2016-02-09 HISTORY — DX: Type 2 diabetes mellitus without complications: E11.9

## 2016-02-09 LAB — COMPREHENSIVE METABOLIC PANEL
ALT: 18 U/L (ref 14–54)
AST: 21 U/L (ref 15–41)
Albumin: 3.7 g/dL (ref 3.5–5.0)
Alkaline Phosphatase: 80 U/L (ref 38–126)
Anion gap: 7 (ref 5–15)
BUN: 13 mg/dL (ref 6–20)
CO2: 24 mmol/L (ref 22–32)
Calcium: 9 mg/dL (ref 8.9–10.3)
Chloride: 108 mmol/L (ref 101–111)
Creatinine, Ser: 0.76 mg/dL (ref 0.44–1.00)
GFR calc Af Amer: >60 mL/min (ref >60)
GFR calc non Af Amer: >60 mL/min (ref >60)
Glucose, Bld: 120 mg/dL — ABNORMAL HIGH (ref 65–99)
Potassium: 4.4 mmol/L (ref 3.5–5.1)
Sodium: 139 mmol/L (ref 135–145)
Total Bilirubin: 0.6 mg/dL (ref 0.3–1.2)
Total Protein: 6.2 g/dL — ABNORMAL LOW (ref 6.5–8.1)

## 2016-02-09 LAB — CBC
HCT: 41.1 % (ref 36.0–46.0)
Hemoglobin: 13.3 g/dL (ref 12.0–15.0)
MCH: 31.7 pg (ref 26.0–34.0)
MCHC: 32.4 g/dL (ref 30.0–36.0)
MCV: 97.9 fL (ref 78.0–100.0)
Platelets: 221 10*3/uL (ref 150–400)
RBC: 4.2 MIL/uL (ref 3.87–5.11)
RDW: 13.4 % (ref 11.5–15.5)
WBC: 8.4 10*3/uL (ref 4.0–10.5)

## 2016-02-09 LAB — LIPASE, BLOOD: Lipase: 49 U/L (ref 11–51)

## 2016-02-09 LAB — URINALYSIS, ROUTINE W REFLEX MICROSCOPIC
Bilirubin Urine: NEGATIVE
Glucose, UA: NEGATIVE mg/dL
Hgb urine dipstick: NEGATIVE
Ketones, ur: NEGATIVE mg/dL
Nitrite: NEGATIVE
Protein, ur: NEGATIVE mg/dL
Specific Gravity, Urine: 1.025 (ref 1.005–1.030)
pH: 5 (ref 5.0–8.0)

## 2016-02-09 LAB — URINE MICROSCOPIC-ADD ON

## 2016-02-09 MED ORDER — FENTANYL CITRATE (PF) 100 MCG/2ML IJ SOLN
50.0000 ug | Freq: Once | INTRAMUSCULAR | Status: AC
  Administered 2016-02-09: 50 ug via INTRAVENOUS
  Filled 2016-02-09: qty 2

## 2016-02-09 MED ORDER — ONDANSETRON HCL 4 MG/2ML IJ SOLN
4.0000 mg | Freq: Once | INTRAMUSCULAR | Status: AC
  Administered 2016-02-09: 4 mg via INTRAVENOUS
  Filled 2016-02-09: qty 2

## 2016-02-09 MED ORDER — KETOROLAC TROMETHAMINE 30 MG/ML IJ SOLN
30.0000 mg | Freq: Once | INTRAMUSCULAR | Status: AC
  Administered 2016-02-09: 30 mg via INTRAVENOUS
  Filled 2016-02-09: qty 1

## 2016-02-09 MED ORDER — IBUPROFEN 800 MG PO TABS: 800.0000 mg | ORAL_TABLET | Freq: Three times a day (TID) | ORAL | 0 refills | Status: DC | PRN

## 2016-02-09 MED ORDER — CEPHALEXIN 250 MG PO CAPS
500.0000 mg | ORAL_CAPSULE | Freq: Once | ORAL | Status: AC
  Administered 2016-02-09: 500 mg via ORAL
  Filled 2016-02-09: qty 2

## 2016-02-09 MED ORDER — IOPAMIDOL (ISOVUE-300) INJECTION 61%
INTRAVENOUS | Status: AC
  Administered 2016-02-09: 100 mL
  Filled 2016-02-09: qty 100

## 2016-02-09 MED ORDER — CEPHALEXIN 500 MG PO CAPS: 500.0000 mg | ORAL_CAPSULE | Freq: Three times a day (TID) | ORAL | 0 refills | Status: DC

## 2016-02-09 NOTE — ED Notes (Addendum)
Pt is in stable condition upon d/c and ambulates from ED. 

## 2016-02-09 NOTE — ED Triage Notes (Addendum)
Patient here with 3 days of right lower abdominal pain with radiation to right side. Nausea and some diarrhea with same, pain worse with any movement. Denies dysuria.

## 2016-02-09 NOTE — Discharge Instructions (Signed)
You were seen in the emergency room for evaluation of abdominal pain. Your bloodwork was normal. Your urine showed signs of infection. I suspect this might be the cause of many of your symptoms. Your CT scan did not show a reason for your pain. There is a kidney stone on the right side that is unchanged and not likely causing your pain. I will treat you for your UTI. Take the entire course of antibiotics as prescribed. Take ibuprofen as needed for pain. Please follow up with your primary care provider next week. Return to the ER for new or worsening symptoms.

## 2016-02-09 NOTE — ED Notes (Signed)
Patient transported to CT 

## 2016-02-09 NOTE — ED Provider Notes (Signed)
[facility] DEPT Provider Note   CSN: [id number] Arrival date & time: 02/09/16  1448  First Provider Contact:  First MD Initiated Contact with Patient 02/09/16 1747      History   Chief Complaint Chief Complaint  Patient presents with  . Abdominal Pain   HPI  Amanda Arnold is an 70 y.o. female with history of HTN, HLD, anxiety, COPD who presents to the ED for evaluation of RLQ pain. She reports her pain started a few days ago and is progressively getting worse. The pain radiates to her right flank and down her right leg. The pain is worse with movement and coughing. She notes some increased urinary urgency and frequency but states that has been going on for longer than the pain. Denies dysuria or gross hematuria. She reports nausea is minimal and she has had no emesis. Denies diarrhea. Denies fever or chills. Only abdominal surgery is cholecystectomy. Denies chest pain or SOB. She has tried her home vicodin with no relief; states that ibuprofen worked better for her pain.   Past Medical History:  Diagnosis Date  . Abdominal pain, epigastric 12/18/2009  . ANXIETY 05/25/2006  . BRONCHITIS, ACUTE 12/08/2007  . COPD 05/25/2006  . DEPRESSION 01/02/2006  . ESOTROPIA, LEFT EYE 13-Dec-1945  . FIBROMYALGIA 04/02/2006  . Headache(784.0) 12/08/2007  . HYPERGLYCEMIA 05/15/2010  . HYPERLIPIDEMIA 04/30/2006  . HYPERTENSION 05/25/2006  . INSOMNIA, PERSISTENT 02/22/2008  . Lazy eye    left  . LOW BACK PAIN 01/27/2007  . Migraines   . 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 OBSTRUCTIVE SLEEP APNEA 04/17/2010  . OSTEOARTHRITIS 05/25/2006  . Polyuria   . Psychosomatic disease 2011  . 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 T M J 05/11/2009    Past Surgical History:  Procedure Laterality Date  . CHOLECYSTECTOMY      OB History    No data available       Home Medications    Prior to Admission medications   Medication Sig Start Date End Date Taking? Authorizing Provider  ALPRAZolam Duanne Moron) 0.5  MG tablet Take 1 tablet (0.5 mg total) by mouth 2 (two) times daily as needed for anxiety. 08/15/15   Cassandria Anger, MD  Cholecalciferol (VITAMIN D3) 2000 UNITS capsule Take 1 capsule (2,000 Units total) by mouth daily. 08/22/14   Cassandria Anger, MD  HYDROcodone-acetaminophen (NORCO) 7.5-325 MG tablet Take 1 tablet by mouth every 8 (eight) hours as needed for severe pain. 01/28/16   Evie Lacks Plotnikov, MD  ketoconazole (NIZORAL) 2 % cream Apply 1 application topically 2 (two) times daily. In the groin 01/28/16   Evie Lacks Plotnikov, MD  meclizine (ANTIVERT) 12.5 MG tablet Take 1-2 tablets (12.5-25 mg total) by mouth 3 (three) times daily as needed for dizziness. 12/05/15   Evie Lacks Plotnikov, MD  metoprolol succinate (TOPROL-XL) 25 MG 24 hr tablet Take 1 tablet (25 mg total) by mouth every morning. 10/19/15   Evie Lacks Plotnikov, MD  umeclidinium-vilanterol (ANORO ELLIPTA) 62.5-25 MCG/INH AEPB Inhale 1 puff into the lungs daily. 11/09/15   Cassandria Anger, MD    Family History Family History  Problem Relation Age of Onset  . Heart attack Father   . Hypertension Brother   . Heart disease Mother   . Colon cancer Neg Hx   . Stomach cancer Neg Hx     Social History Social History  Substance Use Topics  . Smoking status: Former Smoker    Quit date: 07/10/2013  . Smokeless tobacco: Current User  Types: Snuff     Comment: smoked 1965-2014, up to 1 ppd  . Alcohol use No     Allergies   Morphine and related; Bupropion hcl; Duloxetine; Ezetimibe; Metformin and related; Trazodone hcl; Gabapentin; and Tramadol hcl   Review of Systems Review of Systems  All other systems reviewed and are negative.    Physical Exam Updated Vital Signs BP 146/73 (BP Location: Left Arm)   Pulse 76   Temp 97.9 F (36.6 C) (Oral)   Resp 22   SpO2 97%   Physical Exam  Constitutional: She is oriented to person, place, and time.  HENT:  Right Ear: External ear normal.  Left Ear: External ear  normal.  Nose: Nose normal.  Mouth/Throat: Oropharynx is clear and moist. No oropharyngeal exudate.  Eyes: Conjunctivae and EOM are normal. Pupils are equal, round, and reactive to light.  Neck: Normal range of motion. Neck supple.  Cardiovascular: Normal rate, regular rhythm, normal heart sounds and intact distal pulses.   Pulmonary/Chest: Effort normal and breath sounds normal. No respiratory distress. She has no wheezes. She exhibits no tenderness.  Abdominal: Soft. Bowel sounds are normal. She exhibits no distension. There is no rebound.  Obese +RLQ tenderness with guarding. Negative rebound, negative rovsing's No CVA tenderness  Musculoskeletal: She exhibits no edema.  No midline back tenderness. No paraspinal tenderness. No stepoff or deformity.  Neurological: She is alert and oriented to person, place, and time. No cranial nerve deficit.  Skin: Skin is warm and dry.  Psychiatric: She has a normal mood and affect.  Nursing note and vitals reviewed.    ED Treatments / Results  Labs (all labs ordered are listed, but only abnormal results are displayed) Labs Reviewed  COMPREHENSIVE METABOLIC PANEL - Abnormal; Notable for the following:       Result Value   Glucose, Bld 120 (*)    Total Protein 6.2 (*)    All other components within normal limits  URINALYSIS, ROUTINE W REFLEX MICROSCOPIC (NOT AT [facility]) - Abnormal; Notable for the following:    APPearance CLOUDY (*)    Leukocytes, UA MODERATE (*)    All other components within normal limits  URINE MICROSCOPIC-ADD ON - Abnormal; Notable for the following:    Squamous Epithelial / LPF 6-30 (*)    Bacteria, UA MANY (*)    All other components within normal limits  URINE CULTURE  LIPASE, BLOOD  CBC    EKG  EKG Interpretation None       Radiology Ct Abdomen Pelvis W Contrast  Result Date: 02/09/2016 CLINICAL DATA:  Right lower quadrant abdominal pain for 2-3 days. Diarrhea. EXAM: CT ABDOMEN AND PELVIS WITH CONTRAST  TECHNIQUE: Multidetector CT imaging of the abdomen and pelvis was performed using the standard protocol following bolus administration of intravenous contrast. CONTRAST:  100 ISOVUE-300 IOPAMIDOL (ISOVUE-300) INJECTION 61% COMPARISON:  CT abdomen and pelvis 08/22/2015. FINDINGS: Lower chest: Bibasilar scarring is similar the prior study. No active disease is present. The heart is enlarged. There is no significant pleural or pericardial effusion. Hepatobiliary: Mild intra and extrahepatic biliary dilation is stable following cholecystectomy. No focal hepatic lesions are present. Pancreas: No focal inflammatory changes are present. There is no mass lesion or cyst no ductal dilation is present. 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 A punctate nonobstructing stone at the lower pole of the right kidney is stable. The ureters are within normal limits bilaterally. The urinary bladder is mostly collapsed. Stomach/Bowel: Stomach and  duodenum are within normal limits. The small bowel is unremarkable. The ileocecal valve is within normal limits. The appendix is visualized and normal. The ascending and transverse colon are within normal limits. The descending and rectosigmoid colon are unremarkable. Vascular/Lymphatic: Scattered atherosclerotic calcifications are present without aneurysm. No significant adenopathy is present. Reproductive: Fibroid at the fundus the uterus is stable. The uterus and adnexa are otherwise unremarkable. Other: No significant free fluid is present. Musculoskeletal: Chronic endplate marrow changes are present at L5-S1 bilaterally, worse on the left. Foraminal narrowing is present bilaterally have all levels from L2-3 through L5-S1. There is a vacuum disc at L4-5. No focal lytic or blastic lesions are present. There straightening of the normal lumbar lordosis. The pelvis is intact. Degenerative changes are noted at the SI joint. A homogeneous  subcutaneous soft tissue lease in is noted posterior to the right hip. IMPRESSION: 1. No acute or focal abnormality to explain the patient's right lower quadrant abdominal pain. 2. Normal appearance of the appendix and ileocecal valve. 3. Punctate nonobstructing stone at the lower pole of the right kidney is stable. 4. Atherosclerosis. 5. Uterine fibroid. 6. Moderate spondylosis of the lumbar spine. 7. Stable subcutaneous soft tissue lesion posterior to the left hip. This is nonspecific, but likely benign. It demonstrates very slow growth over time. Please correlate with exam. Electronically Signed   By: San Morelle M.D.   On: 02/09/2016 19:35   Procedures Procedures (including critical care time)  Medications Ordered in ED Medications  ketorolac (TORADOL) 30 MG/ML injection 30 mg (30 mg Intravenous Given 02/09/16 1821)  ondansetron (ZOFRAN) injection 4 mg (4 mg Intravenous Given 02/09/16 1821)  fentaNYL (SUBLIMAZE) injection 50 mcg (50 mcg Intravenous Given 02/09/16 1821)  iopamidol (ISOVUE-300) 61 % injection (100 mLs  Contrast Given 02/09/16 1910)  fentaNYL (SUBLIMAZE) injection 50 mcg (50 mcg Intravenous Given 02/09/16 2015)  cephALEXin (KEFLEX) capsule 500 mg (500 mg Oral Given 02/09/16 2015)     Initial Impression / Assessment and Plan / ED Course  I have reviewed the triage vital signs and the nursing notes.  Pertinent labs & imaging results that were available during my care of the patient were reviewed by me and considered in my medical decision making (see chart for details).  Clinical Course   Bloodwork unrevealing. UA with some evidence of infection but also appears contaminated. However, given pt"s degree of pain, tenderness, as well as age, CT abd/pelvis was obtained to r/o emergent intra-abdominal etiology. CT abd/pelvis negative for acute findings. There is a stable nonobstructing stone that pt reports has been present for years. She has no CVA tenderness and I doubt this  is causing pt's pain. We will treat her UTI with keflex and send for culture. Encouraged close PCP f/u. Her pain is improved and she is tolerating PO. ER return precautions given.  Final Clinical Impressions(s) / ED Diagnoses   Final diagnoses:  Urinary tract infection without hematuria, site unspecified  Right lower quadrant pain    New Prescriptions Discharge Medication List as of 02/09/2016  8:03 PM    START taking these medications   Details  cephALEXin (KEFLEX) 500 MG capsule Take 1 capsule (500 mg total) by mouth 3 (three) times daily., Starting Sat 02/09/2016, Print    ibuprofen (ADVIL,MOTRIN) 800 MG tablet Take 1 tablet (800 mg total) by mouth every 8 (eight) hours as needed., Starting Sat 02/09/2016, Print         Anne Ng, [place] 02/10/16 [place]  Elroy Channel, MD 02/10/16 1402

## 2016-02-11 LAB — URINE CULTURE

## 2016-03-05 ENCOUNTER — Ambulatory Visit: Payer: Medicare Other | Admitting: Internal Medicine

## 2016-03-12 ENCOUNTER — Ambulatory Visit (INDEPENDENT_AMBULATORY_CARE_PROVIDER_SITE_OTHER): Payer: Medicare Other | Admitting: Internal Medicine

## 2016-03-12 ENCOUNTER — Other Ambulatory Visit (INDEPENDENT_AMBULATORY_CARE_PROVIDER_SITE_OTHER): Payer: Medicare Other

## 2016-03-12 ENCOUNTER — Encounter: Payer: Self-pay | Admitting: Internal Medicine

## 2016-03-12 DIAGNOSIS — M5440 Lumbago with sciatica, unspecified side: Secondary | ICD-10-CM

## 2016-03-12 DIAGNOSIS — G8929 Other chronic pain: Secondary | ICD-10-CM

## 2016-03-12 DIAGNOSIS — M25559 Pain in unspecified hip: Secondary | ICD-10-CM

## 2016-03-12 DIAGNOSIS — R1031 Right lower quadrant pain: Secondary | ICD-10-CM | POA: Diagnosis not present

## 2016-03-12 DIAGNOSIS — E119 Type 2 diabetes mellitus without complications: Secondary | ICD-10-CM

## 2016-03-12 LAB — URINALYSIS
Bilirubin Urine: NEGATIVE
Hgb urine dipstick: NEGATIVE
Ketones, ur: NEGATIVE
Leukocytes, UA: NEGATIVE
Nitrite: NEGATIVE
Specific Gravity, Urine: >=1.03 — AB (ref 1.000–1.030)
Total Protein, Urine: NEGATIVE
Urine Glucose: NEGATIVE
Urobilinogen, UA: 0.2 (ref 0.0–1.0)
pH: 5.5 (ref 5.0–8.0)

## 2016-03-12 MED ORDER — ALPRAZOLAM 0.5 MG PO TABS: 0.5000 mg | ORAL_TABLET | Freq: Two times a day (BID) | ORAL | 2 refills | Status: DC | PRN

## 2016-03-12 MED ORDER — HYDROCODONE-ACETAMINOPHEN 7.5-325 MG PO TABS: 1.0000 | ORAL_TABLET | Freq: Three times a day (TID) | ORAL | 0 refills | Status: DC | PRN

## 2016-03-12 MED ORDER — PREDNISONE 10 MG PO TABS: ORAL_TABLET | ORAL | 1 refills | Status: DC

## 2016-03-12 NOTE — Progress Notes (Addendum)
Subjective:  Patient ID: Amanda Arnold, female    DOB: 1946/03/03  Age: 70 y.o. MRN: [id number]  CC: Back Pain (Right side) and Leg Pain (Left)   HPI Amanda Arnold presents for severe pain in the L leg and R buttock - worse w/sitting and walking, better w/walking. Better at night...  Outpatient Medications Prior to Visit  Medication Sig Dispense Refill  . ALPRAZolam (XANAX) 0.5 MG tablet Take 1 tablet (0.5 mg total) by mouth 2 (two) times daily as needed for anxiety. 60 tablet 2  . Cholecalciferol (VITAMIN D3) 2000 UNITS capsule Take 1 capsule (2,000 Units total) by mouth daily. 100 capsule 3  . HYDROcodone-acetaminophen (NORCO) 7.5-325 MG tablet Take 1 tablet by mouth every 8 (eight) hours as needed for severe pain. 90 tablet 0  . ketoconazole (NIZORAL) 2 % cream Apply 1 application topically 2 (two) times daily. In the groin 60 g 1  . meclizine (ANTIVERT) 12.5 MG tablet Take 1-2 tablets (12.5-25 mg total) by mouth 3 (three) times daily as needed for dizziness. 60 tablet 1  . metoprolol succinate (TOPROL-XL) 25 MG 24 hr tablet Take 1 tablet (25 mg total) by mouth every morning. 90 tablet 3  . umeclidinium-vilanterol (ANORO ELLIPTA) 62.5-25 MCG/INH AEPB Inhale 1 puff into the lungs daily. 1 each 11  . cephALEXin (KEFLEX) 500 MG capsule Take 1 capsule (500 mg total) by mouth 3 (three) times daily. 21 capsule 0  . ibuprofen (ADVIL,MOTRIN) 800 MG tablet Take 1 tablet (800 mg total) by mouth every 8 (eight) hours as needed. (Patient not taking: Reported on 03/12/2016) 21 tablet 0   No facility-administered medications prior to visit.     ROS Review of Systems  Constitutional: Positive for fatigue. Negative for activity change, appetite change, chills and unexpected weight change.  HENT: Negative for congestion, mouth sores and sinus pressure.   Eyes: Negative for visual disturbance.  Respiratory: Negative for cough and chest tightness.   Gastrointestinal: Negative for abdominal pain  and nausea.  Genitourinary: Negative for difficulty urinating, frequency and vaginal pain.  Musculoskeletal: Positive for arthralgias, back pain and gait problem.  Skin: Negative for pallor and rash.  Neurological: Negative for dizziness, tremors, weakness, numbness and headaches.  Psychiatric/Behavioral: Negative for confusion and sleep disturbance.    Objective:  BP 130/80   Pulse 81   Temp 98.1 F (36.7 C) (Oral)   Wt 201 lb (91.2 kg)   SpO2 95%   BMI 36.76 kg/m   BP Readings from Last 3 Encounters:  03/12/16 130/80  02/09/16 118/64  01/28/16 108/64    Wt Readings from Last 3 Encounters:  03/12/16 201 lb (91.2 kg)  01/28/16 199 lb (90.3 kg)  12/05/15 203 lb (92.1 kg)    Physical Exam  Constitutional: She appears well-developed. No distress.  HENT:  Head: Normocephalic.  Right Ear: External ear normal.  Left Ear: External ear normal.  Nose: Nose normal.  Mouth/Throat: Oropharynx is clear and moist.  Eyes: Conjunctivae are normal. Pupils are equal, round, and reactive to light. Right eye exhibits no discharge. Left eye exhibits no discharge.  Neck: Normal range of motion. Neck supple. No JVD present. No tracheal deviation present. No thyromegaly present.  Cardiovascular: Normal rate, regular rhythm and normal heart sounds.   Pulmonary/Chest: No stridor. No respiratory distress. She has no wheezes.  Abdominal: Soft. Bowel sounds are normal. She exhibits no distension and no mass. There is no tenderness. There is no rebound and no guarding.  Musculoskeletal: She  exhibits tenderness. She exhibits no edema.  Lymphadenopathy:    She has no cervical adenopathy.  Neurological: She displays normal reflexes. No cranial nerve deficit. She exhibits normal muscle tone. Coordination normal.  Skin: No rash noted. No erythema.  Psychiatric: She has a normal mood and affect. Her behavior is normal. Judgment and thought content normal.  Obese L buttock is tender to palp Str leg  elev is (+/-) on the R  Lab Results  Component Value Date   WBC 8.4 02/09/2016   HGB 13.3 02/09/2016   HCT 41.1 02/09/2016   PLT 221 02/09/2016   GLUCOSE 120 (H) 02/09/2016   CHOL 183 04/21/2014   TRIG 155.0 (H) 04/21/2014   HDL 44.00 04/21/2014   LDLCALC 108 (H) 04/21/2014   ALT 18 02/09/2016   AST 21 02/09/2016   NA 139 02/09/2016   K 4.4 02/09/2016   CL 108 02/09/2016   CREATININE 0.76 02/09/2016   BUN 13 02/09/2016   CO2 24 02/09/2016   TSH 1.07 05/15/2015   HGBA1C 6.1 10/19/2015    Ct Abdomen Pelvis W Contrast  Result Date: 02/09/2016 CLINICAL DATA:  Right lower quadrant abdominal pain for 2-3 days. Diarrhea. EXAM: CT ABDOMEN AND PELVIS WITH CONTRAST TECHNIQUE: Multidetector CT imaging of the abdomen and pelvis was performed using the standard protocol following bolus administration of intravenous contrast. CONTRAST:  100 ISOVUE-300 IOPAMIDOL (ISOVUE-300) INJECTION 61% COMPARISON:  CT abdomen and pelvis 08/22/2015. FINDINGS: Lower chest: Bibasilar scarring is similar the prior study. No active disease is present. The heart is enlarged. There is no significant pleural or pericardial effusion. Hepatobiliary: Mild intra and extrahepatic biliary dilation is stable following cholecystectomy. No focal hepatic lesions are present. Pancreas: No focal inflammatory changes are present. There is no mass lesion or cyst no ductal dilation is present. 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 A punctate nonobstructing stone at the lower pole of the right kidney is stable. The ureters are within normal limits bilaterally. The urinary bladder is mostly collapsed. Stomach/Bowel: Stomach and duodenum are within normal limits. The small bowel is unremarkable. The ileocecal valve is within normal limits. The appendix is visualized and normal. The ascending and transverse colon are within normal limits. The descending and rectosigmoid colon are  unremarkable. Vascular/Lymphatic: Scattered atherosclerotic calcifications are present without aneurysm. No significant adenopathy is present. Reproductive: Fibroid at the fundus the uterus is stable. The uterus and adnexa are otherwise unremarkable. Other: No significant free fluid is present. Musculoskeletal: Chronic endplate marrow changes are present at L5-S1 bilaterally, worse on the left. Foraminal narrowing is present bilaterally have all levels from L2-3 through L5-S1. There is a vacuum disc at L4-5. No focal lytic or blastic lesions are present. There straightening of the normal lumbar lordosis. The pelvis is intact. Degenerative changes are noted at the SI joint. A homogeneous subcutaneous soft tissue lease in is noted posterior to the right hip. IMPRESSION: 1. No acute or focal abnormality to explain the patient's right lower quadrant abdominal pain. 2. Normal appearance of the appendix and ileocecal valve. 3. Punctate nonobstructing stone at the lower pole of the right kidney is stable. 4. Atherosclerosis. 5. Uterine fibroid. 6. Moderate spondylosis of the lumbar spine. 7. Stable subcutaneous soft tissue lesion posterior to the left hip. This is nonspecific, but likely benign. It demonstrates very slow growth over time. Please correlate with exam. Electronically Signed   By: San Morelle M.D.   On: 02/09/2016 19:35   Assessment & Plan:  There are no diagnoses linked to this encounter. I have discontinued Ms. Balan's cephALEXin. I am also having her maintain her Vitamin D3, ALPRAZolam, metoprolol succinate, umeclidinium-vilanterol, meclizine, ketoconazole, HYDROcodone-acetaminophen, and ibuprofen.  No orders of the defined types were placed in this encounter.    Follow-up: No Follow-up on file.  Walker Kehr, MD

## 2016-03-12 NOTE — Assessment & Plan Note (Addendum)
Chronic MSK/OA - worse. R/o spinal stenosis Norco prn  Potential benefits of a long term opioids use as well as potential risks (i.e. addiction risk, apnea etc) and complications (i.e. Somnolence, constipation and others) were explained to the patient and were aknowledged.  MRI 9/14: IMPRESSION:  Multilevel lumbar spondylosis and facet arthrosis. In this patient  with right lower extremity radicular symptoms, L3-L4 disease is most  likely with compression of the exiting right L3 nerve in the lateral  aspect of the foramen. This is associated with a right-sided disk  protrusion compressing the nerve between protrusion and  hypertrophied facet joints.

## 2016-03-12 NOTE — Assessment & Plan Note (Signed)
8/17 B sciatica Sciatica stretch Prednisone 10 mg: take 4 tabs a day x 3 days; then 3 tabs a day x 4 days; then 2 tabs a day x 4 days, then 1 tab a day x 6 days, then stop. Take pc.  Potential benefits of  steroid  use as well as potential risks  and complications were explained to the patient and were aknowledged. MRI

## 2016-03-12 NOTE — Patient Instructions (Signed)
Sciatica With Rehab The sciatic nerve runs from the back down the leg and is responsible for sensation and control of the muscles in the back (posterior) side of the thigh, lower leg, and foot. Sciatica is a condition that is characterized by inflammation of this nerve.  SYMPTOMS   Signs of nerve damage, including numbness and/or weakness along the posterior side of the lower extremity.  Pain in the back of the thigh that may also travel down the leg.  Pain that worsens when sitting for long periods of time.  Occasionally, pain in the back or buttock. CAUSES  Inflammation of the sciatic nerve is the cause of sciatica. The inflammation is due to something irritating the nerve. Common sources of irritation include:  Sitting for long periods of time.  Direct trauma to the nerve.  Arthritis of the spine.  Herniated or ruptured disk.  Slipping of the vertebrae (spondylolisthesis).  Pressure from soft tissues, such as muscles or ligament-like tissue (fascia). RISK INCREASES WITH:  Sports that place pressure or stress on the spine (football or weightlifting).  Poor strength and flexibility.  Failure to warm up properly before activity.  Family history of low back pain or disk disorders.  Previous back injury or surgery.  Poor body mechanics, especially when lifting, or poor posture. PREVENTION   Warm up and stretch properly before activity.  Maintain physical fitness:  Strength, flexibility, and endurance.  Cardiovascular fitness.  Learn and use proper technique, especially with posture and lifting. When possible, have coach correct improper technique.  Avoid activities that place stress on the spine. PROGNOSIS If treated properly, then sciatica usually resolves within 6 weeks. However, occasionally surgery is necessary.  RELATED COMPLICATIONS   Permanent nerve damage, including pain, numbness, tingle, or weakness.  Chronic back pain.  Risks of surgery: infection,  bleeding, nerve damage, or damage to surrounding tissues. TREATMENT Treatment initially involves resting from any activities that aggravate your symptoms. The use of ice and medication may help reduce pain and inflammation. The use of strengthening and stretching exercises may help reduce pain with activity. These exercises may be performed at home or with referral to a therapist. A therapist may recommend further treatments, such as transcutaneous electronic nerve stimulation (TENS) or ultrasound. Your caregiver may recommend corticosteroid injections to help reduce inflammation of the sciatic nerve. If symptoms persist despite non-surgical (conservative) treatment, then surgery may be recommended. MEDICATION  If pain medication is necessary, then nonsteroidal anti-inflammatory medications, such as aspirin and ibuprofen, or other minor pain relievers, such as acetaminophen, are often recommended.  Do not take pain medication for 7 days before surgery.  Prescription pain relievers may be given if deemed necessary by your caregiver. Use only as directed and only as much as you need.  Ointments applied to the skin may be helpful.  Corticosteroid injections may be given by your caregiver. These injections should be reserved for the most serious cases, because they may only be given a certain number of times. HEAT AND COLD  Cold treatment (icing) relieves pain and reduces inflammation. Cold treatment should be applied for 10 to 15 minutes every 2 to 3 hours for inflammation and pain and immediately after any activity that aggravates your symptoms. Use ice packs or massage the area with a piece of ice (ice massage).  Heat treatment may be used prior to performing the stretching and strengthening activities prescribed by your caregiver, physical therapist, or athletic trainer. Use a heat pack or soak the injury in warm water.   SEEK MEDICAL CARE IF:  Treatment seems to offer no benefit, or the condition  worsens.  Any medications produce adverse side effects. EXERCISES  RANGE OF MOTION (ROM) AND STRETCHING EXERCISES - Sciatica Most people with sciatic will find that their symptoms worsen with either excessive bending forward (flexion) or arching at the low back (extension). The exercises which will help resolve your symptoms will focus on the opposite motion. Your physician, physical therapist or athletic trainer will help you determine which exercises will be most helpful to resolve your low back pain. Do not complete any exercises without first consulting with your clinician. Discontinue any exercises which worsen your symptoms until you speak to your clinician. If you have pain, numbness or tingling which travels down into your buttocks, leg or foot, the goal of the therapy is for these symptoms to move closer to your back and eventually resolve. Occasionally, these leg symptoms will get better, but your low back pain may worsen; this is typically an indication of progress in your rehabilitation. Be certain to be very alert to any changes in your symptoms and the activities in which you participated in the 24 hours prior to the change. Sharing this information with your clinician will allow him/her to most efficiently treat your condition. These exercises may help you when beginning to rehabilitate your injury. Your symptoms may resolve with or without further involvement from your physician, physical therapist or athletic trainer. While completing these exercises, remember:   Restoring tissue flexibility helps normal motion to return to the joints. This allows healthier, less painful movement and activity.  An effective stretch should be held for at least 30 seconds.  A stretch should never be painful. You should only feel a gentle lengthening or release in the stretched tissue. FLEXION RANGE OF MOTION AND STRETCHING EXERCISES: STRETCH - Flexion, Single Knee to Chest   Lie on a firm bed or floor  with both legs extended in front of you.  Keeping one leg in contact with the floor, bring your opposite knee to your chest. Hold your leg in place by either grabbing behind your thigh or at your knee.  Pull until you feel a gentle stretch in your low back. Hold __________ seconds.  Slowly release your grasp and repeat the exercise with the opposite side. Repeat __________ times. Complete this exercise __________ times per day.  STRETCH - Flexion, Double Knee to Chest  Lie on a firm bed or floor with both legs extended in front of you.  Keeping one leg in contact with the floor, bring your opposite knee to your chest.  Tense your stomach muscles to support your back and then lift your other knee to your chest. Hold your legs in place by either grabbing behind your thighs or at your knees.  Pull both knees toward your chest until you feel a gentle stretch in your low back. Hold __________ seconds.  Tense your stomach muscles and slowly return one leg at a time to the floor. Repeat __________ times. Complete this exercise __________ times per day.  STRETCH - Low Trunk Rotation   Lie on a firm bed or floor. Keeping your legs in front of you, bend your knees so they are both pointed toward the ceiling and your feet are flat on the floor.  Extend your arms out to the side. This will stabilize your upper body by keeping your shoulders in contact with the floor.  Gently and slowly drop both knees together to one side until   you feel a gentle stretch in your low back. Hold for __________ seconds.  Tense your stomach muscles to support your low back as you bring your knees back to the starting position. Repeat the exercise to the other side. Repeat __________ times. Complete this exercise __________ times per day  EXTENSION RANGE OF MOTION AND FLEXIBILITY EXERCISES: STRETCH - Extension, Prone on Elbows  Lie on your stomach on the floor, a bed will be too soft. Place your palms about shoulder  width apart and at the height of your head.  Place your elbows under your shoulders. If this is too painful, stack pillows under your chest.  Allow your body to relax so that your hips drop lower and make contact more completely with the floor.  Hold this position for __________ seconds.  Slowly return to lying flat on the floor. Repeat __________ times. Complete this exercise __________ times per day.  RANGE OF MOTION - Extension, Prone Press Ups  Lie on your stomach on the floor, a bed will be too soft. Place your palms about shoulder width apart and at the height of your head.  Keeping your back as relaxed as possible, slowly straighten your elbows while keeping your hips on the floor. You may adjust the placement of your hands to maximize your comfort. As you gain motion, your hands will come more underneath your shoulders.  Hold this position __________ seconds.  Slowly return to lying flat on the floor. Repeat __________ times. Complete this exercise __________ times per day.  STRENGTHENING EXERCISES - Sciatica  These exercises may help you when beginning to rehabilitate your injury. These exercises should be done near your "sweet spot." This is the neutral, low-back arch, somewhere between fully rounded and fully arched, that is your least painful position. When performed in this safe range of motion, these exercises can be used for people who have either a flexion or extension based injury. These exercises may resolve your symptoms with or without further involvement from your physician, physical therapist or athletic trainer. While completing these exercises, remember:   Muscles can gain both the endurance and the strength needed for everyday activities through controlled exercises.  Complete these exercises as instructed by your physician, physical therapist or athletic trainer. Progress with the resistance and repetition exercises only as your caregiver advises.  You may  experience muscle soreness or fatigue, but the pain or discomfort you are trying to eliminate should never worsen during these exercises. If this pain does worsen, stop and make certain you are following the directions exactly. If the pain is still present after adjustments, discontinue the exercise until you can discuss the trouble with your clinician. STRENGTHENING - Deep Abdominals, Pelvic Tilt   Lie on a firm bed or floor. Keeping your legs in front of you, bend your knees so they are both pointed toward the ceiling and your feet are flat on the floor.  Tense your lower abdominal muscles to press your low back into the floor. This motion will rotate your pelvis so that your tail bone is scooping upwards rather than pointing at your feet or into the floor.  With a gentle tension and even breathing, hold this position for __________ seconds. Repeat __________ times. Complete this exercise __________ times per day.  STRENGTHENING - Abdominals, Crunches   Lie on a firm bed or floor. Keeping your legs in front of you, bend your knees so they are both pointed toward the ceiling and your feet are flat on the   floor. Cross your arms over your chest.  Slightly tip your chin down without bending your neck.  Tense your abdominals and slowly lift your trunk high enough to just clear your shoulder blades. Lifting higher can put excessive stress on the low back and does not further strengthen your abdominal muscles.  Control your return to the starting position. Repeat __________ times. Complete this exercise __________ times per day.  STRENGTHENING - Quadruped, Opposite UE/LE Lift  Assume a hands and knees position on a firm surface. Keep your hands under your shoulders and your knees under your hips. You may place padding under your knees for comfort.  Find your neutral spine and gently tense your abdominal muscles so that you can maintain this position. Your shoulders and hips should form a rectangle  that is parallel with the floor and is not twisted.  Keeping your trunk steady, lift your right hand no higher than your shoulder and then your left leg no higher than your hip. Make sure you are not holding your breath. Hold this position __________ seconds.  Continuing to keep your abdominal muscles tense and your back steady, slowly return to your starting position. Repeat with the opposite arm and leg. Repeat __________ times. Complete this exercise __________ times per day.  STRENGTHENING - Abdominals and Quadriceps, Straight Leg Raise   Lie on a firm bed or floor with both legs extended in front of you.  Keeping one leg in contact with the floor, bend the other knee so that your foot can rest flat on the floor.  Find your neutral spine, and tense your abdominal muscles to maintain your spinal position throughout the exercise.  Slowly lift your straight leg off the floor about 6 inches for a count of 15, making sure to not hold your breath.  Still keeping your neutral spine, slowly lower your leg all the way to the floor. Repeat this exercise with each leg __________ times. Complete this exercise __________ times per day. POSTURE AND BODY MECHANICS CONSIDERATIONS - Sciatica Keeping correct posture when sitting, standing or completing your activities will reduce the stress put on different body tissues, allowing injured tissues a chance to heal and limiting painful experiences. The following are general guidelines for improved posture. Your physician or physical therapist will provide you with any instructions specific to your needs. While reading these guidelines, remember:  The exercises prescribed by your provider will help you have the flexibility and strength to maintain correct postures.  The correct posture provides the optimal environment for your joints to work. All of your joints have less wear and tear when properly supported by a spine with good posture. This means you will  experience a healthier, less painful body.  Correct posture must be practiced with all of your activities, especially prolonged sitting and standing. Correct posture is as important when doing repetitive low-stress activities (typing) as it is when doing a single heavy-load activity (lifting). RESTING POSITIONS Consider which positions are most painful for you when choosing a resting position. If you have pain with flexion-based activities (sitting, bending, stooping, squatting), choose a position that allows you to rest in a less flexed posture. You would want to avoid curling into a fetal position on your side. If your pain worsens with extension-based activities (prolonged standing, working overhead), avoid resting in an extended position such as sleeping on your stomach. Most people will find more comfort when they rest with their spine in a more neutral position, neither too rounded nor too   arched. Lying on a non-sagging bed on your side with a pillow between your knees, or on your back with a pillow under your knees will often provide some relief. Keep in mind, being in any one position for a prolonged period of time, no matter how correct your posture, can still lead to stiffness. PROPER SITTING POSTURE In order to minimize stress and discomfort on your spine, you must sit with correct posture Sitting with good posture should be effortless for a healthy body. Returning to good posture is a gradual process. Many people can work toward this most comfortably by using various supports until they have the flexibility and strength to maintain this posture on their own. When sitting with proper posture, your ears will fall over your shoulders and your shoulders will fall over your hips. You should use the back of the chair to support your upper back. Your low back will be in a neutral position, just slightly arched. You may place a small pillow or folded towel at the base of your low back for support.  When  working at a desk, create an environment that supports good, upright posture. Without extra support, muscles fatigue and lead to excessive strain on joints and other tissues. Keep these recommendations in mind: CHAIR:   A chair should be able to slide under your desk when your back makes contact with the back of the chair. This allows you to work closely.  The chair's height should allow your eyes to be level with the upper part of your monitor and your hands to be slightly lower than your elbows. BODY POSITION  Your feet should make contact with the floor. If this is not possible, use a foot rest.  Keep your ears over your shoulders. This will reduce stress on your neck and low back. INCORRECT SITTING POSTURES   If you are feeling tired and unable to assume a healthy sitting posture, do not slouch or slump. This puts excessive strain on your back tissues, causing more damage and pain. Healthier options include:  Using more support, like a lumbar pillow.  Switching tasks to something that requires you to be upright or walking.  Talking a brief walk.  Lying down to rest in a neutral-spine position. PROLONGED STANDING WHILE SLIGHTLY LEANING FORWARD  When completing a task that requires you to lean forward while standing in one place for a long time, place either foot up on a stationary 2-4 inch high object to help maintain the best posture. When both feet are on the ground, the low back tends to lose its slight inward curve. If this curve flattens (or becomes too large), then the back and your other joints will experience too much stress, fatigue more quickly and can cause pain.  CORRECT STANDING POSTURES Proper standing posture should be assumed with all daily activities, even if they only take a few moments, like when brushing your teeth. As in sitting, your ears should fall over your shoulders and your shoulders should fall over your hips. You should keep a slight tension in your abdominal  muscles to brace your spine. Your tailbone should point down to the ground, not behind your body, resulting in an over-extended swayback posture.  INCORRECT STANDING POSTURES  Common incorrect standing postures include a forward head, locked knees and/or an excessive swayback. WALKING Walk with an upright posture. Your ears, shoulders and hips should all line-up. PROLONGED ACTIVITY IN A FLEXED POSITION When completing a task that requires you to bend forward   at your waist or lean over a low surface, try to find a way to stabilize 3 of 4 of your limbs. You can place a hand or elbow on your thigh or rest a knee on the surface you are reaching across. This will provide you more stability so that your muscles do not fatigue as quickly. By keeping your knees relaxed, or slightly bent, you will also reduce stress across your low back. CORRECT LIFTING TECHNIQUES DO :   Assume a wide stance. This will provide you more stability and the opportunity to get as close as possible to the object which you are lifting.  Tense your abdominals to brace your spine; then bend at the knees and hips. Keeping your back locked in a neutral-spine position, lift using your leg muscles. Lift with your legs, keeping your back straight.  Test the weight of unknown objects before attempting to lift them.  Try to keep your elbows locked down at your sides in order get the best strength from your shoulders when carrying an object.  Always ask for help when lifting heavy or awkward objects. INCORRECT LIFTING TECHNIQUES DO NOT:   Lock your knees when lifting, even if it is a small object.  Bend and twist. Pivot at your feet or move your feet when needing to change directions.  Assume that you cannot safely pick up a paperclip without proper posture.   This information is not intended to replace advice given to you by your health care provider. Make sure you discuss any questions you have with your health care provider.     Document Released: 06/30/2005 Document Revised: 11/14/2014 Document Reviewed: 10/12/2008 [employer] Interactive Patient Education 2016 [employer]  

## 2016-03-12 NOTE — Progress Notes (Signed)
Pre visit review using our clinic review tool, if applicable. No additional management support is needed unless otherwise documented below in the visit note. 

## 2016-03-19 ENCOUNTER — Telehealth: Payer: Self-pay | Admitting: *Deleted

## 2016-03-19 DIAGNOSIS — E119 Type 2 diabetes mellitus without complications: Secondary | ICD-10-CM

## 2016-03-19 DIAGNOSIS — R1031 Right lower quadrant pain: Secondary | ICD-10-CM

## 2016-03-19 NOTE — Telephone Encounter (Signed)
Rec'd fax pt requesting refills on her alprazolam 0.5mg . Last filled 12/13/15...Johny Chess

## 2016-03-19 NOTE — Telephone Encounter (Signed)
OK to fill this prescription with additional refills x2 Thank you!  

## 2016-03-20 MED ORDER — ALPRAZOLAM 0.5 MG PO TABS: 0.5000 mg | ORAL_TABLET | Freq: Two times a day (BID) | ORAL | 2 refills | Status: DC | PRN

## 2016-03-20 NOTE — Telephone Encounter (Signed)
Updated medication refill called refill into [facility] had to leave on pharmacy vm...Johny Chess

## 2016-03-28 ENCOUNTER — Encounter: Payer: Self-pay | Admitting: Internal Medicine

## 2016-03-28 ENCOUNTER — Ambulatory Visit (INDEPENDENT_AMBULATORY_CARE_PROVIDER_SITE_OTHER): Payer: Medicare Other | Admitting: Internal Medicine

## 2016-03-28 DIAGNOSIS — G2581 Restless legs syndrome: Secondary | ICD-10-CM

## 2016-03-28 DIAGNOSIS — R5383 Other fatigue: Secondary | ICD-10-CM | POA: Diagnosis not present

## 2016-03-28 DIAGNOSIS — R21 Rash and other nonspecific skin eruption: Secondary | ICD-10-CM | POA: Diagnosis not present

## 2016-03-28 DIAGNOSIS — I10 Essential (primary) hypertension: Secondary | ICD-10-CM | POA: Diagnosis not present

## 2016-03-28 MED ORDER — METOPROLOL SUCCINATE ER 25 MG PO TB24: 12.5000 mg | ORAL_TABLET | Freq: Every day | ORAL | 3 refills | Status: DC

## 2016-03-28 MED ORDER — METOPROLOL SUCCINATE ER 25 MG PO TB24: 12.5000 mg | ORAL_TABLET | Freq: Every morning | ORAL | 3 refills | Status: DC

## 2016-03-28 MED ORDER — PRAMIPEXOLE DIHYDROCHLORIDE 0.125 MG PO TABS: 0.1250 mg | ORAL_TABLET | Freq: Every day | ORAL | 5 refills | Status: DC

## 2016-03-28 NOTE — Assessment & Plan Note (Signed)
Mirapex at hs

## 2016-03-28 NOTE — Assessment & Plan Note (Signed)
Reduce Toprol to 12.5 mg at hs due to low BP

## 2016-03-28 NOTE — Assessment & Plan Note (Signed)
intertrigo - worse Derm ref

## 2016-03-28 NOTE — Progress Notes (Signed)
Pre visit review using our clinic review tool, if applicable. No additional management support is needed unless otherwise documented below in the visit note. 

## 2016-03-28 NOTE — Progress Notes (Signed)
Subjective:  Patient ID: Amanda Arnold, female    DOB: Jun 29, 1946  Age: 70 y.o. MRN: [id number]  CC: No chief complaint on file.   HPI Amanda Arnold presents for sciatica - better on Prednisone. C/o fatigue. Low BP today - has not taken her BP med yet. C/o rash in the groin. Moving legs at night all the time  Outpatient Medications Prior to Visit  Medication Sig Dispense Refill  . ALPRAZolam (XANAX) 0.5 MG tablet Take 1 tablet (0.5 mg total) by mouth 2 (two) times daily as needed for anxiety. 60 tablet 2  . Cholecalciferol (VITAMIN D3) 2000 UNITS capsule Take 1 capsule (2,000 Units total) by mouth daily. 100 capsule 3  . HYDROcodone-acetaminophen (NORCO) 7.5-325 MG tablet Take 1 tablet by mouth every 8 (eight) hours as needed for severe pain. 90 tablet 0  . ibuprofen (ADVIL,MOTRIN) 800 MG tablet Take 1 tablet (800 mg total) by mouth every 8 (eight) hours as needed. 21 tablet 0  . ketoconazole (NIZORAL) 2 % cream Apply 1 application topically 2 (two) times daily. In the groin 60 g 1  . meclizine (ANTIVERT) 12.5 MG tablet Take 1-2 tablets (12.5-25 mg total) by mouth 3 (three) times daily as needed for dizziness. 60 tablet 1  . metoprolol succinate (TOPROL-XL) 25 MG 24 hr tablet Take 1 tablet (25 mg total) by mouth every morning. 90 tablet 3  . predniSONE (DELTASONE) 10 MG tablet Prednisone 10 mg: take 4 tabs a day x 3 days; then 3 tabs a day x 4 days; then 2 tabs a day x 4 days, then 1 tab a day x 6 days, then stop. Take pc. 38 tablet 1  . umeclidinium-vilanterol (ANORO ELLIPTA) 62.5-25 MCG/INH AEPB Inhale 1 puff into the lungs daily. 1 each 11   No facility-administered medications prior to visit.     ROS Review of Systems  Constitutional: Positive for fatigue. Negative for activity change, appetite change, chills and unexpected weight change.  HENT: Negative for congestion, mouth sores and sinus pressure.   Eyes: Negative for visual disturbance.  Respiratory: Negative for cough  and chest tightness.   Gastrointestinal: Negative for abdominal pain and nausea.  Genitourinary: Negative for difficulty urinating, frequency and vaginal pain.  Musculoskeletal: Negative for back pain and gait problem.  Skin: Negative for pallor and rash.  Neurological: Negative for dizziness, tremors, weakness, numbness and headaches.  Psychiatric/Behavioral: Negative for confusion and sleep disturbance.    Objective:  BP (!) 112/58   Pulse 69   Wt 203 lb (92.1 kg)   SpO2 95%   BMI 37.13 kg/m   BP Readings from Last 3 Encounters:  03/28/16 (!) 112/58  03/12/16 130/80  02/09/16 118/64    Wt Readings from Last 3 Encounters:  03/28/16 203 lb (92.1 kg)  03/12/16 201 lb (91.2 kg)  01/28/16 199 lb (90.3 kg)    Physical Exam  Constitutional: She appears well-developed. No distress.  HENT:  Head: Normocephalic.  Right Ear: External ear normal.  Left Ear: External ear normal.  Nose: Nose normal.  Mouth/Throat: Oropharynx is clear and moist.  Eyes: Conjunctivae are normal. Pupils are equal, round, and reactive to light. Right eye exhibits no discharge. Left eye exhibits no discharge.  Neck: Normal range of motion. Neck supple. No JVD present. No tracheal deviation present. No thyromegaly present.  Cardiovascular: Normal rate, regular rhythm and normal heart sounds.   Pulmonary/Chest: No stridor. No respiratory distress. She has no wheezes.  Abdominal: Soft. Bowel sounds are  normal. She exhibits no distension and no mass. There is no tenderness. There is no rebound and no guarding.  Musculoskeletal: She exhibits no edema or tenderness.  Lymphadenopathy:    She has no cervical adenopathy.  Neurological: She displays normal reflexes. No cranial nerve deficit. She exhibits normal muscle tone. Coordination normal.  Skin: No rash noted. No erythema.  Psychiatric: She has a normal mood and affect. Her behavior is normal. Judgment and thought content normal.  Obese LS less  painful Str leg elev (-) B intertrigo  Lab Results  Component Value Date   WBC 8.4 02/09/2016   HGB 13.3 02/09/2016   HCT 41.1 02/09/2016   PLT 221 02/09/2016   GLUCOSE 120 (H) 02/09/2016   CHOL 183 04/21/2014   TRIG 155.0 (H) 04/21/2014   HDL 44.00 04/21/2014   LDLCALC 108 (H) 04/21/2014   ALT 18 02/09/2016   AST 21 02/09/2016   NA 139 02/09/2016   K 4.4 02/09/2016   CL 108 02/09/2016   CREATININE 0.76 02/09/2016   BUN 13 02/09/2016   CO2 24 02/09/2016   TSH 1.07 05/15/2015   HGBA1C 6.1 10/19/2015    Ct Abdomen Pelvis W Contrast  Result Date: 02/09/2016 CLINICAL DATA:  Right lower quadrant abdominal pain for 2-3 days. Diarrhea. EXAM: CT ABDOMEN AND PELVIS WITH CONTRAST TECHNIQUE: Multidetector CT imaging of the abdomen and pelvis was performed using the standard protocol following bolus administration of intravenous contrast. CONTRAST:  100 ISOVUE-300 IOPAMIDOL (ISOVUE-300) INJECTION 61% COMPARISON:  CT abdomen and pelvis 08/22/2015. FINDINGS: Lower chest: Bibasilar scarring is similar the prior study. No active disease is present. The heart is enlarged. There is no significant pleural or pericardial effusion. Hepatobiliary: Mild intra and extrahepatic biliary dilation is stable following cholecystectomy. No focal hepatic lesions are present. Pancreas: No focal inflammatory changes are present. There is no mass lesion or cyst no ductal dilation is present. 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 A punctate nonobstructing stone at the lower pole of the right kidney is stable. The ureters are within normal limits bilaterally. The urinary bladder is mostly collapsed. Stomach/Bowel: Stomach and duodenum are within normal limits. The small bowel is unremarkable. The ileocecal valve is within normal limits. The appendix is visualized and normal. The ascending and transverse colon are within normal limits. The descending and rectosigmoid  colon are unremarkable. Vascular/Lymphatic: Scattered atherosclerotic calcifications are present without aneurysm. No significant adenopathy is present. Reproductive: Fibroid at the fundus the uterus is stable. The uterus and adnexa are otherwise unremarkable. Other: No significant free fluid is present. Musculoskeletal: Chronic endplate marrow changes are present at L5-S1 bilaterally, worse on the left. Foraminal narrowing is present bilaterally have all levels from L2-3 through L5-S1. There is a vacuum disc at L4-5. No focal lytic or blastic lesions are present. There straightening of the normal lumbar lordosis. The pelvis is intact. Degenerative changes are noted at the SI joint. A homogeneous subcutaneous soft tissue lease in is noted posterior to the right hip. IMPRESSION: 1. No acute or focal abnormality to explain the patient's right lower quadrant abdominal pain. 2. Normal appearance of the appendix and ileocecal valve. 3. Punctate nonobstructing stone at the lower pole of the right kidney is stable. 4. Atherosclerosis. 5. Uterine fibroid. 6. Moderate spondylosis of the lumbar spine. 7. Stable subcutaneous soft tissue lesion posterior to the left hip. This is nonspecific, but likely benign. It demonstrates very slow growth over time. Please correlate with exam. Electronically Signed  By: San Morelle M.D.   On: 02/09/2016 19:35   Assessment & Plan:   There are no diagnoses linked to this encounter. I am having Ms. Vivian maintain her Vitamin D3, metoprolol succinate, umeclidinium-vilanterol, meclizine, ketoconazole, ibuprofen, predniSONE, HYDROcodone-acetaminophen, and ALPRAZolam.  No orders of the defined types were placed in this encounter.    Follow-up: No Follow-up on file.  Walker Kehr, MD

## 2016-06-27 ENCOUNTER — Encounter: Payer: Self-pay | Admitting: Internal Medicine

## 2016-06-27 ENCOUNTER — Ambulatory Visit (INDEPENDENT_AMBULATORY_CARE_PROVIDER_SITE_OTHER): Payer: Medicare Other | Admitting: Internal Medicine

## 2016-06-27 VITALS — BP 130/60 | HR 77 | Wt 200.0 lb

## 2016-06-27 DIAGNOSIS — E785 Hyperlipidemia, unspecified: Secondary | ICD-10-CM

## 2016-06-27 DIAGNOSIS — E119 Type 2 diabetes mellitus without complications: Secondary | ICD-10-CM | POA: Diagnosis not present

## 2016-06-27 DIAGNOSIS — I10 Essential (primary) hypertension: Secondary | ICD-10-CM

## 2016-06-27 DIAGNOSIS — G8929 Other chronic pain: Secondary | ICD-10-CM

## 2016-06-27 DIAGNOSIS — M5440 Lumbago with sciatica, unspecified side: Secondary | ICD-10-CM

## 2016-06-27 DIAGNOSIS — Z23 Encounter for immunization: Secondary | ICD-10-CM | POA: Diagnosis not present

## 2016-06-27 DIAGNOSIS — F4323 Adjustment disorder with mixed anxiety and depressed mood: Secondary | ICD-10-CM

## 2016-06-27 DIAGNOSIS — R1031 Right lower quadrant pain: Secondary | ICD-10-CM

## 2016-06-27 MED ORDER — ALPRAZOLAM 0.5 MG PO TABS: 0.5000 mg | ORAL_TABLET | Freq: Two times a day (BID) | ORAL | 2 refills | Status: DC | PRN

## 2016-06-27 MED ORDER — HYDROCODONE-ACETAMINOPHEN 7.5-325 MG PO TABS: 1.0000 | ORAL_TABLET | Freq: Three times a day (TID) | ORAL | 0 refills | Status: DC | PRN

## 2016-06-27 NOTE — Assessment & Plan Note (Signed)
On diet Lost wt

## 2016-06-27 NOTE — Progress Notes (Signed)
Subjective:  Patient ID: Amanda Arnold, female    DOB: [birth year]  Age: 70 y.o. MRN: [id number]  CC: No chief complaint on file.   HPI Amanda Arnold presents for LBP, anxiety, HTN f/u  Outpatient Medications Prior to Visit  Medication Sig Dispense Refill  . ALPRAZolam (XANAX) 0.5 MG tablet Take 1 tablet (0.5 mg total) by mouth 2 (two) times daily as needed for anxiety. 60 tablet 2  . Cholecalciferol (VITAMIN D3) 2000 UNITS capsule Take 1 capsule (2,000 Units total) by mouth daily. 100 capsule 3  . HYDROcodone-acetaminophen (NORCO) 7.5-325 MG tablet Take 1 tablet by mouth every 8 (eight) hours as needed for severe pain. 90 tablet 0  . metoprolol succinate (TOPROL-XL) 25 MG 24 hr tablet Take 0.5 tablets (12.5 mg total) by mouth at bedtime. 90 tablet 3  . ibuprofen (ADVIL,MOTRIN) 800 MG tablet Take 1 tablet (800 mg total) by mouth every 8 (eight) hours as needed. (Patient not taking: Reported on 06/27/2016) 21 tablet 0  . ketoconazole (NIZORAL) 2 % cream Apply 1 application topically 2 (two) times daily. In the groin (Patient not taking: Reported on 06/27/2016) 60 g 1  . meclizine (ANTIVERT) 12.5 MG tablet Take 1-2 tablets (12.5-25 mg total) by mouth 3 (three) times daily as needed for dizziness. (Patient not taking: Reported on 06/27/2016) 60 tablet 1  . pramipexole (MIRAPEX) 0.125 MG tablet Take 1 tablet (0.125 mg total) by mouth at bedtime. (Patient not taking: Reported on 06/27/2016) 30 tablet 5  . umeclidinium-vilanterol (ANORO ELLIPTA) 62.5-25 MCG/INH AEPB Inhale 1 puff into the lungs daily. (Patient not taking: Reported on 06/27/2016) 1 each 11   No facility-administered medications prior to visit.     ROS Review of Systems  Constitutional: Negative for activity change, appetite change, chills, fatigue and unexpected weight change.  HENT: Negative for congestion, mouth sores and sinus pressure.   Eyes: Negative for visual disturbance.  Respiratory: Negative for cough and chest  tightness.   Gastrointestinal: Negative for abdominal pain and nausea.  Genitourinary: Negative for difficulty urinating, frequency and vaginal pain.  Musculoskeletal: Positive for back pain. Negative for gait problem.  Skin: Negative for pallor and rash.  Neurological: Negative for dizziness, tremors, weakness, numbness and headaches.  Psychiatric/Behavioral: Negative for confusion and sleep disturbance. The patient is nervous/anxious.     Objective:  BP 130/60   Pulse 77   Wt 200 lb (90.7 kg)   SpO2 95%   BMI 36.58 kg/m   BP Readings from Last 3 Encounters:  06/27/16 130/60  03/28/16 (!) 112/58  03/12/16 130/80    Wt Readings from Last 3 Encounters:  06/27/16 200 lb (90.7 kg)  03/28/16 203 lb (92.1 kg)  03/12/16 201 lb (91.2 kg)    Physical Exam  Constitutional: She appears well-developed. No distress.  HENT:  Head: Normocephalic.  Right Ear: External ear normal.  Left Ear: External ear normal.  Nose: Nose normal.  Mouth/Throat: Oropharynx is clear and moist.  Eyes: Conjunctivae are normal. Pupils are equal, round, and reactive to light. Right eye exhibits no discharge. Left eye exhibits no discharge.  Neck: Normal range of motion. Neck supple. No JVD present. No tracheal deviation present. No thyromegaly present.  Cardiovascular: Normal rate, regular rhythm and normal heart sounds.   Pulmonary/Chest: No stridor. No respiratory distress. She has no wheezes.  Abdominal: Soft. Bowel sounds are normal. She exhibits no distension and no mass. There is no tenderness. There is no rebound and no guarding.  Musculoskeletal: She  exhibits tenderness. She exhibits no edema.  Lymphadenopathy:    She has no cervical adenopathy.  Neurological: She displays normal reflexes. No cranial nerve deficit. She exhibits normal muscle tone. Coordination normal.  Skin: No rash noted. No erythema.  Psychiatric: She has a normal mood and affect. Her behavior is normal. Judgment and thought  content normal.    Lab Results  Component Value Date   WBC 8.4 02/09/2016   HGB 13.3 02/09/2016   HCT 41.1 02/09/2016   PLT 221 02/09/2016   GLUCOSE 120 (H) 02/09/2016   CHOL 183 04/21/2014   TRIG 155.0 (H) 04/21/2014   HDL 44.00 04/21/2014   LDLCALC 108 (H) 04/21/2014   ALT 18 02/09/2016   AST 21 02/09/2016   NA 139 02/09/2016   K 4.4 02/09/2016   CL 108 02/09/2016   CREATININE 0.76 02/09/2016   BUN 13 02/09/2016   CO2 24 02/09/2016   TSH 1.07 05/15/2015   HGBA1C 6.1 10/19/2015    Ct Abdomen Pelvis W Contrast  Result Date: 02/09/2016 CLINICAL DATA:  Right lower quadrant abdominal pain for 2-3 days. Diarrhea. EXAM: CT ABDOMEN AND PELVIS WITH CONTRAST TECHNIQUE: Multidetector CT imaging of the abdomen and pelvis was performed using the standard protocol following bolus administration of intravenous contrast. CONTRAST:  100 ISOVUE-300 IOPAMIDOL (ISOVUE-300) INJECTION 61% COMPARISON:  CT abdomen and pelvis 08/22/2015. FINDINGS: Lower chest: Bibasilar scarring is similar the prior study. No active disease is present. The heart is enlarged. There is no significant pleural or pericardial effusion. Hepatobiliary: Mild intra and extrahepatic biliary dilation is stable following cholecystectomy. No focal hepatic lesions are present. Pancreas: No focal inflammatory changes are present. There is no mass lesion or cyst no ductal dilation is present. 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 A punctate nonobstructing stone at the lower pole of the right kidney is stable. The ureters are within normal limits bilaterally. The urinary bladder is mostly collapsed. Stomach/Bowel: Stomach and duodenum are within normal limits. The small bowel is unremarkable. The ileocecal valve is within normal limits. The appendix is visualized and normal. The ascending and transverse colon are within normal limits. The descending and rectosigmoid colon are unremarkable.  Vascular/Lymphatic: Scattered atherosclerotic calcifications are present without aneurysm. No significant adenopathy is present. Reproductive: Fibroid at the fundus the uterus is stable. The uterus and adnexa are otherwise unremarkable. Other: No significant free fluid is present. Musculoskeletal: Chronic endplate marrow changes are present at L5-S1 bilaterally, worse on the left. Foraminal narrowing is present bilaterally have all levels from L2-3 through L5-S1. There is a vacuum disc at L4-5. No focal lytic or blastic lesions are present. There straightening of the normal lumbar lordosis. The pelvis is intact. Degenerative changes are noted at the SI joint. A homogeneous subcutaneous soft tissue lease in is noted posterior to the right hip. IMPRESSION: 1. No acute or focal abnormality to explain the patient's right lower quadrant abdominal pain. 2. Normal appearance of the appendix and ileocecal valve. 3. Punctate nonobstructing stone at the lower pole of the right kidney is stable. 4. Atherosclerosis. 5. Uterine fibroid. 6. Moderate spondylosis of the lumbar spine. 7. Stable subcutaneous soft tissue lesion posterior to the left hip. This is nonspecific, but likely benign. It demonstrates very slow growth over time. Please correlate with exam. Electronically Signed   By: San Morelle M.D.   On: 02/09/2016 19:35   Assessment & Plan:   Diagnoses and all orders for this visit:  Right lower quadrant pain -  ALPRAZolam (XANAX) 0.5 MG tablet; Take 1 tablet (0.5 mg total) by mouth 2 (two) times daily as needed for anxiety. -     HYDROcodone-acetaminophen (NORCO) 7.5-325 MG tablet; Take 1 tablet by mouth every 8 (eight) hours as needed for severe pain.  Controlled type 2 diabetes mellitus without complication, without long-term current use of insulin (HCC) -     ALPRAZolam (XANAX) 0.5 MG tablet; Take 1 tablet (0.5 mg total) by mouth 2 (two) times daily as needed for anxiety.   I am having Ms.  Towery maintain her Vitamin D3, umeclidinium-vilanterol, meclizine, ketoconazole, ibuprofen, HYDROcodone-acetaminophen, ALPRAZolam, pramipexole, and metoprolol succinate.  No orders of the defined types were placed in this encounter.    Follow-up: No Follow-up on file.  Walker Kehr, MD

## 2016-06-27 NOTE — Addendum Note (Signed)
Addended by: Cresenciano Lick on: 06/27/2016 03:32 PM   Modules accepted: Orders

## 2016-06-27 NOTE — Assessment & Plan Note (Signed)
norco prn  Potential benefits of a long term opioids use as well as potential risks (i.e. addiction risk, apnea etc) and complications (i.e. Somnolence, constipation and others) were explained to the patient and were aknowledged. 

## 2016-06-27 NOTE — Assessment & Plan Note (Signed)
Chronic  Xanax prn  Potential benefits of a long term benzodiazepines  use as well as potential risks  and complications were explained to the patient and were aknowledged. 

## 2016-06-27 NOTE — Progress Notes (Signed)
Pre visit review using our clinic review tool, if applicable. No additional management support is needed unless otherwise documented below in the visit note. 

## 2016-06-27 NOTE — Assessment & Plan Note (Signed)
Toprol XL 

## 2016-06-30 ENCOUNTER — Other Ambulatory Visit (INDEPENDENT_AMBULATORY_CARE_PROVIDER_SITE_OTHER): Payer: Medicare Other

## 2016-06-30 DIAGNOSIS — IMO0001 Reserved for inherently not codable concepts without codable children: Secondary | ICD-10-CM

## 2016-06-30 DIAGNOSIS — I10 Essential (primary) hypertension: Secondary | ICD-10-CM

## 2016-06-30 DIAGNOSIS — R05 Cough: Secondary | ICD-10-CM

## 2016-06-30 DIAGNOSIS — M545 Low back pain, unspecified: Secondary | ICD-10-CM

## 2016-06-30 DIAGNOSIS — R739 Hyperglycemia, unspecified: Secondary | ICD-10-CM

## 2016-06-30 DIAGNOSIS — E785 Hyperlipidemia, unspecified: Secondary | ICD-10-CM | POA: Diagnosis not present

## 2016-06-30 DIAGNOSIS — E119 Type 2 diabetes mellitus without complications: Secondary | ICD-10-CM

## 2016-06-30 DIAGNOSIS — N3090 Cystitis, unspecified without hematuria: Secondary | ICD-10-CM

## 2016-06-30 DIAGNOSIS — R059 Cough, unspecified: Secondary | ICD-10-CM

## 2016-06-30 LAB — CBC WITH DIFFERENTIAL/PLATELET
Basophils Absolute: 0.1 10*3/uL (ref 0.0–0.1)
Basophils Relative: 0.7 % (ref 0.0–3.0)
Eosinophils Absolute: 0.2 10*3/uL (ref 0.0–0.7)
Eosinophils Relative: 2.4 % (ref 0.0–5.0)
HCT: 40.9 % (ref 36.0–46.0)
Hemoglobin: 13.9 g/dL (ref 12.0–15.0)
Lymphocytes Relative: 43.5 % (ref 12.0–46.0)
Lymphs Abs: 3.4 10*3/uL (ref 0.7–4.0)
MCHC: 34.1 g/dL (ref 30.0–36.0)
MCV: 90.4 fl (ref 78.0–100.0)
Monocytes Absolute: 0.6 10*3/uL (ref 0.1–1.0)
Monocytes Relative: 8.3 % (ref 3.0–12.0)
Neutro Abs: 3.5 10*3/uL (ref 1.4–7.7)
Neutrophils Relative %: 45.1 % (ref 43.0–77.0)
Platelets: 221 10*3/uL (ref 150.0–400.0)
RBC: 4.52 Mil/uL (ref 3.87–5.11)
RDW: 12.8 % (ref 11.5–15.5)
WBC: 7.8 10*3/uL (ref 4.0–10.5)

## 2016-06-30 LAB — LIPID PANEL
Cholesterol: 148 mg/dL (ref 0–200)
HDL: 47.3 mg/dL (ref >39.00)
LDL Cholesterol: 63 mg/dL (ref 0–99)
NonHDL: 100.24
Total CHOL/HDL Ratio: 3
Triglycerides: 185 mg/dL — ABNORMAL HIGH (ref 0.0–149.0)
VLDL: 37 mg/dL (ref 0.0–40.0)

## 2016-06-30 LAB — HEPATIC FUNCTION PANEL
ALT: 16 U/L (ref 0–35)
AST: 18 U/L (ref 0–37)
Albumin: 4.1 g/dL (ref 3.5–5.2)
Alkaline Phosphatase: 90 U/L (ref 39–117)
Bilirubin, Direct: 0.1 mg/dL (ref 0.0–0.3)
Total Bilirubin: 0.4 mg/dL (ref 0.2–1.2)
Total Protein: 6.5 g/dL (ref 6.0–8.3)

## 2016-06-30 LAB — URINALYSIS, ROUTINE W REFLEX MICROSCOPIC
Bilirubin Urine: NEGATIVE
Hgb urine dipstick: NEGATIVE
Ketones, ur: NEGATIVE
Nitrite: NEGATIVE
RBC / HPF: NONE SEEN (ref 0–?)
Specific Gravity, Urine: >=1.03 — AB (ref 1.000–1.030)
Total Protein, Urine: NEGATIVE
Urine Glucose: NEGATIVE
Urobilinogen, UA: 0.2 (ref 0.0–1.0)
pH: 5.5 (ref 5.0–8.0)

## 2016-06-30 LAB — BASIC METABOLIC PANEL
BUN: 19 mg/dL (ref 6–23)
CO2: 27 mEq/L (ref 19–32)
Calcium: 9.1 mg/dL (ref 8.4–10.5)
Chloride: 105 mEq/L (ref 96–112)
Creatinine, Ser: 0.7 mg/dL (ref 0.40–1.20)
GFR: 88.4 mL/min (ref >60.00)
Glucose, Bld: 128 mg/dL — ABNORMAL HIGH (ref 70–99)
Potassium: 4.6 mEq/L (ref 3.5–5.1)
Sodium: 140 mEq/L (ref 135–145)

## 2016-06-30 LAB — TSH: TSH: 1.9 u[IU]/mL (ref 0.35–4.50)

## 2016-06-30 LAB — HEMOGLOBIN A1C: Hgb A1c MFr Bld: 6.1 % (ref 4.6–6.5)

## 2016-06-30 MED ORDER — CIPROFLOXACIN HCL 250 MG PO TABS: 250.0000 mg | ORAL_TABLET | Freq: Two times a day (BID) | ORAL | 0 refills | Status: DC

## 2016-07-18 ENCOUNTER — Ambulatory Visit (INDEPENDENT_AMBULATORY_CARE_PROVIDER_SITE_OTHER): Payer: Medicare Other | Admitting: Internal Medicine

## 2016-07-18 ENCOUNTER — Encounter: Payer: Self-pay | Admitting: Internal Medicine

## 2016-07-18 ENCOUNTER — Other Ambulatory Visit (INDEPENDENT_AMBULATORY_CARE_PROVIDER_SITE_OTHER): Payer: Medicare Other

## 2016-07-18 ENCOUNTER — Other Ambulatory Visit: Payer: Self-pay | Admitting: Internal Medicine

## 2016-07-18 VITALS — BP 128/68 | HR 68 | Temp 97.9°F | Wt 203.0 lb

## 2016-07-18 DIAGNOSIS — M5430 Sciatica, unspecified side: Secondary | ICD-10-CM

## 2016-07-18 DIAGNOSIS — R358 Other polyuria: Secondary | ICD-10-CM

## 2016-07-18 DIAGNOSIS — N390 Urinary tract infection, site not specified: Secondary | ICD-10-CM

## 2016-07-18 DIAGNOSIS — I10 Essential (primary) hypertension: Secondary | ICD-10-CM

## 2016-07-18 DIAGNOSIS — H6123 Impacted cerumen, bilateral: Secondary | ICD-10-CM

## 2016-07-18 DIAGNOSIS — N3090 Cystitis, unspecified without hematuria: Secondary | ICD-10-CM

## 2016-07-18 DIAGNOSIS — R3589 Other polyuria: Secondary | ICD-10-CM

## 2016-07-18 DIAGNOSIS — Z1231 Encounter for screening mammogram for malignant neoplasm of breast: Secondary | ICD-10-CM

## 2016-07-18 LAB — URINALYSIS, ROUTINE W REFLEX MICROSCOPIC
Bilirubin Urine: NEGATIVE
Hgb urine dipstick: NEGATIVE
Ketones, ur: NEGATIVE
Nitrite: NEGATIVE
Specific Gravity, Urine: >=1.03 — AB (ref 1.000–1.030)
Total Protein, Urine: NEGATIVE
Urine Glucose: NEGATIVE
Urobilinogen, UA: 0.2 (ref 0.0–1.0)
pH: 5.5 (ref 5.0–8.0)

## 2016-07-18 MED ORDER — TOLTERODINE TARTRATE ER 4 MG PO CP24: 4.0000 mg | ORAL_CAPSULE | Freq: Every day | ORAL | 11 refills | Status: DC

## 2016-07-18 NOTE — Progress Notes (Signed)
Subjective:  Patient ID: Amanda Arnold, female    DOB: [birth year]  Age: 71 y.o. MRN: [id number]  CC: Abdominal Pain; Dysuria; and Urinary Frequency   HPI Amanda Arnold presents for abd pain and frequent urination x 3-4 weeks. The pt took Cipro - felt better for a while C/o R ear pain F/u LBP  Outpatient Medications Prior to Visit  Medication Sig Dispense Refill  . ALPRAZolam (XANAX) 0.5 MG tablet Take 1 tablet (0.5 mg total) by mouth 2 (two) times daily as needed for anxiety. 60 tablet 2  . Cholecalciferol (VITAMIN D3) 2000 UNITS capsule Take 1 capsule (2,000 Units total) by mouth daily. 100 capsule 3  . HYDROcodone-acetaminophen (NORCO) 7.5-325 MG tablet Take 1 tablet by mouth every 8 (eight) hours as needed for severe pain. 90 tablet 0  . ibuprofen (ADVIL,MOTRIN) 800 MG tablet Take 1 tablet (800 mg total) by mouth every 8 (eight) hours as needed. 21 tablet 0  . ketoconazole (NIZORAL) 2 % cream Apply 1 application topically 2 (two) times daily. In the groin 60 g 1  . meclizine (ANTIVERT) 12.5 MG tablet Take 1-2 tablets (12.5-25 mg total) by mouth 3 (three) times daily as needed for dizziness. 60 tablet 1  . metoprolol succinate (TOPROL-XL) 25 MG 24 hr tablet Take 0.5 tablets (12.5 mg total) by mouth at bedtime. 90 tablet 3  . pramipexole (MIRAPEX) 0.125 MG tablet Take 1 tablet (0.125 mg total) by mouth at bedtime. 30 tablet 5  . umeclidinium-vilanterol (ANORO ELLIPTA) 62.5-25 MCG/INH AEPB Inhale 1 puff into the lungs daily. 1 each 11  . ciprofloxacin (CIPRO) 250 MG tablet Take 1 tablet (250 mg total) by mouth 2 (two) times daily. 10 tablet 0   No facility-administered medications prior to visit.     ROS Review of Systems  Constitutional: Negative for activity change, appetite change, chills, fatigue and unexpected weight change.  HENT: Negative for congestion, mouth sores and sinus pressure.   Eyes: Negative for visual disturbance.  Respiratory: Negative for cough and chest  tightness.   Gastrointestinal: Negative for abdominal pain and nausea.  Genitourinary: Positive for dysuria. Negative for difficulty urinating, frequency and vaginal pain.  Musculoskeletal: Negative for back pain and gait problem.  Skin: Negative for pallor and rash.  Neurological: Negative for dizziness, tremors, weakness, numbness and headaches.  Psychiatric/Behavioral: Negative for confusion and sleep disturbance.    Objective:  BP 128/68   Pulse 68   Temp 97.9 F (36.6 C) (Oral)   Wt 203 lb (92.1 kg)   SpO2 95%   BMI 37.13 kg/m   BP Readings from Last 3 Encounters:  07/18/16 128/68  06/27/16 130/60  03/28/16 (!) 112/58    Wt Readings from Last 3 Encounters:  07/18/16 203 lb (92.1 kg)  06/27/16 200 lb (90.7 kg)  03/28/16 203 lb (92.1 kg)    Physical Exam  Constitutional: She appears well-developed. No distress.  HENT:  Head: Normocephalic.  Right Ear: External ear normal.  Left Ear: External ear normal.  Nose: Nose normal.  Mouth/Throat: Oropharynx is clear and moist.  Eyes: Conjunctivae are normal. Pupils are equal, round, and reactive to light. Right eye exhibits no discharge. Left eye exhibits no discharge.  Neck: Normal range of motion. Neck supple. No JVD present. No tracheal deviation present. No thyromegaly present.  Cardiovascular: Normal rate, regular rhythm and normal heart sounds.   Pulmonary/Chest: No stridor. No respiratory distress. She has no wheezes.  Abdominal: Soft. Bowel sounds are normal. She exhibits no  distension and no mass. There is no tenderness. There is no rebound and no guarding.  Musculoskeletal: She exhibits no edema or tenderness.  Lymphadenopathy:    She has no cervical adenopathy.  Neurological: She displays normal reflexes. No cranial nerve deficit. She exhibits normal muscle tone. Coordination normal.  Skin: No rash noted. No erythema.  Psychiatric: She has a normal mood and affect. Her behavior is normal. Judgment and thought  content normal.  Obese LS tender B wax R>L   Procedure Note :     Procedure :  Ear irrigation   Indication:  Cerumen impaction   Risks, including pain, dizziness, eardrum perforation, bleeding, infection and others as well as benefits were explained to the patient in detail. Verbal consent was obtained and the patient agreed to proceed.    We used "The Elephant Ear Irrigation Device" filled with lukewarm water for irrigation. A large amount wax was recovered. Procedure has also required manual wax removal with an ear loop.   Tolerated well. Complications: None.   Postprocedure instructions :  Call if problems.    Lab Results  Component Value Date   WBC 7.8 06/30/2016   HGB 13.9 06/30/2016   HCT 40.9 06/30/2016   PLT 221.0 06/30/2016   GLUCOSE 128 (H) 06/30/2016   CHOL 148 06/30/2016   TRIG 185.0 (H) 06/30/2016   HDL 47.30 06/30/2016   LDLCALC 63 06/30/2016   ALT 16 06/30/2016   AST 18 06/30/2016   NA 140 06/30/2016   K 4.6 06/30/2016   CL 105 06/30/2016   CREATININE 0.70 06/30/2016   BUN 19 06/30/2016   CO2 27 06/30/2016   TSH 1.90 06/30/2016   HGBA1C 6.1 06/30/2016    Ct Abdomen Pelvis W Contrast  Result Date: 02/09/2016 CLINICAL DATA:  Right lower quadrant abdominal pain for 2-3 days. Diarrhea. EXAM: CT ABDOMEN AND PELVIS WITH CONTRAST TECHNIQUE: Multidetector CT imaging of the abdomen and pelvis was performed using the standard protocol following bolus administration of intravenous contrast. CONTRAST:  100 ISOVUE-300 IOPAMIDOL (ISOVUE-300) INJECTION 61% COMPARISON:  CT abdomen and pelvis 08/22/2015. FINDINGS: Lower chest: Bibasilar scarring is similar the prior study. No active disease is present. The heart is enlarged. There is no significant pleural or pericardial effusion. Hepatobiliary: Mild intra and extrahepatic biliary dilation is stable following cholecystectomy. No focal hepatic lesions are present. Pancreas: No focal inflammatory changes are present. There  is no mass lesion or cyst no ductal dilation is present. 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 A punctate nonobstructing stone at the lower pole of the right kidney is stable. The ureters are within normal limits bilaterally. The urinary bladder is mostly collapsed. Stomach/Bowel: Stomach and duodenum are within normal limits. The small bowel is unremarkable. The ileocecal valve is within normal limits. The appendix is visualized and normal. The ascending and transverse colon are within normal limits. The descending and rectosigmoid colon are unremarkable. Vascular/Lymphatic: Scattered atherosclerotic calcifications are present without aneurysm. No significant adenopathy is present. Reproductive: Fibroid at the fundus the uterus is stable. The uterus and adnexa are otherwise unremarkable. Other: No significant free fluid is present. Musculoskeletal: Chronic endplate marrow changes are present at L5-S1 bilaterally, worse on the left. Foraminal narrowing is present bilaterally have all levels from L2-3 through L5-S1. There is a vacuum disc at L4-5. No focal lytic or blastic lesions are present. There straightening of the normal lumbar lordosis. The pelvis is intact. Degenerative changes are noted at the SI joint. A homogeneous subcutaneous  soft tissue lease in is noted posterior to the right hip. IMPRESSION: 1. No acute or focal abnormality to explain the patient's right lower quadrant abdominal pain. 2. Normal appearance of the appendix and ileocecal valve. 3. Punctate nonobstructing stone at the lower pole of the right kidney is stable. 4. Atherosclerosis. 5. Uterine fibroid. 6. Moderate spondylosis of the lumbar spine. 7. Stable subcutaneous soft tissue lesion posterior to the left hip. This is nonspecific, but likely benign. It demonstrates very slow growth over time. Please correlate with exam. Electronically Signed   By: San Morelle M.D.   On:  02/09/2016 19:35   Assessment & Plan:   There are no diagnoses linked to this encounter. I have discontinued Ms. Demarest's ciprofloxacin. I am also having her maintain her Vitamin D3, umeclidinium-vilanterol, meclizine, ketoconazole, ibuprofen, pramipexole, metoprolol succinate, ALPRAZolam, and HYDROcodone-acetaminophen.  No orders of the defined types were placed in this encounter.    Follow-up: No Follow-up on file.  Walker Kehr, MD

## 2016-07-18 NOTE — Assessment & Plan Note (Signed)
Detrol LA qd

## 2016-07-18 NOTE — Assessment & Plan Note (Signed)
See procedure 

## 2016-07-18 NOTE — Progress Notes (Signed)
Pre visit review using our clinic review tool, if applicable. No additional management support is needed unless otherwise documented below in the visit note. 

## 2016-07-18 NOTE — Assessment & Plan Note (Signed)
UA, Cx 

## 2016-07-18 NOTE — Assessment & Plan Note (Signed)
Took Cipro UA, Cx

## 2016-07-18 NOTE — Assessment & Plan Note (Signed)
BP Readings from Last 3 Encounters:  07/18/16 128/68  06/27/16 130/60  03/28/16 (!) 112/58

## 2016-07-18 NOTE — Assessment & Plan Note (Signed)
Norco prn  Potential benefits of a long term opioids use as well as potential risks (i.e. addiction risk, apnea etc) and complications (i.e. Somnolence, constipation and others) were explained to the patient and were aknowledged. 

## 2016-07-21 LAB — CULTURE, URINE COMPREHENSIVE

## 2016-08-06 DIAGNOSIS — L720 Epidermal cyst: Secondary | ICD-10-CM | POA: Diagnosis not present

## 2016-08-22 ENCOUNTER — Ambulatory Visit
Admission: RE | Admit: 2016-08-22 | Discharge: 2016-08-22 | Disposition: A | Discharge status: Home / Self Care | Payer: Medicare Other | Source: Ambulatory Visit | Attending: Internal Medicine | Admitting: Internal Medicine

## 2016-08-22 DIAGNOSIS — Z1231 Encounter for screening mammogram for malignant neoplasm of breast: Secondary | ICD-10-CM

## 2016-09-11 DIAGNOSIS — R102 Pelvic and perineal pain: Secondary | ICD-10-CM | POA: Diagnosis not present

## 2016-09-11 DIAGNOSIS — L293 Anogenital pruritus, unspecified: Secondary | ICD-10-CM | POA: Diagnosis not present

## 2016-09-17 DIAGNOSIS — R222 Localized swelling, mass and lump, trunk: Secondary | ICD-10-CM | POA: Diagnosis not present

## 2016-09-23 DIAGNOSIS — R1031 Right lower quadrant pain: Secondary | ICD-10-CM | POA: Diagnosis not present

## 2016-09-25 NOTE — Progress Notes (Signed)
Pre visit review using our clinic review tool, if applicable. No additional management support is needed unless otherwise documented below in the visit note. 

## 2016-09-25 NOTE — Progress Notes (Signed)
Subjective:   Amanda Arnold is a 71 y.o. female who presents for Medicare Annual (Subsequent) preventive examination.  Review of Systems:  No ROS.  Medicare Wellness Visit.  Cardiac Risk Factors include: advanced age (>35men, >84 women);diabetes mellitus;hypertension Sleep patterns: has interrupted sleep, is not rested upon awakening, gets up 2 times nightly to void and sleeps 4-6 hours nightly.    Reports poor sleep due to cyst on buttocks area causing pain and discomfort, sleep tips discussed with patient.  Home Safety/Smoke Alarms:  Feels safe in home. Smoke alarms in place.   Living environment; residence and Firearm Safety: 1-story house/ trailer, no firearms. Lives with husband Seat Belt Safety/Bike Helmet: Wears seat belt.   Counseling:   Eye Exam- reports she goes yearly Dental- reports not recently, resources given   Female:   Pap- N/A      Mammo- Last 08/21/16, BI-RADS CATEGORY  1: Negative     Dexa scan- referral placed today   CCS- Last 08/28/14, 2 Polyps, diverticulosis, recall 5 years      Objective:     Vitals: BP 130/62   Pulse 69   Temp 98.4 F (36.9 C) (Oral)   Resp 16   Ht 5\' 2"  (1.575 m)   Wt 203 lb 8 oz (92.3 kg)   SpO2 98%   BMI 37.22 kg/m   Body mass index is 37.22 kg/m.   Tobacco History  Smoking Status  . Former Smoker  . Quit date: 07/10/2013  Smokeless Tobacco  . Current User  . Types: Snuff    Comment: smoked 1965-2014, up to 1 ppd     Ready to quit: Not Answered Counseling given: Not Answered   Past Medical History:  Diagnosis Date  . Abdominal pain, epigastric 12/18/2009  . ANXIETY 05/25/2006  . BRONCHITIS, ACUTE 12/08/2007  . COPD 05/25/2006  . DEPRESSION 01/02/2006  . Diabetes mellitus without complication ([place])   . ESOTROPIA, LEFT EYE 12/02/47  . FIBROMYALGIA 04/02/2006  . Headache(784.0) 12/08/2007  . HYPERGLYCEMIA 05/15/2010  . HYPERLIPIDEMIA 04/30/2006  . HYPERTENSION 05/25/2006  . Hypertension   . INSOMNIA,  PERSISTENT 02/22/2008  . Lazy eye    left  . LOW BACK PAIN 01/27/2007  . Migraines   . 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 OBSTRUCTIVE SLEEP APNEA 04/17/2010  . OSTEOARTHRITIS 05/25/2006  . Polyuria   . Psychosomatic disease 2011  . 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 T M J 05/11/2009   Past Surgical History:  Procedure Laterality Date  . CHOLECYSTECTOMY     Family History  Problem Relation Age of Onset  . Heart attack Father   . Hypertension Brother   . Heart disease Mother   . Colon cancer Neg Hx   . Stomach cancer Neg Hx    History  Sexual Activity  . Sexual activity: Not Currently    Outpatient Encounter Prescriptions as of 09/26/2016  Medication Sig  . ALPRAZolam (XANAX) 0.5 MG tablet Take 1 tablet (0.5 mg total) by mouth 2 (two) times daily as needed for anxiety.  . Cholecalciferol (VITAMIN D3) 2000 UNITS capsule Take 1 capsule (2,000 Units total) by mouth daily.  Marland Kitchen HYDROcodone-acetaminophen (NORCO) 7.5-325 MG tablet Take 1 tablet by mouth every 8 (eight) hours as needed for severe pain.  Marland Kitchen ketoconazole (NIZORAL) 2 % cream Apply 1 application topically 2 (two) times daily. In the groin  . meclizine (ANTIVERT) 12.5 MG tablet Take 1-2 tablets (12.5-25 mg total) by mouth 3 (three) times daily as needed for dizziness.  Marland Kitchen  metoprolol succinate (TOPROL-XL) 25 MG 24 hr tablet Take 0.5 tablets (12.5 mg total) by mouth at bedtime.  Marland Kitchen umeclidinium-vilanterol (ANORO ELLIPTA) 62.5-25 MCG/INH AEPB Inhale 1 puff into the lungs daily.  . [DISCONTINUED] ALPRAZolam (XANAX) 0.5 MG tablet Take 1 tablet (0.5 mg total) by mouth 2 (two) times daily as needed for anxiety.  . [DISCONTINUED] HYDROcodone-acetaminophen (NORCO) 7.5-325 MG tablet Take 1 tablet by mouth every 8 (eight) hours as needed for severe pain.  . [DISCONTINUED] ibuprofen (ADVIL,MOTRIN) 800 MG tablet Take 1 tablet (800 mg total) by mouth every 8 (eight) hours as needed.  . [DISCONTINUED] pramipexole (MIRAPEX) 0.125 MG tablet Take 1  tablet (0.125 mg total) by mouth at bedtime.  . [DISCONTINUED] tolterodine (DETROL LA) 4 MG 24 hr capsule Take 1 capsule (4 mg total) by mouth daily.   No facility-administered encounter medications on file as of 09/26/2016.     Activities of Daily Living In your present state of health, do you have any difficulty performing the following activities: 09/26/2016  Hearing? N  Vision? N  Difficulty concentrating or making decisions? N  Walking or climbing stairs? N  Dressing or bathing? N  Doing errands, shopping? N  Preparing Food and eating ? N  Using the Toilet? N  In the past six months, have you accidently leaked urine? N  Do you have problems with loss of bowel control? N  Managing your Medications? N  Managing your Finances? N  Housekeeping or managing your Housekeeping? N  Some recent data might be hidden    Patient Care Team: Cassandria Anger, MD as PCP - General Erline Levine, MD as Consulting Physician (Neurosurgery)    Assessment:    Physical assessment deferred to PCP.  Exercise Activities and Dietary recommendations Current Exercise Habits: Home exercise routine, Type of exercise: walking (Reports she purposely moves around in the house to increase activity), Time (Minutes): 20, Frequency (Times/Week): 2, Weekly Exercise (Minutes/Week): 40, Intensity: Mild  Diet (meal preparation, eat out, water intake, caffeinated beverages, dairy products, fruits and vegetables): in general, a "healthy" diet  , low fat/ cholesterol, low salt Reports that she does not like to drink water, is trying to limit sugar drinks and soda.   Patient reports that she is starting to limit fat and thinking about eating healthier. Discussed flavor packets to help her increase her water intake and educated about the importance of maintaining a healthy diet.      Goals    . Eat healthier and increase water intake    . patient (pt-stated)          Book; "saying goodnight to insomnia"  Or other  sleep type workbook     . Weight < 180 lb (81.647 kg)          Plan; watches the cooking shows and weight loss programs; popcorn Foods at hs; baked kale at hs; bowl of cereal; discussed walking during commercials;  100 calories /individual serving       Fall Risk Fall Risk  09/26/2016 07/03/2015 09/22/2014  Falls in the past year? No No No   Depression Screen PHQ 2/9 Scores 09/26/2016 07/03/2015 09/22/2014 09/22/2014  PHQ - 2 Score 1 0 0 0     Cognitive Function       Ad8 score reviewed for issues:  Issues making decisions: no  Less interest in hobbies / activities: yes  Repeats questions, stories (family complaining): no  Trouble using ordinary gadgets (microwave, computer, phone): no  Forgets the month  or year: no  Mismanaging finances: no  Remembering appts: no  Daily problems with thinking and/or memory: no Ad8 score is= 1     Immunization History  Administered Date(s) Administered  . Pneumococcal Conjugate-13 06/27/2016  . Pneumococcal Polysaccharide-23 04/14/2005   Screening Tests Health Maintenance  Topic Date Due  . URINE MICROALBUMIN  04/25/1958  . DEXA SCAN  04/25/2013  . TETANUS/TDAP  10/18/2016 (Originally 04/26/1967)  . FOOT EXAM  09/26/2017 (Originally 04/25/1958)  . Hepatitis C Screening  09/26/2017 (Originally 03-22-1946)  . INFLUENZA VACCINE  10/11/2017 (Originally 02/12/2016)  . HEMOGLOBIN A1C  12/29/2016  . OPHTHALMOLOGY EXAM  01/01/2017  . PNA vac Low Risk Adult (2 of 2 - PPSV23) 06/27/2017  . MAMMOGRAM  08/22/2018  . COLONOSCOPY  08/29/2019      Plan:     Start to eat heart healthy diet (full of fruits, vegetables, whole grains, lean protein, water--limit salt, fat, and sugar intake) and increase physical activity as tolerated.  Dexa-scan referral ordered today  Healthy diet education attached to patient's AVS  During the course of the visit the patient was educated and counseled about the following appropriate screening and  preventive services:   Vaccines to include Pneumoccal, Influenza, Hepatitis B, Td, Zostavax, HCV  Cardiovascular Disease  Colorectal cancer screening  Bone density screening  Diabetes screening  Glaucoma screening  Mammography/PAP  Nutrition counseling   Patient Instructions (the written plan) was given to the patient.   Michiel Cowboy, RN  09/26/2016

## 2016-09-26 ENCOUNTER — Ambulatory Visit (INDEPENDENT_AMBULATORY_CARE_PROVIDER_SITE_OTHER): Payer: Medicare Other | Admitting: Internal Medicine

## 2016-09-26 ENCOUNTER — Encounter: Payer: Self-pay | Admitting: Internal Medicine

## 2016-09-26 VITALS — BP 130/62 | HR 69 | Temp 98.4°F | Resp 16 | Ht 62.0 in | Wt 203.5 lb

## 2016-09-26 DIAGNOSIS — Z0000 Encounter for general adult medical examination without abnormal findings: Secondary | ICD-10-CM

## 2016-09-26 DIAGNOSIS — L729 Follicular cyst of the skin and subcutaneous tissue, unspecified: Secondary | ICD-10-CM

## 2016-09-26 DIAGNOSIS — E119 Type 2 diabetes mellitus without complications: Secondary | ICD-10-CM

## 2016-09-26 DIAGNOSIS — R1031 Right lower quadrant pain: Secondary | ICD-10-CM

## 2016-09-26 DIAGNOSIS — M5440 Lumbago with sciatica, unspecified side: Secondary | ICD-10-CM | POA: Diagnosis not present

## 2016-09-26 DIAGNOSIS — E2839 Other primary ovarian failure: Secondary | ICD-10-CM

## 2016-09-26 DIAGNOSIS — F4323 Adjustment disorder with mixed anxiety and depressed mood: Secondary | ICD-10-CM | POA: Diagnosis not present

## 2016-09-26 DIAGNOSIS — G8929 Other chronic pain: Secondary | ICD-10-CM

## 2016-09-26 MED ORDER — HYDROCODONE-ACETAMINOPHEN 7.5-325 MG PO TABS: 1.0000 | ORAL_TABLET | Freq: Three times a day (TID) | ORAL | 0 refills | Status: DC | PRN

## 2016-09-26 MED ORDER — ALPRAZOLAM 0.5 MG PO TABS: 0.5000 mg | ORAL_TABLET | Freq: Two times a day (BID) | ORAL | 2 refills | Status: DC | PRN

## 2016-09-26 NOTE — Patient Instructions (Addendum)
Start to eat heart healthy diet (full of fruits, vegetables, whole grains, lean protein, water--limit salt, fat, and sugar intake) and increase physical activity as tolerated.  Amanda Arnold , Thank you for taking time to come for your Medicare Wellness Visit. I appreciate your ongoing commitment to your health goals. Please review the following plan we discussed and let me know if I can assist you in the future.   These are the goals we discussed: Goals    . Eat healthier and increase water intake    . patient (pt-stated)          Book; "saying goodnight to insomnia"  Or other sleep type workbook     . Weight < 180 lb (81.647 kg)          Plan; watches the cooking shows and weight loss programs; popcorn Foods at hs; baked kale at hs; bowl of cereal; discussed walking during commercials;  100 calories /individual serving        This is a list of the screening recommended for you and due dates:  Health Maintenance  Topic Date Due  . Urine Protein Check  04/25/1956  . DEXA scan (bone density measurement)  04/25/2013  . Tetanus Vaccine  10/18/2016*  . Complete foot exam   09/26/2017*  .  Hepatitis C: One time screening is recommended by Center for Disease Control  (CDC) for  adults born from [birth year] through 1963.   09/26/2017*  . Flu Shot  10/11/2017*  . Hemoglobin A1C  12/29/2016  . Eye exam for diabetics  01/01/2017  . Pneumonia vaccines (2 of 2 - PPSV23) 06/27/2017  . Mammogram  08/22/2018  . Colon Cancer Screening  08/29/2019  *Topic was postponed. The date shown is not the original due date.      Fat and Cholesterol Restricted Diet Getting too much fat and cholesterol in your diet may cause health problems. Following this diet helps keep your fat and cholesterol at normal levels. This can keep you from getting sick. What types of fat should I choose?  Choose monosaturated and polyunsaturated fats. These are found in foods such as olive oil, canola oil, flaxseeds, walnuts,  almonds, and seeds.  Eat more omega-3 fats. Good choices include salmon, mackerel, sardines, tuna, flaxseed oil, and ground flaxseeds.  Limit saturated fats. These are in animal products such as meats, butter, and cream. They can also be in plant products such as palm oil, palm kernel oil, and coconut oil.  Avoid foods with partially hydrogenated oils in them. These contain trans fats. Examples of foods that have trans fats are stick margarine, some tub margarines, cookies, crackers, and other baked goods. What general guidelines do I need to follow?  Check food labels. Look for the words "trans fat" and "saturated fat."  When preparing a meal:  Fill half of your plate with vegetables and green salads.  Fill one fourth of your plate with whole grains. Look for the word "whole" as the first word in the ingredient list.  Fill one fourth of your plate with lean protein foods.  Eat more foods that have fiber, like apples, carrots, beans, peas, and barley.  Eat more home-cooked foods. Eat less at restaurants and buffets.  Limit or avoid alcohol.  Limit foods high in starch and sugar.  Limit fried foods.  Cook foods without frying them. Baking, boiling, grilling, and broiling are all great options.  Lose weight if you are overweight. Losing even a small amount of weight can  help your overall health. It can also help prevent diseases such as diabetes and heart disease. What foods can I eat? Grains  Whole grains, such as whole wheat or whole grain breads, crackers, cereals, and pasta. Unsweetened oatmeal, bulgur, barley, quinoa, or brown rice. Corn or whole wheat flour tortillas. Vegetables  Fresh or frozen vegetables (raw, steamed, roasted, or grilled). Green salads. Fruits  All fresh, canned (in natural juice), or frozen fruits. Meat and Other Protein Products  Ground beef (85% or leaner), grass-fed beef, or beef trimmed of fat. Skinless chicken or [place]. Ground chicken or [place].  Pork trimmed of fat. All fish and seafood. Eggs. Dried beans, peas, or lentils. Unsalted nuts or seeds. Unsalted canned or dry beans. Dairy  Low-fat dairy products, such as skim or 1% milk, 2% or reduced-fat cheeses, low-fat ricotta or cottage cheese, or plain low-fat yogurt. Fats and Oils  Tub margarines without trans fats. Light or reduced-fat mayonnaise and salad dressings. Avocado. Olive, canola, sesame, or safflower oils. Natural peanut or almond butter (choose ones without added sugar and oil). The items listed above may not be a complete list of recommended foods or beverages. Contact your dietitian for more options.  What foods are not recommended? Grains  White bread. White pasta. White rice. Cornbread. Bagels, pastries, and croissants. Crackers that contain trans fat. Vegetables  White potatoes. Corn. Creamed or fried vegetables. Vegetables in a cheese sauce. Fruits  Dried fruits. Canned fruit in light or heavy syrup. Fruit juice. Meat and Other Protein Products  Fatty cuts of meat. Ribs, chicken wings, bacon, sausage, bologna, salami, chitterlings, fatback, hot dogs, bratwurst, and packaged luncheon meats. Liver and organ meats. Dairy  Whole or 2% milk, cream, half-and-half, and cream cheese. Whole milk cheeses. Whole-fat or sweetened yogurt. Full-fat cheeses. Nondairy creamers and whipped toppings. Processed cheese, cheese spreads, or cheese curds. Sweets and Desserts  Corn syrup, sugars, honey, and molasses. Candy. Jam and jelly. Syrup. Sweetened cereals. Cookies, pies, cakes, donuts, muffins, and ice cream. Fats and Oils  Butter, stick margarine, lard, shortening, ghee, or bacon fat. Coconut, palm kernel, or palm oils. Beverages  Alcohol. Sweetened drinks (such as sodas, lemonade, and fruit drinks or punches). The items listed above may not be a complete list of foods and beverages to avoid. Contact your dietitian for more information.  This information is not intended to  replace advice given to you by your health care provider. Make sure you discuss any questions you have with your health care provider. Document Released: 12/30/2011 Document Revised: 03/06/2016 Document Reviewed: 09/29/2013 Elsevier Interactive Patient Education  2017 [place] DASH stands for "Dietary Approaches to Stop Hypertension." The DASH eating plan is a healthy eating plan that has been shown to reduce high blood pressure (hypertension). It may also reduce your risk for type 2 diabetes, heart disease, and stroke. The DASH eating plan may also help with weight loss. What are tips for following this plan? General guidelines   Avoid eating more than 2,300 mg (milligrams) of salt (sodium) a day. If you have hypertension, you may need to reduce your sodium intake to 1,500 mg a day.  Limit alcohol intake to no more than 1 drink a day for nonpregnant women and 2 drinks a day for men. One drink equals 12 oz of beer, 5 oz of Roselani Grajeda, or 1 oz of hard liquor.  Work with your health care provider to maintain a healthy body weight or to lose weight. Ask what an ideal  weight is for you.  Get at least 30 minutes of exercise that causes your heart to beat faster (aerobic exercise) most days of the week. Activities may include walking, swimming, or biking.  Work with your health care provider or diet and nutrition specialist (dietitian) to adjust your eating plan to your individual calorie needs. Reading food labels   Check food labels for the amount of sodium per serving. Choose foods with less than 5 percent of the Daily Value of sodium. Generally, foods with less than 300 mg of sodium per serving fit into this eating plan.  To find whole grains, look for the word "whole" as the first word in the ingredient list. Shopping   Buy products labeled as "low-sodium" or "no salt added."  Buy fresh foods. Avoid canned foods and premade or frozen meals. Cooking   Avoid adding salt  when cooking. Use salt-free seasonings or herbs instead of table salt or sea salt. Check with your health care provider or pharmacist before using salt substitutes.  Do not fry foods. Cook foods using healthy methods such as baking, boiling, grilling, and broiling instead.  Cook with heart-healthy oils, such as olive, canola, soybean, or sunflower oil. Meal planning    Eat a balanced diet that includes:  5 or more servings of fruits and vegetables each day. At each meal, try to fill half of your plate with fruits and vegetables.  Up to 6-8 servings of whole grains each day.  Less than 6 oz of lean meat, poultry, or fish each day. A 3-oz serving of meat is about the same size as a deck of cards. One egg equals 1 oz.  2 servings of low-fat dairy each day.  A serving of nuts, seeds, or beans 5 times each week.  Heart-healthy fats. Healthy fats called Omega-3 fatty acids are found in foods such as flaxseeds and coldwater fish, like sardines, salmon, and mackerel.  Limit how much you eat of the following:  Canned or prepackaged foods.  Food that is high in trans fat, such as fried foods.  Food that is high in saturated fat, such as fatty meat.  Sweets, desserts, sugary drinks, and other foods with added sugar.  Full-fat dairy products.  Do not salt foods before eating.  Try to eat at least 2 vegetarian meals each week.  Eat more home-cooked food and less restaurant, buffet, and fast food.  When eating at a restaurant, ask that your food be prepared with less salt or no salt, if possible. What foods are recommended? The items listed may not be a complete list. Talk with your dietitian about what dietary choices are best for you. Grains  Whole-grain or whole-wheat bread. Whole-grain or whole-wheat pasta. Brown rice. [place]. Bulgur. Whole-grain and low-sodium cereals. Pita bread. Low-fat, low-sodium crackers. Whole-wheat flour tortillas. Vegetables  Fresh or frozen  vegetables (raw, steamed, roasted, or grilled). Low-sodium or reduced-sodium tomato and vegetable juice. Low-sodium or reduced-sodium tomato sauce and tomato paste. Low-sodium or reduced-sodium canned vegetables. Fruits  All fresh, dried, or frozen fruit. Canned fruit in natural juice (without added sugar). Meat and other protein foods  Skinless chicken or [place]. Ground chicken or [place]. Pork with fat trimmed off. Fish and seafood. Egg whites. Dried beans, peas, or lentils. Unsalted nuts, nut butters, and seeds. Unsalted canned beans. Lean cuts of beef with fat trimmed off. Low-sodium, lean deli meat. Dairy  Low-fat (1%) or fat-free (skim) milk. Fat-free, low-fat, or reduced-fat cheeses. Nonfat, low-sodium ricotta or cottage  cheese. Low-fat or nonfat yogurt. Low-fat, low-sodium cheese. Fats and oils  Soft margarine without trans fats. Vegetable oil. Low-fat, reduced-fat, or light mayonnaise and salad dressings (reduced-sodium). Canola, safflower, olive, soybean, and sunflower oils. Avocado. Seasoning and other foods  Herbs. Spices. Seasoning mixes without salt. Unsalted popcorn and pretzels. Fat-free sweets. What foods are not recommended? The items listed may not be a complete list. Talk with your dietitian about what dietary choices are best for you. Grains  Baked goods made with fat, such as croissants, muffins, or some breads. Dry pasta or rice meal packs. Vegetables  Creamed or fried vegetables. Vegetables in a cheese sauce. Regular canned vegetables (not low-sodium or reduced-sodium). Regular canned tomato sauce and paste (not low-sodium or reduced-sodium). Regular tomato and vegetable juice (not low-sodium or reduced-sodium). Angie Fava. Olives. Fruits  Canned fruit in a light or heavy syrup. Fried fruit. Fruit in cream or butter sauce. Meat and other protein foods  Fatty cuts of meat. Ribs. Fried meat. Berniece Salines. Sausage. Bologna and other processed lunch meats. Salami. Fatback. Hotdogs.  Bratwurst. Salted nuts and seeds. Canned beans with added salt. Canned or smoked fish. Whole eggs or egg yolks. Chicken or [place] with skin. Dairy  Whole or 2% milk, cream, and half-and-half. Whole or full-fat cream cheese. Whole-fat or sweetened yogurt. Full-fat cheese. Nondairy creamers. Whipped toppings. Processed cheese and cheese spreads. Fats and oils  Butter. Stick margarine. Lard. Shortening. Ghee. Bacon fat. Tropical oils, such as coconut, palm kernel, or palm oil. Seasoning and other foods  Salted popcorn and pretzels. Onion salt, garlic salt, seasoned salt, table salt, and sea salt. Worcestershire sauce. Tartar sauce. Barbecue sauce. Teriyaki sauce. Soy sauce, including reduced-sodium. Steak sauce. Canned and packaged gravies. Fish sauce. Oyster sauce. Cocktail sauce. Horseradish that you find on the shelf. Ketchup. Mustard. Meat flavorings and tenderizers. Bouillon cubes. Hot sauce and Tabasco sauce. Premade or packaged marinades. Premade or packaged taco seasonings. Relishes. Regular salad dressings. Where to find more information:  National Heart, Lung, and [place]: https://wilson-eaton.com/  American Heart Association: www.heart.org Summary  The DASH eating plan is a healthy eating plan that has been shown to reduce high blood pressure (hypertension). It may also reduce your risk for type 2 diabetes, heart disease, and stroke.  With the DASH eating plan, you should limit salt (sodium) intake to 2,300 mg a day. If you have hypertension, you may need to reduce your sodium intake to 1,500 mg a day.  When on the DASH eating plan, aim to eat more fresh fruits and vegetables, whole grains, lean proteins, low-fat dairy, and heart-healthy fats.  Work with your health care provider or diet and nutrition specialist (dietitian) to adjust your eating plan to your individual calorie needs. This information is not intended to replace advice given to you by your health care provider. Make sure  you discuss any questions you have with your health care provider. Document Released: 06/19/2011 Document Revised: 06/23/2016 Document Reviewed: 06/23/2016 Elsevier Interactive Patient Education  2017 [employer].

## 2016-09-26 NOTE — Assessment & Plan Note (Signed)
  On diet  

## 2016-09-26 NOTE — Assessment & Plan Note (Signed)
PRN Xanax  Potential benefits of a long term benzodiazepines  use as well as potential risks  and complications were explained to the patient and were aknowledged.

## 2016-09-26 NOTE — Assessment & Plan Note (Signed)
Norco Rx  Potential benefits of a long term opioids use as well as potential risks (i.e. addiction risk, apnea etc) and complications (i.e. Somnolence, constipation and others) were explained to the patient and were aknowledged.

## 2016-09-26 NOTE — Progress Notes (Signed)
Subjective:  Patient ID: Amanda Arnold, female    DOB: 06-18-1946  Age: 71 y.o. MRN: [id number]  CC: Medicare Wellness   HPI Amanda Arnold presents for LBP, anxiety f/u C/o cyst on L prox hip   Outpatient Medications Prior to Visit  Medication Sig Dispense Refill  . ALPRAZolam (XANAX) 0.5 MG tablet Take 1 tablet (0.5 mg total) by mouth 2 (two) times daily as needed for anxiety. 60 tablet 2  . Cholecalciferol (VITAMIN D3) 2000 UNITS capsule Take 1 capsule (2,000 Units total) by mouth daily. 100 capsule 3  . HYDROcodone-acetaminophen (NORCO) 7.5-325 MG tablet Take 1 tablet by mouth every 8 (eight) hours as needed for severe pain. 90 tablet 0  . ketoconazole (NIZORAL) 2 % cream Apply 1 application topically 2 (two) times daily. In the groin 60 g 1  . meclizine (ANTIVERT) 12.5 MG tablet Take 1-2 tablets (12.5-25 mg total) by mouth 3 (three) times daily as needed for dizziness. 60 tablet 1  . metoprolol succinate (TOPROL-XL) 25 MG 24 hr tablet Take 0.5 tablets (12.5 mg total) by mouth at bedtime. 90 tablet 3  . umeclidinium-vilanterol (ANORO ELLIPTA) 62.5-25 MCG/INH AEPB Inhale 1 puff into the lungs daily. 1 each 11  . ibuprofen (ADVIL,MOTRIN) 800 MG tablet Take 1 tablet (800 mg total) by mouth every 8 (eight) hours as needed. 21 tablet 0  . pramipexole (MIRAPEX) 0.125 MG tablet Take 1 tablet (0.125 mg total) by mouth at bedtime. 30 tablet 5  . tolterodine (DETROL LA) 4 MG 24 hr capsule Take 1 capsule (4 mg total) by mouth daily. 30 capsule 11   No facility-administered medications prior to visit.     ROS Review of Systems  Constitutional: Negative for activity change, appetite change, chills, fatigue and unexpected weight change.  HENT: Negative for congestion, mouth sores and sinus pressure.   Eyes: Negative for visual disturbance.  Respiratory: Negative for cough and chest tightness.   Gastrointestinal: Negative for abdominal pain and nausea.  Genitourinary: Negative for  difficulty urinating, frequency and vaginal pain.  Musculoskeletal: Positive for back pain. Negative for gait problem.  Skin: Negative for pallor and rash.  Neurological: Negative for dizziness, tremors, weakness, numbness and headaches.  Psychiatric/Behavioral: Negative for confusion and sleep disturbance. The patient is nervous/anxious.     Objective:  BP 130/62   Pulse 69   Temp 98.4 F (36.9 C) (Oral)   Resp 16   Ht 5\' 2"  (1.575 m)   Wt 203 lb 8 oz (92.3 kg)   SpO2 98%   BMI 37.22 kg/m   BP Readings from Last 3 Encounters:  09/26/16 130/62  07/18/16 128/68  06/27/16 130/60    Wt Readings from Last 3 Encounters:  09/26/16 203 lb 8 oz (92.3 kg)  07/18/16 203 lb (92.1 kg)  06/27/16 200 lb (90.7 kg)    Physical Exam  Constitutional: She appears well-developed. No distress.  HENT:  Head: Normocephalic.  Right Ear: External ear normal.  Left Ear: External ear normal.  Nose: Nose normal.  Mouth/Throat: Oropharynx is clear and moist.  Eyes: Conjunctivae are normal. Pupils are equal, round, and reactive to light. Right eye exhibits no discharge. Left eye exhibits no discharge.  Neck: Normal range of motion. Neck supple. No JVD present. No tracheal deviation present. No thyromegaly present.  Cardiovascular: Normal rate, regular rhythm and normal heart sounds.   Pulmonary/Chest: No stridor. No respiratory distress. She has no wheezes.  Abdominal: Soft. Bowel sounds are normal. She exhibits no distension  and no mass. There is no tenderness. There is no rebound and no guarding.  Musculoskeletal: She exhibits tenderness. She exhibits no edema.  Lymphadenopathy:    She has no cervical adenopathy.  Neurological: She displays normal reflexes. No cranial nerve deficit. She exhibits normal muscle tone. Coordination normal.  Skin: No rash noted. No erythema.  Psychiatric: She has a normal mood and affect. Her behavior is normal. Judgment and thought content normal.  LS tender cyst  on L prox hip below the buttock  Lab Results  Component Value Date   WBC 7.8 06/30/2016   HGB 13.9 06/30/2016   HCT 40.9 06/30/2016   PLT 221.0 06/30/2016   GLUCOSE 128 (H) 06/30/2016   CHOL 148 06/30/2016   TRIG 185.0 (H) 06/30/2016   HDL 47.30 06/30/2016   LDLCALC 63 06/30/2016   ALT 16 06/30/2016   AST 18 06/30/2016   NA 140 06/30/2016   K 4.6 06/30/2016   CL 105 06/30/2016   CREATININE 0.70 06/30/2016   BUN 19 06/30/2016   CO2 27 06/30/2016   TSH 1.90 06/30/2016   HGBA1C 6.1 06/30/2016    Mm Screening Breast Tomo Bilateral  Result Date: 08/22/2016 CLINICAL DATA:  Screening. EXAM: 2D DIGITAL SCREENING BILATERAL MAMMOGRAM WITH CAD AND ADJUNCT TOMO COMPARISON:  Previous exam(s). ACR Breast Density Category a: The breast tissue is almost entirely fatty. FINDINGS: There are no findings suspicious for malignancy. Images were processed with CAD. IMPRESSION: No mammographic evidence of malignancy. A result letter of this screening mammogram will be mailed directly to the patient. RECOMMENDATION: Screening mammogram in one year. (Code:SM-B-01Y) BI-RADS CATEGORY  1: Negative. Electronically Signed   By: Everlean Alstrom M.D.   On: 08/22/2016 14:36    Assessment & Plan:   Amanda Arnold was seen today for medicare wellness.  Diagnoses and all orders for this visit:  Estrogen deficiency -     DEXAScan; Future  Encounter for Medicare annual wellness exam   I have discontinued Amanda Arnold's ibuprofen, pramipexole, and tolterodine. I am also having her maintain her Vitamin D3, umeclidinium-vilanterol, meclizine, ketoconazole, metoprolol succinate, ALPRAZolam, and HYDROcodone-acetaminophen.  No orders of the defined types were placed in this encounter.    Follow-up: No Follow-up on file.  Walker Kehr, MD

## 2016-09-26 NOTE — Progress Notes (Signed)
Pre-visit discussion using our clinic review tool. No additional management support is needed unless otherwise documented below in the visit note.  

## 2016-09-26 NOTE — Assessment & Plan Note (Signed)
Dr Allyson Sabal ref the pt to surgery

## 2016-09-30 ENCOUNTER — Other Ambulatory Visit: Payer: Self-pay | Admitting: Surgery

## 2016-10-01 ENCOUNTER — Encounter (HOSPITAL_BASED_OUTPATIENT_CLINIC_OR_DEPARTMENT_OTHER): Payer: Self-pay | Admitting: *Deleted

## 2016-10-01 NOTE — Progress Notes (Signed)
   10/01/16 1008  OBSTRUCTIVE SLEEP APNEA  Have you ever been diagnosed with sleep apnea through a sleep study? Yes  If yes, do you have and use a CPAP or BPAP machine every night? 0  Do you snore loudly (loud enough to be heard through closed doors)?  1  Do you often feel tired, fatigued, or sleepy during the daytime (such as falling asleep during driving or talking to someone)? 0  Has anyone observed you stop breathing during your sleep? 0  Do you have, or are you being treated for high blood pressure? 1  BMI more than 35 kg/m2? 1  Age > 58 (1-yes) 1  Female Gender (Yes=1) 0  Obstructive Sleep Apnea Score 4  Score 5 or greater  (dr plotnikov aware pt does not use cpap)

## 2016-10-02 ENCOUNTER — Encounter (HOSPITAL_BASED_OUTPATIENT_CLINIC_OR_DEPARTMENT_OTHER)
Admission: RE | Admit: 2016-10-02 | Discharge: 2016-10-02 | Disposition: A | Discharge status: Home / Self Care | Payer: Medicare Other | Source: Ambulatory Visit | Attending: Surgery | Admitting: Surgery

## 2016-10-02 DIAGNOSIS — G2581 Restless legs syndrome: Secondary | ICD-10-CM | POA: Diagnosis not present

## 2016-10-02 DIAGNOSIS — I341 Nonrheumatic mitral (valve) prolapse: Secondary | ICD-10-CM | POA: Diagnosis not present

## 2016-10-02 DIAGNOSIS — Z79899 Other long term (current) drug therapy: Secondary | ICD-10-CM | POA: Diagnosis not present

## 2016-10-02 DIAGNOSIS — I10 Essential (primary) hypertension: Secondary | ICD-10-CM | POA: Diagnosis not present

## 2016-10-02 DIAGNOSIS — F17210 Nicotine dependence, cigarettes, uncomplicated: Secondary | ICD-10-CM | POA: Diagnosis not present

## 2016-10-02 DIAGNOSIS — M549 Dorsalgia, unspecified: Secondary | ICD-10-CM | POA: Diagnosis not present

## 2016-10-02 DIAGNOSIS — L720 Epidermal cyst: Secondary | ICD-10-CM | POA: Diagnosis not present

## 2016-10-02 DIAGNOSIS — G4730 Sleep apnea, unspecified: Secondary | ICD-10-CM | POA: Diagnosis not present

## 2016-10-02 DIAGNOSIS — J449 Chronic obstructive pulmonary disease, unspecified: Secondary | ICD-10-CM | POA: Diagnosis not present

## 2016-10-02 DIAGNOSIS — F329 Major depressive disorder, single episode, unspecified: Secondary | ICD-10-CM | POA: Diagnosis not present

## 2016-10-02 DIAGNOSIS — E669 Obesity, unspecified: Secondary | ICD-10-CM | POA: Diagnosis not present

## 2016-10-02 DIAGNOSIS — F419 Anxiety disorder, unspecified: Secondary | ICD-10-CM | POA: Diagnosis not present

## 2016-10-02 DIAGNOSIS — E119 Type 2 diabetes mellitus without complications: Secondary | ICD-10-CM | POA: Diagnosis not present

## 2016-10-02 DIAGNOSIS — Z6836 Body mass index (BMI) 36.0-36.9, adult: Secondary | ICD-10-CM | POA: Diagnosis not present

## 2016-10-02 DIAGNOSIS — R222 Localized swelling, mass and lump, trunk: Secondary | ICD-10-CM | POA: Diagnosis present

## 2016-10-02 DIAGNOSIS — Z79891 Long term (current) use of opiate analgesic: Secondary | ICD-10-CM | POA: Diagnosis not present

## 2016-10-06 ENCOUNTER — Ambulatory Visit (HOSPITAL_BASED_OUTPATIENT_CLINIC_OR_DEPARTMENT_OTHER): Payer: Medicare Other | Admitting: Anesthesiology

## 2016-10-06 ENCOUNTER — Encounter (HOSPITAL_BASED_OUTPATIENT_CLINIC_OR_DEPARTMENT_OTHER)
Admission: RE | Disposition: A | Discharge status: Home / Self Care | Payer: Self-pay | Source: Ambulatory Visit | Attending: Surgery

## 2016-10-06 ENCOUNTER — Encounter (HOSPITAL_BASED_OUTPATIENT_CLINIC_OR_DEPARTMENT_OTHER): Payer: Self-pay | Admitting: *Deleted

## 2016-10-06 ENCOUNTER — Ambulatory Visit (HOSPITAL_BASED_OUTPATIENT_CLINIC_OR_DEPARTMENT_OTHER)
Admission: RE | Admit: 2016-10-06 | Discharge: 2016-10-06 | Disposition: A | Discharge status: Home / Self Care | Payer: Medicare Other | Source: Ambulatory Visit | Attending: Surgery | Admitting: Surgery

## 2016-10-06 DIAGNOSIS — L723 Sebaceous cyst: Secondary | ICD-10-CM | POA: Diagnosis not present

## 2016-10-06 DIAGNOSIS — E119 Type 2 diabetes mellitus without complications: Secondary | ICD-10-CM | POA: Diagnosis not present

## 2016-10-06 DIAGNOSIS — G2581 Restless legs syndrome: Secondary | ICD-10-CM | POA: Diagnosis not present

## 2016-10-06 DIAGNOSIS — E669 Obesity, unspecified: Secondary | ICD-10-CM | POA: Insufficient documentation

## 2016-10-06 DIAGNOSIS — I10 Essential (primary) hypertension: Secondary | ICD-10-CM | POA: Insufficient documentation

## 2016-10-06 DIAGNOSIS — Z79899 Other long term (current) drug therapy: Secondary | ICD-10-CM | POA: Diagnosis not present

## 2016-10-06 DIAGNOSIS — M549 Dorsalgia, unspecified: Secondary | ICD-10-CM | POA: Insufficient documentation

## 2016-10-06 DIAGNOSIS — F17210 Nicotine dependence, cigarettes, uncomplicated: Secondary | ICD-10-CM | POA: Insufficient documentation

## 2016-10-06 DIAGNOSIS — F329 Major depressive disorder, single episode, unspecified: Secondary | ICD-10-CM | POA: Insufficient documentation

## 2016-10-06 DIAGNOSIS — I341 Nonrheumatic mitral (valve) prolapse: Secondary | ICD-10-CM | POA: Insufficient documentation

## 2016-10-06 DIAGNOSIS — F419 Anxiety disorder, unspecified: Secondary | ICD-10-CM | POA: Insufficient documentation

## 2016-10-06 DIAGNOSIS — G4730 Sleep apnea, unspecified: Secondary | ICD-10-CM | POA: Insufficient documentation

## 2016-10-06 DIAGNOSIS — M1990 Unspecified osteoarthritis, unspecified site: Secondary | ICD-10-CM | POA: Diagnosis not present

## 2016-10-06 DIAGNOSIS — L720 Epidermal cyst: Secondary | ICD-10-CM | POA: Diagnosis not present

## 2016-10-06 DIAGNOSIS — R222 Localized swelling, mass and lump, trunk: Secondary | ICD-10-CM | POA: Diagnosis not present

## 2016-10-06 DIAGNOSIS — Z79891 Long term (current) use of opiate analgesic: Secondary | ICD-10-CM | POA: Insufficient documentation

## 2016-10-06 DIAGNOSIS — G4733 Obstructive sleep apnea (adult) (pediatric): Secondary | ICD-10-CM | POA: Diagnosis not present

## 2016-10-06 DIAGNOSIS — E785 Hyperlipidemia, unspecified: Secondary | ICD-10-CM | POA: Diagnosis not present

## 2016-10-06 DIAGNOSIS — J449 Chronic obstructive pulmonary disease, unspecified: Secondary | ICD-10-CM | POA: Insufficient documentation

## 2016-10-06 DIAGNOSIS — Z6836 Body mass index (BMI) 36.0-36.9, adult: Secondary | ICD-10-CM | POA: Insufficient documentation

## 2016-10-06 HISTORY — PX: MASS EXCISION: SHX2000

## 2016-10-06 HISTORY — DX: Prediabetes: R73.03

## 2016-10-06 SURGERY — EXCISION MASS
Anesthesia: Monitor Anesthesia Care | Site: Buttocks | Laterality: Left

## 2016-10-06 MED ORDER — LIDOCAINE-EPINEPHRINE 0.5 %-1:200000 IJ SOLN
INTRAMUSCULAR | Status: DC | PRN
  Administered 2016-10-06: 30 mL

## 2016-10-06 MED ORDER — CHLORHEXIDINE GLUCONATE CLOTH 2 % EX PADS: 6.0000 | MEDICATED_PAD | Freq: Once | CUTANEOUS | Status: DC

## 2016-10-06 MED ORDER — LACTATED RINGERS IV SOLN
INTRAVENOUS | Status: DC
  Administered 2016-10-06: 10:00:00 via INTRAVENOUS

## 2016-10-06 MED ORDER — CEFAZOLIN SODIUM-DEXTROSE 2-4 GM/100ML-% IV SOLN
2.0000 g | INTRAVENOUS | Status: AC
  Administered 2016-10-06: 2 g via INTRAVENOUS

## 2016-10-06 MED ORDER — PROPOFOL 10 MG/ML IV BOLUS
INTRAVENOUS | Status: DC | PRN
  Administered 2016-10-06: 10 mg via INTRAVENOUS
  Administered 2016-10-06: 20 mg via INTRAVENOUS
  Administered 2016-10-06: 10 mg via INTRAVENOUS
  Administered 2016-10-06: 20 mg via INTRAVENOUS
  Administered 2016-10-06: 10 mg via INTRAVENOUS

## 2016-10-06 MED ORDER — ONDANSETRON HCL 4 MG/2ML IJ SOLN
INTRAMUSCULAR | Status: DC | PRN
  Administered 2016-10-06: 4 mg via INTRAVENOUS

## 2016-10-06 MED ORDER — CEFAZOLIN SODIUM-DEXTROSE 2-4 GM/100ML-% IV SOLN
INTRAVENOUS | Status: AC
  Filled 2016-10-06: qty 100

## 2016-10-06 MED ORDER — MIDAZOLAM HCL 2 MG/2ML IJ SOLN
INTRAMUSCULAR | Status: AC
  Filled 2016-10-06: qty 2

## 2016-10-06 MED ORDER — FENTANYL CITRATE (PF) 100 MCG/2ML IJ SOLN
INTRAMUSCULAR | Status: AC
  Filled 2016-10-06: qty 2

## 2016-10-06 MED ORDER — ONDANSETRON HCL 4 MG/2ML IJ SOLN: 4.0000 mg | Freq: Once | INTRAMUSCULAR | Status: DC | PRN

## 2016-10-06 MED ORDER — BUPIVACAINE HCL (PF) 0.25 % IJ SOLN
INTRAMUSCULAR | Status: DC | PRN
  Administered 2016-10-06: 30 mL

## 2016-10-06 MED ORDER — LIDOCAINE-EPINEPHRINE 0.5 %-1:200000 IJ SOLN
INTRAMUSCULAR | Status: AC
  Filled 2016-10-06: qty 1

## 2016-10-06 MED ORDER — SCOPOLAMINE 1 MG/3DAYS TD PT72: 1.0000 | MEDICATED_PATCH | Freq: Once | TRANSDERMAL | Status: DC | PRN

## 2016-10-06 MED ORDER — FENTANYL CITRATE (PF) 100 MCG/2ML IJ SOLN
50.0000 ug | INTRAMUSCULAR | Status: DC | PRN
  Administered 2016-10-06: 100 ug via INTRAVENOUS

## 2016-10-06 MED ORDER — PROPOFOL 500 MG/50ML IV EMUL: INTRAVENOUS | Status: DC | PRN

## 2016-10-06 MED ORDER — LIDOCAINE 2% (20 MG/ML) 5 ML SYRINGE
INTRAMUSCULAR | Status: AC
  Filled 2016-10-06: qty 5

## 2016-10-06 MED ORDER — FENTANYL CITRATE (PF) 100 MCG/2ML IJ SOLN: 25.0000 ug | INTRAMUSCULAR | Status: DC | PRN

## 2016-10-06 MED ORDER — MIDAZOLAM HCL 2 MG/2ML IJ SOLN
1.0000 mg | INTRAMUSCULAR | Status: DC | PRN
  Administered 2016-10-06: 2 mg via INTRAVENOUS

## 2016-10-06 SURGICAL SUPPLY — 49 items
ADH SKN CLS APL DERMABOND .7 (GAUZE/BANDAGES/DRESSINGS) ×1
APL SKNCLS STERI-STRIP NONHPOA (GAUZE/BANDAGES/DRESSINGS)
BENZOIN TINCTURE PRP APPL 2/3 (GAUZE/BANDAGES/DRESSINGS) IMPLANT
BLADE HEX COATED 2.75 (ELECTRODE) IMPLANT
BLADE SURG 10 STRL SS (BLADE) ×1 IMPLANT
BLADE SURG 15 STRL LF DISP TIS (BLADE) ×1 IMPLANT
BLADE SURG 15 STRL SS (BLADE) ×3
CHLORAPREP W/TINT 26ML (MISCELLANEOUS) ×3 IMPLANT
CLOSURE WOUND 1/2 X4 (GAUZE/BANDAGES/DRESSINGS)
COVER BACK TABLE 60X90IN (DRAPES) ×3 IMPLANT
COVER MAYO STAND STRL (DRAPES) ×1 IMPLANT
DECANTER SPIKE VIAL GLASS SM (MISCELLANEOUS) IMPLANT
DERMABOND ADVANCED (GAUZE/BANDAGES/DRESSINGS) ×2
DERMABOND ADVANCED .7 DNX12 (GAUZE/BANDAGES/DRESSINGS) IMPLANT
DRAPE LAPAROTOMY 100X72 PEDS (DRAPES) ×3 IMPLANT
DRAPE UTILITY XL STRL (DRAPES) ×3 IMPLANT
ELECT COATED BLADE 2.86 ST (ELECTRODE) ×2 IMPLANT
ELECT REM PT RETURN 9FT ADLT (ELECTROSURGICAL) ×3
ELECTRODE REM PT RTRN 9FT ADLT (ELECTROSURGICAL) ×1 IMPLANT
GAUZE SPONGE 4X4 12PLY STRL (GAUZE/BANDAGES/DRESSINGS) ×2 IMPLANT
GAUZE SPONGE 4X4 12PLY STRL LF (GAUZE/BANDAGES/DRESSINGS) ×1 IMPLANT
GLOVE BIO SURGEON STRL SZ7 (GLOVE) ×2 IMPLANT
GLOVE SURG SIGNA 7.5 PF LTX (GLOVE) ×3 IMPLANT
GOWN STRL REUS W/ TWL LRG LVL3 (GOWN DISPOSABLE) ×1 IMPLANT
GOWN STRL REUS W/ TWL XL LVL3 (GOWN DISPOSABLE) IMPLANT
GOWN STRL REUS W/TWL LRG LVL3 (GOWN DISPOSABLE) ×3
GOWN STRL REUS W/TWL XL LVL3 (GOWN DISPOSABLE) ×3
NDL HYPO 25X1 1.5 SAFETY (NEEDLE) ×1 IMPLANT
NEEDLE HYPO 25X1 1.5 SAFETY (NEEDLE) ×3 IMPLANT
NS IRRIG 1000ML POUR BTL (IV SOLUTION) ×3 IMPLANT
PACK BASIN DAY SURGERY FS (CUSTOM PROCEDURE TRAY) ×3 IMPLANT
PENCIL BUTTON HOLSTER BLD 10FT (ELECTRODE) ×3 IMPLANT
SPONGE LAP 18X18 X RAY DECT (DISPOSABLE) ×2 IMPLANT
STRIP CLOSURE SKIN 1/2X4 (GAUZE/BANDAGES/DRESSINGS) IMPLANT
SUT ETHILON 2 0 FS 18 (SUTURE) IMPLANT
SUT ETHILON 5 0 P 3 18 (SUTURE)
SUT MNCRL AB 4-0 PS2 18 (SUTURE) ×2 IMPLANT
SUT NYLON ETHILON 5-0 P-3 1X18 (SUTURE) IMPLANT
SUT VIC AB 5-0 P-3 18X BRD (SUTURE) IMPLANT
SUT VIC AB 5-0 P3 18 (SUTURE)
SUT VICRYL 3-0 CR8 SH (SUTURE) ×2 IMPLANT
SUT VICRYL 4-0 PS2 18IN ABS (SUTURE) IMPLANT
SYR BULB 3OZ (MISCELLANEOUS) ×3 IMPLANT
SYR CONTROL 10ML LL (SYRINGE) ×3 IMPLANT
TOWEL OR 17X24 6PK STRL BLUE (TOWEL DISPOSABLE) ×4 IMPLANT
TOWEL OR NON WOVEN STRL DISP B (DISPOSABLE) ×1 IMPLANT
TUBE CONNECTING 20'X1/4 (TUBING) ×1
TUBE CONNECTING 20X1/4 (TUBING) ×1 IMPLANT
YANKAUER SUCT BULB TIP NO VENT (SUCTIONS) ×2 IMPLANT

## 2016-10-06 NOTE — Transfer of Care (Signed)
Immediate Anesthesia Transfer of Care Note  Patient: Amanda Arnold  Procedure(s) Performed: Procedure(s): EXCISION LEFT BUTTOCKS MASS (Left)  Patient Location: PACU  Anesthesia Type:MAC  Level of Consciousness: awake, alert  and oriented  Airway & Oxygen Therapy: Patient Spontanous Breathing and Patient connected to face mask oxygen  Post-op Assessment: Report given to RN and Post -op Vital signs reviewed and stable  Post vital signs: Reviewed and stable  Last Vitals:  Vitals:   10/06/16 0927 10/06/16 1233  BP: (!) 142/62 (P) 114/66  Pulse: 76   Resp: 18 (!) (P) 21  Temp: 36.5 C (P) 36.4 C    Last Pain:  Vitals:   10/06/16 0927  TempSrc: Oral  PainSc: 3          Complications: No apparent anesthesia complications

## 2016-10-06 NOTE — Anesthesia Preprocedure Evaluation (Signed)
Anesthesia Evaluation  Patient identified by MRN, date of birth, ID band Patient awake    Reviewed: Allergy & Precautions, NPO status , Patient's Chart, lab work & pertinent test results, reviewed documented beta blocker date and time   Airway Mallampati: II  TM Distance: <3 FB Neck ROM: Full    Dental  (+) Teeth Intact, Dental Advisory Given, Poor Dentition, Missing, Chipped   Pulmonary sleep apnea , COPD,  COPD inhaler, Current Smoker,    Pulmonary exam normal breath sounds clear to auscultation       Cardiovascular hypertension, Pt. on home beta blockers (-) angina(-) CAD and (-) Past MI Normal cardiovascular exam Rhythm:Regular Rate:Normal     Neuro/Psych  Headaches, PSYCHIATRIC DISORDERS Anxiety Depression    GI/Hepatic negative GI ROS, Neg liver ROS,   Endo/Other  diabetes, Well Controlled, Type 2Obesity   Renal/GU negative Renal ROS     Musculoskeletal  (+) Arthritis ,   Abdominal   Peds  Hematology negative hematology ROS (+)   Anesthesia Other Findings Day of surgery medications reviewed with the patient.  Reproductive/Obstetrics                             Anesthesia Physical Anesthesia Plan  ASA: III  Anesthesia Plan: MAC   Post-op Pain Management:    Induction: Intravenous  Airway Management Planned: Simple Face Mask  Additional Equipment:   Intra-op Plan:   Post-operative Plan:   Informed Consent: I have reviewed the patients History and Physical, chart, labs and discussed the procedure including the risks, benefits and alternatives for the proposed anesthesia with the patient or authorized representative who has indicated his/her understanding and acceptance.   Dental advisory given  Plan Discussed with: CRNA  Anesthesia Plan Comments: (Discussed risks/benefits/alternatives to MAC sedation including need for ventilatory support, hypotension, need for conversion  to general anesthesia.  All patient questions answered.  Patient/guardian wishes to proceed.)        Anesthesia Quick Evaluation

## 2016-10-06 NOTE — Anesthesia Postprocedure Evaluation (Signed)
Anesthesia Post Note  Patient: Amanda Arnold  Procedure(s) Performed: Procedure(s) (LRB): EXCISION LEFT BUTTOCKS MASS (Left)  Patient location during evaluation: PACU Anesthesia Type: MAC Level of consciousness: awake and alert Pain management: pain level controlled Vital Signs Assessment: post-procedure vital signs reviewed and stable Respiratory status: spontaneous breathing, nonlabored ventilation, respiratory function stable and patient connected to nasal cannula oxygen Cardiovascular status: stable and blood pressure returned to baseline Anesthetic complications: no       Last Vitals:  Vitals:   10/06/16 1247 10/06/16 1248  BP:    Pulse: 70   Resp:  20  Temp:      Last Pain:  Vitals:   10/06/16 1248  TempSrc:   PainSc: 0-No pain                 Catalina Gravel

## 2016-10-06 NOTE — Interval H&P Note (Signed)
History and Physical Interval Note:  10/06/2016 11:32 AM  Amanda Arnold  has presented today for surgery, with the diagnosis of Left buttocks mass  The various methods of treatment have been discussed with the patient and family.  Daughter with patient.  After consideration of risks, benefits and other options for treatment, the patient has consented to  Procedure(s): EXCISION LEFT BUTTOCKS MASS (Left) as a surgical intervention .  The patient's history has been reviewed, patient examined, no change in status, stable for surgery.  I have reviewed the patient's chart and labs.  Questions were answered to the patient's satisfaction.     Amanda Arnold

## 2016-10-06 NOTE — H&P (Signed)
Amanda Arnold  Location: [facility] Surgery Patient #: [id number] DOB: [birth year] Married / Language: English / Race: White Female  History of Present Illness   Patient words: NP left buttock dermal nodule.  The patient is a 71 year old female who presents with a complaint of cyst of buttocks.   The PCP is Dr. Loni Muse Plotnikov  The patient was referred by F. Lupton. Comes with daughter, Amanda Arnold  The patient noticed a mass on her left buttocks for several years. It has slowly gotten larger. She has chronic back and orthopedic troubles of both legs. She is now having more pain in her left buttocks and down her left leg. She saw Dr. Nena Polio for this mass but did not want to have it excised under local anesthesia. So she is here to talk to me about excising this mass. She's had no previous excision of the skin mass. No history of skin cancer.  Plan: Will plan excision of left buttocks mass under MAC.  Past Medical History: 1. History of cholecystecotmy 2. Recent colonoscopy 3. BAck issues - but now controlled by Dr. Alain Marion 4. Chronic narcotic Vicodin daily 5. Restless legs 6. DM - but on no meds 7. Smokes about 1/4 pack of cigarettes 8. History of card cath in 2001 - which was negative except for a mitral valve prolapse She is not actively seeing a cardiologist.  Social History: Married. Comes with daughter, Amanda Arnold she has 5 children who all live nearby.    Diagnostic Studies History Yehuda Mao, RMA; 09/17/2016 9:00 AM) Colonoscopy  1-5 years ago Mammogram  within last year Pap Smear  >5 years ago  Allergies Shirlean Mylar Gwynn, RMA; 09/17/2016 9:03 AM) Morphine Sulfate *ANALGESICS - OPIOID*  BuPROPion HCl *CHEMICALS*  DULoxetine HCl *CHEMICALS*  Ezetimibe *ANTIHYPERLIPIDEMICS*  MetFORMIN HCl *CHEMICALS*  TraZODone HCl *ANTIDEPRESSANTS*  Gabapentin *CHEMICALS*  TraMADol HCl *CHEMICALS*  Allergies  Reconciled   Medication History (Robin Gwynn, RMA; 09/17/2016 9:05 AM) ALPRAZolam (0.5MG Tablet, Oral) Active. Hydrocodone-Acetaminophen (7.5-325MG Tablet, Oral) Active. Metoprolol Succinate ER (25MG Tablet ER 24HR, Oral) Active. Vitamin D (2000UNIT Capsule, Oral) Active. Nizoral (2% Cream, External) Active. Medications Reconciled  Social History Yehuda Mao, RMA; 09/17/2016 9:00 AM) Caffeine use  Carbonated beverages. No alcohol use  No drug use  Tobacco use  Current some day smoker.  Family History Yehuda Mao, RMA; 09/17/2016 9:00 AM) Alcohol Abuse  Father. Arthritis  Mother. Bleeding disorder  Mother. Breast Cancer  Family Members In General. Cervical Cancer  Mother. Depression  Brother, Father, Mother. Diabetes Mellitus  Brother. Hypertension  Father, Mother.  Pregnancy / Birth History Yehuda Mao, RMA; 09/17/2016 9:00 AM) Age at menarche  4 years. Age of menopause  <45 Gravida  61 Maternal age  71-20 Para  34  Other Problems Yehuda Mao, RMA; 09/17/2016 9:00 AM) Anxiety Disorder  Arthritis  Back Pain  High blood pressure     Review of Systems (Robin Gwynn RMA; 09/17/2016 9:00 AM) General Not Present- Appetite Loss, Chills, Fatigue, Fever, Night Sweats, Weight Gain and Weight Loss. Skin Present- Dryness and Rash. Not Present- Change in Wart/Mole, Hives, Jaundice, New Lesions, Non-Healing Wounds and Ulcer. HEENT Not Present- Earache, Hearing Loss, Hoarseness, Nose Bleed, Oral Ulcers, Ringing in the Ears, Seasonal Allergies, Sinus Pain, Sore Throat, Visual Disturbances, Wears glasses/contact lenses and Yellow Eyes. Respiratory Not Present- Bloody sputum, Chronic Cough, Difficulty Breathing, Snoring and Wheezing. Breast Not Present- Breast Mass, Breast Pain, Nipple Discharge and Skin Changes. Cardiovascular Not Present- Chest Pain, Difficulty  Breathing Lying Down, Leg Cramps, Palpitations, Rapid Heart Rate, Shortness of Breath and Swelling of  Extremities. Female Genitourinary Present- Nocturia. Not Present- Frequency, Painful Urination, Pelvic Pain and Urgency. Musculoskeletal Present- Back Pain, Joint Pain and Muscle Pain. Not Present- Joint Stiffness, Muscle Weakness and Swelling of Extremities. Neurological Present- Trouble walking. Not Present- Decreased Memory, Fainting, Headaches, Numbness, Seizures, Tingling, Tremor and Weakness. Psychiatric Present- Anxiety and Change in Sleep Pattern. Not Present- Bipolar, Depression, Fearful and Frequent crying. Endocrine Present- Hot flashes. Not Present- Cold Intolerance, Excessive Hunger, Hair Changes, Heat Intolerance and New Diabetes. Hematology Not Present- Blood Thinners, Easy Bruising, Excessive bleeding, Gland problems, HIV and Persistent Infections.  Vitals (Robin Gwynn RMA; 09/17/2016 9:06 AM) 09/17/2016 9:05 AM Weight: 202 lb Height: 62in Body Surface Area: 1.92 m Body Mass Index: 36.95 kg/m  Temp.: 97.43F  Pulse: 72 (Regular)  BP: 142/86 (Sitting, Left Arm, Standard)   Physical Exam  General: Obese WF alert and generally healthy appearing. HEENT: Normal. Pupils equal.  Neck: Supple. No mass. No thyroid mass.  Lymph Nodes: No supraclavicular or cervical nodes.  Lungs: Clear to auscultation and symmetric breath sounds. Heart: RRR. No murmur or rub.  Abdomen: Soft. No mass. No tenderness. No hernia. Normal bowel sounds.  Buttocks: 5 cm mass on left buttocks - sebaceous cyst vs lipoma  Extremities: Good strength and ROM in upper and lower extremities.  Neurologic: Grossly intact to motor and sensory function. Psychiatric: Has normal mood and affect. Behavior is normal.   Assessment & Plan  1.  BUTTOCKS NODULE (R22.2)  Impression: 5 cm mass of left buttocks  Plan:  1) Excision of left buttock mass  2. History of cholecystecotmy 3. Back issues - but now controlled by Dr. Alain Marion 4. Chronic narcotic  Vicodin daily 5. Restless  legs 6. DM - but on no meds 7. Smokes about 1/4 pack of cigarettes 8. History of card cath in 2001 - which was negative except for a mitral valve prolapse   Alphonsa Overall, MD, [facility] Pager: [phone] Office phone:  [phone]

## 2016-10-06 NOTE — Discharge Instructions (Signed)
[facility] - DISCHARGE INSTRUCTIONS TO PATIENT  Activity:  Driving - May drive tomorrow   Lifting - No limits  Wound Care:   Leave bandage for 2 days, then remove and shower  Diet:  As tolerated.  Follow up appointment:  Call Dr. Pollie Friar office [facility]) at [phone] for an appointment in 2 weeks.  Medications and dosages:  Resume your home medications.  Call Dr. Lucia Gaskins or his office  [phone]) if you have:  Temperature greater than 100.4,  Severe uncontrolled pain,  Redness, tenderness, or signs of infection (pain, swelling, redness, odor or green/yellow discharge around the site),  Any other questions or concerns you may have after discharge.  In an emergency, call 911 or go to an Emergency Department at a nearby hospital.      Post Anesthesia Home Care Instructions  Activity: Get plenty of rest for the remainder of the day. A responsible individual must stay with you for 24 hours following the procedure.  For the next 24 hours, DO NOT: -Drive a car -Paediatric nurse -Drink alcoholic beverages -Take any medication unless instructed by your physician -Make any legal decisions or sign important papers.  Meals: Start with liquid foods such as gelatin or soup. Progress to regular foods as tolerated. Avoid greasy, spicy, heavy foods. If nausea and/or vomiting occur, drink only clear liquids until the nausea and/or vomiting subsides. Call your physician if vomiting continues.  Special Instructions/Symptoms: Your throat may feel dry or sore from the anesthesia or the breathing tube placed in your throat during surgery. If this causes discomfort, gargle with warm salt water. The discomfort should disappear within 24 hours.  If you had a scopolamine patch placed behind your ear for the management of post- operative nausea and/or vomiting:  1. The medication in the patch is effective for 72 hours, after which it should be removed.   Wrap patch in a tissue and discard in the trash. Wash hands thoroughly with soap and water. 2. You may remove the patch earlier than 72 hours if you experience unpleasant side effects which may include dry mouth, dizziness or visual disturbances. 3. Avoid touching the patch. Wash your hands with soap and water after contact with the patch.

## 2016-10-06 NOTE — Op Note (Signed)
10/06/2016  12:25 PM  PATIENT:  Amanda Arnold, 71 y.o., female, MRN: [id number]  PREOP DIAGNOSIS:  Left buttocks mass  POSTOP DIAGNOSIS:   Left buttocks mass, sebaceous cyst, 5 cm  PROCEDURE:   Procedure(s): EXCISION LEFT BUTTOCKS MASS  SURGEON:   Alphonsa Overall, M.D.  ASSISTANT:   None  ANESTHESIA:   MAC  Anesthesiologist: Catalina Gravel, MD CRNA: Maryella Shivers, CRNA  Monitor Anesthesia Care  EBL:  minimal  ml  LOCAL MEDICATIONS USED:   30 cc of 1/2% xylocaine with 1/4% marcaine  SPECIMEN:   Cyst left buttocks  COUNTS CORRECT:  YES  INDICATIONS FOR PROCEDURE:  [name] is a 71 y.o. (DOB: [birth year]) white female whose primary care physician is Walker Kehr, MD and comes for excision of a left buttocks mass.   The indications and risks of the surgery were explained to the patient.  The risks include, but are not limited to, infection, bleeding, and nerve injury.  PROCEDURE:  The patient was taken to room #2 at [facility] day surgery. She was placed in a right lateral decubitus position with her left buttocks up. A left buttocks was prepped with ChloraPrep and sterilely draped.   A timeout was held and surgical checklist run.   She had a 5 cm mass of her left buttocks. I infiltrated the skin with a mixture of local anesthetic. I mixed one half percent Xylocaine with epinephrine with quarter percent Marcaine. I used a total of 30 mL.   I made an incision in the skin, excising some skin with the cyst.  I excised the cyst in its entirety. I then controlled bleeding with the electrocautery machine. I closed the wound in layers. I used 3-0 Vicryl suture for the deep and superficial subcutaneous tissue. I used 4-0 Monocryl in the skin. I painted the skin with Dermabond.   The wound was then sterilely dressed with 4 x 4's. The patient was transported to the recovery room in good condition. Sponge and needle count were correct at the end of the case.  Alphonsa Overall, MD,  [facility] Pager: [phone] Office phone:  [phone]

## 2016-10-07 ENCOUNTER — Encounter (HOSPITAL_BASED_OUTPATIENT_CLINIC_OR_DEPARTMENT_OTHER): Payer: Self-pay | Admitting: Surgery

## 2016-11-14 ENCOUNTER — Ambulatory Visit (INDEPENDENT_AMBULATORY_CARE_PROVIDER_SITE_OTHER)
Admission: RE | Admit: 2016-11-14 | Discharge: 2016-11-14 | Disposition: A | Discharge status: Home / Self Care | Payer: Medicare Other | Source: Ambulatory Visit | Attending: Internal Medicine | Admitting: Internal Medicine

## 2016-11-14 ENCOUNTER — Ambulatory Visit (INDEPENDENT_AMBULATORY_CARE_PROVIDER_SITE_OTHER): Payer: Medicare Other | Admitting: Internal Medicine

## 2016-11-14 ENCOUNTER — Encounter: Payer: Self-pay | Admitting: Internal Medicine

## 2016-11-14 DIAGNOSIS — R1031 Right lower quadrant pain: Secondary | ICD-10-CM | POA: Diagnosis not present

## 2016-11-14 DIAGNOSIS — G8929 Other chronic pain: Secondary | ICD-10-CM | POA: Diagnosis not present

## 2016-11-14 DIAGNOSIS — Z6836 Body mass index (BMI) 36.0-36.9, adult: Secondary | ICD-10-CM

## 2016-11-14 DIAGNOSIS — E2839 Other primary ovarian failure: Secondary | ICD-10-CM | POA: Diagnosis not present

## 2016-11-14 DIAGNOSIS — F411 Generalized anxiety disorder: Secondary | ICD-10-CM

## 2016-11-14 DIAGNOSIS — L729 Follicular cyst of the skin and subcutaneous tissue, unspecified: Secondary | ICD-10-CM

## 2016-11-14 DIAGNOSIS — I10 Essential (primary) hypertension: Secondary | ICD-10-CM

## 2016-11-14 DIAGNOSIS — E119 Type 2 diabetes mellitus without complications: Secondary | ICD-10-CM | POA: Diagnosis not present

## 2016-11-14 DIAGNOSIS — M5440 Lumbago with sciatica, unspecified side: Secondary | ICD-10-CM | POA: Diagnosis not present

## 2016-11-14 MED ORDER — HYDROCODONE-ACETAMINOPHEN 7.5-325 MG PO TABS: 1.0000 | ORAL_TABLET | Freq: Three times a day (TID) | ORAL | 0 refills | Status: DC | PRN

## 2016-11-14 MED ORDER — METOPROLOL SUCCINATE ER 25 MG PO TB24: 12.5000 mg | ORAL_TABLET | Freq: Every day | ORAL | 3 refills | Status: DC

## 2016-11-14 NOTE — Progress Notes (Signed)
Pre visit review using our clinic review tool, if applicable. No additional management support is needed unless otherwise documented below in the visit note. 

## 2016-11-14 NOTE — Assessment & Plan Note (Signed)
Labs

## 2016-11-14 NOTE — Assessment & Plan Note (Signed)
Healed well. 

## 2016-11-14 NOTE — Assessment & Plan Note (Signed)
Norco prn  Potential benefits of a long term opioids use as well as potential risks (i.e. addiction risk, apnea etc) and complications (i.e. Somnolence, constipation and others) were explained to the patient and were aknowledged. 

## 2016-11-14 NOTE — Assessment & Plan Note (Signed)
Wt Readings from Last 3 Encounters:  11/14/16 201 lb 1.3 oz (91.2 kg)  10/06/16 201 lb 2 oz (91.2 kg)  09/26/16 203 lb 8 oz (92.3 kg)

## 2016-11-14 NOTE — Assessment & Plan Note (Signed)
Xanax prn  Potential benefits of a long term benzodiazepines  use as well as potential risks  and complications were explained to the patient and were aknowledged. 

## 2016-11-14 NOTE — Assessment & Plan Note (Signed)
Toprol XL BP Readings from Last 3 Encounters:  11/14/16 126/78  10/06/16 114/68  09/26/16 130/62

## 2016-11-14 NOTE — Progress Notes (Signed)
Subjective:  Patient ID: Amanda Arnold, female    DOB: [birth year]  Age: 71 y.o. MRN: [id number]  CC: No chief complaint on file.   HPI KHELANI KOPS presents for anxiety, LBP, HTN f/u  Outpatient Medications Prior to Visit  Medication Sig Dispense Refill  . ALPRAZolam (XANAX) 0.5 MG tablet Take 1 tablet (0.5 mg total) by mouth 2 (two) times daily as needed for anxiety. 60 tablet 2  . Cholecalciferol (VITAMIN D3) 2000 UNITS capsule Take 1 capsule (2,000 Units total) by mouth daily. 100 capsule 3  . HYDROcodone-acetaminophen (NORCO) 7.5-325 MG tablet Take 1 tablet by mouth every 8 (eight) hours as needed for severe pain. 90 tablet 0  . ketoconazole (NIZORAL) 2 % cream Apply 1 application topically 2 (two) times daily. In the groin 60 g 1  . meclizine (ANTIVERT) 12.5 MG tablet Take 1-2 tablets (12.5-25 mg total) by mouth 3 (three) times daily as needed for dizziness. 60 tablet 1  . metoprolol succinate (TOPROL-XL) 25 MG 24 hr tablet Take 0.5 tablets (12.5 mg total) by mouth at bedtime. 90 tablet 3  . umeclidinium-vilanterol (ANORO ELLIPTA) 62.5-25 MCG/INH AEPB Inhale 1 puff into the lungs daily. 1 each 11   No facility-administered medications prior to visit.     ROS Review of Systems  Constitutional: Negative for activity change, appetite change, chills, fatigue and unexpected weight change.  HENT: Negative for congestion, mouth sores and sinus pressure.   Eyes: Negative for visual disturbance.  Respiratory: Negative for cough and chest tightness.   Gastrointestinal: Negative for abdominal pain and nausea.  Genitourinary: Negative for difficulty urinating, frequency and vaginal pain.  Musculoskeletal: Positive for back pain. Negative for gait problem.  Skin: Negative for pallor and rash.  Neurological: Positive for headaches. Negative for dizziness, tremors, weakness and numbness.  Psychiatric/Behavioral: Negative for confusion and sleep disturbance. The patient is  nervous/anxious.     Objective:  BP 126/78 (BP Location: Left Arm, Patient Position: Sitting, Cuff Size: Large)   Pulse 74   Temp 98.2 F (36.8 C) (Oral)   Ht 5\' 2"  (1.575 m)   Wt 201 lb 1.3 oz (91.2 kg)   SpO2 99%   BMI 36.78 kg/m   BP Readings from Last 3 Encounters:  11/14/16 126/78  10/06/16 114/68  09/26/16 130/62    Wt Readings from Last 3 Encounters:  11/14/16 201 lb 1.3 oz (91.2 kg)  10/06/16 201 lb 2 oz (91.2 kg)  09/26/16 203 lb 8 oz (92.3 kg)    Physical Exam  Constitutional: She appears well-developed. No distress.  HENT:  Head: Normocephalic.  Right Ear: External ear normal.  Left Ear: External ear normal.  Nose: Nose normal.  Mouth/Throat: Oropharynx is clear and moist.  Eyes: Conjunctivae are normal. Pupils are equal, round, and reactive to light. Right eye exhibits no discharge. Left eye exhibits no discharge.  Neck: Normal range of motion. Neck supple. No JVD present. No tracheal deviation present. No thyromegaly present.  Cardiovascular: Normal rate, regular rhythm and normal heart sounds.   Pulmonary/Chest: No stridor. No respiratory distress. She has no wheezes.  Abdominal: Soft. Bowel sounds are normal. She exhibits no distension and no mass. There is no tenderness. There is no rebound and no guarding.  Musculoskeletal: She exhibits tenderness. She exhibits no edema.  Lymphadenopathy:    She has no cervical adenopathy.  Neurological: She displays normal reflexes. No cranial nerve deficit. She exhibits normal muscle tone. Coordination normal.  Skin: No rash noted. No erythema.  Psychiatric: She has a normal mood and affect. Her behavior is normal. Judgment and thought content normal.  Obese LS tander  Lab Results  Component Value Date   WBC 7.8 06/30/2016   HGB 13.9 06/30/2016   HCT 40.9 06/30/2016   PLT 221.0 06/30/2016   GLUCOSE 128 (H) 06/30/2016   CHOL 148 06/30/2016   TRIG 185.0 (H) 06/30/2016   HDL 47.30 06/30/2016   LDLCALC 63  06/30/2016   ALT 16 06/30/2016   AST 18 06/30/2016   NA 140 06/30/2016   K 4.6 06/30/2016   CL 105 06/30/2016   CREATININE 0.70 06/30/2016   BUN 19 06/30/2016   CO2 27 06/30/2016   TSH 1.90 06/30/2016   HGBA1C 6.1 06/30/2016    No results found.  Assessment & Plan:   There are no diagnoses linked to this encounter. I am having Ms. Mcelhinny maintain her Vitamin D3, umeclidinium-vilanterol, meclizine, ketoconazole, metoprolol succinate, HYDROcodone-acetaminophen, and ALPRAZolam.  No orders of the defined types were placed in this encounter.    Follow-up: No Follow-up on file.  Walker Kehr, MD

## 2016-11-14 NOTE — Patient Instructions (Signed)
MC well w/Jill 

## 2017-01-26 ENCOUNTER — Ambulatory Visit (INDEPENDENT_AMBULATORY_CARE_PROVIDER_SITE_OTHER): Payer: Medicare Other | Admitting: Internal Medicine

## 2017-01-26 ENCOUNTER — Other Ambulatory Visit (INDEPENDENT_AMBULATORY_CARE_PROVIDER_SITE_OTHER): Payer: Medicare Other

## 2017-01-26 ENCOUNTER — Encounter: Payer: Self-pay | Admitting: Internal Medicine

## 2017-01-26 DIAGNOSIS — B372 Candidiasis of skin and nail: Secondary | ICD-10-CM

## 2017-01-26 DIAGNOSIS — G8929 Other chronic pain: Secondary | ICD-10-CM

## 2017-01-26 DIAGNOSIS — E119 Type 2 diabetes mellitus without complications: Secondary | ICD-10-CM

## 2017-01-26 DIAGNOSIS — I10 Essential (primary) hypertension: Secondary | ICD-10-CM

## 2017-01-26 DIAGNOSIS — Z6836 Body mass index (BMI) 36.0-36.9, adult: Secondary | ICD-10-CM

## 2017-01-26 DIAGNOSIS — G5732 Lesion of lateral popliteal nerve, left lower limb: Secondary | ICD-10-CM | POA: Diagnosis not present

## 2017-01-26 DIAGNOSIS — M5440 Lumbago with sciatica, unspecified side: Secondary | ICD-10-CM | POA: Diagnosis not present

## 2017-01-26 LAB — TSH: TSH: 0.84 u[IU]/mL (ref 0.35–4.50)

## 2017-01-26 LAB — BASIC METABOLIC PANEL
BUN: 19 mg/dL (ref 6–23)
CO2: 27 mEq/L (ref 19–32)
Calcium: 8.8 mg/dL (ref 8.4–10.5)
Chloride: 106 mEq/L (ref 96–112)
Creatinine, Ser: 0.66 mg/dL (ref 0.40–1.20)
GFR: 94.45 mL/min (ref >60.00)
Glucose, Bld: 127 mg/dL — ABNORMAL HIGH (ref 70–99)
Potassium: 3.9 mEq/L (ref 3.5–5.1)
Sodium: 141 mEq/L (ref 135–145)

## 2017-01-26 LAB — HEMOGLOBIN A1C: Hgb A1c MFr Bld: 6.3 % (ref 4.6–6.5)

## 2017-01-26 LAB — VITAMIN B12: Vitamin B-12: 335 pg/mL (ref 211–911)

## 2017-01-26 MED ORDER — GABAPENTIN 100 MG PO CAPS: 100.0000 mg | ORAL_CAPSULE | Freq: Three times a day (TID) | ORAL | 3 refills | Status: DC | PRN

## 2017-01-26 MED ORDER — KETOCONAZOLE 2 % EX CREA: 1.0000 "application " | TOPICAL_CREAM | Freq: Two times a day (BID) | CUTANEOUS | 1 refills | Status: DC

## 2017-01-26 MED ORDER — KETOCONAZOLE 200 MG PO TABS: 200.0000 mg | ORAL_TABLET | Freq: Two times a day (BID) | ORAL | 1 refills | Status: DC

## 2017-01-26 NOTE — Assessment & Plan Note (Signed)
Toprol 

## 2017-01-26 NOTE — Assessment & Plan Note (Signed)
Wt Readings from Last 3 Encounters:  01/26/17 204 lb (92.5 kg)  11/14/16 201 lb 1.3 oz (91.2 kg)  10/06/16 201 lb 2 oz (91.2 kg)

## 2017-01-26 NOTE — Progress Notes (Signed)
Subjective:  Patient ID: Amanda Arnold, female    DOB: [birth year]  Age: 71 y.o. MRN: [id number]  CC: No chief complaint on file.   Pt is traveling to [place] - DVT prevention was discussed   Amanda Arnold presents for LBP, OA, HTN f/u C/o L lat shin burning w/walking and standing x weeks C/o rash under belly  Outpatient Medications Prior to Visit  Medication Sig Dispense Refill  . ALPRAZolam (XANAX) 0.5 MG tablet Take 1 tablet (0.5 mg total) by mouth 2 (two) times daily as needed for anxiety. 60 tablet 2  . Cholecalciferol (VITAMIN D3) 2000 UNITS capsule Take 1 capsule (2,000 Units total) by mouth daily. 100 capsule 3  . HYDROcodone-acetaminophen (NORCO) 7.5-325 MG tablet Take 1 tablet by mouth every 8 (eight) hours as needed for severe pain. 90 tablet 0  . ketoconazole (NIZORAL) 2 % cream Apply 1 application topically 2 (two) times daily. In the groin 60 g 1  . meclizine (ANTIVERT) 12.5 MG tablet Take 1-2 tablets (12.5-25 mg total) by mouth 3 (three) times daily as needed for dizziness. 60 tablet 1  . metoprolol succinate (TOPROL-XL) 25 MG 24 hr tablet Take 0.5 tablets (12.5 mg total) by mouth at bedtime. 90 tablet 3  . umeclidinium-vilanterol (ANORO ELLIPTA) 62.5-25 MCG/INH AEPB Inhale 1 puff into the lungs daily. 1 each 11   No facility-administered medications prior to visit.     ROS Review of Systems  Constitutional: Negative for activity change, appetite change, chills, fatigue and unexpected weight change.  HENT: Negative for congestion, mouth sores and sinus pressure.   Eyes: Negative for visual disturbance.  Respiratory: Negative for cough and chest tightness.   Gastrointestinal: Negative for abdominal pain and nausea.  Genitourinary: Negative for difficulty urinating, frequency and vaginal pain.  Musculoskeletal: Positive for arthralgias, back pain and gait problem.  Skin: Positive for rash. Negative for pallor.  Neurological: Positive for dizziness. Negative for  tremors, weakness, numbness and headaches.  Psychiatric/Behavioral: Negative for confusion and sleep disturbance. The patient is nervous/anxious.     Objective:  BP 124/72 (BP Location: Left Arm, Patient Position: Sitting, Cuff Size: Large)   Pulse 68   Temp 98.4 F (36.9 C) (Oral)   Ht 5\' 2"  (1.575 m)   Wt 204 lb (92.5 kg)   SpO2 99%   BMI 37.31 kg/m   BP Readings from Last 3 Encounters:  01/26/17 124/72  11/14/16 126/78  10/06/16 114/68    Wt Readings from Last 3 Encounters:  01/26/17 204 lb (92.5 kg)  11/14/16 201 lb 1.3 oz (91.2 kg)  10/06/16 201 lb 2 oz (91.2 kg)    Physical Exam  Constitutional: She appears well-developed. No distress.  HENT:  Head: Normocephalic.  Right Ear: External ear normal.  Left Ear: External ear normal.  Nose: Nose normal.  Mouth/Throat: Oropharynx is clear and moist.  Eyes: Pupils are equal, round, and reactive to light. Conjunctivae are normal. Right eye exhibits no discharge. Left eye exhibits no discharge.  Neck: Normal range of motion. Neck supple. No JVD present. No tracheal deviation present. No thyromegaly present.  Cardiovascular: Normal rate, regular rhythm and normal heart sounds.   Pulmonary/Chest: No stridor. No respiratory distress. She has no wheezes.  Abdominal: Soft. Bowel sounds are normal. She exhibits no distension and no mass. There is no tenderness. There is no rebound and no guarding.  Musculoskeletal: She exhibits no edema or tenderness.  Lymphadenopathy:    She has no cervical adenopathy.  Neurological: She  displays normal reflexes. No cranial nerve deficit. She exhibits normal muscle tone. Coordination normal.  Skin: Rash noted. There is erythema.  Psychiatric: She has a normal mood and affect. Her behavior is normal. Judgment and thought content normal.  abd intertrigo rash Obese Tender over L fibula head area No foot drop  Lab Results  Component Value Date   WBC 7.8 06/30/2016   HGB 13.9 06/30/2016   HCT  40.9 06/30/2016   PLT 221.0 06/30/2016   GLUCOSE 128 (H) 06/30/2016   CHOL 148 06/30/2016   TRIG 185.0 (H) 06/30/2016   HDL 47.30 06/30/2016   LDLCALC 63 06/30/2016   ALT 16 06/30/2016   AST 18 06/30/2016   NA 140 06/30/2016   K 4.6 06/30/2016   CL 105 06/30/2016   CREATININE 0.70 06/30/2016   BUN 19 06/30/2016   CO2 27 06/30/2016   TSH 1.90 06/30/2016   HGBA1C 6.1 06/30/2016    Dexascan  Result Date: 11/15/2016 Date of study: 11/14/16 Exam: DUAL X-RAY ABSORPTIOMETRY (DXA) FOR BONE MINERAL DENSITY (BMD) Instrument: [employer] Provider: PCP Indication: follow up for low BMD Comparison: none (please note that it is not possible to compare data from different instruments) Clinical data: Pt is a 71 y.o. female with previous history of fracture. On calcium and vitamin D. Results:  Lumbar spine (L1-L4) Femoral neck (FN) 33% distal radius T-score -0.8 RFN:-1.8 LFN:-1.9 n/a Change in BMD from previous DXA test (%) n/a n/a n/a (*) statistically significant Assessment: the BMD is low according to the [facility] classification for osteoporosis (see below). Fracture risk: moderate FRAX score: 10 year major osteoporotic risk: 17.9%. 10 year hip fracture risk: 4.5%. These are not under the thresholds for treatment of 20% and 3%, respectively. Comments: the technical quality of the study is good. Evaluation for secondary causes should be considered if clinically indicated. Recommend optimizing calcium (1200 mg/day) and vitamin D (800 IU/day) intake. Followup: Repeat BMD is appropriate after 2 years or after 1-2 years if starting treatment. WHO criteria for diagnosis of osteoporosis in postmenopausal women and in men 7 y/o or older: - normal: T-score -1.0 to + 1.0 - osteopenia/low bone density: T-score between -2.5 and -1.0 - osteoporosis: T-score below -2.5 - severe osteoporosis: T-score below -2.5 with history of fragility fracture Note: although not part of the WHO classification, the presence of  a fragility fracture, regardless of the T-score, should be considered diagnostic of osteoporosis, provided other causes for the fracture have been excluded. Treatment: The National Osteoporosis Foundation recommends that treatment be considered in postmenopausal women and men age 62 or older with: 1. Hip or vertebral (clinical or morphometric) fracture 2. T-score of - 2.5 or lower at the spine or hip 3. 10-year fracture probability by FRAX of at least 20% for a major osteoporotic fracture and 3% for a hip fracture Loura Pardon MD    Assessment & Plan:   There are no diagnoses linked to this encounter. I am having Ms. Bourquin maintain her Vitamin D3, umeclidinium-vilanterol, meclizine, ketoconazole, ALPRAZolam, HYDROcodone-acetaminophen, and metoprolol succinate.  No orders of the defined types were placed in this encounter.    Follow-up: No Follow-up on file.  Walker Kehr, MD

## 2017-01-26 NOTE — Patient Instructions (Signed)
Common Peroneal Nerve Entrapment Rehab Ask your health care provider which exercises are safe for you. Do exercises exactly as told by your health care provider and adjust them as directed. It is normal to feel mild stretching, pulling, tightness, or discomfort as you do these exercises, but you should stop right away if you feel sudden pain or your pain gets worse.Do not begin these exercises until told by your health care provider. Stretching and range of motion exercises These exercises warm up your muscles and joints and improve the movement and flexibility of your ankle. These exercises also help to relieve pain, numbness, and tingling. Exercise A: Gastroc, standing  1. Stand with your hands against a wall. 2. Extend your left / right leg behind you, and bend your front knee slightly. Your heels should be on the floor. 3. Keeping your heels on the floor and your back knee straight, shift your weight slightly toward the wall. Do not arch your back when you do this. You should feel a gentle stretch in the back of your lower leg (calf). 4. Hold this position for __________ seconds. Repeat __________ times. Complete this stretch __________ times a day. Exercise B: Soleus, standing 1. Stand with your hands against a wall. 2. Extend your left / right leg behind you, and bend your front knee slightly. Your heels should be on the floor. 3. Keeping your heels on the floor, bend your back knee and shift your weight slightly over your back leg. You should feel a gentle stretch deep in your calf. 4. Hold this position for __________ seconds. Repeat __________ times. Complete this stretch __________ times a day. Exercise C: Fibular nerve floss 1. Sit with your legs straight in front of you. 2. Slowly move your left / right foot down and inward so your toes point toward the floor and toward your other foot. 3. Hold this position for __________ seconds 4. Slowly return your foot to the starting  position. Repeat __________ times or for __________ seconds. Complete this exercise __________ times a day. Exercise D: Hamstring, standing  1. Stand with your left / right heel resting on a chair. Your left / right leg should be fully extended. 2. Arch your lower back slightly. 3. Lean forward at the waist, leading with your chest, until you feel a gentle stretch in the back of your left / right knee or thigh. You should not need to lean far to feel the stretch. 4. Hold this position for __________ seconds. Repeat __________ times. Complete this stretch __________ times a day. This information is not intended to replace advice given to you by your health care provider. Make sure you discuss any questions you have with your health care provider. Document Released: 06/30/2005 Document Revised: 03/04/2016 Document Reviewed: 05/18/2015 Elsevier Interactive Patient Education  2018 [employer]. Common Peroneal Nerve Entrapment Common peroneal nerve entrapment is a condition that can make it hard to lift a foot. The condition results from pressure on a nerve in the lower leg called the common peroneal nerve. Your common peroneal nerve provides feeling to your outer lower leg and foot. It also supplies the muscles that move your foot and toes upward and outward. What are the causes? This condition may be caused by:  A hard, direct hit to the side of the lower leg.  Swelling from a knee injury.  A break (fracture) in one of the lower leg bones.  Wearing a boot or cast that ends just below the knee.  A growth or cyst near the nerve.  What increases the risk? This condition is more likely to develop in people who play:  Contact sports, such as football or hockey.  Sports in which you wear high and stiff boots, such as skiing.  What are the signs or symptoms? Symptoms of this condition include:  Trouble lifting your foot up.  Tripping often.  Your foot hitting the ground harder than  normal as you walk.  Numbness, tingling, or pain in the outside of the knee, outside of the lower leg, and top of the foot.  Sensitivity to pressure on the front or side of the leg.  How is this diagnosed? This condition may be diagnosed based on:  Your symptoms.  Your medical history.  A physical exam.  Tests, such as: ? An X-ray to check the bones of your knee and leg. ? MRI to check tendons that attach to the side of your knee. ? An electromyogram (EMG) to check your nerves.  During your physical exam, your health care provider will check for numbness in your leg and test the strength of your lower leg muscles. He or she may tap the side of your lower leg to see if that causes tingling. How is this treated? Treatment for this condition may include:  Avoiding activities that make symptoms worse.  Using a brace to hold up your foot and toes.  Taking anti-inflammatory pain medicines to relieve swelling and reduce pain.  Having medicines injected into your ankle joint to reduce pain and swelling.  Physical therapy. This involves doing exercises.  Returning gradually to full activity.  Surgery to take pressure off the nerve. This may be needed if there is no improvement after 2-3 months or if there is a growth pushing on the nerve.  Follow these instructions at home: If you have a brace:  Wear it as told by your health care provider. Remove it only as told by your health care provider.  Loosen the brace if your toes tingle, become numb, or turn cold and blue.  Keep the brace clean.  If the brace is not waterproof: ? Do not let it get wet. ? Cover it with a watertight covering when you take a bath or a shower.  Ask your health care provider when it is safe to drive with a brace on your foot. Activity  Return to your normal activities as told by your health care provider. Ask your health care provider what activities are safe for you.  Do not do any activities that  make pain or swelling worse.  Do exercises as told by your health care provider. General instructions  Take over-the-counter and prescription medicines only as told by your health care provider.  Do not put your full weight on your knee until your health care provider says you can.  Keep all follow-up visits as told by your health care provider. This is important. How is this prevented?  Wear supportive footwear that is appropriate for your athletic activity.  Avoid athletic activities that cause ankle pain or swelling.  Wear protective padding over your lower legs when playing contact sports.  Make sure your boots do not put extra pressure on the area just below your knees.  Do not sit cross-legged for long periods of time. Contact a health care provider if:  Your symptoms do not get better in 2-3 months.  The weakness or numbness in your leg or foot gets worse. This information is not intended  to replace advice given to you by your health care provider. Make sure you discuss any questions you have with your health care provider. Document Released: 06/30/2005 Document Revised: 03/04/2016 Document Reviewed: 05/18/2015 Elsevier Interactive Patient Education  [employer].

## 2017-01-26 NOTE — Assessment & Plan Note (Signed)
PO and topical Ketoconazole Loose wt

## 2017-01-26 NOTE — Assessment & Plan Note (Signed)
Discussed See instructions Ortho  Ref if needed

## 2017-01-26 NOTE — Assessment & Plan Note (Signed)
Norco prn  Potential benefits of a long term opioids use as well as potential risks (i.e. addiction risk, apnea etc) and complications (i.e. Somnolence, constipation and others) were explained to the patient and were aknowledged. 

## 2017-01-26 NOTE — Assessment & Plan Note (Signed)
Labs

## 2017-01-27 ENCOUNTER — Telehealth: Payer: Self-pay

## 2017-01-27 MED ORDER — FLUCONAZOLE 100 MG PO TABS: ORAL_TABLET | ORAL | 1 refills | Status: DC

## 2017-01-27 NOTE — Telephone Encounter (Signed)
OK Diflucan D/c po Ketoconazole Thx

## 2017-01-27 NOTE — Telephone Encounter (Signed)
Patient called and stated she was prescribed the Ketoconazole 200 mg tablet yesterday. She believes she had this in the past and this medication has a lot of side effects. She would like to know if something else can be called in. Please advise.

## 2017-01-27 NOTE — Telephone Encounter (Signed)
Pt.notified

## 2017-02-03 ENCOUNTER — Emergency Department (HOSPITAL_COMMUNITY): Payer: Medicare Other

## 2017-02-03 ENCOUNTER — Emergency Department (HOSPITAL_COMMUNITY)
Admission: EM | Admit: 2017-02-03 | Discharge: 2017-02-03 | Disposition: A | Discharge status: Home / Self Care | Payer: Medicare Other | Attending: Emergency Medicine | Admitting: Emergency Medicine

## 2017-02-03 ENCOUNTER — Encounter (HOSPITAL_COMMUNITY): Payer: Self-pay | Admitting: Emergency Medicine

## 2017-02-03 DIAGNOSIS — Z79899 Other long term (current) drug therapy: Secondary | ICD-10-CM | POA: Diagnosis not present

## 2017-02-03 DIAGNOSIS — M79662 Pain in left lower leg: Secondary | ICD-10-CM | POA: Diagnosis not present

## 2017-02-03 DIAGNOSIS — M48061 Spinal stenosis, lumbar region without neurogenic claudication: Secondary | ICD-10-CM

## 2017-02-03 DIAGNOSIS — M5116 Intervertebral disc disorders with radiculopathy, lumbar region: Secondary | ICD-10-CM | POA: Diagnosis not present

## 2017-02-03 DIAGNOSIS — N200 Calculus of kidney: Secondary | ICD-10-CM | POA: Insufficient documentation

## 2017-02-03 DIAGNOSIS — R1084 Generalized abdominal pain: Secondary | ICD-10-CM | POA: Diagnosis present

## 2017-02-03 DIAGNOSIS — M5416 Radiculopathy, lumbar region: Secondary | ICD-10-CM

## 2017-02-03 DIAGNOSIS — J45909 Unspecified asthma, uncomplicated: Secondary | ICD-10-CM | POA: Diagnosis not present

## 2017-02-03 DIAGNOSIS — M545 Low back pain: Secondary | ICD-10-CM | POA: Diagnosis not present

## 2017-02-03 LAB — CBG MONITORING, ED: Glucose-Capillary: 128 mg/dL — ABNORMAL HIGH (ref 65–99)

## 2017-02-03 MED ORDER — METHOCARBAMOL 500 MG PO TABS: 500.0000 mg | ORAL_TABLET | Freq: Two times a day (BID) | ORAL | 0 refills | Status: AC

## 2017-02-03 MED ORDER — HYDROCODONE-ACETAMINOPHEN 5-325 MG PO TABS
1.0000 | ORAL_TABLET | Freq: Once | ORAL | Status: AC
  Administered 2017-02-03: 1 via ORAL
  Filled 2017-02-03: qty 1

## 2017-02-03 MED ORDER — PREDNISONE 10 MG PO TABS: 40.0000 mg | ORAL_TABLET | Freq: Every day | ORAL | 0 refills | Status: AC

## 2017-02-03 MED ORDER — GABAPENTIN 100 MG PO CAPS: 300.0000 mg | ORAL_CAPSULE | Freq: Two times a day (BID) | ORAL | 0 refills | Status: DC

## 2017-02-03 MED ORDER — LIDOCAINE 5 % EX PTCH: 1.0000 | MEDICATED_PATCH | CUTANEOUS | 0 refills | Status: AC

## 2017-02-03 MED ORDER — GABAPENTIN 300 MG PO CAPS
300.0000 mg | ORAL_CAPSULE | Freq: Once | ORAL | Status: AC
  Administered 2017-02-03: 300 mg via ORAL
  Filled 2017-02-03: qty 1

## 2017-02-03 NOTE — ED Triage Notes (Signed)
Patient arrives with complaint of left lower leg burning. States she was seen by her PCP for the same and was prescribed gabapentin. Explains that she took medication, but it gave her diarrhea. Endorses DM2 and that she was on metformin, but stopped, and doesn't check her blood sugar regularly.
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
## 2017-02-03 NOTE — Discharge Instructions (Signed)
You were evaluated in the emergency department for worsening left lower back pain and left leg pain. Your MRI shows progression of your known spinal stenosis and nerve root compression. We will treat your symptoms with steroids, muscle relaxers, lidocaine patches. Please continue taking your home Norco. Your dose of gabapentin will be increased to 300 mg.  You need to contact neurosurgery for further reevaluation and recommendations for possible surgery.  Return to the emergency department or seek immediate medical care if you develop worsening back pain associated with bladder or bowel incontinence or retention, groin numbness, weakness or numbness to her lower extremities.

## 2017-02-04 NOTE — Patient Outreach (Signed)
Outreach Patient after ED visit on July 24th.  Patient verified PCP.  Patient did not contact Doctor prior to ED visit.  I explained [facility] services but patient was not interested in going over engagement tool.  I explained to patient that I would mail a letter to her with our 24 hour Nurse Advice Line and if we were needed in the future to give [place] a call.

## 2017-02-16 ENCOUNTER — Ambulatory Visit: Payer: Medicare Other | Admitting: Internal Medicine

## 2017-03-18 ENCOUNTER — Ambulatory Visit: Payer: Medicare Other | Admitting: Internal Medicine

## 2017-03-19 ENCOUNTER — Ambulatory Visit (INDEPENDENT_AMBULATORY_CARE_PROVIDER_SITE_OTHER): Payer: Medicare Other | Admitting: Internal Medicine

## 2017-03-19 ENCOUNTER — Encounter: Payer: Self-pay | Admitting: Internal Medicine

## 2017-03-19 DIAGNOSIS — F4323 Adjustment disorder with mixed anxiety and depressed mood: Secondary | ICD-10-CM

## 2017-03-19 DIAGNOSIS — I10 Essential (primary) hypertension: Secondary | ICD-10-CM | POA: Diagnosis not present

## 2017-03-19 DIAGNOSIS — E119 Type 2 diabetes mellitus without complications: Secondary | ICD-10-CM | POA: Diagnosis not present

## 2017-03-19 DIAGNOSIS — R1031 Right lower quadrant pain: Secondary | ICD-10-CM

## 2017-03-19 DIAGNOSIS — M5440 Lumbago with sciatica, unspecified side: Secondary | ICD-10-CM | POA: Diagnosis not present

## 2017-03-19 DIAGNOSIS — G8929 Other chronic pain: Secondary | ICD-10-CM | POA: Diagnosis not present

## 2017-03-19 MED ORDER — HYDROCODONE-ACETAMINOPHEN 7.5-325 MG PO TABS: 1.0000 | ORAL_TABLET | Freq: Three times a day (TID) | ORAL | 0 refills | Status: DC | PRN

## 2017-03-19 NOTE — Assessment & Plan Note (Signed)
Labs Pt lost 4 lbs

## 2017-03-19 NOTE — Assessment & Plan Note (Signed)
Xanax prn  Potential benefits of a long term benzodiazepines  use as well as potential risks  and complications were explained to the patient and were aknowledged. 

## 2017-03-19 NOTE — Assessment & Plan Note (Signed)
Toprol XL 

## 2017-03-19 NOTE — Assessment & Plan Note (Signed)
Norco prn  Potential benefits of a long term opioids use as well as potential risks (i.e. addiction risk, apnea etc) and complications (i.e. Somnolence, constipation and others) were explained to the patient and were aknowledged. 

## 2017-03-19 NOTE — Progress Notes (Signed)
Subjective:  Patient ID: Amanda Arnold, female    DOB: 09/16/1945  Age: 71 y.o. MRN: [id number]  CC: Follow-up   HPI VIRIGINIA Arnold presents for anxiety, LBP, OA, HTN f/u  Outpatient Medications Prior to Visit  Medication Sig Dispense Refill  . ALPRAZolam (XANAX) 0.5 MG tablet Take 1 tablet (0.5 mg total) by mouth 2 (two) times daily as needed for anxiety. 60 tablet 2  . Cholecalciferol (VITAMIN D3) 2000 UNITS capsule Take 1 capsule (2,000 Units total) by mouth daily. 100 capsule 3  . HYDROcodone-acetaminophen (NORCO) 7.5-325 MG tablet Take 1 tablet by mouth every 8 (eight) hours as needed for severe pain. 90 tablet 0  . meclizine (ANTIVERT) 12.5 MG tablet Take 1-2 tablets (12.5-25 mg total) by mouth 3 (three) times daily as needed for dizziness. 60 tablet 1  . metoprolol succinate (TOPROL-XL) 25 MG 24 hr tablet Take 0.5 tablets (12.5 mg total) by mouth at bedtime. 90 tablet 3  . umeclidinium-vilanterol (ANORO ELLIPTA) 62.5-25 MCG/INH AEPB Inhale 1 puff into the lungs daily. 1 each 11  . fluconazole (DIFLUCAN) 100 MG tablet Take 2 tabs on day#1, then 1 tab daily on Days #2-10 (Patient not taking: Reported on 03/19/2017) 11 tablet 1  . gabapentin (NEURONTIN) 100 MG capsule Take 3 capsules (300 mg total) by mouth 2 (two) times daily. (Patient not taking: Reported on 03/19/2017) 30 capsule 0  . ketoconazole (NIZORAL) 2 % cream Apply 1 application topically 2 (two) times daily. In the groin (Patient not taking: Reported on 03/19/2017) 60 g 1   No facility-administered medications prior to visit.     ROS Review of Systems  Constitutional: Positive for fatigue. Negative for activity change, appetite change, chills and unexpected weight change.  HENT: Negative for congestion, mouth sores and sinus pressure.   Eyes: Negative for visual disturbance.  Respiratory: Negative for cough and chest tightness.   Gastrointestinal: Negative for abdominal pain and nausea.  Genitourinary: Negative for  difficulty urinating, frequency and vaginal pain.  Musculoskeletal: Positive for back pain. Negative for gait problem.  Skin: Negative for pallor and rash.  Neurological: Negative for dizziness, tremors, weakness, numbness and headaches.  Psychiatric/Behavioral: Negative for confusion and sleep disturbance.    Objective:  BP 118/78   Pulse (!) 56   Temp 98 F (36.7 C) (Oral)   Ht 5\' 2"  (1.575 m)   Wt 200 lb 12.8 oz (91.1 kg)   SpO2 97%   BMI 36.73 kg/m   BP Readings from Last 3 Encounters:  03/19/17 118/78  02/03/17 (!) 121/48  01/26/17 124/72    Wt Readings from Last 3 Encounters:  03/19/17 200 lb 12.8 oz (91.1 kg)  02/03/17 204 lb (92.5 kg)  01/26/17 204 lb (92.5 kg)    Physical Exam  Constitutional: She appears well-developed. No distress.  HENT:  Head: Normocephalic.  Right Ear: External ear normal.  Left Ear: External ear normal.  Nose: Nose normal.  Mouth/Throat: Oropharynx is clear and moist.  Eyes: Pupils are equal, round, and reactive to light. Conjunctivae are normal. Right eye exhibits no discharge. Left eye exhibits no discharge.  Neck: Normal range of motion. Neck supple. No JVD present. No tracheal deviation present. No thyromegaly present.  Cardiovascular: Normal rate, regular rhythm and normal heart sounds.   Pulmonary/Chest: No stridor. No respiratory distress. She has no wheezes.  Abdominal: Soft. Bowel sounds are normal. She exhibits no distension and no mass. There is no tenderness. There is no rebound and no guarding.  Musculoskeletal: She exhibits tenderness. She exhibits no edema.  Lymphadenopathy:    She has no cervical adenopathy.  Neurological: She displays normal reflexes. No cranial nerve deficit. She exhibits normal muscle tone. Coordination normal.  Skin: No rash noted. No erythema.  Psychiatric: She has a normal mood and affect. Her behavior is normal. Judgment and thought content normal.   Obese LS - tender  Lab Results  Component  Value Date   WBC 7.8 06/30/2016   HGB 13.9 06/30/2016   HCT 40.9 06/30/2016   PLT 221.0 06/30/2016   GLUCOSE 127 (H) 01/26/2017   CHOL 148 06/30/2016   TRIG 185.0 (H) 06/30/2016   HDL 47.30 06/30/2016   LDLCALC 63 06/30/2016   ALT 16 06/30/2016   AST 18 06/30/2016   NA 141 01/26/2017   K 3.9 01/26/2017   CL 106 01/26/2017   CREATININE 0.66 01/26/2017   BUN 19 01/26/2017   CO2 27 01/26/2017   TSH 0.84 01/26/2017   HGBA1C 6.3 01/26/2017    Mr Lumbar Spine Wo Contrast  Result Date: 02/03/2017 CLINICAL DATA:  Low back pain with left leg pain EXAM: MRI LUMBAR SPINE WITHOUT CONTRAST TECHNIQUE: Multiplanar, multisequence MR imaging of the lumbar spine was performed. No intravenous contrast was administered. COMPARISON:  Lumbar MRI 04/01/2013 FINDINGS: Segmentation:  Normal Alignment: Mild retrolisthesis L1-2. 4 mm retrolisthesis L2-3. 3 mm anterolisthesis L3-4. Vertebrae: Negative for fracture or mass. Discogenic changes on the right at L4-5 have progressed Conus medullaris: Extends to the L1-2 level and appears normal. Incidental note is made of fatty infiltration of the filum terminale. Paraspinal and other soft tissues: Negative Disc levels: T12-L1:  Mild disc degeneration L1-2:  Mild disc degeneration without stenosis L2-3: Disc degeneration with diffuse disc bulging and mild spinal stenosis. Progression of stenosis and degenerative change since the prior MRI. Subarticular narrowing bilaterally. L3-4: Mild anterolisthesis. Diffuse disc bulging and moderate facet degeneration. Mild-to-moderate spinal stenosis with progression since the prior study. L4-5: Progression of disc degeneration. Progressive disc space narrowing right greater than left with associated discogenic sclerosis and edema. Diffuse disc bulging and spurring. Broad-based left-sided disc protrusion has progressed with subarticular stenosis and compression of the left L5 nerve root. Mild left foraminal narrowing. Mild right  foraminal narrowing. Mild spinal stenosis. L5-S1: Progression of disc degeneration with diffuse spurring. Moderate left foraminal narrowing, unchanged. 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 Broad-based left-sided disc protrusion with compression of left L5 nerve root. Mild foraminal narrowing bilaterally and mild spinal stenosis Moderate left foraminal narrowing   L5-S1 unchanged. Electronically Signed   By: Franchot Gallo M.D.   On: 02/03/2017 08:54    Assessment & Plan:   There are no diagnoses linked to this encounter. I am having Ms. Laforte maintain her Vitamin D3, umeclidinium-vilanterol, meclizine, ALPRAZolam, HYDROcodone-acetaminophen, metoprolol succinate, ketoconazole, fluconazole, and gabapentin.  No orders of the defined types were placed in this encounter.    Follow-up: No Follow-up on file.  Walker Kehr, MD

## 2017-03-19 NOTE — Patient Instructions (Signed)
   Milk free trial (no milk, ice cream, cheese and yogurt) for 4-6 weeks. OK to use almond, coconut, rice or soy milk. "Almond breeze" brand tastes good.  

## 2017-04-10 DIAGNOSIS — E119 Type 2 diabetes mellitus without complications: Secondary | ICD-10-CM | POA: Diagnosis not present

## 2017-04-10 DIAGNOSIS — H26491 Other secondary cataract, right eye: Secondary | ICD-10-CM | POA: Diagnosis not present

## 2017-04-10 DIAGNOSIS — H52203 Unspecified astigmatism, bilateral: Secondary | ICD-10-CM | POA: Diagnosis not present

## 2017-04-10 DIAGNOSIS — H5711 Ocular pain, right eye: Secondary | ICD-10-CM | POA: Diagnosis not present

## 2017-04-10 LAB — HM DIABETES EYE EXAM

## 2017-06-02 ENCOUNTER — Encounter: Payer: Self-pay | Admitting: Family Medicine

## 2017-06-02 ENCOUNTER — Ambulatory Visit: Payer: Medicare Other | Admitting: Family Medicine

## 2017-06-02 VITALS — BP 130/64 | HR 73 | Temp 98.0°F | Ht 62.0 in | Wt 202.8 lb

## 2017-06-02 DIAGNOSIS — J069 Acute upper respiratory infection, unspecified: Secondary | ICD-10-CM | POA: Diagnosis not present

## 2017-06-02 DIAGNOSIS — J209 Acute bronchitis, unspecified: Secondary | ICD-10-CM

## 2017-06-02 DIAGNOSIS — J440 Chronic obstructive pulmonary disease with acute lower respiratory infection: Secondary | ICD-10-CM

## 2017-06-02 MED ORDER — BENZONATATE 100 MG PO CAPS: 100.0000 mg | ORAL_CAPSULE | Freq: Two times a day (BID) | ORAL | 0 refills | Status: DC | PRN

## 2017-06-02 MED ORDER — PREDNISONE 20 MG PO TABS: 40.0000 mg | ORAL_TABLET | Freq: Every day | ORAL | 0 refills | Status: DC

## 2017-06-02 NOTE — Progress Notes (Signed)
HPI:  Acute visit for respiratory illness: -started:3-4 days ago -symptoms:nasal congestion, sore throat, cough, laryngitis, occ wheeze -denies:fever, SOB, NVD, tooth pain, body aches, thick sputum -has tried: nothing -sick contacts/travel/risks: no reported flu, strep or tick exposure -Hx of: smoking and COPD, not taking her inhalers - feels didn't need them ROS: See pertinent positives and negatives per HPI.  Past Medical History:  Diagnosis Date  . Abdominal pain, epigastric 12/18/2009  . ANXIETY 05/25/2006  . BRONCHITIS, ACUTE 12/08/2007  . COPD 05/25/2006  . DEPRESSION 01/02/2006  . ESOTROPIA, LEFT EYE 06/02/48  . FIBROMYALGIA 04/02/2006  . Headache(784.0) 12/08/2007  . HYPERGLYCEMIA 05/15/2010  . HYPERLIPIDEMIA 04/30/2006  . HYPERTENSION 05/25/2006  . Hypertension   . INSOMNIA, PERSISTENT 02/22/2008  . Lazy eye    left  . LOW BACK PAIN 01/27/2007  . Migraines   . 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 OBSTRUCTIVE SLEEP APNEA 04/17/2010   does not use CPAP  . OSTEOARTHRITIS 05/25/2006  . Polyuria   . Pre-diabetes   . Psychosomatic disease 2011  . 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 T M J 05/11/2009    Past Surgical History:  Procedure Laterality Date  . CHOLECYSTECTOMY    . MASS EXCISION Left 10/06/2016   Procedure: EXCISION LEFT BUTTOCKS MASS;  Surgeon: Alphonsa Overall, MD;  Location: [facility];  Service: General;  Laterality: Left;    Family History  Problem Relation Age of Onset  . Heart attack Father   . Hypertension Brother   . Heart disease Mother   . Colon cancer Neg Hx   . Stomach cancer Neg Hx     Social History   Socioeconomic History  . Marital status: Married    Spouse name: None  . Number of children: 5  . Years of education: None  . Highest education level: None  Social Needs  . Financial resource strain: None  . Food insecurity - worry: None  . Food insecurity - inability: None  . Transportation needs - medical: None  .  Transportation needs - non-medical: None  Occupational History  . Occupation: house wife    Employer: UNEMPLOYED  Tobacco Use  . Smoking status: Current Some Day Smoker    Types: Cigarettes    Last attempt to quit: 07/10/2013    Years since quitting: 3.8  . Smokeless tobacco: Current User    Types: Snuff  . Tobacco comment: smokes 2-4 cigs, maybe more when anxious  Substance and Sexual Activity  . Alcohol use: No    Alcohol/week: 0.0 oz  . Drug use: No  . Sexual activity: Not Currently  Other Topics Concern  . None  Social History Narrative  . None     Current Outpatient Medications:  .  ALPRAZolam (XANAX) 0.5 MG tablet, Take 1 tablet (0.5 mg total) by mouth 2 (two) times daily as needed for anxiety., Disp: 60 tablet, Rfl: 2 .  Cholecalciferol (VITAMIN D3) 2000 UNITS capsule, Take 1 capsule (2,000 Units total) by mouth daily., Disp: 100 capsule, Rfl: 3 .  fluconazole (DIFLUCAN) 100 MG tablet, Take 2 tabs on day#1, then 1 tab daily on Days #2-10, Disp: 11 tablet, Rfl: 1 .  HYDROcodone-acetaminophen (NORCO) 7.5-325 MG tablet, Take 1 tablet by mouth every 8 (eight) hours as needed for severe pain., Disp: 90 tablet, Rfl: 0 .  ketoconazole (NIZORAL) 2 % cream, Apply 1 application topically 2 (two) times daily. In the groin, Disp: 60 g, Rfl: 1 .  meclizine (ANTIVERT) 12.5 MG tablet, Take 1-2  tablets (12.5-25 mg total) by mouth 3 (three) times daily as needed for dizziness., Disp: 60 tablet, Rfl: 1 .  metoprolol succinate (TOPROL-XL) 25 MG 24 hr tablet, Take 0.5 tablets (12.5 mg total) by mouth at bedtime., Disp: 90 tablet, Rfl: 3 .  umeclidinium-vilanterol (ANORO ELLIPTA) 62.5-25 MCG/INH AEPB, Inhale 1 puff into the lungs daily., Disp: 1 each, Rfl: 11 .  benzonatate (TESSALON) 100 MG capsule, Take 1 capsule (100 mg total) by mouth 2 (two) times daily as needed for cough., Disp: 20 capsule, Rfl: 0 .  gabapentin (NEURONTIN) 100 MG capsule, Take 3 capsules (300 mg total) by mouth 2 (two)  times daily. (Patient not taking: Reported on 03/19/2017), Disp: 30 capsule, Rfl: 0 .  predniSONE (DELTASONE) 20 MG tablet, Take 2 tablets (40 mg total) by mouth daily with breakfast., Disp: 8 tablet, Rfl: 0  EXAM:  Vitals:   06/02/17 1626  BP: 130/64  Pulse: 73  Temp: 98 F (36.7 C)  SpO2: 98%    Body mass index is 37.09 kg/m.  GENERAL: vitals reviewed and listed above, alert, oriented, appears well hydrated and in no acute distress  HEENT: atraumatic, conjunttiva clear, no obvious abnormalities on inspection of external nose and ears, normal appearance of ear canals and TMs, clear nasal congestion, mild post oropharyngeal erythema with PND, no tonsillar edema or exudate, no sinus TTP  NECK: no obvious masses on inspection  LUNGS: clear to auscultation bilaterally, no wheezes, rales or rhonchi, good air movement  CV: HRRR, no peripheral edema  MS: moves all extremities without noticeable abnormality  PSYCH: pleasant and cooperative, no obvious depression or anxiety  ASSESSMENT AND PLAN:  Discussed the following assessment and plan:  No diagnosis found.  -given HPI and exam findings today, a serious infection or illness is unlikely. We discussed potential etiologies, with VURI being most likely with possible mild COPD exacerbation, and advised supportive care and monitoring. We discussed treatment side effects, likely course, antibiotic misuse, transmission, and signs of developing a serious illness. -steroid burst after discussion risks, advised to monitor sugars and notify treating physician if issues -tessalon as needed for cough -of course, we advised to return or notify a doctor immediately if symptoms worsen or persist or new concerns arise.    Patient Instructions  Take the prednisone as instructed.  Cough medication as needed.  I hope you are feeling better soon! Seek care promptly if your symptoms worsen, new concerns arise or you are not improving with  treatment.      Colin Benton R., DO

## 2017-06-02 NOTE — Patient Instructions (Signed)
Take the prednisone as instructed.  Cough medication as needed.  I hope you are feeling better soon! Seek care promptly if your symptoms worsen, new concerns arise or you are not improving with treatment.

## 2017-06-18 ENCOUNTER — Ambulatory Visit (INDEPENDENT_AMBULATORY_CARE_PROVIDER_SITE_OTHER): Payer: Medicare Other | Admitting: Internal Medicine

## 2017-06-18 ENCOUNTER — Encounter: Payer: Self-pay | Admitting: Internal Medicine

## 2017-06-18 DIAGNOSIS — E119 Type 2 diabetes mellitus without complications: Secondary | ICD-10-CM | POA: Diagnosis not present

## 2017-06-18 DIAGNOSIS — R1031 Right lower quadrant pain: Secondary | ICD-10-CM | POA: Diagnosis not present

## 2017-06-18 DIAGNOSIS — F32A Depression, unspecified: Secondary | ICD-10-CM

## 2017-06-18 DIAGNOSIS — I10 Essential (primary) hypertension: Secondary | ICD-10-CM

## 2017-06-18 DIAGNOSIS — F419 Anxiety disorder, unspecified: Secondary | ICD-10-CM

## 2017-06-18 DIAGNOSIS — G8929 Other chronic pain: Secondary | ICD-10-CM | POA: Diagnosis not present

## 2017-06-18 DIAGNOSIS — F329 Major depressive disorder, single episode, unspecified: Secondary | ICD-10-CM

## 2017-06-18 DIAGNOSIS — J209 Acute bronchitis, unspecified: Secondary | ICD-10-CM | POA: Diagnosis not present

## 2017-06-18 DIAGNOSIS — M5440 Lumbago with sciatica, unspecified side: Secondary | ICD-10-CM

## 2017-06-18 MED ORDER — AZITHROMYCIN 250 MG PO TABS: ORAL_TABLET | ORAL | 0 refills | Status: DC

## 2017-06-18 MED ORDER — ALPRAZOLAM 0.5 MG PO TABS: 0.5000 mg | ORAL_TABLET | Freq: Two times a day (BID) | ORAL | 2 refills | Status: DC | PRN

## 2017-06-18 MED ORDER — HYDROCODONE-ACETAMINOPHEN 7.5-325 MG PO TABS: 1.0000 | ORAL_TABLET | Freq: Three times a day (TID) | ORAL | 0 refills | Status: DC | PRN

## 2017-06-18 NOTE — Assessment & Plan Note (Signed)
Norco prn  Potential benefits of a long term opioids use as well as potential risks (i.e. addiction risk, apnea etc) and complications (i.e. Somnolence, constipation and others) were explained to the patient and were aknowledged. 

## 2017-06-18 NOTE — Assessment & Plan Note (Signed)
Zpac 

## 2017-06-18 NOTE — Progress Notes (Signed)
Subjective:  Patient ID: Amanda Arnold, female    DOB: 08/16/1945  Age: 71 y.o. MRN: [id number]  CC: No chief complaint on file.   HPI DERICKA OSTENSON presents for LBP, anxiety, HTN f/u. C/o URI sxs recently - resolved, then started again  Outpatient Medications Prior to Visit  Medication Sig Dispense Refill  . ALPRAZolam (XANAX) 0.5 MG tablet Take 1 tablet (0.5 mg total) by mouth 2 (two) times daily as needed for anxiety. 60 tablet 2  . benzonatate (TESSALON) 100 MG capsule Take 1 capsule (100 mg total) by mouth 2 (two) times daily as needed for cough. 20 capsule 0  . Cholecalciferol (VITAMIN D3) 2000 UNITS capsule Take 1 capsule (2,000 Units total) by mouth daily. 100 capsule 3  . fluconazole (DIFLUCAN) 100 MG tablet Take 2 tabs on day#1, then 1 tab daily on Days #2-10 11 tablet 1  . HYDROcodone-acetaminophen (NORCO) 7.5-325 MG tablet Take 1 tablet by mouth every 8 (eight) hours as needed for severe pain. 90 tablet 0  . ketoconazole (NIZORAL) 2 % cream Apply 1 application topically 2 (two) times daily. In the groin 60 g 1  . meclizine (ANTIVERT) 12.5 MG tablet Take 1-2 tablets (12.5-25 mg total) by mouth 3 (three) times daily as needed for dizziness. 60 tablet 1  . metoprolol succinate (TOPROL-XL) 25 MG 24 hr tablet Take 0.5 tablets (12.5 mg total) by mouth at bedtime. 90 tablet 3  . predniSONE (DELTASONE) 20 MG tablet Take 2 tablets (40 mg total) by mouth daily with breakfast. 8 tablet 0  . umeclidinium-vilanterol (ANORO ELLIPTA) 62.5-25 MCG/INH AEPB Inhale 1 puff into the lungs daily. 1 each 11  . gabapentin (NEURONTIN) 100 MG capsule Take 3 capsules (300 mg total) by mouth 2 (two) times daily. (Patient not taking: Reported on 03/19/2017) 30 capsule 0   No facility-administered medications prior to visit.     ROS Review of Systems  Constitutional: Positive for fatigue. Negative for activity change, appetite change, chills and unexpected weight change.  HENT: Positive for  congestion and sinus pain. Negative for mouth sores and sinus pressure.   Eyes: Negative for visual disturbance.  Respiratory: Positive for cough. Negative for chest tightness.   Gastrointestinal: Negative for abdominal pain and nausea.  Genitourinary: Positive for frequency. Negative for difficulty urinating and vaginal pain.  Musculoskeletal: Positive for back pain. Negative for gait problem.  Skin: Negative for pallor and rash.  Neurological: Negative for dizziness, tremors, weakness, numbness and headaches.  Psychiatric/Behavioral: Negative for confusion, sleep disturbance and suicidal ideas. The patient is nervous/anxious.     Objective:  BP 122/76 (BP Location: Left Arm, Patient Position: Sitting, Cuff Size: Large)   Pulse 61   Temp 98.3 F (36.8 C) (Oral)   Ht 5\' 2"  (1.575 m)   Wt 204 lb (92.5 kg)   SpO2 98%   BMI 37.31 kg/m   BP Readings from Last 3 Encounters:  06/18/17 122/76  06/02/17 130/64  03/19/17 118/78    Wt Readings from Last 3 Encounters:  06/18/17 204 lb (92.5 kg)  06/02/17 202 lb 12.8 oz (92 kg)  03/19/17 200 lb 12.8 oz (91.1 kg)    Physical Exam  Constitutional: She appears well-developed. No distress.  HENT:  Head: Normocephalic.  Right Ear: External ear normal.  Left Ear: External ear normal.  Nose: Nose normal.  Mouth/Throat: Oropharynx is clear and moist.  Eyes: Conjunctivae are normal. Pupils are equal, round, and reactive to light. Right eye exhibits no discharge. Left  eye exhibits no discharge.  Neck: Normal range of motion. Neck supple. No JVD present. No tracheal deviation present. No thyromegaly present.  Cardiovascular: Normal rate, regular rhythm and normal heart sounds.  Pulmonary/Chest: No stridor. No respiratory distress. She has no wheezes.  Abdominal: Soft. Bowel sounds are normal. She exhibits no distension and no mass. There is no tenderness. There is no rebound and no guarding.  Musculoskeletal: She exhibits tenderness. She  exhibits no edema.  Lymphadenopathy:    She has no cervical adenopathy.  Neurological: She displays normal reflexes. No cranial nerve deficit. She exhibits normal muscle tone. Coordination normal.  Skin: No rash noted. No erythema.  Psychiatric: She has a normal mood and affect. Her behavior is normal. Judgment and thought content normal.  obese LS is tender  Lab Results  Component Value Date   WBC 7.8 06/30/2016   HGB 13.9 06/30/2016   HCT 40.9 06/30/2016   PLT 221.0 06/30/2016   GLUCOSE 127 (H) 01/26/2017   CHOL 148 06/30/2016   TRIG 185.0 (H) 06/30/2016   HDL 47.30 06/30/2016   LDLCALC 63 06/30/2016   ALT 16 06/30/2016   AST 18 06/30/2016   NA 141 01/26/2017   K 3.9 01/26/2017   CL 106 01/26/2017   CREATININE 0.66 01/26/2017   BUN 19 01/26/2017   CO2 27 01/26/2017   TSH 0.84 01/26/2017   HGBA1C 6.3 01/26/2017    Mr Lumbar Spine Wo Contrast  Result Date: 02/03/2017 CLINICAL DATA:  Low back pain with left leg pain EXAM: MRI LUMBAR SPINE WITHOUT CONTRAST TECHNIQUE: Multiplanar, multisequence MR imaging of the lumbar spine was performed. No intravenous contrast was administered. COMPARISON:  Lumbar MRI 04/01/2013 FINDINGS: Segmentation:  Normal Alignment: Mild retrolisthesis L1-2. 4 mm retrolisthesis L2-3. 3 mm anterolisthesis L3-4. Vertebrae: Negative for fracture or mass. Discogenic changes on the right at L4-5 have progressed Conus medullaris: Extends to the L1-2 level and appears normal. Incidental note is made of fatty infiltration of the filum terminale. Paraspinal and other soft tissues: Negative Disc levels: T12-L1:  Mild disc degeneration L1-2:  Mild disc degeneration without stenosis L2-3: Disc degeneration with diffuse disc bulging and mild spinal stenosis. Progression of stenosis and degenerative change since the prior MRI. Subarticular narrowing bilaterally. L3-4: Mild anterolisthesis. Diffuse disc bulging and moderate facet degeneration. Mild-to-moderate spinal stenosis  with progression since the prior study. L4-5: Progression of disc degeneration. Progressive disc space narrowing right greater than left with associated discogenic sclerosis and edema. Diffuse disc bulging and spurring. Broad-based left-sided disc protrusion has progressed with subarticular stenosis and compression of the left L5 nerve root. Mild left foraminal narrowing. Mild right foraminal narrowing. Mild spinal stenosis. L5-S1: Progression of disc degeneration with diffuse spurring. Moderate left foraminal narrowing, unchanged. 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 Broad-based left-sided disc protrusion with compression of left L5 nerve root. Mild foraminal narrowing bilaterally and mild spinal stenosis Moderate left foraminal narrowing   L5-S1 unchanged. Electronically Signed   By: Franchot Gallo M.D.   On: 02/03/2017 08:54    Assessment & Plan:   There are no diagnoses linked to this encounter. I am having Patrick North. Wickizer maintain her Vitamin D3, umeclidinium-vilanterol, meclizine, ALPRAZolam, metoprolol succinate, ketoconazole, fluconazole, gabapentin, HYDROcodone-acetaminophen, predniSONE, and benzonatate.  No orders of the defined types were placed in this encounter.    Follow-up: No Follow-up on file.  Walker Kehr, MD

## 2017-06-18 NOTE — Assessment & Plan Note (Signed)
On Toprol XL

## 2017-06-18 NOTE — Assessment & Plan Note (Signed)
  On diet  

## 2017-06-18 NOTE — Assessment & Plan Note (Signed)
Xanax prn  Potential benefits of a long term benzodiazepines  use as well as potential risks  and complications were explained to the patient and were aknowledged. 

## 2017-06-18 NOTE — Patient Instructions (Signed)
You can use over-the-counter  "cold" medicines  such as  "Afrin" nasal spray for nasal congestion as directed. Use " Delsym" or" Robitussin" cough syrup varietis for cough.  You can use plain "Tylenol" or "Advil" for fever, chills and achyness. Use Halls or Ricola cough drops.     Please, make an appointment if you are not better or if you're worse.  

## 2017-08-06 ENCOUNTER — Other Ambulatory Visit: Payer: Self-pay | Admitting: Internal Medicine

## 2017-08-06 DIAGNOSIS — Z1231 Encounter for screening mammogram for malignant neoplasm of breast: Secondary | ICD-10-CM

## 2017-09-04 ENCOUNTER — Ambulatory Visit: Payer: Medicare Other

## 2017-09-17 ENCOUNTER — Other Ambulatory Visit (INDEPENDENT_AMBULATORY_CARE_PROVIDER_SITE_OTHER): Payer: Medicare Other

## 2017-09-17 ENCOUNTER — Ambulatory Visit (INDEPENDENT_AMBULATORY_CARE_PROVIDER_SITE_OTHER): Payer: Medicare Other | Admitting: Internal Medicine

## 2017-09-17 ENCOUNTER — Encounter: Payer: Self-pay | Admitting: Internal Medicine

## 2017-09-17 ENCOUNTER — Ambulatory Visit
Admission: RE | Admit: 2017-09-17 | Discharge: 2017-09-17 | Disposition: A | Discharge status: Home / Self Care | Payer: Medicare Other | Source: Ambulatory Visit | Attending: Internal Medicine | Admitting: Internal Medicine

## 2017-09-17 VITALS — BP 118/78 | HR 55 | Temp 97.9°F | Ht 62.0 in | Wt 206.0 lb

## 2017-09-17 DIAGNOSIS — Z0001 Encounter for general adult medical examination with abnormal findings: Secondary | ICD-10-CM

## 2017-09-17 DIAGNOSIS — M5430 Sciatica, unspecified side: Secondary | ICD-10-CM

## 2017-09-17 DIAGNOSIS — R21 Rash and other nonspecific skin eruption: Secondary | ICD-10-CM

## 2017-09-17 DIAGNOSIS — E785 Hyperlipidemia, unspecified: Secondary | ICD-10-CM

## 2017-09-17 DIAGNOSIS — I10 Essential (primary) hypertension: Secondary | ICD-10-CM

## 2017-09-17 DIAGNOSIS — R42 Dizziness and giddiness: Secondary | ICD-10-CM

## 2017-09-17 DIAGNOSIS — Z23 Encounter for immunization: Secondary | ICD-10-CM

## 2017-09-17 DIAGNOSIS — Z0000 Encounter for general adult medical examination without abnormal findings: Secondary | ICD-10-CM

## 2017-09-17 DIAGNOSIS — B372 Candidiasis of skin and nail: Secondary | ICD-10-CM | POA: Diagnosis not present

## 2017-09-17 DIAGNOSIS — N959 Unspecified menopausal and perimenopausal disorder: Secondary | ICD-10-CM

## 2017-09-17 DIAGNOSIS — Z79899 Other long term (current) drug therapy: Secondary | ICD-10-CM | POA: Diagnosis not present

## 2017-09-17 DIAGNOSIS — E119 Type 2 diabetes mellitus without complications: Secondary | ICD-10-CM

## 2017-09-17 LAB — BASIC METABOLIC PANEL
BUN: 13 mg/dL (ref 6–23)
CO2: 28 mEq/L (ref 19–32)
Calcium: 9.4 mg/dL (ref 8.4–10.5)
Chloride: 105 mEq/L (ref 96–112)
Creatinine, Ser: 0.71 mg/dL (ref 0.40–1.20)
GFR: 86.65 mL/min (ref >60.00)
Glucose, Bld: 111 mg/dL — ABNORMAL HIGH (ref 70–99)
Potassium: 4.2 mEq/L (ref 3.5–5.1)
Sodium: 140 mEq/L (ref 135–145)

## 2017-09-17 LAB — LIPID PANEL
Cholesterol: 167 mg/dL (ref 0–200)
HDL: 53 mg/dL (ref >39.00)
LDL Cholesterol: 96 mg/dL (ref 0–99)
NonHDL: 114.33
Total CHOL/HDL Ratio: 3
Triglycerides: 92 mg/dL (ref 0.0–149.0)
VLDL: 18.4 mg/dL (ref 0.0–40.0)

## 2017-09-17 LAB — HEMOGLOBIN A1C: Hgb A1c MFr Bld: 6.3 % (ref 4.6–6.5)

## 2017-09-17 LAB — HEPATIC FUNCTION PANEL
ALT: 30 U/L (ref 0–35)
AST: 31 U/L (ref 0–37)
Albumin: 4.1 g/dL (ref 3.5–5.2)
Alkaline Phosphatase: 78 U/L (ref 39–117)
Bilirubin, Direct: 0.1 mg/dL (ref 0.0–0.3)
Total Bilirubin: 0.4 mg/dL (ref 0.2–1.2)
Total Protein: 7 g/dL (ref 6.0–8.3)

## 2017-09-17 LAB — TSH: TSH: 1.5 u[IU]/mL (ref 0.35–4.50)

## 2017-09-17 MED ORDER — HYDROCODONE-ACETAMINOPHEN 7.5-325 MG PO TABS: 1.0000 | ORAL_TABLET | Freq: Three times a day (TID) | ORAL | 0 refills | Status: DC | PRN

## 2017-09-17 MED ORDER — KETOCONAZOLE 2 % EX CREA: 1.0000 "application " | TOPICAL_CREAM | Freq: Two times a day (BID) | CUTANEOUS | 1 refills | Status: DC

## 2017-09-17 MED ORDER — MECLIZINE HCL 12.5 MG PO TABS
12.5000 mg | ORAL_TABLET | Freq: Three times a day (TID) | ORAL | 1 refills | Status: DC | PRN
Start: 2017-09-17 — End: 2020-05-28

## 2017-09-17 NOTE — Assessment & Plan Note (Signed)
Rx ketoconazole cream

## 2017-09-17 NOTE — Addendum Note (Signed)
Addended by: Karren Cobble on: 09/17/2017 09:56 AM   Modules accepted: Orders

## 2017-09-17 NOTE — Assessment & Plan Note (Signed)
Meclizine prn 

## 2017-09-17 NOTE — Assessment & Plan Note (Signed)
Norco prn  Potential benefits of a long term opioids use as well as potential risks (i.e. addiction risk, apnea etc) and complications (i.e. Somnolence, constipation and others) were explained to the patient and were aknowledged. 

## 2017-09-17 NOTE — Progress Notes (Signed)
Subjective:  Patient ID: Amanda Arnold, female    DOB: 03-09-1946  Age: 72 y.o. MRN: [id number]  CC: No chief complaint on file.   HPI SHYONNA CARLIN presents for LBP, LLE pain, anxiety No vertigo lately Well exam  Outpatient Medications Prior to Visit  Medication Sig Dispense Refill  . ALPRAZolam (XANAX) 0.5 MG tablet Take 1 tablet (0.5 mg total) by mouth 2 (two) times daily as needed for anxiety. 60 tablet 2  . Cholecalciferol (VITAMIN D3) 2000 UNITS capsule Take 1 capsule (2,000 Units total) by mouth daily. 100 capsule 3  . fluconazole (DIFLUCAN) 100 MG tablet Take 2 tabs on day#1, then 1 tab daily on Days #2-10 11 tablet 1  . HYDROcodone-acetaminophen (NORCO) 7.5-325 MG tablet Take 1 tablet by mouth every 8 (eight) hours as needed for severe pain. 90 tablet 0  . ketoconazole (NIZORAL) 2 % cream Apply 1 application topically 2 (two) times daily. In the groin 60 g 1  . meclizine (ANTIVERT) 12.5 MG tablet Take 1-2 tablets (12.5-25 mg total) by mouth 3 (three) times daily as needed for dizziness. 60 tablet 1  . metoprolol succinate (TOPROL-XL) 25 MG 24 hr tablet Take 0.5 tablets (12.5 mg total) by mouth at bedtime. 90 tablet 3  . predniSONE (DELTASONE) 20 MG tablet Take 2 tablets (40 mg total) by mouth daily with breakfast. 8 tablet 0  . umeclidinium-vilanterol (ANORO ELLIPTA) 62.5-25 MCG/INH AEPB Inhale 1 puff into the lungs daily. 1 each 11  . gabapentin (NEURONTIN) 100 MG capsule Take 3 capsules (300 mg total) by mouth 2 (two) times daily. (Patient not taking: Reported on 03/19/2017) 30 capsule 0  . azithromycin (ZITHROMAX Z-PAK) 250 MG tablet As directed 6 each 0  . benzonatate (TESSALON) 100 MG capsule Take 1 capsule (100 mg total) by mouth 2 (two) times daily as needed for cough. 20 capsule 0   No facility-administered medications prior to visit.     ROS Review of Systems  Constitutional: Negative for activity change, appetite change, chills, fatigue and unexpected weight  change.  HENT: Negative for congestion, mouth sores and sinus pressure.   Eyes: Negative for visual disturbance.  Respiratory: Negative for cough and chest tightness.   Gastrointestinal: Negative for abdominal pain and nausea.  Genitourinary: Negative for difficulty urinating, frequency and vaginal pain.  Musculoskeletal: Positive for back pain. Negative for gait problem.  Skin: Negative for pallor and rash.  Neurological: Positive for dizziness. Negative for tremors, weakness, numbness and headaches.  Psychiatric/Behavioral: Negative for confusion and sleep disturbance.    Objective:  BP 118/78 (BP Location: Left Arm, Patient Position: Sitting, Cuff Size: Large)   Pulse (!) 55   Temp 97.9 F (36.6 C) (Oral)   Ht 5\' 2"  (1.575 m)   Wt 206 lb (93.4 kg)   SpO2 98%   BMI 37.68 kg/m   BP Readings from Last 3 Encounters:  09/17/17 118/78  06/18/17 122/76  06/02/17 130/64    Wt Readings from Last 3 Encounters:  09/17/17 206 lb (93.4 kg)  06/18/17 204 lb (92.5 kg)  06/02/17 202 lb 12.8 oz (92 kg)    Physical Exam  Constitutional: She appears well-developed. No distress.  HENT:  Head: Normocephalic.  Right Ear: External ear normal.  Left Ear: External ear normal.  Nose: Nose normal.  Mouth/Throat: Oropharynx is clear and moist.  Eyes: Conjunctivae are normal. Pupils are equal, round, and reactive to light. Right eye exhibits no discharge. Left eye exhibits no discharge.  Neck: Normal  range of motion. Neck supple. No JVD present. No tracheal deviation present. No thyromegaly present.  Cardiovascular: Normal rate, regular rhythm and normal heart sounds.  Pulmonary/Chest: No stridor. No respiratory distress. She has no wheezes.  Abdominal: Soft. Bowel sounds are normal. She exhibits no distension and no mass. There is no tenderness. There is no rebound and no guarding.  Musculoskeletal: She exhibits tenderness. She exhibits no edema.  Lymphadenopathy:    She has no cervical  adenopathy.  Neurological: She displays normal reflexes. No cranial nerve deficit. She exhibits normal muscle tone. Coordination normal.  Skin: No rash noted. No erythema.  Psychiatric: She has a normal mood and affect. Her behavior is normal. Judgment and thought content normal.  obese LS is tender Mild intertrigo  Lab Results  Component Value Date   WBC 7.8 06/30/2016   HGB 13.9 06/30/2016   HCT 40.9 06/30/2016   PLT 221.0 06/30/2016   GLUCOSE 127 (H) 01/26/2017   CHOL 148 06/30/2016   TRIG 185.0 (H) 06/30/2016   HDL 47.30 06/30/2016   LDLCALC 63 06/30/2016   ALT 16 06/30/2016   AST 18 06/30/2016   NA 141 01/26/2017   K 3.9 01/26/2017   CL 106 01/26/2017   CREATININE 0.66 01/26/2017   BUN 19 01/26/2017   CO2 27 01/26/2017   TSH 0.84 01/26/2017   HGBA1C 6.3 01/26/2017    Mr Lumbar Spine Wo Contrast  Result Date: 02/03/2017 CLINICAL DATA:  Low back pain with left leg pain EXAM: MRI LUMBAR SPINE WITHOUT CONTRAST TECHNIQUE: Multiplanar, multisequence MR imaging of the lumbar spine was performed. No intravenous contrast was administered. COMPARISON:  Lumbar MRI 04/01/2013 FINDINGS: Segmentation:  Normal Alignment: Mild retrolisthesis L1-2. 4 mm retrolisthesis L2-3. 3 mm anterolisthesis L3-4. Vertebrae: Negative for fracture or mass. Discogenic changes on the right at L4-5 have progressed Conus medullaris: Extends to the L1-2 level and appears normal. Incidental note is made of fatty infiltration of the filum terminale. Paraspinal and other soft tissues: Negative Disc levels: T12-L1:  Mild disc degeneration L1-2:  Mild disc degeneration without stenosis L2-3: Disc degeneration with diffuse disc bulging and mild spinal stenosis. Progression of stenosis and degenerative change since the prior MRI. Subarticular narrowing bilaterally. L3-4: Mild anterolisthesis. Diffuse disc bulging and moderate facet degeneration. Mild-to-moderate spinal stenosis with progression since the prior study.  L4-5: Progression of disc degeneration. Progressive disc space narrowing right greater than left with associated discogenic sclerosis and edema. Diffuse disc bulging and spurring. Broad-based left-sided disc protrusion has progressed with subarticular stenosis and compression of the left L5 nerve root. Mild left foraminal narrowing. Mild right foraminal narrowing. Mild spinal stenosis. L5-S1: Progression of disc degeneration with diffuse spurring. Moderate left foraminal narrowing, unchanged. 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 Broad-based left-sided disc protrusion with compression of left L5 nerve root. Mild foraminal narrowing bilaterally and mild spinal stenosis Moderate left foraminal narrowing   L5-S1 unchanged. Electronically Signed   By: Franchot Gallo M.D.   On: 02/03/2017 08:54    Assessment & Plan:   There are no diagnoses linked to this encounter. I have discontinued [place]. Roosevelt's benzonatate and azithromycin. I am also having her maintain her Vitamin D3, umeclidinium-vilanterol, meclizine, metoprolol succinate, ketoconazole, fluconazole, gabapentin, predniSONE, HYDROcodone-acetaminophen, and ALPRAZolam.  No orders of the defined types were placed in this encounter.    Follow-up: No Follow-up on file.  Walker Kehr, MD

## 2017-09-17 NOTE — Assessment & Plan Note (Signed)
Labs

## 2017-09-17 NOTE — Assessment & Plan Note (Signed)
Ketoconazole cream

## 2017-09-17 NOTE — Patient Instructions (Signed)
  [name] , Thank you for taking time to come for your Medicare Wellness Visit. I appreciate your ongoing commitment to your health goals. Please review the following plan we discussed and let me know if I can assist you in the future.   These are the goals we discussed: Goals    . patient (pt-stated)     Book; "saying goodnight to insomnia"  Or other sleep type workbook     . Weight < 180 lb (81.647 kg)     Plan; watches the cooking shows and weight loss programs; popcorn Foods at hs; baked kale at hs; bowl of cereal; discussed walking during commercials;  100 calories /individual serving        This is a list of the screening recommended for you and due dates:  Health Maintenance  Topic Date Due  . Urine Protein Check  04/25/1958  . Tetanus Vaccine  04/26/1967  . Pneumonia vaccines (2 of 2 - PPSV23) 06/27/2017  . Hemoglobin A1C  07/29/2017  . Complete foot exam   09/26/2017*  .  Hepatitis C: One time screening is recommended by Center for Disease Control  (CDC) for  adults born from [birth year] through 1963.   09/26/2017*  . Flu Shot  10/11/2017*  . Eye exam for diabetics  04/10/2018  . Mammogram  08/22/2018  . Colon Cancer Screening  08/29/2019  . DEXA scan (bone density measurement)  Completed  *Topic was postponed. The date shown is not the original due date.

## 2017-09-17 NOTE — Assessment & Plan Note (Signed)
Toprol XL 

## 2017-09-17 NOTE — Assessment & Plan Note (Addendum)
Here for medicare wellness/physical  Diet: heart healthy  Physical activity: not sedentary  Depression/mood screen: negative  Hearing: intact to whispered voice  Visual acuity: grossly normal, performs annual eye exam  ADLs: capable  Fall risk: low to none  Home safety: good  Cognitive evaluation: intact to orientation, naming, recall and repetition  EOL planning: adv directives, full code/ I agree  I have personally reviewed and have noted  1. The patient's medical, surgical and social history  2. Their use of alcohol, tobacco or illicit drugs  3. Their current medications and supplements  4. The patient's functional ability including ADL's, fall risks, home safety risks and hearing or visual impairment.  5. Diet and physical activities  6. Evidence for depression or mood disorders 7. The roster of all physicians providing medical care to patient - is listed in the Snapshot section of the chart and reviewed today.    Today patient counseled on age appropriate routine health concerns for screening and prevention, each reviewed and up to date or declined. Immunizations reviewed and up to date or declined. Labs ordered and reviewed. Risk factors for depression reviewed and negative. Hearing function and visual acuity are intact. ADLs screened and addressed as needed. Functional ability and level of safety reviewed and appropriate. Education, counseling and referrals performed based on assessed risks today. Patient provided with a copy of personalized plan for preventive services.   Refused tDAP   DEXA

## 2017-09-18 ENCOUNTER — Ambulatory Visit
Admission: RE | Admit: 2017-09-18 | Discharge: 2017-09-18 | Disposition: A | Discharge status: Home / Self Care | Payer: Medicare Other | Source: Ambulatory Visit | Attending: Internal Medicine | Admitting: Internal Medicine

## 2017-09-18 DIAGNOSIS — Z1231 Encounter for screening mammogram for malignant neoplasm of breast: Secondary | ICD-10-CM

## 2017-09-18 LAB — PMP SCREEN PROFILE (10S), URINE
Amphetamine Scrn, Ur: NEGATIVE ng/mL
BARBITURATE SCREEN URINE: NEGATIVE ng/mL
BENZODIAZEPINE SCREEN, URINE: NEGATIVE ng/mL
CANNABINOIDS UR QL SCN: NEGATIVE ng/mL
Cocaine (Metab) Scrn, Ur: NEGATIVE ng/mL
Creatinine(Crt), U: 171.9 mg/dL (ref 20.0–300.0)
Methadone Screen, Urine: NEGATIVE ng/mL
OXYCODONE+OXYMORPHONE UR QL SCN: NEGATIVE ng/mL
Opiate Scrn, Ur: POSITIVE ng/mL — AB
Ph of Urine: 5.2 (ref 4.5–8.9)
Phencyclidine Qn, Ur: NEGATIVE ng/mL
Propoxyphene Scrn, Ur: NEGATIVE ng/mL

## 2017-12-21 ENCOUNTER — Encounter: Payer: Self-pay | Admitting: Internal Medicine

## 2017-12-21 ENCOUNTER — Ambulatory Visit (INDEPENDENT_AMBULATORY_CARE_PROVIDER_SITE_OTHER): Payer: Medicare Other | Admitting: Internal Medicine

## 2017-12-21 DIAGNOSIS — G2581 Restless legs syndrome: Secondary | ICD-10-CM | POA: Diagnosis not present

## 2017-12-21 DIAGNOSIS — F329 Major depressive disorder, single episode, unspecified: Secondary | ICD-10-CM

## 2017-12-21 DIAGNOSIS — G8929 Other chronic pain: Secondary | ICD-10-CM

## 2017-12-21 DIAGNOSIS — I10 Essential (primary) hypertension: Secondary | ICD-10-CM

## 2017-12-21 DIAGNOSIS — M5440 Lumbago with sciatica, unspecified side: Secondary | ICD-10-CM

## 2017-12-21 DIAGNOSIS — E119 Type 2 diabetes mellitus without complications: Secondary | ICD-10-CM | POA: Diagnosis not present

## 2017-12-21 DIAGNOSIS — F32A Depression, unspecified: Secondary | ICD-10-CM

## 2017-12-21 DIAGNOSIS — R1031 Right lower quadrant pain: Secondary | ICD-10-CM | POA: Diagnosis not present

## 2017-12-21 DIAGNOSIS — G4733 Obstructive sleep apnea (adult) (pediatric): Secondary | ICD-10-CM | POA: Diagnosis not present

## 2017-12-21 DIAGNOSIS — F419 Anxiety disorder, unspecified: Secondary | ICD-10-CM | POA: Diagnosis not present

## 2017-12-21 DIAGNOSIS — G4700 Insomnia, unspecified: Secondary | ICD-10-CM

## 2017-12-21 MED ORDER — HYDROCODONE-ACETAMINOPHEN 7.5-325 MG PO TABS: 1.0000 | ORAL_TABLET | Freq: Three times a day (TID) | ORAL | 0 refills | Status: DC | PRN

## 2017-12-21 MED ORDER — ALPRAZOLAM 0.5 MG PO TABS: 0.5000 mg | ORAL_TABLET | Freq: Two times a day (BID) | ORAL | 2 refills | Status: DC | PRN

## 2017-12-21 MED ORDER — PRAMIPEXOLE DIHYDROCHLORIDE 0.125 MG PO TABS: 0.1250 mg | ORAL_TABLET | Freq: Every day | ORAL | 5 refills | Status: DC

## 2017-12-21 NOTE — Assessment & Plan Note (Signed)
Labs

## 2017-12-21 NOTE — Assessment & Plan Note (Signed)
[employer]

## 2017-12-21 NOTE — Patient Instructions (Signed)
[facility] well w/Amanda Arnold 

## 2017-12-21 NOTE — Assessment & Plan Note (Signed)
Xanax prn  Potential benefits of a long term benzodiazepines  use as well as potential risks  and complications were explained to the patient and were aknowledged. 

## 2017-12-21 NOTE — Assessment & Plan Note (Signed)
OSA - unable to use CPAP

## 2017-12-21 NOTE — Assessment & Plan Note (Signed)
Toprol XL 

## 2017-12-21 NOTE — Assessment & Plan Note (Signed)
Norco prn  Potential benefits of a long term opioids use as well as potential risks (i.e. addiction risk, apnea etc) and complications (i.e. Somnolence, constipation and others) were explained to the patient and were aknowledged. 

## 2017-12-21 NOTE — Assessment & Plan Note (Addendum)
[employer] - will re-start OSA - unable to use CPAP

## 2017-12-21 NOTE — Progress Notes (Signed)
Subjective:  Patient ID: Amanda Arnold, female    DOB: 04-26-1946  Age: 72 y.o. MRN: [id number]  CC: No chief complaint on file.   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 ALPRAZolam (XANAX) 0.5 MG tablet Take 1 tablet (0.5 mg total) by mouth 2 (two) times daily as needed for anxiety. 60 tablet 2  . Cholecalciferol (VITAMIN D3) 2000 UNITS capsule Take 1 capsule (2,000 Units total) by mouth daily. 100 capsule 3  . fluconazole (DIFLUCAN) 100 MG tablet Take 2 tabs on day#1, then 1 tab daily on Days #2-10 11 tablet 1  . HYDROcodone-acetaminophen (NORCO) 7.5-325 MG tablet Take 1 tablet by mouth every 8 (eight) hours as needed for severe pain. 90 tablet 0  . ketoconazole (NIZORAL) 2 % cream Apply 1 application topically 2 (two) times daily. In the groin 60 g 1  . meclizine (ANTIVERT) 12.5 MG tablet Take 1-2 tablets (12.5-25 mg total) by mouth 3 (three) times daily as needed for dizziness. 60 tablet 1  . metoprolol succinate (TOPROL-XL) 25 MG 24 hr tablet Take 0.5 tablets (12.5 mg total) by mouth at bedtime. 90 tablet 3  . predniSONE (DELTASONE) 20 MG tablet Take 2 tablets (40 mg total) by mouth daily with breakfast. 8 tablet 0  . umeclidinium-vilanterol (ANORO ELLIPTA) 62.5-25 MCG/INH AEPB Inhale 1 puff into the lungs daily. 1 each 11  . gabapentin (NEURONTIN) 100 MG capsule Take 3 capsules (300 mg total) by mouth 2 (two) times daily. (Patient not taking: Reported on 03/19/2017) 30 capsule 0   No facility-administered medications prior to visit.     ROS: Review of Systems  Constitutional: Positive for fatigue. Negative for activity change, appetite change, chills and unexpected weight change.  HENT: Negative for congestion, mouth sores and sinus pressure.   Eyes: Negative for visual disturbance.  Respiratory: Negative for cough and chest tightness.   Gastrointestinal: Negative for abdominal  pain and nausea.  Genitourinary: Negative for difficulty urinating, frequency and vaginal pain.  Musculoskeletal: Positive for back pain. Negative for gait problem.  Skin: Negative for pallor and rash.  Neurological: Negative for dizziness, tremors, weakness, numbness and headaches.  Psychiatric/Behavioral: Positive for dysphoric mood and sleep disturbance. Negative for confusion and suicidal ideas. The patient is nervous/anxious.     Objective:  BP 116/72 (BP Location: Left Arm, Patient Position: Sitting, Cuff Size: Large)   Pulse 61   Temp 98.3 F (36.8 C) (Oral)   Ht 5\' 2"  (1.575 m)   Wt 205 lb (93 kg)   SpO2 98%   BMI 37.49 kg/m   BP Readings from Last 3 Encounters:  12/21/17 116/72  09/17/17 118/78  06/18/17 122/76    Wt Readings from Last 3 Encounters:  12/21/17 205 lb (93 kg)  09/17/17 206 lb (93.4 kg)  06/18/17 204 lb (92.5 kg)    Physical Exam  Constitutional: She appears well-developed. No distress.  HENT:  Head: Normocephalic.  Right Ear: External ear normal.  Left Ear: External ear normal.  Nose: Nose normal.  Mouth/Throat: Oropharynx is clear and moist.  Eyes: Pupils are equal, round, and reactive to light. Conjunctivae are normal. Right eye exhibits no discharge. Left eye exhibits no discharge.  Neck: Normal range of motion. Neck supple. No JVD present. No tracheal deviation present. No thyromegaly present.  Cardiovascular: Normal rate, regular rhythm and normal heart sounds.  Pulmonary/Chest: No stridor. No respiratory distress. She  has no wheezes.  Abdominal: Soft. Bowel sounds are normal. She exhibits no distension and no mass. There is no tenderness. There is no rebound and no guarding.  Musculoskeletal: She exhibits tenderness. She exhibits no edema.  Lymphadenopathy:    She has no cervical adenopathy.  Neurological: She displays normal reflexes. No cranial nerve deficit. She exhibits normal muscle tone. Coordination normal.  Skin: No rash noted. No  erythema.  Psychiatric: She has a normal mood and affect. Her behavior is normal. Judgment and thought content normal.    Lab Results  Component Value Date   WBC 7.8 06/30/2016   HGB 13.9 06/30/2016   HCT 40.9 06/30/2016   PLT 221.0 06/30/2016   GLUCOSE 111 (H) 09/17/2017   CHOL 167 09/17/2017   TRIG 92.0 09/17/2017   HDL 53.00 09/17/2017   LDLCALC 96 09/17/2017   ALT 30 09/17/2017   AST 31 09/17/2017   NA 140 09/17/2017   K 4.2 09/17/2017   CL 105 09/17/2017   CREATININE 0.71 09/17/2017   BUN 13 09/17/2017   CO2 28 09/17/2017   TSH 1.50 09/17/2017   HGBA1C 6.3 09/17/2017    Mm Screening Breast Tomo Bilateral  Result Date: 09/21/2017 CLINICAL DATA:  Screening. EXAM: DIGITAL SCREENING BILATERAL MAMMOGRAM WITH TOMO AND CAD COMPARISON:  Previous exam(s). ACR Breast Density Category b: There are scattered areas of fibroglandular density. FINDINGS: There are no findings suspicious for malignancy. Images were processed with CAD. IMPRESSION: No mammographic evidence of malignancy. A result letter of this screening mammogram will be mailed directly to the patient. RECOMMENDATION: Screening mammogram in one year. (Code:SM-B-01Y) BI-RADS CATEGORY  1: Negative. Electronically Signed   By: Ammie Ferrier M.D.   On: 09/21/2017 11:06    Assessment & Plan:   There are no diagnoses linked to this encounter.   No orders of the defined types were placed in this encounter.    Follow-up: No follow-ups on file.  Walker Kehr, MD

## 2018-01-05 DIAGNOSIS — R350 Frequency of micturition: Secondary | ICD-10-CM | POA: Diagnosis not present

## 2018-02-08 ENCOUNTER — Emergency Department (HOSPITAL_COMMUNITY): Payer: Medicare Other

## 2018-02-08 ENCOUNTER — Other Ambulatory Visit: Payer: Self-pay

## 2018-02-08 ENCOUNTER — Emergency Department (HOSPITAL_COMMUNITY)
Admission: EM | Admit: 2018-02-08 | Discharge: 2018-02-08 | Disposition: A | Discharge status: Home / Self Care | Payer: Medicare Other | Attending: Emergency Medicine | Admitting: Emergency Medicine

## 2018-02-08 ENCOUNTER — Other Ambulatory Visit: Payer: Self-pay | Admitting: *Deleted

## 2018-02-08 ENCOUNTER — Encounter (HOSPITAL_COMMUNITY): Payer: Self-pay | Admitting: *Deleted

## 2018-02-08 DIAGNOSIS — I10 Essential (primary) hypertension: Secondary | ICD-10-CM | POA: Insufficient documentation

## 2018-02-08 DIAGNOSIS — R079 Chest pain, unspecified: Secondary | ICD-10-CM | POA: Diagnosis not present

## 2018-02-08 DIAGNOSIS — Z79899 Other long term (current) drug therapy: Secondary | ICD-10-CM | POA: Diagnosis not present

## 2018-02-08 DIAGNOSIS — J449 Chronic obstructive pulmonary disease, unspecified: Secondary | ICD-10-CM | POA: Insufficient documentation

## 2018-02-08 DIAGNOSIS — M436 Torticollis: Secondary | ICD-10-CM | POA: Insufficient documentation

## 2018-02-08 DIAGNOSIS — F17210 Nicotine dependence, cigarettes, uncomplicated: Secondary | ICD-10-CM | POA: Diagnosis not present

## 2018-02-08 DIAGNOSIS — M25511 Pain in right shoulder: Secondary | ICD-10-CM | POA: Diagnosis not present

## 2018-02-08 DIAGNOSIS — R0789 Other chest pain: Secondary | ICD-10-CM | POA: Diagnosis not present

## 2018-02-08 LAB — CBC
HCT: 44.3 % (ref 36.0–46.0)
Hemoglobin: 14.1 g/dL (ref 12.0–15.0)
MCH: 30.9 pg (ref 26.0–34.0)
MCHC: 31.8 g/dL (ref 30.0–36.0)
MCV: 96.9 fL (ref 78.0–100.0)
Platelets: 220 10*3/uL (ref 150–400)
RBC: 4.57 MIL/uL (ref 3.87–5.11)
RDW: 12.5 % (ref 11.5–15.5)
WBC: 8.5 10*3/uL (ref 4.0–10.5)

## 2018-02-08 LAB — BASIC METABOLIC PANEL
Anion gap: 10 (ref 5–15)
BUN: 15 mg/dL (ref 8–23)
CO2: 21 mmol/L — ABNORMAL LOW (ref 22–32)
Calcium: 9.2 mg/dL (ref 8.9–10.3)
Chloride: 107 mmol/L (ref 98–111)
Creatinine, Ser: 0.7 mg/dL (ref 0.44–1.00)
GFR calc Af Amer: >60 mL/min (ref >60)
GFR calc non Af Amer: >60 mL/min (ref >60)
Glucose, Bld: 128 mg/dL — ABNORMAL HIGH (ref 70–99)
Potassium: 4.3 mmol/L (ref 3.5–5.1)
Sodium: 138 mmol/L (ref 135–145)

## 2018-02-08 LAB — I-STAT TROPONIN, ED: Troponin i, poc: 0 ng/mL (ref 0.00–0.08)

## 2018-02-08 MED ORDER — METOPROLOL SUCCINATE ER 25 MG PO TB24: 12.5000 mg | ORAL_TABLET | Freq: Every day | ORAL | 3 refills | Status: DC

## 2018-02-08 MED ORDER — CYCLOBENZAPRINE HCL 10 MG PO TABS: 5.0000 mg | ORAL_TABLET | Freq: Three times a day (TID) | ORAL | 0 refills | Status: DC | PRN

## 2018-02-08 MED ORDER — LIDOCAINE 5 % EX PTCH
1.0000 | MEDICATED_PATCH | CUTANEOUS | Status: DC
  Administered 2018-02-08: 1 via TRANSDERMAL
  Filled 2018-02-08: qty 1

## 2018-02-08 NOTE — ED Notes (Signed)
Patient verbalizes understanding of discharge instructions. Opportunity for questioning and answers were provided. Armband removed by staff, pt discharged from ED.  

## 2018-02-08 NOTE — Discharge Instructions (Signed)
Compresses to sore muscles for 20 minutes at a time. Take muscle relaxer as needed as prescribed, do not drive or operate machinery while taking this medication. Gentle exercises, instructions given. Follow-up with your doctor for recheck, consider physical therapy if not improving.

## 2018-02-08 NOTE — ED Provider Notes (Signed)
[facility] EMERGENCY DEPARTMENT Provider Note   CSN: [id number] Arrival date & time: 02/08/18  1118     History   Chief Complaint Chief Complaint  Patient presents with  . Chest Pain  . Shoulder Pain    HPI Amanda Arnold is a 72 y.o. female.  72 year old female presents with complaint of pain in her right shoulder x4 days.  Patient states she noticed the pain on Thursday upon waking, pain is worse with movement of her head or neck, improves with changes in position.  Pain located from the right posterior neck along her shoulder blade area and into her right shoulder as well as her right upper chest.  Denies shortness of breath, nausea, vomiting, diaphoresis, falls or injuries.  Patient took her pain medication with limited relief, found that she had more relief when she took Motrin.  Patient called her PCP but was unable to be seen soon enough so she came to the emergency room for evaluation.  No other complaints or concerns.     Past Medical History:  Diagnosis Date  . Abdominal pain, epigastric 12/18/2009  . ANXIETY 05/25/2006  . BRONCHITIS, ACUTE 12/08/2007  . COPD 05/25/2006  . DEPRESSION 01/02/2006  . ESOTROPIA, LEFT EYE Sep 09, 1945  . FIBROMYALGIA 04/02/2006  . Headache(784.0) 12/08/2007  . HYPERGLYCEMIA 05/15/2010  . HYPERLIPIDEMIA 04/30/2006  . HYPERTENSION 05/25/2006  . Hypertension   . INSOMNIA, PERSISTENT 02/22/2008  . Lazy eye    left  . LOW BACK PAIN 01/27/2007  . Migraines   . 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 OBSTRUCTIVE SLEEP APNEA 04/17/2010   does not use CPAP  . OSTEOARTHRITIS 05/25/2006  . Polyuria   . Pre-diabetes   . Psychosomatic disease 2011  . 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 T M J 05/11/2009    Patient Active Problem List   Diagnosis Date Noted  . Peroneal mononeuropathy, left 01/26/2017  . Skin cyst 09/26/2016  . Adjustment disorder with mixed anxiety and depressed mood 06/27/2016  . Fatigue 03/28/2016  . Restless  leg syndrome 03/28/2016  . RLQ abdominal pain 08/15/2015  . Well adult exam 07/03/2015  . Meralgia paraesthetica 05/29/2015  . Paresthesia 05/15/2015  . Sciatic leg pain 09/22/2014  . Rash and nonspecific skin eruption 09/11/2014  . Right lower quadrant pain 09/11/2014  . Nonspecific abnormal electrocardiogram (ECG) (EKG) 07/27/2014  . Precordial pain 07/27/2014  . Cerumen impaction 06/16/2014  . Chest pain, atypical 03/03/2014  . UTI (urinary tract infection) 03/29/2013  . Herpes zoster 03/02/2013  . Abscess of left leg 02/02/2013  . Leg pain, bilateral 12/09/2012  . Left groin pain 09/09/2012  . Unspecified sinusitis (chronic) 08/25/2012  . Chest wall pain 05/20/2012  . Nocturia 12/24/2011  . Psychosomatic pain 10/14/2011  . Left shoulder pain 08/22/2011  . Vertigo 06/13/2011  . Syncopal vertigo 06/13/2011  . Intertriginous candidiasis 03/07/2011  . Diabetes type 2, controlled ([place]) 03/07/2011  . HIP PAIN 06/17/2010  . HYPERGLYCEMIA 05/15/2010  . CENTRAL SLEEP APNEA CONDS CLASSIFIED ELSEWHERE 05/14/2010  . Obstructive sleep apnea 04/17/2010  . Obesity 04/10/2010  . 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 DYSPNEA 03/15/2010  . TRIGGER FINGER 12/18/2009  . ABDOMINAL PAIN, EPIGASTRIC 12/18/2009  . KNEE PAIN 07/11/2009  . 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 COLD SORE 05/11/2009  . T M J 05/11/2009  . BREAST PAIN 04/13/2009  . Edema 03/01/2009  . CERVICAL LYMPHADENOPATHY 03/01/2009  . PRURITUS 07/28/2008  . TACHYCARDIA 07/28/2008  . INSOMNIA, PERSISTENT 02/22/2008  . Cystitis 02/22/2008  .  SWEATING 01/18/2008  . BRONCHITIS, ACUTE 12/08/2007  . FATIGUE 12/08/2007  . Headache(784.0) 12/08/2007  . COUGH 12/08/2007  . ARTHRALGIA 11/19/2007  . LOW BACK PAIN 01/27/2007  . Tobacco use disorder 11/28/2006  . Generalized anxiety disorder 05/25/2006  . Essential hypertension 05/25/2006  . COPD exacerbation ([place]) 05/25/2006  . OSTEOARTHRITIS 05/25/2006  . Polyuria 05/25/2006    . HYPERLIPIDEMIA 04/30/2006  . Myalgia and myositis 04/02/2006  . Anxiety and depression 01/02/2006  . Disturbance of skin sensation 05/29/2005  . ESOTROPIA, LEFT EYE [birth year]    Past Surgical History:  Procedure Laterality Date  . CHOLECYSTECTOMY    . MASS EXCISION Left 10/06/2016   Procedure: EXCISION LEFT BUTTOCKS MASS;  Surgeon: Alphonsa Overall, MD;  Location: [facility];  Service: General;  Laterality: Left;     OB History   None      Home Medications    Prior to Admission medications   Medication Sig Start Date End Date Taking? Authorizing Provider  ALPRAZolam Duanne Moron) 0.5 MG tablet Take 1 tablet (0.5 mg total) by mouth 2 (two) times daily as needed for anxiety. 12/21/17   Plotnikov, Evie Lacks, MD  Cholecalciferol (VITAMIN D3) 2000 UNITS capsule Take 1 capsule (2,000 Units total) by mouth daily. 08/22/14   Plotnikov, Evie Lacks, MD  cyclobenzaprine (FLEXERIL) 10 MG tablet Take 0.5 tablets (5 mg total) by mouth 3 (three) times daily as needed for muscle spasms. 02/08/18   Tacy Learn, PA-C  fluconazole (DIFLUCAN) 100 MG tablet Take 2 tabs on day#1, then 1 tab daily on Days #2-10 01/27/17   Plotnikov, Evie Lacks, MD  gabapentin (NEURONTIN) 100 MG capsule Take 3 capsules (300 mg total) by mouth 2 (two) times daily. Patient not taking: Reported on 03/19/2017 02/03/17 02/08/17  Kinnie Feil, PA-C  HYDROcodone-acetaminophen [facility]) 7.5-325 MG tablet Take 1 tablet by mouth every 8 (eight) hours as needed for severe pain. 12/21/17   Plotnikov, Evie Lacks, MD  ketoconazole (NIZORAL) 2 % cream Apply 1 application topically 2 (two) times daily. In the groin 09/17/17   Plotnikov, Evie Lacks, MD  meclizine (ANTIVERT) 12.5 MG tablet Take 1-2 tablets (12.5-25 mg total) by mouth 3 (three) times daily as needed for dizziness. 09/17/17   Plotnikov, Evie Lacks, MD  metoprolol succinate (TOPROL-XL) 25 MG 24 hr tablet Take 0.5 tablets (12.5 mg total) by mouth at bedtime. 02/08/18   Plotnikov,  Evie Lacks, MD  pramipexole (MIRAPEX) 0.125 MG tablet Take 1-2 tablets (0.125-0.25 mg total) by mouth at bedtime. 12/21/17   Plotnikov, Evie Lacks, MD  predniSONE (DELTASONE) 20 MG tablet Take 2 tablets (40 mg total) by mouth daily with breakfast. 06/02/17   Lucretia Kern, DO  umeclidinium-vilanterol (ANORO ELLIPTA) 62.5-25 MCG/INH AEPB Inhale 1 puff into the lungs daily. 11/09/15   Plotnikov, Evie Lacks, MD    Family History Family History  Problem Relation Age of Onset  . Heart attack Father   . Hypertension Brother   . Heart disease Mother   . Colon cancer Neg Hx   . Stomach cancer Neg Hx     Social History Social History   Tobacco Use  . Smoking status: Current Some Day Smoker    Types: Cigarettes    Last attempt to quit: 07/10/2013    Years since quitting: 4.5  . Smokeless tobacco: Current User    Types: Snuff  . Tobacco comment: smokes 2-4 cigs, maybe more when anxious  Substance Use Topics  . Alcohol use: No  Alcohol/week: 0.0 oz  . Drug use: No     Allergies   Morphine and related; Bupropion hcl; Duloxetine; Ezetimibe; Metformin and related; Trazodone hcl; and Tramadol hcl   Review of Systems Review of Systems  Constitutional: Negative for diaphoresis and fever.  Respiratory: Negative for chest tightness and shortness of breath.   Cardiovascular: Negative for chest pain.  Gastrointestinal: Negative for nausea and vomiting.  Musculoskeletal: Positive for arthralgias, back pain, myalgias and neck pain. Negative for gait problem and joint swelling.  Skin: Negative for rash and wound.  Neurological: Negative for dizziness, weakness, numbness and headaches.  Hematological: Negative for adenopathy.  Psychiatric/Behavioral: Negative for confusion.  All other systems reviewed and are negative.    Physical Exam Updated Vital Signs BP (!) 144/81   Pulse 68   Temp 98.1 F (36.7 C) (Oral)   Resp 18   SpO2 98%   Physical Exam  Constitutional: She is oriented to  person, place, and time. She appears well-developed and well-nourished.  Non-toxic appearance. She does not appear ill. No distress.  HENT:  Head: Normocephalic and atraumatic.  Cardiovascular: Normal rate, regular rhythm, intact distal pulses and normal pulses.  Pulmonary/Chest: Effort normal and breath sounds normal.  Musculoskeletal:       Right shoulder: She exhibits decreased range of motion. She exhibits no bony tenderness, no swelling, no effusion, no crepitus and normal pulse.       Cervical back: She exhibits tenderness and pain. She exhibits no bony tenderness, no swelling and no edema.       Back:       Arms: Lymphadenopathy:    She has no cervical adenopathy.  Neurological: She is alert and oriented to person, place, and time. She is not disoriented. No cranial nerve deficit.  Skin: Skin is warm and dry. No rash noted.  Psychiatric: She has a normal mood and affect. Her behavior is normal.  Nursing note and vitals reviewed.    ED Treatments / Results  Labs (all labs ordered are listed, but only abnormal results are displayed) Labs Reviewed  BASIC METABOLIC PANEL - Abnormal; Notable for the following components:      Result Value   CO2 21 (*)    Glucose, Bld 128 (*)    All other components within normal limits  CBC  I-STAT TROPONIN, ED    EKG None  Radiology Dg Chest 2 View  Result Date: 02/08/2018 CLINICAL DATA:  Posterior RIGHT sharp chest pain radiating into back and RIGHT shoulder since Friday, pain worse with movement. EXAM: CHEST - 2 VIEW COMPARISON:  Chest x-ray dated 07/25/2014. FINDINGS: Mild cardiomegaly is stable. Atherosclerotic changes noted at the aortic arch. Lungs are clear. No pleural effusion or pneumothorax seen. Degenerative spondylosis of the thoracic spine, mild to moderate in degree. No acute or suspicious osseous finding. IMPRESSION: 1. No acute findings.  No evidence of pneumonia or pulmonary edema. 2. Mild cardiomegaly. 3. Aortic  atherosclerosis. Electronically Signed   By: Franki Cabot M.D.   On: 02/08/2018 11:58   Dg Shoulder Right  Result Date: 02/08/2018 CLINICAL DATA:  RIGHT shoulder pain since Friday. EXAM: RIGHT SHOULDER - 2+ VIEW COMPARISON:  None. FINDINGS: There is no evidence of fracture or dislocation. There is no evidence of arthropathy or other focal bone abnormality. Soft tissues are unremarkable. IMPRESSION: Negative. Electronically Signed   By: Franki Cabot M.D.   On: 02/08/2018 11:59    Procedures Procedures (including critical care time)  Medications Ordered in ED Medications  lidocaine (LIDODERM) 5 % 1 patch (has no administration in time range)     Initial Impression / Assessment and Plan / ED Course  I have reviewed the triage vital signs and the nursing notes.  Pertinent labs & imaging results that were available during my care of the patient were reviewed by me and considered in my medical decision making (see chart for details).  Clinical Course as of Feb 09 1351  Mon Feb 08, 9041  8152 72 year old female presents with complaint of right shoulder pain onset 4 days ago upon waking without injury.  Pain is worse with movement, improves with position changes and ibuprofen.  X-ray of the chest and shoulder are unremarkable, lab work-up including a CBC, BMP, troponin are all normal.  History and exam do not present as a cardiac problem more so musculoskeletal.  Patient has tenderness through her right trapezius area as well as right pectoral area.  Patient will be given a Lidoderm patch on the emergency room, discharged home with prescription for 5 mg Flexeril.  Recommend she recheck with her PCP, she can continue with her Motrin and her pain medication as previously prescribed.   [LM]    Clinical Course User Index [LM] Tacy Learn, PA-C     Final Clinical Impressions(s) / ED Diagnoses   Final diagnoses:  Torticollis, acute    ED Discharge Orders        Ordered     cyclobenzaprine (FLEXERIL) 10 MG tablet  3 times daily PRN     02/08/18 1346       Tacy Learn, PA-C 02/08/18 1352    Maudie Flakes, MD 02/08/18 2351

## 2018-02-08 NOTE — ED Triage Notes (Signed)
Pt in c/o right chest pain that radiates into her back and right shoulder since Friday, pain is worse with movement, pt states pain was there when she woke up on Friday, denies injury

## 2018-02-09 ENCOUNTER — Ambulatory Visit: Payer: Medicare Other | Admitting: Internal Medicine

## 2018-02-11 ENCOUNTER — Encounter: Payer: Self-pay | Admitting: Internal Medicine

## 2018-02-11 ENCOUNTER — Ambulatory Visit (INDEPENDENT_AMBULATORY_CARE_PROVIDER_SITE_OTHER): Payer: Medicare Other | Admitting: Internal Medicine

## 2018-02-11 DIAGNOSIS — M542 Cervicalgia: Secondary | ICD-10-CM | POA: Diagnosis not present

## 2018-02-11 DIAGNOSIS — M791 Myalgia, unspecified site: Secondary | ICD-10-CM | POA: Diagnosis not present

## 2018-02-11 MED ORDER — HYDROCODONE-ACETAMINOPHEN 7.5-325 MG PO TABS: 1.0000 | ORAL_TABLET | Freq: Three times a day (TID) | ORAL | 0 refills | Status: DC | PRN

## 2018-02-11 MED ORDER — CYCLOBENZAPRINE HCL 5 MG PO TABS: 5.0000 mg | ORAL_TABLET | Freq: Two times a day (BID) | ORAL | 1 refills | Status: DC | PRN

## 2018-02-11 NOTE — Assessment & Plan Note (Signed)
Chronic FMS/OA/LBP  Potential benefits of a long term opioids use as well as potential risks (i.e. addiction risk, apnea etc) and complications (i.e. Somnolence, constipation and others) were explained to the patient and were aknowledged. On Norco prn Trigger point inj MRI cerv spine if not better

## 2018-02-11 NOTE — Progress Notes (Signed)
Subjective:  Patient ID: Amanda Arnold, female    DOB: Nov 21, 1945  Age: 72 y.o. MRN: [id number]  CC: No chief complaint on file.   HPI Amanda Arnold presents for severe R neck, shoulder blade and R arm pain x 2 weeks. It is a little better. Went to ER on 7/29 - given flexeril. F/u LBP, myalgias  Outpatient Medications Prior to Visit  Medication Sig Dispense Refill  . ALPRAZolam (XANAX) 0.5 MG tablet Take 1 tablet (0.5 mg total) by mouth 2 (two) times daily as needed for anxiety. 60 tablet 2  . Cholecalciferol (VITAMIN D3) 2000 UNITS capsule Take 1 capsule (2,000 Units total) by mouth daily. 100 capsule 3  . cyclobenzaprine (FLEXERIL) 10 MG tablet Take 0.5 tablets (5 mg total) by mouth 3 (three) times daily as needed for muscle spasms. 6 tablet 0  . fluconazole (DIFLUCAN) 100 MG tablet Take 2 tabs on day#1, then 1 tab daily on Days #2-10 11 tablet 1  . HYDROcodone-acetaminophen (NORCO) 7.5-325 MG tablet Take 1 tablet by mouth every 8 (eight) hours as needed for severe pain. 90 tablet 0  . ketoconazole (NIZORAL) 2 % cream Apply 1 application topically 2 (two) times daily. In the groin 60 g 1  . meclizine (ANTIVERT) 12.5 MG tablet Take 1-2 tablets (12.5-25 mg total) by mouth 3 (three) times daily as needed for dizziness. 60 tablet 1  . metoprolol succinate (TOPROL-XL) 25 MG 24 hr tablet Take 0.5 tablets (12.5 mg total) by mouth at bedtime. 45 tablet 3  . pramipexole (MIRAPEX) 0.125 MG tablet Take 1-2 tablets (0.125-0.25 mg total) by mouth at bedtime. 60 tablet 5  . predniSONE (DELTASONE) 20 MG tablet Take 2 tablets (40 mg total) by mouth daily with breakfast. 8 tablet 0  . umeclidinium-vilanterol (ANORO ELLIPTA) 62.5-25 MCG/INH AEPB Inhale 1 puff into the lungs daily. 1 each 11  . gabapentin (NEURONTIN) 100 MG capsule Take 3 capsules (300 mg total) by mouth 2 (two) times daily. (Patient not taking: Reported on 03/19/2017) 30 capsule 0   No facility-administered medications prior to visit.       ROS: Review of Systems  Constitutional: Negative for activity change, appetite change, chills, fatigue and unexpected weight change.  HENT: Negative for congestion, mouth sores and sinus pressure.   Eyes: Negative for visual disturbance.  Respiratory: Negative for cough, chest tightness, shortness of breath and wheezing.   Cardiovascular: Negative for leg swelling.  Gastrointestinal: Negative for abdominal pain and nausea.  Genitourinary: Negative for difficulty urinating, frequency and vaginal pain.  Musculoskeletal: Positive for back pain, myalgias and neck pain. Negative for gait problem.  Skin: Negative for pallor and rash.  Neurological: Negative for dizziness, tremors, weakness, numbness and headaches.  Psychiatric/Behavioral: Negative for confusion, sleep disturbance and suicidal ideas. The patient is nervous/anxious.     Objective:  BP 138/80 (BP Location: Left Arm, Patient Position: Sitting, Cuff Size: Normal)   Pulse 76   Temp 98 F (36.7 C) (Oral)   Ht 5\' 2"  (1.575 m)   Wt 204 lb (92.5 kg)   SpO2 96%   BMI 37.31 kg/m   BP Readings from Last 3 Encounters:  02/11/18 138/80  02/08/18 (!) 156/60  12/21/17 116/72    Wt Readings from Last 3 Encounters:  02/11/18 204 lb (92.5 kg)  02/08/18 208 lb (94.3 kg)  12/21/17 205 lb (93 kg)    Physical Exam  Constitutional: She appears well-developed. No distress.  HENT:  Head: Normocephalic.  Right Ear: External  ear normal.  Left Ear: External ear normal.  Nose: Nose normal.  Mouth/Throat: Oropharynx is clear and moist.  Eyes: Pupils are equal, round, and reactive to light. Conjunctivae are normal. Right eye exhibits no discharge. Left eye exhibits no discharge.  Neck: Normal range of motion. Neck supple. No JVD present. No tracheal deviation present. No thyromegaly present.  Cardiovascular: Normal rate, regular rhythm and normal heart sounds.  Pulmonary/Chest: No stridor. No respiratory distress. She has no wheezes.   Abdominal: Soft. Bowel sounds are normal. She exhibits no distension and no mass. There is no tenderness. There is no rebound and no guarding.  Musculoskeletal: She exhibits tenderness. She exhibits no edema.  Lymphadenopathy:    She has no cervical adenopathy.  Neurological: She displays normal reflexes. No cranial nerve deficit. She exhibits normal muscle tone. Coordination normal.  Skin: No rash noted. No erythema.  Psychiatric: She has a normal mood and affect. Her behavior is normal. Judgment and thought content normal.  several spots on R chest, trap, deltoid - tender   Procedure Note :    Trigger Point Injection:   Indication : Focal tender area identifiable by the location without other identifiable neurologic or musculoskeletal finding or pathology.   Risks including unsuccessful procedure , bleeding, infection, bruising, skin atrophy and others were explained to the patient in detail as well as the benefits. Informed consent was obtained and signed.   Tthe patient was placed in a comfortable position.  4  points of maximum tenderness over paraspinal and trapezius muscles were marked and  the skin was prepped with Betadine and alcohol. 1 inch 25-gauge needle was used. The needle was advanced perpendicular to the skin. Each trigger point was injected with 2 mL of 2% lidocaine and 20 mg of Depo-Medrol in a usual fashion.  Band-Aids applied.   Tolerated well. Complications: None. Good pain relief following the procedure.   Lab Results  Component Value Date   WBC 8.5 02/08/2018   HGB 14.1 02/08/2018   HCT 44.3 02/08/2018   PLT 220 02/08/2018   GLUCOSE 128 (H) 02/08/2018   CHOL 167 09/17/2017   TRIG 92.0 09/17/2017   HDL 53.00 09/17/2017   LDLCALC 96 09/17/2017   ALT 30 09/17/2017   AST 31 09/17/2017   NA 138 02/08/2018   K 4.3 02/08/2018   CL 107 02/08/2018   CREATININE 0.70 02/08/2018   BUN 15 02/08/2018   CO2 21 (L) 02/08/2018   TSH 1.50 09/17/2017   HGBA1C 6.3  09/17/2017    Dg Chest 2 View  Result Date: 02/08/2018 CLINICAL DATA:  Posterior RIGHT sharp chest pain radiating into back and RIGHT shoulder since Friday, pain worse with movement. EXAM: CHEST - 2 VIEW COMPARISON:  Chest x-ray dated 07/25/2014. FINDINGS: Mild cardiomegaly is stable. Atherosclerotic changes noted at the aortic arch. Lungs are clear. No pleural effusion or pneumothorax seen. Degenerative spondylosis of the thoracic spine, mild to moderate in degree. No acute or suspicious osseous finding. IMPRESSION: 1. No acute findings.  No evidence of pneumonia or pulmonary edema. 2. Mild cardiomegaly. 3. Aortic atherosclerosis. Electronically Signed   By: Franki Cabot M.D.   On: 02/08/2018 11:58   Dg Shoulder Right  Result Date: 02/08/2018 CLINICAL DATA:  RIGHT shoulder pain since Friday. EXAM: RIGHT SHOULDER - 2+ VIEW COMPARISON:  None. FINDINGS: There is no evidence of fracture or dislocation. There is no evidence of arthropathy or other focal bone abnormality. Soft tissues are unremarkable. IMPRESSION: Negative. Electronically Signed   By:  Franki Cabot M.D.   On: 02/08/2018 11:59    Assessment & Plan:   There are no diagnoses linked to this encounter.   No orders of the defined types were placed in this encounter.    Follow-up: No follow-ups on file.  Walker Kehr, MD

## 2018-02-11 NOTE — Assessment & Plan Note (Signed)
Chronic FMS/OA/LBP  Potential benefits of a long term opioids use as well as potential risks (i.e. addiction risk, apnea etc) and complications (i.e. Somnolence, constipation and others) were explained to the patient and were aknowledged. On Norco prn Trigger point inj MRI cerv spine if not better Flexeril

## 2018-02-18 DIAGNOSIS — R21 Rash and other nonspecific skin eruption: Secondary | ICD-10-CM | POA: Diagnosis not present

## 2018-03-11 DIAGNOSIS — B372 Candidiasis of skin and nail: Secondary | ICD-10-CM | POA: Diagnosis not present

## 2018-03-11 DIAGNOSIS — L0232 Furuncle of buttock: Secondary | ICD-10-CM | POA: Diagnosis not present

## 2018-03-23 ENCOUNTER — Ambulatory Visit (INDEPENDENT_AMBULATORY_CARE_PROVIDER_SITE_OTHER): Payer: Medicare Other | Admitting: Internal Medicine

## 2018-03-23 ENCOUNTER — Encounter: Payer: Self-pay | Admitting: Internal Medicine

## 2018-03-23 DIAGNOSIS — M5430 Sciatica, unspecified side: Secondary | ICD-10-CM | POA: Diagnosis not present

## 2018-03-23 DIAGNOSIS — E119 Type 2 diabetes mellitus without complications: Secondary | ICD-10-CM

## 2018-03-23 DIAGNOSIS — I10 Essential (primary) hypertension: Secondary | ICD-10-CM

## 2018-03-23 DIAGNOSIS — M7062 Trochanteric bursitis, left hip: Secondary | ICD-10-CM

## 2018-03-23 DIAGNOSIS — M7061 Trochanteric bursitis, right hip: Secondary | ICD-10-CM

## 2018-03-23 DIAGNOSIS — M791 Myalgia, unspecified site: Secondary | ICD-10-CM

## 2018-03-23 DIAGNOSIS — Z6836 Body mass index (BMI) 36.0-36.9, adult: Secondary | ICD-10-CM

## 2018-03-23 DIAGNOSIS — R5382 Chronic fatigue, unspecified: Secondary | ICD-10-CM

## 2018-03-23 MED ORDER — HYDROCODONE-ACETAMINOPHEN 7.5-325 MG PO TABS: 1.0000 | ORAL_TABLET | Freq: Three times a day (TID) | ORAL | 0 refills | Status: DC | PRN

## 2018-03-23 NOTE — Patient Instructions (Signed)
Ice, heat, massage to the hips

## 2018-03-23 NOTE — Assessment & Plan Note (Signed)
Wt Readings from Last 3 Encounters:  03/23/18 200 lb (90.7 kg)  02/11/18 204 lb (92.5 kg)  02/08/18 208 lb (94.3 kg)

## 2018-03-23 NOTE — Assessment & Plan Note (Signed)
Stretch  

## 2018-03-23 NOTE — Assessment & Plan Note (Signed)
Chronic pain Norco prn  Potential benefits of a long term opioids use as well as potential risks (i.e. addiction risk, apnea etc) and complications (i.e. Somnolence, constipation and others) were explained to the patient and were aknowledged.

## 2018-03-23 NOTE — Progress Notes (Signed)
Subjective:  Patient ID: Amanda Arnold, female    DOB: 13-May-1946  Age: 72 y.o. MRN: [id number]  CC: No chief complaint on file.   HPI Amanda Arnold presents for FMS, fatigue, anxiety f/u C/o B hips pain worse w/laying on the side. C/o LBP The pt saw GYN - took Diflucan for yeast po  Outpatient Medications Prior to Visit  Medication Sig Dispense Refill  . ALPRAZolam (XANAX) 0.5 MG tablet Take 1 tablet (0.5 mg total) by mouth 2 (two) times daily as needed for anxiety. 60 tablet 2  . Cholecalciferol (VITAMIN D3) 2000 UNITS capsule Take 1 capsule (2,000 Units total) by mouth daily. 100 capsule 3  . cyclobenzaprine (FLEXERIL) 5 MG tablet Take 1 tablet (5 mg total) by mouth 2 (two) times daily as needed for muscle spasms. 30 tablet 1  . fluconazole (DIFLUCAN) 100 MG tablet Take 2 tabs on day#1, then 1 tab daily on Days #2-10 11 tablet 1  . HYDROcodone-acetaminophen (NORCO) 7.5-325 MG tablet Take 1 tablet by mouth every 8 (eight) hours as needed for severe pain. 90 tablet 0  . ketoconazole (NIZORAL) 2 % cream Apply 1 application topically 2 (two) times daily. In the groin 60 g 1  . meclizine (ANTIVERT) 12.5 MG tablet Take 1-2 tablets (12.5-25 mg total) by mouth 3 (three) times daily as needed for dizziness. 60 tablet 1  . metoprolol succinate (TOPROL-XL) 25 MG 24 hr tablet Take 0.5 tablets (12.5 mg total) by mouth at bedtime. 45 tablet 3  . pramipexole (MIRAPEX) 0.125 MG tablet Take 1-2 tablets (0.125-0.25 mg total) by mouth at bedtime. 60 tablet 5  . umeclidinium-vilanterol (ANORO ELLIPTA) 62.5-25 MCG/INH AEPB Inhale 1 puff into the lungs daily. 1 each 11   No facility-administered medications prior to visit.     ROS: Review of Systems  Constitutional: Positive for fatigue. Negative for activity change, appetite change, chills and unexpected weight change.  HENT: Negative for congestion, mouth sores and sinus pressure.   Eyes: Negative for visual disturbance.  Respiratory: Negative  for cough and chest tightness.   Gastrointestinal: Negative for abdominal pain and nausea.  Genitourinary: Negative for difficulty urinating, frequency and vaginal pain.  Musculoskeletal: Positive for arthralgias, back pain, gait problem, neck pain and neck stiffness.  Skin: Negative for pallor and rash.  Neurological: Negative for dizziness, tremors, weakness, numbness and headaches.  Psychiatric/Behavioral: Positive for sleep disturbance. Negative for confusion and suicidal ideas. The patient is nervous/anxious.     Objective:  BP 138/80 (BP Location: Left Arm, Patient Position: Sitting, Cuff Size: Large)   Pulse 77   Temp 98.5 F (36.9 C) (Oral)   Ht 5\' 2"  (1.575 m)   Wt 200 lb (90.7 kg)   SpO2 97%   BMI 36.58 kg/m   BP Readings from Last 3 Encounters:  03/23/18 138/80  02/11/18 138/80  02/08/18 (!) 156/60    Wt Readings from Last 3 Encounters:  03/23/18 200 lb (90.7 kg)  02/11/18 204 lb (92.5 kg)  02/08/18 208 lb (94.3 kg)    Physical Exam  Constitutional: She appears well-developed. No distress.  HENT:  Head: Normocephalic.  Right Ear: External ear normal.  Left Ear: External ear normal.  Nose: Nose normal.  Mouth/Throat: Oropharynx is clear and moist.  Eyes: Pupils are equal, round, and reactive to light. Conjunctivae are normal. Right eye exhibits no discharge. Left eye exhibits no discharge.  Neck: Normal range of motion. Neck supple. No JVD present. No tracheal deviation present. No thyromegaly  present.  Cardiovascular: Normal rate, regular rhythm and normal heart sounds.  Pulmonary/Chest: No stridor. No respiratory distress. She has no wheezes.  Abdominal: Soft. Bowel sounds are normal. She exhibits no distension and no mass. There is no tenderness. There is no rebound and no guarding.  Musculoskeletal: She exhibits tenderness. She exhibits no edema.  Lymphadenopathy:    She has no cervical adenopathy.  Neurological: She displays normal reflexes. No cranial  nerve deficit. She exhibits normal muscle tone. Coordination normal.  Skin: No rash noted. No erythema.  Psychiatric: Her behavior is normal. Judgment and thought content normal.  lateral hips, LS tender obese  Lab Results  Component Value Date   WBC 8.5 02/08/2018   HGB 14.1 02/08/2018   HCT 44.3 02/08/2018   PLT 220 02/08/2018   GLUCOSE 128 (H) 02/08/2018   CHOL 167 09/17/2017   TRIG 92.0 09/17/2017   HDL 53.00 09/17/2017   LDLCALC 96 09/17/2017   ALT 30 09/17/2017   AST 31 09/17/2017   NA 138 02/08/2018   K 4.3 02/08/2018   CL 107 02/08/2018   CREATININE 0.70 02/08/2018   BUN 15 02/08/2018   CO2 21 (L) 02/08/2018   TSH 1.50 09/17/2017   HGBA1C 6.3 09/17/2017    Dg Chest 2 View  Result Date: 02/08/2018 CLINICAL DATA:  Posterior RIGHT sharp chest pain radiating into back and RIGHT shoulder since Friday, pain worse with movement. EXAM: CHEST - 2 VIEW COMPARISON:  Chest x-ray dated 07/25/2014. FINDINGS: Mild cardiomegaly is stable. Atherosclerotic changes noted at the aortic arch. Lungs are clear. No pleural effusion or pneumothorax seen. Degenerative spondylosis of the thoracic spine, mild to moderate in degree. No acute or suspicious osseous finding. IMPRESSION: 1. No acute findings.  No evidence of pneumonia or pulmonary edema. 2. Mild cardiomegaly. 3. Aortic atherosclerosis. Electronically Signed   By: Franki Cabot M.D.   On: 02/08/2018 11:58   Dg Shoulder Right  Result Date: 02/08/2018 CLINICAL DATA:  RIGHT shoulder pain since Friday. EXAM: RIGHT SHOULDER - 2+ VIEW COMPARISON:  None. FINDINGS: There is no evidence of fracture or dislocation. There is no evidence of arthropathy or other focal bone abnormality. Soft tissues are unremarkable. IMPRESSION: Negative. Electronically Signed   By: Franki Cabot M.D.   On: 02/08/2018 11:59    Assessment & Plan:   There are no diagnoses linked to this encounter.   No orders of the defined types were placed in this  encounter.    Follow-up: No follow-ups on file.  Walker Kehr, MD

## 2018-03-23 NOTE — Assessment & Plan Note (Signed)
Ice, heat, massage to the hips

## 2018-03-23 NOTE — Assessment & Plan Note (Signed)
Lost 3 lbs

## 2018-03-23 NOTE — Assessment & Plan Note (Signed)
Toprol XL 

## 2018-03-23 NOTE — Assessment & Plan Note (Signed)
CFS Loose wt Exercise

## 2018-04-14 DIAGNOSIS — E119 Type 2 diabetes mellitus without complications: Secondary | ICD-10-CM | POA: Diagnosis not present

## 2018-04-14 DIAGNOSIS — H52203 Unspecified astigmatism, bilateral: Secondary | ICD-10-CM | POA: Diagnosis not present

## 2018-04-14 DIAGNOSIS — H5711 Ocular pain, right eye: Secondary | ICD-10-CM | POA: Diagnosis not present

## 2018-04-14 DIAGNOSIS — H53001 Unspecified amblyopia, right eye: Secondary | ICD-10-CM | POA: Diagnosis not present

## 2018-04-14 LAB — HM DIABETES EYE EXAM

## 2018-04-22 DIAGNOSIS — L309 Dermatitis, unspecified: Secondary | ICD-10-CM | POA: Diagnosis not present

## 2018-04-23 ENCOUNTER — Encounter: Payer: Self-pay | Admitting: Internal Medicine

## 2018-05-24 DIAGNOSIS — L304 Erythema intertrigo: Secondary | ICD-10-CM | POA: Diagnosis not present

## 2018-05-25 ENCOUNTER — Ambulatory Visit (INDEPENDENT_AMBULATORY_CARE_PROVIDER_SITE_OTHER): Payer: Medicare Other | Admitting: Internal Medicine

## 2018-05-25 ENCOUNTER — Other Ambulatory Visit (INDEPENDENT_AMBULATORY_CARE_PROVIDER_SITE_OTHER): Payer: Medicare Other

## 2018-05-25 ENCOUNTER — Encounter: Payer: Self-pay | Admitting: Internal Medicine

## 2018-05-25 DIAGNOSIS — G8929 Other chronic pain: Secondary | ICD-10-CM

## 2018-05-25 DIAGNOSIS — E119 Type 2 diabetes mellitus without complications: Secondary | ICD-10-CM

## 2018-05-25 DIAGNOSIS — F419 Anxiety disorder, unspecified: Secondary | ICD-10-CM

## 2018-05-25 DIAGNOSIS — Z6836 Body mass index (BMI) 36.0-36.9, adult: Secondary | ICD-10-CM

## 2018-05-25 DIAGNOSIS — H6123 Impacted cerumen, bilateral: Secondary | ICD-10-CM

## 2018-05-25 DIAGNOSIS — M5440 Lumbago with sciatica, unspecified side: Principal | ICD-10-CM

## 2018-05-25 DIAGNOSIS — E785 Hyperlipidemia, unspecified: Secondary | ICD-10-CM | POA: Diagnosis not present

## 2018-05-25 DIAGNOSIS — F329 Major depressive disorder, single episode, unspecified: Secondary | ICD-10-CM

## 2018-05-25 DIAGNOSIS — F32A Depression, unspecified: Secondary | ICD-10-CM

## 2018-05-25 LAB — BASIC METABOLIC PANEL
BUN: 21 mg/dL (ref 6–23)
CO2: 26 mEq/L (ref 19–32)
Calcium: 9.3 mg/dL (ref 8.4–10.5)
Chloride: 106 mEq/L (ref 96–112)
Creatinine, Ser: 0.7 mg/dL (ref 0.40–1.20)
GFR: 87.91 mL/min (ref >60.00)
Glucose, Bld: 120 mg/dL — ABNORMAL HIGH (ref 70–99)
Potassium: 4.5 mEq/L (ref 3.5–5.1)
Sodium: 140 mEq/L (ref 135–145)

## 2018-05-25 LAB — HEMOGLOBIN A1C: Hgb A1c MFr Bld: 6.2 % (ref 4.6–6.5)

## 2018-05-25 MED ORDER — HYDROCODONE-ACETAMINOPHEN 7.5-325 MG PO TABS: 1.0000 | ORAL_TABLET | Freq: Three times a day (TID) | ORAL | 0 refills | Status: DC | PRN

## 2018-05-25 NOTE — Assessment & Plan Note (Signed)
CT calcium scoring info

## 2018-05-25 NOTE — Assessment & Plan Note (Signed)
Pt declined irrigation Will try at home

## 2018-05-25 NOTE — Assessment & Plan Note (Signed)
Xanax prn  Potential benefits of a long term benzodiazepines  use as well as potential risks  and complications were explained to the patient and were aknowledged. 

## 2018-05-25 NOTE — Assessment & Plan Note (Addendum)
On diet Risks associated with diet noncompliance were discussed. Compliance was encouraged. CT calcium scoring info

## 2018-05-25 NOTE — Patient Instructions (Signed)

## 2018-05-25 NOTE — Assessment & Plan Note (Signed)
Norco prn  Potential benefits of a long term opioids use as well as potential risks (i.e. addiction risk, apnea etc) and complications (i.e. Somnolence, constipation and others) were explained to the patient and were aknowledged. 

## 2018-05-25 NOTE — Assessment & Plan Note (Signed)
Wt Readings from Last 3 Encounters:  05/25/18 202 lb (91.6 kg)  03/23/18 200 lb (90.7 kg)  02/11/18 204 lb (92.5 kg)

## 2018-05-25 NOTE — Progress Notes (Signed)
Subjective:  Patient ID: Amanda Arnold, female    DOB: 1946/02/24  Age: 72 y.o. MRN: [id number]  CC: No chief complaint on file.   HPI Amanda Arnold presents for LBP worse w/activity, DM, obesity, anxiety f/u  Outpatient Medications Prior to Visit  Medication Sig Dispense Refill  . ALPRAZolam (XANAX) 0.5 MG tablet Take 1 tablet (0.5 mg total) by mouth 2 (two) times daily as needed for anxiety. 60 tablet 2  . Cholecalciferol (VITAMIN D3) 2000 UNITS capsule Take 1 capsule (2,000 Units total) by mouth daily. 100 capsule 3  . cyclobenzaprine (FLEXERIL) 5 MG tablet Take 1 tablet (5 mg total) by mouth 2 (two) times daily as needed for muscle spasms. 30 tablet 1  . fluconazole (DIFLUCAN) 100 MG tablet Take 2 tabs on day#1, then 1 tab daily on Days #2-10 11 tablet 1  . HYDROcodone-acetaminophen (NORCO) 7.5-325 MG tablet Take 1 tablet by mouth every 8 (eight) hours as needed for severe pain. 90 tablet 0  . ketoconazole (NIZORAL) 2 % cream Apply 1 application topically 2 (two) times daily. In the groin 60 g 1  . meclizine (ANTIVERT) 12.5 MG tablet Take 1-2 tablets (12.5-25 mg total) by mouth 3 (three) times daily as needed for dizziness. 60 tablet 1  . metoprolol succinate (TOPROL-XL) 25 MG 24 hr tablet Take 0.5 tablets (12.5 mg total) by mouth at bedtime. 45 tablet 3  . pramipexole (MIRAPEX) 0.125 MG tablet Take 1-2 tablets (0.125-0.25 mg total) by mouth at bedtime. 60 tablet 5  . umeclidinium-vilanterol (ANORO ELLIPTA) 62.5-25 MCG/INH AEPB Inhale 1 puff into the lungs daily. 1 each 11   No facility-administered medications prior to visit.     ROS: Review of Systems  Constitutional: Negative for activity change, appetite change, chills, fatigue and unexpected weight change.  HENT: Negative for congestion, mouth sores and sinus pressure.   Eyes: Negative for visual disturbance.  Respiratory: Negative for cough and chest tightness.   Gastrointestinal: Negative for abdominal pain and  nausea.  Genitourinary: Negative for difficulty urinating, frequency and vaginal pain.  Musculoskeletal: Positive for back pain and gait problem.  Skin: Negative for pallor and rash.  Neurological: Negative for dizziness, tremors, weakness, numbness and headaches.  Psychiatric/Behavioral: Negative for confusion and sleep disturbance.    Objective:  BP 128/76 (BP Location: Left Arm, Patient Position: Sitting, Cuff Size: Large)   Pulse 67   Temp 98.4 F (36.9 C) (Oral)   Ht 5\' 2"  (1.575 m)   Wt 202 lb (91.6 kg)   SpO2 97%   BMI 36.95 kg/m   BP Readings from Last 3 Encounters:  05/25/18 128/76  03/23/18 138/80  02/11/18 138/80    Wt Readings from Last 3 Encounters:  05/25/18 202 lb (91.6 kg)  03/23/18 200 lb (90.7 kg)  02/11/18 204 lb (92.5 kg)    Physical Exam  Constitutional: She appears well-developed. No distress.  HENT:  Head: Normocephalic.  Right Ear: External ear normal.  Left Ear: External ear normal.  Nose: Nose normal.  Mouth/Throat: Oropharynx is clear and moist.  Eyes: Pupils are equal, round, and reactive to light. Conjunctivae are normal. Right eye exhibits no discharge. Left eye exhibits no discharge.  Neck: Normal range of motion. Neck supple. No JVD present. No tracheal deviation present. No thyromegaly present.  Cardiovascular: Normal rate, regular rhythm and normal heart sounds.  Pulmonary/Chest: No stridor. No respiratory distress. She has no wheezes.  Abdominal: Soft. Bowel sounds are normal. She exhibits no distension and no  mass. There is no tenderness. There is no rebound and no guarding.  Musculoskeletal: She exhibits tenderness. She exhibits no edema.  Lymphadenopathy:    She has no cervical adenopathy.  Neurological: She displays normal reflexes. No cranial nerve deficit. She exhibits normal muscle tone. Coordination normal.  Skin: No rash noted. No erythema.  Psychiatric: She has a normal mood and affect. Her behavior is normal. Judgment and  thought content normal.   Obese LS tender Wax B  Lab Results  Component Value Date   WBC 8.5 02/08/2018   HGB 14.1 02/08/2018   HCT 44.3 02/08/2018   PLT 220 02/08/2018   GLUCOSE 128 (H) 02/08/2018   CHOL 167 09/17/2017   TRIG 92.0 09/17/2017   HDL 53.00 09/17/2017   LDLCALC 96 09/17/2017   ALT 30 09/17/2017   AST 31 09/17/2017   NA 138 02/08/2018   K 4.3 02/08/2018   CL 107 02/08/2018   CREATININE 0.70 02/08/2018   BUN 15 02/08/2018   CO2 21 (L) 02/08/2018   TSH 1.50 09/17/2017   HGBA1C 6.3 09/17/2017    Dg Chest 2 View  Result Date: 02/08/2018 CLINICAL DATA:  Posterior RIGHT sharp chest pain radiating into back and RIGHT shoulder since Friday, pain worse with movement. EXAM: CHEST - 2 VIEW COMPARISON:  Chest x-ray dated 07/25/2014. FINDINGS: Mild cardiomegaly is stable. Atherosclerotic changes noted at the aortic arch. Lungs are clear. No pleural effusion or pneumothorax seen. Degenerative spondylosis of the thoracic spine, mild to moderate in degree. No acute or suspicious osseous finding. IMPRESSION: 1. No acute findings.  No evidence of pneumonia or pulmonary edema. 2. Mild cardiomegaly. 3. Aortic atherosclerosis. Electronically Signed   By: Franki Cabot M.D.   On: 02/08/2018 11:58   Dg Shoulder Right  Result Date: 02/08/2018 CLINICAL DATA:  RIGHT shoulder pain since Friday. EXAM: RIGHT SHOULDER - 2+ VIEW COMPARISON:  None. FINDINGS: There is no evidence of fracture or dislocation. There is no evidence of arthropathy or other focal bone abnormality. Soft tissues are unremarkable. IMPRESSION: Negative. Electronically Signed   By: Franki Cabot M.D.   On: 02/08/2018 11:59    Assessment & Plan:   There are no diagnoses linked to this encounter.   No orders of the defined types were placed in this encounter.    Follow-up: No follow-ups on file.  Walker Kehr, MD

## 2018-06-28 ENCOUNTER — Emergency Department (HOSPITAL_COMMUNITY): Payer: Medicare Other

## 2018-06-28 ENCOUNTER — Other Ambulatory Visit: Payer: Self-pay

## 2018-06-28 ENCOUNTER — Encounter (HOSPITAL_COMMUNITY): Payer: Self-pay

## 2018-06-28 ENCOUNTER — Emergency Department (HOSPITAL_COMMUNITY)
Admission: EM | Admit: 2018-06-28 | Discharge: 2018-06-29 | Disposition: A | Discharge status: Home / Self Care | Payer: Medicare Other | Attending: Emergency Medicine | Admitting: Emergency Medicine

## 2018-06-28 ENCOUNTER — Ambulatory Visit: Payer: Self-pay

## 2018-06-28 DIAGNOSIS — I10 Essential (primary) hypertension: Secondary | ICD-10-CM | POA: Insufficient documentation

## 2018-06-28 DIAGNOSIS — F17210 Nicotine dependence, cigarettes, uncomplicated: Secondary | ICD-10-CM | POA: Diagnosis not present

## 2018-06-28 DIAGNOSIS — J449 Chronic obstructive pulmonary disease, unspecified: Secondary | ICD-10-CM | POA: Insufficient documentation

## 2018-06-28 DIAGNOSIS — M25551 Pain in right hip: Secondary | ICD-10-CM | POA: Diagnosis not present

## 2018-06-28 DIAGNOSIS — N390 Urinary tract infection, site not specified: Secondary | ICD-10-CM | POA: Insufficient documentation

## 2018-06-28 DIAGNOSIS — M549 Dorsalgia, unspecified: Secondary | ICD-10-CM | POA: Diagnosis not present

## 2018-06-28 DIAGNOSIS — Z79899 Other long term (current) drug therapy: Secondary | ICD-10-CM | POA: Diagnosis not present

## 2018-06-28 DIAGNOSIS — M79604 Pain in right leg: Secondary | ICD-10-CM

## 2018-06-28 MED ORDER — DIAZEPAM 5 MG PO TABS
5.0000 mg | ORAL_TABLET | Freq: Once | ORAL | Status: AC
  Administered 2018-06-28: 5 mg via ORAL
  Filled 2018-06-28: qty 1

## 2018-06-28 MED ORDER — KETOROLAC TROMETHAMINE 60 MG/2ML IM SOLN
30.0000 mg | Freq: Once | INTRAMUSCULAR | Status: AC
  Administered 2018-06-28: 30 mg via INTRAMUSCULAR
  Filled 2018-06-28: qty 2

## 2018-06-28 NOTE — ED Triage Notes (Signed)
Pt is alert and oriented x 4 and is verbally responsive. Pt reports that she has Rt hip pain and Rt leg pain 10/10 burning x 1 day> Pt reports that pain worsens with walking but burning in leg resides despite resting. Pt also reports urinary frequency and oliguria.

## 2018-06-28 NOTE — Telephone Encounter (Signed)
Pt called with C/O pain to her rt hip that starts in her side and back and travels down her right leg to her knee.  She states the pain is severe. She says the leg burns to the knee. She denies rash or redness. Walking make the pain worse. Pt sounds very uncomfortable. She states she took an asa today because she could feel the pain in her rt chest. She has no fever to report. Per protocol pt will be evaluated in the ED. Care advice read to patient. Pt verbalized understanding of all instructions.  Reason for Disposition . [1] SEVERE pain (e.g., excruciating, unable to do any normal activities) AND [2] fever  Answer Assessment - Initial Assessment Questions 1. LOCATION and RADIATION: "Where is the pain located?"     Rt hip side and down leg to knee 2. QUALITY: "What does the pain feel like?"  (e.g., sharp, dull, aching, burning)     burning 3. SEVERITY: "How bad is the pain?" "What does it keep you from doing?"   (Scale 1-10; or mild, moderate, severe)   -  MILD (1-3): doesn't interfere with normal activities    -  MODERATE (4-7): interferes with normal activities (e.g., work or school) or awakens from sleep, limping    -  SEVERE (8-10): excruciating pain, unable to do any normal activities, unable to walk    moderate 4. ONSET: "When did the pain start?" "Does it come and go, or is it there all the time?"     Sunday 5. WORK OR EXERCISE: "Has there been any recent work or exercise that involved this part of the body?"      no 6. CAUSE: "What do you think is causing the hip pain?"      Not sure what causes this pain 7. AGGRAVATING FACTORS: "What makes the hip pain worse?" (e.g., walking, climbing stairs, running)     Walking lying on left side will help pain 8. OTHER SYMPTOMS: "Do you have any other symptoms?" (e.g., back pain, pain shooting down leg,  fever, rash)     Back lower rt chest pain  Protocols used: HIP PAIN-A-AH

## 2018-06-28 NOTE — ED Notes (Signed)
Pt to xray

## 2018-06-28 NOTE — ED Provider Notes (Signed)
[facility] DEPT Provider Note   CSN: [id number] Arrival date & time: 06/28/18  2001     History   Chief Complaint Chief Complaint  Patient presents with  . Leg Pain  . Hip Pain    HPI EDITHE DOBBIN is a 72 y.o. female.  This is a 72 year old female presents with atraumatic right-sided flank pain that goes to her right anterior hip.  Pain is characterized as a burning with skin sensitivity.  She denies any abdominal pain.  No fever or chills.  Denies any right foot drop.  No bowel or bladder dysfunction.  Pain is positional and worse with standing.  Patient has been taking her home hydrocodone with some relief.  No prior history of same.     Past Medical History:  Diagnosis Date  . Abdominal pain, epigastric 12/18/2009  . ANXIETY 05/25/2006  . BRONCHITIS, ACUTE 12/08/2007  . COPD 05/25/2006  . DEPRESSION 01/02/2006  . ESOTROPIA, LEFT EYE 10/31/1947  . FIBROMYALGIA 04/02/2006  . Headache(784.0) 12/08/2007  . HYPERGLYCEMIA 05/15/2010  . HYPERLIPIDEMIA 04/30/2006  . HYPERTENSION 05/25/2006  . Hypertension   . INSOMNIA, PERSISTENT 02/22/2008  . Lazy eye    left  . LOW BACK PAIN 01/27/2007  . Migraines   . 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 OBSTRUCTIVE SLEEP APNEA 04/17/2010   does not use CPAP  . OSTEOARTHRITIS 05/25/2006  . Polyuria   . Pre-diabetes   . Psychosomatic disease 2011  . 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 T M J 05/11/2009    Patient Active Problem List   Diagnosis Date Noted  . Trochanteric bursitis of both hips 03/23/2018  . Neck pain 02/11/2018  . Peroneal mononeuropathy, left 01/26/2017  . Skin cyst 09/26/2016  . Adjustment disorder with mixed anxiety and depressed mood 06/27/2016  . Fatigue 03/28/2016  . Restless leg syndrome 03/28/2016  . RLQ abdominal pain 08/15/2015  . Well adult exam 07/03/2015  . Meralgia paraesthetica 05/29/2015  . Paresthesia 05/15/2015  . Sciatic leg pain 09/22/2014  . Rash and  nonspecific skin eruption 09/11/2014  . Right lower quadrant pain 09/11/2014  . Nonspecific abnormal electrocardiogram (ECG) (EKG) 07/27/2014  . Precordial pain 07/27/2014  . Cerumen impaction 06/16/2014  . Chest pain, atypical 03/03/2014  . UTI (urinary tract infection) 03/29/2013  . Herpes zoster 03/02/2013  . Abscess of left leg 02/02/2013  . Leg pain, bilateral 12/09/2012  . Left groin pain 09/09/2012  . Unspecified sinusitis (chronic) 08/25/2012  . Chest wall pain 05/20/2012  . Nocturia 12/24/2011  . Psychosomatic pain 10/14/2011  . Left shoulder pain 08/22/2011  . Vertigo 06/13/2011  . Syncopal vertigo 06/13/2011  . Intertriginous candidiasis 03/07/2011  . Diabetes type 2, controlled ([place]) 03/07/2011  . HIP PAIN 06/17/2010  . HYPERGLYCEMIA 05/15/2010  . CENTRAL SLEEP APNEA CONDS CLASSIFIED ELSEWHERE 05/14/2010  . Obstructive sleep apnea 04/17/2010  . Obesity 04/10/2010  . 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 DYSPNEA 03/15/2010  . TRIGGER FINGER 12/18/2009  . ABDOMINAL PAIN, EPIGASTRIC 12/18/2009  . KNEE PAIN 07/11/2009  . 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 COLD SORE 05/11/2009  . T M J 05/11/2009  . BREAST PAIN 04/13/2009  . Edema 03/01/2009  . CERVICAL LYMPHADENOPATHY 03/01/2009  . PRURITUS 07/28/2008  . TACHYCARDIA 07/28/2008  . INSOMNIA, PERSISTENT 02/22/2008  . Cystitis 02/22/2008  . SWEATING 01/18/2008  . BRONCHITIS, ACUTE 12/08/2007  . Chronic fatigue 12/08/2007  . Headache(784.0) 12/08/2007  . COUGH 12/08/2007  . ARTHRALGIA 11/19/2007  . LOW BACK PAIN 01/27/2007  . Tobacco  use disorder 11/28/2006  . Generalized anxiety disorder 05/25/2006  . Essential hypertension 05/25/2006  . COPD exacerbation ([place]) 05/25/2006  . OSTEOARTHRITIS 05/25/2006  . Polyuria 05/25/2006  . Dyslipidemia 04/30/2006  . Myalgia 04/02/2006  . Anxiety and depression 01/02/2006  . Disturbance of skin sensation 05/29/2005  . ESOTROPIA, LEFT EYE Jun 08, 1946    Past Surgical  History:  Procedure Laterality Date  . CHOLECYSTECTOMY    . MASS EXCISION Left 10/06/2016   Procedure: EXCISION LEFT BUTTOCKS MASS;  Surgeon: Alphonsa Overall, MD;  Location: [facility];  Service: General;  Laterality: Left;     OB History   No obstetric history on file.      Home Medications    Prior to Admission medications   Medication Sig Start Date End Date Taking? Authorizing Provider  ALPRAZolam Duanne Moron) 0.5 MG tablet Take 1 tablet (0.5 mg total) by mouth 2 (two) times daily as needed for anxiety. 12/21/17   Plotnikov, Evie Lacks, MD  Cholecalciferol (VITAMIN D3) 2000 UNITS capsule Take 1 capsule (2,000 Units total) by mouth daily. 08/22/14   Plotnikov, Evie Lacks, MD  cyclobenzaprine (FLEXERIL) 5 MG tablet Take 1 tablet (5 mg total) by mouth 2 (two) times daily as needed for muscle spasms. 02/11/18   Plotnikov, Evie Lacks, MD  fluconazole (DIFLUCAN) 100 MG tablet Take 2 tabs on day#1, then 1 tab daily on Days #2-10 01/27/17   Plotnikov, Evie Lacks, MD  HYDROcodone-acetaminophen (NORCO) 7.5-325 MG tablet Take 1 tablet by mouth every 8 (eight) hours as needed for severe pain. 05/25/18   Plotnikov, Evie Lacks, MD  ketoconazole (NIZORAL) 2 % cream Apply 1 application topically 2 (two) times daily. In the groin 09/17/17   Plotnikov, Evie Lacks, MD  meclizine (ANTIVERT) 12.5 MG tablet Take 1-2 tablets (12.5-25 mg total) by mouth 3 (three) times daily as needed for dizziness. 09/17/17   Plotnikov, Evie Lacks, MD  metoprolol succinate (TOPROL-XL) 25 MG 24 hr tablet Take 0.5 tablets (12.5 mg total) by mouth at bedtime. 02/08/18   Plotnikov, Evie Lacks, MD  pramipexole (MIRAPEX) 0.125 MG tablet Take 1-2 tablets (0.125-0.25 mg total) by mouth at bedtime. 12/21/17   Plotnikov, Evie Lacks, MD  umeclidinium-vilanterol (ANORO ELLIPTA) 62.5-25 MCG/INH AEPB Inhale 1 puff into the lungs daily. 11/09/15   Plotnikov, Evie Lacks, MD    Family History Family History  Problem Relation Age of Onset  . Heart attack  Father   . Hypertension Brother   . Heart disease Mother   . Colon cancer Neg Hx   . Stomach cancer Neg Hx     Social History Social History   Tobacco Use  . Smoking status: Current Some Day Smoker    Types: Cigarettes    Last attempt to quit: 07/10/2013    Years since quitting: 4.9  . Smokeless tobacco: Current User    Types: Snuff  . Tobacco comment: smokes 2-4 cigs, maybe more when anxious  Substance Use Topics  . Alcohol use: No    Alcohol/week: 0.0 standard drinks  . Drug use: No     Allergies   Morphine and related; Bupropion hcl; Duloxetine; Ezetimibe; Metformin and related; Trazodone hcl; and Tramadol hcl   Review of Systems Review of Systems  All other systems reviewed and are negative.    Physical Exam Updated Vital Signs BP 130/64   Pulse 70   Temp 98.5 F (36.9 C) (Oral)   Resp (!) 22   Ht 1.575 m (5\' 2" )   Wt 92.5 kg  SpO2 99%   BMI 37.31 kg/m   Physical Exam Vitals signs and nursing note reviewed.  Constitutional:      General: She is not in acute distress.    Appearance: Normal appearance. She is well-developed. She is not toxic-appearing.  HENT:     Head: Normocephalic and atraumatic.  Eyes:     General: Lids are normal.     Conjunctiva/sclera: Conjunctivae normal.     Pupils: Pupils are equal, round, and reactive to light.  Neck:     Musculoskeletal: Normal range of motion and neck supple.     Thyroid: No thyroid mass.     Trachea: No tracheal deviation.  Cardiovascular:     Rate and Rhythm: Normal rate and regular rhythm.     Heart sounds: Normal heart sounds. No murmur. No gallop.   Pulmonary:     Effort: Pulmonary effort is normal. No respiratory distress.     Breath sounds: Normal breath sounds. No stridor. No decreased breath sounds, wheezing, rhonchi or rales.  Abdominal:     General: Bowel sounds are normal. There is no distension.     Palpations: Abdomen is soft.     Tenderness: There is no abdominal tenderness. There  is no rebound.    Musculoskeletal: Normal range of motion.        General: No tenderness.       Back:  Skin:    General: Skin is warm and dry.     Findings: No abrasion or rash.  Neurological:     Mental Status: She is alert and oriented to person, place, and time.     GCS: GCS eye subscore is 4. GCS verbal subscore is 5. GCS motor subscore is 6.     Cranial Nerves: No cranial nerve deficit.     Sensory: No sensory deficit.     Comments: Strength is 5 out of 5 in all extremities.  Psychiatric:        Speech: Speech normal.        Behavior: Behavior normal.      ED Treatments / Results  Labs (all labs ordered are listed, but only abnormal results are displayed) Labs Reviewed  URINALYSIS, ROUTINE W REFLEX MICROSCOPIC    EKG None  Radiology Dg Hip Unilat  With Pelvis 2-3 Views Right  Result Date: 06/28/2018 CLINICAL DATA:  Right hip pain since Sunday.  No known injury. EXAM: DG HIP (WITH OR WITHOUT PELVIS) 2-3V RIGHT COMPARISON:  CT abdomen pelvis dated February 09, 2016. FINDINGS: No acute fracture or dislocation. The hip joint spaces are preserved. The pubic symphysis and sacroiliac joints are unremarkable. Unchanged moderate lower lumbar degenerative disc disease. Unchanged calcified fibroid in the pelvis. Osteopenia. Soft tissues are unremarkable. IMPRESSION: 1.  No acute osseous abnormality. Electronically Signed   By: Titus Dubin M.D.   On: 06/28/2018 21:13    Procedures Procedures (including critical care time)  Medications Ordered in ED Medications  ketorolac (TORADOL) injection 30 mg (has no administration in time range)  diazepam (VALIUM) tablet 5 mg (has no administration in time range)     Initial Impression / Assessment and Plan / ED Course  I have reviewed the triage vital signs and the nursing notes.  Pertinent labs & imaging results that were available during my care of the patient were reviewed by me and considered in my medical decision making (see  chart for details).     Patient medicated for pain and feels better here.  Patient's symptoms are  likely from nerve involvement as she does not have any foot drop but does have some involvement of the superficial nerve.  Will prescribe Valium and she will continue taking her hydrocodone at home.  Final Clinical Impressions(s) / ED Diagnoses   Final diagnoses:  None    ED Discharge Orders    None       Lacretia Leigh, MD 06/29/18 0205

## 2018-06-29 LAB — URINALYSIS, ROUTINE W REFLEX MICROSCOPIC
Bilirubin Urine: NEGATIVE
Glucose, UA: NEGATIVE mg/dL
Hgb urine dipstick: NEGATIVE
Ketones, ur: 5 mg/dL — AB
Nitrite: NEGATIVE
Protein, ur: NEGATIVE mg/dL
Specific Gravity, Urine: 1.021 (ref 1.005–1.030)
pH: 5 (ref 5.0–8.0)

## 2018-06-29 MED ORDER — CEPHALEXIN 500 MG PO CAPS: 500.0000 mg | ORAL_CAPSULE | Freq: Three times a day (TID) | ORAL | 0 refills | Status: DC

## 2018-06-29 MED ORDER — DIAZEPAM 2 MG PO TABS: 2.0000 mg | ORAL_TABLET | Freq: Four times a day (QID) | ORAL | 0 refills | Status: DC | PRN

## 2018-06-29 NOTE — Telephone Encounter (Signed)
Pt went to ED

## 2018-07-01 ENCOUNTER — Other Ambulatory Visit: Payer: Self-pay | Admitting: Family Medicine

## 2018-07-01 ENCOUNTER — Ambulatory Visit (INDEPENDENT_AMBULATORY_CARE_PROVIDER_SITE_OTHER): Payer: Medicare Other | Admitting: Family Medicine

## 2018-07-01 VITALS — BP 136/70 | HR 70 | Temp 97.9°F | Ht 62.0 in

## 2018-07-01 DIAGNOSIS — M25551 Pain in right hip: Secondary | ICD-10-CM | POA: Diagnosis not present

## 2018-07-01 MED ORDER — DICLOFENAC SODIUM 75 MG PO TBEC: 75.0000 mg | DELAYED_RELEASE_TABLET | Freq: Two times a day (BID) | ORAL | 0 refills | Status: DC

## 2018-07-01 NOTE — Progress Notes (Signed)
Subjective:    Patient ID: Amanda Arnold, female    DOB: 1946/01/16, 72 y.o.   MRN: [id number]  HPI  Amanda Arnold is a 72 year old female who presents today with right leg/hip pain  She was evaluated for symptoms in the ED on 06/28/18  In the ED, she was provided ketorolac and diazepam for symptoms that provided temporary benefit  X-ray in the ED on 06/28/18 of hip/pelvis did not reveal acute fracture or dislocation. Unchanged moderate lower lumbar degenerative disc disease was noted. No acute osseous abnormality present.  UA revealed small leukocytes.  She was prescribed cephalexin which she is still taking and denies dysuria, frequency, urgency, or blood in urine. She reports that symptoms of cystitis have improved. Cyclobenzaprine did not provide benefit for hip/leg pain and caused a headache so she stopped using this medication.   Leg Pain presented on 06/27/18 Pain is rated as 5  Pain is described as burning Treatment: Nothing; stopped cyclobenzaprine. She currently takes Norco that is not new to her and states that this provides "some" benefit Aggravating factor: standing and walking Alleviating factor: sitting Warmth: No Erythema: No Calf tenderness: No Prolonged immobility: No Recent hospitalization: No Obesity: Yes History of hormone replacement: No History of VTE: No   Review of Systems  Constitutional: Negative for chills, fatigue and fever.  Respiratory: Negative for cough, shortness of breath and wheezing.   Cardiovascular: Negative for chest pain and palpitations.  Gastrointestinal: Negative for abdominal pain.  Genitourinary: Negative for dysuria.  Musculoskeletal:       Right hip/leg pain  Skin: Negative for rash.  Neurological: Negative for dizziness, weakness, light-headedness and headaches.   Past Medical History:  Diagnosis Date  . Abdominal pain, epigastric 12/18/2009  . ANXIETY 05/25/2006  . BRONCHITIS, ACUTE 12/08/2007  . COPD 05/25/2006  .  DEPRESSION 01/02/2006  . ESOTROPIA, LEFT EYE 1945/12/05  . FIBROMYALGIA 04/02/2006  . Headache(784.0) 12/08/2007  . HYPERGLYCEMIA 05/15/2010  . HYPERLIPIDEMIA 04/30/2006  . HYPERTENSION 05/25/2006  . Hypertension   . INSOMNIA, PERSISTENT 02/22/2008  . Lazy eye    left  . LOW BACK PAIN 01/27/2007  . Migraines   . 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 OBSTRUCTIVE SLEEP APNEA 04/17/2010   does not use CPAP  . OSTEOARTHRITIS 05/25/2006  . Polyuria   . Pre-diabetes   . Psychosomatic disease 2011  . 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 T M J 05/11/2009     Social History   Socioeconomic History  . Marital status: Married    Spouse name: Not on file  . Number of children: 5  . Years of education: Not on file  . Highest education level: Not on file  Occupational History  . Occupation: house wife    Employer: UNEMPLOYED  Social Needs  . Financial resource strain: Not on file  . Food insecurity:    Worry: Not on file    Inability: Not on file  . Transportation needs:    Medical: Not on file    Non-medical: Not on file  Tobacco Use  . Smoking status: Current Some Day Smoker    Types: Cigarettes    Last attempt to quit: 07/10/2013    Years since quitting: 4.9  . Smokeless tobacco: Current User    Types: Snuff  . Tobacco comment: smokes 2-4 cigs, maybe more when anxious  Substance and Sexual Activity  . Alcohol use: No    Alcohol/week: 0.0 standard drinks  . Drug use: No  . Sexual activity:  Not Currently  Lifestyle  . Physical activity:    Days per week: Not on file    Minutes per session: Not on file  . Stress: Not on file  Relationships  . Social connections:    Talks on phone: Not on file    Gets together: Not on file    Attends religious service: Not on file    Active member of club or organization: Not on file    Attends meetings of clubs or organizations: Not on file    Relationship status: Not on file  . Intimate partner violence:    Fear of current or ex partner:  Not on file    Emotionally abused: Not on file    Physically abused: Not on file    Forced sexual activity: Not on file  Other Topics Concern  . Not on file  Social History Narrative  . Not on file    Past Surgical History:  Procedure Laterality Date  . CHOLECYSTECTOMY    . MASS EXCISION Left 10/06/2016   Procedure: EXCISION LEFT BUTTOCKS MASS;  Surgeon: Alphonsa Overall, MD;  Location: [facility];  Service: General;  Laterality: Left;    Family History  Problem Relation Age of Onset  . Heart attack Father   . Hypertension Brother   . Heart disease Mother   . Colon cancer Neg Hx   . Stomach cancer Neg Hx     Allergies  Allergen Reactions  . Morphine And Related Shortness Of Breath  . Bupropion Hcl Other (See Comments)    "does not feel right"  . Duloxetine Other (See Comments)    REACTION: did not feel good  . Ezetimibe Other (See Comments)    REACTION: legs burning  . Metformin And Related Diarrhea    Diarrhea w/XR or regular   . Trazodone Hcl Swelling  . Tramadol Hcl Palpitations    REACTION: palpitations    Current Outpatient Medications on File Prior to Visit  Medication Sig Dispense Refill  . ALPRAZolam (XANAX) 0.5 MG tablet Take 1 tablet (0.5 mg total) by mouth 2 (two) times daily as needed for anxiety. 60 tablet 2  . cephALEXin (KEFLEX) 500 MG capsule Take 1 capsule (500 mg total) by mouth 3 (three) times daily. 21 capsule 0  . Cholecalciferol (VITAMIN D3) 2000 UNITS capsule Take 1 capsule (2,000 Units total) by mouth daily. 100 capsule 3  . cyclobenzaprine (FLEXERIL) 5 MG tablet Take 1 tablet (5 mg total) by mouth 2 (two) times daily as needed for muscle spasms. 30 tablet 1  . fluconazole (DIFLUCAN) 100 MG tablet Take 2 tabs on day#1, then 1 tab daily on Days #2-10 11 tablet 1  . HYDROcodone-acetaminophen (NORCO) 7.5-325 MG tablet Take 1 tablet by mouth every 8 (eight) hours as needed for severe pain. 90 tablet 0  . ketoconazole (NIZORAL) 2 % cream  Apply 1 application topically 2 (two) times daily. In the groin 60 g 1  . meclizine (ANTIVERT) 12.5 MG tablet Take 1-2 tablets (12.5-25 mg total) by mouth 3 (three) times daily as needed for dizziness. 60 tablet 1  . metoprolol succinate (TOPROL-XL) 25 MG 24 hr tablet Take 0.5 tablets (12.5 mg total) by mouth at bedtime. 45 tablet 3  . pramipexole (MIRAPEX) 0.125 MG tablet Take 1-2 tablets (0.125-0.25 mg total) by mouth at bedtime. 60 tablet 5  . umeclidinium-vilanterol (ANORO ELLIPTA) 62.5-25 MCG/INH AEPB Inhale 1 puff into the lungs daily. 1 each 11  . diazepam (VALIUM) 2 MG  tablet Take 1 tablet (2 mg total) by mouth every 6 (six) hours as needed for muscle spasms. (Patient not taking: Reported on 07/01/2018) 15 tablet 0   No current facility-administered medications on file prior to visit.     BP 136/70 (BP Location: Left Arm, Patient Position: Sitting, Cuff Size: Large)   Pulse 70   Temp 97.9 F (36.6 C) (Oral)   Ht 5\' 2"  (1.575 m)   SpO2 97%   BMI 37.31 kg/m        Objective:   Physical Exam Constitutional:      Appearance: She is obese.  Eyes:     General: No scleral icterus.    Pupils: Pupils are equal, round, and reactive to light.  Cardiovascular:     Rate and Rhythm: Normal rate and regular rhythm.     Pulses: Normal pulses.     Heart sounds: Normal heart sounds.  Pulmonary:     Effort: Pulmonary effort is normal.     Breath sounds: Normal breath sounds. No wheezing or rales.  Abdominal:     General: Abdomen is flat. Bowel sounds are normal.     Tenderness: There is no abdominal tenderness. There is no right CVA tenderness, left CVA tenderness, guarding or rebound.  Musculoskeletal:     Comments: Hip: No pelvic alignment abnormality appreciated with inspection and palpation. Standing without unsteadiness; causes discomfort with ambulation and particularly when initiating ambulation. Greater trochanter tenderness with palpation. Tenderness over piriformis and  greater trochanter. Patient teary with exam and ambulation.  Skin:    General: Skin is warm and dry.  Neurological:     Mental Status: She is alert.  Psychiatric:        Mood and Affect: Mood normal.        Behavior: Behavior normal.        Thought Content: Thought content normal.        Judgment: Judgment normal.       Assessment & Plan:  1. Right hip pain Exam limited as patient did not want to complete ROM and did not want to be supine except for a very limited time. Tenderness to palpation present as noted in exam. X-ray of hip in ED on 06/28/18 was negative for acute osseous abnormality. She is not interested in taking cyclobenzaprine as she stated this was not helpful and caused a headache. We discussed that a short term antiinflammatory can be considered and referral to sports medicine for further evaluation and treatment can be completed.  Advised short term use of diclofenac and discussed that this medication should be taken with food. Further advised that she avoid prolonged sitting or standing and keep walking as pain does improve somewhat as compared to initiating ambulation.  Appointment with sports medicine made for 07/02/18. Close return precautions advised. She and her daughter agreed with plan.  - diclofenac (VOLTAREN) 75 MG EC tablet; Take 1 tablet (75 mg total) by mouth 2 (two) times daily.  Dispense: 30 tablet; Refill: 0  Delano Metz, FNP-C

## 2018-07-01 NOTE — Patient Instructions (Signed)
It was a pleasure to meet you today.  Please take this medication with food as directed and follow up for further evaluation tomorrow with sports medicine. This medication is for short term use only.   Hip Pain  The hip is the joint between the upper legs and the lower pelvis. The bones, cartilage, tendons, and muscles of your hip joint support your body and allow you to move around. Hip pain can range from a minor ache to severe pain in one or both of your hips. The pain may be felt on the inside of the hip joint near the groin, or the outside near the buttocks and upper thigh. You may also have swelling or stiffness. Follow these instructions at home: Managing pain, stiffness, and swelling  If directed, apply ice to the injured area. ? Put ice in a plastic bag. ? Place a towel between your skin and the bag. ? Leave the ice on for 20 minutes, 2-3 times a day  Sleep with a pillow between your legs on your most comfortable side.  Avoid any activities that cause pain. General instructions  Take over-the-counter and prescription medicines only as told by your health care provider.  Do any exercises as told by your health care provider.  Record the following: ? How often you have hip pain. ? The location of your pain. ? What the pain feels like. ? What makes the pain worse.  Keep all follow-up visits as told by your health care provider. This is important. Contact a health care provider if:  You cannot put weight on your leg.  Your pain or swelling continues or gets worse after one week.  It gets harder to walk.  You have a fever. Get help right away if:  You fall.  You have a sudden increase in pain and swelling in your hip.  Your hip is red or swollen or very tender to touch. Summary  Hip pain can range from a minor ache to severe pain in one or both of your hips.  The pain may be felt on the inside of the hip joint near the groin, or the outside near the buttocks  and upper thigh.  Avoid any activities that cause pain.  Record how often you have hip pain, the location of the pain, what makes it worse and what it feels like. This information is not intended to replace advice given to you by your health care provider. Make sure you discuss any questions you have with your health care provider. Document Released: 12/18/2009 Document Revised: 06/02/2016 Document Reviewed: 06/02/2016 Elsevier Interactive Patient Education  2019 [employer].

## 2018-07-02 ENCOUNTER — Ambulatory Visit (INDEPENDENT_AMBULATORY_CARE_PROVIDER_SITE_OTHER): Payer: Medicare Other | Admitting: Family Medicine

## 2018-07-02 ENCOUNTER — Encounter: Payer: Self-pay | Admitting: Family Medicine

## 2018-07-02 DIAGNOSIS — M5430 Sciatica, unspecified side: Secondary | ICD-10-CM

## 2018-07-02 MED ORDER — PREDNISONE 5 MG PO TABS: ORAL_TABLET | ORAL | 0 refills | Status: DC

## 2018-07-02 MED ORDER — GABAPENTIN 300 MG PO CAPS: 300.0000 mg | ORAL_CAPSULE | Freq: Three times a day (TID) | ORAL | 1 refills | Status: DC

## 2018-07-02 NOTE — Progress Notes (Signed)
Amanda Arnold - 72 y.o. female MRN [id number]  Date of birth: 09-16-1945  SUBJECTIVE:  Including CC & ROS.  No chief complaint on file.   Amanda Arnold is a 72 y.o. female that is presenting with right-sided lower back pain and right anterior leg sciatic type symptoms.  These have been ongoing for about a week.  The pain is severe in nature.  She denies any inciting event.  It is similar to previous pain that she is experienced.  Denies any saddle anesthesia or urinary incontinence.  Has not had improvement with medications to date.  Has been using assistance with walking.  Has pain with lying down.  Pain is sharp and stabbing in nature.  She feels this in the lower back with radiation down the right buttock and posterior aspect of her thigh as well as anterior aspect of her thigh.  Denies any numbness.  Independent review of the lumbar spine x-ray from 12/16 shows a frequent degenerative changes of the lumbar spine and multiple level spondylosis.  Independent review of the right hip x-ray from 12/16 shows right hip joint.  Review of Systems  Constitutional: Negative for fever.  HENT: Negative for congestion.   Respiratory: Negative for cough.   Cardiovascular: Negative for chest pain.  Gastrointestinal: Negative for abdominal pain.  Musculoskeletal: Positive for back pain and gait problem.  Skin: Negative for color change.  Neurological: Negative for weakness.  Hematological: Negative for adenopathy.  Psychiatric/Behavioral: Negative for agitation.    HISTORY: Past Medical, Surgical, Social, and Family History Reviewed & Updated per EMR.   Pertinent Historical Findings include:  Past Medical History:  Diagnosis Date  . Abdominal pain, epigastric 12/18/2009  . ANXIETY 05/25/2006  . BRONCHITIS, ACUTE 12/08/2007  . COPD 05/25/2006  . DEPRESSION 01/02/2006  . ESOTROPIA, LEFT EYE Aug 13, 1945  . FIBROMYALGIA 04/02/2006  . Headache(784.0) 12/08/2007  . HYPERGLYCEMIA 05/15/2010  .  HYPERLIPIDEMIA 04/30/2006  . HYPERTENSION 05/25/2006  . Hypertension   . INSOMNIA, PERSISTENT 02/22/2008  . Lazy eye    left  . LOW BACK PAIN 01/27/2007  . Migraines   . 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 OBSTRUCTIVE SLEEP APNEA 04/17/2010   does not use CPAP  . OSTEOARTHRITIS 05/25/2006  . Polyuria   . Pre-diabetes   . Psychosomatic disease 2011  . 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 T M J 05/11/2009    Past Surgical History:  Procedure Laterality Date  . CHOLECYSTECTOMY    . MASS EXCISION Left 10/06/2016   Procedure: EXCISION LEFT BUTTOCKS MASS;  Surgeon: Alphonsa Overall, MD;  Location: [place] [facility];  Service: General;  Laterality: Left;    Allergies  Allergen Reactions  . Morphine And Related Shortness Of Breath  . Bupropion Hcl Other (See Comments)    "does not feel right"  . Duloxetine Other (See Comments)    REACTION: did not feel good  . Ezetimibe Other (See Comments)    REACTION: legs burning  . Metformin And Related Diarrhea    Diarrhea w/XR or regular   . Trazodone Hcl Swelling  . Tramadol Hcl Palpitations    REACTION: palpitations    Family History  Problem Relation Age of Onset  . Heart attack Father   . Hypertension Brother   . Heart disease Mother   . Colon cancer Neg Hx   . Stomach cancer Neg Hx      Social History   Socioeconomic History  . Marital status: Married    Spouse name: Not on file  .  Number of children: 5  . Years of education: Not on file  . Highest education level: Not on file  Occupational History  . Occupation: house wife    Employer: UNEMPLOYED  Social Needs  . Financial resource strain: Not on file  . Food insecurity:    Worry: Not on file    Inability: Not on file  . Transportation needs:    Medical: Not on file    Non-medical: Not on file  Tobacco Use  . Smoking status: Current Some Day Smoker    Types: Cigarettes    Last attempt to quit: 07/10/2013    Years since quitting: 4.9  . Smokeless tobacco:  Current User    Types: Snuff  . Tobacco comment: smokes 2-4 cigs, maybe more when anxious  Substance and Sexual Activity  . Alcohol use: No    Alcohol/week: 0.0 standard drinks  . Drug use: No  . Sexual activity: Not Currently  Lifestyle  . Physical activity:    Days per week: Not on file    Minutes per session: Not on file  . Stress: Not on file  Relationships  . Social connections:    Talks on phone: Not on file    Gets together: Not on file    Attends religious service: Not on file    Active member of club or organization: Not on file    Attends meetings of clubs or organizations: Not on file    Relationship status: Not on file  . Intimate partner violence:    Fear of current or ex partner: Not on file    Emotionally abused: Not on file    Physically abused: Not on file    Forced sexual activity: Not on file  Other Topics Concern  . Not on file  Social History Narrative  . Not on file     PHYSICAL EXAM:  VS: BP 118/62   Pulse 68   Resp 16   Wt 199 lb (90.3 kg)   SpO2 98%   BMI 36.40 kg/m  Physical Exam Gen: NAD, alert, cooperative with exam,  ENT: normal lips, normal nasal mucosa,  Eye: normal EOM, normal conjunctiva and lids CV:  no edema, +2 pedal pulses   Resp: no accessory muscle use, non-labored,  Skin: no rashes, no areas of induration  Neuro: normal tone, normal sensation to touch Psych:  normal insight, alert and oriented MSK:  Back/ right hip: Some tenderness palpation of the right paraspinal muscles. Normal internal and external rotation of the hip. No tenderness palpation of the greater trochanter. Normal strength resistance with hip flexion, knee flexion extension, plantarflexion and dorsiflexion Negative straight leg raise on the right Neurovascular intact     ASSESSMENT & PLAN:   Sciatic leg pain Seems to have a component of sciatica down her right leg.  Does not appear to be associated with the joint. -Initiate prednisone. -Had  discussion about gabapentin.  Sit more and so she can have on hand. -Advised hold the diclofenac while on the prednisone.  She can continue taking her other pain medications and muscle relaxer if needed. -Counseled supportive care. -If no improvement may need to consider facet injections, epidural, or physical therapy.

## 2018-07-02 NOTE — Assessment & Plan Note (Signed)
Seems to have a component of sciatica down her right leg.  Does not appear to be associated with the joint. -Initiate prednisone. -Had discussion about gabapentin.  Sit more and so she can have on hand. -Advised hold the diclofenac while on the prednisone.  She can continue taking her other pain medications and muscle relaxer if needed. -Counseled supportive care. -If no improvement may need to consider facet injections, epidural, or physical therapy.

## 2018-07-02 NOTE — Patient Instructions (Signed)
Nice to meet you. Please try the exercises when he feels better. Please try ice or heat on the area. Please hold the diclofenac while you are taking the prednisone. Please try the gabapentin at night initially.  You can increase it 2 and 3 times daily as you tolerate. Please see me back in 2 to 3 weeks if you are not improving. Merry Christmas.

## 2018-07-08 ENCOUNTER — Encounter: Payer: Self-pay | Admitting: Family Medicine

## 2018-07-08 ENCOUNTER — Ambulatory Visit (INDEPENDENT_AMBULATORY_CARE_PROVIDER_SITE_OTHER): Payer: Medicare Other | Admitting: Family Medicine

## 2018-07-08 VITALS — BP 140/78 | HR 84 | Resp 16 | Wt 197.0 lb

## 2018-07-08 DIAGNOSIS — M5430 Sciatica, unspecified side: Secondary | ICD-10-CM | POA: Diagnosis not present

## 2018-07-08 MED ORDER — KETOROLAC TROMETHAMINE 60 MG/2ML IM SOLN
60.0000 mg | Freq: Once | INTRAMUSCULAR | Status: AC
  Administered 2018-07-08: 60 mg via INTRAMUSCULAR

## 2018-07-08 MED ORDER — NAPROXEN 500 MG PO TABS: 500.0000 mg | ORAL_TABLET | Freq: Two times a day (BID) | ORAL | 1 refills | Status: DC | PRN

## 2018-07-08 NOTE — Progress Notes (Signed)
Amanda Arnold - 72 y.o. female MRN [id number]  Date of birth: 01/04/1946  SUBJECTIVE:  Including CC & ROS.  No chief complaint on file.   Amanda Arnold is a 72 y.o. female that is following up for right sided leg pain.  She was seen on 12/20 with symptoms suggestive of sciatica.  She was prescribed prednisone and had improvement of her symptoms with that medicine.  Today those symptoms have returned.  They are severe in nature.  They are the same symptoms she was experiencing with pain down her left side of her leg.  She denies any saddle anesthesia or urinary incontinence.  Denies any inciting event or trauma    Review of Systems  Constitutional: Negative for fever.  HENT: Negative for congestion.   Respiratory: Negative for cough.   Cardiovascular: Negative for chest pain.  Gastrointestinal: Negative for abdominal pain.  Musculoskeletal: Positive for back pain and gait problem.  Skin: Negative for color change.  Neurological: Negative for weakness.  Hematological: Negative for adenopathy.    HISTORY: Past Medical, Surgical, Social, and Family History Reviewed & Updated per EMR.   Pertinent Historical Findings include:  Past Medical History:  Diagnosis Date  . Abdominal pain, epigastric 12/18/2009  . ANXIETY 05/25/2006  . BRONCHITIS, ACUTE 12/08/2007  . COPD 05/25/2006  . DEPRESSION 01/02/2006  . ESOTROPIA, LEFT EYE [birth year]  . FIBROMYALGIA 04/02/2006  . Headache(784.0) 12/08/2007  . HYPERGLYCEMIA 05/15/2010  . HYPERLIPIDEMIA 04/30/2006  . HYPERTENSION 05/25/2006  . Hypertension   . INSOMNIA, PERSISTENT 02/22/2008  . Lazy eye    left  . LOW BACK PAIN 01/27/2007  . Migraines   . 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 OBSTRUCTIVE SLEEP APNEA 04/17/2010   does not use CPAP  . OSTEOARTHRITIS 05/25/2006  . Polyuria   . Pre-diabetes   . Psychosomatic disease 2011  . 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 T M J 05/11/2009    Past Surgical History:  Procedure Laterality Date  .  CHOLECYSTECTOMY    . MASS EXCISION Left 10/06/2016   Procedure: EXCISION LEFT BUTTOCKS MASS;  Surgeon: Alphonsa Overall, MD;  Location: [facility];  Service: General;  Laterality: Left;    Allergies  Allergen Reactions  . Morphine And Related Shortness Of Breath  . Bupropion Hcl Other (See Comments)    "does not feel right"  . Duloxetine Other (See Comments)    REACTION: did not feel good  . Ezetimibe Other (See Comments)    REACTION: legs burning  . Metformin And Related Diarrhea    Diarrhea w/XR or regular   . Trazodone Hcl Swelling  . Tramadol Hcl Palpitations    REACTION: palpitations    Family History  Problem Relation Age of Onset  . Heart attack Father   . Hypertension Brother   . Heart disease Mother   . Colon cancer Neg Hx   . Stomach cancer Neg Hx      Social History   Socioeconomic History  . Marital status: Married    Spouse name: Not on file  . Number of children: 5  . Years of education: Not on file  . Highest education level: Not on file  Occupational History  . Occupation: house wife    Employer: UNEMPLOYED  Social Needs  . Financial resource strain: Not on file  . Food insecurity:    Worry: Not on file    Inability: Not on file  . Transportation needs:    Medical: Not on file  Non-medical: Not on file  Tobacco Use  . Smoking status: Current Some Day Smoker    Types: Cigarettes    Last attempt to quit: 07/10/2013    Years since quitting: 5.0  . Smokeless tobacco: Current User    Types: Snuff  . Tobacco comment: smokes 2-4 cigs, maybe more when anxious  Substance and Sexual Activity  . Alcohol use: No    Alcohol/week: 0.0 standard drinks  . Drug use: No  . Sexual activity: Not Currently  Lifestyle  . Physical activity:    Days per week: Not on file    Minutes per session: Not on file  . Stress: Not on file  Relationships  . Social connections:    Talks on phone: Not on file    Gets together: Not on file    Attends  religious service: Not on file    Active member of club or organization: Not on file    Attends meetings of clubs or organizations: Not on file    Relationship status: Not on file  . Intimate partner violence:    Fear of current or ex partner: Not on file    Emotionally abused: Not on file    Physically abused: Not on file    Forced sexual activity: Not on file  Other Topics Concern  . Not on file  Social History Narrative  . Not on file     PHYSICAL EXAM:  VS: BP 140/78   Pulse 84   Resp 16   Wt 197 lb (89.4 kg)   SpO2 98%   BMI 36.03 kg/m  Physical Exam Gen: NAD, alert, cooperative with exam, well-appearing ENT: normal lips, normal nasal mucosa,  Eye: normal EOM, normal conjunctiva and lids CV:  no edema, +2 pedal pulses   Resp: no accessory muscle use, non-labored,  Jumped to LOS skin: no rashes, no areas of induration  Neuro: normal tone, normal sensation to touch Psych:  normal insight, alert and oriented MSK:  Back/Right hip: Some tenderness to palpation of the right paraspinal lumbar muscle. Normal internal and external rotation of the hip. Normal strength resistance with hip flexion. Negative straight leg raise bilaterally. Neurovascular intact    ASSESSMENT & PLAN:   Sciatic leg pain Symptoms are returning.  It still appears to be sciatic in nature.  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 -Counseled on supportive care and home exercise therapy -If no improvement may need to consider physical therapy or an MRI to evaluate for nerve impingement with consideration of epidurals.

## 2018-07-08 NOTE — Patient Instructions (Signed)
Good to see you  Please start he naproxen tomorrow if you're still having pain Please try heat or ice on the area  Please try stretching and exercise  Please try massage over the area  Please see [place] back in 1-2 weeks if no better.  Happy holidays

## 2018-07-09 ENCOUNTER — Ambulatory Visit: Payer: Self-pay

## 2018-07-09 NOTE — Assessment & Plan Note (Signed)
Symptoms are returning.  It still appears to be sciatic in nature.  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 -Counseled on supportive care and home exercise therapy -If no improvement may need to consider physical therapy or an MRI to evaluate for nerve impingement with consideration of epidurals.

## 2018-07-09 NOTE — Telephone Encounter (Signed)
Pt. Reports she saw Dr. Raeford Razor yesterday. "The shot helped a little." Reports she was up and down all night with pain. Started the Naproxen today, but rates pain a "7" at a "10" when she tries to walk. Has also tried ice and heat. Her Norco helped "a little." Asking if something else can be sent to her pharmacy. Uses [facility] [employer].Please advise pt.  Answer Assessment - Initial Assessment Questions 1. LOCATION and RADIATION: "Where is the pain located?"      Right hip and down the leg 2. QUALITY: "What does the pain feel like?"  (e.g., sharp, dull, aching, burning)     Burning and aching  3. SEVERITY: "How bad is the pain?" "What does it keep you from doing?"   (Scale 1-10; or mild, moderate, severe)   -  MILD (1-3): doesn't interfere with normal activities    -  MODERATE (4-7): interferes with normal activities (e.g., work or school) or awakens from sleep, limping    -  SEVERE (8-10): excruciating pain, unable to do any normal activities, unable to walk     7 at rest  10 when walking 4. ONSET: "When did the pain start?" "Does it come and go, or is it there all the time?"     Saw provider yesterday 5. WORK OR EXERCISE: "Has there been any recent work or exercise that involved this part of the body?"      No 6. CAUSE: "What do you think is causing the hip pain?"      From her back 7. AGGRAVATING FACTORS: "What makes the hip pain worse?" (e.g., walking, climbing stairs, running)     Moving makes it worse. 8. OTHER SYMPTOMS: "Do you have any other symptoms?" (e.g., back pain, pain shooting down leg,  fever, rash)     Some weakness in leg.  Protocols used: HIP PAIN-A-AH

## 2018-07-12 ENCOUNTER — Ambulatory Visit (INDEPENDENT_AMBULATORY_CARE_PROVIDER_SITE_OTHER): Payer: Medicare Other | Admitting: Internal Medicine

## 2018-07-12 ENCOUNTER — Encounter: Payer: Self-pay | Admitting: Internal Medicine

## 2018-07-12 VITALS — BP 134/72 | HR 71 | Temp 98.3°F | Ht 62.0 in | Wt 194.0 lb

## 2018-07-12 DIAGNOSIS — F419 Anxiety disorder, unspecified: Secondary | ICD-10-CM

## 2018-07-12 DIAGNOSIS — F32A Depression, unspecified: Secondary | ICD-10-CM

## 2018-07-12 DIAGNOSIS — F329 Major depressive disorder, single episode, unspecified: Secondary | ICD-10-CM

## 2018-07-12 DIAGNOSIS — M7061 Trochanteric bursitis, right hip: Secondary | ICD-10-CM

## 2018-07-12 DIAGNOSIS — M5430 Sciatica, unspecified side: Secondary | ICD-10-CM

## 2018-07-12 MED ORDER — PREDNISONE 10 MG PO TABS: ORAL_TABLET | ORAL | 0 refills | Status: DC

## 2018-07-12 MED ORDER — METHYLPREDNISOLONE ACETATE 80 MG/ML IJ SUSP
80.0000 mg | Freq: Once | INTRAMUSCULAR | Status: AC
  Administered 2018-07-12: 80 mg via INTRA_ARTICULAR

## 2018-07-12 MED ORDER — HYDROCODONE-ACETAMINOPHEN 7.5-325 MG PO TABS: 1.0000 | ORAL_TABLET | Freq: Three times a day (TID) | ORAL | 0 refills | Status: DC | PRN

## 2018-07-12 NOTE — Patient Instructions (Signed)
Sciatica    Sciatica is pain, numbness, weakness, or tingling along your sciatic nerve. The sciatic nerve starts in the lower back and goes down the back of each leg. Sciatica happens when this nerve is pinched or has pressure put on it. Sciatica usually goes away on its own or with treatment. Sometimes, sciatica may keep coming back (recur).  Follow these instructions at home:  Medicines  · Take over-the-counter and prescription medicines only as told by your doctor.  · Do not drive or use heavy machinery while taking prescription pain medicine.  Managing pain  · If directed, put ice on the affected area.  ? Put ice in a plastic bag.  ? Place a towel between your skin and the bag.  ? Leave the ice on for 20 minutes, 2-3 times a day.  · After icing, apply heat to the affected area before you exercise or as often as told by your doctor. Use the heat source that your doctor tells you to use, such as a moist heat pack or a heating pad.  ? Place a towel between your skin and the heat source.  ? Leave the heat on for 20-30 minutes.  ? Remove the heat if your skin turns bright red. This is especially important if you are unable to feel pain, heat, or cold. You may have a greater risk of getting burned.  Activity  · Return to your normal activities as told by your doctor. Ask your doctor what activities are safe for you.  ? Avoid activities that make your sciatica worse.  · Take short rests during the day. Rest in a lying or standing position. This is usually better than sitting to rest.  ? When you rest for a long time, do some physical activity or stretching between periods of rest.  ? Avoid sitting for a long time without moving. Get up and move around at least one time each hour.  · Exercise and stretch regularly, as told by your doctor.  · Do not lift anything that is heavier than 10 lb (4.5 kg) while you have symptoms of sciatica.  ? Avoid lifting heavy things even when you do not have symptoms.  ? Avoid lifting  heavy things over and over.  · When you lift objects, always lift in a way that is safe for your body. To do this, you should:  ? Bend your knees.  ? Keep the object close to your body.  ? Avoid twisting.  General instructions  · Use good posture.  ? Avoid leaning forward when you are sitting.  ? Avoid hunching over when you are standing.  · Stay at a healthy weight.  · Wear comfortable shoes that support your feet. Avoid wearing high heels.  · Avoid sleeping on a mattress that is too soft or too hard. You might have less pain if you sleep on a mattress that is firm enough to support your back.  · Keep all follow-up visits as told by your doctor. This is important.  Contact a doctor if:  · You have pain that:  ? Wakes you up when you are sleeping.  ? Gets worse when you lie down.  ? Is worse than the pain you have had in the past.  ? Lasts longer than 4 weeks.  · You lose weight for without trying.  Get help right away if:  · You cannot control when you pee (urinate) or poop (have a bowel movement).  · You   have weakness in any of these areas and it gets worse.  ? Lower back.  ? Lower belly (pelvis).  ? Butt (buttocks).  ? Legs.  · You have redness or swelling of your back.  · You have a burning feeling when you pee.  This information is not intended to replace advice given to you by your health care provider. Make sure you discuss any questions you have with your health care provider.  Document Released: 04/08/2008 Document Revised: 12/06/2015 Document Reviewed: 03/09/2015  Elsevier Interactive Patient Education © 2019 [employer]

## 2018-07-12 NOTE — Assessment & Plan Note (Signed)
Norco prn 

## 2018-07-12 NOTE — Telephone Encounter (Signed)
Left VM for patient. If she calls back please have her speak with a nurse/CMA and ask how the pain is doing. We are limited in the pain medication we can given or try. She may need to go to the emergency room. The [facility] can report results to patient.   If any questions then please take the best time and phone number to call and I will try to call her back.   Rosemarie Ax, MD [facility] 07/12/2018, 5:19 PM

## 2018-07-12 NOTE — Assessment & Plan Note (Signed)
Chronic  Xanax prn  Potential benefits of a long term benzodiazepines  use as well as potential risks  and complications were explained to the patient and were aknowledged. 

## 2018-07-12 NOTE — Addendum Note (Signed)
Addended by: Karren Cobble on: 07/12/2018 11:00 AM   Modules accepted: Orders

## 2018-07-12 NOTE — Assessment & Plan Note (Signed)
See procedure 

## 2018-07-12 NOTE — Progress Notes (Signed)
Subjective:  Patient ID: Amanda Arnold, female    DOB: [birth year]  Age: 72 y.o. MRN: [id number]  CC: No chief complaint on file.   HPI ONESHA KREBBS presents for R flank pain irrad to the R buttock and the R thigh x 2 weeks. UTI was treated  Per Dr Raeford Razor: "Sciatic leg pain Symptoms are returning.  It still appears to be sciatic in nature.  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 -Counseled on supportive care and home exercise therapy -If no improvement may need to consider physical therapy or an MRI to evaluate for nerve impingement with consideration of epidurals."  Not better, can't take Naproxen...   Outpatient Medications Prior to Visit  Medication Sig Dispense Refill  . ALPRAZolam (XANAX) 0.5 MG tablet Take 1 tablet (0.5 mg total) by mouth 2 (two) times daily as needed for anxiety. 60 tablet 2  . Cholecalciferol (VITAMIN D3) 2000 UNITS capsule Take 1 capsule (2,000 Units total) by mouth daily. 100 capsule 3  . diazepam (VALIUM) 2 MG tablet Take 1 tablet (2 mg total) by mouth every 6 (six) hours as needed for muscle spasms. 15 tablet 0  . diclofenac (VOLTAREN) 75 MG EC tablet Take 1 tablet (75 mg total) by mouth 2 (two) times daily. 30 tablet 0  . fluconazole (DIFLUCAN) 100 MG tablet Take 2 tabs on day#1, then 1 tab daily on Days #2-10 11 tablet 1  . gabapentin (NEURONTIN) 300 MG capsule Take 1 capsule (300 mg total) by mouth 3 (three) times daily. 90 capsule 1  . HYDROcodone-acetaminophen (NORCO) 7.5-325 MG tablet Take 1 tablet by mouth every 8 (eight) hours as needed for severe pain. 90 tablet 0  . ketoconazole (NIZORAL) 2 % cream Apply 1 application topically 2 (two) times daily. In the groin 60 g 1  . meclizine (ANTIVERT) 12.5 MG tablet Take 1-2 tablets (12.5-25 mg total) by mouth 3 (three) times daily as needed for dizziness. 60 tablet 1  . metoprolol  succinate (TOPROL-XL) 25 MG 24 hr tablet Take 0.5 tablets (12.5 mg total) by mouth at bedtime. 45 tablet 3  . pramipexole (MIRAPEX) 0.125 MG tablet Take 1-2 tablets (0.125-0.25 mg total) by mouth at bedtime. 60 tablet 5  . umeclidinium-vilanterol (ANORO ELLIPTA) 62.5-25 MCG/INH AEPB Inhale 1 puff into the lungs daily. 1 each 11  . naproxen (NAPROSYN) 500 MG tablet Take 1 tablet (500 mg total) by mouth 2 (two) times daily as needed. (Patient not taking: Reported on 07/12/2018) 60 tablet 1   No facility-administered medications prior to visit.     ROS: Review of Systems  Constitutional: Negative for activity change, appetite change, chills, fatigue and unexpected weight change.  HENT: Negative for congestion, mouth sores and sinus pressure.   Eyes: Negative for visual disturbance.  Respiratory: Negative for cough and chest tightness.   Gastrointestinal: Negative for abdominal pain and nausea.  Genitourinary: Negative for difficulty urinating, frequency and vaginal pain.  Musculoskeletal: Positive for back pain and gait problem.  Skin: Negative for pallor and rash.  Neurological: Negative for dizziness, tremors, weakness, numbness and headaches.  Psychiatric/Behavioral: Negative for confusion and sleep disturbance.    Objective:  BP 134/72 (BP Location: Left Arm, Patient Position: Sitting, Cuff Size: Large)   Pulse 71   Temp 98.3 F (36.8 C) (Oral)   Ht 5\' 2"  (1.575 m)   Wt 194 lb (88 kg)   SpO2  98%   BMI 35.48 kg/m   BP Readings from Last 3 Encounters:  07/12/18 134/72  07/08/18 140/78  07/02/18 118/62    Wt Readings from Last 3 Encounters:  07/12/18 194 lb (88 kg)  07/08/18 197 lb (89.4 kg)  07/02/18 199 lb (90.3 kg)    Physical Exam Constitutional:      General: She is not in acute distress.    Appearance: She is well-developed.  HENT:     Head: Normocephalic.     Right Ear: External ear normal.     Left Ear: External ear normal.     Nose: Nose normal.  Eyes:      General:        Right eye: No discharge.        Left eye: No discharge.     Conjunctiva/sclera: Conjunctivae normal.     Pupils: Pupils are equal, round, and reactive to light.  Neck:     Musculoskeletal: Normal range of motion and neck supple.     Thyroid: No thyromegaly.     Vascular: No JVD.     Trachea: No tracheal deviation.  Cardiovascular:     Rate and Rhythm: Normal rate and regular rhythm.     Heart sounds: Normal heart sounds.  Pulmonary:     Effort: No respiratory distress.     Breath sounds: No stridor. No wheezing.  Abdominal:     General: Bowel sounds are normal. There is no distension.     Palpations: Abdomen is soft. There is no mass.     Tenderness: There is no abdominal tenderness. There is no guarding or rebound.  Musculoskeletal:        General: No tenderness.  Lymphadenopathy:     Cervical: No cervical adenopathy.  Skin:    Findings: No erythema or rash.  Neurological:     Mental Status: She is oriented to person, place, and time.     Cranial Nerves: No cranial nerve deficit.     Motor: No abnormal muscle tone.     Coordination: Coordination normal.     Gait: Gait abnormal.     Deep Tendon Reflexes: Reflexes normal.  Psychiatric:        Behavior: Behavior normal.        Thought Content: Thought content normal.        Judgment: Judgment normal.   in a w/c R hip is tender  R buttock is tender Str leg elev (+/-) R and (-) L    Procedure Note :     Procedure : Joint Injection,    hip   Indication:  Trochanteric bursitis with refractory  chronic pain.   Risks including unsuccessful procedure , bleeding, infection, bruising, skin atrophy, "steroid flare-up" and others were explained to the patient in detail as well as the benefits. Informed consent was obtained and signed.   Tthe patient was placed in a comfortable lateral decubitus position. The point of maximal tenderness was identified. Skin was prepped with Betadine and alcohol. Then, a 5 cc  syringe with a 2 inch long 24-gauge needle was used for a bursa injection.. The needle was advanced  Into the bursa. I injected the bursa with 4 mL of 2% lidocaine and 80 mg of Depo-Medrol .  Band-Aid was applied.   Tolerated well. Complications: None. Good pain relief following the procedure.   Postprocedure instructions :    A Band-Aid should be left on for 12 hours. Injection therapy is not a cure itself. It is  used in conjunction with other modalities. You can use nonsteroidal anti-inflammatories like ibuprofen , hot and cold compresses. Rest is recommended in the next 24 hours. You need to report immediately  if fever, chills or any signs of infection develop.    Lab Results  Component Value Date   WBC 8.5 02/08/2018   HGB 14.1 02/08/2018   HCT 44.3 02/08/2018   PLT 220 02/08/2018   GLUCOSE 120 (H) 05/25/2018   CHOL 167 09/17/2017   TRIG 92.0 09/17/2017   HDL 53.00 09/17/2017   LDLCALC 96 09/17/2017   ALT 30 09/17/2017   AST 31 09/17/2017   NA 140 05/25/2018   K 4.5 05/25/2018   CL 106 05/25/2018   CREATININE 0.70 05/25/2018   BUN 21 05/25/2018   CO2 26 05/25/2018   TSH 1.50 09/17/2017   HGBA1C 6.2 05/25/2018    Dg Lumbar Spine Complete  Result Date: 06/29/2018 CLINICAL DATA:  Increasing back pain over the past 3 days. No known injury. EXAM: LUMBAR SPINE - COMPLETE 4+ VIEW COMPARISON:  MRI lumbar spine dated February 03, 2017. FINDINGS: Five lumbar type vertebral bodies. No acute fracture or subluxation. Vertebral body heights are preserved. Unchanged trace anterolisthesis at L3-L4. Progressive moderate to severe disc height loss at L2-L3, L4-L5, and L5-S1. Prominent anterior endplate osteophytes throughout the visualized thoracolumbar spine. The sacroiliac joints are unremarkable. Calcified fibroid in the pelvis. Aortic atherosclerosis. IMPRESSION: 1.  No acute osseous abnormality. 2. Progressive multilevel moderate to severe lumbar spondylosis. Electronically Signed   By:  Titus Dubin M.D.   On: 06/29/2018 00:21   Dg Hip Unilat  With Pelvis 2-3 Views Right  Result Date: 06/28/2018 CLINICAL DATA:  Right hip pain since Sunday.  No known injury. EXAM: DG HIP (WITH OR WITHOUT PELVIS) 2-3V RIGHT COMPARISON:  CT abdomen pelvis dated February 09, 2016. FINDINGS: No acute fracture or dislocation. The hip joint spaces are preserved. The pubic symphysis and sacroiliac joints are unremarkable. Unchanged moderate lower lumbar degenerative disc disease. Unchanged calcified fibroid in the pelvis. Osteopenia. Soft tissues are unremarkable. IMPRESSION: 1.  No acute osseous abnormality. Electronically Signed   By: Titus Dubin M.D.   On: 06/28/2018 21:13    Assessment & Plan:   There are no diagnoses linked to this encounter.   No orders of the defined types were placed in this encounter.    Follow-up: No follow-ups on file.  Walker Kehr, MD

## 2018-07-26 ENCOUNTER — Ambulatory Visit: Payer: Medicare Other | Admitting: Internal Medicine

## 2018-07-27 ENCOUNTER — Other Ambulatory Visit: Payer: Self-pay | Admitting: Internal Medicine

## 2018-07-27 DIAGNOSIS — E119 Type 2 diabetes mellitus without complications: Secondary | ICD-10-CM

## 2018-07-27 DIAGNOSIS — R1031 Right lower quadrant pain: Secondary | ICD-10-CM

## 2018-08-03 DIAGNOSIS — L304 Erythema intertrigo: Secondary | ICD-10-CM | POA: Diagnosis not present

## 2018-08-10 ENCOUNTER — Encounter: Payer: Self-pay | Admitting: Internal Medicine

## 2018-08-10 ENCOUNTER — Ambulatory Visit (INDEPENDENT_AMBULATORY_CARE_PROVIDER_SITE_OTHER): Payer: Medicare Other | Admitting: Internal Medicine

## 2018-08-10 ENCOUNTER — Other Ambulatory Visit (INDEPENDENT_AMBULATORY_CARE_PROVIDER_SITE_OTHER): Payer: Medicare Other

## 2018-08-10 VITALS — BP 112/70 | HR 78 | Temp 98.4°F | Ht 62.0 in | Wt 195.0 lb

## 2018-08-10 DIAGNOSIS — G2581 Restless legs syndrome: Secondary | ICD-10-CM | POA: Diagnosis not present

## 2018-08-10 DIAGNOSIS — E119 Type 2 diabetes mellitus without complications: Secondary | ICD-10-CM | POA: Diagnosis not present

## 2018-08-10 DIAGNOSIS — R1031 Right lower quadrant pain: Secondary | ICD-10-CM

## 2018-08-10 DIAGNOSIS — G5711 Meralgia paresthetica, right lower limb: Secondary | ICD-10-CM | POA: Diagnosis not present

## 2018-08-10 DIAGNOSIS — M5430 Sciatica, unspecified side: Secondary | ICD-10-CM

## 2018-08-10 LAB — CBC WITH DIFFERENTIAL/PLATELET
Basophils Absolute: 0.1 10*3/uL (ref 0.0–0.1)
Basophils Relative: 0.8 % (ref 0.0–3.0)
Eosinophils Absolute: 0.1 10*3/uL (ref 0.0–0.7)
Eosinophils Relative: 1.7 % (ref 0.0–5.0)
HCT: 40.8 % (ref 36.0–46.0)
Hemoglobin: 13.6 g/dL (ref 12.0–15.0)
Lymphocytes Relative: 36.6 % (ref 12.0–46.0)
Lymphs Abs: 2.5 10*3/uL (ref 0.7–4.0)
MCHC: 33.3 g/dL (ref 30.0–36.0)
MCV: 94 fl (ref 78.0–100.0)
Monocytes Absolute: 0.5 10*3/uL (ref 0.1–1.0)
Monocytes Relative: 7.5 % (ref 3.0–12.0)
Neutro Abs: 3.6 10*3/uL (ref 1.4–7.7)
Neutrophils Relative %: 53.4 % (ref 43.0–77.0)
Platelets: 190 10*3/uL (ref 150.0–400.0)
RBC: 4.34 Mil/uL (ref 3.87–5.11)
RDW: 13.3 % (ref 11.5–15.5)
WBC: 6.8 10*3/uL (ref 4.0–10.5)

## 2018-08-10 LAB — BASIC METABOLIC PANEL
BUN: 18 mg/dL (ref 6–23)
CO2: 27 mEq/L (ref 19–32)
Calcium: 10.1 mg/dL (ref 8.4–10.5)
Chloride: 105 mEq/L (ref 96–112)
Creatinine, Ser: 0.69 mg/dL (ref 0.40–1.20)
GFR: 84.04 mL/min (ref >60.00)
Glucose, Bld: 119 mg/dL — ABNORMAL HIGH (ref 70–99)
Potassium: 4.4 mEq/L (ref 3.5–5.1)
Sodium: 141 mEq/L (ref 135–145)

## 2018-08-10 LAB — HEPATIC FUNCTION PANEL
ALT: 28 U/L (ref 0–35)
AST: 25 U/L (ref 0–37)
Albumin: 4.2 g/dL (ref 3.5–5.2)
Alkaline Phosphatase: 67 U/L (ref 39–117)
Bilirubin, Direct: 0.1 mg/dL (ref 0.0–0.3)
Total Bilirubin: 0.7 mg/dL (ref 0.2–1.2)
Total Protein: 6.5 g/dL (ref 6.0–8.3)

## 2018-08-10 LAB — GLUCOSE, POCT (MANUAL RESULT ENTRY): POC Glucose: 131 mg/dl — AB (ref 70–99)

## 2018-08-10 NOTE — Assessment & Plan Note (Signed)
CBG 131 Labs

## 2018-08-10 NOTE — Assessment & Plan Note (Addendum)
Recurrent ?etiology Labs, UA

## 2018-08-10 NOTE — Progress Notes (Signed)
Subjective:  Patient ID: Amanda Arnold, female    DOB: [birth year]  Age: 73 y.o. MRN: [id number]  CC: No chief complaint on file.   HPI Amanda Arnold presents for R anterior thigh burning, LBP, RUQ/ribs pain w/breathing  Outpatient Medications Prior to Visit  Medication Sig Dispense Refill  . ALPRAZolam (XANAX) 0.5 MG tablet TAKE 1 TABLET(0.5 MG) BY MOUTH TWICE DAILY AS NEEDED FOR ANXIETY 60 tablet 2  . Cholecalciferol (VITAMIN D3) 2000 UNITS capsule Take 1 capsule (2,000 Units total) by mouth daily. 100 capsule 3  . diazepam (VALIUM) 2 MG tablet Take 1 tablet (2 mg total) by mouth every 6 (six) hours as needed for muscle spasms. 15 tablet 0  . fluconazole (DIFLUCAN) 100 MG tablet Take 2 tabs on day#1, then 1 tab daily on Days #2-10 11 tablet 1  . gabapentin (NEURONTIN) 300 MG capsule Take 1 capsule (300 mg total) by mouth 3 (three) times daily. 90 capsule 1  . HYDROcodone-acetaminophen (NORCO) 7.5-325 MG tablet Take 1 tablet by mouth every 8 (eight) hours as needed for severe pain. 90 tablet 0  . ketoconazole (NIZORAL) 2 % cream Apply 1 application topically 2 (two) times daily. In the groin 60 g 1  . meclizine (ANTIVERT) 12.5 MG tablet Take 1-2 tablets (12.5-25 mg total) by mouth 3 (three) times daily as needed for dizziness. 60 tablet 1  . metoprolol succinate (TOPROL-XL) 25 MG 24 hr tablet Take 0.5 tablets (12.5 mg total) by mouth at bedtime. 45 tablet 3  . pramipexole (MIRAPEX) 0.125 MG tablet Take 1-2 tablets (0.125-0.25 mg total) by mouth at bedtime. 60 tablet 5  . predniSONE (DELTASONE) 10 MG tablet Prednisone 10 mg: take 4 tabs a day x 3 days; then 3 tabs a day x 4 days; then 2 tabs a day x 4 days, then 1 tab a day x 6 days, then stop. Take pc. 38 tablet 0  . umeclidinium-vilanterol (ANORO ELLIPTA) 62.5-25 MCG/INH AEPB Inhale 1 puff into the lungs daily. 1 each 11   No facility-administered medications prior to visit.     ROS: Review of Systems  Constitutional: Positive  for fatigue. Negative for activity change, appetite change, chills and unexpected weight change.  HENT: Negative for congestion, mouth sores and sinus pressure.   Eyes: Negative for visual disturbance.  Respiratory: Negative for cough and chest tightness.   Cardiovascular: Positive for chest pain.  Gastrointestinal: Positive for abdominal pain. Negative for nausea.  Genitourinary: Negative for difficulty urinating, frequency and vaginal pain.  Musculoskeletal: Positive for arthralgias, back pain and gait problem.  Skin: Negative for pallor and rash.  Neurological: Negative for dizziness, tremors, weakness, numbness and headaches.  Psychiatric/Behavioral: Negative for confusion, sleep disturbance and suicidal ideas. The patient is nervous/anxious.     Objective:  BP 112/70 (BP Location: Left Arm, Patient Position: Sitting, Cuff Size: Large)   Pulse 78   Temp 98.4 F (36.9 C) (Oral)   Ht 5\' 2"  (1.575 m)   Wt 195 lb (88.5 kg)   SpO2 97%   BMI 35.67 kg/m   BP Readings from Last 3 Encounters:  08/10/18 112/70  07/12/18 134/72  07/08/18 140/78    Wt Readings from Last 3 Encounters:  08/10/18 195 lb (88.5 kg)  07/12/18 194 lb (88 kg)  07/08/18 197 lb (89.4 kg)    Physical Exam Constitutional:      General: She is not in acute distress.    Appearance: She is well-developed.  HENT:  Head: Normocephalic.     Right Ear: External ear normal.     Left Ear: External ear normal.     Nose: Nose normal.  Eyes:     General:        Right eye: No discharge.        Left eye: No discharge.     Conjunctiva/sclera: Conjunctivae normal.     Pupils: Pupils are equal, round, and reactive to light.  Neck:     Musculoskeletal: Normal range of motion and neck supple.     Thyroid: No thyromegaly.     Vascular: No JVD.     Trachea: No tracheal deviation.  Cardiovascular:     Rate and Rhythm: Normal rate and regular rhythm.     Heart sounds: Normal heart sounds.  Pulmonary:     Effort:  No respiratory distress.     Breath sounds: No stridor. No wheezing.  Abdominal:     General: Bowel sounds are normal. There is no distension.     Palpations: Abdomen is soft. There is no mass.     Tenderness: There is abdominal tenderness. There is no guarding or rebound.  Musculoskeletal:        General: Tenderness present.  Lymphadenopathy:     Cervical: No cervical adenopathy.  Skin:    Findings: No erythema or rash.  Neurological:     Cranial Nerves: No cranial nerve deficit.     Motor: No abnormal muscle tone.     Coordination: Coordination normal.     Deep Tendon Reflexes: Reflexes normal.  Psychiatric:        Behavior: Behavior normal.        Thought Content: Thought content normal.        Judgment: Judgment normal.    RUQ is sensitive R ant thigh - painful   Lab Results  Component Value Date   WBC 8.5 02/08/2018   HGB 14.1 02/08/2018   HCT 44.3 02/08/2018   PLT 220 02/08/2018   GLUCOSE 120 (H) 05/25/2018   CHOL 167 09/17/2017   TRIG 92.0 09/17/2017   HDL 53.00 09/17/2017   LDLCALC 96 09/17/2017   ALT 30 09/17/2017   AST 31 09/17/2017   NA 140 05/25/2018   K 4.5 05/25/2018   CL 106 05/25/2018   CREATININE 0.70 05/25/2018   BUN 21 05/25/2018   CO2 26 05/25/2018   TSH 1.50 09/17/2017   HGBA1C 6.2 05/25/2018    Dg Lumbar Spine Complete  Result Date: 06/29/2018 CLINICAL DATA:  Increasing back pain over the past 3 days. No known injury. EXAM: LUMBAR SPINE - COMPLETE 4+ VIEW COMPARISON:  MRI lumbar spine dated February 03, 2017. FINDINGS: Five lumbar type vertebral bodies. No acute fracture or subluxation. Vertebral body heights are preserved. Unchanged trace anterolisthesis at L3-L4. Progressive moderate to severe disc height loss at L2-L3, L4-L5, and L5-S1. Prominent anterior endplate osteophytes throughout the visualized thoracolumbar spine. The sacroiliac joints are unremarkable. Calcified fibroid in the pelvis. Aortic atherosclerosis. IMPRESSION: 1.  No acute  osseous abnormality. 2. Progressive multilevel moderate to severe lumbar spondylosis. Electronically Signed   By: Titus Dubin M.D.   On: 06/29/2018 00:21   Dg Hip Unilat  With Pelvis 2-3 Views Right  Result Date: 06/28/2018 CLINICAL DATA:  Right hip pain since Sunday.  No known injury. EXAM: DG HIP (WITH OR WITHOUT PELVIS) 2-3V RIGHT COMPARISON:  CT abdomen pelvis dated February 09, 2016. FINDINGS: No acute fracture or dislocation. The hip joint spaces are preserved. The pubic  symphysis and sacroiliac joints are unremarkable. Unchanged moderate lower lumbar degenerative disc disease. Unchanged calcified fibroid in the pelvis. Osteopenia. Soft tissues are unremarkable. IMPRESSION: 1.  No acute osseous abnormality. Electronically Signed   By: Titus Dubin M.D.   On: 06/28/2018 21:13    Assessment & Plan:   Diagnoses and all orders for this visit:  Controlled type 2 diabetes mellitus without complication, without long-term current use of insulin (HCC) -     POCT Glucose (CBG)     No orders of the defined types were placed in this encounter.    Follow-up: No follow-ups on file.  Walker Kehr, MD

## 2018-08-10 NOTE — Patient Instructions (Addendum)
Weighted blanket  Go to ER if worse  Cardiac CT calcium scoring test $150   Computed tomography, more commonly known as a CT or CAT scan, is a diagnostic medical imaging test. Like traditional x-rays, it produces multiple images or pictures of the inside of the body. The cross-sectional images generated during a CT scan can be reformatted in multiple planes. They can even generate three-dimensional images. These images can be viewed on a computer monitor, printed on film or by a 3D printer, or transferred to a CD or DVD. CT images of internal organs, bones, soft tissue and blood vessels provide greater detail than traditional x-rays, particularly of soft tissues and blood vessels. A cardiac CT scan for coronary calcium is a non-invasive way of obtaining information about the presence, location and extent of calcified plaque in the coronary arteries-the vessels that supply oxygen-containing blood to the heart muscle. Calcified plaque results when there is a build-up of fat and other substances under the inner layer of the artery. This material can calcify which signals the presence of atherosclerosis, a disease of the vessel wall, also called coronary artery disease (CAD). People with this disease have an increased risk for heart attacks. In addition, over time, progression of plaque build up (CAD) can narrow the arteries or even close off blood flow to the heart. The result may be chest pain, sometimes called "angina," or a heart attack. Because calcium is a marker of CAD, the amount of calcium detected on a cardiac CT scan is a helpful prognostic tool. The findings on cardiac CT are expressed as a calcium score. Another name for this test is coronary artery calcium scoring.  What are some common uses of the procedure? The goal of cardiac CT scan for calcium scoring is to determine if CAD is present and to what extent, even if there are no symptoms. It is a screening study that may be recommended by a  physician for patients with risk factors for CAD but no clinical symptoms. The major risk factors for CAD are: . high blood cholesterol levels  . family history of heart attacks  . diabetes  . high blood pressure  . cigarette smoking  . overweight or obese  . physical inactivity   A negative cardiac CT scan for calcium scoring shows no calcification within the coronary arteries. This suggests that CAD is absent or so minimal it cannot be seen by this technique. The chance of having a heart attack over the next two to five years is very low under these circumstances. A positive test means that CAD is present, regardless of whether or not the patient is experiencing any symptoms. The amount of calcification-expressed as the calcium score-may help to predict the likelihood of a myocardial infarction (heart attack) in the coming years and helps your medical doctor or cardiologist decide whether the patient may need to take preventive medicine or undertake other measures such as diet and exercise to lower the risk for heart attack. The extent of CAD is graded according to your calcium score:  Calcium Score  Presence of CAD  0 No evidence of CAD   1-10 Minimal evidence of CAD  11-100 Mild evidence of CAD  101-400 Moderate evidence of CAD  Over 400 Extensive evidence of CAD

## 2018-08-10 NOTE — Assessment & Plan Note (Signed)
Gabapentin - low dose Norco prn  Potential benefits of a long term opioids use as well as potential risks (i.e. addiction risk, apnea etc) and complications (i.e. Somnolence, constipation and others) were explained to the patient and were aknowledged.

## 2018-08-10 NOTE — Assessment & Plan Note (Addendum)
Chronic R leg pain Norco prn  Potential benefits of a long term opioids use as well as potential risks (i.e. addiction risk, apnea etc) and complications (i.e. Somnolence, constipation and others) were explained to the patient and were aknowledged.

## 2018-08-10 NOTE — Assessment & Plan Note (Signed)
Weighted blanket 

## 2018-08-11 ENCOUNTER — Other Ambulatory Visit (INDEPENDENT_AMBULATORY_CARE_PROVIDER_SITE_OTHER): Payer: Medicare Other

## 2018-08-11 DIAGNOSIS — E119 Type 2 diabetes mellitus without complications: Secondary | ICD-10-CM

## 2018-08-11 DIAGNOSIS — R1031 Right lower quadrant pain: Secondary | ICD-10-CM | POA: Diagnosis not present

## 2018-08-11 LAB — D-DIMER, QUANTITATIVE: D-Dimer, Quant: 0.51 mcg/mL FEU — ABNORMAL HIGH (ref <0.50)

## 2018-08-12 LAB — URINALYSIS
Bilirubin Urine: NEGATIVE
Hgb urine dipstick: NEGATIVE
Ketones, ur: NEGATIVE
Leukocytes, UA: NEGATIVE
Nitrite: NEGATIVE
Specific Gravity, Urine: 1.025 (ref 1.000–1.030)
Total Protein, Urine: NEGATIVE
Urine Glucose: NEGATIVE
Urobilinogen, UA: 0.2 (ref 0.0–1.0)
pH: 5.5 (ref 5.0–8.0)

## 2018-09-01 ENCOUNTER — Ambulatory Visit (INDEPENDENT_AMBULATORY_CARE_PROVIDER_SITE_OTHER): Payer: Medicare Other | Admitting: Internal Medicine

## 2018-09-01 ENCOUNTER — Encounter: Payer: Self-pay | Admitting: Internal Medicine

## 2018-09-01 VITALS — BP 118/66 | HR 59 | Temp 98.0°F | Ht 62.0 in | Wt 196.0 lb

## 2018-09-01 DIAGNOSIS — F4541 Pain disorder exclusively related to psychological factors: Secondary | ICD-10-CM

## 2018-09-01 DIAGNOSIS — E785 Hyperlipidemia, unspecified: Secondary | ICD-10-CM | POA: Diagnosis not present

## 2018-09-01 DIAGNOSIS — M5430 Sciatica, unspecified side: Secondary | ICD-10-CM

## 2018-09-01 DIAGNOSIS — G2581 Restless legs syndrome: Secondary | ICD-10-CM | POA: Diagnosis not present

## 2018-09-01 MED ORDER — HYDROCODONE-ACETAMINOPHEN 7.5-325 MG PO TABS: 1.0000 | ORAL_TABLET | Freq: Three times a day (TID) | ORAL | 0 refills | Status: DC | PRN

## 2018-09-01 NOTE — Progress Notes (Signed)
Subjective:  Patient ID: Amanda Arnold, female    DOB: 08/07/1945  Age: 73 y.o. MRN: [id number]  CC: No chief complaint on file.   HPI [facility] presents for LBP, R leg pain - better F/u anxiety, RLS  - weighted blanket helped  Outpatient Medications Prior to Visit  Medication Sig Dispense Refill  . ALPRAZolam (XANAX) 0.5 MG tablet TAKE 1 TABLET(0.5 MG) BY MOUTH TWICE DAILY AS NEEDED FOR ANXIETY 60 tablet 2  . Cholecalciferol (VITAMIN D3) 2000 UNITS capsule Take 1 capsule (2,000 Units total) by mouth daily. 100 capsule 3  . diazepam (VALIUM) 2 MG tablet Take 1 tablet (2 mg total) by mouth every 6 (six) hours as needed for muscle spasms. 15 tablet 0  . fluconazole (DIFLUCAN) 100 MG tablet Take 2 tabs on day#1, then 1 tab daily on Days #2-10 11 tablet 1  . gabapentin (NEURONTIN) 300 MG capsule Take 1 capsule (300 mg total) by mouth 3 (three) times daily. 90 capsule 1  . HYDROcodone-acetaminophen (NORCO) 7.5-325 MG tablet Take 1 tablet by mouth every 8 (eight) hours as needed for severe pain. 90 tablet 0  . ketoconazole (NIZORAL) 2 % cream Apply 1 application topically 2 (two) times daily. In the groin 60 g 1  . meclizine (ANTIVERT) 12.5 MG tablet Take 1-2 tablets (12.5-25 mg total) by mouth 3 (three) times daily as needed for dizziness. 60 tablet 1  . metoprolol succinate (TOPROL-XL) 25 MG 24 hr tablet Take 0.5 tablets (12.5 mg total) by mouth at bedtime. 45 tablet 3  . pramipexole (MIRAPEX) 0.125 MG tablet Take 1-2 tablets (0.125-0.25 mg total) by mouth at bedtime. 60 tablet 5  . predniSONE (DELTASONE) 10 MG tablet Prednisone 10 mg: take 4 tabs a day x 3 days; then 3 tabs a day x 4 days; then 2 tabs a day x 4 days, then 1 tab a day x 6 days, then stop. Take pc. 38 tablet 0  . umeclidinium-vilanterol (ANORO ELLIPTA) 62.5-25 MCG/INH AEPB Inhale 1 puff into the lungs daily. 1 each 11   No facility-administered medications prior to visit.     ROS: Review of Systems    Constitutional: Negative for activity change, appetite change, chills, fatigue and unexpected weight change.  HENT: Negative for congestion, mouth sores and sinus pressure.   Eyes: Negative for visual disturbance.  Respiratory: Negative for cough and chest tightness.   Gastrointestinal: Negative for abdominal pain and nausea.  Genitourinary: Negative for difficulty urinating, frequency and vaginal pain.  Musculoskeletal: Positive for arthralgias, back pain and gait problem.  Skin: Negative for pallor, rash and wound.  Neurological: Negative for dizziness, tremors, weakness, numbness and headaches.  Psychiatric/Behavioral: Negative for confusion and sleep disturbance.    Objective:  BP 118/66 (BP Location: Left Arm, Patient Position: Sitting, Cuff Size: Large)   Pulse (!) 59   Temp 98 F (36.7 C) (Oral)   Ht 5\' 2"  (1.575 m)   Wt 196 lb (88.9 kg)   SpO2 98%   BMI 35.85 kg/m   BP Readings from Last 3 Encounters:  09/01/18 118/66  08/10/18 112/70  07/12/18 134/72    Wt Readings from Last 3 Encounters:  09/01/18 196 lb (88.9 kg)  08/10/18 195 lb (88.5 kg)  07/12/18 194 lb (88 kg)    Physical Exam Constitutional:      General: She is not in acute distress.    Appearance: She is well-developed.  HENT:     Head: Normocephalic.  Right Ear: External ear normal.     Left Ear: External ear normal.     Nose: Nose normal.  Eyes:     General:        Right eye: No discharge.        Left eye: No discharge.     Conjunctiva/sclera: Conjunctivae normal.     Pupils: Pupils are equal, round, and reactive to light.  Neck:     Musculoskeletal: Normal range of motion and neck supple.     Thyroid: No thyromegaly.     Vascular: No JVD.     Trachea: No tracheal deviation.  Cardiovascular:     Rate and Rhythm: Normal rate and regular rhythm.     Heart sounds: Normal heart sounds.  Pulmonary:     Effort: No respiratory distress.     Breath sounds: No stridor. No wheezing.   Abdominal:     General: Bowel sounds are normal. There is no distension.     Palpations: Abdomen is soft. There is no mass.     Tenderness: There is no abdominal tenderness. There is no guarding or rebound.  Musculoskeletal:        General: No tenderness.  Lymphadenopathy:     Cervical: No cervical adenopathy.  Skin:    Findings: No erythema or rash.  Neurological:     Cranial Nerves: No cranial nerve deficit.     Motor: No abnormal muscle tone.     Coordination: Coordination normal.     Deep Tendon Reflexes: Reflexes normal.  Psychiatric:        Behavior: Behavior normal.        Thought Content: Thought content normal.        Judgment: Judgment normal.   Str leg elevation (-/+) R Leg NT  Lab Results  Component Value Date   WBC 6.8 08/10/2018   HGB 13.6 08/10/2018   HCT 40.8 08/10/2018   PLT 190.0 08/10/2018   GLUCOSE 119 (H) 08/10/2018   CHOL 167 09/17/2017   TRIG 92.0 09/17/2017   HDL 53.00 09/17/2017   LDLCALC 96 09/17/2017   ALT 28 08/10/2018   AST 25 08/10/2018   NA 141 08/10/2018   K 4.4 08/10/2018   CL 105 08/10/2018   CREATININE 0.69 08/10/2018   BUN 18 08/10/2018   CO2 27 08/10/2018   TSH 1.50 09/17/2017   HGBA1C 6.2 05/25/2018    Dg Lumbar Spine Complete  Result Date: 06/29/2018 CLINICAL DATA:  Increasing back pain over the past 3 days. No known injury. EXAM: LUMBAR SPINE - COMPLETE 4+ VIEW COMPARISON:  MRI lumbar spine dated February 03, 2017. FINDINGS: Five lumbar type vertebral bodies. No acute fracture or subluxation. Vertebral body heights are preserved. Unchanged trace anterolisthesis at L3-L4. Progressive moderate to severe disc height loss at L2-L3, L4-L5, and L5-S1. Prominent anterior endplate osteophytes throughout the visualized thoracolumbar spine. The sacroiliac joints are unremarkable. Calcified fibroid in the pelvis. Aortic atherosclerosis. IMPRESSION: 1.  No acute osseous abnormality. 2. Progressive multilevel moderate to severe lumbar  spondylosis. Electronically Signed   By: Titus Dubin M.D.   On: 06/29/2018 00:21   Dg Hip Unilat  With Pelvis 2-3 Views Right  Result Date: 06/28/2018 CLINICAL DATA:  Right hip pain since Sunday.  No known injury. EXAM: DG HIP (WITH OR WITHOUT PELVIS) 2-3V RIGHT COMPARISON:  CT abdomen pelvis dated February 09, 2016. FINDINGS: No acute fracture or dislocation. The hip joint spaces are preserved. The pubic symphysis and sacroiliac joints are unremarkable. Unchanged moderate  lower lumbar degenerative disc disease. Unchanged calcified fibroid in the pelvis. Osteopenia. Soft tissues are unremarkable. IMPRESSION: 1.  No acute osseous abnormality. Electronically Signed   By: Titus Dubin M.D.   On: 06/28/2018 21:13    Assessment & Plan:   There are no diagnoses linked to this encounter.   No orders of the defined types were placed in this encounter.    Follow-up: No follow-ups on file.  Walker Kehr, MD

## 2018-09-01 NOTE — Assessment & Plan Note (Signed)
weighted blanket helped

## 2018-09-01 NOTE — Assessment & Plan Note (Signed)
RLS  - weighted blanket helped

## 2018-09-03 ENCOUNTER — Ambulatory Visit (HOSPITAL_COMMUNITY)
Admission: RE | Admit: 2018-09-03 | Discharge: 2018-09-03 | Disposition: A | Discharge status: Home / Self Care | Payer: Medicare Other | Source: Ambulatory Visit | Attending: Cardiovascular Disease | Admitting: Cardiovascular Disease

## 2018-09-03 ENCOUNTER — Encounter (HOSPITAL_COMMUNITY): Payer: Self-pay

## 2018-09-03 DIAGNOSIS — M5430 Sciatica, unspecified side: Secondary | ICD-10-CM | POA: Diagnosis not present

## 2018-09-03 DIAGNOSIS — M5431 Sciatica, right side: Secondary | ICD-10-CM

## 2018-09-03 NOTE — Progress Notes (Signed)
Right lower venous has been completed and is negative for DVT. Preliminary results can be found under CV proc through chart review.  Acen Craun [facility]  

## 2018-09-08 ENCOUNTER — Other Ambulatory Visit: Payer: Self-pay | Admitting: Internal Medicine

## 2018-09-08 DIAGNOSIS — Z1231 Encounter for screening mammogram for malignant neoplasm of breast: Secondary | ICD-10-CM

## 2018-10-06 ENCOUNTER — Inpatient Hospital Stay: Admission: RE | Admit: 2018-10-06 | Payer: Medicare Other | Source: Ambulatory Visit

## 2018-10-19 ENCOUNTER — Ambulatory Visit: Payer: Medicare Other

## 2018-10-27 NOTE — Progress Notes (Addendum)
Subjective:   Amanda Arnold is a 73 y.o. female who presents for Medicare Annual (Subsequent) preventive examination.  Review of Systems:  No ROS.  Medicare Wellness Visit. Additional risk factors are reflected in the social history.  Cardiac Risk Factors include: advanced age (>21men, >15 women);diabetes mellitus;dyslipidemia;hypertension;obesity (BMI >30kg/m2) Sleep patterns: has interrupted sleep, gets up 2-3 times nightly to void and sleeps 5-6 hours nightly. Patient reports insomnia issues, discussed recommended sleep tips.   Home Safety/Smoke Alarms: Feels safe in home. Smoke alarms in place.  Living environment; residence and Firearm Safety: 1-story house/ trailer, equipment: Hydrologist, Type: Tub Surveyor, quantity. Lives with husband, no needs for DME, good support system Seat Belt Safety/Bike Helmet: Wears seat belt.      Objective:     Vitals: BP 124/68   Pulse 65   Temp 98.2 F (36.8 C)   Resp 17   Ht 5\' 2"  (1.575 m)   Wt 197 lb (89.4 kg)   SpO2 97%   BMI 36.03 kg/m   Body mass index is 36.03 kg/m.  Advanced Directives 10/28/2018 06/28/2018 02/08/2018 10/01/2016 09/26/2016 02/09/2016 07/03/2015  Does Patient Have a Medical Advance Directive? No No No No No No No  Does patient want to make changes to medical advance directive? - - - - No - Patient declined - -  Would patient like information on creating a medical advance directive? No - Patient declined No - Patient declined - - - No - patient declined information -    Tobacco Social History   Tobacco Use  Smoking Status Current Some Day Smoker  . Types: Cigarettes  . Last attempt to quit: 07/10/2013  . Years since quitting: 5.3  Smokeless Tobacco Current User  . Types: Snuff  Tobacco Comment   smokes 2-4 cigs, maybe more when anxious     Ready to quit: Not Answered Counseling given: Not Answered Comment: smokes 2-4 cigs, maybe more when anxious  Past Medical History:  Diagnosis Date  . Abdominal pain,  epigastric 12/18/2009  . ANXIETY 05/25/2006  . BRONCHITIS, ACUTE 12/08/2007  . COPD 05/25/2006  . DEPRESSION 01/02/2006  . ESOTROPIA, LEFT EYE 03-10[birth year]48  . FIBROMYALGIA 04/02/2006  . Headache(784.0) 12/08/2007  . HYPERGLYCEMIA 05/15/2010  . HYPERLIPIDEMIA 04/30/2006  . HYPERTENSION 05/25/2006  . Hypertension   . INSOMNIA, PERSISTENT 02/22/2008  . Lazy eye    left  . LOW BACK PAIN 01/27/2007  . Migraines   . 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 OBSTRUCTIVE SLEEP APNEA 04/17/2010   does not use CPAP  . OSTEOARTHRITIS 05/25/2006  . Polyuria   . Pre-diabetes   . Psychosomatic disease 2011  . 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 T M J 05/11/2009   Past Surgical History:  Procedure Laterality Date  . CHOLECYSTECTOMY    . MASS EXCISION Left 10/06/2016   Procedure: EXCISION LEFT BUTTOCKS MASS;  Surgeon: Alphonsa Overall, MD;  Location: [facility];  Service: General;  Laterality: Left;   Family History  Problem Relation Age of Onset  . Heart attack Father   . Hypertension Brother   . Heart disease Mother   . Colon cancer Neg Hx   . Stomach cancer Neg Hx    Social History   Socioeconomic History  . Marital status: Married    Spouse name: Not on file  . Number of children: 5  . Years of education: Not on file  . Highest education level: Not on file  Occupational History  . Occupation: house wife  Employer: UNEMPLOYED  Social Needs  . Financial resource strain: Not hard at all  . Food insecurity:    Worry: Never true    Inability: Never true  . Transportation needs:    Medical: No    Non-medical: No  Tobacco Use  . Smoking status: Current Some Day Smoker    Types: Cigarettes    Last attempt to quit: 07/10/2013    Years since quitting: 5.3  . Smokeless tobacco: Current User    Types: Snuff  . Tobacco comment: smokes 2-4 cigs, maybe more when anxious  Substance and Sexual Activity  . Alcohol use: No    Alcohol/week: 0.0 standard drinks  . Drug use: No  .  Sexual activity: Not Currently  Lifestyle  . Physical activity:    Days per week: 5 days    Minutes per session: 30 min  . Stress: Only a little  Relationships  . Social connections:    Talks on phone: More than three times a week    Gets together: More than three times a week    Attends religious service: 1 to 4 times per year    Active member of club or organization: Yes    Attends meetings of clubs or organizations: 1 to 4 times per year    Relationship status: Married  Other Topics Concern  . Not on file  Social History Narrative  . Not on file    Outpatient Encounter Medications as of 10/28/2018  Medication Sig  . ALPRAZolam (XANAX) 0.5 MG tablet TAKE 1 TABLET(0.5 MG) BY MOUTH TWICE DAILY AS NEEDED FOR ANXIETY  . Cholecalciferol (VITAMIN D3) 2000 UNITS capsule Take 1 capsule (2,000 Units total) by mouth daily.  . diazepam (VALIUM) 2 MG tablet Take 1 tablet (2 mg total) by mouth every 6 (six) hours as needed for muscle spasms.  Marland Kitchen gabapentin (NEURONTIN) 300 MG capsule Take 1 capsule (300 mg total) by mouth 3 (three) times daily.  Marland Kitchen HYDROcodone-acetaminophen (NORCO) 7.5-325 MG tablet Take 1 tablet by mouth every 8 (eight) hours as needed for severe pain.  Marland Kitchen ketoconazole (NIZORAL) 2 % cream Apply 1 application topically 2 (two) times daily. In the groin  . meclizine (ANTIVERT) 12.5 MG tablet Take 1-2 tablets (12.5-25 mg total) by mouth 3 (three) times daily as needed for dizziness.  . metoprolol succinate (TOPROL-XL) 25 MG 24 hr tablet Take 0.5 tablets (12.5 mg total) by mouth at bedtime.  Marland Kitchen umeclidinium-vilanterol (ANORO ELLIPTA) 62.5-25 MCG/INH AEPB Inhale 1 puff into the lungs daily.  . pramipexole (MIRAPEX) 0.125 MG tablet Take 1-2 tablets (0.125-0.25 mg total) by mouth at bedtime.  . [DISCONTINUED] fluconazole (DIFLUCAN) 100 MG tablet Take 2 tabs on day#1, then 1 tab daily on Days #2-10 (Patient not taking: Reported on 10/28/2018)  . [DISCONTINUED] predniSONE (DELTASONE) 10 MG  tablet Prednisone 10 mg: take 4 tabs a day x 3 days; then 3 tabs a day x 4 days; then 2 tabs a day x 4 days, then 1 tab a day x 6 days, then stop. Take pc. (Patient not taking: Reported on 10/28/2018)   No facility-administered encounter medications on file as of 10/28/2018.     Activities of Daily Living In your present state of health, do you have any difficulty performing the following activities: 10/28/2018  Hearing? N  Vision? N  Difficulty concentrating or making decisions? N  Walking or climbing stairs? N  Dressing or bathing? N  Doing errands, shopping? N  Preparing Food and eating ? N  Using the Toilet? N  In the past six months, have you accidently leaked urine? N  Do you have problems with loss of bowel control? N  Managing your Medications? N  Managing your Finances? N  Housekeeping or managing your Housekeeping? N  Some recent data might be hidden    Patient Care Team: Plotnikov, Evie Lacks, MD as PCP - General Erline Levine, MD as Consulting Physician (Neurosurgery)    Assessment:   This is a routine wellness examination for Tasmia. Physical assessment deferred to PCP.  Exercise Activities and Dietary recommendations Current Exercise Habits: Home exercise routine, Type of exercise: walking, Time (Minutes): 30, Frequency (Times/Week): 5, Weekly Exercise (Minutes/Week): 150, Intensity: Mild, Exercise limited by: orthopedic condition(s)  Diet (meal preparation, eat out, water intake, caffeinated beverages, dairy products, fruits and vegetables): in general, a "healthy" diet  , well balanced   Reviewed heart healthy and diabetic diet. Encouraged patient to increase daily water and healthy fluid intake. Diet education was provided via handout.  Discussed supplementing with glucerna, samples and coupons provided.  Goals      Patient Stated   . patient (pt-stated)     Book; "saying goodnight to insomnia"  Or other sleep type workbook       Other   . Eat healthier and  increase water intake    . Patient Stated     Eat as healthy as possible and stop going back for second portions. Stay active by going out in my yard and walking around. Enjoy reading and sitting in my swing.    . Weight < 180 lb (81.647 kg)     Plan; watches the cooking shows and weight loss programs; popcorn Foods at hs; baked kale at hs; bowl of cereal; discussed walking during commercials;  100 calories /individual serving        Fall Risk Fall Risk  10/28/2018 12/21/2017 09/26/2016 07/03/2015 09/22/2014  Falls in the past year? 0 No No No No  Number falls in past yr: 0 - - - -   Depression Screen PHQ 2/9 Scores 10/28/2018 12/21/2017 09/26/2016 07/03/2015  PHQ - 2 Score 2 1 1  0  PHQ- 9 Score 7 - - -     Cognitive Function       Ad8 score reviewed for issues:  Issues making decisions: no  Less interest in hobbies / activities: no  Repeats questions, stories (family complaining): no  Trouble using ordinary gadgets (microwave, computer, phone):no  Forgets the month or year: no  Mismanaging finances: no  Remembering appts: no  Daily problems with thinking and/or memory: no Ad8 score is= 0  Immunization History  Administered Date(s) Administered  . Pneumococcal Conjugate-13 06/27/2016  . Pneumococcal Polysaccharide-23 04/14/2005, 09/17/2017   Screening Tests Health Maintenance  Topic Date Due  . Hepatitis C Screening  [birth year]  . FOOT EXAM  04/25/1958  . URINE MICROALBUMIN  04/25/1958  . TETANUS/TDAP  04/26/1967  . HEMOGLOBIN A1C  11/23/2018  . INFLUENZA VACCINE  02/12/2019  . OPHTHALMOLOGY EXAM  04/15/2019  . COLONOSCOPY  08/29/2019  . MAMMOGRAM  09/19/2019  . DEXA SCAN  Completed  . PNA vac Low Risk Adult  Completed      Plan:     Reviewed health maintenance screenings with patient today and relevant education, vaccines, and/or referrals were provided.   Continue doing brain stimulating activities (puzzles, reading, adult coloring books, staying  active) to keep memory sharp.   Continue to eat heart healthy diet (full of fruits, vegetables,  whole grains, lean protein, water--limit salt, fat, and sugar intake) and increase physical activity as tolerated.   I have personally reviewed and noted the following in the patient's chart:   . Medical and social history . Use of alcohol, tobacco or illicit drugs  . Current medications and supplements . Functional ability and status . Nutritional status . Physical activity . Advanced directives . List of other physicians . Vitals . Screenings to include cognitive, depression, and falls . Referrals and appointments  In addition, I have reviewed and discussed with patient certain preventive protocols, quality metrics, and best practice recommendations. A written personalized care plan for preventive services as well as general preventive health recommendations were provided to patient.     Michiel Cowboy, RN  10/28/2018   Medical screening examination/treatment/procedure(s) were performed by non-physician practitioner and as supervising physician I was immediately available for consultation/collaboration. I agree with above. Lew Dawes, MD

## 2018-10-28 ENCOUNTER — Other Ambulatory Visit (INDEPENDENT_AMBULATORY_CARE_PROVIDER_SITE_OTHER): Payer: Medicare Other

## 2018-10-28 ENCOUNTER — Other Ambulatory Visit: Payer: Self-pay

## 2018-10-28 ENCOUNTER — Encounter: Payer: Self-pay | Admitting: Internal Medicine

## 2018-10-28 ENCOUNTER — Ambulatory Visit (INDEPENDENT_AMBULATORY_CARE_PROVIDER_SITE_OTHER): Payer: Medicare Other | Admitting: Internal Medicine

## 2018-10-28 ENCOUNTER — Ambulatory Visit: Payer: Medicare Other | Admitting: *Deleted

## 2018-10-28 VITALS — BP 124/68 | HR 65 | Temp 98.2°F | Ht 62.0 in | Wt 197.0 lb

## 2018-10-28 VITALS — BP 124/68 | HR 65 | Temp 98.2°F | Resp 17 | Ht 62.0 in | Wt 197.0 lb

## 2018-10-28 DIAGNOSIS — E119 Type 2 diabetes mellitus without complications: Secondary | ICD-10-CM

## 2018-10-28 DIAGNOSIS — Z0001 Encounter for general adult medical examination with abnormal findings: Secondary | ICD-10-CM | POA: Diagnosis not present

## 2018-10-28 DIAGNOSIS — G2581 Restless legs syndrome: Secondary | ICD-10-CM

## 2018-10-28 DIAGNOSIS — M5440 Lumbago with sciatica, unspecified side: Secondary | ICD-10-CM

## 2018-10-28 DIAGNOSIS — G8929 Other chronic pain: Secondary | ICD-10-CM | POA: Diagnosis not present

## 2018-10-28 DIAGNOSIS — F411 Generalized anxiety disorder: Secondary | ICD-10-CM | POA: Diagnosis not present

## 2018-10-28 DIAGNOSIS — Z0000 Encounter for general adult medical examination without abnormal findings: Secondary | ICD-10-CM

## 2018-10-28 LAB — BASIC METABOLIC PANEL
BUN: 21 mg/dL (ref 6–23)
CO2: 27 mEq/L (ref 19–32)
Calcium: 9.2 mg/dL (ref 8.4–10.5)
Chloride: 105 mEq/L (ref 96–112)
Creatinine, Ser: 0.68 mg/dL (ref 0.40–1.20)
GFR: 85.42 mL/min (ref >60.00)
Glucose, Bld: 101 mg/dL — ABNORMAL HIGH (ref 70–99)
Potassium: 4.2 mEq/L (ref 3.5–5.1)
Sodium: 140 mEq/L (ref 135–145)

## 2018-10-28 LAB — HEMOGLOBIN A1C: Hgb A1c MFr Bld: 6.3 % (ref 4.6–6.5)

## 2018-10-28 MED ORDER — GABAPENTIN 300 MG PO CAPS: 300.0000 mg | ORAL_CAPSULE | Freq: Three times a day (TID) | ORAL | 5 refills | Status: DC

## 2018-10-28 MED ORDER — HYDROCODONE-ACETAMINOPHEN 7.5-325 MG PO TABS: 1.0000 | ORAL_TABLET | Freq: Three times a day (TID) | ORAL | 0 refills | Status: DC | PRN

## 2018-10-28 NOTE — Assessment & Plan Note (Signed)
On diet Risks associated with diet noncompliance were discussed. Compliance was encouraged. 

## 2018-10-28 NOTE — Assessment & Plan Note (Signed)
Gabapentin

## 2018-10-28 NOTE — Assessment & Plan Note (Signed)
Norco prn, Gabapentin  Potential benefits of a long term opioids use as well as potential risks (i.e. addiction risk, apnea etc) and complications (i.e. Somnolence, constipation and others) were explained to the patient and were aknowledged.   

## 2018-10-28 NOTE — Assessment & Plan Note (Signed)
On PRN Xanax  Potential benefits of a long term benzodiazepines  use as well as potential risks  and complications were explained to the patient and were aknowledged. 

## 2018-10-28 NOTE — Assessment & Plan Note (Addendum)
Here for medicare wellness/physical  Diet: heart healthy  Physical activity: not sedentary  Depression/mood screen: anxiety Hearing: intact to whispered voice  Visual acuity: grossly normal, performs annual eye exam; uses readers ADLs: capable  Fall risk: low Home safety: good  Cognitive evaluation: intact to orientation, naming, recall and repetition  EOL planning: adv directives, full code/ I agree  I have personally reviewed and have noted  1. The patient's medical, surgical and social history  2. Their use of alcohol, tobacco or illicit drugs  3. Their current medications and supplements  4. The patient's functional ability including ADL's, fall risks, home safety risks and hearing or visual impairment.  5. Diet and physical activities  6. Evidence for depression or mood disorders - anxiety 7. The roster of all physicians providing medical care to patient - is listed in the Snapshot section of the chart and reviewed today.    Today patient counseled on age appropriate routine health concerns for screening and prevention, each reviewed and up to date or declined. Immunizations reviewed and up to date or declined. Labs ordered and reviewed. Risk factors for depression reviewed and negative. Hearing function and visual acuity are intact. ADLs screened and addressed as needed. Functional ability and level of safety reviewed and appropriate. Education, counseling and referrals performed based on assessed risks today. Patient provided with a copy of personalized plan for preventive services.  Refused tdap, zoster vaccine Last colon 2016

## 2018-10-28 NOTE — Progress Notes (Signed)
Subjective:  Patient ID: Amanda Arnold, female    DOB: [birth year]  Age: 73 y.o. MRN: [id number]  CC: No chief complaint on file.   HPI Amanda Arnold presents for a well exam F/u R>L sciatica - not better, burning pain. Burning is helped w?gabapentin F/u DM, HTN  Outpatient Medications Prior to Visit  Medication Sig Dispense Refill  . ALPRAZolam (XANAX) 0.5 MG tablet TAKE 1 TABLET(0.5 MG) BY MOUTH TWICE DAILY AS NEEDED FOR ANXIETY 60 tablet 2  . Cholecalciferol (VITAMIN D3) 2000 UNITS capsule Take 1 capsule (2,000 Units total) by mouth daily. 100 capsule 3  . diazepam (VALIUM) 2 MG tablet Take 1 tablet (2 mg total) by mouth every 6 (six) hours as needed for muscle spasms. 15 tablet 0  . gabapentin (NEURONTIN) 300 MG capsule Take 1 capsule (300 mg total) by mouth 3 (three) times daily. 90 capsule 1  . HYDROcodone-acetaminophen (NORCO) 7.5-325 MG tablet Take 1 tablet by mouth every 8 (eight) hours as needed for severe pain. 90 tablet 0  . ketoconazole (NIZORAL) 2 % cream Apply 1 application topically 2 (two) times daily. In the groin 60 g 1  . meclizine (ANTIVERT) 12.5 MG tablet Take 1-2 tablets (12.5-25 mg total) by mouth 3 (three) times daily as needed for dizziness. 60 tablet 1  . metoprolol succinate (TOPROL-XL) 25 MG 24 hr tablet Take 0.5 tablets (12.5 mg total) by mouth at bedtime. 45 tablet 3  . pramipexole (MIRAPEX) 0.125 MG tablet Take 1-2 tablets (0.125-0.25 mg total) by mouth at bedtime. 60 tablet 5  . umeclidinium-vilanterol (ANORO ELLIPTA) 62.5-25 MCG/INH AEPB Inhale 1 puff into the lungs daily. 1 each 11   No facility-administered medications prior to visit.     ROS: Review of Systems  Constitutional: Negative for activity change, appetite change, chills, fatigue and unexpected weight change.  HENT: Negative for congestion, mouth sores and sinus pressure.   Eyes: Negative for visual disturbance.  Respiratory: Negative for cough and chest tightness.    Gastrointestinal: Negative for abdominal pain and nausea.  Genitourinary: Negative for difficulty urinating, frequency and vaginal pain.  Musculoskeletal: Positive for arthralgias, back pain and gait problem.  Skin: Negative for pallor and rash.  Neurological: Negative for dizziness, tremors, weakness, numbness and headaches.  Psychiatric/Behavioral: Negative for confusion, sleep disturbance and suicidal ideas. The patient is nervous/anxious.     Objective:  BP 124/68   Pulse 65   Temp 98.2 F (36.8 C) (Oral)   Ht 5\' 2"  (1.575 m)   Wt 197 lb (89.4 kg)   SpO2 97%   BMI 36.03 kg/m   BP Readings from Last 3 Encounters:  10/28/18 124/68  10/28/18 124/68  09/01/18 118/66    Wt Readings from Last 3 Encounters:  10/28/18 197 lb (89.4 kg)  10/28/18 197 lb (89.4 kg)  09/01/18 196 lb (88.9 kg)    Physical Exam Constitutional:      General: She is not in acute distress.    Appearance: She is well-developed.  HENT:     Head: Normocephalic.     Right Ear: External ear normal.     Left Ear: External ear normal.     Nose: Nose normal.  Eyes:     General:        Right eye: No discharge.        Left eye: No discharge.     Conjunctiva/sclera: Conjunctivae normal.     Pupils: Pupils are equal, round, and reactive to light.  Neck:  Musculoskeletal: Normal range of motion and neck supple.     Thyroid: No thyromegaly.     Vascular: No JVD.     Trachea: No tracheal deviation.  Cardiovascular:     Rate and Rhythm: Normal rate and regular rhythm.     Heart sounds: Normal heart sounds.  Pulmonary:     Effort: No respiratory distress.     Breath sounds: No stridor. No wheezing.  Abdominal:     General: Bowel sounds are normal. There is no distension.     Palpations: Abdomen is soft. There is no mass.     Tenderness: There is no abdominal tenderness. There is no guarding or rebound.  Musculoskeletal:        General: Tenderness present.     Left lower leg: No edema.   Lymphadenopathy:     Cervical: No cervical adenopathy.  Skin:    Findings: No erythema or rash.  Neurological:     Cranial Nerves: No cranial nerve deficit.     Motor: No abnormal muscle tone.     Coordination: Coordination normal.     Deep Tendon Reflexes: Reflexes normal.  Psychiatric:        Behavior: Behavior normal.        Thought Content: Thought content normal.        Judgment: Judgment normal.   Obese LS w/pain on ROM STR leg elev +/- B  Lab Results  Component Value Date   WBC 6.8 08/10/2018   HGB 13.6 08/10/2018   HCT 40.8 08/10/2018   PLT 190.0 08/10/2018   GLUCOSE 119 (H) 08/10/2018   CHOL 167 09/17/2017   TRIG 92.0 09/17/2017   HDL 53.00 09/17/2017   LDLCALC 96 09/17/2017   ALT 28 08/10/2018   AST 25 08/10/2018   NA 141 08/10/2018   K 4.4 08/10/2018   CL 105 08/10/2018   CREATININE 0.69 08/10/2018   BUN 18 08/10/2018   CO2 27 08/10/2018   TSH 1.50 09/17/2017   HGBA1C 6.2 05/25/2018    Vas [place] Lower Extremity Venous (dvt)  Result Date: 09/03/2018  Lower Venous Study Indications: Pain. Other Indications: Patient complains of pain in the right hip and radiates to                    the thigh for the past month. She denies any shortness of                    breath. Risk Factors: None identified. Performing Technologist: Wilkie Aye RVT  Examination Guidelines: A complete evaluation includes B-mode imaging, spectral Doppler, color Doppler, and power Doppler as needed of all accessible portions of each vessel. Bilateral testing is considered an integral part of a complete examination. Limited examinations for reoccurring indications may be performed as noted.  Right Venous Findings: +---------+---------------+---------+-----------+----------+-------+          CompressibilityPhasicitySpontaneityPropertiesSummary +---------+---------------+---------+-----------+----------+-------+ CFV      Full           Yes      Yes                           +---------+---------------+---------+-----------+----------+-------+ SFJ      Full           Yes      Yes                          +---------+---------------+---------+-----------+----------+-------+ FV Prox  Full           Yes      Yes                          +---------+---------------+---------+-----------+----------+-------+ FV Mid   Full           Yes      Yes                          +---------+---------------+---------+-----------+----------+-------+ FV DistalFull           Yes      Yes                          +---------+---------------+---------+-----------+----------+-------+ PFV      Full                                                 +---------+---------------+---------+-----------+----------+-------+ POP      Full           Yes      Yes                          +---------+---------------+---------+-----------+----------+-------+ PTV      Full           Yes      Yes                          +---------+---------------+---------+-----------+----------+-------+ PERO     Full           Yes      Yes                          +---------+---------------+---------+-----------+----------+-------+ Gastroc  Full                                                 +---------+---------------+---------+-----------+----------+-------+ GSV      Full           Yes      Yes                          +---------+---------------+---------+-----------+----------+-------+   Summary: Right: No evidence of deep vein thrombosis in the lower extremity. No indirect evidence of obstruction proximal to the inguinal ligament. No cystic structure found in the popliteal fossa. Left: No evidence of common femoral vein obstruction.  *See table(s) above for measurements and observations. Electronically signed by Carlyle Dolly MD on 09/03/2018 at 3:24:17 PM.    Final     Assessment & Plan:   There are no diagnoses linked to this encounter.   No orders of the defined  types were placed in this encounter.    Follow-up: No follow-ups on file.  Walker Kehr, MD

## 2018-10-28 NOTE — Patient Instructions (Addendum)
Continue doing brain stimulating activities (puzzles, reading, adult coloring books, staying active) to keep memory sharp.   Continue to eat heart healthy diet (full of fruits, vegetables, whole grains, lean protein, water--limit salt, fat, and sugar intake) and increase physical activity as tolerated.  If you cannot attend class in person, you can still exercise at home. Video taped versions of [employer] classes are shown on [employer] (GT[employer]) at 8 am and 1 pm Mondays through Fridays. You can also purchase a copy of the [employer] DVD by calling [place] (GTN) [employer]. GTN is available on [employer] channel 13 with a digital cable box and on [employer] channel 31. GTN is also available on [employer], channel 99. To view GTN, go to channel 99, press OK, select [place], then select [employer] to start the channel.   Ms. Standre , Thank you for taking time to come for your Medicare Wellness Visit. I appreciate your ongoing commitment to your health goals. Please review the following plan we discussed and let me know if I can assist you in the future.   These are the goals we discussed: Goals      Patient Stated   . patient (pt-stated)     Book; "saying goodnight to insomnia"  Or other sleep type workbook       Other   . Eat healthier and increase water intake    . Patient Stated     Eat as healthy as possible and stop going back for second portions. Stay active by going out in my yard and walking around. Enjoy reading and sitting in my swing.    . Weight < 180 lb (81.647 kg)     Plan; watches the cooking shows and weight loss programs; popcorn Foods at hs; baked kale at hs; bowl of cereal; discussed walking during commercials;  100 calories /individual serving        This is a list of the screening recommended for you and due dates:  Health Maintenance  Topic Date Due  .  Hepatitis C: One time screening is recommended by Center for Disease  Control  (CDC) for  adults born from [birth year] through 1963.   Aug 26, 1945  . Complete foot exam   04/25/1958  . Urine Protein Check  04/25/1958  . Tetanus Vaccine  04/26/1967  . Hemoglobin A1C  11/23/2018  . Flu Shot  02/12/2019  . Eye exam for diabetics  04/15/2019  . Colon Cancer Screening  08/29/2019  . Mammogram  09/19/2019  . DEXA scan (bone density measurement)  Completed  . Pneumonia vaccines  Completed

## 2018-11-24 ENCOUNTER — Emergency Department (HOSPITAL_COMMUNITY): Payer: Medicare Other

## 2018-11-24 ENCOUNTER — Other Ambulatory Visit: Payer: Self-pay

## 2018-11-24 ENCOUNTER — Emergency Department (HOSPITAL_COMMUNITY)
Admission: EM | Admit: 2018-11-24 | Discharge: 2018-11-24 | Disposition: A | Discharge status: Home / Self Care | Payer: Medicare Other | Attending: Emergency Medicine | Admitting: Emergency Medicine

## 2018-11-24 DIAGNOSIS — Y92512 Supermarket, store or market as the place of occurrence of the external cause: Secondary | ICD-10-CM | POA: Diagnosis not present

## 2018-11-24 DIAGNOSIS — I10 Essential (primary) hypertension: Secondary | ICD-10-CM | POA: Diagnosis not present

## 2018-11-24 DIAGNOSIS — F17210 Nicotine dependence, cigarettes, uncomplicated: Secondary | ICD-10-CM | POA: Diagnosis not present

## 2018-11-24 DIAGNOSIS — J449 Chronic obstructive pulmonary disease, unspecified: Secondary | ICD-10-CM | POA: Insufficient documentation

## 2018-11-24 DIAGNOSIS — W19XXXA Unspecified fall, initial encounter: Secondary | ICD-10-CM | POA: Diagnosis not present

## 2018-11-24 DIAGNOSIS — W1830XA Fall on same level, unspecified, initial encounter: Secondary | ICD-10-CM | POA: Diagnosis not present

## 2018-11-24 DIAGNOSIS — S4992XA Unspecified injury of left shoulder and upper arm, initial encounter: Secondary | ICD-10-CM | POA: Diagnosis present

## 2018-11-24 DIAGNOSIS — S42212A Unspecified displaced fracture of surgical neck of left humerus, initial encounter for closed fracture: Secondary | ICD-10-CM | POA: Diagnosis not present

## 2018-11-24 DIAGNOSIS — Y9301 Activity, walking, marching and hiking: Secondary | ICD-10-CM | POA: Diagnosis not present

## 2018-11-24 DIAGNOSIS — R52 Pain, unspecified: Secondary | ICD-10-CM | POA: Diagnosis not present

## 2018-11-24 DIAGNOSIS — Z79899 Other long term (current) drug therapy: Secondary | ICD-10-CM | POA: Insufficient documentation

## 2018-11-24 DIAGNOSIS — Y999 Unspecified external cause status: Secondary | ICD-10-CM | POA: Insufficient documentation

## 2018-11-24 DIAGNOSIS — M79603 Pain in arm, unspecified: Secondary | ICD-10-CM | POA: Diagnosis not present

## 2018-11-24 MED ORDER — HYDROCODONE-ACETAMINOPHEN 7.5-325 MG PO TABS
1.0000 | ORAL_TABLET | Freq: Four times a day (QID) | ORAL | Status: DC | PRN
  Administered 2018-11-24: 15:00:00 1 via ORAL
  Filled 2018-11-24: qty 1

## 2018-11-24 MED ORDER — FENTANYL CITRATE (PF) 100 MCG/2ML IJ SOLN
50.0000 ug | Freq: Once | INTRAMUSCULAR | Status: AC
  Administered 2018-11-24: 50 ug via INTRAVENOUS
  Filled 2018-11-24: qty 2

## 2018-11-24 MED ORDER — FENTANYL CITRATE (PF) 100 MCG/2ML IJ SOLN
25.0000 ug | Freq: Once | INTRAMUSCULAR | Status: AC
  Administered 2018-11-24: 25 ug via INTRAVENOUS
  Filled 2018-11-24: qty 2

## 2018-11-24 NOTE — Discharge Instructions (Addendum)
A copy of your x-ray is attached to your paperwork.  I have provided the number to Dr.Niu please schedule an appointment for follow-up.  You may take your hydrocodone 7.5 for your pain, please maintain your arm in the sling.

## 2018-11-24 NOTE — ED Triage Notes (Signed)
Pt arrived to ED via EMS after a fall at [employer]. Per EMS pt fell on L arm. Pt c/o L arm pain. Pt in arm sling on arrival to ED.

## 2018-11-24 NOTE — ED Notes (Signed)
Spoke with pts daughter on phone and provided pt update.

## 2018-11-24 NOTE — ED Notes (Signed)
Reviewed discharge instructions with pt. Pt verbalized understanding of instructions. Opportunity for questions provided. Pt left with all belongings.

## 2018-11-24 NOTE — ED Notes (Signed)
Provided update to patient's daughter.

## 2018-11-24 NOTE — ED Notes (Signed)
Paged ortho tech 

## 2018-11-24 NOTE — ED Provider Notes (Addendum)
[facility] EMERGENCY DEPARTMENT Provider Note   CSN: [id number] Arrival date & time: 11/24/18  1026    History   Chief Complaint Chief Complaint  Patient presents with   Fall    HPI Amanda Arnold is a 73 y.o. female.     73 y.o female with a PMH HTN, HLD, COPD presents to the ED via EMS s/p mechanical fall. Patient reports she was at [employer] when she did not pick up her foot while walking and slipped and fell. She reports falling on her left arm, reports a sharp sensation from her left shoulder radiating to her left hand, reports this pain is worse with movement. She has not taken any therapy for relieve in symptoms. She denies striking her headache or loss of consciousness. She denies any blood thinners, nausea, vomiting or dizziness prior to fall. She denies any other complaints.      Past Medical History:  Diagnosis Date   Abdominal pain, epigastric 12/18/2009   ANXIETY 05/25/2006   BRONCHITIS, ACUTE 12/08/2007   COPD 05/25/2006   DEPRESSION 01/02/2006   ESOTROPIA, LEFT EYE 10-23-1945   FIBROMYALGIA 04/02/2006   Headache(784.0) 12/08/2007   HYPERGLYCEMIA 05/15/2010   HYPERLIPIDEMIA 04/30/2006   HYPERTENSION 05/25/2006   Hypertension    INSOMNIA, PERSISTENT 02/22/2008   Lazy eye    left   LOW BACK PAIN 01/27/2007   Migraines    NEURALGIA, 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/2009   OBSTRUCTIVE SLEEP APNEA 04/17/2010   does not use CPAP   OSTEOARTHRITIS 05/25/2006   Polyuria    Pre-diabetes    Psychosomatic disease 2011   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   T M J 05/11/2009    Patient Active Problem List   Diagnosis Date Noted   Trochanteric bursitis of right hip 07/12/2018   Trochanteric bursitis of both hips 03/23/2018   Neck pain 02/11/2018   Peroneal mononeuropathy, left 01/26/2017   Skin cyst 09/26/2016   Adjustment disorder with mixed anxiety and depressed mood 06/27/2016   Fatigue 03/28/2016   Restless leg syndrome  03/28/2016   RLQ abdominal pain 08/15/2015   Well adult exam 07/03/2015   Meralgia paraesthetica 05/29/2015   Paresthesia 05/15/2015   Sciatic leg pain 09/22/2014   Rash and nonspecific skin eruption 09/11/2014   Right lower quadrant pain 09/11/2014   Nonspecific abnormal electrocardiogram (ECG) (EKG) 07/27/2014   Precordial pain 07/27/2014   Cerumen impaction 06/16/2014   Chest pain, atypical 03/03/2014   UTI (urinary tract infection) 03/29/2013   Herpes zoster 03/02/2013   Abscess of left leg 02/02/2013   Leg pain, bilateral 12/09/2012   Left groin pain 09/09/2012   Unspecified sinusitis (chronic) 08/25/2012   Chest wall pain 05/20/2012   Nocturia 12/24/2011   Psychosomatic pain 10/14/2011   Left shoulder pain 08/22/2011   Vertigo 06/13/2011   Syncopal vertigo 06/13/2011   Intertriginous candidiasis 03/07/2011   Diabetes type 2, controlled ([place]) 03/07/2011   HIP PAIN 06/17/2010   HYPERGLYCEMIA 05/15/2010   CENTRAL SLEEP APNEA CONDS CLASSIFIED ELSEWHERE 05/14/2010   Obstructive sleep apnea 04/17/2010   Obesity 04/10/2010   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   DYSPNEA 03/15/2010   TRIGGER FINGER 12/18/2009   ABDOMINAL PAIN, EPIGASTRIC 12/18/2009   KNEE PAIN 07/11/2009   NEURALGIA, 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/2009   COLD SORE 05/11/2009   T M J 05/11/2009   BREAST PAIN 04/13/2009   Edema 03/01/2009   CERVICAL LYMPHADENOPATHY 03/01/2009   PRURITUS 07/28/2008   TACHYCARDIA 07/28/2008   INSOMNIA, PERSISTENT 02/22/2008   Cystitis 02/22/2008  SWEATING 01/18/2008   BRONCHITIS, ACUTE 12/08/2007   Chronic fatigue 12/08/2007   Headache(784.0) 12/08/2007   COUGH 12/08/2007   ARTHRALGIA 11/19/2007   LOW BACK PAIN 01/27/2007   Tobacco use disorder 11/28/2006   Generalized anxiety disorder 05/25/2006   Essential hypertension 05/25/2006   COPD exacerbation ([place]) 05/25/2006   OSTEOARTHRITIS 05/25/2006   Polyuria 05/25/2006    Dyslipidemia 04/30/2006   Myalgia 04/02/2006   Anxiety and depression 01/02/2006   Disturbance of skin sensation 05/29/2005   ESOTROPIA, LEFT EYE 1945-09-01    Past Surgical History:  Procedure Laterality Date   CHOLECYSTECTOMY     MASS EXCISION Left 10/06/2016   Procedure: EXCISION LEFT BUTTOCKS MASS;  Surgeon: Alphonsa Overall, MD;  Location: [facility];  Service: General;  Laterality: Left;     OB History   No obstetric history on file.      Home Medications    Prior to Admission medications   Medication Sig Start Date End Date Taking? Authorizing Provider  ALPRAZolam (XANAX) 0.5 MG tablet TAKE 1 TABLET(0.5 MG) BY MOUTH TWICE DAILY AS NEEDED FOR ANXIETY Patient taking differently: Take 0.5 mg by mouth 2 (two) times daily as needed for anxiety.  07/29/18  Yes Plotnikov, Evie Lacks, MD  Cholecalciferol (VITAMIN D3) 2000 UNITS capsule Take 1 capsule (2,000 Units total) by mouth daily. 08/22/14  Yes Plotnikov, Evie Lacks, MD  gabapentin (NEURONTIN) 300 MG capsule Take 1 capsule (300 mg total) by mouth 3 (three) times daily. 10/28/18  Yes Plotnikov, Evie Lacks, MD  HYDROcodone-acetaminophen (NORCO) 7.5-325 MG tablet Take 1 tablet by mouth every 8 (eight) hours as needed for severe pain. 10/28/18  Yes Plotnikov, Evie Lacks, MD  ibuprofen (ADVIL) 200 MG tablet Take 600 mg by mouth every 6 (six) hours as needed for moderate pain.   Yes [provider]  meclizine (ANTIVERT) 12.5 MG tablet Take 1-2 tablets (12.5-25 mg total) by mouth 3 (three) times daily as needed for dizziness. 09/17/17  Yes Plotnikov, Evie Lacks, MD  metoprolol succinate (TOPROL-XL) 25 MG 24 hr tablet Take 0.5 tablets (12.5 mg total) by mouth at bedtime. 02/08/18  Yes Plotnikov, Evie Lacks, MD  umeclidinium-vilanterol (ANORO ELLIPTA) 62.5-25 MCG/INH AEPB Inhale 1 puff into the lungs daily. 11/09/15  Yes Plotnikov, Evie Lacks, MD  diazepam (VALIUM) 2 MG tablet Take 1 tablet (2 mg total) by mouth every 6 (six)  hours as needed for muscle spasms. Patient not taking: Reported on 11/24/2018 06/29/18   Lacretia Leigh, MD  ketoconazole (NIZORAL) 2 % cream Apply 1 application topically 2 (two) times daily. In the groin Patient not taking: Reported on 11/24/2018 09/17/17   Plotnikov, Evie Lacks, MD  pramipexole (MIRAPEX) 0.125 MG tablet Take 1-2 tablets (0.125-0.25 mg total) by mouth at bedtime. Patient not taking: Reported on 11/24/2018 12/21/17   Plotnikov, Evie Lacks, MD    Family History Family History  Problem Relation Age of Onset   Heart attack Father    Hypertension Brother    Heart disease Mother    Colon cancer Neg Hx    Stomach cancer Neg Hx     Social History Social History   Tobacco Use   Smoking status: Current Some Day Smoker    Types: Cigarettes    Last attempt to quit: 07/10/2013    Years since quitting: 5.3   Smokeless tobacco: Current User    Types: Snuff   Tobacco comment: smokes 2-4 cigs, maybe more when anxious  Substance Use Topics   Alcohol use: No  Alcohol/week: 0.0 standard drinks   Drug use: No     Allergies   Morphine and related; Bupropion hcl; Ezetimibe; Metformin and related; Naproxen; Trazodone hcl; and Tramadol hcl   Review of Systems Review of Systems  Constitutional: Negative for chills and fever.  HENT: Negative for ear pain and sore throat.   Eyes: Negative for pain and visual disturbance.  Respiratory: Negative for cough and shortness of breath.   Cardiovascular: Negative for chest pain and palpitations.  Gastrointestinal: Negative for abdominal pain and vomiting.  Genitourinary: Negative for dysuria and hematuria.  Musculoskeletal: Positive for arthralgias and myalgias. Negative for back pain.  Skin: Negative for color change and rash.  Neurological: Negative for seizures and syncope.  All other systems reviewed and are negative.    Physical Exam Updated Vital Signs BP 138/68    Pulse 72    Temp 98.3 F (36.8 C) (Oral)    Resp 18     Ht 5\' 2"  (1.575 m)    Wt 88.9 kg    SpO2 97%    BMI 35.85 kg/m   Physical Exam Vitals signs and nursing note reviewed.  Constitutional:      General: She is not in acute distress.    Appearance: She is well-developed.  HENT:     Head: Normocephalic and atraumatic.     Mouth/Throat:     Pharynx: No oropharyngeal exudate.  Eyes:     Pupils: Pupils are equal, round, and reactive to light.  Neck:     Musculoskeletal: Normal range of motion.  Cardiovascular:     Rate and Rhythm: Regular rhythm.     Heart sounds: Normal heart sounds.  Pulmonary:     Effort: Pulmonary effort is normal. No respiratory distress.     Breath sounds: Normal breath sounds.  Abdominal:     General: Bowel sounds are normal. There is no distension.     Palpations: Abdomen is soft.     Tenderness: There is no abdominal tenderness.  Musculoskeletal:        General: No deformity.     Left shoulder: She exhibits decreased range of motion, tenderness, bony tenderness and pain. She exhibits no swelling, no effusion, no crepitus, no deformity, no laceration, no spasm, normal pulse and normal strength.     Right lower leg: No edema.     Left lower leg: No edema.     Comments: Left shoulder immobilized with EMS sling, limited range of motion due to pain.  Tenderness to palpation along shoulder joint.  Pulses present, capillary refill is intact.  Skin:    General: Skin is warm and dry.  Neurological:     Mental Status: She is alert and oriented to person, place, and time.      ED Treatments / Results  Labs (all labs ordered are listed, but only abnormal results are displayed) Labs Reviewed - No data to display  EKG None  Radiology Dg Shoulder Left  Result Date: 11/24/2018 CLINICAL DATA:  Fall. Pain. EXAM: LEFT SHOULDER - 2+ VIEW COMPARISON:  None. FINDINGS: There is a mildly displaced, comminuted, and transverse fracture across the surgical neck of the humerus, with slight displacement greater tuberosity.  No glenohumeral dislocation. Osteopenia. No adjacent rib fracture or pneumothorax. Degenerative change AC joint. IMPRESSION: Comminuted, and transverse fracture across the surgical neck of the humerus, with slight displacement. Electronically Signed   By: Staci Righter M.D.   On: 11/24/2018 11:42    Procedures Procedures (including critical care time)  Medications Ordered in ED Medications  HYDROcodone-acetaminophen (NORCO) 7.5-325 MG per tablet 1 tablet (1 tablet Oral Given 11/24/18 1435)  fentaNYL (SUBLIMAZE) injection 25 mcg (25 mcg Intravenous Given 11/24/18 1116)  fentaNYL (SUBLIMAZE) injection 50 mcg (50 mcg Intravenous Given 11/24/18 1203)     Initial Impression / Assessment and Plan / ED Course  I have reviewed the triage vital signs and the nursing notes.  Pertinent labs & imaging results that were available during my care of the patient were reviewed by me and considered in my medical decision making (see chart for details).  Clinical Course as of Nov 24 1454  Wed Nov 24, 1035  6922 73 year old female right-hand-dominant had a mechanical fall at the store today injuring her left shoulder.  She is distal neurovascular intact.  By x-ray she is a humeral neck fracture.  Will review with orthopedics regarding management.  Patient getting some pain medication.   [MB]    Clinical Course User Index [MB] Hayden Rasmussen, MD    Patient with a past medical history of hypertension, diabetes no longer on diabetic regimen per PCP, presents to the ED status post fall at [facility] landing on her left nondominant arm.  Reports pain along the left shoulder joint region, has limited mobility and range of motion, immobilized on EMS splint. X-ray of the left shoulder showed: Comminuted, and transverse fracture across the surgical neck of the  humerus, with slight displacement.    Patient received 25 mics of fentanyl for her pain via IV, will place call for orthopedics Hilbert Odor, PA for  their recommendations.  11:52 AM spoke to Hilbert Odor, PA orthopedics, he will evaluate patient while in the ED.  Patient's last meal was last night, reports having coffee this morning but has not eaten anything today.  1:37 PM patient was seen and evaluated by Burman Freestone orthopedic PA, per Dr. Blaine Hamper of Ortho, patient to be placed on a shoulder immobilizer along with sling and follow-up outpatient.  For pain therapy, patient is currently on hydrocodone 7.5, as I have review her records in [place] controlled substance database.  She reports taking this for her back pain, arthritis pain and leg pain.  Shared decision conversation about pain medication with patient, she asked if she could just take her pain medication at home, will try giving her 1 tablet here to see how much she tolerates the pain at this time.  2:48 PM spoke to daughter Charleston Ropes @ [phone] who was given instructions for her mother's further follow-up.  Daughter questions were answered, encouraged to have patient continue to keep her sling.  Daughter questioned about muscle relaxers for patient's pain, advised her not recommended due to patient's age.  Will discharge patient with sling, follow-up with orthopedist in medication which she has at home.  Portions of this note were generated with [employer]. Dictation errors may occur despite best attempts at proofreading.    Final Clinical Impressions(s) / ED Diagnoses   Final diagnoses:  Closed displaced fracture of surgical neck of left humerus, unspecified fracture morphology, initial encounter  Fall, initial encounter    ED Discharge Orders    None       Janeece Fitting, PA-C 11/24/18 1456    Janeece Fitting, PA-C 11/24/18 1456    Hayden Rasmussen, MD 11/25/18 [phone]

## 2018-11-24 NOTE — Consult Note (Signed)
Reason for Consult:Left humerus fx Referring Physician: [name] is an 73 y.o. female.  HPI: Amanda Arnold was at the [employer] and her feet stuck to the floor and she fell, striking her left side. She had immediate and severe left shoulder pain. She came to the ED where x-rays showed a proximal humerus fx and orthopedic surgery was consulted. She is RHD.  Past Medical History:  Diagnosis Date  . Abdominal pain, epigastric 12/18/2009  . ANXIETY 05/25/2006  . BRONCHITIS, ACUTE 12/08/2007  . COPD 05/25/2006  . DEPRESSION 01/02/2006  . ESOTROPIA, LEFT EYE 1945/11/03  . FIBROMYALGIA 04/02/2006  . Headache(784.0) 12/08/2007  . HYPERGLYCEMIA 05/15/2010  . HYPERLIPIDEMIA 04/30/2006  . HYPERTENSION 05/25/2006  . Hypertension   . INSOMNIA, PERSISTENT 02/22/2008  . Lazy eye    left  . LOW BACK PAIN 01/27/2007  . Migraines   . 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 OBSTRUCTIVE SLEEP APNEA 04/17/2010   does not use CPAP  . OSTEOARTHRITIS 05/25/2006  . Polyuria   . Pre-diabetes   . Psychosomatic disease 2011  . 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 T M J 05/11/2009    Past Surgical History:  Procedure Laterality Date  . CHOLECYSTECTOMY    . MASS EXCISION Left 10/06/2016   Procedure: EXCISION LEFT BUTTOCKS MASS;  Surgeon: Alphonsa Overall, MD;  Location: [facility];  Service: General;  Laterality: Left;    Family History  Problem Relation Age of Onset  . Heart attack Father   . Hypertension Brother   . Heart disease Mother   . Colon cancer Neg Hx   . Stomach cancer Neg Hx     Social History:  reports that she has been smoking cigarettes. Her smokeless tobacco use includes snuff. She reports that she does not drink alcohol or use drugs.  Allergies:  Allergies  Allergen Reactions  . Morphine And Related Shortness Of Breath  . Bupropion Hcl Other (See Comments)    "does not feel right"  . Duloxetine Other (See Comments)    REACTION: did not feel good  . Ezetimibe Other  (See Comments)    REACTION: legs burning  . Metformin And Related Diarrhea    Diarrhea w/XR or regular   . Naproxen     Upset stomach, diarrhea  . Trazodone Hcl Swelling  . Tramadol Hcl Palpitations    REACTION: palpitations    Medications: I have reviewed the patient's current medications.  No results found for this or any previous visit (from the past 48 hour(s)).  Dg Shoulder Left  Result Date: 11/24/2018 CLINICAL DATA:  Fall. Pain. EXAM: LEFT SHOULDER - 2+ VIEW COMPARISON:  None. FINDINGS: There is a mildly displaced, comminuted, and transverse fracture across the surgical neck of the humerus, with slight displacement greater tuberosity. No glenohumeral dislocation. Osteopenia. No adjacent rib fracture or pneumothorax. Degenerative change AC joint. IMPRESSION: Comminuted, and transverse fracture across the surgical neck of the humerus, with slight displacement. Electronically Signed   By: Staci Righter M.D.   On: 11/24/2018 11:42    Review of Systems  Constitutional: Negative for weight loss.  HENT: Negative for ear discharge, ear pain, hearing loss and tinnitus.   Eyes: Negative for blurred vision, double vision, photophobia and pain.  Respiratory: Negative for cough, sputum production and shortness of breath.   Cardiovascular: Negative for chest pain.  Gastrointestinal: Negative for abdominal pain, nausea and vomiting.  Genitourinary: Negative for dysuria, flank pain, frequency and urgency.  Musculoskeletal: Positive for joint pain (  Left shoulder). Negative for back pain, falls, myalgias and neck pain.  Neurological: Positive for sensory change (Left hand). Negative for dizziness, tingling, focal weakness, loss of consciousness and headaches.  Endo/Heme/Allergies: Does not bruise/bleed easily.  Psychiatric/Behavioral: Negative for depression, memory loss and substance abuse. The patient is not nervous/anxious.    Blood pressure 138/68, pulse 72, temperature 98.3 F (36.8 C),  temperature source Oral, resp. rate 18, height 5\' 2"  (1.575 m), weight 88.9 kg, SpO2 97 %. Physical Exam  Constitutional: She appears well-developed and well-nourished. No distress.  HENT:  Head: Normocephalic and atraumatic.  Eyes: Conjunctivae are normal. Right eye exhibits no discharge. Left eye exhibits no discharge. No scleral icterus.  Neck: Normal range of motion.  Cardiovascular: Normal rate and regular rhythm.  Respiratory: Effort normal. No respiratory distress.  Musculoskeletal:     Comments: Left shoulder, elbow, wrist, digits- no skin wounds, shoulder severe TTP  Sens  Ax intact R/M/U paresthetic  Mot   Ax/ R/ PIN/ M/ AIN/ U intact  Rad 2+  Neurological: She is alert.  Skin: Skin is warm and dry. She is not diaphoretic.  Psychiatric: She has a normal mood and affect. Her behavior is normal.    Assessment/Plan: Left humerus fx -- Sling, NWB, f/u with Dr. Erlinda Hong in office Multiple medical problems including HTN, HLD, and COPD    [name], PA-C Orthopedic Surgery [phone] 11/24/2018, 12:54 PM

## 2018-11-29 ENCOUNTER — Other Ambulatory Visit: Payer: Self-pay

## 2018-11-29 ENCOUNTER — Encounter: Payer: Self-pay | Admitting: Orthopaedic Surgery

## 2018-11-29 ENCOUNTER — Ambulatory Visit: Payer: Medicare Other | Admitting: Orthopaedic Surgery

## 2018-11-29 ENCOUNTER — Ambulatory Visit: Payer: Medicare Other

## 2018-11-29 DIAGNOSIS — S42295A Other nondisplaced fracture of upper end of left humerus, initial encounter for closed fracture: Secondary | ICD-10-CM | POA: Diagnosis not present

## 2018-11-29 NOTE — Progress Notes (Signed)
Office Visit Note   Patient: Amanda Arnold           Date of Birth: 1945/08/12           MRN: [id number] Visit Date: 11/29/2018              Requested by: Cassandria Anger, MD [place], [place] [address] PCP: Cassandria Anger, MD   Assessment & Plan: Visit Diagnoses:  1. Other closed nondisplaced fracture of proximal end of left humerus, initial encounter     Plan: Impression is minimally displaced left proximal humerus fracture.  Repeat x-rays today demonstrate no interval displacement or worsening of the fracture.  We will continue to treat this nonsurgically with a sling.  Elbow and wrist and hand range of motion exercises were demonstrated.  Patient will call if she needs a refill on pain medicines.  She is to continue to wear his shoulder sling at all times.  Recheck next week with AP and scapular Y x-rays of the left shoulder.  Questions encouraged and answered.  Follow-Up Instructions: Return in about 1 week (around 12/06/2018).   Orders:  Orders Placed This Encounter  Procedures   XR Shoulder Left   No orders of the defined types were placed in this encounter.     Procedures: No procedures performed   Clinical Data: No additional findings.   Subjective: Chief Complaint  Patient presents with   Left Shoulder - Pain    Amanda Arnold is a very pleasant 73 year old female comes in for evaluation of a left proximal humerus fracture.  She is right-hand dominant.  She sustained this injury when she fell on 11/24/2018 in a store.  She is not exactly sure what led to the fall.  She was evaluated in the ED and x-rays revealed a minimally displaced left proximal humerus fracture.  She is placed in a sling and she follows up today.  She denies any numbness and tingling.  She does endorse swelling or bruising around the shoulder region.  The shoulder causes her significant discomfort especially at night when she is trying to rest.  She has been taking hydrocodone  and ibuprofen.   Review of Systems  Constitutional: Negative.   HENT: Negative.   Eyes: Negative.   Respiratory: Negative.   Cardiovascular: Negative.   Endocrine: Negative.   Musculoskeletal: Negative.   Neurological: Negative.   Hematological: Negative.   Psychiatric/Behavioral: Negative.   All other systems reviewed and are negative.    Objective: Vital Signs: There were no vitals taken for this visit.  Physical Exam Vitals signs and nursing note reviewed.  Constitutional:      Appearance: She is well-developed.  HENT:     Head: Normocephalic and atraumatic.  Neck:     Musculoskeletal: Neck supple.  Pulmonary:     Effort: Pulmonary effort is normal.  Abdominal:     Palpations: Abdomen is soft.  Skin:    General: Skin is warm.     Capillary Refill: Capillary refill takes less than 2 seconds.  Neurological:     Mental Status: She is alert and oriented to person, place, and time.  Psychiatric:        Behavior: Behavior normal.        Thought Content: Thought content normal.        Judgment: Judgment normal.     Ortho Exam Left shoulder exam shows significant swelling and bruising.  There is no neurovascular compromise.Neurovascular intact distally.  Specialty Comments:  No specialty comments available.  Imaging: No results found.   PMFS History: Patient Active Problem List   Diagnosis Date Noted   Trochanteric bursitis of right hip 07/12/2018   Trochanteric bursitis of both hips 03/23/2018   Neck pain 02/11/2018   Peroneal mononeuropathy, left 01/26/2017   Skin cyst 09/26/2016   Adjustment disorder with mixed anxiety and depressed mood 06/27/2016   Fatigue 03/28/2016   Restless leg syndrome 03/28/2016   RLQ abdominal pain 08/15/2015   Well adult exam 07/03/2015   Meralgia paraesthetica 05/29/2015   Paresthesia 05/15/2015   Sciatic leg pain 09/22/2014   Rash and nonspecific skin eruption 09/11/2014   Right lower quadrant pain  09/11/2014   Nonspecific abnormal electrocardiogram (ECG) (EKG) 07/27/2014   Precordial pain 07/27/2014   Cerumen impaction 06/16/2014   Chest pain, atypical 03/03/2014   UTI (urinary tract infection) 03/29/2013   Herpes zoster 03/02/2013   Abscess of left leg 02/02/2013   Leg pain, bilateral 12/09/2012   Left groin pain 09/09/2012   Unspecified sinusitis (chronic) 08/25/2012   Chest wall pain 05/20/2012   Nocturia 12/24/2011   Psychosomatic pain 10/14/2011   Left shoulder pain 08/22/2011   Vertigo 06/13/2011   Syncopal vertigo 06/13/2011   Intertriginous candidiasis 03/07/2011   Diabetes type 2, controlled ([place]) 03/07/2011   HIP PAIN 06/17/2010   HYPERGLYCEMIA 05/15/2010   CENTRAL SLEEP APNEA CONDS CLASSIFIED ELSEWHERE 05/14/2010   Obstructive sleep apnea 04/17/2010   Obesity 04/10/2010   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   DYSPNEA 03/15/2010   TRIGGER FINGER 12/18/2009   ABDOMINAL PAIN, EPIGASTRIC 12/18/2009   KNEE PAIN 07/11/2009   NEURALGIA, 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/2009   COLD SORE 05/11/2009   T M J 05/11/2009   BREAST PAIN 04/13/2009   Edema 03/01/2009   CERVICAL LYMPHADENOPATHY 03/01/2009   PRURITUS 07/28/2008   TACHYCARDIA 07/28/2008   INSOMNIA, PERSISTENT 02/22/2008   Cystitis 02/22/2008   SWEATING 01/18/2008   BRONCHITIS, ACUTE 12/08/2007   Chronic fatigue 12/08/2007   Headache(784.0) 12/08/2007   COUGH 12/08/2007   ARTHRALGIA 11/19/2007   LOW BACK PAIN 01/27/2007   Tobacco use disorder 11/28/2006   Generalized anxiety disorder 05/25/2006   Essential hypertension 05/25/2006   COPD exacerbation ([place]) 05/25/2006   OSTEOARTHRITIS 05/25/2006   Polyuria 05/25/2006   Dyslipidemia 04/30/2006   Myalgia 04/02/2006   Anxiety and depression 01/02/2006   Disturbance of skin sensation 05/29/2005   ESOTROPIA, LEFT EYE 1946-05-13   Past Medical History:  Diagnosis Date   Abdominal pain, epigastric 12/18/2009    ANXIETY 05/25/2006   BRONCHITIS, ACUTE 12/08/2007   COPD 05/25/2006   DEPRESSION 01/02/2006   ESOTROPIA, LEFT EYE 02/10/1948   FIBROMYALGIA 04/02/2006   Headache(784.0) 12/08/2007   HYPERGLYCEMIA 05/15/2010   HYPERLIPIDEMIA 04/30/2006   HYPERTENSION 05/25/2006   Hypertension    INSOMNIA, PERSISTENT 02/22/2008   Lazy eye    left   LOW BACK PAIN 01/27/2007   Migraines    NEURALGIA, 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/2009   OBSTRUCTIVE SLEEP APNEA 04/17/2010   does not use CPAP   OSTEOARTHRITIS 05/25/2006   Polyuria    Pre-diabetes    Psychosomatic disease 2011   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   T M J 05/11/2009    Family History  Problem Relation Age of Onset   Heart attack Father    Hypertension Brother    Heart disease Mother    Colon cancer Neg Hx    Stomach cancer Neg Hx     Past Surgical History:  Procedure Laterality Date   CHOLECYSTECTOMY  MASS EXCISION Left 10/06/2016   Procedure: EXCISION LEFT BUTTOCKS MASS;  Surgeon: Alphonsa Overall, MD;  Location: [facility];  Service: General;  Laterality: Left;   Social History   Occupational History   Occupation: house wife    Employer: UNEMPLOYED  Tobacco Use   Smoking status: Current Some Day Smoker    Types: Cigarettes    Last attempt to quit: 07/10/2013    Years since quitting: 5.3   Smokeless tobacco: Current User    Types: Snuff   Tobacco comment: smokes 2-4 cigs, maybe more when anxious  Substance and Sexual Activity   Alcohol use: No    Alcohol/week: 0.0 standard drinks   Drug use: No   Sexual activity: Not Currently

## 2018-12-07 ENCOUNTER — Ambulatory Visit: Payer: Medicare Other

## 2018-12-07 ENCOUNTER — Other Ambulatory Visit: Payer: Self-pay

## 2018-12-07 ENCOUNTER — Ambulatory Visit: Payer: Self-pay

## 2018-12-07 ENCOUNTER — Encounter: Payer: Self-pay | Admitting: Orthopaedic Surgery

## 2018-12-07 ENCOUNTER — Ambulatory Visit: Payer: Medicare Other | Admitting: Orthopaedic Surgery

## 2018-12-07 DIAGNOSIS — S42295A Other nondisplaced fracture of upper end of left humerus, initial encounter for closed fracture: Secondary | ICD-10-CM | POA: Diagnosis not present

## 2018-12-07 DIAGNOSIS — S42202A Unspecified fracture of upper end of left humerus, initial encounter for closed fracture: Secondary | ICD-10-CM | POA: Insufficient documentation

## 2018-12-07 NOTE — Progress Notes (Signed)
Office Visit Note   Patient: Amanda Arnold           Date of Birth: [birth year]           MRN: [id number] Visit Date: 12/07/2018              Requested by: Cassandria Anger, MD [place], [place] [id number] PCP: Cassandria Anger, MD   Assessment & Plan: Visit Diagnoses:  1. Other closed nondisplaced fracture of proximal end of left humerus, initial encounter     Plan: Impression is 13 days status post left proximal humerus fracture.  X-rays demonstrate stable alignment of the fracture.  We will continue to treat this conservatively.  She can discontinue her sling in a week's time while at home but still needs to wear this while in public.  She will continue working on wrist and elbow range of motion.  She will incorporate pendulum swings in 1 week's time.  A handout was provided to her for this.  She will follow-up with [place] in 2 weeks time for repeat evaluation and x-rays to include AP and scapular Y of the left shoulder.  Call with concerns or questions in the meantime.  Follow-Up Instructions: Return in about 2 weeks (around 12/21/2018).   Orders:  Orders Placed This Encounter  Procedures  . XR Shoulder Left   No orders of the defined types were placed in this encounter.     Procedures: No procedures performed   Clinical Data: No additional findings.   Subjective: Chief Complaint  Patient presents with  . Left Shoulder - Pain, Follow-up    HPI patient is a pleasant 73 year old female who presents our clinic today approximately 13 days status post left proximal humerus fracture, date of injury 11/24/2018.  She has been compliant wearing her sling.  She has been working on wrist and elbow range of motion.  She still complains of moderate pain to the left shoulder.   Review of Systems   Objective: Vital Signs: There were no vitals taken for this visit.  Physical Exam  Ortho Exam Examination of her left shoulder reveals marked tenderness to the  fracture site.  Moderate ecchymosis as well.  Mild swelling left upper extremity.  Full sensation distally.   Specialty Comments:  No specialty comments available.  Imaging: Xr Shoulder Left  Result Date: 12/07/2018 xrays demonstrate stable alignment of the fracture    PMFS History: Patient Active Problem List   Diagnosis Date Noted  . Closed fracture of left proximal humerus 12/07/2018  . Trochanteric bursitis of right hip 07/12/2018  . Trochanteric bursitis of both hips 03/23/2018  . Neck pain 02/11/2018  . Peroneal mononeuropathy, left 01/26/2017  . Skin cyst 09/26/2016  . Adjustment disorder with mixed anxiety and depressed mood 06/27/2016  . Fatigue 03/28/2016  . Restless leg syndrome 03/28/2016  . RLQ abdominal pain 08/15/2015  . Well adult exam 07/03/2015  . Meralgia paraesthetica 05/29/2015  . Paresthesia 05/15/2015  . Sciatic leg pain 09/22/2014  . Rash and nonspecific skin eruption 09/11/2014  . Right lower quadrant pain 09/11/2014  . Nonspecific abnormal electrocardiogram (ECG) (EKG) 07/27/2014  . Precordial pain 07/27/2014  . Cerumen impaction 06/16/2014  . Chest pain, atypical 03/03/2014  . UTI (urinary tract infection) 03/29/2013  . Herpes zoster 03/02/2013  . Abscess of left leg 02/02/2013  . Leg pain, bilateral 12/09/2012  . Left groin pain 09/09/2012  . Unspecified sinusitis (chronic) 08/25/2012  . Chest wall pain 05/20/2012  .  Nocturia 12/24/2011  . Psychosomatic pain 10/14/2011  . Left shoulder pain 08/22/2011  . Vertigo 06/13/2011  . Syncopal vertigo 06/13/2011  . Intertriginous candidiasis 03/07/2011  . Diabetes type 2, controlled ([place]) 03/07/2011  . HIP PAIN 06/17/2010  . HYPERGLYCEMIA 05/15/2010  . CENTRAL SLEEP APNEA CONDS CLASSIFIED ELSEWHERE 05/14/2010  . Obstructive sleep apnea 04/17/2010  . Obesity 04/10/2010  . 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 DYSPNEA 03/15/2010  . TRIGGER FINGER 12/18/2009  . ABDOMINAL PAIN, EPIGASTRIC  12/18/2009  . KNEE PAIN 07/11/2009  . 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 COLD SORE 05/11/2009  . T M J 05/11/2009  . BREAST PAIN 04/13/2009  . Edema 03/01/2009  . CERVICAL LYMPHADENOPATHY 03/01/2009  . PRURITUS 07/28/2008  . TACHYCARDIA 07/28/2008  . INSOMNIA, PERSISTENT 02/22/2008  . Cystitis 02/22/2008  . SWEATING 01/18/2008  . BRONCHITIS, ACUTE 12/08/2007  . Chronic fatigue 12/08/2007  . Headache(784.0) 12/08/2007  . COUGH 12/08/2007  . ARTHRALGIA 11/19/2007  . LOW BACK PAIN 01/27/2007  . Tobacco use disorder 11/28/2006  . Generalized anxiety disorder 05/25/2006  . Essential hypertension 05/25/2006  . COPD exacerbation ([place]) 05/25/2006  . OSTEOARTHRITIS 05/25/2006  . Polyuria 05/25/2006  . Dyslipidemia 04/30/2006  . Myalgia 04/02/2006  . Anxiety and depression 01/02/2006  . Disturbance of skin sensation 05/29/2005  . ESOTROPIA, LEFT EYE 07/24/47   Past Medical History:  Diagnosis Date  . Abdominal pain, epigastric 12/18/2009  . ANXIETY 05/25/2006  . BRONCHITIS, ACUTE 12/08/2007  . COPD 05/25/2006  . DEPRESSION 01/02/2006  . ESOTROPIA, LEFT EYE 1948/02/16  . FIBROMYALGIA 04/02/2006  . Headache(784.0) 12/08/2007  . HYPERGLYCEMIA 05/15/2010  . HYPERLIPIDEMIA 04/30/2006  . HYPERTENSION 05/25/2006  . Hypertension   . INSOMNIA, PERSISTENT 02/22/2008  . Lazy eye    left  . LOW BACK PAIN 01/27/2007  . Migraines   . 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 OBSTRUCTIVE SLEEP APNEA 04/17/2010   does not use CPAP  . OSTEOARTHRITIS 05/25/2006  . Polyuria   . Pre-diabetes   . Psychosomatic disease 2011  . 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 T M J 05/11/2009    Family History  Problem Relation Age of Onset  . Heart attack Father   . Hypertension Brother   . Heart disease Mother   . Colon cancer Neg Hx   . Stomach cancer Neg Hx     Past Surgical History:  Procedure Laterality Date  . CHOLECYSTECTOMY    . MASS EXCISION Left 10/06/2016   Procedure: EXCISION LEFT BUTTOCKS  MASS;  Surgeon: Alphonsa Overall, MD;  Location: [facility];  Service: General;  Laterality: Left;   Social History   Occupational History  . Occupation: house wife    Employer: UNEMPLOYED  Tobacco Use  . Smoking status: Current Some Day Smoker    Types: Cigarettes    Last attempt to quit: 07/10/2013    Years since quitting: 5.4  . Smokeless tobacco: Current User    Types: Snuff  . Tobacco comment: smokes 2-4 cigs, maybe more when anxious  Substance and Sexual Activity  . Alcohol use: No    Alcohol/week: 0.0 standard drinks  . Drug use: No  . Sexual activity: Not Currently

## 2018-12-11 ENCOUNTER — Other Ambulatory Visit: Payer: Medicare Other

## 2018-12-11 ENCOUNTER — Telehealth: Payer: Self-pay | Admitting: Cardiology

## 2018-12-11 DIAGNOSIS — Z20822 Contact with and (suspected) exposure to covid-19: Secondary | ICD-10-CM

## 2018-12-11 NOTE — Telephone Encounter (Signed)
Spoke with patient regarding possible Covid exposure during recent office visit.  Patient will think about if she wants to be tested explained that she will need to call [phone] to schedule the appt.

## 2018-12-11 NOTE — Addendum Note (Signed)
Addended by: Corky Sox E on: 12/11/2018 10:03 AM   Modules accepted: Orders

## 2018-12-13 LAB — NOVEL CORONAVIRUS, NAA: SARS-CoV-2, NAA: NOT DETECTED

## 2018-12-21 ENCOUNTER — Encounter: Payer: Self-pay | Admitting: Orthopaedic Surgery

## 2018-12-21 ENCOUNTER — Other Ambulatory Visit: Payer: Self-pay

## 2018-12-21 ENCOUNTER — Ambulatory Visit (INDEPENDENT_AMBULATORY_CARE_PROVIDER_SITE_OTHER): Payer: Medicare Other | Admitting: Orthopaedic Surgery

## 2018-12-21 ENCOUNTER — Ambulatory Visit (INDEPENDENT_AMBULATORY_CARE_PROVIDER_SITE_OTHER): Payer: Medicaid Other

## 2018-12-21 DIAGNOSIS — S42295D Other nondisplaced fracture of upper end of left humerus, subsequent encounter for fracture with routine healing: Secondary | ICD-10-CM

## 2018-12-21 NOTE — Progress Notes (Signed)
Office Visit Note   Patient: Amanda Arnold           Date of Birth: [birth year]           MRN: [id number] Visit Date: 12/21/2018              Requested by: Cassandria Anger, MD [place], [place] [address] PCP: Cassandria Anger, MD   Assessment & Plan: Visit Diagnoses:  1. Other closed nondisplaced fracture of proximal end of left humerus with routine healing, subsequent encounter     Plan: Impression is 4 weeks status post left proximal humerus fracture with evidence of healing seen on x-ray today.  We will have the patient continue with pendulum swings as well as wrist and elbow range of motion.  She will continue wearing the sling in public for another 2 weeks and can then discontinue this.  We will start her in formal physical therapy 2 weeks from now and a prescription was provided today for that.  She will follow-up with [place] in 4 weeks time for repeat evaluation and x-rays.  Call with concerns or questions in meantime.  Follow-Up Instructions: Return in about 4 weeks (around 01/18/2019).   Orders:  Orders Placed This Encounter  Procedures  . XR Shoulder Left   No orders of the defined types were placed in this encounter.     Procedures: No procedures performed   Clinical Data: No additional findings.   Subjective: Chief Complaint  Patient presents with  . Left Shoulder - Pain, Follow-up    HPI patient is a pleasant 73 year old female who presents our clinic today for week status post left proximal humerus fracture, date of injury 11/24/2018.  She has been doing well.  She has been compliant in her sling over the past few weeks and has just recently started wearing this only in public.  She has been doing pendulum swings as instructed.  She has minimal pain.  Review of Systems as detailed in HPI.  All others reviewed and are negative.   Objective: Vital Signs: There were no vitals taken for this visit.  Physical Exam well-developed and  well-nourished female no acute distress.  Alert and oriented x3.  Ortho Exam examination of her left shoulder reveals minimal tenderness to the fracture site.  She is neurovascularly intact distally.  Specialty Comments:  No specialty comments available.  Imaging: Xr Shoulder Left  Result Date: 12/21/2018 X-rays demonstrate stable alignment of the fracture with evidence of healing    PMFS History: Patient Active Problem List   Diagnosis Date Noted  . Closed fracture of left proximal humerus 12/07/2018  . Trochanteric bursitis of right hip 07/12/2018  . Trochanteric bursitis of both hips 03/23/2018  . Neck pain 02/11/2018  . Peroneal mononeuropathy, left 01/26/2017  . Skin cyst 09/26/2016  . Adjustment disorder with mixed anxiety and depressed mood 06/27/2016  . Fatigue 03/28/2016  . Restless leg syndrome 03/28/2016  . RLQ abdominal pain 08/15/2015  . Well adult exam 07/03/2015  . Meralgia paraesthetica 05/29/2015  . Paresthesia 05/15/2015  . Sciatic leg pain 09/22/2014  . Rash and nonspecific skin eruption 09/11/2014  . Right lower quadrant pain 09/11/2014  . Nonspecific abnormal electrocardiogram (ECG) (EKG) 07/27/2014  . Precordial pain 07/27/2014  . Cerumen impaction 06/16/2014  . Chest pain, atypical 03/03/2014  . UTI (urinary tract infection) 03/29/2013  . Herpes zoster 03/02/2013  . Abscess of left leg 02/02/2013  . Leg pain, bilateral 12/09/2012  . Left  groin pain 09/09/2012  . Unspecified sinusitis (chronic) 08/25/2012  . Chest wall pain 05/20/2012  . Nocturia 12/24/2011  . Psychosomatic pain 10/14/2011  . Left shoulder pain 08/22/2011  . Vertigo 06/13/2011  . Syncopal vertigo 06/13/2011  . Intertriginous candidiasis 03/07/2011  . Diabetes type 2, controlled ([place]) 03/07/2011  . HIP PAIN 06/17/2010  . HYPERGLYCEMIA 05/15/2010  . CENTRAL SLEEP APNEA CONDS CLASSIFIED ELSEWHERE 05/14/2010  . Obstructive sleep apnea 04/17/2010  . Obesity 04/10/2010  .  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 DYSPNEA 03/15/2010  . TRIGGER FINGER 12/18/2009  . ABDOMINAL PAIN, EPIGASTRIC 12/18/2009  . KNEE PAIN 07/11/2009  . 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 COLD SORE 05/11/2009  . T M J 05/11/2009  . BREAST PAIN 04/13/2009  . Edema 03/01/2009  . CERVICAL LYMPHADENOPATHY 03/01/2009  . PRURITUS 07/28/2008  . TACHYCARDIA 07/28/2008  . INSOMNIA, PERSISTENT 02/22/2008  . Cystitis 02/22/2008  . SWEATING 01/18/2008  . BRONCHITIS, ACUTE 12/08/2007  . Chronic fatigue 12/08/2007  . Headache(784.0) 12/08/2007  . COUGH 12/08/2007  . ARTHRALGIA 11/19/2007  . LOW BACK PAIN 01/27/2007  . Tobacco use disorder 11/28/2006  . Generalized anxiety disorder 05/25/2006  . Essential hypertension 05/25/2006  . COPD exacerbation ([place]) 05/25/2006  . OSTEOARTHRITIS 05/25/2006  . Polyuria 05/25/2006  . Dyslipidemia 04/30/2006  . Myalgia 04/02/2006  . Anxiety and depression 01/02/2006  . Disturbance of skin sensation 05/29/2005  . ESOTROPIA, LEFT EYE 04-22-48   Past Medical History:  Diagnosis Date  . Abdominal pain, epigastric 12/18/2009  . ANXIETY 05/25/2006  . BRONCHITIS, ACUTE 12/08/2007  . COPD 05/25/2006  . DEPRESSION 01/02/2006  . ESOTROPIA, LEFT EYE 1947[birth year]  . FIBROMYALGIA 04/02/2006  . Headache(784.0) 12/08/2007  . HYPERGLYCEMIA 05/15/2010  . HYPERLIPIDEMIA 04/30/2006  . HYPERTENSION 05/25/2006  . Hypertension   . INSOMNIA, PERSISTENT 02/22/2008  . Lazy eye    left  . LOW BACK PAIN 01/27/2007  . Migraines   . 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 OBSTRUCTIVE SLEEP APNEA 04/17/2010   does not use CPAP  . OSTEOARTHRITIS 05/25/2006  . Polyuria   . Pre-diabetes   . Psychosomatic disease 2011  . 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 T M J 05/11/2009    Family History  Problem Relation Age of Onset  . Heart attack Father   . Hypertension Brother   . Heart disease Mother   . Colon cancer Neg Hx   . Stomach cancer Neg Hx     Past Surgical History:   Procedure Laterality Date  . CHOLECYSTECTOMY    . MASS EXCISION Left 10/06/2016   Procedure: EXCISION LEFT BUTTOCKS MASS;  Surgeon: Alphonsa Overall, MD;  Location: [facility];  Service: General;  Laterality: Left;   Social History   Occupational History  . Occupation: house wife    Employer: UNEMPLOYED  Tobacco Use  . Smoking status: Current Some Day Smoker    Types: Cigarettes    Last attempt to quit: 07/10/2013    Years since quitting: 5.4  . Smokeless tobacco: Current User    Types: Snuff  . Tobacco comment: smokes 2-4 cigs, maybe more when anxious  Substance and Sexual Activity  . Alcohol use: No    Alcohol/week: 0.0 standard drinks  . Drug use: No  . Sexual activity: Not Currently

## 2018-12-22 ENCOUNTER — Telehealth: Payer: Self-pay | Admitting: *Deleted

## 2018-12-22 DIAGNOSIS — S42295D Other nondisplaced fracture of upper end of left humerus, subsequent encounter for fracture with routine healing: Secondary | ICD-10-CM

## 2018-12-22 NOTE — Telephone Encounter (Signed)
Entered into system and faxed to [facility].

## 2018-12-22 NOTE — Telephone Encounter (Signed)
Received call from [facility] stating pt came into their office yesterday to make appt for Physical therapy but there was no order for pt. They do not know what is needed, who the doctor was that ordered the PT and dx code and such. They would like order to be faxed to them at [phone], and I am requesting this to also be put into referral workque for records.   Thanks

## 2018-12-27 DIAGNOSIS — M25612 Stiffness of left shoulder, not elsewhere classified: Secondary | ICD-10-CM | POA: Diagnosis not present

## 2018-12-27 DIAGNOSIS — M25512 Pain in left shoulder: Secondary | ICD-10-CM | POA: Diagnosis not present

## 2018-12-27 DIAGNOSIS — S42202D Unspecified fracture of upper end of left humerus, subsequent encounter for fracture with routine healing: Secondary | ICD-10-CM | POA: Diagnosis not present

## 2018-12-27 DIAGNOSIS — R293 Abnormal posture: Secondary | ICD-10-CM | POA: Diagnosis not present

## 2018-12-27 DIAGNOSIS — M6281 Muscle weakness (generalized): Secondary | ICD-10-CM | POA: Diagnosis not present

## 2018-12-29 DIAGNOSIS — R293 Abnormal posture: Secondary | ICD-10-CM | POA: Diagnosis not present

## 2018-12-29 DIAGNOSIS — S42202D Unspecified fracture of upper end of left humerus, subsequent encounter for fracture with routine healing: Secondary | ICD-10-CM | POA: Diagnosis not present

## 2018-12-29 DIAGNOSIS — M6281 Muscle weakness (generalized): Secondary | ICD-10-CM | POA: Diagnosis not present

## 2018-12-29 DIAGNOSIS — M25512 Pain in left shoulder: Secondary | ICD-10-CM | POA: Diagnosis not present

## 2018-12-29 DIAGNOSIS — M25612 Stiffness of left shoulder, not elsewhere classified: Secondary | ICD-10-CM | POA: Diagnosis not present

## 2018-12-31 DIAGNOSIS — S42202D Unspecified fracture of upper end of left humerus, subsequent encounter for fracture with routine healing: Secondary | ICD-10-CM | POA: Diagnosis not present

## 2018-12-31 DIAGNOSIS — M6281 Muscle weakness (generalized): Secondary | ICD-10-CM | POA: Diagnosis not present

## 2018-12-31 DIAGNOSIS — R293 Abnormal posture: Secondary | ICD-10-CM | POA: Diagnosis not present

## 2018-12-31 DIAGNOSIS — M25612 Stiffness of left shoulder, not elsewhere classified: Secondary | ICD-10-CM | POA: Diagnosis not present

## 2018-12-31 DIAGNOSIS — M25512 Pain in left shoulder: Secondary | ICD-10-CM | POA: Diagnosis not present

## 2019-01-03 DIAGNOSIS — R293 Abnormal posture: Secondary | ICD-10-CM | POA: Diagnosis not present

## 2019-01-03 DIAGNOSIS — M25612 Stiffness of left shoulder, not elsewhere classified: Secondary | ICD-10-CM | POA: Diagnosis not present

## 2019-01-03 DIAGNOSIS — M25512 Pain in left shoulder: Secondary | ICD-10-CM | POA: Diagnosis not present

## 2019-01-03 DIAGNOSIS — M6281 Muscle weakness (generalized): Secondary | ICD-10-CM | POA: Diagnosis not present

## 2019-01-03 DIAGNOSIS — S42202D Unspecified fracture of upper end of left humerus, subsequent encounter for fracture with routine healing: Secondary | ICD-10-CM | POA: Diagnosis not present

## 2019-01-04 ENCOUNTER — Encounter: Payer: Self-pay | Admitting: Internal Medicine

## 2019-01-05 DIAGNOSIS — M6281 Muscle weakness (generalized): Secondary | ICD-10-CM | POA: Diagnosis not present

## 2019-01-05 DIAGNOSIS — M25512 Pain in left shoulder: Secondary | ICD-10-CM | POA: Diagnosis not present

## 2019-01-05 DIAGNOSIS — S42202D Unspecified fracture of upper end of left humerus, subsequent encounter for fracture with routine healing: Secondary | ICD-10-CM | POA: Diagnosis not present

## 2019-01-05 DIAGNOSIS — R293 Abnormal posture: Secondary | ICD-10-CM | POA: Diagnosis not present

## 2019-01-05 DIAGNOSIS — M25612 Stiffness of left shoulder, not elsewhere classified: Secondary | ICD-10-CM | POA: Diagnosis not present

## 2019-01-07 ENCOUNTER — Other Ambulatory Visit: Payer: Self-pay | Admitting: Internal Medicine

## 2019-01-11 DIAGNOSIS — M25512 Pain in left shoulder: Secondary | ICD-10-CM | POA: Diagnosis not present

## 2019-01-11 DIAGNOSIS — M6281 Muscle weakness (generalized): Secondary | ICD-10-CM | POA: Diagnosis not present

## 2019-01-11 DIAGNOSIS — S42202D Unspecified fracture of upper end of left humerus, subsequent encounter for fracture with routine healing: Secondary | ICD-10-CM | POA: Diagnosis not present

## 2019-01-11 DIAGNOSIS — R293 Abnormal posture: Secondary | ICD-10-CM | POA: Diagnosis not present

## 2019-01-11 DIAGNOSIS — M25612 Stiffness of left shoulder, not elsewhere classified: Secondary | ICD-10-CM | POA: Diagnosis not present

## 2019-01-13 DIAGNOSIS — S42202D Unspecified fracture of upper end of left humerus, subsequent encounter for fracture with routine healing: Secondary | ICD-10-CM | POA: Diagnosis not present

## 2019-01-13 DIAGNOSIS — R293 Abnormal posture: Secondary | ICD-10-CM | POA: Diagnosis not present

## 2019-01-13 DIAGNOSIS — M25612 Stiffness of left shoulder, not elsewhere classified: Secondary | ICD-10-CM | POA: Diagnosis not present

## 2019-01-13 DIAGNOSIS — M25512 Pain in left shoulder: Secondary | ICD-10-CM | POA: Diagnosis not present

## 2019-01-13 DIAGNOSIS — M6281 Muscle weakness (generalized): Secondary | ICD-10-CM | POA: Diagnosis not present

## 2019-01-17 DIAGNOSIS — M25512 Pain in left shoulder: Secondary | ICD-10-CM | POA: Diagnosis not present

## 2019-01-17 DIAGNOSIS — M25612 Stiffness of left shoulder, not elsewhere classified: Secondary | ICD-10-CM | POA: Diagnosis not present

## 2019-01-17 DIAGNOSIS — S42202D Unspecified fracture of upper end of left humerus, subsequent encounter for fracture with routine healing: Secondary | ICD-10-CM | POA: Diagnosis not present

## 2019-01-17 DIAGNOSIS — M6281 Muscle weakness (generalized): Secondary | ICD-10-CM | POA: Diagnosis not present

## 2019-01-17 DIAGNOSIS — R293 Abnormal posture: Secondary | ICD-10-CM | POA: Diagnosis not present

## 2019-01-18 ENCOUNTER — Other Ambulatory Visit: Payer: Self-pay

## 2019-01-18 ENCOUNTER — Ambulatory Visit (INDEPENDENT_AMBULATORY_CARE_PROVIDER_SITE_OTHER): Payer: Medicare Other | Admitting: Orthopaedic Surgery

## 2019-01-18 ENCOUNTER — Encounter: Payer: Self-pay | Admitting: Orthopaedic Surgery

## 2019-01-18 ENCOUNTER — Ambulatory Visit: Payer: Self-pay

## 2019-01-18 DIAGNOSIS — S42295D Other nondisplaced fracture of upper end of left humerus, subsequent encounter for fracture with routine healing: Secondary | ICD-10-CM

## 2019-01-18 NOTE — Progress Notes (Signed)
Patient ID: Amanda Arnold, female   DOB: [birth year], 73 y.o.   MRN: [id number]  [name] is a week status post nondisplaced left proximal humerus fracture.  She has begun physical therapy and doing well.  Amazed by her passive and active range of motion.  I think she is going to do quite well from this.  Her x-rays demonstrate bony consolidation healing.  At this point she has improved significantly and I am optimistic that she will regain close to 100% of her range of motion prior to the injury.  At this point we will go to release her to outpatient physical therapy.  We will see her back as needed.

## 2019-01-20 DIAGNOSIS — C44319 Basal cell carcinoma of skin of other parts of face: Secondary | ICD-10-CM | POA: Diagnosis not present

## 2019-01-20 DIAGNOSIS — C4401 Basal cell carcinoma of skin of lip: Secondary | ICD-10-CM | POA: Diagnosis not present

## 2019-01-25 DIAGNOSIS — M25612 Stiffness of left shoulder, not elsewhere classified: Secondary | ICD-10-CM | POA: Diagnosis not present

## 2019-01-25 DIAGNOSIS — S42202D Unspecified fracture of upper end of left humerus, subsequent encounter for fracture with routine healing: Secondary | ICD-10-CM | POA: Diagnosis not present

## 2019-01-25 DIAGNOSIS — M25512 Pain in left shoulder: Secondary | ICD-10-CM | POA: Diagnosis not present

## 2019-01-25 DIAGNOSIS — M6281 Muscle weakness (generalized): Secondary | ICD-10-CM | POA: Diagnosis not present

## 2019-01-25 DIAGNOSIS — R293 Abnormal posture: Secondary | ICD-10-CM | POA: Diagnosis not present

## 2019-01-27 ENCOUNTER — Ambulatory Visit (INDEPENDENT_AMBULATORY_CARE_PROVIDER_SITE_OTHER): Payer: Medicare Other | Admitting: Internal Medicine

## 2019-01-27 ENCOUNTER — Other Ambulatory Visit: Payer: Self-pay

## 2019-01-27 ENCOUNTER — Encounter: Payer: Self-pay | Admitting: Internal Medicine

## 2019-01-27 DIAGNOSIS — G8929 Other chronic pain: Secondary | ICD-10-CM

## 2019-01-27 DIAGNOSIS — M5440 Lumbago with sciatica, unspecified side: Secondary | ICD-10-CM

## 2019-01-27 DIAGNOSIS — F419 Anxiety disorder, unspecified: Secondary | ICD-10-CM

## 2019-01-27 DIAGNOSIS — I10 Essential (primary) hypertension: Secondary | ICD-10-CM | POA: Diagnosis not present

## 2019-01-27 DIAGNOSIS — E119 Type 2 diabetes mellitus without complications: Secondary | ICD-10-CM | POA: Diagnosis not present

## 2019-01-27 DIAGNOSIS — S42215S Unspecified nondisplaced fracture of surgical neck of left humerus, sequela: Secondary | ICD-10-CM | POA: Diagnosis not present

## 2019-01-27 DIAGNOSIS — F32A Depression, unspecified: Secondary | ICD-10-CM

## 2019-01-27 DIAGNOSIS — S42309A Unspecified fracture of shaft of humerus, unspecified arm, initial encounter for closed fracture: Secondary | ICD-10-CM | POA: Insufficient documentation

## 2019-01-27 DIAGNOSIS — F329 Major depressive disorder, single episode, unspecified: Secondary | ICD-10-CM

## 2019-01-27 MED ORDER — HYDROCODONE-ACETAMINOPHEN 7.5-325 MG PO TABS: 1.0000 | ORAL_TABLET | Freq: Three times a day (TID) | ORAL | 0 refills | Status: DC | PRN

## 2019-01-27 NOTE — Assessment & Plan Note (Signed)
Xanax prn  Potential benefits of a long term benzodiazepines  use as well as potential risks  and complications were explained to the patient and were aknowledged. 

## 2019-01-27 NOTE — Assessment & Plan Note (Signed)
Wt loss  

## 2019-01-27 NOTE — Assessment & Plan Note (Signed)
On Toprol XL

## 2019-01-27 NOTE — Assessment & Plan Note (Signed)
L humeral neck fracture  In PT

## 2019-01-27 NOTE — Progress Notes (Signed)
Subjective:  Patient ID: [name], female    DOB: [birth year]  Age: 73 y.o. MRN: [id number]  CC: No chief complaint on file.   HPI KYESHA BALLA presents for a L humeral neck fracture 5/20, skin cancer on face. C/o HA, fatigue, LBP.  Outpatient Medications Prior to Visit  Medication Sig Dispense Refill   ALPRAZolam (XANAX) 0.5 MG tablet TAKE 1 TABLET(0.5 MG) BY MOUTH TWICE DAILY AS NEEDED FOR ANXIETY (Patient taking differently: Take 0.5 mg by mouth 2 (two) times daily as needed for anxiety. ) 60 tablet 2   Cholecalciferol (VITAMIN D3) 2000 UNITS capsule Take 1 capsule (2,000 Units total) by mouth daily. 100 capsule 3   diazepam (VALIUM) 2 MG tablet Take 1 tablet (2 mg total) by mouth every 6 (six) hours as needed for muscle spasms. 15 tablet 0   gabapentin (NEURONTIN) 300 MG capsule Take 1 capsule (300 mg total) by mouth 3 (three) times daily. 90 capsule 5   HYDROcodone-acetaminophen (NORCO) 7.5-325 MG tablet Take 1 tablet by mouth every 8 (eight) hours as needed for severe pain. 90 tablet 0   ibuprofen (ADVIL) 200 MG tablet Take 600 mg by mouth every 6 (six) hours as needed for moderate pain.     ketoconazole (NIZORAL) 2 % cream Apply 1 application topically 2 (two) times daily. In the groin 60 g 1   meclizine (ANTIVERT) 12.5 MG tablet Take 1-2 tablets (12.5-25 mg total) by mouth 3 (three) times daily as needed for dizziness. 60 tablet 1   metoprolol succinate (TOPROL-XL) 25 MG 24 hr tablet TAKE 1/2 TABLET(12.5 MG) BY MOUTH AT BEDTIME 45 tablet 3   pramipexole (MIRAPEX) 0.125 MG tablet Take 1-2 tablets (0.125-0.25 mg total) by mouth at bedtime. 60 tablet 5   umeclidinium-vilanterol (ANORO ELLIPTA) 62.5-25 MCG/INH AEPB Inhale 1 puff into the lungs daily. 1 each 11   No facility-administered medications prior to visit.     ROS: Review of Systems  Constitutional: Positive for fatigue. Negative for activity change, appetite change, chills and unexpected weight change.   HENT: Negative for congestion, mouth sores and sinus pressure.   Eyes: Negative for visual disturbance.  Respiratory: Negative for cough and chest tightness.   Gastrointestinal: Negative for abdominal pain and nausea.  Genitourinary: Negative for difficulty urinating, frequency and vaginal pain.  Musculoskeletal: Positive for arthralgias and back pain. Negative for gait problem.  Skin: Negative for pallor and rash.  Neurological: Positive for headaches. Negative for dizziness, tremors, syncope, weakness and numbness.  Psychiatric/Behavioral: Negative for confusion and sleep disturbance.    Objective:  BP 124/72 (BP Location: Right Arm, Patient Position: Sitting, Cuff Size: Large)    Pulse 75    Temp 98.5 F (36.9 C) (Oral)    Ht 5\' 2"  (1.575 m)    Wt 194 lb (88 kg)    SpO2 97%    BMI 35.48 kg/m   BP Readings from Last 3 Encounters:  01/27/19 124/72  11/24/18 130/83  10/28/18 124/68    Wt Readings from Last 3 Encounters:  01/27/19 194 lb (88 kg)  11/24/18 196 lb (88.9 kg)  10/28/18 197 lb (89.4 kg)    Physical Exam Constitutional:      General: She is not in acute distress.    Appearance: She is well-developed. She is obese.  HENT:     Head: Normocephalic.     Right Ear: External ear normal.     Left Ear: External ear normal.     Nose: Nose  normal.  Eyes:     General:        Right eye: No discharge.        Left eye: No discharge.     Conjunctiva/sclera: Conjunctivae normal.     Pupils: Pupils are equal, round, and reactive to light.  Neck:     Musculoskeletal: Normal range of motion and neck supple.     Thyroid: No thyromegaly.     Vascular: No JVD.     Trachea: No tracheal deviation.  Cardiovascular:     Rate and Rhythm: Normal rate and regular rhythm.     Heart sounds: Normal heart sounds.  Pulmonary:     Effort: No respiratory distress.     Breath sounds: No stridor. No wheezing.  Abdominal:     General: Bowel sounds are normal. There is no distension.      Palpations: Abdomen is soft. There is no mass.     Tenderness: There is no abdominal tenderness. There is no guarding or rebound.  Musculoskeletal:        General: Tenderness present.  Lymphadenopathy:     Cervical: No cervical adenopathy.  Skin:    Findings: No erythema or rash.  Neurological:     Cranial Nerves: No cranial nerve deficit.     Motor: No abnormal muscle tone.     Coordination: Coordination normal.     Deep Tendon Reflexes: Reflexes normal.  Psychiatric:        Behavior: Behavior normal.        Thought Content: Thought content normal.        Judgment: Judgment normal.   LS spine, L shoulder - tender w/ROM  Lab Results  Component Value Date   WBC 6.8 08/10/2018   HGB 13.6 08/10/2018   HCT 40.8 08/10/2018   PLT 190.0 08/10/2018   GLUCOSE 101 (H) 10/28/2018   CHOL 167 09/17/2017   TRIG 92.0 09/17/2017   HDL 53.00 09/17/2017   LDLCALC 96 09/17/2017   ALT 28 08/10/2018   AST 25 08/10/2018   NA 140 10/28/2018   K 4.2 10/28/2018   CL 105 10/28/2018   CREATININE 0.68 10/28/2018   BUN 21 10/28/2018   CO2 27 10/28/2018   TSH 1.50 09/17/2017   HGBA1C 6.3 10/28/2018    Dg Shoulder Left  Result Date: 11/24/2018 CLINICAL DATA:  Fall. Pain. EXAM: LEFT SHOULDER - 2+ VIEW COMPARISON:  None. FINDINGS: There is a mildly displaced, comminuted, and transverse fracture across the surgical neck of the humerus, with slight displacement greater tuberosity. No glenohumeral dislocation. Osteopenia. No adjacent rib fracture or pneumothorax. Degenerative change AC joint. IMPRESSION: Comminuted, and transverse fracture across the surgical neck of the humerus, with slight displacement. Electronically Signed   By: Staci Righter M.D.   On: 11/24/2018 11:42    Assessment & Plan:   There are no diagnoses linked to this encounter.   No orders of the defined types were placed in this encounter.    Follow-up: No follow-ups on file.  Walker Kehr, MD

## 2019-01-27 NOTE — Patient Instructions (Signed)
  These suggestions will probably help you to lose weight: 1.  Reduce your consumption of sugars and starches.  Eliminate high fructose corn syrup from your diet.  Reduce your consumption of processed foods.  For desserts try to have seasonal fruits, berries with with green, knots, cheeses or dark chocolate with more than 70% cacao. 2.  Do not snack 3.  You do not have to eat breakfast.  If you choose to have breakfast-eat plain greek yogurt, eggs, oatmeal (without sugar) 4.  Drink water, freshly brewed unsweetened tea (green, black or herbal) or coffee.  Do not drink sodas including diet sodas , juices, beverages sweetened with artificial sweeteners. 5.  Reduce your consumption of refined grains. 6.  Avoid protein drinks such as Optifast, Slim fast etc. Eat chicken, fish, meat, dairy and beans for your sources of protein 7.  Natural unprocessed fats like cold pressed virgin olive oil, butter, coconut oil are good for you.  Eat avocados 8.  Increase your consumption of fiber.  Fruits, berries, vegetables, whole grains, flaxseeds, Chia seeds, beans, popcorn, nuts, oatmeal are good sources of fiber 9.  Use vinegar in your diet, i.e. apple cider vinegar, red wine or balsamic vinegar 10.  You can try fasting.  For example you can skip breakfast and lunch every other day (24-hour fast)    Mediterranean diet is good for you.  If you drink alcohol, limit your alcohol intake to no more than 2 drinks a day. The Mediterranean diet is a way of eating based on the traditional cuisine of countries bordering the [employer]. While there is no single definition of the Mediterranean diet, it is typically high in vegetables, fruits, whole grains, beans, nut and seeds, and olive oil. The main components of Mediterranean diet include: Marland Kitchen Daily consumption of vegetables, fruits, whole grains and healthy fats  . Weekly intake of fish, poultry, beans and eggs  . Moderate portions of dairy products  . Limited  intake of red meat Other important elements of the Mediterranean diet are sharing meals with family and friends, enjoying a glass of red wine and being physically active.  Cabbage soup recipe for losing weight: Take 1 small head of CABG, 1 average pack of celery, 4 green peppers, 4 onions, 2 cans diced tomatoes, salt and spices to taste.  Chop cabbage, celery, peppers and onions.  And tomatoes and 2-2.5 liters (2.5 quarts) of water so that it would just cover the vegetables.  Bring to boil.  Add spices and salt.  Turn heat to low/medium and simmer for 20-25 minutes.

## 2019-01-27 NOTE — Assessment & Plan Note (Signed)
Chronic MSK/OA, spinal stenosis Norco prn, Gabapentin  Potential benefits of a long term opioids use as well as potential risks (i.e. addiction risk, apnea etc) and complications (i.e. Somnolence, constipation and others) were explained to the patient and were aknowledged.

## 2019-01-31 DIAGNOSIS — R293 Abnormal posture: Secondary | ICD-10-CM | POA: Diagnosis not present

## 2019-01-31 DIAGNOSIS — M25512 Pain in left shoulder: Secondary | ICD-10-CM | POA: Diagnosis not present

## 2019-01-31 DIAGNOSIS — M6281 Muscle weakness (generalized): Secondary | ICD-10-CM | POA: Diagnosis not present

## 2019-01-31 DIAGNOSIS — S42202D Unspecified fracture of upper end of left humerus, subsequent encounter for fracture with routine healing: Secondary | ICD-10-CM | POA: Diagnosis not present

## 2019-01-31 DIAGNOSIS — M25612 Stiffness of left shoulder, not elsewhere classified: Secondary | ICD-10-CM | POA: Diagnosis not present

## 2019-02-03 DIAGNOSIS — M25612 Stiffness of left shoulder, not elsewhere classified: Secondary | ICD-10-CM | POA: Diagnosis not present

## 2019-02-03 DIAGNOSIS — M25512 Pain in left shoulder: Secondary | ICD-10-CM | POA: Diagnosis not present

## 2019-02-03 DIAGNOSIS — S42202D Unspecified fracture of upper end of left humerus, subsequent encounter for fracture with routine healing: Secondary | ICD-10-CM | POA: Diagnosis not present

## 2019-02-03 DIAGNOSIS — M6281 Muscle weakness (generalized): Secondary | ICD-10-CM | POA: Diagnosis not present

## 2019-02-03 DIAGNOSIS — R293 Abnormal posture: Secondary | ICD-10-CM | POA: Diagnosis not present

## 2019-02-04 DIAGNOSIS — Z13820 Encounter for screening for osteoporosis: Secondary | ICD-10-CM | POA: Diagnosis not present

## 2019-02-08 DIAGNOSIS — S42202D Unspecified fracture of upper end of left humerus, subsequent encounter for fracture with routine healing: Secondary | ICD-10-CM | POA: Diagnosis not present

## 2019-02-08 DIAGNOSIS — M25612 Stiffness of left shoulder, not elsewhere classified: Secondary | ICD-10-CM | POA: Diagnosis not present

## 2019-02-08 DIAGNOSIS — R293 Abnormal posture: Secondary | ICD-10-CM | POA: Diagnosis not present

## 2019-02-08 DIAGNOSIS — M25512 Pain in left shoulder: Secondary | ICD-10-CM | POA: Diagnosis not present

## 2019-02-08 DIAGNOSIS — M6281 Muscle weakness (generalized): Secondary | ICD-10-CM | POA: Diagnosis not present

## 2019-02-15 DIAGNOSIS — M6281 Muscle weakness (generalized): Secondary | ICD-10-CM | POA: Diagnosis not present

## 2019-02-15 DIAGNOSIS — M25612 Stiffness of left shoulder, not elsewhere classified: Secondary | ICD-10-CM | POA: Diagnosis not present

## 2019-02-15 DIAGNOSIS — M25512 Pain in left shoulder: Secondary | ICD-10-CM | POA: Diagnosis not present

## 2019-02-15 DIAGNOSIS — R293 Abnormal posture: Secondary | ICD-10-CM | POA: Diagnosis not present

## 2019-02-15 DIAGNOSIS — S42202D Unspecified fracture of upper end of left humerus, subsequent encounter for fracture with routine healing: Secondary | ICD-10-CM | POA: Diagnosis not present

## 2019-03-29 ENCOUNTER — Encounter: Payer: Self-pay | Admitting: Internal Medicine

## 2019-03-29 ENCOUNTER — Other Ambulatory Visit: Payer: Self-pay

## 2019-03-29 ENCOUNTER — Ambulatory Visit (INDEPENDENT_AMBULATORY_CARE_PROVIDER_SITE_OTHER): Payer: Medicaid Other | Admitting: Internal Medicine

## 2019-03-29 DIAGNOSIS — I10 Essential (primary) hypertension: Secondary | ICD-10-CM | POA: Diagnosis not present

## 2019-03-29 DIAGNOSIS — G5701 Lesion of sciatic nerve, right lower limb: Secondary | ICD-10-CM

## 2019-03-29 DIAGNOSIS — E119 Type 2 diabetes mellitus without complications: Secondary | ICD-10-CM

## 2019-03-29 DIAGNOSIS — G5700 Lesion of sciatic nerve, unspecified lower limb: Secondary | ICD-10-CM | POA: Insufficient documentation

## 2019-03-29 NOTE — Assessment & Plan Note (Signed)
No change 

## 2019-03-29 NOTE — Progress Notes (Signed)
Subjective:  Patient ID: Amanda Arnold, female    DOB: 01/28/1946  Age: 73 y.o. MRN: [id number]  CC: No chief complaint on file.   HPI [name] presents for a new severe R buttock/RLE pain x 1 week. It started for no reason. Wose w/walking..  standing F/u DM, HTN  Outpatient Medications Prior to Visit  Medication Sig Dispense Refill  . ALPRAZolam (XANAX) 0.5 MG tablet TAKE 1 TABLET(0.5 MG) BY MOUTH TWICE DAILY AS NEEDED FOR ANXIETY (Patient taking differently: Take 0.5 mg by mouth 2 (two) times daily as needed for anxiety. ) 60 tablet 2  . Cholecalciferol (VITAMIN D3) 2000 UNITS capsule Take 1 capsule (2,000 Units total) by mouth daily. 100 capsule 3  . diazepam (VALIUM) 2 MG tablet Take 1 tablet (2 mg total) by mouth every 6 (six) hours as needed for muscle spasms. 15 tablet 0  . gabapentin (NEURONTIN) 300 MG capsule Take 1 capsule (300 mg total) by mouth 3 (three) times daily. 90 capsule 5  . HYDROcodone-acetaminophen (NORCO) 7.5-325 MG tablet Take 1 tablet by mouth every 8 (eight) hours as needed for severe pain. 90 tablet 0  . ibuprofen (ADVIL) 200 MG tablet Take 600 mg by mouth every 6 (six) hours as needed for moderate pain.    Marland Kitchen ketoconazole (NIZORAL) 2 % cream Apply 1 application topically 2 (two) times daily. In the groin 60 g 1  . meclizine (ANTIVERT) 12.5 MG tablet Take 1-2 tablets (12.5-25 mg total) by mouth 3 (three) times daily as needed for dizziness. 60 tablet 1  . metoprolol succinate (TOPROL-XL) 25 MG 24 hr tablet TAKE 1/2 TABLET(12.5 MG) BY MOUTH AT BEDTIME 45 tablet 3  . pramipexole (MIRAPEX) 0.125 MG tablet Take 1-2 tablets (0.125-0.25 mg total) by mouth at bedtime. 60 tablet 5  . umeclidinium-vilanterol (ANORO ELLIPTA) 62.5-25 MCG/INH AEPB Inhale 1 puff into the lungs daily. 1 each 11   No facility-administered medications prior to visit.     ROS: Review of Systems  Constitutional: Positive for fatigue. Negative for activity change, appetite change,  chills and unexpected weight change.  HENT: Negative for congestion, mouth sores and sinus pressure.   Eyes: Negative for visual disturbance.  Respiratory: Negative for cough and chest tightness.   Gastrointestinal: Negative for abdominal pain and nausea.  Genitourinary: Negative for difficulty urinating, frequency and vaginal pain.  Musculoskeletal: Positive for arthralgias, back pain and gait problem.  Skin: Negative for pallor and rash.  Neurological: Negative for dizziness, tremors, weakness, numbness and headaches.  Psychiatric/Behavioral: Negative for confusion and sleep disturbance.    Objective:  BP 134/76 (BP Location: Left Arm, Patient Position: Sitting, Cuff Size: Large)   Pulse 64   Temp 98.5 F (36.9 C) (Oral)   Ht 5\' 2"  (1.575 m)   Wt 196 lb (88.9 kg)   SpO2 97%   BMI 35.85 kg/m   BP Readings from Last 3 Encounters:  03/29/19 134/76  01/27/19 124/72  11/24/18 130/83    Wt Readings from Last 3 Encounters:  03/29/19 196 lb (88.9 kg)  01/27/19 194 lb (88 kg)  11/24/18 196 lb (88.9 kg)    Physical Exam Constitutional:      General: She is not in acute distress.    Appearance: She is well-developed.  HENT:     Head: Normocephalic.     Right Ear: External ear normal.     Left Ear: External ear normal.     Nose: Nose normal.  Eyes:  General:        Right eye: No discharge.        Left eye: No discharge.     Conjunctiva/sclera: Conjunctivae normal.     Pupils: Pupils are equal, round, and reactive to light.  Neck:     Musculoskeletal: Normal range of motion and neck supple.     Thyroid: No thyromegaly.     Vascular: No JVD.     Trachea: No tracheal deviation.  Cardiovascular:     Rate and Rhythm: Normal rate and regular rhythm.     Heart sounds: Normal heart sounds.  Pulmonary:     Effort: No respiratory distress.     Breath sounds: No stridor. No wheezing.  Abdominal:     General: Bowel sounds are normal. There is no distension.     Palpations:  Abdomen is soft. There is no mass.     Tenderness: There is no abdominal tenderness. There is no guarding or rebound.  Musculoskeletal:        General: Tenderness present.  Lymphadenopathy:     Cervical: No cervical adenopathy.  Skin:    Findings: No erythema or rash.  Neurological:     Mental Status: She is oriented to person, place, and time.     Cranial Nerves: No cranial nerve deficit.     Motor: No abnormal muscle tone.     Coordination: Coordination abnormal.     Gait: Gait abnormal.     Deep Tendon Reflexes: Reflexes normal.  Psychiatric:        Behavior: Behavior normal.        Thought Content: Thought content normal.        Judgment: Judgment normal.   R central buttock - very tender  Lab Results  Component Value Date   WBC 6.8 08/10/2018   HGB 13.6 08/10/2018   HCT 40.8 08/10/2018   PLT 190.0 08/10/2018   GLUCOSE 101 (H) 10/28/2018   CHOL 167 09/17/2017   TRIG 92.0 09/17/2017   HDL 53.00 09/17/2017   LDLCALC 96 09/17/2017   ALT 28 08/10/2018   AST 25 08/10/2018   NA 140 10/28/2018   K 4.2 10/28/2018   CL 105 10/28/2018   CREATININE 0.68 10/28/2018   BUN 21 10/28/2018   CO2 27 10/28/2018   TSH 1.50 09/17/2017   HGBA1C 6.3 10/28/2018    Dg Shoulder Left  Result Date: 11/24/2018 CLINICAL DATA:  Fall. Pain. EXAM: LEFT SHOULDER - 2+ VIEW COMPARISON:  None. FINDINGS: There is a mildly displaced, comminuted, and transverse fracture across the surgical neck of the humerus, with slight displacement greater tuberosity. No glenohumeral dislocation. Osteopenia. No adjacent rib fracture or pneumothorax. Degenerative change AC joint. IMPRESSION: Comminuted, and transverse fracture across the surgical neck of the humerus, with slight displacement. Electronically Signed   By: Staci Righter M.D.   On: 11/24/2018 11:42    Assessment & Plan:   There are no diagnoses linked to this encounter.   No orders of the defined types were placed in this encounter.     Follow-up: No follow-ups on file.  Walker Kehr, MD

## 2019-03-29 NOTE — Assessment & Plan Note (Signed)
Toprol XL BP Readings from Last 3 Encounters:  03/29/19 134/76  01/27/19 124/72  11/24/18 130/83

## 2019-03-29 NOTE — Patient Instructions (Signed)
Heat - rice sock Massage/exercises   Piriformis Syndrome  Piriformis syndrome is a condition that can cause pain and numbness in your buttocks and down the back of your leg. Piriformis syndrome happens when the small muscle that connects the base of your spine to your hip (piriformis muscle) presses on the nerve that runs down the back of your leg (sciatic nerve). The piriformis muscle helps your hip rotate and helps to bring your leg back and out. It also helps shift your weight to keep you stable while you are walking. The sciatic nerve runs under or through the piriformis muscle. Damage to the piriformis muscle can cause spasms that put pressure on the nerve below. This causes pain and discomfort while sitting and moving. The pain may feel as if it begins in the buttock and spreads (radiates) down your hip and thigh. What are the causes? This condition is caused by pressure on the sciatic nerve from the piriformis muscle. The piriformis muscle can get irritated with overuse, especially if other hip muscles are weak and the piriformis muscle has to do extra work. Piriformis syndrome can also occur after an injury, like a fall onto your buttocks. What increases the risk? You are more likely to develop this condition if you:  Are a woman.  Sit for long periods of time.  Are a cyclist.  Have weak buttocks muscles (gluteal muscles). What are the signs or symptoms? Symptoms of this condition include:  Pain, tingling, or numbness that starts in the buttock and runs down the back of your leg (sciatica).  Pain in the groin or thigh area. Your symptoms may get worse:  The longer you sit.  When you walk, run, or climb stairs.  When straining to have a bowel movement. How is this diagnosed? This condition is diagnosed based on your symptoms, medical history, and physical exam.  During the exam, your health care provider may: ? Move your leg into different positions to check for pain. ?  Press on the muscles of your hip and buttock to see if that increases your symptoms.  You may also have tests, including: ? Imaging tests such as X-rays, MRI, or ultrasound. ? Electromyogram (EMG). This test measures electrical signals sent by your nerves into the muscles. ? Nerve conduction study. This test measures how well electrical signals pass through your nerves. How is this treated? This condition may be treated by:  Stopping all activities that cause pain or make your condition worse.  Applying ice or using heat therapy.  Taking medicines to reduce pain and swelling.  Taking a muscle relaxer (muscle relaxant) to stop muscle spasms.  Doing range-of-motion and strengthening exercises (physical therapy) as told by your health care provider.  Massaging the area.  Having acupuncture.  Getting an injection of medicine in the piriformis muscle. Your health care provider will choose the medicine based on your condition. He or she may inject: ? An anti-inflammatory medicine (steroid) to reduce swelling. ? A numbing medicine (local anesthetic) to block the pain. ? Botulinum toxin. The toxin blocks nerve impulses to specific muscles to reduce muscle tension. In rare cases, you may need surgery to cut the muscle and release pressure on the nerve if other treatments do not work. Follow these instructions at home: Activity  Do not sit for long periods. Get up and walk around every 20 minutes or as often as told by your health care provider. ? When driving long distances, make sure to take frequent stops to  get up and stretch.  Use a cushion when you sit on hard surfaces.  Do exercises as told by your health care provider.  Return to your normal activities as told by your health care provider. Ask your health care provider what activities are safe for you. Managing pain, stiffness, and swelling      If directed, apply heat to the affected area as often as told by your health  care provider. Use the heat source that your health care provider recommends, such as a moist heat pack or a heating pad. ? Place a towel between your skin and the heat source. ? Leave the heat on for 20-30 minutes. ? Remove the heat if your skin turns bright red. This is especially important if you are unable to feel pain, heat, or cold. You may have a greater risk of getting burned.  If directed, put ice on the injured area. ? Put ice in a plastic bag. ? Place a towel between your skin and the bag. ? Leave the ice on for 20 minutes, 2-3 times a day. General instructions  Take over-the-counter and prescription medicines only as told by your health care provider.  Ask your health care provider if the medicine prescribed to you requires you to avoid driving or using heavy machinery.  You may need to take actions to prevent or treat constipation, such as: ? Drink enough fluid to keep your urine pale yellow. ? Take over-the-counter or prescription medicines. ? Eat foods that are high in fiber, such as beans, whole grains, and fresh fruits and vegetables. ? Limit foods that are high in fat and processed sugars, such as fried or sweet foods.  Keep all follow-up visits as told by your health care provider. This is important. How is this prevented?  Do not sit for longer than 20 minutes at a time. When you sit, choose padded surfaces.  Warm up and stretch before being active.  Cool down and stretch after being active.  Give your body time to rest between periods of activity.  Make sure to use equipment that fits you.  Maintain physical fitness, including: ? Strength. ? Flexibility. Contact a health care provider if:  Your pain and stiffness continue or get worse.  Your leg or hip becomes weak.  You have changes in your bowel function or bladder function. Summary  Piriformis syndrome is a condition that can cause pain, tingling, and numbness in your buttocks and down the back of  your leg.  You may try applying heat or ice to relieve the pain.  Do not sit for long periods. Get up and walk around every 20 minutes or as often as told by your health care provider. This information is not intended to replace advice given to you by your health care provider. Make sure you discuss any questions you have with your health care provider. Document Released: 06/30/2005 Document Revised: 10/21/2018 Document Reviewed: 02/24/2018 Elsevier Patient Education  2020 [employer].   Piriformis Syndrome Rehab Ask your health care provider which exercises are safe for you. Do exercises exactly as told by your health care provider and adjust them as directed. It is normal to feel mild stretching, pulling, tightness, or discomfort as you do these exercises. Stop right away if you feel sudden pain or your pain gets worse. Do not begin these exercises until told by your health care provider. Stretching and range-of-motion exercises These exercises warm up your muscles and joints and improve the movement and flexibility  of your hip and pelvis. The exercises also help to relieve pain, numbness, and tingling. Hip rotation This is an exercise in which you lie on your back and stretch the muscles that rotate your hip (hip rotators) to stretch your buttocks. 1. Lie on your back on a firm surface. 2. Pull your left / right knee toward your same shoulder with your left / right hand until your knee is pointing toward the ceiling. Hold your left / right ankle with your other hand. 3. Keeping your knee steady, gently pull your left / right ankle toward your other shoulder until you feel a stretch in your buttocks. 4. Hold this position for __________ seconds. Repeat __________ times. Complete this exercise __________ times a day. Hip extensor This is an exercise in which you lie on your back and pull your knee to your chest. 1. Lie on your back on a firm surface. Both of your legs should be straight.  2. Pull your left / right knee to your chest. Hold your leg in this position by holding onto the back of your thigh or the front of your knee. 3. Hold this position for __________ seconds. 4. Slowly return to the starting position. Repeat __________ times. Complete this exercise __________ times a day. Strengthening exercises These exercises build strength and endurance in your hip and thigh muscles. Endurance is the ability to use your muscles for a long time, even after they get tired. Straight leg raises, side-lying This exercise strengthens the muscles that rotate the leg at the hip and move it away from your body (hip abductors). 1. Lie on your side with your left / right leg in the top position. Lie so your head, shoulder, knee, and hip line up. Bend your bottom knee to help you balance. 2. Lift your top leg 4-6 inches (10-15 cm) while keeping your toes pointed straight ahead. 3. Hold this position for __________ seconds. 4. Slowly lower your leg to the starting position. 5. Let your muscles relax completely after each repetition. Repeat __________ times. Complete this exercise __________ times a day. Hip abduction and rotation This is sometimes called quadruped (on hands and knees) exercises. 1. Get on your hands and knees on a firm, lightly padded surface. Your hands should be directly below your shoulders, and your knees should be directly below your hips. 2. Lift your left / right knee out to the side. Keep your knee bent. Do not twist your body. 3. Hold this position for __________ seconds. 4. Slowly lower your leg. Repeat __________ times. Complete this exercise __________ times a day. Straight leg raises, face-down This exercise stretches the muscles that move your hips away from the front of the pelvis (hip extensors). 1. Lie on your abdomen on a bed or a firm surface with a pillow under your hips. 2. Squeeze your buttocks muscles and lift your left / right leg about 4-6 inches  (10-15 cm) off the bed. Do not let your back arch. 3. Hold this position for __________ seconds. 4. Slowly lower your leg to the starting position. 5. Let your muscles relax completely after each repetition. Repeat __________ times. Complete this exercise __________ times a day. This information is not intended to replace advice given to you by your health care provider. Make sure you discuss any questions you have with your health care provider. Document Released: 06/30/2005 Document Revised: 10/21/2018 Document Reviewed: 04/22/2018 Elsevier Patient Education  2020 [employer].

## 2019-03-29 NOTE — Assessment & Plan Note (Signed)
Heat - rice sock Massage/exercises

## 2019-04-12 DIAGNOSIS — C4401 Basal cell carcinoma of skin of lip: Secondary | ICD-10-CM | POA: Diagnosis not present

## 2019-04-18 DIAGNOSIS — L720 Epidermal cyst: Secondary | ICD-10-CM | POA: Diagnosis not present

## 2019-04-28 ENCOUNTER — Encounter: Payer: Self-pay | Admitting: Internal Medicine

## 2019-04-28 ENCOUNTER — Other Ambulatory Visit: Payer: Self-pay

## 2019-04-28 ENCOUNTER — Ambulatory Visit (INDEPENDENT_AMBULATORY_CARE_PROVIDER_SITE_OTHER): Payer: Medicaid Other | Admitting: Internal Medicine

## 2019-04-28 VITALS — BP 132/76 | HR 68 | Temp 98.5°F | Ht 62.0 in | Wt 194.0 lb

## 2019-04-28 DIAGNOSIS — I10 Essential (primary) hypertension: Secondary | ICD-10-CM | POA: Diagnosis not present

## 2019-04-28 DIAGNOSIS — Z23 Encounter for immunization: Secondary | ICD-10-CM

## 2019-04-28 DIAGNOSIS — M79605 Pain in left leg: Secondary | ICD-10-CM

## 2019-04-28 DIAGNOSIS — M79604 Pain in right leg: Secondary | ICD-10-CM | POA: Diagnosis not present

## 2019-04-28 DIAGNOSIS — E119 Type 2 diabetes mellitus without complications: Secondary | ICD-10-CM

## 2019-04-28 DIAGNOSIS — Z6836 Body mass index (BMI) 36.0-36.9, adult: Secondary | ICD-10-CM

## 2019-04-28 DIAGNOSIS — G2581 Restless legs syndrome: Secondary | ICD-10-CM

## 2019-04-28 MED ORDER — HYDROCODONE-ACETAMINOPHEN 7.5-325 MG PO TABS: 1.0000 | ORAL_TABLET | Freq: Three times a day (TID) | ORAL | 0 refills | Status: DC | PRN

## 2019-04-28 NOTE — Assessment & Plan Note (Signed)
Gabapentin

## 2019-04-28 NOTE — Assessment & Plan Note (Signed)
Toprol XL 

## 2019-04-28 NOTE — Assessment & Plan Note (Signed)
Wt Readings from Last 3 Encounters:  04/28/19 194 lb (88 kg)  03/29/19 196 lb (88.9 kg)  01/27/19 194 lb (88 kg)

## 2019-04-28 NOTE — Addendum Note (Signed)
Addended by: Karren Cobble on: 04/28/2019 04:08 PM   Modules accepted: Orders

## 2019-04-28 NOTE — Assessment & Plan Note (Addendum)
pain in the R groin on palpation - ?MSK strain B hip ROM - nl, NT Str leg elev (-) B   R>L  Norco prn  Potential benefits of a long term opioids use as well as potential risks (i.e. addiction risk, apnea etc) and complications (i.e. Somnolence, constipation and others) were explained to the patient and were aknowledged.

## 2019-04-28 NOTE — Progress Notes (Signed)
Subjective:  Patient ID: Amanda Arnold, female    DOB: Nov 17, 1945  Age: 73 y.o. MRN: [id number]  CC: No chief complaint on file.   HPI ALLEENE FUNEZ presents for LBP w/R leg irrad, RLS, anxiety.  Outpatient Medications Prior to Visit  Medication Sig Dispense Refill  . ALPRAZolam (XANAX) 0.5 MG tablet TAKE 1 TABLET(0.5 MG) BY MOUTH TWICE DAILY AS NEEDED FOR ANXIETY (Patient taking differently: Take 0.5 mg by mouth 2 (two) times daily as needed for anxiety. ) 60 tablet 2  . Cholecalciferol (VITAMIN D3) 2000 UNITS capsule Take 1 capsule (2,000 Units total) by mouth daily. 100 capsule 3  . diazepam (VALIUM) 2 MG tablet Take 1 tablet (2 mg total) by mouth every 6 (six) hours as needed for muscle spasms. 15 tablet 0  . gabapentin (NEURONTIN) 300 MG capsule Take 1 capsule (300 mg total) by mouth 3 (three) times daily. 90 capsule 5  . HYDROcodone-acetaminophen (NORCO) 7.5-325 MG tablet Take 1 tablet by mouth every 8 (eight) hours as needed for severe pain. 90 tablet 0  . ibuprofen (ADVIL) 200 MG tablet Take 600 mg by mouth every 6 (six) hours as needed for moderate pain.    Marland Kitchen ketoconazole (NIZORAL) 2 % cream Apply 1 application topically 2 (two) times daily. In the groin 60 g 1  . meclizine (ANTIVERT) 12.5 MG tablet Take 1-2 tablets (12.5-25 mg total) by mouth 3 (three) times daily as needed for dizziness. 60 tablet 1  . metoprolol succinate (TOPROL-XL) 25 MG 24 hr tablet TAKE 1/2 TABLET(12.5 MG) BY MOUTH AT BEDTIME 45 tablet 3  . pramipexole (MIRAPEX) 0.125 MG tablet Take 1-2 tablets (0.125-0.25 mg total) by mouth at bedtime. 60 tablet 5  . umeclidinium-vilanterol (ANORO ELLIPTA) 62.5-25 MCG/INH AEPB Inhale 1 puff into the lungs daily. 1 each 11   No facility-administered medications prior to visit.     ROS: Review of Systems  Constitutional: Positive for fatigue. Negative for activity change, appetite change, chills and unexpected weight change.  HENT: Negative for congestion, mouth  sores and sinus pressure.   Eyes: Negative for visual disturbance.  Respiratory: Negative for cough and chest tightness.   Gastrointestinal: Negative for abdominal pain and nausea.  Genitourinary: Negative for difficulty urinating, frequency and vaginal pain.  Musculoskeletal: Positive for back pain and gait problem.  Skin: Negative for pallor and rash.  Neurological: Negative for dizziness, tremors, weakness, numbness and headaches.  Psychiatric/Behavioral: Negative for confusion and sleep disturbance.    Objective:  BP 132/76 (BP Location: Right Arm, Patient Position: Sitting, Cuff Size: Large)   Pulse 68   Temp 98.5 F (36.9 C) (Oral)   Ht 5\' 2"  (1.575 m)   Wt 194 lb (88 kg)   SpO2 96%   BMI 35.48 kg/m   BP Readings from Last 3 Encounters:  04/28/19 132/76  03/29/19 134/76  01/27/19 124/72    Wt Readings from Last 3 Encounters:  04/28/19 194 lb (88 kg)  03/29/19 196 lb (88.9 kg)  01/27/19 194 lb (88 kg)    Physical Exam Constitutional:      General: She is not in acute distress.    Appearance: Normal appearance. She is well-developed. She is obese.  HENT:     Head: Normocephalic.     Right Ear: External ear normal.     Left Ear: External ear normal.     Nose: Nose normal.  Eyes:     General:        Right eye: No  discharge.        Left eye: No discharge.     Conjunctiva/sclera: Conjunctivae normal.     Pupils: Pupils are equal, round, and reactive to light.  Neck:     Musculoskeletal: Normal range of motion and neck supple.     Thyroid: No thyromegaly.     Vascular: No JVD.     Trachea: No tracheal deviation.  Cardiovascular:     Rate and Rhythm: Normal rate and regular rhythm.     Heart sounds: Normal heart sounds.  Pulmonary:     Effort: No respiratory distress.     Breath sounds: No stridor. No wheezing.  Abdominal:     General: Bowel sounds are normal. There is no distension.     Palpations: Abdomen is soft. There is no mass.     Tenderness: There  is no abdominal tenderness. There is no guarding or rebound.  Musculoskeletal:        General: Tenderness present.  Lymphadenopathy:     Cervical: No cervical adenopathy.  Skin:    Findings: No erythema or rash.  Neurological:     Mental Status: She is oriented to person, place, and time.     Cranial Nerves: No cranial nerve deficit.     Motor: No abnormal muscle tone.     Coordination: Coordination normal.     Gait: Gait abnormal.     Deep Tendon Reflexes: Reflexes normal.  Psychiatric:        Behavior: Behavior normal.        Thought Content: Thought content normal.        Judgment: Judgment normal.   pain in the R groin on palpation B hip ROM - nl, NT Str leg elev (-) B [place]imp[place]  Lab Results  Component Value Date   WBC 6.8 08/10/2018   HGB 13.6 08/10/2018   HCT 40.8 08/10/2018   PLT 190.0 08/10/2018   GLUCOSE 101 (H) 10/28/2018   CHOL 167 09/17/2017   TRIG 92.0 09/17/2017   HDL 53.00 09/17/2017   LDLCALC 96 09/17/2017   ALT 28 08/10/2018   AST 25 08/10/2018   NA 140 10/28/2018   K 4.2 10/28/2018   CL 105 10/28/2018   CREATININE 0.68 10/28/2018   BUN 21 10/28/2018   CO2 27 10/28/2018   TSH 1.50 09/17/2017   HGBA1C 6.3 10/28/2018    Dg Shoulder Left  Result Date: 11/24/2018 CLINICAL DATA:  Fall. Pain. EXAM: LEFT SHOULDER - 2+ VIEW COMPARISON:  None. FINDINGS: There is a mildly displaced, comminuted, and transverse fracture across the surgical neck of the humerus, with slight displacement greater tuberosity. No glenohumeral dislocation. Osteopenia. No adjacent rib fracture or pneumothorax. Degenerative change AC joint. IMPRESSION: Comminuted, and transverse fracture across the surgical neck of the humerus, with slight displacement. Electronically Signed   By: Staci Righter M.D.   On: 11/24/2018 11:42    Assessment & Plan:   There are no diagnoses linked to this encounter.   No orders of the defined types were placed in this encounter.    Follow-up: No  follow-ups on file.  Walker Kehr, MD

## 2019-04-28 NOTE — Assessment & Plan Note (Signed)
  On diet  

## 2019-05-17 DIAGNOSIS — L708 Other acne: Secondary | ICD-10-CM | POA: Diagnosis not present

## 2019-05-17 DIAGNOSIS — Z85828 Personal history of other malignant neoplasm of skin: Secondary | ICD-10-CM | POA: Diagnosis not present

## 2019-05-17 DIAGNOSIS — L821 Other seborrheic keratosis: Secondary | ICD-10-CM | POA: Diagnosis not present

## 2019-07-27 ENCOUNTER — Other Ambulatory Visit: Payer: Self-pay

## 2019-07-27 ENCOUNTER — Ambulatory Visit (INDEPENDENT_AMBULATORY_CARE_PROVIDER_SITE_OTHER): Payer: Medicare Other | Admitting: Internal Medicine

## 2019-07-27 ENCOUNTER — Encounter: Payer: Self-pay | Admitting: Internal Medicine

## 2019-07-27 VITALS — BP 126/72 | HR 75 | Temp 98.7°F | Ht 62.0 in | Wt 196.0 lb

## 2019-07-27 DIAGNOSIS — M5440 Lumbago with sciatica, unspecified side: Secondary | ICD-10-CM

## 2019-07-27 DIAGNOSIS — R300 Dysuria: Secondary | ICD-10-CM | POA: Diagnosis not present

## 2019-07-27 DIAGNOSIS — N3090 Cystitis, unspecified without hematuria: Secondary | ICD-10-CM

## 2019-07-27 DIAGNOSIS — E119 Type 2 diabetes mellitus without complications: Secondary | ICD-10-CM

## 2019-07-27 DIAGNOSIS — G8929 Other chronic pain: Secondary | ICD-10-CM

## 2019-07-27 DIAGNOSIS — I10 Essential (primary) hypertension: Secondary | ICD-10-CM

## 2019-07-27 LAB — BASIC METABOLIC PANEL
BUN: 13 mg/dL (ref 6–23)
CO2: 27 mEq/L (ref 19–32)
Calcium: 9 mg/dL (ref 8.4–10.5)
Chloride: 106 mEq/L (ref 96–112)
Creatinine, Ser: 0.61 mg/dL (ref 0.40–1.20)
GFR: 96.62 mL/min (ref >60.00)
Glucose, Bld: 134 mg/dL — ABNORMAL HIGH (ref 70–99)
Potassium: 4 mEq/L (ref 3.5–5.1)
Sodium: 139 mEq/L (ref 135–145)

## 2019-07-27 LAB — HEMOGLOBIN A1C: Hgb A1c MFr Bld: 6.2 % (ref 4.6–6.5)

## 2019-07-27 MED ORDER — GABAPENTIN 300 MG PO CAPS: 300.0000 mg | ORAL_CAPSULE | Freq: Three times a day (TID) | ORAL | 5 refills | Status: DC

## 2019-07-27 MED ORDER — CEPHALEXIN 500 MG PO CAPS: 500.0000 mg | ORAL_CAPSULE | Freq: Four times a day (QID) | ORAL | 0 refills | Status: DC

## 2019-07-27 MED ORDER — HYDROCODONE-ACETAMINOPHEN 7.5-325 MG PO TABS: 1.0000 | ORAL_TABLET | Freq: Three times a day (TID) | ORAL | 0 refills | Status: DC | PRN

## 2019-07-27 NOTE — Assessment & Plan Note (Signed)
UA

## 2019-07-27 NOTE — Assessment & Plan Note (Signed)
BP Readings from Last 3 Encounters:  07/27/19 126/72  04/28/19 132/76  03/29/19 134/76

## 2019-07-27 NOTE — Assessment & Plan Note (Signed)
A1c On diet

## 2019-07-27 NOTE — Progress Notes (Signed)
Subjective:  Patient ID: Amanda Arnold, female    DOB: 11/16/1945  Age: 74 y.o. MRN: [id number]  CC: No chief complaint on file.   HPI Amanda Arnold presents for abd pain, LBP, anxiety f/u C/o dysuria x 3 d  Outpatient Medications Prior to Visit  Medication Sig Dispense Refill  . ALPRAZolam (XANAX) 0.5 MG tablet TAKE 1 TABLET(0.5 MG) BY MOUTH TWICE DAILY AS NEEDED FOR ANXIETY (Patient taking differently: Take 0.5 mg by mouth 2 (two) times daily as needed for anxiety. ) 60 tablet 2  . Cholecalciferol (VITAMIN D3) 2000 UNITS capsule Take 1 capsule (2,000 Units total) by mouth daily. 100 capsule 3  . gabapentin (NEURONTIN) 300 MG capsule Take 1 capsule (300 mg total) by mouth 3 (three) times daily. 90 capsule 5  . HYDROcodone-acetaminophen (NORCO) 7.5-325 MG tablet Take 1 tablet by mouth every 8 (eight) hours as needed for severe pain. 90 tablet 0  . ibuprofen (ADVIL) 200 MG tablet Take 600 mg by mouth every 6 (six) hours as needed for moderate pain.    Marland Kitchen ketoconazole (NIZORAL) 2 % cream Apply 1 application topically 2 (two) times daily. In the groin 60 g 1  . meclizine (ANTIVERT) 12.5 MG tablet Take 1-2 tablets (12.5-25 mg total) by mouth 3 (three) times daily as needed for dizziness. 60 tablet 1  . metoprolol succinate (TOPROL-XL) 25 MG 24 hr tablet TAKE 1/2 TABLET(12.5 MG) BY MOUTH AT BEDTIME 45 tablet 3  . pramipexole (MIRAPEX) 0.125 MG tablet Take 1-2 tablets (0.125-0.25 mg total) by mouth at bedtime. 60 tablet 5  . umeclidinium-vilanterol (ANORO ELLIPTA) 62.5-25 MCG/INH AEPB Inhale 1 puff into the lungs daily. 1 each 11   No facility-administered medications prior to visit.    ROS: Review of Systems  Constitutional: Negative for activity change, appetite change, chills, fatigue and unexpected weight change.  HENT: Negative for congestion, mouth sores and sinus pressure.   Eyes: Negative for visual disturbance.  Respiratory: Negative for cough and chest tightness.     Gastrointestinal: Negative for abdominal pain and nausea.  Genitourinary: Positive for dysuria, frequency and urgency. Negative for difficulty urinating and vaginal pain.  Musculoskeletal: Positive for arthralgias, back pain and gait problem.  Skin: Negative for pallor and rash.  Neurological: Negative for dizziness, tremors, weakness, numbness and headaches.  Psychiatric/Behavioral: Negative for confusion and sleep disturbance.    Objective:  BP 126/72 (BP Location: Left Arm, Patient Position: Sitting, Cuff Size: Large)   Pulse 75   Temp 98.7 F (37.1 C) (Oral)   Ht 5\' 2"  (1.575 m)   Wt 196 lb (88.9 kg)   SpO2 96%   BMI 35.85 kg/m   BP Readings from Last 3 Encounters:  07/27/19 126/72  04/28/19 132/76  03/29/19 134/76    Wt Readings from Last 3 Encounters:  07/27/19 196 lb (88.9 kg)  04/28/19 194 lb (88 kg)  03/29/19 196 lb (88.9 kg)    Physical Exam Constitutional:      General: She is not in acute distress.    Appearance: She is well-developed. She is obese.  HENT:     Head: Normocephalic.     Right Ear: External ear normal.     Left Ear: External ear normal.     Nose: Nose normal.  Eyes:     General:        Right eye: No discharge.        Left eye: No discharge.     Conjunctiva/sclera: Conjunctivae normal.  Pupils: Pupils are equal, round, and reactive to light.  Neck:     Thyroid: No thyromegaly.     Vascular: No JVD.     Trachea: No tracheal deviation.  Cardiovascular:     Rate and Rhythm: Normal rate and regular rhythm.     Heart sounds: Normal heart sounds.  Pulmonary:     Effort: No respiratory distress.     Breath sounds: No stridor. No wheezing.  Abdominal:     General: Bowel sounds are normal. There is no distension.     Palpations: Abdomen is soft. There is no mass.     Tenderness: There is no abdominal tenderness. There is no guarding or rebound.  Musculoskeletal:        General: Tenderness present.     Cervical back: Normal range of  motion and neck supple.  Lymphadenopathy:     Cervical: No cervical adenopathy.  Skin:    Findings: No erythema or rash.  Neurological:     Cranial Nerves: No cranial nerve deficit.     Motor: No abnormal muscle tone.     Coordination: Coordination normal.     Deep Tendon Reflexes: Reflexes normal.  Psychiatric:        Behavior: Behavior normal.        Thought Content: Thought content normal.        Judgment: Judgment normal.   abd sensitive LS - tender  Lab Results  Component Value Date   WBC 6.8 08/10/2018   HGB 13.6 08/10/2018   HCT 40.8 08/10/2018   PLT 190.0 08/10/2018   GLUCOSE 101 (H) 10/28/2018   CHOL 167 09/17/2017   TRIG 92.0 09/17/2017   HDL 53.00 09/17/2017   LDLCALC 96 09/17/2017   ALT 28 08/10/2018   AST 25 08/10/2018   NA 140 10/28/2018   K 4.2 10/28/2018   CL 105 10/28/2018   CREATININE 0.68 10/28/2018   BUN 21 10/28/2018   CO2 27 10/28/2018   TSH 1.50 09/17/2017   HGBA1C 6.3 10/28/2018    DG Shoulder Left  Result Date: 11/24/2018 CLINICAL DATA:  Fall. Pain. EXAM: LEFT SHOULDER - 2+ VIEW COMPARISON:  None. FINDINGS: There is a mildly displaced, comminuted, and transverse fracture across the surgical neck of the humerus, with slight displacement greater tuberosity. No glenohumeral dislocation. Osteopenia. No adjacent rib fracture or pneumothorax. Degenerative change AC joint. IMPRESSION: Comminuted, and transverse fracture across the surgical neck of the humerus, with slight displacement. Electronically Signed   By: Staci Righter M.D.   On: 11/24/2018 11:42    Assessment & Plan:   There are no diagnoses linked to this encounter.   No orders of the defined types were placed in this encounter.    Follow-up: No follow-ups on file.  Walker Kehr, MD

## 2019-07-27 NOTE — Assessment & Plan Note (Signed)
Norco prn, Gabapentin  Potential benefits of a long term opioids use as well as potential risks (i.e. addiction risk, apnea etc) and complications (i.e. Somnolence, constipation and others) were explained to the patient and were aknowledged.   

## 2019-07-28 LAB — URINALYSIS
Bilirubin Urine: NEGATIVE
Hgb urine dipstick: NEGATIVE
Ketones, ur: NEGATIVE
Leukocytes,Ua: NEGATIVE
Nitrite: NEGATIVE
Specific Gravity, Urine: >=1.03 — AB (ref 1.000–1.030)
Total Protein, Urine: NEGATIVE
Urine Glucose: NEGATIVE
Urobilinogen, UA: 0.2 (ref 0.0–1.0)
pH: 5.5 (ref 5.0–8.0)

## 2019-09-19 DIAGNOSIS — L0291 Cutaneous abscess, unspecified: Secondary | ICD-10-CM | POA: Diagnosis not present

## 2019-09-19 DIAGNOSIS — L304 Erythema intertrigo: Secondary | ICD-10-CM | POA: Diagnosis not present

## 2019-10-21 DIAGNOSIS — L0291 Cutaneous abscess, unspecified: Secondary | ICD-10-CM | POA: Diagnosis not present

## 2019-10-23 LAB — HM DIABETES EYE EXAM

## 2019-10-25 ENCOUNTER — Ambulatory Visit (INDEPENDENT_AMBULATORY_CARE_PROVIDER_SITE_OTHER): Payer: Medicare Other | Admitting: Internal Medicine

## 2019-10-25 ENCOUNTER — Encounter: Payer: Self-pay | Admitting: Internal Medicine

## 2019-10-25 ENCOUNTER — Other Ambulatory Visit: Payer: Self-pay

## 2019-10-25 DIAGNOSIS — M5440 Lumbago with sciatica, unspecified side: Secondary | ICD-10-CM | POA: Diagnosis not present

## 2019-10-25 DIAGNOSIS — E119 Type 2 diabetes mellitus without complications: Secondary | ICD-10-CM

## 2019-10-25 DIAGNOSIS — R0789 Other chest pain: Secondary | ICD-10-CM

## 2019-10-25 DIAGNOSIS — I10 Essential (primary) hypertension: Secondary | ICD-10-CM | POA: Diagnosis not present

## 2019-10-25 DIAGNOSIS — F329 Major depressive disorder, single episode, unspecified: Secondary | ICD-10-CM

## 2019-10-25 DIAGNOSIS — G8929 Other chronic pain: Secondary | ICD-10-CM

## 2019-10-25 DIAGNOSIS — F419 Anxiety disorder, unspecified: Secondary | ICD-10-CM

## 2019-10-25 DIAGNOSIS — F32A Depression, unspecified: Secondary | ICD-10-CM

## 2019-10-25 MED ORDER — GABAPENTIN 300 MG PO CAPS: 300.0000 mg | ORAL_CAPSULE | Freq: Three times a day (TID) | ORAL | 5 refills | Status: DC

## 2019-10-25 MED ORDER — HYDROCODONE-ACETAMINOPHEN 7.5-325 MG PO TABS: 1.0000 | ORAL_TABLET | Freq: Three times a day (TID) | ORAL | 0 refills | Status: DC | PRN

## 2019-10-25 NOTE — Progress Notes (Signed)
Subjective:  Patient ID: Amanda Arnold, female    DOB: [birth year]  Age: 74 y.o. MRN: [id number]  CC: No chief complaint on file.   HPI KHLOEY PELLETT presents for LBP, HTN, DM f/u Pt is refusing to have COVID 19 vaccine C/o CP in the front after a fall about 1 year ago   Outpatient Medications Prior to Visit  Medication Sig Dispense Refill  . ALPRAZolam (XANAX) 0.5 MG tablet TAKE 1 TABLET(0.5 MG) BY MOUTH TWICE DAILY AS NEEDED FOR ANXIETY (Patient taking differently: Take 0.5 mg by mouth 2 (two) times daily as needed for anxiety. ) 60 tablet 2  . Cholecalciferol (VITAMIN D3) 2000 UNITS capsule Take 1 capsule (2,000 Units total) by mouth daily. 100 capsule 3  . gabapentin (NEURONTIN) 300 MG capsule Take 1 capsule (300 mg total) by mouth 3 (three) times daily. 90 capsule 5  . HYDROcodone-acetaminophen (NORCO) 7.5-325 MG tablet Take 1 tablet by mouth every 8 (eight) hours as needed for severe pain. 90 tablet 0  . ibuprofen (ADVIL) 200 MG tablet Take 600 mg by mouth every 6 (six) hours as needed for moderate pain.    Marland Kitchen ketoconazole (NIZORAL) 2 % cream Apply 1 application topically 2 (two) times daily. In the groin 60 g 1  . meclizine (ANTIVERT) 12.5 MG tablet Take 1-2 tablets (12.5-25 mg total) by mouth 3 (three) times daily as needed for dizziness. 60 tablet 1  . metoprolol succinate (TOPROL-XL) 25 MG 24 hr tablet TAKE 1/2 TABLET(12.5 MG) BY MOUTH AT BEDTIME 45 tablet 3  . pramipexole (MIRAPEX) 0.125 MG tablet Take 1-2 tablets (0.125-0.25 mg total) by mouth at bedtime. 60 tablet 5  . umeclidinium-vilanterol (ANORO ELLIPTA) 62.5-25 MCG/INH AEPB Inhale 1 puff into the lungs daily. 1 each 11  . cephALEXin (KEFLEX) 500 MG capsule Take 1 capsule (500 mg total) by mouth 4 (four) times daily. 20 capsule 0   No facility-administered medications prior to visit.    ROS: Review of Systems  Constitutional: Positive for fatigue. Negative for activity change, appetite change, chills and  unexpected weight change.  HENT: Negative for congestion, mouth sores and sinus pressure.   Eyes: Negative for visual disturbance.  Respiratory: Negative for cough and chest tightness.   Gastrointestinal: Negative for abdominal pain and nausea.  Genitourinary: Negative for difficulty urinating, frequency and vaginal pain.  Musculoskeletal: Positive for arthralgias, back pain and gait problem.  Skin: Negative for pallor and rash.  Neurological: Negative for dizziness, tremors, weakness, numbness and headaches.  Psychiatric/Behavioral: Positive for dysphoric mood. Negative for confusion, sleep disturbance and suicidal ideas. The patient is nervous/anxious.     Objective:  BP 130/74 (BP Location: Right Arm, Patient Position: Sitting, Cuff Size: Large)   Pulse 67   Temp 98.4 F (36.9 C) (Oral)   Ht 5\' 2"  (1.575 m)   Wt 197 lb (89.4 kg)   SpO2 96%   BMI 36.03 kg/m   BP Readings from Last 3 Encounters:  10/25/19 130/74  07/27/19 126/72  04/28/19 132/76    Wt Readings from Last 3 Encounters:  10/25/19 197 lb (89.4 kg)  07/27/19 196 lb (88.9 kg)  04/28/19 194 lb (88 kg)    Physical Exam Constitutional:      General: She is not in acute distress.    Appearance: She is well-developed. She is obese.  HENT:     Head: Normocephalic.     Right Ear: External ear normal.     Left Ear: External ear normal.  Nose: Nose normal.  Eyes:     General:        Right eye: No discharge.        Left eye: No discharge.     Conjunctiva/sclera: Conjunctivae normal.     Pupils: Pupils are equal, round, and reactive to light.  Neck:     Thyroid: No thyromegaly.     Vascular: No JVD.     Trachea: No tracheal deviation.  Cardiovascular:     Rate and Rhythm: Normal rate and regular rhythm.     Heart sounds: Normal heart sounds.  Pulmonary:     Effort: No respiratory distress.     Breath sounds: No stridor. No wheezing.  Abdominal:     General: Bowel sounds are normal. There is no  distension.     Palpations: Abdomen is soft. There is no mass.     Tenderness: There is no abdominal tenderness. There is no guarding or rebound.  Musculoskeletal:        General: Tenderness present.     Cervical back: Normal range of motion and neck supple.  Lymphadenopathy:     Cervical: No cervical adenopathy.  Skin:    Findings: No erythema or rash.  Neurological:     Cranial Nerves: No cranial nerve deficit.     Motor: No abnormal muscle tone.     Coordination: Coordination abnormal.     Gait: Gait abnormal.     Deep Tendon Reflexes: Reflexes normal.  Psychiatric:        Behavior: Behavior normal.        Thought Content: Thought content normal.        Judgment: Judgment normal.   LS spine is tender  Lab Results  Component Value Date   WBC 6.8 08/10/2018   HGB 13.6 08/10/2018   HCT 40.8 08/10/2018   PLT 190.0 08/10/2018   GLUCOSE 134 (H) 07/27/2019   CHOL 167 09/17/2017   TRIG 92.0 09/17/2017   HDL 53.00 09/17/2017   LDLCALC 96 09/17/2017   ALT 28 08/10/2018   AST 25 08/10/2018   NA 139 07/27/2019   K 4.0 07/27/2019   CL 106 07/27/2019   CREATININE 0.61 07/27/2019   BUN 13 07/27/2019   CO2 27 07/27/2019   TSH 1.50 09/17/2017   HGBA1C 6.2 07/27/2019    DG Shoulder Left  Result Date: 11/24/2018 CLINICAL DATA:  Fall. Pain. EXAM: LEFT SHOULDER - 2+ VIEW COMPARISON:  None. FINDINGS: There is a mildly displaced, comminuted, and transverse fracture across the surgical neck of the humerus, with slight displacement greater tuberosity. No glenohumeral dislocation. Osteopenia. No adjacent rib fracture or pneumothorax. Degenerative change AC joint. IMPRESSION: Comminuted, and transverse fracture across the surgical neck of the humerus, with slight displacement. Electronically Signed   By: Staci Righter M.D.   On: 11/24/2018 11:42    Assessment & Plan:   There are no diagnoses linked to this encounter.   No orders of the defined types were placed in this  encounter.    Follow-up: No follow-ups on file.  Walker Kehr, MD

## 2019-10-25 NOTE — Assessment & Plan Note (Signed)
Chronic  Xanax prn  Potential benefits of a long term benzodiazepines  use as well as potential risks  and complications were explained to the patient and were aknowledged. 

## 2019-10-25 NOTE — Assessment & Plan Note (Signed)
  On diet  

## 2019-10-25 NOTE — Assessment & Plan Note (Signed)
Toprol XL 

## 2019-10-25 NOTE — Assessment & Plan Note (Deleted)
Cymbalta 

## 2019-10-25 NOTE — Assessment & Plan Note (Signed)
B - CP in the front after a fall about 1 year ago; [facility] CXR

## 2019-10-25 NOTE — Assessment & Plan Note (Addendum)
Norco prn Gabapentin prn  Potential benefits of a long term opioids use as well as potential risks (i.e. addiction risk, apnea etc) and complications (i.e. Somnolence, constipation and others) were explained to the patient and were aknowledged.

## 2019-11-01 ENCOUNTER — Ambulatory Visit (INDEPENDENT_AMBULATORY_CARE_PROVIDER_SITE_OTHER): Payer: Medicare Other

## 2019-11-01 ENCOUNTER — Other Ambulatory Visit: Payer: Self-pay

## 2019-11-01 VITALS — BP 128/80 | HR 69 | Temp 98.3°F | Resp 16 | Ht 62.0 in | Wt 198.0 lb

## 2019-11-01 DIAGNOSIS — Z0000 Encounter for general adult medical examination without abnormal findings: Secondary | ICD-10-CM

## 2019-11-01 NOTE — Progress Notes (Addendum)
Subjective:   Amanda Arnold is a 74 y.o. female who presents for Medicare Annual (Subsequent) preventive examination.  Review of Systems:  No ROS. Medicare Wellness Visit Cardiac Risk Factors include: advanced age (>98men, >68 women);diabetes mellitus;dyslipidemia;family history of premature cardiovascular disease;hypertension;obesity (BMI >30kg/m2);smoking/ tobacco exposure  Sleep Patterns: Issue with falling asleep;no issues with incontinence during sleep. Home Safety/Smoke Alarms: Feels safe in home; Smoke alarms in place. Living environment: Lives in a 1-story home with her husband; good family support system. Seat Belt Safety/Bike Helmet: Wears seat belt.    Objective:     Vitals: BP 128/80 (BP Location: Right Arm, Patient Position: Sitting, Cuff Size: Normal)   Pulse 69   Temp 98.3 F (36.8 C)   Resp 16   Ht 5\' 2"  (1.575 m)   Wt 198 lb (89.8 kg)   SpO2 97%   BMI 36.21 kg/m   Body mass index is 36.21 kg/m.  Advanced Directives 11/01/2019 10/28/2018 06/28/2018 02/08/2018 10/01/2016 09/26/2016 02/09/2016  Does Patient Have a Medical Advance Directive? No No No No No No No  Does patient want to make changes to medical advance directive? - - - - - No - Patient declined -  Would patient like information on creating a medical advance directive? No - Patient declined No - Patient declined No - Patient declined - - - No - patient declined information    Tobacco Social History   Tobacco Use  Smoking Status Current Some Day Smoker   Types: Cigarettes   Last attempt to quit: 07/10/2013   Years since quitting: 6.3  Smokeless Tobacco Current User   Types: Snuff  Tobacco Comment   smokes 2-4 cigs, maybe more when anxious     Ready to quit: No Counseling given: Yes Comment: smokes 2-4 cigs, maybe more when anxious   Clinical Intake:  Pre-visit preparation completed: Yes  Pain : 0-10 Pain Score: 4  Pain Type: Chronic pain Pain Location: Knee Pain Orientation:  Right Pain Descriptors / Indicators: Aching Pain Onset: More than a month ago Pain Frequency: Intermittent     BMI - recorded: 36.2 Nutritional Status: BMI > 30  Obese Nutritional Risks: None Diabetes: Yes CBG done?: No Did pt. bring in CBG monitor from home?: No  How often do you need to have someone help you when you read instructions, pamphlets, or other written materials from your doctor or pharmacy?: 1 - Never What is the last grade level you completed in school?: 8th grade  Interpreter Needed?: No  Information entered by :: Trude Cansler N. Lowell Guitar, LPN  Past Medical History:  Diagnosis Date   Abdominal pain, epigastric 12/18/2009   ANXIETY 05/25/2006   BRONCHITIS, ACUTE 12/08/2007   COPD 05/25/2006   DEPRESSION 01/02/2006   ESOTROPIA, LEFT EYE 04-30-48   FIBROMYALGIA 04/02/2006   Headache(784.0) 12/08/2007   HYPERGLYCEMIA 05/15/2010   HYPERLIPIDEMIA 04/30/2006   HYPERTENSION 05/25/2006   Hypertension    INSOMNIA, PERSISTENT 02/22/2008   Lazy eye    left   LOW BACK PAIN 01/27/2007   Migraines    NEURALGIA, 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/2009   OBSTRUCTIVE SLEEP APNEA 04/17/2010   does not use CPAP   OSTEOARTHRITIS 05/25/2006   Polyuria    Pre-diabetes    Psychosomatic disease 2011   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   T M J 05/11/2009   Past Surgical History:  Procedure Laterality Date   CHOLECYSTECTOMY     MASS EXCISION Left 10/06/2016   Procedure: EXCISION LEFT BUTTOCKS MASS;  Surgeon: Alphonsa Overall, MD;  Location: [place];  Service: General;  Laterality: Left;   Family History  Problem Relation Age of Onset   Heart attack Father    Hypertension Brother    Heart disease Mother    Colon cancer Neg Hx    Stomach cancer Neg Hx    Social History   Socioeconomic History   Marital status: Married    Spouse name: Not on file   Number of children: 5   Years of education: Not on file   Highest education level: Not on file  Occupational History   Occupation:  house wife    Employer: UNEMPLOYED  Tobacco Use   Smoking status: Current Some Day Smoker    Types: Cigarettes    Last attempt to quit: 07/10/2013    Years since quitting: 6.3   Smokeless tobacco: Current User    Types: Snuff   Tobacco comment: smokes 2-4 cigs, maybe more when anxious  Substance and Sexual Activity   Alcohol use: No    Alcohol/week: 0.0 standard drinks   Drug use: No   Sexual activity: Not Currently  Other Topics Concern   Not on file  Social History Narrative   Not on file   Social Determinants of Health   Financial Resource Strain:    Difficulty of Paying Living Expenses:   Food Insecurity:    Worried About Charity fundraiser in the Last Year:    Arboriculturist in the Last Year:   Transportation Needs:    Film/video editor (Medical):    Lack of Transportation (Non-Medical):   Physical Activity:    Days of Exercise per Week:    Minutes of Exercise per Session:   Stress:    Feeling of Stress :   Social Connections:    Frequency of Communication with Friends and Family:    Frequency of Social Gatherings with Friends and Family:    Attends Religious Services:    Active Member of Clubs or Organizations:    Attends Archivist Meetings:    Marital Status:     Outpatient Encounter Medications as of 11/01/2019  Medication Sig   ALPRAZolam (XANAX) 0.5 MG tablet TAKE 1 TABLET(0.5 MG) BY MOUTH TWICE DAILY AS NEEDED FOR ANXIETY (Patient taking differently: Take 0.5 mg by mouth 2 (two) times daily as needed for anxiety. )   Cholecalciferol (VITAMIN D3) 2000 UNITS capsule Take 1 capsule (2,000 Units total) by mouth daily.   gabapentin (NEURONTIN) 300 MG capsule Take 1 capsule (300 mg total) by mouth 3 (three) times daily.   HYDROcodone-acetaminophen (NORCO) 7.5-325 MG tablet Take 1 tablet by mouth every 8 (eight) hours as needed for severe pain.   ibuprofen (ADVIL) 200 MG tablet Take 600 mg by mouth every 6 (six) hours as needed for moderate pain.    ketoconazole (NIZORAL) 2 % cream Apply 1 application topically 2 (two) times daily. In the groin   meclizine (ANTIVERT) 12.5 MG tablet Take 1-2 tablets (12.5-25 mg total) by mouth 3 (three) times daily as needed for dizziness.   metoprolol succinate (TOPROL-XL) 25 MG 24 hr tablet TAKE 1/2 TABLET(12.5 MG) BY MOUTH AT BEDTIME   pramipexole (MIRAPEX) 0.125 MG tablet Take 1-2 tablets (0.125-0.25 mg total) by mouth at bedtime.   umeclidinium-vilanterol (ANORO ELLIPTA) 62.5-25 MCG/INH AEPB Inhale 1 puff into the lungs daily.   No facility-administered encounter medications on file as of 11/01/2019.    Activities of Daily Living In your present state of health, do  you have any difficulty performing the following activities: 11/01/2019  Hearing? N  Vision? N  Difficulty concentrating or making decisions? Y  Walking or climbing stairs? N  Dressing or bathing? N  Doing errands, shopping? N  Preparing Food and eating ? N  Using the Toilet? N  In the past six months, have you accidently leaked urine? N  Do you have problems with loss of bowel control? N  Managing your Medications? N  Managing your Finances? N  Housekeeping or managing your Housekeeping? N  Some recent data might be hidden    Patient Care Team: Plotnikov, Evie Lacks, MD as PCP - General Erline Levine, MD as Consulting Physician (Neurosurgery)    Assessment:   This is a routine wellness examination for Tanveer.  Exercise Activities and Dietary recommendations Current Exercise Habits: The patient does not participate in regular exercise at present, Exercise limited by: orthopedic condition(s);psychological condition(s)  Goals      Client understands the importance of follow-up with providers by attending scheduled visits     Eat healthier and increase water intake     patient (pt-stated)     Book; "saying goodnight to insomnia"  Or other sleep type workbook      Patient Stated     Eat as healthy as possible and stop  going back for second portions. Stay active by going out in my yard and walking around. Enjoy reading and sitting in my swing.     Patient Stated     To get healthier.     Weight < 180 lb (81.647 kg)     Plan; watches the cooking shows and weight loss programs; popcorn Foods at hs; baked kale at hs; bowl of cereal; discussed walking during commercials;  100 calories /individual serving         Fall Risk Fall Risk  11/01/2019 10/28/2018 12/21/2017 09/26/2016 07/03/2015  Falls in the past year? 1 0 No No No  Number falls in past yr: 0 0 - - -  Injury with Fall? 1 - - - -  Comment broke shoulder - - - -  Risk for fall due to : Other (Comment) - - - -  Risk for fall due to: Comment syncope episode at grocery store - - - -  Follow up Falls evaluation completed;Education provided;Falls prevention discussed - - - -   Is the patient's home free of loose throw rugs in walkways, pet beds, electrical cords, etc?   yes      Grab bars in the bathroom? yes      Handrails on the stairs?   yes      Adequate lighting?   yes  Depression Screen PHQ 2/9 Scores 11/01/2019 10/28/2018 12/21/2017 09/26/2016  PHQ - 2 Score 0 2 1 1   PHQ- 9 Score - 7 - -           Immunization History  Administered Date(s) Administered   Pneumococcal Conjugate-13 06/27/2016   Pneumococcal Polysaccharide-23 04/14/2005, 09/17/2017   Tdap 04/28/2019    Qualifies for Shingles Vaccine? declined  Screening Tests Health Maintenance  Topic Date Due   Hepatitis C Screening  Never done   URINE MICROALBUMIN  Never done   COVID-19 Vaccine (1) Never done   OPHTHALMOLOGY EXAM  04/15/2019   COLONOSCOPY  08/29/2019   MAMMOGRAM  09/19/2019   FOOT EXAM  10/28/2019   HEMOGLOBIN A1C  01/24/2020   INFLUENZA VACCINE  02/12/2020   TETANUS/TDAP  04/27/2029   DEXA SCAN  Completed  PNA vac Low Risk Adult  Completed    Cancer Screenings: Lung: Low Dose CT Chest recommended if Age 42-80 years, 30 pack-year currently smoking OR  have quit w/in 15years. Patient does qualify. Breast:  Up to date on Mammogram? Yes   Up to date of Bone Density/Dexa? No Colorectal: due 2026     Plan:    Reviewed health maintenance screenings with patient today and relevant education, vaccines, and/or referrals were provided.    Continue doing brain stimulating activities (puzzles, reading, adult coloring books, staying active) to keep memory sharp.    Continue to eat heart healthy diet (full of fruits, vegetables, whole grains, lean protein, water--limit salt, fat, and sugar intake) and increase physical activity as tolerated.  I have personally reviewed and noted the following in the patient's chart:   Medical and social history Use of alcohol, tobacco or illicit drugs  Current medications and supplements Functional ability and status Nutritional status Physical activity Advanced directives List of other physicians Hospitalizations, surgeries, and ER visits in previous 12 months Vitals Screenings to include cognitive, depression, and falls Referrals and appointments  In addition, I have reviewed and discussed with patient certain preventive protocols, quality metrics, and best practice recommendations. A written personalized care plan for preventive services as well as general preventive health recommendations were provided to patient.     Sheral Flow, LPN  [id number]  Nurse Health Advisor   Medical screening examination/treatment/procedure(s) were performed by non-physician practitioner and as supervising physician I was immediately available for consultation/collaboration.  I agree with above. Lew Dawes, MD

## 2019-11-01 NOTE — Patient Instructions (Signed)
[name] , Thank you for taking time to come for your Medicare Wellness Visit. I appreciate your ongoing commitment to your health goals. Please review the following plan we discussed and let me know if I can assist you in the future.   Screening recommendations/referrals: Colorectal Screening: due 08/26/2024 Mammogram: due 12/17/2019  Vision and Dental Exams: Recommended annual ophthalmology exams for early detection of glaucoma and other disorders of the eye Recommended annual dental exams for proper oral hygiene  Diabetic Exams: Diabetic Eye Exam: 04/14/2018 Diabetic Foot Exam: 10/28/2018  Vaccinations: Influenza vaccine: declined Pneumococcal vaccine: completed Tdap vaccine: 04/28/2019; Due every 10 years Shingles vaccine: declined  Advanced directives: Advance directives discussed with you today. You have declined to receive documents for completion.  Goals:  Recommend to drink at least 6-8 8oz glasses of water per day.  Recommend to exercise for at least 150 minutes per week.  Recommend to remove any items from the home that may cause slips or trips.  Recommend to decrease portion sizes by eating 3 small healthy meals and at least 2 healthy snacks per day.  Recommend to begin DASH diet as directed below  Recommend to continue efforts to reduce smoking habits until no longer smoking. Smoking Cessation literature is attached below.  Next appointment: Please schedule your Annual Wellness Visit with your Nurse Health Advisor in one year.  Preventive Care 75 Years and Older, Female Preventive care refers to lifestyle choices and visits with your health care provider that can promote health and wellness. What does preventive care include?  A yearly physical exam. This is also called an annual well check.  Dental exams once or twice a year.  Routine eye exams. Ask your health care provider how often you should have your eyes checked.  Personal lifestyle choices,  including:  Daily care of your teeth and gums.  Regular physical activity.  Eating a healthy diet.  Avoiding tobacco and drug use.  Limiting alcohol use.  Practicing safe sex.  Taking low-dose aspirin every day if recommended by your health care provider.  Taking vitamin and mineral supplements as recommended by your health care provider. What happens during an annual well check? The services and screenings done by your health care provider during your annual well check will depend on your age, overall health, lifestyle risk factors, and family history of disease. Counseling  Your health care provider may ask you questions about your:  Alcohol use.  Tobacco use.  Drug use.  Emotional well-being.  Home and relationship well-being.  Sexual activity.  Eating habits.  History of falls.  Memory and ability to understand (cognition).  Work and work Statistician.  Reproductive health. Screening  You may have the following tests or measurements:  Height, weight, and BMI.  Blood pressure.  Lipid and cholesterol levels. These may be checked every 5 years, or more frequently if you are over 44 years old.  Skin check.  Lung cancer screening. You may have this screening every year starting at age 41 if you have a 30-pack-year history of smoking and currently smoke or have quit within the past 15 years.  Fecal occult blood test (FOBT) of the stool. You may have this test every year starting at age 44.  Flexible sigmoidoscopy or colonoscopy. You may have a sigmoidoscopy every 5 years or a colonoscopy every 10 years starting at age 71.  Hepatitis C blood test.  Hepatitis B blood test.  Sexually transmitted disease (STD) testing.  Diabetes screening. This is done  by checking your blood sugar (glucose) after you have not eaten for a while (fasting). You may have this done every 1-3 years.  Bone density scan. This is done to screen for osteoporosis. You may have this  done starting at age 74.  Mammogram. This may be done every 1-2 years. Talk to your health care provider about how often you should have regular mammograms. Talk with your health care provider about your test results, treatment options, and if necessary, the need for more tests. Vaccines  Your health care provider may recommend certain vaccines, such as:  Influenza vaccine. This is recommended every year.  Tetanus, diphtheria, and acellular pertussis (Tdap, Td) vaccine. You may need a Td booster every 10 years.  Zoster vaccine. You may need this after age 42.  Pneumococcal 13-valent conjugate (PCV13) vaccine. One dose is recommended after age 20.  Pneumococcal polysaccharide (PPSV23) vaccine. One dose is recommended after age 96. Talk to your health care provider about which screenings and vaccines you need and how often you need them. This information is not intended to replace advice given to you by your health care provider. Make sure you discuss any questions you have with your health care provider. Document Released: 07/27/2015 Document Revised: 03/19/2016 Document Reviewed: 05/01/2015 Elsevier Interactive Patient Education  2017 [employer] Prevention in the Home Falls can cause injuries. They can happen to people of all ages. There are many things you can do to make your home safe and to help prevent falls. What can I do on the outside of my home?  Regularly fix the edges of walkways and driveways and fix any cracks.  Remove anything that might make you trip as you walk through a door, such as a raised step or threshold.  Trim any bushes or trees on the path to your home.  Use bright outdoor lighting.  Clear any walking paths of anything that might make someone trip, such as rocks or tools.  Regularly check to see if handrails are loose or broken. Make sure that both sides of any steps have handrails.  Any raised decks and porches should have guardrails on the  edges.  Have any leaves, snow, or ice cleared regularly.  Use sand or salt on walking paths during winter.  Clean up any spills in your garage right away. This includes oil or grease spills. What can I do in the bathroom?  Use night lights.  Install grab bars by the toilet and in the tub and shower. Do not use towel bars as grab bars.  Use non-skid mats or decals in the tub or shower.  If you need to sit down in the shower, use a plastic, non-slip stool.  Keep the floor dry. Clean up any water that spills on the floor as soon as it happens.  Remove soap buildup in the tub or shower regularly.  Attach bath mats securely with double-sided non-slip rug tape.  Do not have throw rugs and other things on the floor that can make you trip. What can I do in the bedroom?  Use night lights.  Make sure that you have a light by your bed that is easy to reach.  Do not use any sheets or blankets that are too big for your bed. They should not hang down onto the floor.  Have a firm chair that has side arms. You can use this for support while you get dressed.  Do not have throw rugs and other things on  the floor that can make you trip. What can I do in the kitchen?  Clean up any spills right away.  Avoid walking on wet floors.  Keep items that you use a lot in easy-to-reach places.  If you need to reach something above you, use a strong step stool that has a grab bar.  Keep electrical cords out of the way.  Do not use floor polish or wax that makes floors slippery. If you must use wax, use non-skid floor wax.  Do not have throw rugs and other things on the floor that can make you trip. What can I do with my stairs?  Do not leave any items on the stairs.  Make sure that there are handrails on both sides of the stairs and use them. Fix handrails that are broken or loose. Make sure that handrails are as long as the stairways.  Check any carpeting to make sure that it is firmly  attached to the stairs. Fix any carpet that is loose or worn.  Avoid having throw rugs at the top or bottom of the stairs. If you do have throw rugs, attach them to the floor with carpet tape.  Make sure that you have a light switch at the top of the stairs and the bottom of the stairs. If you do not have them, ask someone to add them for you. What else can I do to help prevent falls?  Wear shoes that:  Do not have high heels.  Have rubber bottoms.  Are comfortable and fit you well.  Are closed at the toe. Do not wear sandals.  If you use a stepladder:  Make sure that it is fully opened. Do not climb a closed stepladder.  Make sure that both sides of the stepladder are locked into place.  Ask someone to hold it for you, if possible.  Clearly mark and make sure that you can see:  Any grab bars or handrails.  First and last steps.  Where the edge of each step is.  Use tools that help you move around (mobility aids) if they are needed. These include:  Canes.  Walkers.  Scooters.  Crutches.  Turn on the lights when you go into a dark area. Replace any light bulbs as soon as they burn out.  Set up your furniture so you have a clear path. Avoid moving your furniture around.  If any of your floors are uneven, fix them.  If there are any pets around you, be aware of where they are.  Review your medicines with your doctor. Some medicines can make you feel dizzy. This can increase your chance of falling. Ask your doctor what other things that you can do to help prevent falls. This information is not intended to replace advice given to you by your health care provider. Make sure you discuss any questions you have with your health care provider. Document Released: 04/26/2009 Document Revised: 12/06/2015 Document Reviewed: 08/04/2014 Elsevier Interactive Patient Education  2017 [employer].

## 2019-11-04 LAB — HEMOGLOBIN A1C: Hemoglobin A1C: 6.7

## 2019-11-11 ENCOUNTER — Encounter: Payer: Self-pay | Admitting: Internal Medicine

## 2019-11-14 DIAGNOSIS — Z85828 Personal history of other malignant neoplasm of skin: Secondary | ICD-10-CM | POA: Diagnosis not present

## 2019-11-14 DIAGNOSIS — L905 Scar conditions and fibrosis of skin: Secondary | ICD-10-CM | POA: Diagnosis not present

## 2019-11-18 ENCOUNTER — Encounter: Payer: Self-pay | Admitting: Internal Medicine

## 2020-01-16 ENCOUNTER — Other Ambulatory Visit: Payer: Self-pay | Admitting: Internal Medicine

## 2020-01-24 ENCOUNTER — Ambulatory Visit (INDEPENDENT_AMBULATORY_CARE_PROVIDER_SITE_OTHER): Payer: Medicare Other | Admitting: Internal Medicine

## 2020-01-24 ENCOUNTER — Encounter: Payer: Self-pay | Admitting: Internal Medicine

## 2020-01-24 ENCOUNTER — Other Ambulatory Visit: Payer: Self-pay

## 2020-01-24 DIAGNOSIS — E785 Hyperlipidemia, unspecified: Secondary | ICD-10-CM

## 2020-01-24 DIAGNOSIS — E119 Type 2 diabetes mellitus without complications: Secondary | ICD-10-CM

## 2020-01-24 DIAGNOSIS — F411 Generalized anxiety disorder: Secondary | ICD-10-CM | POA: Diagnosis not present

## 2020-01-24 DIAGNOSIS — M542 Cervicalgia: Secondary | ICD-10-CM | POA: Diagnosis not present

## 2020-01-24 DIAGNOSIS — Z6836 Body mass index (BMI) 36.0-36.9, adult: Secondary | ICD-10-CM

## 2020-01-24 DIAGNOSIS — I10 Essential (primary) hypertension: Secondary | ICD-10-CM | POA: Diagnosis not present

## 2020-01-24 MED ORDER — CELECOXIB 200 MG PO CAPS: 200.0000 mg | ORAL_CAPSULE | Freq: Every day | ORAL | 3 refills | Status: DC | PRN

## 2020-01-24 MED ORDER — METOPROLOL SUCCINATE ER 25 MG PO TB24: ORAL_TABLET | ORAL | 3 refills | Status: DC

## 2020-01-24 MED ORDER — HYDROCODONE-ACETAMINOPHEN 7.5-325 MG PO TABS: 1.0000 | ORAL_TABLET | Freq: Three times a day (TID) | ORAL | 0 refills | Status: DC | PRN

## 2020-01-24 NOTE — Assessment & Plan Note (Signed)
Celebrex prn ROM exercises

## 2020-01-24 NOTE — Progress Notes (Signed)
Subjective:  Patient ID: Amanda Arnold, female    DOB: 05-12-1946  Age: 74 y.o. MRN: [id number]  CC: No chief complaint on file.   HPI Amanda Arnold presents for HTN, LBP, anxiety f/u C/o neck pain, HAs COVID vacine advised - pt refused   Outpatient Medications Prior to Visit  Medication Sig Dispense Refill  . ALPRAZolam (XANAX) 0.5 MG tablet TAKE 1 TABLET(0.5 MG) BY MOUTH TWICE DAILY AS NEEDED FOR ANXIETY (Patient taking differently: Take 0.5 mg by mouth 2 (two) times daily as needed for anxiety. ) 60 tablet 2  . Cholecalciferol (VITAMIN D3) 2000 UNITS capsule Take 1 capsule (2,000 Units total) by mouth daily. 100 capsule 3  . gabapentin (NEURONTIN) 300 MG capsule Take 1 capsule (300 mg total) by mouth 3 (three) times daily. 90 capsule 5  . ibuprofen (ADVIL) 200 MG tablet Take 600 mg by mouth every 6 (six) hours as needed for moderate pain.    Marland Kitchen ketoconazole (NIZORAL) 2 % cream Apply 1 application topically 2 (two) times daily. In the groin 60 g 1  . meclizine (ANTIVERT) 12.5 MG tablet Take 1-2 tablets (12.5-25 mg total) by mouth 3 (three) times daily as needed for dizziness. 60 tablet 1  . pramipexole (MIRAPEX) 0.125 MG tablet Take 1-2 tablets (0.125-0.25 mg total) by mouth at bedtime. 60 tablet 5  . umeclidinium-vilanterol (ANORO ELLIPTA) 62.5-25 MCG/INH AEPB Inhale 1 puff into the lungs daily. 1 each 11  . HYDROcodone-acetaminophen (NORCO) 7.5-325 MG tablet Take 1 tablet by mouth every 8 (eight) hours as needed for severe pain. 90 tablet 0  . metoprolol succinate (TOPROL-XL) 25 MG 24 hr tablet TAKE 1/2 TABLET(12.5 MG) BY MOUTH AT BEDTIME 45 tablet 3   No facility-administered medications prior to visit.    ROS: Review of Systems  Constitutional: Negative for activity change, appetite change, chills, fatigue and unexpected weight change.  HENT: Negative for congestion, mouth sores and sinus pressure.   Eyes: Negative for visual disturbance.  Respiratory: Negative for cough  and chest tightness.   Gastrointestinal: Negative for abdominal pain and nausea.  Genitourinary: Negative for difficulty urinating, frequency and vaginal pain.  Musculoskeletal: Positive for back pain, gait problem and neck pain.  Skin: Negative for pallor and rash.  Neurological: Positive for headaches. Negative for dizziness, tremors, weakness and numbness.  Psychiatric/Behavioral: Negative for confusion and sleep disturbance.    Objective:  BP 122/60 (BP Location: Left Arm, Patient Position: Sitting, Cuff Size: Large)   Pulse 73   Temp 98.4 F (36.9 C) (Oral)   Ht 5\' 2"  (1.575 m)   Wt 191 lb (86.6 kg)   SpO2 98%   BMI 34.93 kg/m   BP Readings from Last 3 Encounters:  01/24/20 122/60  11/01/19 128/80  10/25/19 130/74    Wt Readings from Last 3 Encounters:  01/24/20 191 lb (86.6 kg)  11/01/19 198 lb (89.8 kg)  10/25/19 197 lb (89.4 kg)    Physical Exam Constitutional:      General: She is not in acute distress.    Appearance: She is well-developed. She is obese.  HENT:     Head: Normocephalic.     Right Ear: External ear normal.     Left Ear: External ear normal.     Nose: Nose normal.  Eyes:     General:        Right eye: No discharge.        Left eye: No discharge.     Conjunctiva/sclera: Conjunctivae normal.  Pupils: Pupils are equal, round, and reactive to light.  Neck:     Thyroid: No thyromegaly.     Vascular: No JVD.     Trachea: No tracheal deviation.  Cardiovascular:     Rate and Rhythm: Normal rate and regular rhythm.     Heart sounds: Normal heart sounds.  Pulmonary:     Effort: No respiratory distress.     Breath sounds: No stridor. No wheezing.  Abdominal:     General: Bowel sounds are normal. There is no distension.     Palpations: Abdomen is soft. There is no mass.     Tenderness: There is no abdominal tenderness. There is no guarding or rebound.  Musculoskeletal:        General: No tenderness.     Cervical back: Normal range of motion  and neck supple.  Lymphadenopathy:     Cervical: No cervical adenopathy.  Skin:    Findings: No erythema or rash.  Neurological:     Cranial Nerves: No cranial nerve deficit.     Motor: No abnormal muscle tone.     Coordination: Coordination normal.     Deep Tendon Reflexes: Reflexes normal.  Psychiatric:        Behavior: Behavior normal.        Thought Content: Thought content normal.        Judgment: Judgment normal.    LS, neck - tender w/ROM  Lab Results  Component Value Date   WBC 6.8 08/10/2018   HGB 13.6 08/10/2018   HCT 40.8 08/10/2018   PLT 190.0 08/10/2018   GLUCOSE 134 (H) 07/27/2019   CHOL 167 09/17/2017   TRIG 92.0 09/17/2017   HDL 53.00 09/17/2017   LDLCALC 96 09/17/2017   ALT 28 08/10/2018   AST 25 08/10/2018   NA 139 07/27/2019   K 4.0 07/27/2019   CL 106 07/27/2019   CREATININE 0.61 07/27/2019   BUN 13 07/27/2019   CO2 27 07/27/2019   TSH 1.50 09/17/2017   HGBA1C 6.7 11/04/2019    DG Shoulder Left  Result Date: 11/24/2018 CLINICAL DATA:  Fall. Pain. EXAM: LEFT SHOULDER - 2+ VIEW COMPARISON:  None. FINDINGS: There is a mildly displaced, comminuted, and transverse fracture across the surgical neck of the humerus, with slight displacement greater tuberosity. No glenohumeral dislocation. Osteopenia. No adjacent rib fracture or pneumothorax. Degenerative change AC joint. IMPRESSION: Comminuted, and transverse fracture across the surgical neck of the humerus, with slight displacement. Electronically Signed   By: Staci Righter M.D.   On: 11/24/2018 11:42    Assessment & Plan:   There are no diagnoses linked to this encounter.   Meds ordered this encounter  Medications  . HYDROcodone-acetaminophen (NORCO) 7.5-325 MG tablet    Sig: Take 1 tablet by mouth every 8 (eight) hours as needed for severe pain.    Dispense:  90 tablet    Refill:  0    M54.30  . metoprolol succinate (TOPROL-XL) 25 MG 24 hr tablet    Sig: TAKE 1/2 TABLET(12.5 MG) BY MOUTH AT  BEDTIME    Dispense:  45 tablet    Refill:  3     Follow-up: No follow-ups on file.  Walker Kehr, MD

## 2020-01-24 NOTE — Assessment & Plan Note (Signed)
On PRN Xanax  Potential benefits of a long term benzodiazepines  use as well as potential risks  and complications were explained to the patient and were aknowledged. 

## 2020-01-24 NOTE — Assessment & Plan Note (Signed)
On diet Declined statins 

## 2020-01-24 NOTE — Assessment & Plan Note (Signed)
Toprol XL 

## 2020-01-24 NOTE — Assessment & Plan Note (Signed)
Wt Readings from Last 3 Encounters:  01/24/20 191 lb (86.6 kg)  11/01/19 198 lb (89.8 kg)  10/25/19 197 lb (89.4 kg)

## 2020-01-24 NOTE — Assessment & Plan Note (Signed)
On diet Risks associated with diet noncompliance were discussed. Compliance was encouraged.

## 2020-02-14 ENCOUNTER — Telehealth: Payer: Self-pay

## 2020-02-14 NOTE — Telephone Encounter (Signed)
Kim, nurse with [facility], calling and states that she triaged the patient for chest pain/heaviness lasting longer than 5 mins. States that the patient said that she takes an aspirin and it helps with the pain, but then the pain comes back. Patient was advised to call EMS 911, patient refused and disconnected the call. Maudie Mercury states that they have tried to reach her and she will not answer. Would like clinical staff to call patient.

## 2020-02-14 NOTE — Telephone Encounter (Signed)
Advised pt to go to ED, she refused once again and said she'd go if it gets worse. Scheduled OV for 8/5 @ 8:10a

## 2020-02-15 NOTE — Telephone Encounter (Signed)
Noted. Thx.

## 2020-02-16 ENCOUNTER — Ambulatory Visit (INDEPENDENT_AMBULATORY_CARE_PROVIDER_SITE_OTHER): Payer: Medicare Other | Admitting: Internal Medicine

## 2020-02-16 ENCOUNTER — Other Ambulatory Visit: Payer: Self-pay

## 2020-02-16 ENCOUNTER — Encounter: Payer: Self-pay | Admitting: Internal Medicine

## 2020-02-16 DIAGNOSIS — R002 Palpitations: Secondary | ICD-10-CM | POA: Diagnosis not present

## 2020-02-16 DIAGNOSIS — I10 Essential (primary) hypertension: Secondary | ICD-10-CM

## 2020-02-16 DIAGNOSIS — R000 Tachycardia, unspecified: Secondary | ICD-10-CM | POA: Diagnosis not present

## 2020-02-16 DIAGNOSIS — R42 Dizziness and giddiness: Secondary | ICD-10-CM | POA: Diagnosis not present

## 2020-02-16 LAB — CBC WITH DIFFERENTIAL/PLATELET
Absolute Monocytes: 541 cells/uL (ref 200–950)
Basophils Absolute: 53 cells/uL (ref 0–200)
Basophils Relative: 0.8 %
Eosinophils Absolute: 119 cells/uL (ref 15–500)
Eosinophils Relative: 1.8 %
HCT: 40 % (ref 35.0–45.0)
Hemoglobin: 13.3 g/dL (ref 11.7–15.5)
Lymphs Abs: 2600 cells/uL (ref 850–3900)
MCH: 31.3 pg (ref 27.0–33.0)
MCHC: 33.3 g/dL (ref 32.0–36.0)
MCV: 94.1 fL (ref 80.0–100.0)
MPV: 11.6 fL (ref 7.5–12.5)
Monocytes Relative: 8.2 %
Neutro Abs: 3287 cells/uL (ref 1500–7800)
Neutrophils Relative %: 49.8 %
Platelets: 163 10*3/uL (ref 140–400)
RBC: 4.25 10*6/uL (ref 3.80–5.10)
RDW: 12.2 % (ref 11.0–15.0)
Total Lymphocyte: 39.4 %
WBC: 6.6 10*3/uL (ref 3.8–10.8)

## 2020-02-16 LAB — COMPLETE METABOLIC PANEL WITH GFR
AG Ratio: 1.8 (calc) (ref 1.0–2.5)
ALT: 21 U/L (ref 6–29)
AST: 23 U/L (ref 10–35)
Albumin: 4.1 g/dL (ref 3.6–5.1)
Alkaline phosphatase (APISO): 70 U/L (ref 37–153)
BUN: 21 mg/dL (ref 7–25)
CO2: 25 mmol/L (ref 20–32)
Calcium: 9 mg/dL (ref 8.6–10.4)
Chloride: 107 mmol/L (ref 98–110)
Creat: 0.72 mg/dL (ref 0.60–0.93)
GFR, Est African American: 98 mL/min/{1.73_m2} (ref >=60)
GFR, Est Non African American: 84 mL/min/{1.73_m2} (ref >=60)
Globulin: 2.3 g/dL (calc) (ref 1.9–3.7)
Glucose, Bld: 99 mg/dL (ref 65–99)
Potassium: 3.9 mmol/L (ref 3.5–5.3)
Sodium: 140 mmol/L (ref 135–146)
Total Bilirubin: 0.5 mg/dL (ref 0.2–1.2)
Total Protein: 6.4 g/dL (ref 6.1–8.1)

## 2020-02-16 LAB — TSH: TSH: 1.24 mIU/L (ref 0.40–4.50)

## 2020-02-16 LAB — T4, FREE: Free T4: 1.2 ng/dL (ref 0.8–1.8)

## 2020-02-16 MED ORDER — METOPROLOL SUCCINATE ER 25 MG PO TB24: ORAL_TABLET | ORAL | 3 refills | Status: DC

## 2020-02-16 NOTE — Assessment & Plan Note (Signed)
BP Readings from Last 3 Encounters:  02/16/20 (!) 122/58  01/24/20 122/60  11/01/19 128/80  Toprol

## 2020-02-16 NOTE — Assessment & Plan Note (Signed)
?  S tachy Toprol increased to 12.5 mg bid Labs ECHO Card ref

## 2020-02-16 NOTE — Assessment & Plan Note (Signed)
BPV Meclizine

## 2020-02-16 NOTE — Addendum Note (Signed)
Addended by: Cresenciano Lick on: 02/16/2020 08:57 AM   Modules accepted: Orders

## 2020-02-16 NOTE — Assessment & Plan Note (Signed)
Toprol increased to 12.5 mg bid Labs ECHO Card ref Palpitations and dizziness w/sweats, chest tightness off and on, not feeling right on occasion - she is a poor historian. No LOC. Meclizine helps.

## 2020-02-16 NOTE — Progress Notes (Signed)
Subjective:  Patient ID: Amanda Arnold, female    DOB: 01-Aug-1945  Age: 74 y.o. MRN: [id number]  CC: No chief complaint on file.   HPI Amanda Arnold presents for palpitations and dizziness w/sweats, chest tightness off and on, not feeling right on occasion - she is a poor historian. No LOC. Meclizine helps. C/o HAs.  Outpatient Medications Prior to Visit  Medication Sig Dispense Refill  . ALPRAZolam (XANAX) 0.5 MG tablet TAKE 1 TABLET(0.5 MG) BY MOUTH TWICE DAILY AS NEEDED FOR ANXIETY (Patient taking differently: Take 0.5 mg by mouth 2 (two) times daily as needed for anxiety. ) 60 tablet 2  . celecoxib (CELEBREX) 200 MG capsule Take 1 capsule (200 mg total) by mouth daily as needed for moderate pain. 30 capsule 3  . Cholecalciferol (VITAMIN D3) 2000 UNITS capsule Take 1 capsule (2,000 Units total) by mouth daily. 100 capsule 3  . gabapentin (NEURONTIN) 300 MG capsule Take 1 capsule (300 mg total) by mouth 3 (three) times daily. 90 capsule 5  . HYDROcodone-acetaminophen (NORCO) 7.5-325 MG tablet Take 1 tablet by mouth every 8 (eight) hours as needed for severe pain. 90 tablet 0  . ibuprofen (ADVIL) 200 MG tablet Take 600 mg by mouth every 6 (six) hours as needed for moderate pain.    Marland Kitchen ketoconazole (NIZORAL) 2 % cream Apply 1 application topically 2 (two) times daily. In the groin 60 g 1  . meclizine (ANTIVERT) 12.5 MG tablet Take 1-2 tablets (12.5-25 mg total) by mouth 3 (three) times daily as needed for dizziness. 60 tablet 1  . metoprolol succinate (TOPROL-XL) 25 MG 24 hr tablet TAKE 1/2 TABLET(12.5 MG) BY MOUTH AT BEDTIME 45 tablet 3  . pramipexole (MIRAPEX) 0.125 MG tablet Take 1-2 tablets (0.125-0.25 mg total) by mouth at bedtime. 60 tablet 5  . umeclidinium-vilanterol (ANORO ELLIPTA) 62.5-25 MCG/INH AEPB Inhale 1 puff into the lungs daily. 1 each 11   No facility-administered medications prior to visit.    ROS: Review of Systems  Constitutional: Positive for diaphoresis and  fatigue. Negative for activity change, appetite change, chills, fever and unexpected weight change.  HENT: Negative for congestion, mouth sores and sinus pressure.   Eyes: Negative for visual disturbance.  Respiratory: Positive for chest tightness and shortness of breath. Negative for cough.   Cardiovascular: Positive for chest pain and palpitations. Negative for leg swelling.  Gastrointestinal: Negative for abdominal pain and nausea.  Genitourinary: Negative for difficulty urinating, frequency and vaginal pain.  Musculoskeletal: Positive for arthralgias and back pain. Negative for gait problem.  Skin: Negative for pallor and rash.  Neurological: Positive for dizziness and light-headedness. Negative for tremors, weakness, numbness and headaches.  Psychiatric/Behavioral: Negative for confusion and sleep disturbance. The patient is nervous/anxious.     Objective:  BP (!) 122/58 (BP Location: Left Arm, Patient Position: Sitting, Cuff Size: Large)   Pulse 75   Temp 98.7 F (37.1 C) (Oral)   Ht 5\' 2"  (1.575 m)   Wt 192 lb (87.1 kg)   SpO2 98%   BMI 35.12 kg/m   BP Readings from Last 3 Encounters:  02/16/20 (!) 122/58  01/24/20 122/60  11/01/19 128/80    Wt Readings from Last 3 Encounters:  02/16/20 192 lb (87.1 kg)  01/24/20 191 lb (86.6 kg)  11/01/19 198 lb (89.8 kg)    Physical Exam Constitutional:      General: She is not in acute distress.    Appearance: She is well-developed. She is obese.  HENT:  Head: Normocephalic.     Right Ear: External ear normal.     Left Ear: External ear normal.     Nose: Nose normal.  Eyes:     General:        Right eye: No discharge.        Left eye: No discharge.     Conjunctiva/sclera: Conjunctivae normal.     Pupils: Pupils are equal, round, and reactive to light.  Neck:     Thyroid: No thyromegaly.     Vascular: No JVD.     Trachea: No tracheal deviation.  Cardiovascular:     Rate and Rhythm: Normal rate and regular rhythm.      Heart sounds: Normal heart sounds.  Pulmonary:     Effort: No respiratory distress.     Breath sounds: No stridor. No wheezing.  Abdominal:     General: Bowel sounds are normal. There is no distension.     Palpations: Abdomen is soft. There is no mass.     Tenderness: There is no abdominal tenderness. There is no guarding or rebound.  Musculoskeletal:        General: Tenderness present.     Cervical back: Normal range of motion and neck supple.  Lymphadenopathy:     Cervical: No cervical adenopathy.  Skin:    Findings: No erythema or rash.  Neurological:     Mental Status: She is oriented to person, place, and time.     Cranial Nerves: No cranial nerve deficit.     Motor: No abnormal muscle tone.     Coordination: Coordination normal.     Deep Tendon Reflexes: Reflexes normal.  Psychiatric:        Behavior: Behavior normal.        Thought Content: Thought content normal.        Judgment: Judgment normal.    LS tender  Procedure: EKG Indication: chest pain, palpitations Impression: NSR. Supraventricular complexes. Old septal, lat changes.No acute changes.  Lab Results  Component Value Date   WBC 6.8 08/10/2018   HGB 13.6 08/10/2018   HCT 40.8 08/10/2018   PLT 190.0 08/10/2018   GLUCOSE 134 (H) 07/27/2019   CHOL 167 09/17/2017   TRIG 92.0 09/17/2017   HDL 53.00 09/17/2017   LDLCALC 96 09/17/2017   ALT 28 08/10/2018   AST 25 08/10/2018   NA 139 07/27/2019   K 4.0 07/27/2019   CL 106 07/27/2019   CREATININE 0.61 07/27/2019   BUN 13 07/27/2019   CO2 27 07/27/2019   TSH 1.50 09/17/2017   HGBA1C 6.7 11/04/2019    DG Shoulder Left  Result Date: 11/24/2018 CLINICAL DATA:  Fall. Pain. EXAM: LEFT SHOULDER - 2+ VIEW COMPARISON:  None. FINDINGS: There is a mildly displaced, comminuted, and transverse fracture across the surgical neck of the humerus, with slight displacement greater tuberosity. No glenohumeral dislocation. Osteopenia. No adjacent rib fracture or  pneumothorax. Degenerative change AC joint. IMPRESSION: Comminuted, and transverse fracture across the surgical neck of the humerus, with slight displacement. Electronically Signed   By: Staci Righter M.D.   On: 11/24/2018 11:42    Assessment & Plan:   There are no diagnoses linked to this encounter.   No orders of the defined types were placed in this encounter.    Follow-up: No follow-ups on file.  Walker Kehr, MD

## 2020-02-23 ENCOUNTER — Other Ambulatory Visit: Payer: Self-pay | Admitting: Internal Medicine

## 2020-02-23 DIAGNOSIS — R072 Precordial pain: Secondary | ICD-10-CM

## 2020-02-23 DIAGNOSIS — R002 Palpitations: Secondary | ICD-10-CM

## 2020-02-27 DIAGNOSIS — L02224 Furuncle of groin: Secondary | ICD-10-CM | POA: Diagnosis not present

## 2020-03-13 ENCOUNTER — Ambulatory Visit (HOSPITAL_COMMUNITY): Payer: Medicare Other | Attending: Internal Medicine

## 2020-03-13 ENCOUNTER — Other Ambulatory Visit: Payer: Self-pay

## 2020-03-13 DIAGNOSIS — R072 Precordial pain: Secondary | ICD-10-CM

## 2020-03-13 DIAGNOSIS — R002 Palpitations: Secondary | ICD-10-CM | POA: Insufficient documentation

## 2020-03-13 LAB — ECHOCARDIOGRAM COMPLETE
Area-P 1/2: 2.46 cm2
S' Lateral: 2.9 cm

## 2020-03-26 ENCOUNTER — Other Ambulatory Visit: Payer: Self-pay | Admitting: Internal Medicine

## 2020-03-26 DIAGNOSIS — R1031 Right lower quadrant pain: Secondary | ICD-10-CM

## 2020-03-26 DIAGNOSIS — E119 Type 2 diabetes mellitus without complications: Secondary | ICD-10-CM

## 2020-04-10 ENCOUNTER — Other Ambulatory Visit: Payer: Self-pay

## 2020-04-10 ENCOUNTER — Encounter: Payer: Self-pay | Admitting: Cardiology

## 2020-04-10 ENCOUNTER — Ambulatory Visit: Payer: Medicare Other | Admitting: Cardiology

## 2020-04-10 VITALS — BP 138/70 | HR 82 | Ht 62.0 in | Wt 196.2 lb

## 2020-04-10 DIAGNOSIS — Z01812 Encounter for preprocedural laboratory examination: Secondary | ICD-10-CM

## 2020-04-10 DIAGNOSIS — I10 Essential (primary) hypertension: Secondary | ICD-10-CM | POA: Diagnosis not present

## 2020-04-10 DIAGNOSIS — R079 Chest pain, unspecified: Secondary | ICD-10-CM | POA: Diagnosis not present

## 2020-04-10 DIAGNOSIS — I209 Angina pectoris, unspecified: Secondary | ICD-10-CM | POA: Diagnosis not present

## 2020-04-10 MED ORDER — METOPROLOL TARTRATE 100 MG PO TABS
100.0000 mg | ORAL_TABLET | Freq: Once | ORAL | 0 refills | Status: DC
Start: 2020-04-10 — End: 2020-04-11

## 2020-04-10 NOTE — Progress Notes (Signed)
Cardiology Office Note:    Date:  04/10/2020   ID:  Amanda Arnold, DOB [birth year], MRN [id number]  PCP:  Cassandria Anger, MD  [facility] Cardiologist:  Candee Furbish, MD  [facility] Electrophysiologist:  None   Referring MD: Cassandria Anger, MD     History of Present Illness:    Amanda Arnold is a 74 y.o. female here for the evaluation of palpitations, chest tightness at the request of Dr. Alain Marion.  In review of his office note from 02/16/2020 she was having palpitations dizziness with sweats and chest tightness off and on and not feeling "" on occasion.  Has had some meclizine that seemed to help.  Complained of headaches.  Felt like HR went way down. Felt like a band around waist. Toprol spaced out Feeela bst  Tired with ambuilation  Tob pack here and there.   Father had MI in his 93's. ASA 81 seemed to help.   Past Medical History:  Diagnosis Date  . Abdominal pain, epigastric 12/18/2009  . ANXIETY 05/25/2006  . BRONCHITIS, ACUTE 12/08/2007  . COPD 05/25/2006  . DEPRESSION 01/02/2006  . ESOTROPIA, LEFT EYE 1946/06/15  . FIBROMYALGIA 04/02/2006  . Headache(784.0) 12/08/2007  . HYPERGLYCEMIA 05/15/2010  . HYPERLIPIDEMIA 04/30/2006  . HYPERTENSION 05/25/2006  . Hypertension   . INSOMNIA, PERSISTENT 02/22/2008  . Lazy eye    left  . LOW BACK PAIN 01/27/2007  . Migraines   . 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 OBSTRUCTIVE SLEEP APNEA 04/17/2010   does not use CPAP  . OSTEOARTHRITIS 05/25/2006  . Polyuria   . Pre-diabetes   . Psychosomatic disease 2011  . 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 T M J 05/11/2009    Past Surgical History:  Procedure Laterality Date  . CHOLECYSTECTOMY    . MASS EXCISION Left 10/06/2016   Procedure: EXCISION LEFT BUTTOCKS MASS;  Surgeon: Alphonsa Overall, MD;  Location: [place];  Service: General;  Laterality: Left;    Current Medications: Current Meds  Medication Sig  . ALPRAZolam (XANAX) 0.5 MG tablet  TAKE 1 TABLET(0.5 MG) BY MOUTH TWICE DAILY AS NEEDED FOR ANXIETY  . celecoxib (CELEBREX) 200 MG capsule Take 1 capsule (200 mg total) by mouth daily as needed for moderate pain.  . Cholecalciferol (VITAMIN D3) 2000 UNITS capsule Take 1 capsule (2,000 Units total) by mouth daily.  Marland Kitchen gabapentin (NEURONTIN) 300 MG capsule Take 1 capsule (300 mg total) by mouth 3 (three) times daily.  Marland Kitchen HYDROcodone-acetaminophen (NORCO) 7.5-325 MG tablet Take 1 tablet by mouth every 8 (eight) hours as needed for severe pain.  Marland Kitchen ibuprofen (ADVIL) 200 MG tablet Take 600 mg by mouth every 6 (six) hours as needed for moderate pain.  Marland Kitchen ketoconazole (NIZORAL) 2 % cream Apply 1 application topically 2 (two) times daily. In the groin  . meclizine (ANTIVERT) 12.5 MG tablet Take 1-2 tablets (12.5-25 mg total) by mouth 3 (three) times daily as needed for dizziness.  . metoprolol succinate (TOPROL-XL) 25 MG 24 hr tablet TAKE 1/2 TABLET(12.5 MG) BY MOUTH bid  . pramipexole (MIRAPEX) 0.125 MG tablet Take 1-2 tablets (0.125-0.25 mg total) by mouth at bedtime.  Marland Kitchen umeclidinium-vilanterol (ANORO ELLIPTA) 62.5-25 MCG/INH AEPB Inhale 1 puff into the lungs daily.     Allergies:   Morphine and related, Bupropion hcl, Ezetimibe, Metformin and related, Naproxen, Trazodone hcl, and Tramadol hcl   Social History   Socioeconomic History  . Marital status: Married    Spouse name: Not on file  .  Number of children: 5  . Years of education: Not on file  . Highest education level: Not on file  Occupational History  . Occupation: house wife    Employer: UNEMPLOYED  Tobacco Use  . Smoking status: Current Some Day Smoker    Types: Cigarettes    Last attempt to quit: 07/10/2013    Years since quitting: 6.7  . Smokeless tobacco: Current User    Types: Snuff  . Tobacco comment: smokes 2-4 cigs, maybe more when anxious  Vaping Use  . Vaping Use: Never used  Substance and Sexual Activity  . Alcohol use: No    Alcohol/week: 0.0 standard  drinks  . Drug use: No  . Sexual activity: Not Currently  Other Topics Concern  . Not on file  Social History Narrative  . Not on file   Social Determinants of Health   Financial Resource Strain:   . Difficulty of Paying Living Expenses: Not on file  Food Insecurity:   . Worried About Charity fundraiser in the Last Year: Not on file  . Ran Out of Food in the Last Year: Not on file  Transportation Needs:   . Lack of Transportation (Medical): Not on file  . Lack of Transportation (Non-Medical): Not on file  Physical Activity:   . Days of Exercise per Week: Not on file  . Minutes of Exercise per Session: Not on file  Stress:   . Feeling of Stress : Not on file  Social Connections:   . Frequency of Communication with Friends and Family: Not on file  . Frequency of Social Gatherings with Friends and Family: Not on file  . Attends Religious Services: Not on file  . Active Member of Clubs or Organizations: Not on file  . Attends Archivist Meetings: Not on file  . Marital Status: Not on file     Family History: The patient's family history includes Heart attack in her father; Heart disease in her mother; Hypertension in her brother. There is no history of Colon cancer or Stomach cancer.  ROS:   Please see the history of present illness.     All other systems reviewed and are negative.  EKGs/Labs/Other Studies Reviewed:    The following studies were reviewed today: Echocardiogram 03/13/2020  1. Left ventricular ejection fraction by 3D volume is 63 %. The left  ventricle has normal function. The left ventricle has no regional wall  motion abnormalities. Left ventricular diastolic parameters are consistent  with Grade I diastolic dysfunction  (impaired relaxation).  2. Right ventricular systolic function is normal. The right ventricular  size is normal. Tricuspid regurgitation signal is inadequate for assessing  PA pressure.  3. The mitral valve is normal in  structure. Trivial mitral valve  regurgitation. No evidence of mitral stenosis.  4. The aortic valve is grossly normal. Aortic valve regurgitation is not  visualized. No aortic stenosis is present.  5. The inferior vena cava is normal in size with greater than 50%  respiratory variability, suggesting right atrial pressure of 3 mmHg.   EKG:  EKG from 02/24/2020 showed sinus rhythm PACs heart rate 80 with poor R wave progression possible lateral infarct pattern.  Recent Labs: 02/16/2020: ALT 21; BUN 21; Creat 0.72; Hemoglobin 13.3; Platelets 163; Potassium 3.9; Sodium 140; TSH 1.24  Recent Lipid Panel    Component Value Date/Time   CHOL 167 09/17/2017 0952   TRIG 92.0 09/17/2017 0952   HDL 53.00 09/17/2017 0952   CHOLHDL 3 09/17/2017  5929   VLDL 18.4 09/17/2017 0952   LDLCALC 96 09/17/2017 0952    Physical Exam:    VS:  BP 138/70   Pulse 82   Ht 5' 2"  (1.575 m)   Wt 196 lb 3.2 oz (89 kg)   SpO2 98%   BMI 35.89 kg/m     Wt Readings from Last 3 Encounters:  04/10/20 196 lb 3.2 oz (89 kg)  02/16/20 192 lb (87.1 kg)  01/24/20 191 lb (86.6 kg)     GEN:  Well nourished, well developed in no acute distress HEENT: Normal NECK: No JVD; No carotid bruits LYMPHATICS: No lymphadenopathy CARDIAC: RRR, no murmurs, rubs, gallops RESPIRATORY:  Clear to auscultation without rales, wheezing or rhonchi  ABDOMEN: Soft, non-tender, non-distended MUSCULOSKELETAL:  No edema; No deformity  SKIN: Warm and dry NEUROLOGIC:  Alert and oriented x 3 PSYCHIATRIC:  Normal affect   ASSESSMENT:    1. Angina pectoris ([place])   2. Essential hypertension   3. Chest pain, unspecified type   4. Pre-procedure lab exam    PLAN:    In order of problems listed above:  Angina -Occasional bandlike pattern across her chest wall.  Long-term smoker.  Father had MI.  I think it makes sense for [place] to proceed with coronary CT scan with possible FFR analysis. -Echocardiogram personally reviewed as above shows  normal ejection fraction.  Overall reassuring. -Prior creatinine 0.72 potassium 3.9 ALT 21 hemoglobin 13.3, TSH 1.24, prior LDL 96 in 2019.   Essential hypertension -Currently splitting the Toprol-XL 25 mg twice a day.  Feels better on this combination.  Medication Adjustments/Labs and Tests Ordered: Current medicines are reviewed at length with the patient today.  Concerns regarding medicines are outlined above.  Orders Placed This Encounter  Procedures  . CT CORONARY MORPH W/CTA COR W/SCORE W/CA W/CM &/OR WO/CM  . CT CORONARY FRACTIONAL FLOW RESERVE DATA PREP  . CT CORONARY FRACTIONAL FLOW RESERVE FLUID ANALYSIS  . Basic metabolic panel   Meds ordered this encounter  Medications  . metoprolol tartrate (LOPRESSOR) 100 MG tablet    Sig: Take 1 tablet (100 mg total) by mouth once for 1 dose. Take one tablet (2) hours before your CT scan    Dispense:  1 tablet    Refill:  0    Patient Instructions  Medication Instructions:  The current medical regimen is effective;  continue present plan and medications.  *If you need a refill on your cardiac medications before your next appointment, please call your pharmacy*  Lab Work: You will need blood work before your CT scan. (BMP) If you have labs (blood work) drawn today and your tests are completely normal, you will receive your results only by: Marland Kitchen MyChart Message (if you have MyChart) OR . A paper copy in the mail If you have any lab test that is abnormal or we need to change your treatment, we will call you to review the results.   Testing/Procedures: Your cardiac CT will be scheduled at:   [facility] [address] [place], [place] [address] [phone]  Please arrive at the [facility] main entrance of [facility] 30 minutes prior to test start time. Proceed to the [facility] Radiology Department (first floor) to check-in and test prep.  Please follow these instructions carefully (unless otherwise  directed):  On the Night Before the Test: . Be sure to Drink plenty of water. . Do not consume any caffeinated/decaffeinated beverages or chocolate 12 hours  prior to your test. . Do not take any antihistamines 12 hours prior to your test.  On the Day of the Test: . Drink plenty of water. Do not drink any water within one hour of the test. . Do not eat any food 4 hours prior to the test. . You may take your regular medications prior to the test.  . Take metoprolol (Lopressor 100 mg) two hours prior to test. . HOLD Furosemide/Hydrochlorothiazide morning of the test. . FEMALES- please wear underwire-free bra if available      After the Test: . Drink plenty of water. . After receiving IV contrast, you may experience a mild flushed feeling. This is normal. . On occasion, you may experience a mild rash up to 24 hours after the test. This is not dangerous. If this occurs, you can take Benadryl 25 mg and increase your fluid intake. . If you experience trouble breathing, this can be serious. If it is severe call 911 IMMEDIATELY. If it is mild, please call our office. . If you take any of these medications: Glipizide/Metformin, Avandament, Glucavance, please do not take 48 hours after completing test unless otherwise instructed.  Once we have confirmed authorization from your insurance company, we will call you to set up a date and time for your test. Based on how quickly your insurance processes prior authorizations requests, please allow up to 4 weeks to be contacted for scheduling your Cardiac CT appointment. Be advised that routine Cardiac CT appointments could be scheduled as many as 8 weeks after your provider has ordered it.  For non-scheduling related questions, please contact the cardiac imaging nurse navigator should you have any questions/concerns: Marchia Bond, Cardiac Imaging Nurse Navigator Burley Saver, Interim Cardiac Imaging Nurse [place] and Vascular  Services Direct Office Dial: [phone]   For scheduling needs, including cancellations and rescheduling, please call Vivien Rota at [phone], option 3.   Follow-Up: Follow up will be determined after the above testing.  We recommend signing up for the patient portal called "MyChart".  Sign up information is provided on this After Visit Summary.  MyChart is used to connect with patients for Virtual Visits (Telemedicine).  Patients are able to view lab/test results, encounter notes, upcoming appointments, etc.  Non-urgent messages can be sent to your provider as well.   To learn more about what you can do with MyChart, go to [email or web address].    Thank you for choosing [facility]!!         Signed, Candee Furbish, MD  04/10/2020 3:04 PM    [place]

## 2020-04-10 NOTE — Patient Instructions (Signed)
Medication Instructions:  The current medical regimen is effective;  continue present plan and medications.  *If you need a refill on your cardiac medications before your next appointment, please call your pharmacy*  Lab Work: You will need blood work before your CT scan. (BMP) If you have labs (blood work) drawn today and your tests are completely normal, you will receive your results only by: Marland Kitchen MyChart Message (if you have MyChart) OR . A paper copy in the mail If you have any lab test that is abnormal or we need to change your treatment, we will call you to review the results.   Testing/Procedures: Your cardiac CT will be scheduled at:   [facility] [address] [place], [place] [address] [phone]  Please arrive at the [facility] main entrance of [facility] 30 minutes prior to test start time. Proceed to the [facility] Radiology Department (first floor) to check-in and test prep.  Please follow these instructions carefully (unless otherwise directed):  On the Night Before the Test: . Be sure to Drink plenty of water. . Do not consume any caffeinated/decaffeinated beverages or chocolate 12 hours prior to your test. . Do not take any antihistamines 12 hours prior to your test.  On the Day of the Test: . Drink plenty of water. Do not drink any water within one hour of the test. . Do not eat any food 4 hours prior to the test. . You may take your regular medications prior to the test.  . Take metoprolol (Lopressor 100 mg) two hours prior to test. . HOLD Furosemide/Hydrochlorothiazide morning of the test. . FEMALES- please wear underwire-free bra if available      After the Test: . Drink plenty of water. . After receiving IV contrast, you may experience a mild flushed feeling. This is normal. . On occasion, you may experience a mild rash up to 24 hours after the test. This is not dangerous. If this occurs, you can take Benadryl 25 mg and increase  your fluid intake. . If you experience trouble breathing, this can be serious. If it is severe call 911 IMMEDIATELY. If it is mild, please call our office. . If you take any of these medications: Glipizide/Metformin, Avandament, Glucavance, please do not take 48 hours after completing test unless otherwise instructed.  Once we have confirmed authorization from your insurance company, we will call you to set up a date and time for your test. Based on how quickly your insurance processes prior authorizations requests, please allow up to 4 weeks to be contacted for scheduling your Cardiac CT appointment. Be advised that routine Cardiac CT appointments could be scheduled as many as 8 weeks after your provider has ordered it.  For non-scheduling related questions, please contact the cardiac imaging nurse navigator should you have any questions/concerns: Marchia Bond, Cardiac Imaging Nurse Navigator Burley Saver, Interim Cardiac Imaging Nurse [place] and Vascular Services Direct Office Dial: [phone]   For scheduling needs, including cancellations and rescheduling, please call Vivien Rota at [phone], option 3.   Follow-Up: Follow up will be determined after the above testing.  We recommend signing up for the patient portal called "MyChart".  Sign up information is provided on this After Visit Summary.  MyChart is used to connect with patients for Virtual Visits (Telemedicine).  Patients are able to view lab/test results, encounter notes, upcoming appointments, etc.  Non-urgent messages can be sent to your provider as well.   To learn more  about what you can do with MyChart, go to [email or web address].    Thank you for choosing [place]!!

## 2020-04-11 ENCOUNTER — Other Ambulatory Visit: Payer: Self-pay | Admitting: Cardiology

## 2020-04-12 DIAGNOSIS — L0291 Cutaneous abscess, unspecified: Secondary | ICD-10-CM | POA: Diagnosis not present

## 2020-04-12 DIAGNOSIS — L820 Inflamed seborrheic keratosis: Secondary | ICD-10-CM | POA: Diagnosis not present

## 2020-04-12 DIAGNOSIS — L905 Scar conditions and fibrosis of skin: Secondary | ICD-10-CM | POA: Diagnosis not present

## 2020-04-14 ENCOUNTER — Other Ambulatory Visit: Payer: Self-pay | Admitting: Cardiology

## 2020-04-16 ENCOUNTER — Other Ambulatory Visit: Payer: Self-pay | Admitting: Cardiology

## 2020-04-20 ENCOUNTER — Other Ambulatory Visit: Payer: Medicare Other | Admitting: *Deleted

## 2020-04-20 ENCOUNTER — Telehealth: Payer: Self-pay | Admitting: Cardiology

## 2020-04-20 ENCOUNTER — Other Ambulatory Visit: Payer: Self-pay

## 2020-04-20 DIAGNOSIS — I10 Essential (primary) hypertension: Secondary | ICD-10-CM | POA: Diagnosis not present

## 2020-04-20 DIAGNOSIS — Z01812 Encounter for preprocedural laboratory examination: Secondary | ICD-10-CM

## 2020-04-20 DIAGNOSIS — I209 Angina pectoris, unspecified: Secondary | ICD-10-CM

## 2020-04-20 LAB — BASIC METABOLIC PANEL
BUN/Creatinine Ratio: 25 (ref 12–28)
BUN: 16 mg/dL (ref 8–27)
CO2: 22 mmol/L (ref 20–29)
Calcium: 8.9 mg/dL (ref 8.7–10.3)
Chloride: 105 mmol/L (ref 96–106)
Creatinine, Ser: 0.65 mg/dL (ref 0.57–1.00)
GFR calc Af Amer: 103 mL/min/{1.73_m2} (ref >59)
GFR calc non Af Amer: 90 mL/min/{1.73_m2} (ref >59)
Glucose: 155 mg/dL — ABNORMAL HIGH (ref 65–99)
Potassium: 4 mmol/L (ref 3.5–5.2)
Sodium: 140 mmol/L (ref 134–144)

## 2020-04-20 NOTE — Telephone Encounter (Signed)
Pt. Is requesting a call about her upcoming procedure. She says she has some questions about it.

## 2020-04-20 NOTE — Telephone Encounter (Signed)
Called patient back about her question. Patient wanted to know if she needed to take both metoprolols the day of her CT. Informed patient to take the metoprolol tartrate 100 mg  two hours prior to test and she can hold off and take her metoprolol succinate 25 mg after her test if her BP and HR are okay. Patient agreed to plan.

## 2020-04-23 ENCOUNTER — Telehealth (HOSPITAL_COMMUNITY): Payer: Self-pay | Admitting: Emergency Medicine

## 2020-04-23 NOTE — Telephone Encounter (Signed)
Reaching out to patient to offer assistance regarding upcoming cardiac imaging study; pt verbalizes understanding of appt date/time, parking situation and where to check in, pre-test NPO status and medications ordered, and verified current allergies; name and call back number provided for further questions should they arise Marchia Bond RN Navigator Cardiac Imaging [place] and Vascular [phone] office [phone] cell   Pt asked to hold AM dose of metoprolol succinate. Will take metoprolol tartrate 2 hr prior to scan, Pt verbalized understanding Clarise Cruz

## 2020-04-24 ENCOUNTER — Other Ambulatory Visit: Payer: Self-pay

## 2020-04-24 ENCOUNTER — Ambulatory Visit (HOSPITAL_COMMUNITY)
Admission: RE | Admit: 2020-04-24 | Discharge: 2020-04-24 | Disposition: A | Discharge status: Home / Self Care | Payer: Medicare Other | Source: Ambulatory Visit | Attending: Cardiology | Admitting: Cardiology

## 2020-04-24 DIAGNOSIS — I2510 Atherosclerotic heart disease of native coronary artery without angina pectoris: Secondary | ICD-10-CM | POA: Diagnosis not present

## 2020-04-24 DIAGNOSIS — R911 Solitary pulmonary nodule: Secondary | ICD-10-CM | POA: Insufficient documentation

## 2020-04-24 DIAGNOSIS — R079 Chest pain, unspecified: Secondary | ICD-10-CM | POA: Insufficient documentation

## 2020-04-24 MED ORDER — NITROGLYCERIN 0.4 MG SL SUBL
SUBLINGUAL_TABLET | SUBLINGUAL | Status: AC
  Filled 2020-04-24: qty 2

## 2020-04-24 MED ORDER — IOHEXOL 350 MG/ML SOLN
80.0000 mL | Freq: Once | INTRAVENOUS | Status: AC | PRN
  Administered 2020-04-24: 80 mL via INTRAVENOUS

## 2020-04-24 MED ORDER — NITROGLYCERIN 0.4 MG SL SUBL
0.8000 mg | SUBLINGUAL_TABLET | Freq: Once | SUBLINGUAL | Status: AC
  Administered 2020-04-24: 0.8 mg via SUBLINGUAL

## 2020-04-24 NOTE — Progress Notes (Signed)
Pt felt very nauseas after test. She did not vomit but she did have some dry heaves. Pt is having snack now and tolerating well. Daughter is at bedside

## 2020-04-25 ENCOUNTER — Telehealth: Payer: Self-pay | Admitting: *Deleted

## 2020-04-25 DIAGNOSIS — E785 Hyperlipidemia, unspecified: Secondary | ICD-10-CM

## 2020-04-25 DIAGNOSIS — R911 Solitary pulmonary nodule: Secondary | ICD-10-CM

## 2020-04-25 DIAGNOSIS — F17200 Nicotine dependence, unspecified, uncomplicated: Secondary | ICD-10-CM

## 2020-04-25 DIAGNOSIS — Z79899 Other long term (current) drug therapy: Secondary | ICD-10-CM

## 2020-04-25 MED ORDER — ROSUVASTATIN CALCIUM 10 MG PO TABS: 10.0000 mg | ORAL_TABLET | Freq: Every day | ORAL | 3 refills | Status: DC

## 2020-04-25 NOTE — Telephone Encounter (Signed)
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  Would recommend starting Crestor 10mg  PO QD for stabilization and to recheck Lipids and ALT in 3 months.   Also 46mm lung nodule noted. At the recommendation of radiology, lets check a non contrast CT in 12 months given smoking history.   Amanda Furbish, MD    Pt and husband are aware of results.  RX sent into pharmacy of choice, repeat lab 07/27/20.  Order placed for non contrast CT in 12 months.  Pt c/o cold like s/s with cough that started yesterday.  Advised to f/u with PCP for further evaluation and treatment.

## 2020-05-14 ENCOUNTER — Telehealth: Payer: Self-pay | Admitting: Internal Medicine

## 2020-05-14 NOTE — Telephone Encounter (Signed)
Updated med list w/correct directions.Marland KitchenJohny Arnold

## 2020-05-14 NOTE — Telephone Encounter (Signed)
Patient states she has been taking half a pill in the morning and half a pill at night and her pharmacy wants that listed on the bottle since that is not the current instructions. Also stating its not time for a refill yet and she is out of the medication.  metoprolol succinate (TOPROL-XL) 25 MG 24 hr tablet [facility] [place], [place] [address] Phone:  [phone]  Fax:  [phone]

## 2020-05-16 MED ORDER — METOPROLOL SUCCINATE ER 25 MG PO TB24: ORAL_TABLET | ORAL | 1 refills | Status: DC

## 2020-05-16 NOTE — Telephone Encounter (Signed)
Refill sent to [facility].../lmb 

## 2020-05-16 NOTE — Telephone Encounter (Signed)
Patient is out of medication and also needs it refilled

## 2020-05-16 NOTE — Addendum Note (Signed)
Addended by: Earnstine Regal on: 05/16/2020 11:01 AM   Modules accepted: Orders

## 2020-05-28 ENCOUNTER — Other Ambulatory Visit: Payer: Self-pay

## 2020-05-28 ENCOUNTER — Encounter: Payer: Self-pay | Admitting: Internal Medicine

## 2020-05-28 ENCOUNTER — Ambulatory Visit (INDEPENDENT_AMBULATORY_CARE_PROVIDER_SITE_OTHER): Payer: Medicare Other | Admitting: Internal Medicine

## 2020-05-28 DIAGNOSIS — E119 Type 2 diabetes mellitus without complications: Secondary | ICD-10-CM

## 2020-05-28 DIAGNOSIS — I10 Essential (primary) hypertension: Secondary | ICD-10-CM

## 2020-05-28 DIAGNOSIS — I2583 Coronary atherosclerosis due to lipid rich plaque: Secondary | ICD-10-CM

## 2020-05-28 DIAGNOSIS — M5440 Lumbago with sciatica, unspecified side: Secondary | ICD-10-CM

## 2020-05-28 DIAGNOSIS — I2510 Atherosclerotic heart disease of native coronary artery without angina pectoris: Secondary | ICD-10-CM | POA: Insufficient documentation

## 2020-05-28 DIAGNOSIS — R911 Solitary pulmonary nodule: Secondary | ICD-10-CM | POA: Insufficient documentation

## 2020-05-28 DIAGNOSIS — G8929 Other chronic pain: Secondary | ICD-10-CM

## 2020-05-28 MED ORDER — HYDROCODONE-ACETAMINOPHEN 7.5-325 MG PO TABS: 1.0000 | ORAL_TABLET | Freq: Three times a day (TID) | ORAL | 0 refills | Status: DC | PRN

## 2020-05-28 MED ORDER — GABAPENTIN 300 MG PO CAPS
300.0000 mg | ORAL_CAPSULE | Freq: Three times a day (TID) | ORAL | 5 refills | Status: DC
Start: 2020-05-28 — End: 2020-09-04

## 2020-05-28 NOTE — Assessment & Plan Note (Signed)
Norco prn, Gabapentin  Potential benefits of a long term opioids use as well as potential risks (i.e. addiction risk, apnea etc) and complications (i.e. Somnolence, constipation and others) were explained to the patient and were aknowledged.

## 2020-05-28 NOTE — Progress Notes (Signed)
Subjective:  Patient ID: Amanda Arnold, female    DOB: 1946/03/01  Age: 74 y.o. MRN: [id number]  CC: Follow-up (4 MONTH F/U)   HPI Amanda Arnold presents for CAD, pulm nodule, LBP f/u  Outpatient Medications Prior to Visit  Medication Sig Dispense Refill  . celecoxib (CELEBREX) 200 MG capsule Take 1 capsule (200 mg total) by mouth daily as needed for moderate pain. 30 capsule 3  . Cholecalciferol (VITAMIN D3) 2000 UNITS capsule Take 1 capsule (2,000 Units total) by mouth daily. 100 capsule 3  . gabapentin (NEURONTIN) 300 MG capsule Take 1 capsule (300 mg total) by mouth 3 (three) times daily. 90 capsule 5  . HYDROcodone-acetaminophen (NORCO) 7.5-325 MG tablet Take 1 tablet by mouth every 8 (eight) hours as needed for severe pain. 90 tablet 0  . ibuprofen (ADVIL) 200 MG tablet Take 600 mg by mouth every 6 (six) hours as needed for moderate pain.    . metoprolol succinate (TOPROL-XL) 25 MG 24 hr tablet TAKE 1/2 TABLET(12.5 MG) BY MOUTH IN AM AND 1/2 PM 90 tablet 1  . metoprolol tartrate (LOPRESSOR) 100 MG tablet TAKE 1 TABLET BY MOUTH 2 HOURS BEFORE YOUR CT SCAN 1 tablet 0  . rosuvastatin (CRESTOR) 10 MG tablet Take 1 tablet (10 mg total) by mouth daily. 90 tablet 3  . ALPRAZolam (XANAX) 0.5 MG tablet TAKE 1 TABLET(0.5 MG) BY MOUTH TWICE DAILY AS NEEDED FOR ANXIETY 60 tablet 1  . ketoconazole (NIZORAL) 2 % cream Apply 1 application topically 2 (two) times daily. In the groin (Patient not taking: Reported on 05/28/2020) 60 g 1  . meclizine (ANTIVERT) 12.5 MG tablet Take 1-2 tablets (12.5-25 mg total) by mouth 3 (three) times daily as needed for dizziness. (Patient not taking: Reported on 05/28/2020) 60 tablet 1  . pramipexole (MIRAPEX) 0.125 MG tablet Take 1-2 tablets (0.125-0.25 mg total) by mouth at bedtime. (Patient not taking: Reported on 05/28/2020) 60 tablet 5  . umeclidinium-vilanterol (ANORO ELLIPTA) 62.5-25 MCG/INH AEPB Inhale 1 puff into the lungs daily. (Patient not taking:  Reported on 05/28/2020) 1 each 11   No facility-administered medications prior to visit.    ROS: Review of Systems  Constitutional: Positive for fatigue. Negative for activity change, appetite change, chills and unexpected weight change.  HENT: Negative for congestion, mouth sores and sinus pressure.   Eyes: Negative for visual disturbance.  Respiratory: Positive for shortness of breath. Negative for cough and chest tightness.   Cardiovascular: Negative for chest pain, palpitations and leg swelling.  Gastrointestinal: Negative for abdominal pain and nausea.  Genitourinary: Negative for difficulty urinating, frequency and vaginal pain.  Musculoskeletal: Negative for back pain and gait problem.  Skin: Negative for pallor and rash.  Neurological: Negative for dizziness, tremors, weakness, numbness and headaches.  Psychiatric/Behavioral: Negative for confusion and sleep disturbance.    Objective:  BP 138/80 (BP Location: Left Arm)   Pulse 63   Temp 97.6 F (36.4 C) (Oral)   Wt 197 lb 12.8 oz (89.7 kg)   SpO2 97%   BMI 36.18 kg/m   BP Readings from Last 3 Encounters:  05/28/20 138/80  04/24/20 124/72  04/10/20 138/70    Wt Readings from Last 3 Encounters:  05/28/20 197 lb 12.8 oz (89.7 kg)  04/10/20 196 lb 3.2 oz (89 kg)  02/16/20 192 lb (87.1 kg)    Physical Exam Constitutional:      General: She is not in acute distress.    Appearance: She is well-developed. She is  obese.  HENT:     Head: Normocephalic.     Right Ear: External ear normal.     Left Ear: External ear normal.     Nose: Nose normal.  Eyes:     General:        Right eye: No discharge.        Left eye: No discharge.     Conjunctiva/sclera: Conjunctivae normal.     Pupils: Pupils are equal, round, and reactive to light.  Neck:     Thyroid: No thyromegaly.     Vascular: No JVD.     Trachea: No tracheal deviation.  Cardiovascular:     Rate and Rhythm: Normal rate and regular rhythm.     Heart  sounds: Normal heart sounds.  Pulmonary:     Effort: No respiratory distress.     Breath sounds: No stridor. No wheezing.  Abdominal:     General: Bowel sounds are normal. There is no distension.     Palpations: Abdomen is soft. There is no mass.     Tenderness: There is no abdominal tenderness. There is no guarding or rebound.  Musculoskeletal:        General: Tenderness present.     Cervical back: Normal range of motion and neck supple.  Lymphadenopathy:     Cervical: No cervical adenopathy.  Skin:    Findings: No erythema or rash.  Neurological:     Cranial Nerves: No cranial nerve deficit.     Motor: No abnormal muscle tone.     Coordination: Coordination normal.     Deep Tendon Reflexes: Reflexes normal.  Psychiatric:        Behavior: Behavior normal.        Thought Content: Thought content normal.        Judgment: Judgment normal.   LS w/pain  Lab Results  Component Value Date   WBC 6.6 02/16/2020   HGB 13.3 02/16/2020   HCT 40.0 02/16/2020   PLT 163 02/16/2020   GLUCOSE 155 (H) 04/20/2020   CHOL 167 09/17/2017   TRIG 92.0 09/17/2017   HDL 53.00 09/17/2017   LDLCALC 96 09/17/2017   ALT 21 02/16/2020   AST 23 02/16/2020   NA 140 04/20/2020   K 4.0 04/20/2020   CL 105 04/20/2020   CREATININE 0.65 04/20/2020   BUN 16 04/20/2020   CO2 22 04/20/2020   TSH 1.24 02/16/2020   HGBA1C 6.7 11/04/2019    CT CORONARY MORPH W/CTA COR W/SCORE W/CA W/CM &/OR WO/CM  Addendum Date: 04/24/2020   ADDENDUM REPORT: 04/24/2020 13:00 CLINICAL DATA:  74 year old with chest pain EXAM: Cardiac/Coronary  CTA TECHNIQUE: The patient was scanned on a [employer]. FINDINGS: A 120 kV prospective scan was triggered in the descending thoracic aorta at 111 HU's. Axial non-contrast 3 mm slices were carried out through the heart. The data set was analyzed on a dedicated work station and scored using the [place]. Gantry rotation speed was 250 msecs and collimation was .6 mm.  Beta blockade and 0.8 mg of sl NTG was given. The 3D data set was reconstructed in 5% intervals of the 67-82 % of the R-R cycle. Diastolic phases were analyzed on a dedicated work station using MPR, MIP and VRT modes. The patient received 80 cc of contrast. Aorta:  Normal size.  No calcifications.  No dissection. Aortic Valve:  Trileaflet.  No calcifications. Coronary Arteries:  Normal coronary origin.  Right dominance. RCA is a large dominant artery that  gives rise to PDA and PLA. Mild RCA prox/mid calcified plaque, 0-24% stenosis. Left main is a large artery that gives rise to LAD and LCX arteries. LAD is a large vessel that has no plaque. LCX is a non-dominant artery that gives rise to one large OM1 branch. There is no plaque. Other findings: Normal pulmonary vein drainage into the left atrium. Normal left atrial appendage without a thrombus. Normal size of the pulmonary artery. Please see radiology report for non cardiac findings. IMPRESSION: 1. Coronary calcium score of 8. This was 58 percentile for age and sex matched control. 2. Normal coronary origin with right dominance. 3.  Mild RCA calcified plaque, 0-24% stenosis. 4. CAD-RADS 1. Minimal non-obstructive CAD (0-24%). Consider non-atherosclerotic causes of chest pain. Consider preventive therapy and risk factor modification. Candee Furbish, MD [facility] Electronically Signed   By: Candee Furbish MD   On: 04/24/2020 13:00   Result Date: 04/24/2020 EXAM: OVER-READ INTERPRETATION  CT CHEST The following report is an over-read performed by radiologist Dr. Vinnie Langton of [facility], [place] on 04/24/2020. This over-read does not include interpretation of cardiac or coronary anatomy or pathology. The coronary calcium score/coronary CTA interpretation by the cardiologist is attached. COMPARISON:  Chest CT 12/21/2009. FINDINGS: 6 x 4 mm (mean diameter 5 mm) right middle lobe pulmonary nodule (axial image 19 of series 12). Within the visualized portions of the  thorax there are no other larger more suspicious appearing pulmonary nodules or masses, there is no acute consolidative airspace disease, no pleural effusions, no pneumothorax and no lymphadenopathy. Visualized portions of the upper abdomen are unremarkable. There are no aggressive appearing lytic or blastic lesions noted in the visualized portions of the skeleton. IMPRESSION: 1. 5 mm right middle lobe pulmonary nodule, nonspecific, but statistically likely benign. No follow-up needed if patient is low-risk. Non-contrast chest CT can be considered in 12 months if patient is high-risk. This recommendation follows the consensus statement: Guidelines for Management of Incidental Pulmonary Nodules Detected on CT Images: From the Fleischner Society 2017; Radiology 2017; 284:228-243. Electronically Signed: By: Vinnie Langton M.D. On: 04/24/2020 12:29    Assessment & Plan:     Follow-up: No follow-ups on file.  Walker Kehr, MD

## 2020-05-28 NOTE — Assessment & Plan Note (Signed)
  On diet  

## 2020-05-28 NOTE — Assessment & Plan Note (Signed)
10/21 CT  IMPRESSION: 1. 5 mm right middle lobe pulmonary nodule, nonspecific, but statistically likely benign. No follow-up needed if patient is low-risk. Non-contrast chest CT can be considered in 12 months if patient is high-risk.  Dr Marlou Porch ordered CT in 12 mo

## 2020-05-28 NOTE — Assessment & Plan Note (Signed)
BP Readings from Last 3 Encounters:  05/28/20 138/80  04/24/20 124/72  04/10/20 138/70

## 2020-05-28 NOTE — Assessment & Plan Note (Signed)
10/21 CT IMPRESSION: 1. Coronary calcium score of 8. This was 4 percentile for age and sex matched control. 2. Normal coronary origin with right dominance. 3.  Mild RCA calcified plaque, 0-24% stenosis. 4. CAD-RADS 1. Minimal non-obstructive CAD (0-24%). Consider non-atherosclerotic causes of chest pain. Consider preventive therapy and risk factor modification. Candee Furbish, MD [facility]  On Crestor

## 2020-05-30 ENCOUNTER — Telehealth: Payer: Self-pay | Admitting: Internal Medicine

## 2020-05-30 NOTE — Telephone Encounter (Signed)
   Patients daughter Levada Dy calling  , states pharmacy only willing to give patient 7 day supply of HYDROcodone-acetaminophen ([place]) 7.5-325 MG tablet  Please contact patient

## 2020-05-31 ENCOUNTER — Other Ambulatory Visit: Payer: Self-pay

## 2020-05-31 ENCOUNTER — Other Ambulatory Visit: Payer: Medicare Other

## 2020-05-31 ENCOUNTER — Other Ambulatory Visit (INDEPENDENT_AMBULATORY_CARE_PROVIDER_SITE_OTHER): Payer: Medicare Other

## 2020-05-31 ENCOUNTER — Encounter: Payer: Self-pay | Admitting: Internal Medicine

## 2020-05-31 ENCOUNTER — Ambulatory Visit (INDEPENDENT_AMBULATORY_CARE_PROVIDER_SITE_OTHER): Payer: Medicare Other | Admitting: Internal Medicine

## 2020-05-31 DIAGNOSIS — R1031 Right lower quadrant pain: Secondary | ICD-10-CM

## 2020-05-31 LAB — COMPREHENSIVE METABOLIC PANEL
ALT: 15 U/L (ref 0–35)
AST: 23 U/L (ref 0–37)
Albumin: 4.1 g/dL (ref 3.5–5.2)
Alkaline Phosphatase: 68 U/L (ref 39–117)
BUN: 18 mg/dL (ref 6–23)
CO2: 28 mEq/L (ref 19–32)
Calcium: 9.2 mg/dL (ref 8.4–10.5)
Chloride: 105 mEq/L (ref 96–112)
Creatinine, Ser: 0.69 mg/dL (ref 0.40–1.20)
GFR: 86.9 mL/min (ref >60.00)
Glucose, Bld: 108 mg/dL — ABNORMAL HIGH (ref 70–99)
Potassium: 4 mEq/L (ref 3.5–5.1)
Sodium: 139 mEq/L (ref 135–145)
Total Bilirubin: 0.6 mg/dL (ref 0.2–1.2)
Total Protein: 6.9 g/dL (ref 6.0–8.3)

## 2020-05-31 LAB — URINALYSIS
Bilirubin Urine: NEGATIVE
Hgb urine dipstick: NEGATIVE
Ketones, ur: NEGATIVE
Leukocytes,Ua: NEGATIVE
Nitrite: NEGATIVE
Specific Gravity, Urine: >=1.03 — AB (ref 1.000–1.030)
Total Protein, Urine: NEGATIVE
Urine Glucose: NEGATIVE
Urobilinogen, UA: 0.2 (ref 0.0–1.0)
pH: 6 (ref 5.0–8.0)

## 2020-05-31 LAB — CBC WITH DIFFERENTIAL/PLATELET
Basophils Absolute: 0.1 10*3/uL (ref 0.0–0.1)
Basophils Relative: 0.9 % (ref 0.0–3.0)
Eosinophils Absolute: 0.1 10*3/uL (ref 0.0–0.7)
Eosinophils Relative: 2.1 % (ref 0.0–5.0)
HCT: 39.4 % (ref 36.0–46.0)
Hemoglobin: 13.3 g/dL (ref 12.0–15.0)
Lymphocytes Relative: 35 % (ref 12.0–46.0)
Lymphs Abs: 2.3 10*3/uL (ref 0.7–4.0)
MCHC: 33.7 g/dL (ref 30.0–36.0)
MCV: 91.5 fl (ref 78.0–100.0)
Monocytes Absolute: 0.5 10*3/uL (ref 0.1–1.0)
Monocytes Relative: 6.9 % (ref 3.0–12.0)
Neutro Abs: 3.7 10*3/uL (ref 1.4–7.7)
Neutrophils Relative %: 55.1 % (ref 43.0–77.0)
Platelets: 165 10*3/uL (ref 150.0–400.0)
RBC: 4.3 Mil/uL (ref 3.87–5.11)
RDW: 12.8 % (ref 11.5–15.5)
WBC: 6.7 10*3/uL (ref 4.0–10.5)

## 2020-05-31 LAB — SEDIMENTATION RATE: Sed Rate: 11 mm/hr (ref 0–30)

## 2020-05-31 NOTE — Progress Notes (Signed)
Subjective:  Patient ID: Amanda Arnold, female    DOB: Dec 04, 1945  Age: 74 y.o. MRN: [id number]  CC: Flank Pain (RIGHT SIDE)   HPI Amanda Arnold presents for RLQ painx 2 weeks off and on - much worse since last night - did no sleep... Worse w/movements, walking. No pain at rest. C/o nausea...  Outpatient Medications Prior to Visit  Medication Sig Dispense Refill  . celecoxib (CELEBREX) 200 MG capsule Take 1 capsule (200 mg total) by mouth daily as needed for moderate pain. 30 capsule 3  . Cholecalciferol (VITAMIN D3) 2000 UNITS capsule Take 1 capsule (2,000 Units total) by mouth daily. 100 capsule 3  . gabapentin (NEURONTIN) 300 MG capsule Take 1 capsule (300 mg total) by mouth 3 (three) times daily. 90 capsule 5  . HYDROcodone-acetaminophen (NORCO) 7.5-325 MG tablet Take 1 tablet by mouth every 8 (eight) hours as needed for severe pain. 90 tablet 0  . ibuprofen (ADVIL) 200 MG tablet Take 600 mg by mouth every 6 (six) hours as needed for moderate pain.    . metoprolol succinate (TOPROL-XL) 25 MG 24 hr tablet TAKE 1/2 TABLET(12.5 MG) BY MOUTH IN AM AND 1/2 PM 90 tablet 1  . metoprolol tartrate (LOPRESSOR) 100 MG tablet TAKE 1 TABLET BY MOUTH 2 HOURS BEFORE YOUR CT SCAN 1 tablet 0  . rosuvastatin (CRESTOR) 10 MG tablet Take 1 tablet (10 mg total) by mouth daily. 90 tablet 3   No facility-administered medications prior to visit.    ROS: Review of Systems  Constitutional: Negative for activity change, appetite change, chills, fatigue, fever and unexpected weight change.  HENT: Negative for congestion, mouth sores and sinus pressure.   Eyes: Negative for visual disturbance.  Respiratory: Negative for cough and chest tightness.   Cardiovascular: Negative for chest pain and leg swelling.  Gastrointestinal: Positive for abdominal pain and nausea. Negative for abdominal distention and vomiting.  Genitourinary: Negative for difficulty urinating, frequency and vaginal pain.    Musculoskeletal: Negative for back pain and gait problem.  Skin: Negative for pallor and rash.  Neurological: Negative for dizziness, tremors, weakness, numbness and headaches.  Psychiatric/Behavioral: Negative for confusion and sleep disturbance.    Objective:  BP 122/70 (BP Location: Right Arm)   Pulse 65   Temp 98.8 F (37.1 C) (Oral)   Wt 193 lb 6.4 oz (87.7 kg)   SpO2 97%   BMI 35.37 kg/m   BP Readings from Last 3 Encounters:  05/31/20 122/70  05/28/20 138/80  04/24/20 124/72    Wt Readings from Last 3 Encounters:  05/31/20 193 lb 6.4 oz (87.7 kg)  05/28/20 197 lb 12.8 oz (89.7 kg)  04/10/20 196 lb 3.2 oz (89 kg)    Physical Exam Constitutional:      General: She is not in acute distress.    Appearance: She is well-developed. She is obese. She is not ill-appearing or toxic-appearing.  HENT:     Head: Normocephalic.     Right Ear: External ear normal.     Left Ear: External ear normal.     Nose: Nose normal.  Eyes:     General:        Right eye: No discharge.        Left eye: No discharge.     Conjunctiva/sclera: Conjunctivae normal.     Pupils: Pupils are equal, round, and reactive to light.  Neck:     Thyroid: No thyromegaly.     Vascular: No JVD.  Trachea: No tracheal deviation.  Cardiovascular:     Rate and Rhythm: Normal rate and regular rhythm.     Heart sounds: Normal heart sounds.  Pulmonary:     Effort: No respiratory distress.     Breath sounds: No stridor. No wheezing or rhonchi.  Abdominal:     General: Bowel sounds are normal. There is no distension.     Palpations: Abdomen is soft. There is no mass.     Tenderness: There is abdominal tenderness. There is no guarding or rebound.     Hernia: No hernia is present.  Musculoskeletal:        General: No tenderness.     Cervical back: Normal range of motion and neck supple.  Lymphadenopathy:     Cervical: No cervical adenopathy.  Skin:    Findings: No erythema or rash.  Neurological:      Cranial Nerves: No cranial nerve deficit.     Motor: No abnormal muscle tone.     Coordination: Coordination normal.     Deep Tendon Reflexes: Reflexes normal.  Psychiatric:        Behavior: Behavior normal.        Thought Content: Thought content normal.        Judgment: Judgment normal.    RLQ painful to deep palp +/- rebound   Lab Results  Component Value Date   WBC 6.6 02/16/2020   HGB 13.3 02/16/2020   HCT 40.0 02/16/2020   PLT 163 02/16/2020   GLUCOSE 155 (H) 04/20/2020   CHOL 167 09/17/2017   TRIG 92.0 09/17/2017   HDL 53.00 09/17/2017   LDLCALC 96 09/17/2017   ALT 21 02/16/2020   AST 23 02/16/2020   NA 140 04/20/2020   K 4.0 04/20/2020   CL 105 04/20/2020   CREATININE 0.65 04/20/2020   BUN 16 04/20/2020   CO2 22 04/20/2020   TSH 1.24 02/16/2020   HGBA1C 6.7 11/04/2019    CT CORONARY MORPH W/CTA COR W/SCORE W/CA W/CM &/OR WO/CM  Addendum Date: 04/24/2020   ADDENDUM REPORT: 04/24/2020 13:00 CLINICAL DATA:  74 year old with chest pain EXAM: Cardiac/Coronary  CTA TECHNIQUE: The patient was scanned on a [employer]. FINDINGS: A 120 kV prospective scan was triggered in the descending thoracic aorta at 111 HU's. Axial non-contrast 3 mm slices were carried out through the heart. The data set was analyzed on a dedicated work station and scored using the [place]. Gantry rotation speed was 250 msecs and collimation was .6 mm. Beta blockade and 0.8 mg of sl NTG was given. The 3D data set was reconstructed in 5% intervals of the 67-82 % of the R-R cycle. Diastolic phases were analyzed on a dedicated work station using MPR, MIP and VRT modes. The patient received 80 cc of contrast. Aorta:  Normal size.  No calcifications.  No dissection. Aortic Valve:  Trileaflet.  No calcifications. Coronary Arteries:  Normal coronary origin.  Right dominance. RCA is a large dominant artery that gives rise to PDA and PLA. Mild RCA prox/mid calcified plaque, 0-24% stenosis. Left  main is a large artery that gives rise to LAD and LCX arteries. LAD is a large vessel that has no plaque. LCX is a non-dominant artery that gives rise to one large OM1 branch. There is no plaque. Other findings: Normal pulmonary vein drainage into the left atrium. Normal left atrial appendage without a thrombus. Normal size of the pulmonary artery. Please see radiology report for non cardiac findings. IMPRESSION: 1.  Coronary calcium score of 8. This was 71 percentile for age and sex matched control. 2. Normal coronary origin with right dominance. 3.  Mild RCA calcified plaque, 0-24% stenosis. 4. CAD-RADS 1. Minimal non-obstructive CAD (0-24%). Consider non-atherosclerotic causes of chest pain. Consider preventive therapy and risk factor modification. Candee Furbish, MD [facility] Electronically Signed   By: Candee Furbish MD   On: 04/24/2020 13:00   Result Date: 04/24/2020 EXAM: OVER-READ INTERPRETATION  CT CHEST The following report is an over-read performed by radiologist Dr. Vinnie Langton of [facility], [place] on 04/24/2020. This over-read does not include interpretation of cardiac or coronary anatomy or pathology. The coronary calcium score/coronary CTA interpretation by the cardiologist is attached. COMPARISON:  Chest CT 12/21/2009. FINDINGS: 6 x 4 mm (mean diameter 5 mm) right middle lobe pulmonary nodule (axial image 19 of series 12). Within the visualized portions of the thorax there are no other larger more suspicious appearing pulmonary nodules or masses, there is no acute consolidative airspace disease, no pleural effusions, no pneumothorax and no lymphadenopathy. Visualized portions of the upper abdomen are unremarkable. There are no aggressive appearing lytic or blastic lesions noted in the visualized portions of the skeleton. IMPRESSION: 1. 5 mm right middle lobe pulmonary nodule, nonspecific, but statistically likely benign. No follow-up needed if patient is low-risk. Non-contrast chest CT can be  considered in 12 months if patient is high-risk. This recommendation follows the consensus statement: Guidelines for Management of Incidental Pulmonary Nodules Detected on CT Images: From the Fleischner Society 2017; Radiology 2017; 284:228-243. Electronically Signed: By: Vinnie Langton M.D. On: 04/24/2020 12:29    Assessment & Plan:   There are no diagnoses linked to this encounter.   No orders of the defined types were placed in this encounter.    Follow-up: No follow-ups on file.  Walker Kehr, MD

## 2020-05-31 NOTE — Telephone Encounter (Signed)
Noted. Thanks.

## 2020-06-01 ENCOUNTER — Ambulatory Visit (HOSPITAL_COMMUNITY)
Admission: RE | Admit: 2020-06-01 | Discharge: 2020-06-01 | Disposition: A | Discharge status: Home / Self Care | Payer: Medicare Other | Source: Ambulatory Visit | Attending: Internal Medicine | Admitting: Internal Medicine

## 2020-06-01 ENCOUNTER — Encounter (HOSPITAL_COMMUNITY): Payer: Self-pay

## 2020-06-01 DIAGNOSIS — R1031 Right lower quadrant pain: Secondary | ICD-10-CM | POA: Insufficient documentation

## 2020-06-01 MED ORDER — IOHEXOL 300 MG/ML  SOLN
100.0000 mL | Freq: Once | INTRAMUSCULAR | Status: AC | PRN
  Administered 2020-06-01: 100 mL via INTRAVENOUS

## 2020-06-01 NOTE — Telephone Encounter (Signed)
Rec'd PA for pt HGydrocodone 7.5/325 mg. Completed PA on cover-my-meds w/ Key #  [id number]. Rec'd msg [facility] is reviewing your PA request. Typically an electronic response will be received within 72 hours...Johny Chess

## 2020-06-03 ENCOUNTER — Other Ambulatory Visit: Payer: Self-pay | Admitting: Internal Medicine

## 2020-06-03 DIAGNOSIS — K862 Cyst of pancreas: Secondary | ICD-10-CM

## 2020-06-04 ENCOUNTER — Encounter: Payer: Self-pay | Admitting: Internal Medicine

## 2020-06-04 ENCOUNTER — Telehealth: Payer: Self-pay | Admitting: Internal Medicine

## 2020-06-04 NOTE — Telephone Encounter (Signed)
Check status on PA received msg stating "  Request Reference Number: [id number]. HYDROCO/APAP TAB 7.5-325 is approved through 07/01/2020. " Faxed approval to pharmacy.Marland KitchenJohny Chess

## 2020-06-04 NOTE — Telephone Encounter (Signed)
    Please call with CT results, daughter Levada Dy calling

## 2020-06-04 NOTE — Assessment & Plan Note (Signed)
Rule out appendicitis versus other.  Stat labs.  Abdominal CT scan ASAP.  Go to ER if worse

## 2020-06-04 NOTE — Telephone Encounter (Signed)
Called pt gave her MD response from CT. See rep[ort.Marland KitchenJohny Chess

## 2020-06-22 ENCOUNTER — Other Ambulatory Visit: Payer: Self-pay

## 2020-06-22 ENCOUNTER — Ambulatory Visit
Admission: RE | Admit: 2020-06-22 | Discharge: 2020-06-22 | Disposition: A | Discharge status: Home / Self Care | Payer: Medicare Other | Source: Ambulatory Visit | Attending: Internal Medicine | Admitting: Internal Medicine

## 2020-06-22 DIAGNOSIS — K760 Fatty (change of) liver, not elsewhere classified: Secondary | ICD-10-CM | POA: Diagnosis not present

## 2020-06-22 DIAGNOSIS — K862 Cyst of pancreas: Secondary | ICD-10-CM

## 2020-06-22 DIAGNOSIS — D3501 Benign neoplasm of right adrenal gland: Secondary | ICD-10-CM | POA: Diagnosis not present

## 2020-06-22 DIAGNOSIS — D3502 Benign neoplasm of left adrenal gland: Secondary | ICD-10-CM | POA: Diagnosis not present

## 2020-06-22 MED ORDER — GADOBENATE DIMEGLUMINE 529 MG/ML IV SOLN
18.0000 mL | Freq: Once | INTRAVENOUS | Status: AC | PRN
  Administered 2020-06-22: 18 mL via INTRAVENOUS

## 2020-06-25 ENCOUNTER — Telehealth: Payer: Self-pay | Admitting: Internal Medicine

## 2020-06-25 NOTE — Telephone Encounter (Signed)
Per chart results are in.. copy and paste impression. Forwarding to MD../lmb  IMPRESSION: 1. 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 Multidisciplinary GI oncology consultation with endoscopic ultrasound-FNA suggested. 2. No evidence of metastatic disease in the abdomen. 3. Mild diffuse hepatic steatosis. 4. Bilateral adrenal adenomas.

## 2020-06-25 NOTE — Telephone Encounter (Signed)
  Please call [place] for MR result  Call [phone]

## 2020-06-26 ENCOUNTER — Encounter: Payer: Self-pay | Admitting: Internal Medicine

## 2020-06-26 ENCOUNTER — Other Ambulatory Visit: Payer: Self-pay

## 2020-06-26 ENCOUNTER — Other Ambulatory Visit: Payer: Self-pay | Admitting: Internal Medicine

## 2020-06-26 ENCOUNTER — Telehealth (INDEPENDENT_AMBULATORY_CARE_PROVIDER_SITE_OTHER): Payer: Medicare Other | Admitting: Internal Medicine

## 2020-06-26 DIAGNOSIS — K8689 Other specified diseases of pancreas: Secondary | ICD-10-CM

## 2020-06-26 NOTE — Telephone Encounter (Signed)
Called pt she does not have capability to do a virtual. Made tele call for tomorrow @ 1.Marland KitchenJohny Arnold

## 2020-06-26 NOTE — Progress Notes (Signed)
Virtual Visit via Telephone Note  I connected with Amanda Arnold on 06/26/20 at 10:40 AM EST by telephone and verified that I am speaking with the correct person using two identifiers.  Location: Patient: home Provider: [facility]   I discussed the limitations, risks, security and privacy concerns of performing an evaluation and management service by telephone and the availability of in person appointments. I also discussed with the patient that there may be a patient responsible charge related to this service. The patient expressed understanding and agreed to proceed.   History of Present Illness:   I called Amanda Arnold to discuss her recent MRI report.  We talked about potential etiologies of her pancreatic abnormality and the need to get a biopsy of the pancreatic mass as our first step in making a diagnosis. Amanda Arnold does not have any new pain in the back.  No nausea, vomiting or jaundice. Her low back pain is chronic and her abdominal symptoms are chronic. Observations/Objective:  ABD MRI IMPRESSION: 1. 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 Multidisciplinary GI oncology consultation with endoscopic ultrasound-FNA suggested. 2. No evidence of metastatic disease in the abdomen. 3. Mild diffuse hepatic steatosis. 4. Bilateral adrenal adenomas.  Assessment and Plan:  Please see plan Follow Up Instructions:    I discussed the assessment and treatment plan with the patient. The patient was provided an opportunity to ask questions and all were answered. The patient agreed with the plan and demonstrated an understanding of the instructions.   The patient was advised to call back or seek an in-person evaluation if the symptoms worsen or if the condition fails to  improve as anticipated.  I provided 22 minutes of non-face-to-face time during this encounter.   Walker Kehr, MD

## 2020-06-26 NOTE — Telephone Encounter (Signed)
I called. No answer. [name], can we do a virtual or tel call visit today or tomorrow w/her? Thx

## 2020-06-26 NOTE — Assessment & Plan Note (Signed)
ABD MRI IMPRESSION: 1. 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 Multidisciplinary GI oncology consultation with endoscopic ultrasound-FNA suggested. 2. No evidence of metastatic disease in the abdomen. 3. Mild diffuse hepatic steatosis. 4. Bilateral adrenal adenomas.  We discussed the results.  GI referral was made.

## 2020-06-26 NOTE — Telephone Encounter (Signed)
Spoke w/Dr. Camila Li about his schedule.. he is wanting to do tele vist this am. Notified front supervisor to add pt on for telephone visit.Marland KitchenJohny Arnold

## 2020-07-03 ENCOUNTER — Encounter: Payer: Self-pay | Admitting: Gastroenterology

## 2020-07-03 ENCOUNTER — Telehealth: Payer: Self-pay | Admitting: Internal Medicine

## 2020-07-03 NOTE — Telephone Encounter (Signed)
Patient cannot get in with GI until 01.11.22 and they are wondering what she needs to do because she is still having side pain and they do not want to wait that long in pain.  [phone]

## 2020-07-03 NOTE — Telephone Encounter (Signed)
Notified pt/daughter w/MD response../lmb 

## 2020-07-03 NOTE — Telephone Encounter (Signed)
Amanda Arnold can use your Norco for pain as before.  They can call GI department and asked to move the appointment up. Thanks

## 2020-07-10 ENCOUNTER — Ambulatory Visit (INDEPENDENT_AMBULATORY_CARE_PROVIDER_SITE_OTHER): Payer: Medicare Other | Admitting: Internal Medicine

## 2020-07-10 ENCOUNTER — Ambulatory Visit (INDEPENDENT_AMBULATORY_CARE_PROVIDER_SITE_OTHER): Payer: Medicare Other

## 2020-07-10 ENCOUNTER — Other Ambulatory Visit: Payer: Self-pay

## 2020-07-10 ENCOUNTER — Encounter: Payer: Self-pay | Admitting: Internal Medicine

## 2020-07-10 DIAGNOSIS — K8689 Other specified diseases of pancreas: Secondary | ICD-10-CM | POA: Diagnosis not present

## 2020-07-10 DIAGNOSIS — M25511 Pain in right shoulder: Secondary | ICD-10-CM

## 2020-07-10 MED ORDER — METHYLPREDNISOLONE 4 MG PO TBPK: ORAL_TABLET | ORAL | 0 refills | Status: DC

## 2020-07-10 MED ORDER — LIDOCAINE HCL (PF) 1 % IJ SOLN
3.0000 mL | Freq: Once | INTRAMUSCULAR | Status: AC
  Administered 2020-07-10: 3 mL via INTRADERMAL

## 2020-07-10 MED ORDER — METHYLPREDNISOLONE ACETATE 40 MG/ML IJ SUSP
40.0000 mg | Freq: Once | INTRAMUSCULAR | Status: AC
  Administered 2020-07-10: 40 mg via INTRAMUSCULAR

## 2020-07-10 NOTE — Progress Notes (Signed)
Subjective:  Patient ID: Amanda Arnold, female    DOB: [birth year]  Age: 74 y.o. MRN: [id number]  CC: Shoulder Pain   HPI Amanda Arnold presents for severe R shoulder pain x 2 monthsand R arm pain down to the wrist Follow-up on my pancreatic normality R shoulder X ray nl in 2019  Outpatient Medications Prior to Visit  Medication Sig Dispense Refill  . celecoxib (CELEBREX) 200 MG capsule Take 1 capsule (200 mg total) by mouth daily as needed for moderate pain. 30 capsule 3  . Cholecalciferol (VITAMIN D3) 2000 UNITS capsule Take 1 capsule (2,000 Units total) by mouth daily. 100 capsule 3  . gabapentin (NEURONTIN) 300 MG capsule Take 1 capsule (300 mg total) by mouth 3 (three) times daily. 90 capsule 5  . HYDROcodone-acetaminophen (NORCO) 7.5-325 MG tablet Take 1 tablet by mouth every 8 (eight) hours as needed for severe pain. 90 tablet 0  . ibuprofen (ADVIL) 200 MG tablet Take 600 mg by mouth every 6 (six) hours as needed for moderate pain.    . metoprolol succinate (TOPROL-XL) 25 MG 24 hr tablet TAKE 1/2 TABLET(12.5 MG) BY MOUTH IN AM AND 1/2 PM 90 tablet 1  . metoprolol tartrate (LOPRESSOR) 100 MG tablet TAKE 1 TABLET BY MOUTH 2 HOURS BEFORE YOUR CT SCAN 1 tablet 0  . rosuvastatin (CRESTOR) 10 MG tablet Take 1 tablet (10 mg total) by mouth daily. 90 tablet 3   No facility-administered medications prior to visit.    ROS: Review of Systems  Constitutional: Negative for activity change, appetite change, chills, fatigue and unexpected weight change.  HENT: Negative for congestion, mouth sores and sinus pressure.   Eyes: Negative for visual disturbance.  Respiratory: Negative for cough and chest tightness.   Gastrointestinal: Negative for abdominal distention, abdominal pain and nausea.  Genitourinary: Negative for difficulty urinating, frequency and vaginal pain.  Musculoskeletal: Positive for arthralgias and back pain. Negative for gait problem.  Skin: Negative for pallor and  rash.  Neurological: Negative for dizziness, tremors, weakness, numbness and headaches.  Psychiatric/Behavioral: Negative for confusion and sleep disturbance.    Objective:  BP 118/70   Pulse 65   Temp 98.5 F (36.9 C) (Oral)   Ht 5\' 2"  (1.575 m)   Wt 194 lb (88 kg)   SpO2 96%   BMI 35.48 kg/m   BP Readings from Last 3 Encounters:  07/10/20 118/70  05/31/20 122/70  05/28/20 138/80    Wt Readings from Last 3 Encounters:  07/10/20 194 lb (88 kg)  05/31/20 193 lb 6.4 oz (87.7 kg)  05/28/20 197 lb 12.8 oz (89.7 kg)    Physical Exam Constitutional:      General: She is not in acute distress.    Appearance: She is well-developed.  HENT:     Head: Normocephalic.     Right Ear: External ear normal.     Left Ear: External ear normal.     Nose: Nose normal.     Mouth/Throat:     Mouth: Oropharynx is clear and moist.  Eyes:     General:        Right eye: No discharge.        Left eye: No discharge.     Conjunctiva/sclera: Conjunctivae normal.     Pupils: Pupils are equal, round, and reactive to light.  Neck:     Thyroid: No thyromegaly.     Vascular: No JVD.     Trachea: No tracheal deviation.  Cardiovascular:  Rate and Rhythm: Normal rate and regular rhythm.     Heart sounds: Normal heart sounds.  Pulmonary:     Effort: No respiratory distress.     Breath sounds: No stridor. No wheezing.  Abdominal:     General: Bowel sounds are normal. There is no distension.     Palpations: Abdomen is soft. There is no mass.     Tenderness: There is no abdominal tenderness. There is no guarding or rebound.  Musculoskeletal:        General: Tenderness present. No edema.     Cervical back: Normal range of motion and neck supple.  Lymphadenopathy:     Cervical: No cervical adenopathy.  Skin:    Findings: No erythema or rash.  Neurological:     Cranial Nerves: No cranial nerve deficit.     Motor: No abnormal muscle tone.     Coordination: Coordination normal.     Deep  Tendon Reflexes: Reflexes normal.  Psychiatric:        Mood and Affect: Mood and affect normal.        Behavior: Behavior normal.        Thought Content: Thought content normal.        Judgment: Judgment normal.    R shoulder is very tender w/ROM   Procedure :Joint Injection,  R shoulder   Indication:  Subacromial bursitis with refractory  chronic pain.   Risks including unsuccessful procedure , bleeding, infection, bruising, skin atrophy, "steroid flare-up" and others were explained to the patient in detail as well as the benefits. Informed consent was obtained and signed.   Tthe patient was placed in a comfortable position. Lateral approach was used. Skin was prepped with Betadine and alcohol  and anesthetized with a cooling spray. Then, a 5 cc syringe with a 2 inch long 24-gauge needle was used for a joint injection.. The needle was advanced  Into the subacromial space.The bursa was injected with 3 mL of 2% lidocaine and 40 mg of Depo-Medrol .  Band-Aid was applied.   Tolerated well. Complications: None. Good pain relief following the procedure.     Lab Results  Component Value Date   WBC 6.7 05/31/2020   HGB 13.3 05/31/2020   HCT 39.4 05/31/2020   PLT 165.0 05/31/2020   GLUCOSE 108 (H) 05/31/2020   CHOL 167 09/17/2017   TRIG 92.0 09/17/2017   HDL 53.00 09/17/2017   LDLCALC 96 09/17/2017   ALT 15 05/31/2020   AST 23 05/31/2020   NA 139 05/31/2020   K 4.0 05/31/2020   CL 105 05/31/2020   CREATININE 0.69 05/31/2020   BUN 18 05/31/2020   CO2 28 05/31/2020   TSH 1.24 02/16/2020   HGBA1C 6.7 11/04/2019    MR ABDOMEN MRCP W WO CONTAST  Result Date: 06/24/2020 CLINICAL DATA:  Indeterminate pancreatic tail mass on recent CT. EXAM: MRI ABDOMEN WITHOUT AND WITH CONTRAST (INCLUDING MRCP) TECHNIQUE: Multiplanar multisequence MR imaging of the abdomen was performed both before and after the administration of intravenous contrast. Heavily T2-weighted images of the biliary and  pancreatic ducts were obtained, and three-dimensional MRCP images were rendered by post processing. CONTRAST:  38mL MULTIHANCE GADOBENATE DIMEGLUMINE 529 MG/ML IV SOLN COMPARISON:  06/01/2020 CT abdomen/pelvis. FINDINGS: Lower chest: No acute abnormality at the lung bases. Hepatobiliary: Normal liver size and configuration. Mild diffuse hepatic steatosis. No liver mass. Cholecystectomy. No biliary ductal dilatation. Common bile duct diameter 4 mm. No choledocholithiasis. No biliary masses, strictures or beading. Pancreas: There is a  3.8 x 3.0 x 3.1 cm solid T2 iso-to-hypointense pancreatic tail mass with heterogeneous hypoenhancement (series 4/image 21). Tubular cystic focus at the anterior tip of the pancreatic tail is favored to represent focal dilatation of the terminus of the main pancreatic duct up to 8 mm diameter (series 3/image 17). No pancreas divisum. Spleen: Normal size. No mass. Adrenals/Urinary Tract: Bilateral adrenal adenomas demonstrate prominent loss of signal intensity on out of phase chemical shift imaging, measuring 1.6 cm on the right and 1.8 cm on the left. No hydronephrosis. Scattered small simple renal cortical cysts in both kidneys, largest 1.0 cm in the upper left kidney. No suspicious renal masses. Stomach/Bowel: Normal non-distended stomach. Visualized small and large bowel is normal caliber, with no bowel wall thickening. Vascular/Lymphatic: Normal caliber abdominal aorta. The splenic vein appears focally occluded by the pancreatic tail mass (series 22/image 47) with mildly dilated venous collaterals at the splenic hilum. Patent portal, hepatic and renal veins. No pathologically enlarged lymph nodes in the abdomen. Other: No abdominal ascites or focal fluid collection. Musculoskeletal: No aggressive appearing focal osseous lesions. IMPRESSION: 1. Solid 3.8 x 3.0 x 3.1 cm pancreatic tail mass with heterogeneous hypoenhancement, suspicious for primary pancreatic malignancy such as  adenocarcinoma. 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 Multidisciplinary GI oncology consultation with endoscopic ultrasound-FNA suggested. 2. No evidence of metastatic disease in the abdomen. 3. Mild diffuse hepatic steatosis. 4. Bilateral adrenal adenomas. These results will be called to the ordering clinician or representative by the Radiologist Assistant, and communication documented in the PACS or [employer]. Electronically Signed   By: Ilona Sorrel M.D.   On: 06/24/2020 14:14    Assessment & Plan:    Walker Kehr, MD

## 2020-07-10 NOTE — Assessment & Plan Note (Addendum)
New. ?rotator cuff tear vs other Therapeutic and diagnostic options discussed. Will inject with steroids at [facility] request Medrol pack Ice

## 2020-07-10 NOTE — Patient Instructions (Addendum)
Shoulder Impingement Syndrome  Shoulder impingement syndrome is a condition that causes pain when connective tissues (tendons) surrounding the shoulder joint become pinched. These tendons are part of the group of muscles and tissues that help to stabilize the shoulder (rotator cuff). Beneath the rotator cuff is a fluid-filled sac (bursa) that allows the muscles and tendons to glide smoothly. The bursa may become swollen or irritated (bursitis). Bursitis, swelling in the rotator cuff tendons, or both conditions can decrease how much space is under a bone in the shoulder joint (acromion), resulting in impingement. What are the causes? Shoulder impingement syndrome may be caused by bursitis or swelling of the rotator cuff tendons, which may result from:  Repetitive overhead arm movements.  Falling onto the shoulder.  Weakness in the shoulder muscles. What increases the risk? You may be more likely to develop this condition if you:  Play sports that involve throwing, such as baseball.  Participate in sports such as tennis, volleyball, and swimming.  Work as a painter, carpenter, or fruit picker. Some people are also more likely to develop impingement syndrome because of the shape of their acromion bone. What are the signs or symptoms? The main symptom of this condition is pain on the front or side of the shoulder. The pain may:  Get worse when lifting or raising the arm.  Get worse at night.  Wake you up from sleeping.  Feel sharp when the shoulder is moved and then fade to an ache. Other symptoms may include:  Tenderness.  Stiffness.  Inability to raise the arm above shoulder level or behind the body.  Weakness. How is this diagnosed? This condition may be diagnosed based on:  Your symptoms and medical history.  A physical exam.  Imaging tests, such as: ? X-rays. ? MRI. ? Ultrasound. How is this treated? This condition may be treated by:  Resting your shoulder and  avoiding all activities that cause pain or put stress on the shoulder.  Icing your shoulder.  NSAIDs to help reduce pain and swelling.  One or more injections of medicines to numb the area and reduce inflammation.  Physical therapy.  Surgery. This may be needed if nonsurgical treatments have not helped. Surgery may involve repairing the rotator cuff, reshaping the acromion, or removing the bursa. Follow these instructions at home: Managing pain, stiffness, and swelling   If directed, put ice on the injured area. ? Put ice in a plastic bag. ? Place a towel between your skin and the bag. ? Leave the ice on for 20 minutes, 2-3 times a day. Activity  Rest and return to your normal activities as told by your health care provider. Ask your health care provider what activities are safe for you.  Do exercises as told by your health care provider. General instructions  Do not use any products that contain nicotine or tobacco, such as cigarettes, e-cigarettes, and chewing tobacco. These can delay healing. If you need help quitting, ask your health care provider.  Ask your health care provider when it is safe for you to drive.  Take over-the-counter and prescription medicines only as told by your health care provider.  Keep all follow-up visits as told by your health care provider. This is important. How is this prevented?  Give your body time to rest between periods of activity.  Be safe and responsible while being active. This will help you avoid falls.  Maintain physical fitness, including strength and flexibility. Contact a health care provider if:  Your   symptoms have not improved after 1-2 months of treatment and rest.  You cannot lift your arm away from your body. Summary  Shoulder impingement syndrome is a condition that causes pain when connective tissues (tendons) surrounding the shoulder joint become pinched.  The main symptom of this condition is pain on the front or  side of the shoulder.  This condition is usually treated with rest, ice, and pain medicines as needed. This information is not intended to replace advice given to you by your health care provider. Make sure you discuss any questions you have with your health care provider. Document Revised: 10/22/2018 Document Reviewed: 12/23/2017 Elsevier Patient Education  2020 [employer].    Postprocedure instructions :    A Band-Aid should be left on for 12 hours. Injection therapy is not a cure itself. It is used in conjunction with other modalities. You can use nonsteroidal anti-inflammatories like ibuprofen , hot and cold compresses. Rest is recommended in the next 24 hours. You need to report immediately  if fever, chills or any signs of infection develop.

## 2020-07-10 NOTE — Assessment & Plan Note (Addendum)
New problem.  Test results that are available to [place] so far were discussed w/pt and her daughter.  Gastroenterology appointment is pending

## 2020-07-24 ENCOUNTER — Telehealth: Payer: Self-pay

## 2020-07-24 ENCOUNTER — Other Ambulatory Visit: Payer: Self-pay

## 2020-07-24 ENCOUNTER — Encounter: Payer: Self-pay | Admitting: Gastroenterology

## 2020-07-24 ENCOUNTER — Ambulatory Visit: Payer: Medicare Other | Admitting: Gastroenterology

## 2020-07-24 VITALS — BP 110/74 | HR 83 | Ht 62.0 in | Wt 184.8 lb

## 2020-07-24 DIAGNOSIS — M545 Low back pain, unspecified: Secondary | ICD-10-CM | POA: Diagnosis not present

## 2020-07-24 DIAGNOSIS — K8689 Other specified diseases of pancreas: Secondary | ICD-10-CM

## 2020-07-24 NOTE — Patient Instructions (Addendum)
If you are age 73 or older, your body mass index should be between 23-30. Your Body mass index is 33.8 kg/m. If this is out of the aforementioned range listed, please consider follow up with your Primary Care Provider.  If you are age 22 or younger, your body mass index should be between 19-25. Your Body mass index is 33.8 kg/m. If this is out of the aformentioned range listed, please consider follow up with your Primary Care Provider.   Contact your Primary care physician to discuss your back pain. In the mean time try using Tylenol, Salon-Pas patches, a heating pad, etc...  I will be discussing your visit with Dr. Ardis Hughs and Dr. Rush Landmark. We will contact you about scheduling you for an EUS.  Follow up pending at this time.  Thank you for entrusting me with your care and choosing [facility].  Alonza Bogus, PA-C

## 2020-07-24 NOTE — Telephone Encounter (Signed)
EUS has been scheduled for 08/02/20 with Dr Ardis Hughs at [place] test on 07/30/20 at 33 am  EUS scheduled, pt instructed and medications reviewed.  Patient instructions mailed to home.  Patient to call with any questions or concerns.

## 2020-07-24 NOTE — Progress Notes (Signed)
07/24/2020 Amanda Arnold [id number] Mar 03, 1946   HISTORY OF PRESENT ILLNESS: This is a 75 year old female who is a patient of Dr. Blanch Media.  She has been referred here on this occasion by her PCP, Dr. Alain Marion, for evaluation regarding a pancreatic mass.  She had a CT scan of the abdomen and pelvis performed for evaluation of what has been called right lower quadrant abdominal pain.  That showed an indeterminate 2.9 x 3 cm mass of the pancreatic tail for which they recommended MRI for further evaluation.  MRI was performed and showed a solid 3.8 x 3.0 x 3.1 cm pancreatic tail mass with heterogeneous hypoenhancement suspicious for primary pancreatic malignancy such as adenocarcinoma.  She also has focal occlusion of the splenic vein by the pancreatic tail mass with dilated venous collaterals at the splenic hilum.  In regards to her right lower quadrant abdominal pain she describes right-sided back pain that goes into her right groin and down her right leg.  Neither CT scan or MRI showed anything in her abdomen to account for her pain.  She tells me that the pain is worse with movement.  Feels better when she puts a heating pad on it, but then when she gets up and starts walking around it begins hurting again.   Past Medical History:  Diagnosis Date  . Abdominal pain, epigastric 12/18/2009  . ANXIETY 05/25/2006  . BRONCHITIS, ACUTE 12/08/2007  . COPD 05/25/2006  . DEPRESSION 01/02/2006  . Diabetes mellitus without complication ([place])   . ESOTROPIA, LEFT EYE 1946/02/27  . FIBROMYALGIA 04/02/2006  . Headache(784.0) 12/08/2007  . HYPERGLYCEMIA 05/15/2010  . HYPERLIPIDEMIA 04/30/2006  . HYPERTENSION 05/25/2006  . Hypertension   . INSOMNIA, PERSISTENT 02/22/2008  . Lazy eye    left  . LOW BACK PAIN 01/27/2007  . Migraines   . 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 OBSTRUCTIVE SLEEP APNEA 04/17/2010   does not use CPAP  . OSTEOARTHRITIS 05/25/2006  . Polyuria   . Pre-diabetes   . Psychosomatic  disease 2011  . 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 T M J 05/11/2009   Past Surgical History:  Procedure Laterality Date  . CHOLECYSTECTOMY    . MASS EXCISION Left 10/06/2016   Procedure: EXCISION LEFT BUTTOCKS MASS;  Surgeon: Alphonsa Overall, MD;  Location: [place];  Service: General;  Laterality: Left;    reports that she has been smoking cigarettes. Her smokeless tobacco use includes snuff. She reports that she does not drink alcohol and does not use drugs. family history includes Heart attack in her father; Heart disease in her mother; Hypertension in her brother. Allergies  Allergen Reactions  . Morphine And Related Shortness Of Breath  . Bupropion Hcl Other (See Comments)    "does not feel right"  . Ezetimibe Other (See Comments)    REACTION: legs burning  . Metformin And Related Diarrhea    Diarrhea w/XR or regular   . Naproxen     Upset stomach, diarrhea  . Trazodone Hcl Swelling  . Tramadol Hcl Palpitations    REACTION: palpitations      Outpatient Encounter Medications as of 07/24/2020  Medication Sig  . celecoxib (CELEBREX) 200 MG capsule Take 1 capsule (200 mg total) by mouth daily as needed for moderate pain.  . Cholecalciferol (VITAMIN D3) 2000 UNITS capsule Take 1 capsule (2,000 Units total) by mouth daily.  Marland Kitchen gabapentin (NEURONTIN) 300 MG capsule Take 1 capsule (300 mg total) by mouth 3 (three) times daily.  Marland Kitchen  HYDROcodone-acetaminophen (NORCO) 7.5-325 MG tablet Take 1 tablet by mouth every 8 (eight) hours as needed for severe pain.  Marland Kitchen ibuprofen (ADVIL) 200 MG tablet Take 600 mg by mouth every 6 (six) hours as needed for moderate pain.  . metoprolol succinate (TOPROL-XL) 25 MG 24 hr tablet TAKE 1/2 TABLET(12.5 MG) BY MOUTH IN AM AND 1/2 PM  . rosuvastatin (CRESTOR) 10 MG tablet Take 1 tablet (10 mg total) by mouth daily.  . [DISCONTINUED] methylPREDNISolone (MEDROL DOSEPAK) 4 MG TBPK tablet As directed (Patient not taking: Reported on 07/24/2020)  .  [DISCONTINUED] metoprolol tartrate (LOPRESSOR) 100 MG tablet TAKE 1 TABLET BY MOUTH 2 HOURS BEFORE YOUR CT SCAN (Patient not taking: Reported on 07/24/2020)   No facility-administered encounter medications on file as of 07/24/2020.     REVIEW OF SYSTEMS  : All other systems reviewed and negative except where noted in the History of Present Illness.   PHYSICAL EXAM: BP 110/74 (BP Location: Left Arm, Patient Position: Sitting)   Pulse 83   Ht 5\' 2"  (1.575 m)   Wt 184 lb 12.8 oz (83.8 kg)   SpO2 94%   BMI 33.80 kg/m  General: Well developed white female in no acute distress Head: Normocephalic and atraumatic Eyes: Sclerae anicteric, conjunctiva pink. Ears: Normal auditory acuity Lungs: Clear throughout to auscultation; no W/R/R. Heart: Regular rate and rhythm; no M/R/G. Abdomen: Soft, non-distended.  BS present.  Non-tender Musculoskeletal: Symmetrical with no gross deformities  Skin: No lesions on visible extremities Extremities: No edema  Neurological: Alert oriented x 4, grossly non-focal Psychological:  Alert and cooperative. Normal mood and affect  ASSESSMENT AND PLAN: *Pancreatic mass in the tail of the pancreas suspicious for primary pancreatic malignancy seen on both CT scan and MRI, recommending EUS.  I have sent a message to both Dr. Ardis Hughs and Dr. Rush Landmark for them to review and we will plan to schedule that once we hear back from them. *Right lower quadrant abdominal pain: This is not abdominal pain.  She has no abdominal tenderness on exam.  Pain is worse with movement.  This sounds like more of a musculoskeletal source possibly with a pinched nerve causing radiation of pain down her leg and somewhat into her right groin.  We discussed using heating pad, Salonpas patches or lidocaine patches, Tylenol, and further discussion with her PCP.  CT scan and MRI have shown no intra-abdominal source either.   CC:  Plotnikov, Evie Lacks, MD

## 2020-07-24 NOTE — Telephone Encounter (Signed)
-----   Message from Milus Banister, MD sent at 07/24/2020  2:23 PM EST ----- I reviewed 05/2020 Ct and 06/2020 MR images and report.  Agree next best step is EUS evaluation with FNA.   Fillmore Bynum, please offer her 1/20 (next Thursday) appt at the end of my current patients, for pancreatic mass unless Dr. Rush Landmark has sooner availability.  Thanks   ----- Message ----- From: Loralie Champagne, PA-C Sent: 07/24/2020   1:21 PM EST To: Milus Banister, MD, Irene Shipper, MD, #

## 2020-07-24 NOTE — Progress Notes (Signed)
I spoke with the pt and this week does not give her enough time to prepare.  She prefers to have 1/20.  She has been scheduled and instructed.

## 2020-07-24 NOTE — Progress Notes (Signed)
I agree with EUS as the next step.  Thank you for forwarding this case to our EUS colleagues.

## 2020-07-25 ENCOUNTER — Emergency Department (HOSPITAL_COMMUNITY)
Admission: EM | Admit: 2020-07-25 | Discharge: 2020-07-26 | Disposition: A | Discharge status: Home / Self Care | Payer: Medicare Other | Attending: Emergency Medicine | Admitting: Emergency Medicine

## 2020-07-25 ENCOUNTER — Encounter (HOSPITAL_COMMUNITY): Payer: Self-pay | Admitting: Emergency Medicine

## 2020-07-25 ENCOUNTER — Other Ambulatory Visit: Payer: Self-pay

## 2020-07-25 DIAGNOSIS — R110 Nausea: Secondary | ICD-10-CM | POA: Insufficient documentation

## 2020-07-25 DIAGNOSIS — E119 Type 2 diabetes mellitus without complications: Secondary | ICD-10-CM | POA: Insufficient documentation

## 2020-07-25 DIAGNOSIS — R197 Diarrhea, unspecified: Secondary | ICD-10-CM | POA: Diagnosis not present

## 2020-07-25 DIAGNOSIS — I10 Essential (primary) hypertension: Secondary | ICD-10-CM | POA: Diagnosis not present

## 2020-07-25 DIAGNOSIS — R0602 Shortness of breath: Secondary | ICD-10-CM | POA: Diagnosis not present

## 2020-07-25 DIAGNOSIS — R059 Cough, unspecified: Secondary | ICD-10-CM | POA: Diagnosis not present

## 2020-07-25 DIAGNOSIS — U071 COVID-19: Secondary | ICD-10-CM | POA: Insufficient documentation

## 2020-07-25 DIAGNOSIS — F17210 Nicotine dependence, cigarettes, uncomplicated: Secondary | ICD-10-CM | POA: Insufficient documentation

## 2020-07-25 DIAGNOSIS — J441 Chronic obstructive pulmonary disease with (acute) exacerbation: Secondary | ICD-10-CM | POA: Insufficient documentation

## 2020-07-25 DIAGNOSIS — R5383 Other fatigue: Secondary | ICD-10-CM | POA: Diagnosis not present

## 2020-07-25 DIAGNOSIS — Z79899 Other long term (current) drug therapy: Secondary | ICD-10-CM | POA: Insufficient documentation

## 2020-07-25 LAB — COMPREHENSIVE METABOLIC PANEL
ALT: 27 U/L (ref 0–44)
AST: 37 U/L (ref 15–41)
Albumin: 3.5 g/dL (ref 3.5–5.0)
Alkaline Phosphatase: 55 U/L (ref 38–126)
Anion gap: 12 (ref 5–15)
BUN: 20 mg/dL (ref 8–23)
CO2: 22 mmol/L (ref 22–32)
Calcium: 8.6 mg/dL — ABNORMAL LOW (ref 8.9–10.3)
Chloride: 102 mmol/L (ref 98–111)
Creatinine, Ser: 0.79 mg/dL (ref 0.44–1.00)
GFR, Estimated: >60 mL/min (ref >60)
Glucose, Bld: 114 mg/dL — ABNORMAL HIGH (ref 70–99)
Potassium: 3.8 mmol/L (ref 3.5–5.1)
Sodium: 136 mmol/L (ref 135–145)
Total Bilirubin: 1.2 mg/dL (ref 0.3–1.2)
Total Protein: 6.4 g/dL — ABNORMAL LOW (ref 6.5–8.1)

## 2020-07-25 LAB — URINALYSIS, ROUTINE W REFLEX MICROSCOPIC
Glucose, UA: NEGATIVE mg/dL
Ketones, ur: 40 mg/dL — AB
Nitrite: NEGATIVE
Protein, ur: 30 mg/dL — AB
Specific Gravity, Urine: >1.03 — ABNORMAL HIGH (ref 1.005–1.030)
pH: 6 (ref 5.0–8.0)

## 2020-07-25 LAB — URINALYSIS, MICROSCOPIC (REFLEX)

## 2020-07-25 LAB — CBC
HCT: 41.3 % (ref 36.0–46.0)
Hemoglobin: 13.9 g/dL (ref 12.0–15.0)
MCH: 31 pg (ref 26.0–34.0)
MCHC: 33.7 g/dL (ref 30.0–36.0)
MCV: 92 fL (ref 80.0–100.0)
Platelets: 111 10*3/uL — ABNORMAL LOW (ref 150–400)
RBC: 4.49 MIL/uL (ref 3.87–5.11)
RDW: 12.9 % (ref 11.5–15.5)
WBC: 3.9 10*3/uL — ABNORMAL LOW (ref 4.0–10.5)
nRBC: 0 % (ref 0.0–0.2)

## 2020-07-25 LAB — SARS CORONAVIRUS 2 (TAT 6-24 HRS): SARS Coronavirus 2: POSITIVE — AB

## 2020-07-25 LAB — LIPASE, BLOOD: Lipase: 36 U/L (ref 11–51)

## 2020-07-25 NOTE — ED Triage Notes (Signed)
Pt arrives to ED with chief complaint of 1 week of cold lk symptoms with nausea diarrhea and chills. She also reports her food has a awful taste.

## 2020-07-26 ENCOUNTER — Telehealth: Payer: Self-pay

## 2020-07-26 ENCOUNTER — Emergency Department (HOSPITAL_COMMUNITY): Payer: Medicare Other

## 2020-07-26 ENCOUNTER — Telehealth: Payer: Self-pay | Admitting: Gastroenterology

## 2020-07-26 DIAGNOSIS — R059 Cough, unspecified: Secondary | ICD-10-CM | POA: Diagnosis not present

## 2020-07-26 MED ORDER — SODIUM CHLORIDE 0.9 % IV SOLN: 100.0000 mg | Freq: Every day | INTRAVENOUS | Status: DC

## 2020-07-26 MED ORDER — LACTATED RINGERS IV BOLUS
500.0000 mL | Freq: Once | INTRAVENOUS | Status: AC
  Administered 2020-07-26: 500 mL via INTRAVENOUS

## 2020-07-26 MED ORDER — ONDANSETRON HCL 4 MG/2ML IJ SOLN
4.0000 mg | Freq: Once | INTRAMUSCULAR | Status: AC
  Administered 2020-07-26: 4 mg via INTRAVENOUS
  Filled 2020-07-26: qty 2

## 2020-07-26 MED ORDER — SODIUM CHLORIDE 0.9 % IV SOLN
200.0000 mg | Freq: Once | INTRAVENOUS | Status: DC
  Filled 2020-07-26: qty 40

## 2020-07-26 NOTE — Telephone Encounter (Signed)
I spoke with the pt and she has been advised and verbalized understanding of the change in MD and location as well as the date.  I will mail new instructions

## 2020-07-26 NOTE — ED Provider Notes (Signed)
[facility] EMERGENCY DEPARTMENT Provider Note   CSN: [id number] Arrival date & time: 07/25/20  1607     History Chief Complaint  Patient presents with  . Nausea    Diarrhea Cold     Amanda Arnold is a 75 y.o. female.  The history is provided by the patient.  Cough Severity:  Moderate Onset quality:  Gradual Timing:  Intermittent Progression:  Worsening Chronicity:  New Relieved by:  Nothing Worsened by:  Nothing Associated symptoms: shortness of breath   Patient reports for over a week she has had cold-like symptoms.  She reports nausea and intermittent diarrhea that is improving.  She also reports cough and congestion.  She also reports lack of taste when eating she feels dehydrated.  No chest pain.  She has had some shortness of breath     Past Medical History:  Diagnosis Date  . Abdominal pain, epigastric 12/18/2009  . ANXIETY 05/25/2006  . BRONCHITIS, ACUTE 12/08/2007  . COPD 05/25/2006  . DEPRESSION 01/02/2006  . Diabetes mellitus without complication ([place])   . ESOTROPIA, LEFT EYE [birth year]  . FIBROMYALGIA 04/02/2006  . Headache(784.0) 12/08/2007  . HYPERGLYCEMIA 05/15/2010  . HYPERLIPIDEMIA 04/30/2006  . HYPERTENSION 05/25/2006  . Hypertension   . INSOMNIA, PERSISTENT 02/22/2008  . Lazy eye    left  . LOW BACK PAIN 01/27/2007  . Migraines   . 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 OBSTRUCTIVE SLEEP APNEA 04/17/2010   does not use CPAP  . OSTEOARTHRITIS 05/25/2006  . Polyuria   . Pre-diabetes   . Psychosomatic disease 2011  . 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 T M J 05/11/2009    Patient Active Problem List   Diagnosis Date Noted  . Shoulder pain, right 07/10/2020  . Pancreatic mass 06/26/2020  . Coronary atherosclerosis 05/28/2020  . Pulmonary nodule 05/28/2020  . Palpitations 02/16/2020  . Dysuria 07/27/2019  . Piriformis syndrome 03/29/2019  . Humerus fracture 01/27/2019  . Closed fracture of left proximal humerus 12/07/2018   . Trochanteric bursitis of right hip 07/12/2018  . Trochanteric bursitis of both hips 03/23/2018  . Neck pain 02/11/2018  . Peroneal mononeuropathy, left 01/26/2017  . Skin cyst 09/26/2016  . Adjustment disorder with mixed anxiety and depressed mood 06/27/2016  . Fatigue 03/28/2016  . Restless leg syndrome 03/28/2016  . Right lower quadrant abdominal pain 08/15/2015  . Well adult exam 07/03/2015  . Meralgia paraesthetica 05/29/2015  . Paresthesia 05/15/2015  . Sciatic leg pain 09/22/2014  . Rash and nonspecific skin eruption 09/11/2014  . Right lower quadrant pain 09/11/2014  . Nonspecific abnormal electrocardiogram (ECG) (EKG) 07/27/2014  . Precordial pain 07/27/2014  . Cerumen impaction 06/16/2014  . Chest pain, atypical 03/03/2014  . UTI (urinary tract infection) 03/29/2013  . Herpes zoster 03/02/2013  . Abscess of left leg 02/02/2013  . Leg pain, bilateral 12/09/2012  . Left groin pain 09/09/2012  . Unspecified sinusitis (chronic) 08/25/2012  . Chest wall pain 05/20/2012  . Nocturia 12/24/2011  . Psychosomatic pain 10/14/2011  . Left shoulder pain 08/22/2011  . Vertigo 06/13/2011  . Syncopal vertigo 06/13/2011  . Intertriginous candidiasis 03/07/2011  . Diabetes type 2, controlled ([place]) 03/07/2011  . HIP PAIN 06/17/2010  . HYPERGLYCEMIA 05/15/2010  . CENTRAL SLEEP APNEA CONDS CLASSIFIED ELSEWHERE 05/14/2010  . Obstructive sleep apnea 04/17/2010  . Obesity 04/10/2010  . 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 DYSPNEA 03/15/2010  . TRIGGER FINGER 12/18/2009  . ABDOMINAL PAIN, EPIGASTRIC 12/18/2009  . KNEE PAIN 07/11/2009  . NEURALGIA,  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/2009  . COLD SORE 05/11/2009  . T M J 05/11/2009  . BREAST PAIN 04/13/2009  . Edema 03/01/2009  . CERVICAL LYMPHADENOPATHY 03/01/2009  . PRURITUS 07/28/2008  . TACHYCARDIA 07/28/2008  . INSOMNIA, PERSISTENT 02/22/2008  . Cystitis 02/22/2008  . SWEATING 01/18/2008  . BRONCHITIS, ACUTE 12/08/2007  . Chronic fatigue  12/08/2007  . Headache(784.0) 12/08/2007  . COUGH 12/08/2007  . ARTHRALGIA 11/19/2007  . LOW BACK PAIN 01/27/2007  . Tobacco use disorder 11/28/2006  . Generalized anxiety disorder 05/25/2006  . Essential hypertension 05/25/2006  . COPD exacerbation ([place]) 05/25/2006  . OSTEOARTHRITIS 05/25/2006  . Polyuria 05/25/2006  . Dyslipidemia 04/30/2006  . Myalgia 04/02/2006  . Anxiety and depression 01/02/2006  . Disturbance of skin sensation 05/29/2005  . ESOTROPIA, LEFT EYE 1945/10/08    Past Surgical History:  Procedure Laterality Date  . CHOLECYSTECTOMY    . MASS EXCISION Left 10/06/2016   Procedure: EXCISION LEFT BUTTOCKS MASS;  Surgeon: Alphonsa Overall, MD;  Location: [facility];  Service: General;  Laterality: Left;     OB History   No obstetric history on file.     Family History  Problem Relation Age of Onset  . Heart attack Father   . Hypertension Brother   . Heart disease Mother   . Colon cancer Neg Hx   . Stomach cancer Neg Hx     Social History   Tobacco Use  . Smoking status: Current Some Day Smoker    Types: Cigarettes    Last attempt to quit: 07/10/2013    Years since quitting: 7.0  . Smokeless tobacco: Current User    Types: Snuff  . Tobacco comment: smokes 2-4 cigs, maybe more when anxious  Vaping Use  . Vaping Use: Never used  Substance Use Topics  . Alcohol use: No    Alcohol/week: 0.0 standard drinks  . Drug use: No    Home Medications Prior to Admission medications   Medication Sig Start Date End Date Taking? Authorizing Provider  celecoxib (CELEBREX) 200 MG capsule Take 1 capsule (200 mg total) by mouth daily as needed for moderate pain. 01/24/20   Plotnikov, Evie Lacks, MD  Cholecalciferol (VITAMIN D3) 2000 UNITS capsule Take 1 capsule (2,000 Units total) by mouth daily. 08/22/14   Plotnikov, Evie Lacks, MD  gabapentin (NEURONTIN) 300 MG capsule Take 1 capsule (300 mg total) by mouth 3 (three) times daily. 05/28/20   Plotnikov,  Evie Lacks, MD  HYDROcodone-acetaminophen (NORCO) 7.5-325 MG tablet Take 1 tablet by mouth every 8 (eight) hours as needed for severe pain. 05/28/20   Plotnikov, Evie Lacks, MD  ibuprofen (ADVIL) 200 MG tablet Take 600 mg by mouth every 6 (six) hours as needed for moderate pain.    [provider]  metoprolol succinate (TOPROL-XL) 25 MG 24 hr tablet TAKE 1/2 TABLET(12.5 MG) BY MOUTH IN AM AND 1/2 PM 05/16/20   Plotnikov, Evie Lacks, MD  rosuvastatin (CRESTOR) 10 MG tablet Take 1 tablet (10 mg total) by mouth daily. 04/25/20   Jerline Pain, MD    Allergies    Morphine and related, Bupropion hcl, Ezetimibe, Metformin and related, Naproxen, Trazodone hcl, and Tramadol hcl  Review of Systems   Review of Systems  Constitutional: Positive for fatigue.  Respiratory: Positive for cough and shortness of breath.   Gastrointestinal: Positive for diarrhea.  All other systems reviewed and are negative.   Physical Exam Updated Vital Signs BP (!) 147/80 (BP Location: Right Arm)   Pulse 82  Temp 99.3 F (37.4 C) (Oral)   Resp 12   SpO2 93%   Physical Exam CONSTITUTIONAL: Elderly, chronically ill-appearing HEAD: Normocephalic/atraumatic EYES: EOMI/PERRL ENMT: Mucous membranes moist, hirsutism NECK: supple no meningeal signs SPINE/BACK:entire spine nontender CV: S1/S2 noted, no murmurs/rubs/gallops noted LUNGS: Lungs are clear to auscultation bilaterally, no apparent distress ABDOMEN: soft, nontender, no rebound or guarding, bowel sounds noted throughout abdomen GU:no cva tenderness NEURO: Pt is awake/alert/appropriate, moves all extremitiesx4.  No facial droop.   EXTREMITIES: pulses normal/equal, full ROM SKIN: warm, color normal PSYCH: no abnormalities of mood noted, alert and oriented to situation  ED Results / Procedures / Treatments   Labs (all labs ordered are listed, but only abnormal results are displayed) Labs Reviewed  SARS CORONAVIRUS 2 (TAT 6-24 HRS) - Abnormal;  Notable for the following components:      Result Value   SARS Coronavirus 2 POSITIVE (*)    All other components within normal limits  COMPREHENSIVE METABOLIC PANEL - Abnormal; Notable for the following components:   Glucose, Bld 114 (*)    Calcium 8.6 (*)    Total Protein 6.4 (*)    All other components within normal limits  CBC - Abnormal; Notable for the following components:   WBC 3.9 (*)    Platelets 111 (*)    All other components within normal limits  URINALYSIS, ROUTINE W REFLEX MICROSCOPIC - Abnormal; Notable for the following components:   Specific Gravity, Urine >1.030 (*)    Hgb urine dipstick SMALL (*)    Bilirubin Urine MODERATE (*)    Ketones, ur 40 (*)    Protein, ur 30 (*)    Leukocytes,Ua TRACE (*)    All other components within normal limits  URINALYSIS, MICROSCOPIC (REFLEX) - Abnormal; Notable for the following components:   Bacteria, UA RARE (*)    All other components within normal limits  LIPASE, BLOOD    EKG EKG Interpretation  Date/Time:  Wednesday July 25 2020 16:36:29 EST Ventricular Rate:  78 PR Interval:  146 QRS Duration: 80 QT Interval:  402 QTC Calculation: 458 R Axis:   0 Text Interpretation: Normal sinus rhythm Nonspecific ST and T wave abnormality Abnormal ECG When compared with ECG of 02/08/2018, No significant change was found Confirmed by Delora Fuel ([id number]) on 07/26/2020 12:32:28 AM   Radiology DG Chest Port 1 View  Result Date: 07/26/2020 CLINICAL DATA:  75 year old female with 1 week of cough and cold symptoms. COVID-19. EXAM: PORTABLE CHEST 1 VIEW COMPARISON:  Cardiac CT 04/24/2020. Chest radiographs 02/08/2018 and earlier. FINDINGS: Portable AP view at 0422 hours. Lung volumes and mediastinal contours are stable since 2019, within normal limits. Calcified aortic atherosclerosis. Visualized tracheal air column is within normal limits. Stable lung markings. Allowing for portable technique the lungs are clear. No pneumothorax. No  acute osseous abnormality identified. IMPRESSION: No acute cardiopulmonary abnormality. Aortic Atherosclerosis (ICD10-I70.0). Electronically Signed   By: Genevie Ann M.D.   On: 07/26/2020 04:40    Procedures Procedures  Medications Ordered in ED Medications  ondansetron (ZOFRAN) injection 4 mg (4 mg Intravenous Given 07/26/20 0422)  lactated ringers bolus 500 mL (500 mLs Intravenous New Bag/Given 07/26/20 0507)    ED Course  I have reviewed the triage vital signs and the nursing notes.  Pertinent labs & imaging results that were available during my care of the patient were reviewed by me and considered in my medical decision making (see chart for details).    MDM Rules/Calculators/A&P  6:43 AM Pt presented with multiple complaints Found positive for COVID Pt improved with fluids/zofran She refuses remdesivir and requesting d/c home Pulse ox 94% on RA Will d/c home  DESTENY FREEMAN was evaluated in Emergency Department on 07/26/2020 for the symptoms described in the history of present illness. She was evaluated in the context of the global COVID-19 pandemic, which necessitated consideration that the patient might be at risk for infection with the SARS-CoV-2 virus that causes COVID-19. Institutional protocols and algorithms that pertain to the evaluation of patients at risk for COVID-19 are in a state of rapid change based on information released by regulatory bodies including the CDC and federal and state organizations. These policies and algorithms were followed during the patient's care in the ED.  Final Clinical Impression(s) / ED Diagnoses Final diagnoses:  [id number]    Rx / DC Orders ED Discharge Orders    None       Ripley Fraise, MD 07/26/20 [phone]

## 2020-07-26 NOTE — Telephone Encounter (Signed)
Inbound call from patient's daughter stating patient tested positive for covid yesterday and has a procedure scheduled at [facility] on 08/02/2020.  Wanting to reschedule appt.

## 2020-07-26 NOTE — Telephone Encounter (Signed)
FYI Dr Ardis Hughs and [place]

## 2020-07-26 NOTE — Telephone Encounter (Signed)
The pt has been rescheduled and re instructed for 09/06/20 at 730 am.  No covid test needed due to positive result.  I will mail a letter to the pt with the new instructions as well.

## 2020-07-26 NOTE — Telephone Encounter (Signed)
Amanda Arnold, put her on Wed/Thur/Fri of my hospital week. First case [facility] or [facility]. Thank you. GM

## 2020-07-26 NOTE — ED Notes (Signed)
Patients daughter would like a callback with an update if possible, she is aware patient is up for discharge. Burman Freestone [phone]

## 2020-07-26 NOTE — Telephone Encounter (Signed)
Appt has been changed to Dr Rush Landmark on 08/16/20 at [facility] at 41 am.

## 2020-07-26 NOTE — Telephone Encounter (Signed)
Chester Holstein, you mentioned today that you were OK to do some EUS cases your upcoming hospital week. Thanks for considering.

## 2020-07-26 NOTE — ED Notes (Signed)
Pt assisted with bed pan

## 2020-07-26 NOTE — Telephone Encounter (Signed)
Dr Rush Landmark Dr Ardis Hughs would like to know if you could do this pt during your hospital week? She was scheduled for 1/20 but has tested positive for COVID.  She was rescheduled to 2/24 but that is too far out.  Thanks

## 2020-07-26 NOTE — Telephone Encounter (Signed)
Called to discuss with patient about COVID-19 symptoms and the use of one of the available treatments for those with mild to moderate Covid symptoms and at a high risk of hospitalization.  Pt appears to qualify for outpatient treatment due to co-morbid conditions and/or a member of an at-risk group in accordance with the FDA Emergency Use Authorization.    Symptom onset: "Over a week ago." Vaccinated: No Booster? No Immunocompromised? No Qualifiers: HTN,COPD,Diabetes.  Unable to reach pt - Reached pt. Out of treatment window.  Amanda Arnold

## 2020-07-27 ENCOUNTER — Emergency Department (HOSPITAL_COMMUNITY)
Admission: EM | Admit: 2020-07-27 | Discharge: 2020-07-27 | Disposition: A | Discharge status: Home / Self Care | Payer: Medicare Other | Attending: Emergency Medicine | Admitting: Emergency Medicine

## 2020-07-27 ENCOUNTER — Other Ambulatory Visit: Payer: Medicare Other

## 2020-07-27 ENCOUNTER — Encounter (HOSPITAL_COMMUNITY): Payer: Self-pay | Admitting: *Deleted

## 2020-07-27 ENCOUNTER — Other Ambulatory Visit: Payer: Self-pay

## 2020-07-27 ENCOUNTER — Telehealth: Payer: Self-pay | Admitting: Internal Medicine

## 2020-07-27 DIAGNOSIS — R197 Diarrhea, unspecified: Secondary | ICD-10-CM | POA: Diagnosis present

## 2020-07-27 DIAGNOSIS — Z79899 Other long term (current) drug therapy: Secondary | ICD-10-CM | POA: Insufficient documentation

## 2020-07-27 DIAGNOSIS — F17210 Nicotine dependence, cigarettes, uncomplicated: Secondary | ICD-10-CM | POA: Diagnosis not present

## 2020-07-27 DIAGNOSIS — K8689 Other specified diseases of pancreas: Secondary | ICD-10-CM | POA: Insufficient documentation

## 2020-07-27 DIAGNOSIS — E119 Type 2 diabetes mellitus without complications: Secondary | ICD-10-CM | POA: Insufficient documentation

## 2020-07-27 DIAGNOSIS — I10 Essential (primary) hypertension: Secondary | ICD-10-CM | POA: Insufficient documentation

## 2020-07-27 DIAGNOSIS — J449 Chronic obstructive pulmonary disease, unspecified: Secondary | ICD-10-CM | POA: Diagnosis not present

## 2020-07-27 DIAGNOSIS — U071 COVID-19: Secondary | ICD-10-CM

## 2020-07-27 LAB — COMPREHENSIVE METABOLIC PANEL
ALT: 22 U/L (ref 0–44)
AST: 34 U/L (ref 15–41)
Albumin: 3.6 g/dL (ref 3.5–5.0)
Alkaline Phosphatase: 53 U/L (ref 38–126)
Anion gap: 14 (ref 5–15)
BUN: 20 mg/dL (ref 8–23)
CO2: 24 mmol/L (ref 22–32)
Calcium: 9 mg/dL (ref 8.9–10.3)
Chloride: 99 mmol/L (ref 98–111)
Creatinine, Ser: 0.76 mg/dL (ref 0.44–1.00)
GFR, Estimated: >60 mL/min (ref >60)
Glucose, Bld: 97 mg/dL (ref 70–99)
Potassium: 3.6 mmol/L (ref 3.5–5.1)
Sodium: 137 mmol/L (ref 135–145)
Total Bilirubin: 1.1 mg/dL (ref 0.3–1.2)
Total Protein: 6.6 g/dL (ref 6.5–8.1)

## 2020-07-27 LAB — CBC
HCT: 40 % (ref 36.0–46.0)
Hemoglobin: 13 g/dL (ref 12.0–15.0)
MCH: 30.4 pg (ref 26.0–34.0)
MCHC: 32.5 g/dL (ref 30.0–36.0)
MCV: 93.5 fL (ref 80.0–100.0)
Platelets: 122 10*3/uL — ABNORMAL LOW (ref 150–400)
RBC: 4.28 MIL/uL (ref 3.87–5.11)
RDW: 12.6 % (ref 11.5–15.5)
WBC: 4.3 10*3/uL (ref 4.0–10.5)
nRBC: 0 % (ref 0.0–0.2)

## 2020-07-27 LAB — LIPASE, BLOOD: Lipase: 299 U/L — ABNORMAL HIGH (ref 11–51)

## 2020-07-27 LAB — POC OCCULT BLOOD, ED: Fecal Occult Bld: NEGATIVE

## 2020-07-27 MED ORDER — ONDANSETRON 8 MG PO TBDP: 8.0000 mg | ORAL_TABLET | Freq: Three times a day (TID) | ORAL | 0 refills | Status: DC | PRN

## 2020-07-27 NOTE — Telephone Encounter (Signed)
Notified pt/daughter w/MD response../lmb 

## 2020-07-27 NOTE — Discharge Instructions (Addendum)
Take the Zofran to help with nausea.  Your appetite may be affected by both the pancreatic mass and the COVID-19.  Monitor for difficulty breathing or other worsening symptoms.  The Pepto-Bismol is fine to take for diarrhea but it does make your stools black.  This is a normal side effect of the Pepto-Bismol.

## 2020-07-27 NOTE — Telephone Encounter (Addendum)
° °  Patient's daughter calling for advice for black diarrhea Patient tested positive for COVID Recent hospital visit she was given fluids for dehydration. Daughter has concerns about ongoing diarrhea  Seeking advice for diarrhea

## 2020-07-27 NOTE — Telephone Encounter (Signed)
If she took Pepto-Bismol, black diarrhea would be normal. If she did not take Pepto-Bismol, black diarrhea could be a sign of gastrointestinal bleeding she needs to go to the ER.  For a mild COVID-19 case you  take zinc 50 mg a day for 1 week, vitamin C 1000 mg daily for 1 week, vitamin D. Maintain good oral hydration and take Tylenol with high fever. Thx

## 2020-07-27 NOTE — ED Triage Notes (Signed)
Covid + reports black diarrhea x 1 week, daughter called PCP and told her to sned her to ED. Pt now states she had her last stool this morning and it was not all black like she originally stated

## 2020-07-27 NOTE — ED Provider Notes (Signed)
[facility] DEPT Provider Note   CSN: [id number] Arrival date & time: 07/27/20  1715     History Chief Complaint  Patient presents with  . Covid Positive  . Diarrhea    Amanda Arnold is a 75 y.o. female.  HPI   Patient states she has been having issues with frequent dark black diarrhea for the last week.  Patient states she has tried taking Imodium but has not had much relief until today.  Patient states she has not had any diarrhea today.  She has had persistent nausea and decreased appetite.  Foods do not taste like anything.  She denies any fevers or chills.  She is having some abdominal cramping on both lower sides.  Patient was seen in the emergency room on the 12th.  Patient ended up testing positive for COVID.  She was treated in the ED at that time with fluids and Zofran.  Patient did not want any treatment with remdesivir.  Patient still feels like she is breathing easily.  Other than her nausea and diarrhea she would not know that she had covid Past Medical History:  Diagnosis Date  . Abdominal pain, epigastric 12/18/2009  . ANXIETY 05/25/2006  . BRONCHITIS, ACUTE 12/08/2007  . COPD 05/25/2006  . DEPRESSION 01/02/2006  . Diabetes mellitus without complication ([place])   . ESOTROPIA, LEFT EYE 1946-04-27  . FIBROMYALGIA 04/02/2006  . Headache(784.0) 12/08/2007  . HYPERGLYCEMIA 05/15/2010  . HYPERLIPIDEMIA 04/30/2006  . HYPERTENSION 05/25/2006  . Hypertension   . INSOMNIA, PERSISTENT 02/22/2008  . Lazy eye    left  . LOW BACK PAIN 01/27/2007  . Migraines   . 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 OBSTRUCTIVE SLEEP APNEA 04/17/2010   does not use CPAP  . OSTEOARTHRITIS 05/25/2006  . Polyuria   . Pre-diabetes   . Psychosomatic disease 2011  . 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 T M J 05/11/2009    Patient Active Problem List   Diagnosis Date Noted  . Shoulder pain, right 07/10/2020  . Pancreatic mass 06/26/2020  . Coronary atherosclerosis  05/28/2020  . Pulmonary nodule 05/28/2020  . Palpitations 02/16/2020  . Dysuria 07/27/2019  . Piriformis syndrome 03/29/2019  . Humerus fracture 01/27/2019  . Closed fracture of left proximal humerus 12/07/2018  . Trochanteric bursitis of right hip 07/12/2018  . Trochanteric bursitis of both hips 03/23/2018  . Neck pain 02/11/2018  . Peroneal mononeuropathy, left 01/26/2017  . Skin cyst 09/26/2016  . Adjustment disorder with mixed anxiety and depressed mood 06/27/2016  . Fatigue 03/28/2016  . Restless leg syndrome 03/28/2016  . Right lower quadrant abdominal pain 08/15/2015  . Well adult exam 07/03/2015  . Meralgia paraesthetica 05/29/2015  . Paresthesia 05/15/2015  . Sciatic leg pain 09/22/2014  . Rash and nonspecific skin eruption 09/11/2014  . Right lower quadrant pain 09/11/2014  . Nonspecific abnormal electrocardiogram (ECG) (EKG) 07/27/2014  . Precordial pain 07/27/2014  . Cerumen impaction 06/16/2014  . Chest pain, atypical 03/03/2014  . UTI (urinary tract infection) 03/29/2013  . Herpes zoster 03/02/2013  . Abscess of left leg 02/02/2013  . Leg pain, bilateral 12/09/2012  . Left groin pain 09/09/2012  . Unspecified sinusitis (chronic) 08/25/2012  . Chest wall pain 05/20/2012  . Nocturia 12/24/2011  . Psychosomatic pain 10/14/2011  . Left shoulder pain 08/22/2011  . Vertigo 06/13/2011  . Syncopal vertigo 06/13/2011  . Intertriginous candidiasis 03/07/2011  . Diabetes type 2, controlled ([place]) 03/07/2011  . HIP PAIN 06/17/2010  . HYPERGLYCEMIA 05/15/2010  .  CENTRAL SLEEP APNEA CONDS CLASSIFIED ELSEWHERE 05/14/2010  . Obstructive sleep apnea 04/17/2010  . Obesity 04/10/2010  . 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 DYSPNEA 03/15/2010  . TRIGGER FINGER 12/18/2009  . ABDOMINAL PAIN, EPIGASTRIC 12/18/2009  . KNEE PAIN 07/11/2009  . 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 COLD SORE 05/11/2009  . T M J 05/11/2009  . BREAST PAIN 04/13/2009  . Edema 03/01/2009  . CERVICAL  LYMPHADENOPATHY 03/01/2009  . PRURITUS 07/28/2008  . TACHYCARDIA 07/28/2008  . INSOMNIA, PERSISTENT 02/22/2008  . Cystitis 02/22/2008  . SWEATING 01/18/2008  . BRONCHITIS, ACUTE 12/08/2007  . Chronic fatigue 12/08/2007  . Headache(784.0) 12/08/2007  . COUGH 12/08/2007  . ARTHRALGIA 11/19/2007  . LOW BACK PAIN 01/27/2007  . Tobacco use disorder 11/28/2006  . Generalized anxiety disorder 05/25/2006  . Essential hypertension 05/25/2006  . COPD exacerbation ([place]) 05/25/2006  . OSTEOARTHRITIS 05/25/2006  . Polyuria 05/25/2006  . Dyslipidemia 04/30/2006  . Myalgia 04/02/2006  . Anxiety and depression 01/02/2006  . Disturbance of skin sensation 05/29/2005  . ESOTROPIA, LEFT EYE 11/23/1945    Past Surgical History:  Procedure Laterality Date  . CHOLECYSTECTOMY    . MASS EXCISION Left 10/06/2016   Procedure: EXCISION LEFT BUTTOCKS MASS;  Surgeon: Alphonsa Overall, MD;  Location: [facility];  Service: General;  Laterality: Left;     OB History   No obstetric history on file.     Family History  Problem Relation Age of Onset  . Heart attack Father   . Hypertension Brother   . Heart disease Mother   . Colon cancer Neg Hx   . Stomach cancer Neg Hx     Social History   Tobacco Use  . Smoking status: Current Some Day Smoker    Types: Cigarettes    Last attempt to quit: 07/10/2013    Years since quitting: 7.0  . Smokeless tobacco: Current User    Types: Snuff  . Tobacco comment: smokes 2-4 cigs, maybe more when anxious  Vaping Use  . Vaping Use: Never used  Substance Use Topics  . Alcohol use: No    Alcohol/week: 0.0 standard drinks  . Drug use: No    Home Medications Prior to Admission medications   Medication Sig Start Date End Date Taking? Authorizing Provider  ondansetron (ZOFRAN ODT) 8 MG disintegrating tablet Take 1 tablet (8 mg total) by mouth every 8 (eight) hours as needed for nausea or vomiting. 07/27/20  Yes Dorie Rank, MD  celecoxib (CELEBREX)  200 MG capsule Take 1 capsule (200 mg total) by mouth daily as needed for moderate pain. 01/24/20   Plotnikov, Evie Lacks, MD  Cholecalciferol (VITAMIN D3) 2000 UNITS capsule Take 1 capsule (2,000 Units total) by mouth daily. 08/22/14   Plotnikov, Evie Lacks, MD  gabapentin (NEURONTIN) 300 MG capsule Take 1 capsule (300 mg total) by mouth 3 (three) times daily. 05/28/20   Plotnikov, Evie Lacks, MD  HYDROcodone-acetaminophen (NORCO) 7.5-325 MG tablet Take 1 tablet by mouth every 8 (eight) hours as needed for severe pain. 05/28/20   Plotnikov, Evie Lacks, MD  ibuprofen (ADVIL) 200 MG tablet Take 600 mg by mouth every 6 (six) hours as needed for moderate pain.    [provider]  metoprolol succinate (TOPROL-XL) 25 MG 24 hr tablet TAKE 1/2 TABLET(12.5 MG) BY MOUTH IN AM AND 1/2 PM 05/16/20   Plotnikov, Evie Lacks, MD  rosuvastatin (CRESTOR) 10 MG tablet Take 1 tablet (10 mg total) by mouth daily. 04/25/20   Jerline Pain, MD  Allergies    Morphine and related, Bupropion hcl, Ezetimibe, Metformin and related, Naproxen, Trazodone hcl, and Tramadol hcl  Review of Systems   Review of Systems  All other systems reviewed and are negative.   Physical Exam Updated Vital Signs BP 107/68 (BP Location: Left Arm)   Pulse 73   Temp 98.7 F (37.1 C) (Oral)   Resp 16   Ht 1.575 m (5\' 2" )   Wt 83 kg   SpO2 95%   BMI 33.47 kg/m   Physical Exam Vitals and nursing note reviewed.  Constitutional:      Appearance: She is well-developed and well-nourished. She is not toxic-appearing or diaphoretic.  HENT:     Head: Normocephalic and atraumatic.     Right Ear: External ear normal.     Left Ear: External ear normal.  Eyes:     General: No scleral icterus.       Right eye: No discharge.        Left eye: No discharge.     Conjunctiva/sclera: Conjunctivae normal.  Neck:     Trachea: No tracheal deviation.  Cardiovascular:     Rate and Rhythm: Normal rate and regular rhythm.     Pulses: Intact  distal pulses.  Pulmonary:     Effort: Pulmonary effort is normal. No respiratory distress.     Breath sounds: Normal breath sounds. No stridor. No wheezing or rales.  Abdominal:     General: Bowel sounds are normal. There is no distension.     Palpations: Abdomen is soft.     Tenderness: There is no abdominal tenderness. There is no guarding or rebound.  Genitourinary:    Comments: Dark stool on rectal exam, no blood Musculoskeletal:        General: No tenderness or edema.     Cervical back: Neck supple.  Skin:    General: Skin is warm and dry.     Findings: No rash.  Neurological:     Mental Status: She is alert.     Cranial Nerves: No cranial nerve deficit (no facial droop, extraocular movements intact, no slurred speech).     Sensory: No sensory deficit.     Motor: No abnormal muscle tone or seizure activity.     Coordination: Coordination normal.     Deep Tendon Reflexes: Strength normal.  Psychiatric:        Mood and Affect: Mood and affect normal.     ED Results / Procedures / Treatments   Labs (all labs ordered are listed, but only abnormal results are displayed) Labs Reviewed  LIPASE, BLOOD - Abnormal; Notable for the following components:      Result Value   Lipase 299 (*)    All other components within normal limits  CBC - Abnormal; Notable for the following components:   Platelets 122 (*)    All other components within normal limits  COMPREHENSIVE METABOLIC PANEL  URINALYSIS, ROUTINE W REFLEX MICROSCOPIC  POC OCCULT BLOOD, ED    EKG None  Radiology DG Chest Port 1 View  Result Date: 07/26/2020 CLINICAL DATA:  75 year old female with 1 week of cough and cold symptoms. COVID-19. EXAM: PORTABLE CHEST 1 VIEW COMPARISON:  Cardiac CT 04/24/2020. Chest radiographs 02/08/2018 and earlier. FINDINGS: Portable AP view at 0422 hours. Lung volumes and mediastinal contours are stable since 2019, within normal limits. Calcified aortic atherosclerosis. Visualized  tracheal air column is within normal limits. Stable lung markings. Allowing for portable technique the lungs are clear. No pneumothorax. No  acute osseous abnormality identified. IMPRESSION: No acute cardiopulmonary abnormality. Aortic Atherosclerosis (ICD10-I70.0). Electronically Signed   By: Genevie Ann M.D.   On: 07/26/2020 04:40    Procedures Procedures (including critical care time)  Medications Ordered in ED Medications - No data to display  ED Course  I have reviewed the triage vital signs and the nursing notes.  Pertinent labs & imaging results that were available during my care of the patient were reviewed by me and considered in my medical decision making (see chart for details).  Clinical Course as of 07/27/20 2023  Fri Jul 27, 2020  2010 CBC is normal.  Metabolic panel is normal.  Lipase is elevated at 299 [JK]  2011 Patient had an MRI of her abdomen on December 10.  It showed a pancreatic mass concerning for pancreatic adenocarcinoma [JK]  2016 Patient's fecal occult is negative [JK]    Clinical Course User Index [JK] Dorie Rank, MD   MDM Rules/Calculators/A&P                         LYNNMARIE LOVETT was evaluated in Emergency Department on 07/27/2020 for the symptoms described in the history of present illness. She was evaluated in the context of the global COVID-19 pandemic, which necessitated consideration that the patient might be at risk for infection with the SARS-CoV-2 virus that causes COVID-19. Institutional protocols and algorithms that pertain to the evaluation of patients at risk for COVID-19 are in a state of rapid change based on information released by regulatory bodies including the CDC and federal and state organizations. These policies and algorithms were followed during the patient's care in the ED.  Patient presented to the ED with complaints of diarrhea and dark stools.  Patient was diagnosed earlier last month with a pancreatic mass.  Findings were concerning  for pancreatic cancer and she is scheduled for a GI procedure next month for further evaluation.  Patient was seen in the emergency room couple days ago and tested positive for COVID.  This is likely a contributing factor to her anorexia and decreased taste.  Patient was concerned because she noticed dark black stools.  Patient is not anemic.  Her guaiac is negative.  Patient has been taking Pepto-Bismol and I explained to her this is the likely cause of the dark black stool.  Patient is breathing easily.  She does not have an oxygen requirement.  She is stable for discharge. Final Clinical Impression(s) / ED Diagnoses Final diagnoses:  Pancreatic mass  COVID-19    Rx / DC Orders ED Discharge Orders         Ordered    ondansetron (ZOFRAN ODT) 8 MG disintegrating tablet  Every 8 hours PRN        07/27/20 2023           Dorie Rank, MD 07/27/20 2023

## 2020-07-28 NOTE — Telephone Encounter (Signed)
Thank you for update. GM 

## 2020-07-30 ENCOUNTER — Other Ambulatory Visit (HOSPITAL_COMMUNITY): Admission: RE | Admit: 2020-07-30 | Payer: Medicare Other | Source: Ambulatory Visit

## 2020-07-31 ENCOUNTER — Telehealth: Payer: Self-pay | Admitting: Gastroenterology

## 2020-07-31 NOTE — Telephone Encounter (Signed)
Pt's daughter called again

## 2020-07-31 NOTE — Telephone Encounter (Signed)
Her diarrhea is NOT due to her pancreas lesion.  Not sure about details regarding her pain, etc.  But please check with Dr. Ardis Hughs and the hospital to see what the protocol is for Covid positive.  She may need to be rescheduled.

## 2020-07-31 NOTE — Telephone Encounter (Signed)
Pt's daughter Lucia Bitter called stating that pt's has been experiencing pain and diarrhea. Pt tested positive for Covid last week so she was taken to [facility]. However, MD at hospital referred pt to her GI stating that pt's sxs are not due to Covid, instead they are due to her pancreas. Pt's daughter does not know what to do and would like a call back asap.

## 2020-07-31 NOTE — Telephone Encounter (Signed)
Janett Billow not sure what to do with this pt. She tested positive for COVID last week  Please advise

## 2020-08-01 ENCOUNTER — Encounter (HOSPITAL_COMMUNITY): Payer: Self-pay | Admitting: Emergency Medicine

## 2020-08-01 ENCOUNTER — Other Ambulatory Visit: Payer: Self-pay

## 2020-08-01 DIAGNOSIS — J449 Chronic obstructive pulmonary disease, unspecified: Secondary | ICD-10-CM | POA: Diagnosis not present

## 2020-08-01 DIAGNOSIS — I700 Atherosclerosis of aorta: Secondary | ICD-10-CM | POA: Diagnosis not present

## 2020-08-01 DIAGNOSIS — F17210 Nicotine dependence, cigarettes, uncomplicated: Secondary | ICD-10-CM | POA: Insufficient documentation

## 2020-08-01 DIAGNOSIS — N202 Calculus of kidney with calculus of ureter: Secondary | ICD-10-CM | POA: Diagnosis not present

## 2020-08-01 DIAGNOSIS — R110 Nausea: Secondary | ICD-10-CM | POA: Insufficient documentation

## 2020-08-01 DIAGNOSIS — R109 Unspecified abdominal pain: Secondary | ICD-10-CM | POA: Diagnosis present

## 2020-08-01 DIAGNOSIS — Z79899 Other long term (current) drug therapy: Secondary | ICD-10-CM | POA: Diagnosis not present

## 2020-08-01 DIAGNOSIS — I10 Essential (primary) hypertension: Secondary | ICD-10-CM | POA: Diagnosis not present

## 2020-08-01 DIAGNOSIS — N390 Urinary tract infection, site not specified: Secondary | ICD-10-CM | POA: Diagnosis not present

## 2020-08-01 DIAGNOSIS — E119 Type 2 diabetes mellitus without complications: Secondary | ICD-10-CM | POA: Insufficient documentation

## 2020-08-01 DIAGNOSIS — K760 Fatty (change of) liver, not elsewhere classified: Secondary | ICD-10-CM | POA: Diagnosis not present

## 2020-08-01 DIAGNOSIS — E278 Other specified disorders of adrenal gland: Secondary | ICD-10-CM | POA: Diagnosis not present

## 2020-08-01 LAB — URINALYSIS, ROUTINE W REFLEX MICROSCOPIC
Glucose, UA: NEGATIVE mg/dL
Hgb urine dipstick: NEGATIVE
Ketones, ur: 5 mg/dL — AB
Nitrite: NEGATIVE
Protein, ur: 30 mg/dL — AB
Specific Gravity, Urine: 1.029 (ref 1.005–1.030)
pH: 5 (ref 5.0–8.0)

## 2020-08-01 LAB — COMPREHENSIVE METABOLIC PANEL WITH GFR
ALT: 22 U/L (ref 0–44)
AST: 29 U/L (ref 15–41)
Albumin: 3.9 g/dL (ref 3.5–5.0)
Alkaline Phosphatase: 55 U/L (ref 38–126)
Anion gap: 11 (ref 5–15)
BUN: 10 mg/dL (ref 8–23)
CO2: 27 mmol/L (ref 22–32)
Calcium: 9.4 mg/dL (ref 8.9–10.3)
Chloride: 103 mmol/L (ref 98–111)
Creatinine, Ser: 0.71 mg/dL (ref 0.44–1.00)
GFR, Estimated: >60 mL/min (ref >60)
Glucose, Bld: 122 mg/dL — ABNORMAL HIGH (ref 70–99)
Potassium: 3.9 mmol/L (ref 3.5–5.1)
Sodium: 141 mmol/L (ref 135–145)
Total Bilirubin: 0.9 mg/dL (ref 0.3–1.2)
Total Protein: 7.2 g/dL (ref 6.5–8.1)

## 2020-08-01 LAB — CBC
HCT: 41.3 % (ref 36.0–46.0)
Hemoglobin: 13.5 g/dL (ref 12.0–15.0)
MCH: 30.4 pg (ref 26.0–34.0)
MCHC: 32.7 g/dL (ref 30.0–36.0)
MCV: 93 fL (ref 80.0–100.0)
Platelets: 196 K/uL (ref 150–400)
RBC: 4.44 MIL/uL (ref 3.87–5.11)
RDW: 12.3 % (ref 11.5–15.5)
WBC: 4.8 K/uL (ref 4.0–10.5)
nRBC: 0 % (ref 0.0–0.2)

## 2020-08-01 LAB — LIPASE, BLOOD: Lipase: 38 U/L (ref 11–51)

## 2020-08-01 NOTE — ED Triage Notes (Signed)
Patient states she has a spot on her pancreas, is unable to eat and has L and R sided abdominal pain and R flank pain, unable to tell me how long. Also complains of hip pain, states she feels like she is laying on her bones D/T the weight she has lost D/T not being able to eat. Endorses nausea, denies emesis.

## 2020-08-01 NOTE — Telephone Encounter (Signed)
Noted tried to reach pt to confirm that note has been received.  No answer voicemail left.  Fernand Parkins and Dr Ardis Hughs.

## 2020-08-01 NOTE — Telephone Encounter (Signed)
Pt's daughter is calling wanting to inform the pt has been admitted to [facility] ED.

## 2020-08-01 NOTE — Telephone Encounter (Signed)
Patients daughter is calling to follow up on previous message.

## 2020-08-01 NOTE — Telephone Encounter (Signed)
I spoke with the pt's daughter and confirmed that I did send a message to [facility] and Dr Ardis Hughs as requested.  She states the pt is currently in the ED waiting to be seen for abd pain, weight loss.  The pt did in fact test positive for COVID last week

## 2020-08-02 ENCOUNTER — Emergency Department (HOSPITAL_COMMUNITY): Payer: Medicare Other

## 2020-08-02 ENCOUNTER — Emergency Department (HOSPITAL_COMMUNITY)
Admission: EM | Admit: 2020-08-02 | Discharge: 2020-08-02 | Disposition: A | Discharge status: Home / Self Care | Payer: Medicare Other | Attending: Emergency Medicine | Admitting: Emergency Medicine

## 2020-08-02 DIAGNOSIS — K760 Fatty (change of) liver, not elsewhere classified: Secondary | ICD-10-CM | POA: Diagnosis not present

## 2020-08-02 DIAGNOSIS — I700 Atherosclerosis of aorta: Secondary | ICD-10-CM | POA: Diagnosis not present

## 2020-08-02 DIAGNOSIS — N390 Urinary tract infection, site not specified: Secondary | ICD-10-CM

## 2020-08-02 DIAGNOSIS — E278 Other specified disorders of adrenal gland: Secondary | ICD-10-CM | POA: Diagnosis not present

## 2020-08-02 DIAGNOSIS — N202 Calculus of kidney with calculus of ureter: Secondary | ICD-10-CM | POA: Diagnosis not present

## 2020-08-02 MED ORDER — ONDANSETRON 8 MG PO TBDP
8.0000 mg | ORAL_TABLET | Freq: Once | ORAL | Status: AC
  Administered 2020-08-02: 8 mg via ORAL
  Filled 2020-08-02: qty 1

## 2020-08-02 MED ORDER — FOSFOMYCIN TROMETHAMINE 3 G PO PACK
3.0000 g | PACK | Freq: Once | ORAL | Status: AC
  Administered 2020-08-02: 3 g via ORAL
  Filled 2020-08-02: qty 3

## 2020-08-02 MED ORDER — ONDANSETRON 8 MG PO TBDP: 8.0000 mg | ORAL_TABLET | Freq: Three times a day (TID) | ORAL | 0 refills | Status: DC | PRN

## 2020-08-02 NOTE — ED Notes (Signed)
Upon discharging, pt and family is asking to have nausea meds and diarrhea meds for home. Provider made aware.

## 2020-08-02 NOTE — ED Notes (Signed)
Initial contact with pt. Provider is with pt at bedside, bladder scanning being done by tech. Pt ambulatory to restroom with steady gait.

## 2020-08-02 NOTE — ED Notes (Signed)
Patient transported to CT 

## 2020-08-02 NOTE — ED Provider Notes (Signed)
[facility] DEPT Provider Note: Georgena Spurling, MD, FACEP  CSN: [id number] MRN: [id number] ARRIVAL: 08/01/20 at [place]: [place]  Abdominal Pain   HISTORY OF PRESENT ILLNESS  08/02/20 12:18 AM Amanda Arnold is a 75 y.o. 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   She is here with right flank pain radiating to her right lower quadrant for about the past week.  The pain is moderate and somewhat worse with movement.  She feels like she needs to urinate but feels like she is not emptying her bladder.  She has had little urine output today.  She has had decreased appetite and nausea but no vomiting.  She has lost weight due to her decreased ability to eat.   Past Medical History:  Diagnosis Date  . Abdominal pain, epigastric 12/18/2009  . ANXIETY 05/25/2006  . BRONCHITIS, ACUTE 12/08/2007  . COPD 05/25/2006  . DEPRESSION 01/02/2006  . Diabetes mellitus without complication ([place])   . ESOTROPIA, LEFT EYE Feb 02, 1946  . FIBROMYALGIA 04/02/2006  . Headache(784.0) 12/08/2007  . HYPERGLYCEMIA 05/15/2010  . HYPERLIPIDEMIA 04/30/2006  . HYPERTENSION 05/25/2006  . Hypertension   . INSOMNIA, PERSISTENT 02/22/2008  . Lazy eye    left  . LOW BACK PAIN 01/27/2007  . Migraines   . 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 OBSTRUCTIVE SLEEP APNEA 04/17/2010   does not use CPAP  . OSTEOARTHRITIS 05/25/2006  . Polyuria   . Pre-diabetes   . Psychosomatic disease 2011  . 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 T M J 05/11/2009    Past Surgical History:  Procedure Laterality Date  . CHOLECYSTECTOMY    . MASS EXCISION Left 10/06/2016   Procedure: EXCISION LEFT BUTTOCKS MASS;  Surgeon: Alphonsa Overall, MD;  Location: [facility];  Service: General;  Laterality: Left;    Family History  Problem Relation Age of Onset  . Heart attack Father   . Hypertension Brother   . Heart disease Mother   . Colon cancer Neg Hx   .  Stomach cancer Neg Hx     Social History   Tobacco Use  . Smoking status: Current Some Day Smoker    Types: Cigarettes    Last attempt to quit: 07/10/2013    Years since quitting: 7.0  . Smokeless tobacco: Current User    Types: Snuff  . Tobacco comment: smokes 2-4 cigs, maybe more when anxious  Vaping Use  . Vaping Use: Never used  Substance Use Topics  . Alcohol use: No    Alcohol/week: 0.0 standard drinks  . Drug use: No    Prior to Admission medications   Medication Sig Start Date End Date Taking? Authorizing Provider  celecoxib (CELEBREX) 200 MG capsule Take 1 capsule (200 mg total) by mouth daily as needed for moderate pain. 01/24/20   Plotnikov, Evie Lacks, MD  Cholecalciferol (VITAMIN D3) 2000 UNITS capsule Take 1 capsule (2,000 Units total) by mouth daily. 08/22/14   Plotnikov, Evie Lacks, MD  gabapentin (NEURONTIN) 300 MG capsule Take 1 capsule (300 mg total) by mouth 3 (three) times daily. 05/28/20   Plotnikov, Evie Lacks, MD  HYDROcodone-acetaminophen (NORCO) 7.5-325 MG tablet Take 1 tablet by mouth every 8 (eight) hours as needed for severe pain. 05/28/20   Plotnikov, Evie Lacks, MD  ibuprofen (ADVIL) 200 MG tablet Take 600 mg by mouth every 6 (six) hours as needed for moderate pain.    [provider]  metoprolol succinate (TOPROL-XL)  25 MG 24 hr tablet TAKE 1/2 TABLET(12.5 MG) BY MOUTH IN AM AND 1/2 PM 05/16/20   Plotnikov, Evie Lacks, MD  ondansetron (ZOFRAN ODT) 8 MG disintegrating tablet Take 1 tablet (8 mg total) by mouth every 8 (eight) hours as needed for nausea or vomiting. 07/27/20   Dorie Rank, MD  rosuvastatin (CRESTOR) 10 MG tablet Take 1 tablet (10 mg total) by mouth daily. 04/25/20   Jerline Pain, MD    Allergies Morphine and related, Bupropion hcl, Ezetimibe, Metformin and related, Naproxen, Trazodone hcl, and Tramadol hcl   REVIEW OF SYSTEMS  Negative except as noted here or in the History of Present Illness.   PHYSICAL EXAMINATION  Initial  Vital Signs Blood pressure 126/69, pulse 66, temperature 97.7 F (36.5 C), temperature source Oral, resp. rate 16, SpO2 99 %.  Examination General: Well-developed, well-nourished female in no acute distress; appearance consistent with age of record HENT: normocephalic; atraumatic Eyes: pupils equal, round and reactive to light; extraocular muscles intact; bilateral pseudophakia Neck: supple Heart: regular rate and rhythm Lungs: clear to auscultation bilaterally Abdomen: soft; nondistended; nontender; bowel sounds present GU: Minimal right CVA tenderness Extremities: No deformity; full range of motion; pulses normal Neurologic: Awake, alert and oriented; motor function intact in all extremities and symmetric; no facial droop Skin: Warm and dry Psychiatric: Normal mood and affect   RESULTS  Summary of this visit's results, reviewed and interpreted by myself:   EKG Interpretation  Date/Time:    Ventricular Rate:    PR Interval:    QRS Duration:   QT Interval:    QTC Calculation:   R Axis:     Text Interpretation:        Laboratory Studies: Results for orders placed or performed during the hospital encounter of 08/02/20 (from the past 24 hour(s))  Lipase, blood     Status: None   Collection Time: 08/01/20 10:42 AM  Result Value Ref Range   Lipase 38 11 - 51 U/L  Comprehensive metabolic panel     Status: Abnormal   Collection Time: 08/01/20 10:42 AM  Result Value Ref Range   Sodium 141 135 - 145 mmol/L   Potassium 3.9 3.5 - 5.1 mmol/L   Chloride 103 98 - 111 mmol/L   CO2 27 22 - 32 mmol/L   Glucose, Bld 122 (H) 70 - 99 mg/dL   BUN 10 8 - 23 mg/dL   Creatinine, Ser 0.71 0.44 - 1.00 mg/dL   Calcium 9.4 8.9 - 10.3 mg/dL   Total Protein 7.2 6.5 - 8.1 g/dL   Albumin 3.9 3.5 - 5.0 g/dL   AST 29 15 - 41 U/L   ALT 22 0 - 44 U/L   Alkaline Phosphatase 55 38 - 126 U/L   Total Bilirubin 0.9 0.3 - 1.2 mg/dL   GFR, Estimated >60 >60 mL/min   Anion gap 11 5 - 15  CBC      Status: None   Collection Time: 08/01/20 10:42 AM  Result Value Ref Range   WBC 4.8 4.0 - 10.5 K/uL   RBC 4.44 3.87 - 5.11 MIL/uL   Hemoglobin 13.5 12.0 - 15.0 g/dL   HCT 41.3 36.0 - 46.0 %   MCV 93.0 80.0 - 100.0 fL   MCH 30.4 26.0 - 34.0 pg   MCHC 32.7 30.0 - 36.0 g/dL   RDW 12.3 11.5 - 15.5 %   Platelets 196 150 - 400 K/uL   nRBC 0.0 0.0 - 0.2 %  Urinalysis, Routine  w reflex microscopic Urine, Clean Catch     Status: Abnormal   Collection Time: 08/01/20  4:26 PM  Result Value Ref Range   Color, Urine AMBER (A) YELLOW   APPearance HAZY (A) CLEAR   Specific Gravity, Urine 1.029 1.005 - 1.030   pH 5.0 5.0 - 8.0   Glucose, UA NEGATIVE NEGATIVE mg/dL   Hgb urine dipstick NEGATIVE NEGATIVE   Bilirubin Urine SMALL (A) NEGATIVE   Ketones, ur 5 (A) NEGATIVE mg/dL   Protein, ur 30 (A) NEGATIVE mg/dL   Nitrite NEGATIVE NEGATIVE   Leukocytes,Ua SMALL (A) NEGATIVE   RBC / HPF 6-10 0 - 5 RBC/hpf   WBC, UA 11-20 0 - 5 WBC/hpf   Bacteria, UA FEW (A) NONE SEEN   Squamous Epithelial / LPF 11-20 0 - 5   Mucus PRESENT    Hyaline Casts, UA PRESENT    Ca Oxalate Crys, UA PRESENT    Imaging Studies: CT Renal Stone Study  Result Date: 08/02/2020 CLINICAL DATA:  Flank pain.  Known pancreatic mass. EXAM: CT ABDOMEN AND PELVIS WITHOUT CONTRAST TECHNIQUE: Multidetector CT imaging of the abdomen and pelvis was performed following the standard protocol without IV contrast. COMPARISON:  June 01, 2020 FINDINGS: Lower chest: There is scattered airspace opacities throughout the lung bases bilaterally. There is a pulmonary nodule in the right middle lobe that appears stable.The heart is enlarged. Hepatobiliary: There is borderline hepatic steatosis. Status post cholecystectomy.There is no biliary ductal dilation. Pancreas: Again noted is a pancreatic mass involving the pancreatic tail this appears relatively similar in size when compared to the patient's prior study. Spleen: The spleen is enlarged  measuring approximately 12 cm craniocaudad. 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 Adrenals/Urinary Tract: --Adrenal glands: There is stable bilateral adrenal nodules. --Right kidney/ureter: There is a nonobstructing 4 mm stone in lower pole the right kidney. --Left kidney/ureter: There are punctate nonobstructing stones in the left kidney. --Urinary bladder: Unremarkable. Stomach/Bowel: --Stomach/Duodenum: No hiatal hernia or other gastric abnormality. Normal duodenal course and caliber. --Small bowel: Unremarkable. --Colon: There is scattered colonic diverticula without CT evidence for diverticulitis. --Appendix: Normal. Vascular/Lymphatic: Atherosclerotic calcification is present within the non-aneurysmal abdominal aorta, without hemodynamically significant stenosis. --No retroperitoneal lymphadenopathy. --No mesenteric lymphadenopathy. --No pelvic or inguinal lymphadenopathy. Reproductive: Unremarkable Other: No ascites or free air. The abdominal wall is normal. Musculoskeletal. Degenerative changes throughout the visualized thoracolumbar spine without evidence for an acute compression fracture. IMPRESSION: 1. No acute intra-abdominal or pelvic process. 2. Stable appearance of the pancreatic tail mass. 3. Bilateral nonobstructing nephrolithiasis. 4. Scattered airspace opacities throughout the lung bases bilaterally. These are new since prior study and are favored to represent an infectious or inflammatory process. However, given the presence of a pancreatic tail mass, developing metastatic disease is not entirely excluded. 5. Persistent but essentially stable pulmonary nodule in the right middle lobe. This may be benign or malignant. 6. 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 Aortic Atherosclerosis (ICD10-I70.0). Electronically Signed   By: Constance Holster M.D.   On: 08/02/2020 02:36    ED COURSE and MDM   Nursing notes, initial and subsequent vitals signs, including pulse oximetry, reviewed and interpreted by myself.  Vitals:   08/01/20 1900 08/01/20 2226 08/02/20 0054 08/02/20 0203  BP: 131/74 126/69 124/60 131/65  Pulse: 71 66 77 61  Resp: 18 16 19 16   Temp: 97.7 F (36.5 C)     TempSrc:  Oral     SpO2: 94% 99% 98% 95%   Medications  fosfomycin (MONUROL) packet 3 g (has no administration in time range)  ondansetron (ZOFRAN-ODT) disintegrating tablet 8 mg (8 mg Oral Given 08/02/20 0130)   2:40 AM Will treat for urinary tract infection.  Urine sent for culture.  Patient has procedure pending for further evaluation of her pancreatic mass.  Lung changes seen on CT are likely due to recent COVID infection.   PROCEDURES  Procedures   ED DIAGNOSES     ICD-10-CM   1. Lower urinary tract infectious disease  N39.0        Wilfred Dayrit, MD 08/02/20 [phone]

## 2020-08-02 NOTE — ED Notes (Signed)
Pt noted to have 58 mL of urine in bladder. Pt given specimen cup and advised to attempt to collect a urine sample.

## 2020-08-04 LAB — URINE CULTURE

## 2020-08-15 ENCOUNTER — Other Ambulatory Visit: Payer: Self-pay

## 2020-08-15 ENCOUNTER — Encounter (HOSPITAL_COMMUNITY): Payer: Self-pay | Admitting: Gastroenterology

## 2020-08-15 NOTE — Progress Notes (Signed)
Cardiologist - Dr Candee Furbish PCP/Internal Med - Dr Alain Marion  Chest x-ray - 07/26/20 (1V) EKG - 07/26/20 Stress Test - 08/04/14 ECHO - 03/13/20 Cardiac Cath - n/a  Sleep Study -  Yes CPAP - Does not use cpap  DM type 2 - no meds, does not check blood sugar.  Anesthesia review: Yes  STOP now taking any Aspirin (unless otherwise instructed by your surgeon), Aleve, Naproxen, Ibuprofen, Motrin, Advil, Goody's, BC's, all herbal medications, fish oil, and all vitamins.   Coronavirus Screening  Patient had positive covid results on 07/25/20.   Do you have any of the following symptoms:  Cough yes/no: No Fever (>100.77F)  yes/no: No Runny nose yes/no: No Sore throat yes/no: No Difficulty breathing/shortness of breath  yes/no: No  Have you traveled in the last 14 days and where? yes/no: No  Patient verbalized understanding of instructions that were given via phone.

## 2020-08-16 ENCOUNTER — Ambulatory Visit (HOSPITAL_COMMUNITY): Payer: Medicare Other | Admitting: Physician Assistant

## 2020-08-16 ENCOUNTER — Encounter (HOSPITAL_COMMUNITY)
Admission: RE | Disposition: A | Discharge status: Home / Self Care | Payer: Self-pay | Source: Home / Self Care | Attending: Gastroenterology

## 2020-08-16 ENCOUNTER — Ambulatory Visit (HOSPITAL_COMMUNITY)
Admission: RE | Admit: 2020-08-16 | Discharge: 2020-08-16 | Disposition: A | Discharge status: Home / Self Care | Payer: Medicare Other | Attending: Gastroenterology | Admitting: Gastroenterology

## 2020-08-16 ENCOUNTER — Other Ambulatory Visit: Payer: Self-pay

## 2020-08-16 ENCOUNTER — Encounter (HOSPITAL_COMMUNITY): Payer: Self-pay | Admitting: Gastroenterology

## 2020-08-16 DIAGNOSIS — K259 Gastric ulcer, unspecified as acute or chronic, without hemorrhage or perforation: Secondary | ICD-10-CM | POA: Diagnosis not present

## 2020-08-16 DIAGNOSIS — Z886 Allergy status to analgesic agent status: Secondary | ICD-10-CM | POA: Insufficient documentation

## 2020-08-16 DIAGNOSIS — K862 Cyst of pancreas: Secondary | ICD-10-CM | POA: Insufficient documentation

## 2020-08-16 DIAGNOSIS — K2970 Gastritis, unspecified, without bleeding: Secondary | ICD-10-CM

## 2020-08-16 DIAGNOSIS — K2980 Duodenitis without bleeding: Secondary | ICD-10-CM | POA: Insufficient documentation

## 2020-08-16 DIAGNOSIS — K2289 Other specified disease of esophagus: Secondary | ICD-10-CM | POA: Diagnosis not present

## 2020-08-16 DIAGNOSIS — Z885 Allergy status to narcotic agent status: Secondary | ICD-10-CM | POA: Diagnosis not present

## 2020-08-16 DIAGNOSIS — K2990 Gastroduodenitis, unspecified, without bleeding: Secondary | ICD-10-CM | POA: Diagnosis not present

## 2020-08-16 DIAGNOSIS — B9681 Helicobacter pylori [H. pylori] as the cause of diseases classified elsewhere: Secondary | ICD-10-CM | POA: Insufficient documentation

## 2020-08-16 DIAGNOSIS — Z888 Allergy status to other drugs, medicaments and biological substances status: Secondary | ICD-10-CM | POA: Diagnosis not present

## 2020-08-16 DIAGNOSIS — C252 Malignant neoplasm of tail of pancreas: Secondary | ICD-10-CM | POA: Insufficient documentation

## 2020-08-16 DIAGNOSIS — K8689 Other specified diseases of pancreas: Secondary | ICD-10-CM | POA: Diagnosis not present

## 2020-08-16 DIAGNOSIS — K2950 Unspecified chronic gastritis without bleeding: Secondary | ICD-10-CM | POA: Diagnosis not present

## 2020-08-16 DIAGNOSIS — R933 Abnormal findings on diagnostic imaging of other parts of digestive tract: Secondary | ICD-10-CM | POA: Diagnosis present

## 2020-08-16 DIAGNOSIS — K449 Diaphragmatic hernia without obstruction or gangrene: Secondary | ICD-10-CM | POA: Diagnosis not present

## 2020-08-16 HISTORY — PX: FINE NEEDLE ASPIRATION: SHX5430

## 2020-08-16 HISTORY — PX: BIOPSY: SHX5522

## 2020-08-16 HISTORY — PX: EUS: SHX5427

## 2020-08-16 HISTORY — PX: ESOPHAGOGASTRODUODENOSCOPY (EGD) WITH PROPOFOL: SHX5813

## 2020-08-16 SURGERY — UPPER ENDOSCOPIC ULTRASOUND (EUS) RADIAL
Anesthesia: Monitor Anesthesia Care

## 2020-08-16 MED ORDER — PROPOFOL 10 MG/ML IV BOLUS
INTRAVENOUS | Status: DC | PRN
  Administered 2020-08-16: 15 mg via INTRAVENOUS

## 2020-08-16 MED ORDER — PROPOFOL 500 MG/50ML IV EMUL
INTRAVENOUS | Status: DC | PRN
  Administered 2020-08-16: 100 ug/kg/min via INTRAVENOUS

## 2020-08-16 MED ORDER — FENTANYL CITRATE (PF) 100 MCG/2ML IJ SOLN
INTRAMUSCULAR | Status: DC | PRN
  Administered 2020-08-16: 25 ug via INTRAVENOUS

## 2020-08-16 MED ORDER — OMEPRAZOLE 40 MG PO CPDR: 40.0000 mg | DELAYED_RELEASE_CAPSULE | Freq: Two times a day (BID) | ORAL | 6 refills | Status: DC

## 2020-08-16 MED ORDER — LACTATED RINGERS IV SOLN: INTRAVENOUS | Status: DC

## 2020-08-16 MED ORDER — LIDOCAINE 2% (20 MG/ML) 5 ML SYRINGE
INTRAMUSCULAR | Status: DC | PRN
  Administered 2020-08-16: 80 mg via INTRAVENOUS

## 2020-08-16 NOTE — Anesthesia Procedure Notes (Signed)
Procedure Name: MAC Date/Time: 08/16/2020 7:45 AM Performed by: Imagene Riches, CRNA Pre-anesthesia Checklist: Patient identified, Emergency Drugs available, Suction available, Patient being monitored and Timeout performed Patient Re-evaluated:Patient Re-evaluated prior to induction Oxygen Delivery Method: Nasal cannula

## 2020-08-16 NOTE — Transfer of Care (Signed)
Immediate Anesthesia Transfer of Care Note  Patient: Amanda Arnold  Procedure(s) Performed: UPPER ENDOSCOPIC ULTRASOUND (EUS) RADIAL (N/A ) BIOPSY FINE NEEDLE ASPIRATION (FNA) LINEAR  Patient Location: PACU  Anesthesia Type:MAC  Level of Consciousness: drowsy  Airway & Oxygen Therapy: Patient Spontanous Breathing and Patient connected to nasal cannula oxygen  Post-op Assessment: Report given to RN and Post -op Vital signs reviewed and stable  Post vital signs: Reviewed and stable  Last Vitals:  Vitals Value Taken Time  BP 114/44 08/16/20 0836  Temp 36.6 C 08/16/20 0836  Pulse 66 08/16/20 0838  Resp 16 08/16/20 0838  SpO2 98 % 08/16/20 0838  Vitals shown include unvalidated device data.  Last Pain:  Vitals:   08/16/20 0836  TempSrc: Axillary  PainSc: 0-No pain         Complications: No complications documented.

## 2020-08-16 NOTE — H&P (Signed)
GASTROENTEROLOGY PROCEDURE H&P NOTE   Primary Care Physician: Cassandria Anger, MD  HPI: Amanda Arnold is a 75 y.o. female who presents for EGD/EUS of pancreatic tail mass.  Past Medical History:  Diagnosis Date  . Abdominal pain, epigastric 12/18/2009  . ANXIETY 05/25/2006  . BRONCHITIS, ACUTE 12/08/2007  . COPD 05/25/2006  . DEPRESSION 01/02/2006  . Diabetes mellitus without complication ([place])    type 2 - diet controlled - no meds  . ESOTROPIA, LEFT EYE 07-01[birth year]48  . FIBROMYALGIA 04/02/2006  . Headache(784.0) 12/08/2007  . HYPERLIPIDEMIA 04/30/2006  . HYPERTENSION 05/25/2006  . Hypertension   . INSOMNIA, PERSISTENT 02/22/2008  . Lazy eye    left - no surgery  . LOW BACK PAIN 01/27/2007  . Migraines   . 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 OBSTRUCTIVE SLEEP APNEA 04/17/2010   does not use CPAP  . OSTEOARTHRITIS 05/25/2006  . Polyuria   . Pre-diabetes    diet controlled  . Psychosomatic disease 2011  . 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 T M J 05/11/2009   Past Surgical History:  Procedure Laterality Date  . CHOLECYSTECTOMY    . COLONOSCOPY    . EYE SURGERY Bilateral    cataracts removed  . MASS EXCISION Left 10/06/2016   Procedure: EXCISION LEFT BUTTOCKS MASS;  Surgeon: Alphonsa Overall, MD;  Location: [facility];  Service: General;  Laterality: Left;   Current Facility-Administered Medications  Medication Dose Route Frequency Provider Last Rate Last Admin  . lactated ringers infusion   Intravenous Continuous Mansouraty, Telford Nab., MD       Allergies  Allergen Reactions  . Morphine And Related Shortness Of Breath  . Bupropion Hcl Other (See Comments)    "does not feel right"  . Ezetimibe Other (See Comments)    REACTION: legs burning  . Metformin And Related Diarrhea    Diarrhea w/XR or regular   . Naproxen     Upset stomach, diarrhea  . Trazodone Hcl Swelling  . Tramadol Hcl Palpitations    REACTION: palpitations   Family History   Problem Relation Age of Onset  . Heart attack Father   . Hypertension Brother   . Heart disease Mother   . Colon cancer Neg Hx   . Stomach cancer Neg Hx    Social History   Socioeconomic History  . Marital status: Married    Spouse name: Not on file  . Number of children: 5  . Years of education: Not on file  . Highest education level: Not on file  Occupational History  . Occupation: house wife    Employer: UNEMPLOYED  Tobacco Use  . Smoking status: Current Some Day Smoker    Types: Cigarettes    Last attempt to quit: 07/10/2013    Years since quitting: 7.1  . Smokeless tobacco: Current User    Types: Snuff  . Tobacco comment: smokes 2-4 cigs, maybe more when anxious - patient states quit smoking 2021  Vaping Use  . Vaping Use: Never used  Substance and Sexual Activity  . Alcohol use: No    Alcohol/week: 0.0 standard drinks  . Drug use: No  . Sexual activity: Not Currently    Birth control/protection: Post-menopausal  Other Topics Concern  . Not on file  Social History Narrative  . Not on file   Social Determinants of Health   Financial Resource Strain: Not on file  Food Insecurity: Not on file  Transportation Needs: Not on file  Physical Activity: Not on  file  Stress: Not on file  Social Connections: Not on file  Intimate Partner Violence: Not on file    Physical Exam: Vital signs in last 24 hours: Temp:  [98 F (36.7 C)] 98 F (36.7 C) (02/03 0652) Pulse Rate:  [66] 66 (02/03 0652) Resp:  [24] 24 (02/03 0652) BP: (140)/(53) 140/53 (02/03 0652) SpO2:  [97 %] 97 % (02/03 0652) Weight:  [83.1 kg] 83.1 kg (02/02 0956)   GEN: NAD EYE: Sclerae anicteric ENT: MMM CV: Non-tachycardic GI: Soft, NT/ND NEURO:  Alert & Oriented x 3  Lab Results: No results for input(s): WBC, HGB, HCT, PLT in the last 72 hours. BMET No results for input(s): NA, K, CL, CO2, GLUCOSE, BUN, CREATININE, CALCIUM in the last 72 hours. LFT No results for input(s): PROT,  ALBUMIN, AST, ALT, ALKPHOS, BILITOT, BILIDIR, IBILI in the last 72 hours. PT/INR No results for input(s): LABPROT, INR in the last 72 hours.   Impression / Plan: This is a 75 y.o.female who presents for EGD/EUS of pancreatic tail mass.  The risks of an EUS including intestinal perforation, bleeding, infection, aspiration, and medication effects were discussed as was the possibility it may not give a definitive diagnosis if a biopsy is performed.  When a biopsy of the pancreas is done as part of the EUS, there is an additional risk of pancreatitis at the rate of about 1-2%.  It was explained that procedure related pancreatitis is typically mild, although it can be severe and even life threatening, which is why we do not perform random pancreatic biopsies and only biopsy a lesion/area we feel is concerning enough to warrant the risk.  The risks and benefits of endoscopic evaluation were discussed with the patient; these include but are not limited to the risk of perforation, infection, bleeding, missed lesions, lack of diagnosis, severe illness requiring hospitalization, as well as anesthesia and sedation related illnesses.  The patient is agreeable to proceed.    Justice Britain, MD [facility] Office # [phone]

## 2020-08-16 NOTE — Op Note (Signed)
[facility] Patient Name: Amanda Arnold Procedure Date : 08/16/2020 MRN: [id number] Attending MD: Justice Britain , MD Date of Birth: 1946-03-21 CSN: [id number] Age: 75 Admit Type: Outpatient Procedure:                Upper EUS Indications:              Suspected mass in pancreas on CT scan, Suspected                            mass in pancreas on MRCP, Weight loss Providers:                Justice Britain, MD, Jeanella Cara, RN,                            Laverda Sorenson, Technician Referring MD:             Docia Chuck. Henrene Pastor, MD Medicines:                Monitored Anesthesia Care Complications:            No immediate complications. Estimated Blood Loss:     Estimated blood loss was minimal. Procedure:                Pre-Anesthesia Assessment:                           - Prior to the procedure, a History and Physical                            was performed, and patient medications and                            allergies were reviewed. The patient's tolerance of                            previous anesthesia was also reviewed. The risks                            and benefits of the procedure and the sedation                            options and risks were discussed with the patient.                            All questions were answered, and informed consent                            was obtained. Prior Anticoagulants: The patient has                            taken no previous anticoagulant or antiplatelet                            agents except for NSAID medication. ASA Grade  Assessment: III - A patient with severe systemic                            disease. After reviewing the risks and benefits,                            the patient was deemed in satisfactory condition to                            undergo the procedure.                           After obtaining informed consent, the endoscope was                             passed under direct vision. Throughout the                            procedure, the patient's blood pressure, pulse, and                            oxygen saturations were monitored continuously. The                            GIF-H190 ([id number]) Olympus gastroscope was                            introduced through the mouth, and advanced to the                            second part of duodenum. The [id number]-Q180V ([id number])                            Olympus Duodenoscope was introduced through the                            mouth, and advanced to the second part of duodenum.                            The GF-UCT180 ([id number]) Olympus Linear EUS scope                            was introduced through the mouth, and advanced to                            the duodenum for ultrasound examination from the                            stomach and duodenum. The upper EUS was                            accomplished without difficulty. The patient  tolerated the procedure. Scope In: Scope Out: Findings:      ENDOSCOPIC FINDING: :      No gross lesions were noted in the entire esophagus.      The Z-line was irregular and was found 38 cm from the incisors.      A 2 cm hiatal hernia was present.      Patchy moderate inflammation characterized by erythema, friability and       granularity was found in the entire examined stomach.      One non-bleeding cratered gastric ulcer with a clean ulcer base (Forrest       Class III) was found in the prepyloric region of the stomach. The lesion       was 10 mm in largest dimension.      No other gross lesions were noted in the entire examined stomach.       Biopsies were taken with a cold forceps for histology and Helicobacter       pylori testing.      Localized moderate inflammation characterized by erosions, erythema and       friability was found in the duodenal bulb.      No gross lesions were noted in the second portion of the  duodenum.      The major papilla was normal.      ENDOSONOGRAPHIC FINDING: :      An irregular mass was identified in the pancreatic tail. The mass was       hypoechoic. The mass measured 35 mm by 22 mm in maximal cross-sectional       diameter. The endosonographic borders were well-defined. There was       sonographic evidence suggesting invasion into the splenic artery       (manifested by invasion). An intact interface was seen between the mass       and the superior mesenteric artery and celiac trunk suggesting a lack of       invasion. The remainder of the pancreas was examined. The       endosonographic appearance of parenchyma and the upstream pancreatic       duct indicated parenchymal atrophy. Fine needle biopsy was performed.       Color Doppler imaging was utilized prior to needle puncture to confirm a       lack of significant vascular structures within the needle path. Six       passes were made with the Acquire 22 gauge ultrasound core biopsy needle       using a transgastric approach. Visible cores of tissue were obtained.       Preliminary cytologic examination and touch preps were performed. Final       cytology results are pending.      A pancreatic cyst was noted in the tail of the pancreas and was sub 1 cm       in size.      The pancreatic duct was visualized pancreatic head (1.4 mm -> 1.8 mm),       genu of the pancreas (1.2 mm), body of the pancreas (2.0 mm) and tail of       the pancreas (1.3 mm).      There was no sign of significant endosonographic abnormality in the       common bile duct (3.0 mm -> 3.4 mm) and in the common hepatic duct (5.3       mm). No stones, no biliary sludge and ducts of normal  caliber were       identified.      Endosonographic imaging of the ampulla showed no extrinsic compression,       intramural (subepithelial) lesion, mass, varices or wall thickening.      No malignant-appearing lymph nodes were visualized in the celiac region        (level 20), peripancreatic region and porta hepatis region.      Endosonographic imaging in the visualized portion of the liver showed no       mass.      The celiac region was visualized. Impression:               EGD Impression:                           - No gross lesions in esophagus. Z-line irregular,                            38 cm from the incisors.                           - 2 cm hiatal hernia.                           - Gastritis.                           - Non-bleeding gastric ulcer with a clean ulcer                            base (Forrest Class III).                           - Biopsied stomach for HP.                           - Duodenitis in bulb.                           - No gross lesions in the second portion of the                            duodenum.                           - Normal major papilla.                           EUS Impression:                           - A mass was identified in the pancreatic tail.                            Cytology results are pending. However, the                            endosonographic appearance is highly suspicious for  adenocarcinoma. This was staged T2 N0 Mx by                            endosonographic criteria. The staging applies if                            malignancy is confirmed. Fine needle biopsy                            performed.                           - Sub 1 cm pancreatic cyst in tail noted.                           - The pancreatic duct was visualized in the                            pancreatic head, genu of the pancreas, body of the                            pancreas and tail of the pancreas.                           - There was no sign of significant pathology in the                            common bile duct and in the common hepatic duct.                           - No malignant-appearing lymph nodes were                            visualized in the celiac region  (level 20),                            peripancreatic region and porta hepatis region. Recommendation:           - The patient will be observed post-procedure,                            until all discharge criteria are met.                           - Discharge patient to home.                           - Patient has a contact number available for                            emergencies. The signs and symptoms of potential                            delayed complications were discussed with the  patient. Return to normal activities tomorrow.                            Written discharge instructions were provided to the                            patient.                           - Low fat diet for 1 week.                           - Monitor for signs/symptoms of bleeding,                            perforation, and infection. If issues please call                            our number to get further assistance as needed.                           - Await cytology results and await path results.                           - Start Omeprazole 40 mg twice daily.                           - Minimize NSAID use as able for next 2-weeks.                           - Consider repeat EGD in 3-4 months to check on                            healing.                           - The findings and recommendations were discussed                            with the patient.                           - The findings and recommendations were discussed                            with the patient's family. Procedure Code(s):        --- Professional ---                           [phone], Esophagogastroduodenoscopy, flexible,                            transoral; with transendoscopic ultrasound-guided                            intramural or transmural fine needle  aspiration/biopsy(s), (includes endoscopic                            ultrasound examination  limited to the esophagus,                            stomach or duodenum, and adjacent structures) Diagnosis Code(s):        --- Professional ---                           K22.8, Other specified diseases of esophagus                           K44.9, Diaphragmatic hernia without obstruction or                            gangrene                           K29.70, Gastritis, unspecified, without bleeding                           K25.9, Gastric ulcer, unspecified as acute or                            chronic, without hemorrhage or perforation                           K29.80, Duodenitis without bleeding                           K86.89, Other specified diseases of pancreas                           R93.3, Abnormal findings on diagnostic imaging of                            other parts of digestive tract                           I89.9, Noninfective disorder of lymphatic vessels                            and lymph nodes, unspecified                           R63.4, Abnormal weight loss CPT copyright 2019 [employer]. All rights reserved. The codes documented in this report are preliminary and upon coder review may  be revised to meet current compliance requirements. Justice Britain, MD 08/16/2020 8:59:31 AM Number of Addenda: 0

## 2020-08-16 NOTE — Anesthesia Preprocedure Evaluation (Signed)
Anesthesia Evaluation  Patient identified by MRN, date of birth, ID band Patient awake    Reviewed: Allergy & Precautions, NPO status , Patient's Chart, lab work & pertinent test results, reviewed documented beta blocker date and time   History of Anesthesia Complications Negative for: history of anesthetic complications  Airway Mallampati: II  TM Distance: >3 FB Neck ROM: Full    Dental   Pulmonary sleep apnea , COPD, Current Smoker,    Pulmonary exam normal        Cardiovascular hypertension, Pt. on medications and Pt. on home beta blockers Normal cardiovascular exam     Neuro/Psych  Headaches, Anxiety Depression    GI/Hepatic Neg liver ROS, Pancreatic mass   Endo/Other  diabetes, Type 2  Renal/GU negative Renal ROS  negative genitourinary   Musculoskeletal  (+) Fibromyalgia -  Abdominal   Peds  Hematology negative hematology ROS (+)   Anesthesia Other Findings   Reproductive/Obstetrics                             Anesthesia Physical Anesthesia Plan  ASA: III  Anesthesia Plan: MAC   Post-op Pain Management:    Induction: Intravenous  PONV Risk Score and Plan: 2 and Propofol infusion, TIVA and Treatment may vary due to age or medical condition  Airway Management Planned: Natural Airway, Nasal Cannula and Simple Face Mask  Additional Equipment: None  Intra-op Plan:   Post-operative Plan:   Informed Consent: I have reviewed the patients History and Physical, chart, labs and discussed the procedure including the risks, benefits and alternatives for the proposed anesthesia with the patient or authorized representative who has indicated his/her understanding and acceptance.       Plan Discussed with:   Anesthesia Plan Comments:         Anesthesia Quick Evaluation

## 2020-08-16 NOTE — Anesthesia Postprocedure Evaluation (Signed)
Anesthesia Post Note  Patient: CAIT LOCUST  Procedure(s) Performed: UPPER ENDOSCOPIC ULTRASOUND (EUS) RADIAL (N/A ) BIOPSY FINE NEEDLE ASPIRATION (FNA) LINEAR     Patient location during evaluation: Endoscopy Anesthesia Type: MAC Level of consciousness: awake and alert Pain management: pain level controlled Vital Signs Assessment: post-procedure vital signs reviewed and stable Respiratory status: spontaneous breathing, nonlabored ventilation and respiratory function stable Cardiovascular status: blood pressure returned to baseline and stable Postop Assessment: no apparent nausea or vomiting Anesthetic complications: no   No complications documented.  Last Vitals:  Vitals:   08/16/20 0856 08/16/20 0906  BP: (!) 136/58 (!) 142/61  Pulse: (!) 59 60  Resp: 17 12  Temp:    SpO2: 98% 97%    Last Pain:  Vitals:   08/16/20 0856  TempSrc:   PainSc: 0-No pain                 Lidia Collum

## 2020-08-16 NOTE — Discharge Instructions (Signed)
YOU HAD AN ENDOSCOPIC PROCEDURE TODAY: Refer to the procedure report and other information in the discharge instructions given to you for any specific questions about what was found during the examination. If this information does not answer your questions, please call [facility] office at [phone] to clarify.   YOU SHOULD EXPECT: Some feelings of bloating in the abdomen. Passage of more gas than usual. Walking can help get rid of the air that was put into your GI tract during the procedure and reduce the bloating. If you had a lower endoscopy (such as a colonoscopy or flexible sigmoidoscopy) you may notice spotting of blood in your stool or on the toilet paper. Some abdominal soreness may be present for a day or two, also.  DIET: Your first meal following the procedure should be a light meal and then it is ok to progress to your normal diet. A half-sandwich or bowl of soup is an example of a good first meal. Heavy or fried foods are harder to digest and may make you feel nauseous or bloated. Drink plenty of fluids but you should avoid alcoholic beverages for 24 hours. If you had a esophageal dilation, please see attached instructions for diet.    ACTIVITY: Your care partner should take you home directly after the procedure. You should plan to take it easy, moving slowly for the rest of the day. You can resume normal activity the day after the procedure however YOU SHOULD NOT DRIVE, use power tools, machinery or perform tasks that involve climbing or major physical exertion for 24 hours (because of the sedation medicines used during the test).   SYMPTOMS TO REPORT IMMEDIATELY: A gastroenterologist can be reached at any hour. Please call [phone]  for any of the following symptoms:   Following upper endoscopy (EGD, EUS, ERCP, esophageal dilation) Vomiting of blood or coffee ground material  New, significant abdominal pain  New, significant chest pain or pain under the shoulder blades  Painful or  persistently difficult swallowing  New shortness of breath  Black, tarry-looking or red, bloody stools  FOLLOW UP:  If any biopsies were taken you will be contacted by phone or by letter within the next 1-3 weeks. Call [phone]  if you have not heard about the biopsies in 3 weeks.  Please also call with any specific questions about appointments or follow up tests.  

## 2020-08-17 LAB — SURGICAL PATHOLOGY

## 2020-08-18 ENCOUNTER — Encounter: Payer: Self-pay | Admitting: Gastroenterology

## 2020-08-20 ENCOUNTER — Telehealth: Payer: Self-pay | Admitting: Gastroenterology

## 2020-08-20 ENCOUNTER — Other Ambulatory Visit: Payer: Self-pay

## 2020-08-20 LAB — CYTOLOGY - NON PAP

## 2020-08-20 MED ORDER — METRONIDAZOLE 250 MG PO TABS: 250.0000 mg | ORAL_TABLET | Freq: Four times a day (QID) | ORAL | 0 refills | Status: DC

## 2020-08-20 MED ORDER — BISMUTH 262 MG PO CHEW: 524.0000 mg | CHEWABLE_TABLET | Freq: Three times a day (TID) | ORAL | 0 refills | Status: DC

## 2020-08-20 MED ORDER — BIS SUBCIT-METRONID-TETRACYC 140-125-125 MG PO CAPS: 3.0000 | ORAL_CAPSULE | Freq: Three times a day (TID) | ORAL | 0 refills | Status: DC

## 2020-08-20 MED ORDER — TETRACYCLINE HCL 500 MG PO CAPS: 500.0000 mg | ORAL_CAPSULE | Freq: Four times a day (QID) | ORAL | 0 refills | Status: DC

## 2020-08-20 NOTE — Telephone Encounter (Signed)
Pylera broken down in to components for pt. Scripts sent to pharmacy. Pt aware.

## 2020-08-20 NOTE — Telephone Encounter (Signed)
Pt states that Pylera is over $1,000. She would like something more affordable. Pls call her.

## 2020-08-21 ENCOUNTER — Telehealth: Payer: Self-pay | Admitting: Oncology

## 2020-08-21 ENCOUNTER — Other Ambulatory Visit: Payer: Self-pay

## 2020-08-21 DIAGNOSIS — C259 Malignant neoplasm of pancreas, unspecified: Secondary | ICD-10-CM

## 2020-08-21 NOTE — Telephone Encounter (Signed)
Received a new pt referral from Dr. Henrene Pastor for pancreatic cancer. Amanda Arnold has been cld and scheduled to see Dr. Benay Spice on 2/15 at 2pm. Pt aware to arrive 20 minutes early.

## 2020-08-22 ENCOUNTER — Other Ambulatory Visit (INDEPENDENT_AMBULATORY_CARE_PROVIDER_SITE_OTHER): Payer: Medicare Other

## 2020-08-22 DIAGNOSIS — C259 Malignant neoplasm of pancreas, unspecified: Secondary | ICD-10-CM

## 2020-08-22 LAB — CBC WITH DIFFERENTIAL/PLATELET
Basophils Absolute: 0 10*3/uL (ref 0.0–0.1)
Basophils Relative: 0.8 % (ref 0.0–3.0)
Eosinophils Absolute: 0.1 10*3/uL (ref 0.0–0.7)
Eosinophils Relative: 1.4 % (ref 0.0–5.0)
HCT: 37.3 % (ref 36.0–46.0)
Hemoglobin: 12.5 g/dL (ref 12.0–15.0)
Lymphocytes Relative: 25.4 % (ref 12.0–46.0)
Lymphs Abs: 1.2 10*3/uL (ref 0.7–4.0)
MCHC: 33.5 g/dL (ref 30.0–36.0)
MCV: 91.9 fl (ref 78.0–100.0)
Monocytes Absolute: 0.3 10*3/uL (ref 0.1–1.0)
Monocytes Relative: 6.8 % (ref 3.0–12.0)
Neutro Abs: 3 10*3/uL (ref 1.4–7.7)
Neutrophils Relative %: 65.6 % (ref 43.0–77.0)
Platelets: 135 10*3/uL — ABNORMAL LOW (ref 150.0–400.0)
RBC: 4.06 Mil/uL (ref 3.87–5.11)
RDW: 13.9 % (ref 11.5–15.5)
WBC: 4.5 10*3/uL (ref 4.0–10.5)

## 2020-08-22 LAB — COMPREHENSIVE METABOLIC PANEL
ALT: 11 U/L (ref 0–35)
AST: 19 U/L (ref 0–37)
Albumin: 3.8 g/dL (ref 3.5–5.2)
Alkaline Phosphatase: 51 U/L (ref 39–117)
BUN: 8 mg/dL (ref 6–23)
CO2: 26 mEq/L (ref 19–32)
Calcium: 8.9 mg/dL (ref 8.4–10.5)
Chloride: 104 mEq/L (ref 96–112)
Creatinine, Ser: 0.65 mg/dL (ref 0.40–1.20)
GFR: 88.02 mL/min (ref >60.00)
Glucose, Bld: 137 mg/dL — ABNORMAL HIGH (ref 70–99)
Potassium: 3.7 mEq/L (ref 3.5–5.1)
Sodium: 139 mEq/L (ref 135–145)
Total Bilirubin: 0.7 mg/dL (ref 0.2–1.2)
Total Protein: 6.3 g/dL (ref 6.0–8.3)

## 2020-08-22 LAB — PROTIME-INR
INR: 1.1 ratio — ABNORMAL HIGH (ref 0.8–1.0)
Prothrombin Time: 12.6 s (ref 9.6–13.1)

## 2020-08-23 ENCOUNTER — Telehealth: Payer: Self-pay | Admitting: Gastroenterology

## 2020-08-23 LAB — CANCER ANTIGEN 19-9: CA 19-9: 4 U/mL (ref <34)

## 2020-08-23 NOTE — Telephone Encounter (Signed)
Dr Rush Landmark is 2/16 ok for the [facility] appt?

## 2020-08-23 NOTE — Telephone Encounter (Signed)
Thank you for help.  That will work. GM

## 2020-08-23 NOTE — Telephone Encounter (Signed)
Called and spoke with Lattie Haw at [facility] and made her aware that the appt on 2/16 will be fine.  [facility] will call the pt

## 2020-08-23 NOTE — Telephone Encounter (Signed)
Lattie Haw from [facility] called regarding a referral sent to their office. Said the patient has other appointments for next week and they will not be able to get her in until 08/29/20 or so checking with to see if that is ok [phone]

## 2020-08-24 ENCOUNTER — Telehealth: Payer: Self-pay | Admitting: Gastroenterology

## 2020-08-24 NOTE — Telephone Encounter (Signed)
Inbound call from patient requesting a call back from a nurse please.  States she started getting hives on her body and she believes it's due to the medication we prescribed.  Please advise.

## 2020-08-24 NOTE — Telephone Encounter (Signed)
The pt has been advised to stop all abx for H pylori. She will continue PPI. I tried to set pt up for a follow up with Dr Henrene Pastor but she prefers to wait and call back to make that appt.  She will call PCP or derm if the rash does not improve.

## 2020-08-24 NOTE — Telephone Encounter (Signed)
Thanks for update. GM 

## 2020-08-24 NOTE — Telephone Encounter (Signed)
Extremely red raised, raw appearing rash in between the legs, on the abdomen and under the breast.  She states she has skin issues and uses vasoline and it gets better.  However, this time it is not getting better.  She is not sure when this particular rash began because she does have skin issues.  She does have a dermatolgist that she sees.  Does not itch.  Began taking metronidazole 4 times daily, tetracycline 4 times daily, bismuth 4 times daily for Pylera.  She is not sure if this is the cause of the rash.  She did not take last nights dose or this morning.  She wants to know if she should continue or try something different. She has triamcinolone cream from the dermatologist and has tried this but has not noticed any change.  Please advise

## 2020-08-24 NOTE — Telephone Encounter (Signed)
Amanda Arnold, the patient should stop all the medications other than the PPI.  This could be an allergic reaction to the Flagyl or one of the other medications especially if she has not had them previously.  For now continue PPI and then hopefully the rash will improve if it does not she needs to see her PCP or her dermatologist.  Dr. Henrene Pastor can decide in the coming months as to what treatment option could be offered depending on how she does.  However, it is important to just go ahead and stop the medications at this time other than her PPI. FYI JP. GM

## 2020-08-27 ENCOUNTER — Ambulatory Visit: Payer: Medicare Other | Admitting: Internal Medicine

## 2020-08-27 ENCOUNTER — Other Ambulatory Visit: Payer: Self-pay

## 2020-08-27 ENCOUNTER — Encounter (HOSPITAL_COMMUNITY): Payer: Self-pay

## 2020-08-27 ENCOUNTER — Ambulatory Visit (HOSPITAL_COMMUNITY)
Admission: RE | Admit: 2020-08-27 | Discharge: 2020-08-27 | Disposition: A | Discharge status: Home / Self Care | Payer: Medicare Other | Source: Ambulatory Visit | Attending: Internal Medicine | Admitting: Internal Medicine

## 2020-08-27 DIAGNOSIS — C259 Malignant neoplasm of pancreas, unspecified: Secondary | ICD-10-CM | POA: Diagnosis not present

## 2020-08-27 DIAGNOSIS — R911 Solitary pulmonary nodule: Secondary | ICD-10-CM | POA: Diagnosis not present

## 2020-08-27 DIAGNOSIS — K869 Disease of pancreas, unspecified: Secondary | ICD-10-CM | POA: Diagnosis not present

## 2020-08-27 DIAGNOSIS — K760 Fatty (change of) liver, not elsewhere classified: Secondary | ICD-10-CM | POA: Diagnosis not present

## 2020-08-27 MED ORDER — IOHEXOL 300 MG/ML  SOLN
100.0000 mL | Freq: Once | INTRAMUSCULAR | Status: AC | PRN
  Administered 2020-08-27: 100 mL via INTRAVENOUS

## 2020-08-28 ENCOUNTER — Telehealth: Payer: Self-pay | Admitting: Oncology

## 2020-08-28 ENCOUNTER — Inpatient Hospital Stay: Payer: Medicare Other | Attending: Oncology | Admitting: Oncology

## 2020-08-28 VITALS — BP 113/52 | HR 60 | Temp 97.4°F | Resp 17 | Ht 62.0 in | Wt 179.2 lb

## 2020-08-28 DIAGNOSIS — K8689 Other specified diseases of pancreas: Secondary | ICD-10-CM

## 2020-08-28 DIAGNOSIS — C252 Malignant neoplasm of tail of pancreas: Secondary | ICD-10-CM | POA: Insufficient documentation

## 2020-08-28 NOTE — Progress Notes (Signed)
[facility] New Patient Consult   Requesting MD: Cassandria Anger, Md [place],  [place] [address]   Amanda Arnold 75 y.o.  07/29/1945    Reason for Consult: Pancreas cancer   HPI: Amanda Arnold developed right abdominal discomfort in the fall 2021.  A CT of the abdomen and pelvis revealed a 2.9 x 3 cm pancreas tail mass.  A 7 mm right middle lobe nodule had increased slightly compared to a CT from July 2017.  No acute findings. An MRI of the abdomen on 06/22/2020 revealed a 3.8 x 3 x 3.1 cm pancreas tail mass.  Bilateral adrenal adenomas.  Renal cortical cyst.  The splenic vein is occluded by the pancreas tail mass with mildly dilated venous collaterals.  No pathologic enlarged lymph nodes. She was referred to Dr. Henrene Pastor on 07/24/2020.  He recommended an EUS.  She was taken to upper EUS by Dr. Rush Landmark on 08/16/2020.  A nonbleeding gastric ulcer was found in the prepyloric region measuring 10 mm.  Biopsies were taken for H. pylori testing and histology.  An irregular mass was noted in the pancreas tail measuring 35 x 22 mm.  There was evidence of invasion into the splenic artery.  No invasion of the SMA or celiac trunk.  A fine-needle biopsy was performed.  A 1 cm cyst was noted in the pancreas tail.  No malignant appearing lymph nodes.  The pancreas lesion was staged as a T2N0 tumor. The pathology from the pancreas FNA biopsy returned as suspicious for malignancy.  The stomach biopsy returned positive for H. pylori.  She was started on Prilosec and H. pylori therapy.  Right abdominal pain she experienced in the fall 2021 has improved.  She continues to have intermittent pain in the right abdomen and right upper leg.   Past Medical History:  Diagnosis Date  . Abdominal pain, epigastric 12/18/2009  . ANXIETY 05/25/2006  . BRONCHITIS, ACUTE 12/08/2007  . COPD 05/25/2006  . DEPRESSION 01/02/2006  . Diabetes mellitus without complication ([place])    type 2 -  diet controlled - no meds  . ESOTROPIA, LEFT EYE 1945-10-30  . FIBROMYALGIA 04/02/2006  . Headache(784.0) 12/08/2007  . HYPERLIPIDEMIA 04/30/2006  . HYPERTENSION 05/25/2006  . Hypertension   . INSOMNIA, PERSISTENT 02/22/2008  . Lazy eye    left - no surgery  . LOW BACK PAIN 01/27/2007  . Migraines   . 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 OBSTRUCTIVE SLEEP APNEA 04/17/2010   does not use CPAP  . OSTEOARTHRITIS 05/25/2006  . Polyuria   . Pre-diabetes    diet controlled  . Psychosomatic disease 2011  . 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  G7, P5, 2 miscarriages   .  Skin cancer removed above the left upper lip  Past Surgical History:  Procedure Laterality Date  . BIOPSY  08/16/2020   Procedure: BIOPSY;  Surgeon: Rush Landmark Telford Nab., MD;  Location: [facility];  Service: Endoscopy;;  . CHOLECYSTECTOMY    . COLONOSCOPY    . ESOPHAGOGASTRODUODENOSCOPY (EGD) WITH PROPOFOL N/A 08/16/2020   Procedure: ESOPHAGOGASTRODUODENOSCOPY (EGD) WITH PROPOFOL;  Surgeon: Rush Landmark Telford Nab., MD;  Location: [facility];  Service: Endoscopy;  Laterality: N/A;  . EUS N/A 08/16/2020   Procedure: UPPER ENDOSCOPIC ULTRASOUND (EUS) RADIAL;  Surgeon: Rush Landmark Telford Nab., MD;  Location: [facility];  Service: Endoscopy;  Laterality: N/A;  . EYE SURGERY Bilateral    cataracts removed  . FINE NEEDLE  ASPIRATION  08/16/2020   Procedure: FINE NEEDLE ASPIRATION (FNA) LINEAR;  Surgeon: Irving Copas., MD;  Location: [facility];  Service: Endoscopy;;  . MASS EXCISION Left 10/06/2016   Procedure: EXCISION LEFT BUTTOCKS MASS;  Surgeon: Alphonsa Overall, MD;  Location: [facility];  Service: General;  Laterality: Left;    Medications: Reviewed  Allergies:  Allergies  Allergen Reactions  . Morphine And Related Shortness Of Breath  . Bupropion Hcl Other (See Comments)    "does not feel right"  . Ezetimibe Other (See Comments)    REACTION: legs burning  . Metformin  And Related Diarrhea    Diarrhea w/XR or regular   . Naproxen     Upset stomach, diarrhea  . Trazodone Hcl Swelling  . Tramadol Hcl Palpitations    REACTION: palpitations    Family history: Her mother had cervical cancer.  A brother had acute leukemia.  Social History:   She lives with her husband and [place].  She is retired.  She previously worked in Sales executive.  She quit smoking cigarettes 6 months ago after smoking 1/2-1 pack of cigarettes per day for many years.  No alcohol use.  She was transfused with one of her pregnancies.  No risk factor for HIV or hepatitis.  ROS:   Positives include: Fever/cough/diarrhea/diffuse pain January 2022-diagnosed with COVID-19 infection with symptoms resolved, frequent urination, right shoulder "arthritis ", rash in the skin folds at the breast, stomach, and groin  A complete ROS was otherwise negative.  Physical Exam:  Blood pressure (!) 113/52, pulse 60, temperature (!) 97.4 F (36.3 C), temperature source Tympanic, resp. rate 17, height 5\' 2"  (1.575 m), weight 179 lb 3.2 oz (81.3 kg), SpO2 99 %.  HEENT: Neck without mass Lungs: Clear bilaterally Cardiac: Regular rate and rhythm Abdomen: No hepatosplenomegaly, no mass, nontender  Vascular: No leg edema Lymph nodes: No cervical, supraclavicular, axillary, or inguinal nodes Neurologic: Alert and oriented, the motor exam appears intact in the upper and lower extremities bilaterally Skin: Yeast rash beneath the breasts, at the lower abdominal pannus, and in the groin bilaterally Musculoskeletal: No spine tenderness   LAB:  CBC  Lab Results  Component Value Date   WBC 4.5 08/22/2020   HGB 12.5 08/22/2020   HCT 37.3 08/22/2020   MCV 91.9 08/22/2020   PLT 135.0 (L) 08/22/2020   NEUTROABS 3.0 08/22/2020        CMP  Lab Results  Component Value Date   NA 139 08/22/2020   K 3.7 08/22/2020   CL 104 08/22/2020   CO2 26 08/22/2020   GLUCOSE 137 (H) 08/22/2020   BUN 8  08/22/2020   CREATININE 0.65 08/22/2020   CALCIUM 8.9 08/22/2020   PROT 6.3 08/22/2020   ALBUMIN 3.8 08/22/2020   AST 19 08/22/2020   ALT 11 08/22/2020   ALKPHOS 51 08/22/2020   BILITOT 0.7 08/22/2020   GFRNONAA >60 08/01/2020   GFRAA 103 04/20/2020    CA 19-9 on 08/22/2020: 4  Imaging:  CT CHEST W CONTRAST  Result Date: 08/28/2020 CLINICAL DATA:  Follow-up pancreatic mass EXAM: CT CHEST, ABDOMEN, AND PELVIS WITH CONTRAST TECHNIQUE: Multidetector CT imaging of the chest, abdomen and pelvis was performed following the standard protocol during bolus administration of intravenous contrast. CONTRAST:  141mL OMNIPAQUE IOHEXOL 300 MG/ML SOLN, additional oral enteric contrast COMPARISON:  08/02/2020, MR abdomen, 06/22/2020, CT abdomen pelvis, 06/01/2020 FINDINGS: CT CHEST FINDINGS Cardiovascular: Aortic atherosclerosis. Normal heart size. No pericardial effusion. Mediastinum/Nodes: No enlarged mediastinal, hilar, or  axillary lymph nodes. Thyroid gland, trachea, and esophagus demonstrate no significant findings. Lungs/Pleura: Unchanged 6 mm pulmonary nodule of the right middle lobe (series 10, image 89). Mild, predominantly dependent bibasilar scarring of the lungs. No pleural effusion or pneumothorax. Musculoskeletal: No suspicious bone lesions identified. Superficial subcutaneous lesion of the posterior left back at the level of the scapula, measuring 2.5 x 1.5 cm (series 7, image 18). CT ABDOMEN PELVIS FINDINGS Hepatobiliary: No focal liver abnormality is seen. Hepatic steatosis. Status post cholecystectomy. No biliary dilatation. Pancreas: No significant change in a hypodense mass of the tip of the pancreatic tail measuring 3.2 x 3.0 cm no pancreatic ductal dilatation or surrounding inflammatory changes. Spleen: Normal in size without significant abnormality. Adrenals/Urinary Tract: Fat containing, definitively benign bilateral adrenal adenomata. Small nonobstructive calculus of the inferior pole of the  right kidney (series 7, image 113). Bladder is unremarkable. Stomach/Bowel: Stomach is within normal limits. Appendix appears normal. No evidence of bowel wall thickening, distention, or inflammatory changes. Vascular/Lymphatic: Aortic atherosclerosis. 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 No enlarged abdominal or pelvic lymph nodes. Reproductive: Calcified uterine fibroids. Other: No abdominal wall hernia or abnormality. No abdominopelvic ascites. Musculoskeletal: No acute or significant osseous findings. IMPRESSION: 1. No significant change in a hypodense mass of the tip of the pancreatic tail measuring 3.2 x 3.0 cm. This remains suspicious for pancreatic adenocarcinoma. 2. 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 3. Unchanged 6 mm pulmonary nodule of the right middle lobe, nonspecific; most likely incidental, benign sequelae of infection or inflammation, isolated manifestation of metastatic disease not favored but difficult to strictly exclude. Attention on follow-up. 4. Small nonobstructive calculus of the inferior pole of the right kidney. 5. Hepatic steatosis. 6. Superficial subcutaneous lesion of the posterior left back at the level of the scapula, measuring 2.5 x 1.5 cm, likely a sebaceous or cutaneous inclusion cyst. Correlate with physical exam. Aortic Atherosclerosis (ICD10-I70.0). Electronically Signed   By: Eddie Candle M.D.   On: 08/28/2020 08:59   CT Abdomen Pelvis W Contrast  Result Date: 08/28/2020 CLINICAL DATA:  Follow-up pancreatic mass EXAM: CT CHEST, ABDOMEN, AND PELVIS WITH CONTRAST TECHNIQUE: Multidetector CT imaging of the chest, abdomen and pelvis was performed following the standard protocol during bolus administration of  intravenous contrast. CONTRAST:  117mL OMNIPAQUE IOHEXOL 300 MG/ML SOLN, additional oral enteric contrast COMPARISON:  08/02/2020, MR abdomen, 06/22/2020, CT abdomen pelvis, 06/01/2020 FINDINGS: CT CHEST FINDINGS Cardiovascular: Aortic atherosclerosis. Normal heart size. No pericardial effusion. Mediastinum/Nodes: No enlarged mediastinal, hilar, or axillary lymph nodes. Thyroid gland, trachea, and esophagus demonstrate no significant findings. Lungs/Pleura: Unchanged 6 mm pulmonary nodule of the right middle lobe (series 10, image 89). Mild, predominantly dependent bibasilar scarring of the lungs. No pleural effusion or pneumothorax. Musculoskeletal: No suspicious bone lesions identified. Superficial subcutaneous lesion of the posterior left back at the level of the scapula, measuring 2.5 x 1.5 cm (series 7, image 18). CT ABDOMEN PELVIS FINDINGS Hepatobiliary: No focal liver abnormality is seen. Hepatic steatosis. Status post cholecystectomy. No biliary dilatation. Pancreas: No significant change in a hypodense mass of the tip of the pancreatic tail measuring 3.2 x 3.0 cm no pancreatic ductal dilatation or surrounding inflammatory changes. Spleen: Normal in size without significant abnormality. Adrenals/Urinary Tract: Fat containing, definitively benign bilateral adrenal adenomata.  Small nonobstructive calculus of the inferior pole of the right kidney (series 7, image 113). Bladder is unremarkable. Stomach/Bowel: Stomach is within normal limits. Appendix appears normal. No evidence of bowel wall thickening, distention, or inflammatory changes. Vascular/Lymphatic: Aortic atherosclerosis. 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 No enlarged abdominal or pelvic lymph nodes. Reproductive: Calcified uterine fibroids. Other: No abdominal wall hernia or abnormality. No abdominopelvic ascites.  Musculoskeletal: No acute or significant osseous findings. IMPRESSION: 1. No significant change in a hypodense mass of the tip of the pancreatic tail measuring 3.2 x 3.0 cm. This remains suspicious for pancreatic adenocarcinoma. 2. 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 3. Unchanged 6 mm pulmonary nodule of the right middle lobe, nonspecific; most likely incidental, benign sequelae of infection or inflammation, isolated manifestation of metastatic disease not favored but difficult to strictly exclude. Attention on follow-up. 4. Small nonobstructive calculus of the inferior pole of the right kidney. 5. Hepatic steatosis. 6. Superficial subcutaneous lesion of the posterior left back at the level of the scapula, measuring 2.5 x 1.5 cm, likely a sebaceous or cutaneous inclusion cyst. Correlate with physical exam. Aortic Atherosclerosis (ICD10-I70.0). Electronically Signed   By: Eddie Candle M.D.   On: 08/28/2020 08:59   CT images reviewed with Ms. Brim and her daughter   Assessment/Plan:   1. Pancreas cancer  CT abdomen/pelvis 06/01/2020-2.9 x 3 cm pancreas tail mass, slight increase in size of 7 mm right middle lobe nodule  MRI/MRCP 06/22/2020-solid 3.8 x 3 x 3.1 cm pancreas tail mass with focal occlusion of the splenic vein and dilated venous collaterals, no evidence of metastatic disease, bilateral adrenal adenomas  CT renal stone study 08/02/2020-scattered airspace opacities throughout the lung bases-new favored to represent infectious/inflammatory changes  EUS 08/16/2020-pancreas tail mass, T2N0, FNA biopsy-suspicious for malignancy  CTs 08/27/2020-unchanged right middle lobe nodule, superficial left upper back lesion, no change in pancreas tail mass, occluded splenic vein with collaterals at the splenic hilum, no enlarged abdominal pelvic lymph nodes 2.   Gastric ulcer  on EUS 08/16/2020-H. pylori positive 3.   COVID-19 infection January 2022 4.   COPD 5.   Hypertension 6.   Right abdomen/right leg pain-musculoskeletal? 7.   Right middle lobe nodule on CT 06/01/2020, increased from 2017 8.   History of tobacco use 9.   Superficial left upper back lesion noted on CT 08/27/2020   Disposition:   Ms. Dingus has been referred for evaluation of the pancreas mass.  An EUS biopsy returned as "suspicious "for pancreas cancer.  She understands the most likely diagnosis is adenocarcinoma the pancreas, but it is possible the mass is secondary to a benign condition or another malignancy.  Her case will be presented at the GI tumor conference on 08/29/2020.  My initial impression is to recommend surgery to be followed by adjuvant chemotherapy as indicated.  She is scheduled to see Dr. Barry Dienes tomorrow.  I will see her following surgery to review the pathology and recommend adjuvant therapy.  She will be referred to the genetics counselors if a diagnosis of pancreas adenocarcinoma is confirmed.  Betsy Coder, MD  08/28/2020, 2:26 PM

## 2020-08-28 NOTE — Progress Notes (Signed)
Patient presents to [facility] for new patient appointment with her daughter, Burman Freestone. Patient is married to [place] of 50+ years and has #5 grown children (3 girls/ 2 boys) and 11 grandchildren. She is retired from [employer] and now enjoys travel and seeing her grandchildren. Verbalizes some anxiety about her current health situation, but feels she is coping well. Has appointment tomorrow with [facility] for surgical consultation.

## 2020-08-28 NOTE — Progress Notes (Signed)
Met with patient and her daughter today at her initial medical oncology consult with Dr. Julieanne Manson.  I explained my role as nurse navigator and they were given my direct contact information.  The patient has an appointment with Dr. Barry Dienes tomorrow for surgical consult and will be presented in GI Conference tomorrow as well.  She verbalized an understanding of Dr. Gearldine Shown recommendation that she have upfront surgery possibly being followed by adjuvant chemotherapy.  Of course all this is depending on her surgical consult tomorrow.  I escorted them to scheduling to make a follow up appointment.  The patient will be notified in there is change in the plan of care.

## 2020-08-28 NOTE — Telephone Encounter (Signed)
Scheduled appointment per 2/15 los. Spoke to patient who is aware of appointment date and time.

## 2020-08-29 ENCOUNTER — Other Ambulatory Visit: Payer: Self-pay

## 2020-08-29 ENCOUNTER — Other Ambulatory Visit: Payer: Self-pay | Admitting: General Surgery

## 2020-08-29 DIAGNOSIS — K8689 Other specified diseases of pancreas: Secondary | ICD-10-CM | POA: Diagnosis not present

## 2020-08-29 NOTE — Progress Notes (Signed)
The proposed treatment discussed in conference is for discussion purposes only and is not a binding recommendation.  The patients have not been physically examined, or presented with their treatment options.  Therefore, final treatment plans cannot be decided.   

## 2020-09-04 ENCOUNTER — Ambulatory Visit (INDEPENDENT_AMBULATORY_CARE_PROVIDER_SITE_OTHER): Payer: Medicare Other | Admitting: Internal Medicine

## 2020-09-04 ENCOUNTER — Other Ambulatory Visit: Payer: Self-pay

## 2020-09-04 ENCOUNTER — Encounter: Payer: Self-pay | Admitting: Internal Medicine

## 2020-09-04 DIAGNOSIS — M25511 Pain in right shoulder: Secondary | ICD-10-CM | POA: Diagnosis not present

## 2020-09-04 DIAGNOSIS — M5440 Lumbago with sciatica, unspecified side: Secondary | ICD-10-CM

## 2020-09-04 DIAGNOSIS — R634 Abnormal weight loss: Secondary | ICD-10-CM | POA: Diagnosis not present

## 2020-09-04 DIAGNOSIS — C252 Malignant neoplasm of tail of pancreas: Secondary | ICD-10-CM | POA: Diagnosis not present

## 2020-09-04 DIAGNOSIS — G8929 Other chronic pain: Secondary | ICD-10-CM

## 2020-09-04 DIAGNOSIS — Z23 Encounter for immunization: Secondary | ICD-10-CM | POA: Diagnosis not present

## 2020-09-04 MED ORDER — GABAPENTIN 300 MG PO CAPS: 300.0000 mg | ORAL_CAPSULE | Freq: Three times a day (TID) | ORAL | 5 refills | Status: DC

## 2020-09-04 MED ORDER — HYDROCODONE-ACETAMINOPHEN 7.5-325 MG PO TABS: 1.0000 | ORAL_TABLET | Freq: Three times a day (TID) | ORAL | 0 refills | Status: DC | PRN

## 2020-09-04 NOTE — Assessment & Plan Note (Signed)
Surgery is pending

## 2020-09-04 NOTE — Assessment & Plan Note (Signed)
pt lost 30 lbs

## 2020-09-04 NOTE — Assessment & Plan Note (Signed)
Norco prn  Potential benefits of a long term opioids use as well as potential risks (i.e. addiction risk, apnea etc) and complications (i.e. Somnolence, constipation and others) were explained to the patient and were aknowledged. 

## 2020-09-04 NOTE — Assessment & Plan Note (Signed)
ROM exercises Voltare gel Pt decline injection

## 2020-09-04 NOTE — Progress Notes (Signed)
Subjective:  Patient ID: Amanda Arnold, female    DOB: 04/07/1946  Age: 75 y.o. MRN: [id number]  CC: Shoulder Pain ((R) shoulder pt states pain goes down her arm)   HPI Amanda Arnold presents for R arm pain - popping/hurts F/u LBP F/u pancreatic cancer   Outpatient Medications Prior to Visit  Medication Sig Dispense Refill  . gabapentin (NEURONTIN) 300 MG capsule Take 1 capsule (300 mg total) by mouth 3 (three) times daily. (Patient taking differently: Take 300 mg by mouth 3 (three) times daily as needed (pain).) 90 capsule 5  . HYDROcodone-acetaminophen (NORCO) 7.5-325 MG tablet Take 1 tablet by mouth every 8 (eight) hours as needed for severe pain. 90 tablet 0  . ibuprofen (ADVIL) 200 MG tablet Take 600 mg by mouth every 6 (six) hours as needed for moderate pain.    . metoprolol succinate (TOPROL-XL) 25 MG 24 hr tablet TAKE 1/2 TABLET(12.5 MG) BY MOUTH IN AM AND 1/2 PM 90 tablet 1  . omeprazole (PRILOSEC) 40 MG capsule Take 1 capsule (40 mg total) by mouth 2 (two) times daily before a meal. 60 capsule 6  . rosuvastatin (CRESTOR) 10 MG tablet Take 1 tablet (10 mg total) by mouth daily. 90 tablet 3  . bismuth-metronidazole-tetracycline (PYLERA) 140-125-125 MG capsule Take 3 capsules by mouth 4 (four) times daily -  before meals and at bedtime. (Patient not taking: Reported on 09/04/2020) 120 capsule 0  . Cholecalciferol (VITAMIN D3) 2000 UNITS capsule Take 1 capsule (2,000 Units total) by mouth daily. (Patient not taking: No sig reported) 100 capsule 3   No facility-administered medications prior to visit.    ROS: Review of Systems  Constitutional: Positive for unexpected weight change. Negative for activity change, appetite change, chills and fatigue.  HENT: Negative for congestion, mouth sores and sinus pressure.   Eyes: Negative for visual disturbance.  Respiratory: Negative for cough and chest tightness.   Gastrointestinal: Negative for abdominal pain and nausea.   Genitourinary: Negative for difficulty urinating, frequency and vaginal pain.  Musculoskeletal: Positive for arthralgias, back pain and gait problem.  Skin: Negative for pallor and rash.  Neurological: Negative for dizziness, tremors, weakness, numbness and headaches.  Psychiatric/Behavioral: Negative for confusion, sleep disturbance and suicidal ideas. The patient is nervous/anxious.     Objective:  BP 122/78 (BP Location: Left Arm)   Pulse 63   Temp 99 F (37.2 C) (Oral)   Wt 178 lb 9.6 oz (81 kg)   LMP  (LMP Unknown)   SpO2 95%   BMI 32.67 kg/m   BP Readings from Last 3 Encounters:  09/04/20 122/78  08/28/20 (!) 113/52  08/16/20 (!) 142/61    Wt Readings from Last 3 Encounters:  09/04/20 178 lb 9.6 oz (81 kg)  08/28/20 179 lb 3.2 oz (81.3 kg)  08/15/20 183 lb 3.2 oz (83.1 kg)    Physical Exam Constitutional:      General: She is not in acute distress.    Appearance: She is well-developed. She is obese.  HENT:     Head: Normocephalic.     Right Ear: External ear normal.     Left Ear: External ear normal.     Nose: Nose normal.     Mouth/Throat:     Mouth: Oropharynx is clear and moist.  Eyes:     General:        Right eye: No discharge.        Left eye: No discharge.     Conjunctiva/sclera:  Conjunctivae normal.     Pupils: Pupils are equal, round, and reactive to light.  Neck:     Thyroid: No thyromegaly.     Vascular: No JVD.     Trachea: No tracheal deviation.  Cardiovascular:     Rate and Rhythm: Normal rate and regular rhythm.     Heart sounds: Normal heart sounds.  Pulmonary:     Effort: No respiratory distress.     Breath sounds: No stridor. No wheezing.  Abdominal:     General: Bowel sounds are normal. There is no distension.     Palpations: Abdomen is soft. There is no mass.     Tenderness: There is no abdominal tenderness. There is no guarding or rebound.  Musculoskeletal:        General: Tenderness present. No edema.     Cervical back:  Normal range of motion and neck supple.  Lymphadenopathy:     Cervical: No cervical adenopathy.  Skin:    Findings: No erythema or rash.  Neurological:     Mental Status: She is oriented to person, place, and time.     Cranial Nerves: No cranial nerve deficit.     Motor: No abnormal muscle tone.     Coordination: Coordination normal.     Deep Tendon Reflexes: Reflexes normal.  Psychiatric:        Mood and Affect: Mood and affect normal.        Behavior: Behavior normal.        Thought Content: Thought content normal.        Judgment: Judgment normal.     Lab Results  Component Value Date   WBC 4.5 08/22/2020   HGB 12.5 08/22/2020   HCT 37.3 08/22/2020   PLT 135.0 (L) 08/22/2020   GLUCOSE 137 (H) 08/22/2020   CHOL 167 09/17/2017   TRIG 92.0 09/17/2017   HDL 53.00 09/17/2017   LDLCALC 96 09/17/2017   ALT 11 08/22/2020   AST 19 08/22/2020   NA 139 08/22/2020   K 3.7 08/22/2020   CL 104 08/22/2020   CREATININE 0.65 08/22/2020   BUN 8 08/22/2020   CO2 26 08/22/2020   TSH 1.24 02/16/2020   INR 1.1 (H) 08/22/2020   HGBA1C 6.7 11/04/2019    CT CHEST W CONTRAST  Result Date: 08/28/2020 CLINICAL DATA:  Follow-up pancreatic mass EXAM: CT CHEST, ABDOMEN, AND PELVIS WITH CONTRAST TECHNIQUE: Multidetector CT imaging of the chest, abdomen and pelvis was performed following the standard protocol during bolus administration of intravenous contrast. CONTRAST:  12mL OMNIPAQUE IOHEXOL 300 MG/ML SOLN, additional oral enteric contrast COMPARISON:  08/02/2020, MR abdomen, 06/22/2020, CT abdomen pelvis, 06/01/2020 FINDINGS: CT CHEST FINDINGS Cardiovascular: Aortic atherosclerosis. Normal heart size. No pericardial effusion. Mediastinum/Nodes: No enlarged mediastinal, hilar, or axillary lymph nodes. Thyroid gland, trachea, and esophagus demonstrate no significant findings. Lungs/Pleura: Unchanged 6 mm pulmonary nodule of the right middle lobe (series 10, image 89). Mild, predominantly dependent  bibasilar scarring of the lungs. No pleural effusion or pneumothorax. Musculoskeletal: No suspicious bone lesions identified. Superficial subcutaneous lesion of the posterior left back at the level of the scapula, measuring 2.5 x 1.5 cm (series 7, image 18). CT ABDOMEN PELVIS FINDINGS Hepatobiliary: No focal liver abnormality is seen. Hepatic steatosis. Status post cholecystectomy. No biliary dilatation. Pancreas: No significant change in a hypodense mass of the tip of the pancreatic tail measuring 3.2 x 3.0 cm no pancreatic ductal dilatation or surrounding inflammatory changes. Spleen: Normal in size without significant abnormality. Adrenals/Urinary Tract: Fat  containing, definitively benign bilateral adrenal adenomata. Small nonobstructive calculus of the inferior pole of the right kidney (series 7, image 113). Bladder is unremarkable. Stomach/Bowel: Stomach is within normal limits. Appendix appears normal. No evidence of bowel wall thickening, distention, or inflammatory changes. Vascular/Lymphatic: Aortic atherosclerosis. 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 No enlarged abdominal or pelvic lymph nodes. Reproductive: Calcified uterine fibroids. Other: No abdominal wall hernia or abnormality. No abdominopelvic ascites. Musculoskeletal: No acute or significant osseous findings. IMPRESSION: 1. No significant change in a hypodense mass of the tip of the pancreatic tail measuring 3.2 x 3.0 cm. This remains suspicious for pancreatic adenocarcinoma. 2. 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 3. Unchanged 6 mm pulmonary nodule of the right middle lobe, nonspecific; most likely incidental, benign sequelae of infection or inflammation, isolated manifestation of  metastatic disease not favored but difficult to strictly exclude. Attention on follow-up. 4. Small nonobstructive calculus of the inferior pole of the right kidney. 5. Hepatic steatosis. 6. Superficial subcutaneous lesion of the posterior left back at the level of the scapula, measuring 2.5 x 1.5 cm, likely a sebaceous or cutaneous inclusion cyst. Correlate with physical exam. Aortic Atherosclerosis (ICD10-I70.0). Electronically Signed   By: Eddie Candle M.D.   On: 08/28/2020 08:59   CT Abdomen Pelvis W Contrast  Result Date: 08/28/2020 CLINICAL DATA:  Follow-up pancreatic mass EXAM: CT CHEST, ABDOMEN, AND PELVIS WITH CONTRAST TECHNIQUE: Multidetector CT imaging of the chest, abdomen and pelvis was performed following the standard protocol during bolus administration of intravenous contrast. CONTRAST:  116mL OMNIPAQUE IOHEXOL 300 MG/ML SOLN, additional oral enteric contrast COMPARISON:  08/02/2020, MR abdomen, 06/22/2020, CT abdomen pelvis, 06/01/2020 FINDINGS: CT CHEST FINDINGS Cardiovascular: Aortic atherosclerosis. Normal heart size. No pericardial effusion. Mediastinum/Nodes: No enlarged mediastinal, hilar, or axillary lymph nodes. Thyroid gland, trachea, and esophagus demonstrate no significant findings. Lungs/Pleura: Unchanged 6 mm pulmonary nodule of the right middle lobe (series 10, image 89). Mild, predominantly dependent bibasilar scarring of the lungs. No pleural effusion or pneumothorax. Musculoskeletal: No suspicious bone lesions identified. Superficial subcutaneous lesion of the posterior left back at the level of the scapula, measuring 2.5 x 1.5 cm (series 7, image 18). CT ABDOMEN PELVIS FINDINGS Hepatobiliary: No focal liver abnormality is seen. Hepatic steatosis. Status post cholecystectomy. No biliary dilatation. Pancreas: No significant change in a hypodense mass of the tip of the pancreatic tail measuring 3.2 x 3.0 cm no pancreatic ductal dilatation or surrounding inflammatory changes.  Spleen: Normal in size without significant abnormality. Adrenals/Urinary Tract: Fat containing, definitively benign bilateral adrenal adenomata. Small nonobstructive calculus of the inferior pole of the right kidney (series 7, image 113). Bladder is unremarkable. Stomach/Bowel: Stomach is within normal limits. Appendix appears normal. No evidence of bowel wall thickening, distention, or inflammatory changes. Vascular/Lymphatic: Aortic atherosclerosis. 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 No enlarged abdominal or pelvic lymph nodes. Reproductive: Calcified uterine fibroids. Other: No abdominal wall hernia or abnormality. No abdominopelvic ascites. Musculoskeletal: No acute or significant osseous findings. IMPRESSION: 1. No significant change in a hypodense mass of the tip of the pancreatic tail measuring 3.2 x 3.0 cm. This remains suspicious for pancreatic adenocarcinoma. 2. 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 3. Unchanged 6 mm pulmonary nodule of the right middle lobe, nonspecific; most likely incidental, benign sequelae of infection or inflammation, isolated manifestation of metastatic disease not favored but difficult to strictly exclude. Attention on follow-up. 4. Small nonobstructive calculus of the inferior pole of the right kidney. 5. Hepatic steatosis. 6. Superficial subcutaneous lesion of the posterior left back at the level of the scapula, measuring 2.5 x 1.5 cm, likely a sebaceous or cutaneous inclusion cyst. Correlate with physical exam. Aortic Atherosclerosis (ICD10-I70.0). Electronically Signed   By: Eddie Candle M.D.   On: 08/28/2020 08:59    Assessment & Plan:    Walker Kehr, MD

## 2020-09-05 ENCOUNTER — Telehealth: Payer: Self-pay

## 2020-09-05 DIAGNOSIS — C252 Malignant neoplasm of tail of pancreas: Secondary | ICD-10-CM

## 2020-09-05 NOTE — Telephone Encounter (Signed)
[employer]ES 2018-01 STUDY; BLOOD SAMPLE COLLECTION TO EVALUATE BIOMARKERS IN SUBJECTS WITH UNTREATED SOLID TUMORS.   Patient Amanda Arnold was identified by Dr. Benay Spice as a potential candidate for the above listed study.  This Clinical Research Nurse contacted OFFIE PICKRON, [id number], by phone today at 1415pm and verified that I was speaking with the correct person: we spoke for approximately 15 minutes. Patient reads, speaks, and understands [place]. Eligibility has been confirmed by myself and verified by clinical research coordinator Clabe Seal who confirms the patient meets all inclusion and none of the exclusion criteria to be enrolled in the [id number] study.    An overview of the study was discussed, including the blood draw procedure, data collection questions, alternatives to participation, the voluntary nature of participation, potential risks and benefits, costs and compensation, as well as the study purpose. [name] denies having any questions and would like to discuss potential participation with her daughter before making a decision. I offer to send a copy of the informed consent electronically but [name] does not have email. [name] understands that this is a one-time blood collection study that would require completion prior to her surgery (date pending). [name] states she is expecting a call from the surgeon with a surgery date sometime this week.   Current plan is to discuss potential study participation with her daughter and await call back with a surgery date/time. I advise [name] that I will plan to call her again on Monday, 09/10/20, as a follow-up to today's discussion and she agrees with this plan. Should [name] choose to voluntarily participate, we will schedule a consent visit with history worksheet completion and blood sample collection all on the same day for convenience.  [name]. Sharlett Iles, BSN, RN, CIC 09/05/2020 2:43 PM

## 2020-09-10 ENCOUNTER — Telehealth: Payer: Self-pay

## 2020-09-10 DIAGNOSIS — C252 Malignant neoplasm of tail of pancreas: Secondary | ICD-10-CM

## 2020-09-10 NOTE — Telephone Encounter (Signed)
EXACT SCIENCES 2018-01 STUDY; BLOOD SAMPLE COLLECTION TO EVALUATE BIOMARKERS IN SUBJECTS WITH UNTREATED SOLID TUMORS.   OUTGOING CALL: Outgoing follow-up call regarding potential participation in the [place] study to patient Amanda Arnold [id number] at 1415pm: I verified that I was speaking with the correct person and we spoke for approximately 10 minutes.    [name] verbalizes great frustration today, stating she still has not heard from the surgeon office and has concerns that no one has called her with a surgery date. She does state that she received a call from someone at the cancer center stating if she has not heard from the surgeon office by end of day today, to call the provider nurse at the cancer center and they will try to facilitate the process.  Due to her overwhelming anxiety, [name] would like to defer participation in the [employer] study at this time. She is advised that participation is completely voluntary and she is certainly not required to participate. She is encouraged that once her surgery is scheduled, if she changes her mind and would like to participate prior to surgery, to feel free to contact me directly and I will re-confirm eligibility and facilitate participation: she verbalizes understanding.  [name]. Sharlett Iles, BSN, RN, CIC 09/10/2020 3:16 PM

## 2020-09-11 ENCOUNTER — Telehealth: Payer: Self-pay | Admitting: Gastroenterology

## 2020-09-11 NOTE — Telephone Encounter (Signed)
The pt called to say she is scheduled for surgery in April and wanted to know if is could be sooner. I advised her to call [facility] and inquire about timing of the surgery.  The pt has been advised of the information and verbalized understanding.

## 2020-09-11 NOTE — Telephone Encounter (Signed)
Pt states that she has been having issues with her stomach and would like some advise. Pls call her.

## 2020-09-18 ENCOUNTER — Telehealth: Payer: Self-pay | Admitting: Oncology

## 2020-09-18 NOTE — Telephone Encounter (Signed)
Scheduled appt per 3/4 sch msg- patient is aware of appt date and time

## 2020-10-01 ENCOUNTER — Ambulatory Visit: Payer: Medicare Other | Admitting: Oncology

## 2020-10-03 ENCOUNTER — Encounter: Payer: Self-pay | Admitting: Internal Medicine

## 2020-10-03 ENCOUNTER — Ambulatory Visit (INDEPENDENT_AMBULATORY_CARE_PROVIDER_SITE_OTHER): Payer: Medicare Other | Admitting: Internal Medicine

## 2020-10-03 ENCOUNTER — Other Ambulatory Visit: Payer: Self-pay

## 2020-10-03 DIAGNOSIS — M5440 Lumbago with sciatica, unspecified side: Secondary | ICD-10-CM

## 2020-10-03 DIAGNOSIS — F32A Depression, unspecified: Secondary | ICD-10-CM | POA: Diagnosis not present

## 2020-10-03 DIAGNOSIS — E119 Type 2 diabetes mellitus without complications: Secondary | ICD-10-CM | POA: Diagnosis not present

## 2020-10-03 DIAGNOSIS — R634 Abnormal weight loss: Secondary | ICD-10-CM | POA: Diagnosis not present

## 2020-10-03 DIAGNOSIS — G8929 Other chronic pain: Secondary | ICD-10-CM | POA: Diagnosis not present

## 2020-10-03 DIAGNOSIS — C252 Malignant neoplasm of tail of pancreas: Secondary | ICD-10-CM | POA: Diagnosis not present

## 2020-10-03 DIAGNOSIS — F419 Anxiety disorder, unspecified: Secondary | ICD-10-CM | POA: Diagnosis not present

## 2020-10-03 DIAGNOSIS — R1031 Right lower quadrant pain: Secondary | ICD-10-CM | POA: Diagnosis not present

## 2020-10-03 MED ORDER — ALPRAZOLAM 0.5 MG PO TABS: ORAL_TABLET | ORAL | 1 refills | Status: DC

## 2020-10-03 MED ORDER — CELECOXIB 200 MG PO CAPS: ORAL_CAPSULE | ORAL | 1 refills | Status: DC

## 2020-10-03 NOTE — Assessment & Plan Note (Signed)
No change 

## 2020-10-03 NOTE — Assessment & Plan Note (Signed)
Wt Readings from Last 3 Encounters:  10/03/20 177 lb (80.3 kg)  09/04/20 178 lb 9.6 oz (81 kg)  08/28/20 179 lb 3.2 oz (81.3 kg)  doing ok

## 2020-10-03 NOTE — Progress Notes (Signed)
Subjective:  Patient ID: Amanda Arnold, female    DOB: 1946/02/06  Age: 75 y.o. MRN: [id number]  CC: Arm Pain (C/o right arm & leg pain)   HPI DEAJA RIZO presents for RLBP, RLE pain - worse w/ROM, throbbing 10/10. Ibuprofen helped. C/o anxiety F/u pancreatic tumor  Outpatient Medications Prior to Visit  Medication Sig Dispense Refill  . gabapentin (NEURONTIN) 300 MG capsule Take 1 capsule (300 mg total) by mouth 3 (three) times daily. 90 capsule 5  . HYDROcodone-acetaminophen (NORCO) 7.5-325 MG tablet Take 1 tablet by mouth every 8 (eight) hours as needed for severe pain. 90 tablet 0  . ibuprofen (ADVIL) 200 MG tablet Take 600 mg by mouth every 6 (six) hours as needed for moderate pain.    . metoprolol succinate (TOPROL-XL) 25 MG 24 hr tablet TAKE 1/2 TABLET(12.5 MG) BY MOUTH IN AM AND 1/2 PM 90 tablet 1  . omeprazole (PRILOSEC) 40 MG capsule Take 1 capsule (40 mg total) by mouth 2 (two) times daily before a meal. 60 capsule 6  . rosuvastatin (CRESTOR) 10 MG tablet Take 1 tablet (10 mg total) by mouth daily. 90 tablet 3   No facility-administered medications prior to visit.    ROS: Review of Systems  Constitutional: Positive for unexpected weight change. Negative for activity change, appetite change, chills and fatigue.  HENT: Negative for congestion, mouth sores and sinus pressure.   Eyes: Negative for visual disturbance.  Respiratory: Negative for cough and chest tightness.   Gastrointestinal: Negative for abdominal pain, blood in stool and nausea.  Genitourinary: Negative for difficulty urinating, frequency and vaginal pain.  Musculoskeletal: Positive for arthralgias, back pain and gait problem.  Skin: Negative for pallor and rash.  Neurological: Negative for dizziness, tremors, weakness, numbness and headaches.  Psychiatric/Behavioral: Negative for confusion and sleep disturbance. The patient is nervous/anxious.     Objective:  BP 138/80 (BP Location: Left Arm)    Pulse 61   Temp 98.6 F (37 C) (Oral)   Ht 5\' 2"  (1.575 m)   Wt 177 lb (80.3 kg)   LMP  (LMP Unknown)   SpO2 97%   BMI 32.37 kg/m   BP Readings from Last 3 Encounters:  10/03/20 138/80  09/04/20 122/78  08/28/20 (!) 113/52    Wt Readings from Last 3 Encounters:  10/03/20 177 lb (80.3 kg)  09/04/20 178 lb 9.6 oz (81 kg)  08/28/20 179 lb 3.2 oz (81.3 kg)    Physical Exam Constitutional:      General: She is not in acute distress.    Appearance: She is well-developed. She is obese.  HENT:     Head: Normocephalic.     Right Ear: External ear normal.     Left Ear: External ear normal.     Nose: Nose normal.  Eyes:     General:        Right eye: No discharge.        Left eye: No discharge.     Conjunctiva/sclera: Conjunctivae normal.     Pupils: Pupils are equal, round, and reactive to light.  Neck:     Thyroid: No thyromegaly.     Vascular: No JVD.     Trachea: No tracheal deviation.  Cardiovascular:     Rate and Rhythm: Normal rate and regular rhythm.     Heart sounds: Normal heart sounds.  Pulmonary:     Effort: No respiratory distress.     Breath sounds: No stridor. No wheezing.  Abdominal:  General: Bowel sounds are normal. There is no distension.     Palpations: Abdomen is soft. There is no mass.     Tenderness: There is no abdominal tenderness. There is no guarding or rebound.  Musculoskeletal:        General: Tenderness present.     Cervical back: Normal range of motion and neck supple.  Lymphadenopathy:     Cervical: No cervical adenopathy.  Skin:    Findings: No erythema or rash.  Neurological:     Mental Status: She is oriented to person, place, and time.     Cranial Nerves: No cranial nerve deficit.     Motor: No abnormal muscle tone.     Coordination: Coordination normal.     Gait: Gait abnormal.     Deep Tendon Reflexes: Reflexes normal.  Psychiatric:        Behavior: Behavior normal.        Thought Content: Thought content normal.         Judgment: Judgment normal.   LS spine w/pain str leg elev is (-) B   Lab Results  Component Value Date   WBC 4.5 08/22/2020   HGB 12.5 08/22/2020   HCT 37.3 08/22/2020   PLT 135.0 (L) 08/22/2020   GLUCOSE 137 (H) 08/22/2020   CHOL 167 09/17/2017   TRIG 92.0 09/17/2017   HDL 53.00 09/17/2017   LDLCALC 96 09/17/2017   ALT 11 08/22/2020   AST 19 08/22/2020   NA 139 08/22/2020   K 3.7 08/22/2020   CL 104 08/22/2020   CREATININE 0.65 08/22/2020   BUN 8 08/22/2020   CO2 26 08/22/2020   TSH 1.24 02/16/2020   INR 1.1 (H) 08/22/2020   HGBA1C 6.7 11/04/2019    CT CHEST W CONTRAST  Result Date: 08/28/2020 CLINICAL DATA:  Follow-up pancreatic mass EXAM: CT CHEST, ABDOMEN, AND PELVIS WITH CONTRAST TECHNIQUE: Multidetector CT imaging of the chest, abdomen and pelvis was performed following the standard protocol during bolus administration of intravenous contrast. CONTRAST:  142mL OMNIPAQUE IOHEXOL 300 MG/ML SOLN, additional oral enteric contrast COMPARISON:  08/02/2020, MR abdomen, 06/22/2020, CT abdomen pelvis, 06/01/2020 FINDINGS: CT CHEST FINDINGS Cardiovascular: Aortic atherosclerosis. Normal heart size. No pericardial effusion. Mediastinum/Nodes: No enlarged mediastinal, hilar, or axillary lymph nodes. Thyroid gland, trachea, and esophagus demonstrate no significant findings. Lungs/Pleura: Unchanged 6 mm pulmonary nodule of the right middle lobe (series 10, image 89). Mild, predominantly dependent bibasilar scarring of the lungs. No pleural effusion or pneumothorax. Musculoskeletal: No suspicious bone lesions identified. Superficial subcutaneous lesion of the posterior left back at the level of the scapula, measuring 2.5 x 1.5 cm (series 7, image 18). CT ABDOMEN PELVIS FINDINGS Hepatobiliary: No focal liver abnormality is seen. Hepatic steatosis. Status post cholecystectomy. No biliary dilatation. Pancreas: No significant change in a hypodense mass of the tip of the pancreatic tail measuring  3.2 x 3.0 cm no pancreatic ductal dilatation or surrounding inflammatory changes. Spleen: Normal in size without significant abnormality. Adrenals/Urinary Tract: Fat containing, definitively benign bilateral adrenal adenomata. Small nonobstructive calculus of the inferior pole of the right kidney (series 7, image 113). Bladder is unremarkable. Stomach/Bowel: Stomach is within normal limits. Appendix appears normal. No evidence of bowel wall thickening, distention, or inflammatory changes. Vascular/Lymphatic: Aortic atherosclerosis. 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 No enlarged abdominal or pelvic lymph nodes. Reproductive: Calcified uterine fibroids. Other: No abdominal wall hernia  or abnormality. No abdominopelvic ascites. Musculoskeletal: No acute or significant osseous findings. IMPRESSION: 1. No significant change in a hypodense mass of the tip of the pancreatic tail measuring 3.2 x 3.0 cm. This remains suspicious for pancreatic adenocarcinoma. 2. 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 3. Unchanged 6 mm pulmonary nodule of the right middle lobe, nonspecific; most likely incidental, benign sequelae of infection or inflammation, isolated manifestation of metastatic disease not favored but difficult to strictly exclude. Attention on follow-up. 4. Small nonobstructive calculus of the inferior pole of the right kidney. 5. Hepatic steatosis. 6. Superficial subcutaneous lesion of the posterior left back at the level of the scapula, measuring 2.5 x 1.5 cm, likely a sebaceous or cutaneous inclusion cyst. Correlate with physical exam. Aortic Atherosclerosis (ICD10-I70.0). Electronically Signed   By: Eddie Candle M.D.   On: 08/28/2020 08:59   CT Abdomen  Pelvis W Contrast  Result Date: 08/28/2020 CLINICAL DATA:  Follow-up pancreatic mass EXAM: CT CHEST, ABDOMEN, AND PELVIS WITH CONTRAST TECHNIQUE: Multidetector CT imaging of the chest, abdomen and pelvis was performed following the standard protocol during bolus administration of intravenous contrast. CONTRAST:  19mL OMNIPAQUE IOHEXOL 300 MG/ML SOLN, additional oral enteric contrast COMPARISON:  08/02/2020, MR abdomen, 06/22/2020, CT abdomen pelvis, 06/01/2020 FINDINGS: CT CHEST FINDINGS Cardiovascular: Aortic atherosclerosis. Normal heart size. No pericardial effusion. Mediastinum/Nodes: No enlarged mediastinal, hilar, or axillary lymph nodes. Thyroid gland, trachea, and esophagus demonstrate no significant findings. Lungs/Pleura: Unchanged 6 mm pulmonary nodule of the right middle lobe (series 10, image 89). Mild, predominantly dependent bibasilar scarring of the lungs. No pleural effusion or pneumothorax. Musculoskeletal: No suspicious bone lesions identified. Superficial subcutaneous lesion of the posterior left back at the level of the scapula, measuring 2.5 x 1.5 cm (series 7, image 18). CT ABDOMEN PELVIS FINDINGS Hepatobiliary: No focal liver abnormality is seen. Hepatic steatosis. Status post cholecystectomy. No biliary dilatation. Pancreas: No significant change in a hypodense mass of the tip of the pancreatic tail measuring 3.2 x 3.0 cm no pancreatic ductal dilatation or surrounding inflammatory changes. Spleen: Normal in size without significant abnormality. Adrenals/Urinary Tract: Fat containing, definitively benign bilateral adrenal adenomata. Small nonobstructive calculus of the inferior pole of the right kidney (series 7, image 113). Bladder is unremarkable. Stomach/Bowel: Stomach is within normal limits. Appendix appears normal. No evidence of bowel wall thickening, distention, or inflammatory changes. Vascular/Lymphatic: Aortic atherosclerosis. The splenic vein is occluded at the pancreatic tail  by adjacent mass (series 7, image 99) with vascular collaterals about the splenic hilum and gastroesophageal junction. No enlarged abdominal or pelvic lymph nodes. Reproductive: Calcified uterine fibroids. Other: No abdominal wall hernia or abnormality. No abdominopelvic ascites. Musculoskeletal: No acute or significant osseous findings. IMPRESSION: 1. No significant change in a hypodense mass of the tip of the pancreatic tail measuring 3.2 x 3.0 cm. This remains suspicious for pancreatic adenocarcinoma. 2. 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 3. Unchanged 6 mm pulmonary nodule of the right middle lobe, nonspecific; most likely incidental, benign sequelae of infection or inflammation, isolated manifestation of metastatic disease not favored but difficult to strictly exclude. Attention on follow-up. 4. Small nonobstructive calculus of the inferior pole of the right kidney. 5. Hepatic steatosis. 6. Superficial subcutaneous lesion of the posterior left back at the level of the scapula, measuring  2.5 x 1.5 cm, likely a sebaceous or cutaneous inclusion cyst. Correlate with physical exam. Aortic Atherosclerosis (ICD10-I70.0). Electronically Signed   By: Eddie Candle M.D.   On: 08/28/2020 08:59    Assessment & Plan:    Follow-up: No follow-ups on file.  Walker Kehr, MD

## 2020-10-03 NOTE — Assessment & Plan Note (Signed)
Surgery on 10/17/20 pending

## 2020-10-03 NOTE — Assessment & Plan Note (Signed)
Chronic  Xanax prn  Potential benefits of a long term benzodiazepines  use as well as potential risks  and complications were explained to the patient and were aknowledged. 

## 2020-10-03 NOTE — Assessment & Plan Note (Signed)
Worse Celebrex prn Norco prn  Potential benefits of a long term opioids use as well as potential risks (i.e. addiction risk, apnea etc) and complications (i.e. Somnolence, constipation and others) were explained to the patient and were aknowledged.  Potential benefits of a long term NSAIDs  use as well as potential risks  and complications were explained to the patient and were aknowledged.

## 2020-10-13 ENCOUNTER — Inpatient Hospital Stay (HOSPITAL_COMMUNITY)
Admission: RE | Admit: 2020-10-13 | Discharge: 2020-10-13 | Disposition: A | Discharge status: Home / Self Care | Payer: Medicare Other | Source: Ambulatory Visit

## 2020-10-13 NOTE — Progress Notes (Signed)
Covid positive 07/25/20. No need to test at this time per anesthesia protocol.

## 2020-10-16 ENCOUNTER — Encounter (HOSPITAL_COMMUNITY): Payer: Self-pay | Admitting: General Surgery

## 2020-10-16 ENCOUNTER — Other Ambulatory Visit: Payer: Self-pay

## 2020-10-16 ENCOUNTER — Ambulatory Visit: Payer: Medicare Other | Admitting: Internal Medicine

## 2020-10-16 NOTE — Progress Notes (Addendum)
Anesthesia Chart Review: SAME DAY WORK-UP   Case: [id number] Date/Time: 10/17/20 0815   Procedures:      LAPAROSCOPY DIAGNOSTIC (N/A )     XI ROBOTIC ASSISTED LAPAROSCOPIC DISTAL PANCREATECTOMY (N/A )     SPLENECTOMY (N/A )   Anesthesia type: General   Pre-op diagnosis: PANCREATIC MASS   Location: [facility] OR ROOM 10 / [place] [facility]   Surgeons: [name], MD      DISCUSSION: Patient is a 75 year old female scheduled for the above procedure. By notes, she began to develop abdominal discomfort in fall 2021. 05/2020 CT imaging revealed a pancreatic tail mass and known RML lung nodule. 06/22/20 MRI confirmed pancreatic mass with occlusion of the splenic vein with mildly dilated venous collaterals. EUS on 08/16/20 showed active gastritis with H. Pylori and FNA of pancreatic mass was suspicious for well-differentiated adenocarcinoma. She was referred to general surgery.  Other history includes smoking, pancreatic mass (with splenic vein occlusion ~ 06/2020), HLD, anxiety, somatization disorder, HTN, trigeminal neuralgia, esotropia left eye, COPD, fibromyalgia, migraines, OSA (does not use CPAP), diet controlled DM2, insomnia.   She underwent cardiology evaluation by Dr. Marlou Porch on 04/10/20 for palpitations and chest tightness. Coronary CT and echo ordered with follow-up to be determined based on testing. Echo showed EF 63%, no regional wall motion abnormalities, grade 1 diastolic dysfunction, trivial MR. CCTA showed minimal CAD with 0-24% RCA and incidental finding of 5 mm RML lung nodule. Crestor recommended and repeat chest CT in 12 months.   + COVID-19 07/25/20. No preoperative COVID-19 test done given positive test within the past 90 days. She is a same day work-up, so labs and anesthesia team evaluation on the day of surgery.     VS: LMP  (LMP Unknown)   BP Readings from Last 3 Encounters:  10/03/20 138/80  09/04/20 122/78  08/28/20 (!) 113/52   Pulse Readings from Last 3 Encounters:  10/03/20 61   09/04/20 63  08/28/20 60    PROVIDERS: Plotnikov, Evie Lacks, MD is PCP. Last visit 10/03/20.  Benay Spice, Izola Price, MD is HEM-ONC - Mansouraty, Valarie Merino, MD & Scarlette Shorts, MD are GI - Candee Furbish, MD is cardiologist   LABS: For day of surgery. As of 08/22/20, Cr 0.65, H/H 12.5/37.3, PLT 135K, AST 19, ALT 11, PT 12.6, INR 1.1, glucose 137. Last A1c 6.7% on 11/04/19.    IMAGES: CTA Chest/abd/pelvis 08/27/20: IMPRESSION: 1. No significant change in a hypodense mass of the tip of the pancreatic tail measuring 3.2 x 3.0 cm. This remains suspicious for pancreatic adenocarcinoma. 2. 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 3. Unchanged 6 mm pulmonary nodule of the right middle lobe, nonspecific; most likely incidental, benign sequelae of infection or inflammation, isolated manifestation of metastatic disease not favored but difficult to strictly exclude. Attention on follow-up. 4. Small nonobstructive calculus of the inferior pole of the right kidney. 5. Hepatic steatosis. 6. Superficial subcutaneous lesion of the posterior left back at the level of the scapula, measuring 2.5 x 1.5 cm, likely a sebaceous or cutaneous inclusion cyst. Correlate with physical exam.   EKG: 07/25/20: Normal sinus rhythm Nonspecific ST and T wave abnormality Abnormal ECG When compared with ECG of 02/08/2018, No significant change was found Confirmed by Delora Fuel ([id number]) on 07/26/2020 12:32:28 AM   CV: CT Coronary 04/24/20: -Aorta:  Normal size.  No calcifications.  No dissection. - Aortic Valve:  Trileaflet.  No calcifications. - Coronary Arteries:   Normal coronary origin.  Right dominance. RCA is a large dominant artery that gives rise to PDA and PLA. Mild RCA prox/mid calcified plaque, 0-24% stenosis. Left main is a large artery that gives rise to LAD and LCX  arteries. LAD is a large vessel that has no plaque. LCX is a non-dominant artery that gives rise to one large OM1 branch. There is no plaque. - Other findings: Normal pulmonary vein drainage into the left atrium. Normal left atrial appendage without a thrombus. Normal size of the pulmonary artery. Please see radiology report for non cardiac findings.   IMPRESSION: 1. Coronary calcium score of 8. This was 81 percentile for age and sex matched control. 2. Normal coronary origin with right dominance. 3.  Mild RCA calcified plaque, 0-24% stenosis. 4. CAD-RADS 1. Minimal non-obstructive CAD (0-24%). Consider non-atherosclerotic causes of chest pain. Consider preventive therapy and risk factor modification.   Echo 03/13/20: IMPRESSIONS  1. Left ventricular ejection fraction by 3D volume is 63 %. The left  ventricle has normal function. The left ventricle has no regional wall  motion abnormalities. Left ventricular diastolic parameters are consistent  with Grade I diastolic dysfunction  (impaired relaxation).  2. Right ventricular systolic function is normal. The right ventricular  size is normal. Tricuspid regurgitation signal is inadequate for assessing  PA pressure.  3. The mitral valve is normal in structure. Trivial mitral valve  regurgitation. No evidence of mitral stenosis.  4. The aortic valve is grossly normal. Aortic valve regurgitation is not  visualized. No aortic stenosis is present.  5. The inferior vena cava is normal in size with greater than 50%  respiratory variability, suggesting right atrial pressure of 3 mmHg.    Past Medical History:  Diagnosis Date  . Abdominal pain, epigastric 12/18/2009  . ANXIETY 05/25/2006  . BRONCHITIS, ACUTE 12/08/2007  . COPD 05/25/2006  . DEPRESSION 01/02/2006  . Diabetes mellitus without complication ([place])    type 2 - diet controlled - no meds  . ESOTROPIA, LEFT EYE 08-16-1945  . FIBROMYALGIA 04/02/2006  . Headache(784.0)  12/08/2007  . HYPERLIPIDEMIA 04/30/2006  . HYPERTENSION 05/25/2006  . Hypertension   . INSOMNIA, PERSISTENT 02/22/2008  . Lazy eye    left - no surgery  . LOW BACK PAIN 01/27/2007  . Migraines   . 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 OBSTRUCTIVE SLEEP APNEA 04/17/2010   does not use CPAP  . OSTEOARTHRITIS 05/25/2006  . Polyuria   . Pre-diabetes    diet controlled  . Psychosomatic disease 2011  . 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 T M J 05/11/2009    Past Surgical History:  Procedure Laterality Date  . BIOPSY  08/16/2020   Procedure: BIOPSY;  Surgeon: Rush Landmark Telford Nab., MD;  Location: [place];  Service: Endoscopy;;  . CHOLECYSTECTOMY    . COLONOSCOPY    . ESOPHAGOGASTRODUODENOSCOPY (EGD) WITH PROPOFOL N/A 08/16/2020   Procedure: ESOPHAGOGASTRODUODENOSCOPY (EGD) WITH PROPOFOL;  Surgeon: Rush Landmark Telford Nab., MD;  Location: [place];  Service: Endoscopy;  Laterality: N/A;  . EUS N/A 08/16/2020   Procedure: UPPER ENDOSCOPIC ULTRASOUND (EUS) RADIAL;  Surgeon: Rush Landmark Telford Nab., MD;  Location: [place];  Service: Endoscopy;  Laterality: N/A;  . EYE SURGERY Bilateral    cataracts removed  . FINE NEEDLE ASPIRATION  08/16/2020   Procedure: FINE NEEDLE ASPIRATION (FNA) LINEAR;  Surgeon: Irving Copas., MD;  Location: [place];  Service: Endoscopy;;  . MASS EXCISION Left 10/06/2016   Procedure:  EXCISION LEFT BUTTOCKS MASS;  Surgeon: Alphonsa Overall, MD;  Location: [place];  Service: General;  Laterality: Left;    MEDICATIONS: No current facility-administered medications for this encounter.   Marland Kitchen ALPRAZolam (XANAX) 0.5 MG tablet  . gabapentin (NEURONTIN) 300 MG capsule  . HYDROcodone-acetaminophen (NORCO) 7.5-325 MG tablet  . metoprolol succinate (TOPROL-XL) 25 MG 24 hr tablet  . omeprazole (PRILOSEC) 40 MG capsule  . rosuvastatin (CRESTOR) 10 MG tablet  . celecoxib (CELEBREX) 200 MG capsule    Myra Gianotti, PA-C Surgical Short  Stay/Anesthesiology [facility] Phone [phone] [facility] Phone [phone] 10/16/2020 3:43 PM

## 2020-10-16 NOTE — Progress Notes (Addendum)
Amanda Arnold denies chest pain, palpatations or shortness of breath. Patient was positive for Covid 07/25/20, she does not need a repeat test, she denies any s/s of Covid.  Amanda Arnold states that she has type II diabetes she does not take medication or check CBG.

## 2020-10-16 NOTE — Anesthesia Preprocedure Evaluation (Addendum)
Anesthesia Evaluation  Patient identified by MRN, date of birth, ID band Patient awake    Reviewed: Allergy & Precautions, H&P , NPO status , Patient's Chart, lab work & pertinent test results  Airway Mallampati: II   Neck ROM: full    Dental   Pulmonary sleep apnea , COPD, former smoker,    breath sounds clear to auscultation       Cardiovascular hypertension,  Rhythm:regular Rate:Normal  CT Coronary 04/24/20: 1. Coronary calcium score of 8. This was 82 percentile for age and sex matched control. 2. Normal coronary origin with right dominance. 3.  Mild RCA calcified plaque, 0-24% stenosis. 4. CAD-RADS 1. Minimal non-obstructive CAD (0-24%). Consider non-atherosclerotic causes of chest pain.   Echo 03/13/20: 1. Left ventricular ejection fraction by 3D volume is 63 %. The left ventricle has normal function. The left ventricle has no regional wall motion abnormalities. Left ventricular diastolic parameters are consistent with Grade I diastolic dysfunction (impaired relaxation).  2. Right ventricular systolic function is normal. The right ventricular size is normal. Tricuspid regurgitation signal is inadequate for assessing PA pressure.  3. The mitral valve is normal in structure. Trivial mitral valve regurgitation. No evidence of mitral stenosis.  4. The aortic valve is grossly normal. Aortic valve regurgitation is not visualized. No aortic stenosis is present.  5. The inferior vena cava is normal in size with greater than 50% respiratory variability, suggesting right atrial pressure of 3 mmHg.    Neuro/Psych  Headaches, PSYCHIATRIC DISORDERS Anxiety Depression  Neuromuscular disease negative neurological ROS  negative psych ROS   GI/Hepatic Neg liver ROS, GERD  ,  Endo/Other  diabetes, Type 2  Renal/GU negative Renal ROS  negative genitourinary   Musculoskeletal negative musculoskeletal ROS (+) Arthritis ,   Abdominal    Peds negative pediatric ROS (+)  Hematology negative hematology ROS (+)   Anesthesia Other Findings   Reproductive/Obstetrics negative OB ROS                           Anesthesia Physical Anesthesia Plan  ASA: II  Anesthesia Plan: General   Post-op Pain Management:    Induction: Intravenous  PONV Risk Score and Plan: 3 and Ondansetron, Dexamethasone, Midazolam and Treatment may vary due to age or medical condition  Airway Management Planned: Oral ETT  Additional Equipment: Arterial line  Intra-op Plan:   Post-operative Plan: Extubation in OR  Informed Consent: I have reviewed the patients History and Physical, chart, labs and discussed the procedure including the risks, benefits and alternatives for the proposed anesthesia with the patient or authorized representative who has indicated his/her understanding and acceptance.     Dental advisory given  Plan Discussed with: CRNA, Anesthesiologist and Surgeon  Anesthesia Plan Comments: (PAT note written 10/16/2020 by Myra Gianotti, PA-C. SAME DAY WORK-UP   )       Anesthesia Quick Evaluation

## 2020-10-16 NOTE — H&P (Signed)
Amanda Arnold Location: [facility] Surgery Patient #: [id number] DOB: [birth year] Married / Language: English / Race: White Female   History of Present Illness  The patient is a 75 year old female who presents with a pancreatic mass. Pt is a 75 yo F who had an incidentally discovered pancreatic tail mass when she was undergoing workup for right sided flank pain and leg numbness. This started in November. She also felt ill with diarrhea and no appetite. She lost around 20 pounds. She has stabilized that now. She denies LUQ pain or early satiety. She already had cholecystectomy many years ago. She had EUS and biopsy, but FNA was not definitive for cancer. EUS staging would be uT2N0. She saw Dr. Benay Spice yesterday and given diagnostic uncertainty, surgery was recommended first. She does not have breast/ovarian/pancreas cancer in her family history. Her mother had cervical cancer and a brother had AML.    CT c/a/p 08/27/2020 IMPRESSION: 1. No significant change in a hypodense mass of the tip of the pancreatic tail measuring 3.2 x 3.0 cm. This remains suspicious for pancreatic adenocarcinoma. 2. 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 3. Unchanged 6 mm pulmonary nodule of the right middle lobe, nonspecific; most likely incidental, benign sequelae of infection or inflammation, isolated manifestation of metastatic disease not favored but difficult to strictly exclude. Attention on follow-up. 4. Small nonobstructive calculus of the inferior pole of the right kidney. 5. Hepatic steatosis. 6. Superficial subcutaneous lesion of the posterior left back at the level of the scapula, measuring 2.5 x 1.5 cm, likely a sebaceous or cutaneous inclusion cyst. Correlate with physical exam.  cytology 08/16/2020 A. PANCREATIC TAIL MASS, FINE NEEDLE ASPIRATION: -  Suspicious for malignancy.  COMMENT:  Suspicious for well-differentiated adenocarcinoma.  EUS mansouraty 08/16/20 EUS Impression: - A mass was identified in the pancreatic tail. Cytology results are pending. However, the endosonographic appearance is highly suspicious for adenocarcinoma. This was staged T2 N0 Mx by endosonographic criteria. The staging applies if malignancy is confirmed. Fine needle biopsy performed. - Sub 1 cm pancreatic cyst in tail noted. - The pancreatic duct was visualized in the pancreatic head, genu of the pancreas, body of the pancreas and tail of the pancreas. - There was no sign of significant pathology in the common bile duct and in the common hepatic duct. - No malignant-appearing lymph nodes were visualized in the celiac region (level 20), peripancreatic region and porta hepatis region.   Past Surgical History Colon Polyp Removal - Colonoscopy   Diagnostic Studies History  Colonoscopy  1-5 years ago Mammogram  1-3 years ago within last year Pap Smear  1-5 years ago >5 years ago  Allergies Naproxen *ANALGESICS - ANTI-INFLAMMATORY*  Morphine Sulfate *ANALGESICS - OPIOID*  BuPROPion HCl *CHEMICALS*  Ezetimibe *ANTIHYPERLIPIDEMICS*  MetFORMIN HCl *CHEMICALS*  TraZODone HCl *ANTIDEPRESSANTS*  TraMADol HCl *CHEMICALS*  Allergies Reconciled   Medication History ALPRAZolam (0.5MG  Tablet, Oral) Active. Hydrocodone-Acetaminophen (7.5-325MG  Tablet, Oral) Active. Metoprolol Succinate ER (25MG  Tablet ER 24HR, Oral) Active. Vitamin D (2000UNIT Capsule, Oral) Active. Nizoral (2% Cream, External) Active. Omeprazole (40MG  Capsule DR, Oral) Active. Gabapentin (300MG  Capsule, Oral) Active. Rosuvastatin Calcium (10MG  Tablet, Oral) Active. Metoprolol Tartrate (100MG  Tablet, Oral) Active. Medications Reconciled  Social History Alcohol use  Remotely quit alcohol use. Caffeine use  Carbonated beverages. No drug use  Tobacco use   Current some day smoker, Former smoker.  Family History Alcohol  Abuse  Brother, Father, Son. Arthritis  Mother, Son. Cervical Cancer  Mother. Depression  Brother, Father, Mother, Son. Heart Disease  Father. Hypertension  Father, Mother.  Pregnancy / Birth History Age at menarche  58 years. Age of menopause  54-55 <45 Gravida  7 8 Length (months) of breastfeeding  3-6 Maternal age  61-20 Para  5  Other Problems  Anxiety Disorder  Arthritis  Back Pain  Depression  Diabetes Mellitus  General anesthesia - complications  High blood pressure     Review of Systems  General Not Present- Appetite Loss, Chills, Fatigue, Fever, Night Sweats, Weight Gain and Weight Loss. Skin Present- Dryness and Rash. Not Present- Change in Wart/Mole, Hives, Jaundice, New Lesions, Non-Healing Wounds and Ulcer. HEENT Not Present- Earache, Hearing Loss, Hoarseness, Nose Bleed, Oral Ulcers, Ringing in the Ears, Seasonal Allergies, Sinus Pain, Sore Throat, Visual Disturbances, Wears glasses/contact lenses and Yellow Eyes. Respiratory Not Present- Bloody sputum, Chronic Cough, Difficulty Breathing, Snoring and Wheezing. Breast Not Present- Breast Mass, Breast Pain, Nipple Discharge and Skin Changes. Cardiovascular Not Present- Chest Pain, Difficulty Breathing Lying Down, Leg Cramps, Palpitations, Rapid Heart Rate, Shortness of Breath and Swelling of Extremities. Gastrointestinal Not Present- Abdominal Pain, Bloating, Bloody Stool, Change in Bowel Habits, Chronic diarrhea, Constipation, Difficulty Swallowing, Excessive gas, Gets full quickly at meals, Hemorrhoids, Indigestion, Nausea, Rectal Pain and Vomiting. Female Genitourinary Present- Frequency and Nocturia. Not Present- Painful Urination, Pelvic Pain and Urgency. Musculoskeletal Present- Back Pain, Joint Pain, Muscle Pain and Muscle Weakness. Not Present- Joint Stiffness and Swelling of Extremities. Neurological Present- Numbness and  Tingling. Not Present- Decreased Memory, Fainting, Headaches, Seizures, Tremor, Trouble walking and Weakness. Psychiatric Present- Anxiety and Depression. Not Present- Bipolar, Change in Sleep Pattern, Fearful and Frequent crying. Endocrine Present- Hair Changes and Hot flashes. Not Present- Cold Intolerance, Excessive Hunger, Heat Intolerance and New Diabetes. Hematology Present- Easy Bruising, Excessive bleeding and Persistent Infections. Not Present- Blood Thinners, Gland problems and HIV.  Vitals Weight: 179.6 lb Height: 62in Body Surface Area: 1.83 m Body Mass Index: 32.85 kg/m  Temp.: 97.37F  Pulse: 75 (Regular)  BP: 144/74(Sitting, Left Arm, Standard)       Physical Exam General Mental Status-Alert. General Appearance-Consistent with stated age. Hydration-Well hydrated. Voice-Normal.  Head and Neck Head-normocephalic, atraumatic with no lesions or palpable masses. Trachea-midline. Thyroid Gland Characteristics - normal size and consistency.  Eye Eyeball - Bilateral-Extraocular movements intact. Sclera/Conjunctiva - Bilateral-No scleral icterus.  Chest and Lung Exam Chest and lung exam reveals -quiet, even and easy respiratory effort with no use of accessory muscles and on auscultation, normal breath sounds, no adventitious sounds and normal vocal resonance. Inspection Chest Wall - Normal. Back - normal.  Cardiovascular Cardiovascular examination reveals -normal heart sounds, regular rate and rhythm with no murmurs and normal pedal pulses bilaterally.  Abdomen Inspection Inspection of the abdomen reveals - No Hernias. Palpation/Percussion Palpation and Percussion of the abdomen reveal - Soft, Non Tender, No Rebound tenderness, No Rigidity (guarding) and No hepatosplenomegaly. Auscultation Auscultation of the abdomen reveals - Bowel sounds normal.  Neurologic Neurologic evaluation reveals -alert and oriented x 3 with no  impairment of recent or remote memory. Mental Status-Normal.  Musculoskeletal Global Assessment -Note: no gross deformities.  Normal Exam - Left-Upper Extremity Strength Normal and Lower Extremity Strength Normal. Normal Exam - Right-Upper Extremity Strength Normal and Lower Extremity Strength Normal.  Lymphatic Head & Neck  General Head & Neck Lymphatics: Bilateral - Description - Normal. Axillary  General Axillary Region: Bilateral - Description -  Normal. Tenderness - Non Tender. Femoral & Inguinal  Generalized Femoral & Inguinal Lymphatics: Bilateral - Description - No Generalized lymphadenopathy.    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 I have told her and her daughter that this is the diagnosis we expect.  I will plan robotic distal pancreatectomy/splenectomy. I discussed risks of surgery including bleeding, infection, damage to adjacent structures, heart/lung complications, hernia, risk of positive margin, risk of cancer recurrence. She understands and wishes to proceed.  This would likely be followed by chemotherapy. I discussed risks of prolonged hospitalization and risk of prolonged drain placement. Current Plans Pt Education - FB pancreatectomy You are being scheduled for surgery- Our schedulers will call you.  You should hear from our office's scheduling department within 5 working days about the location, date, and time of surgery. We try to make accommodations for patient's preferences in scheduling surgery, but sometimes the OR schedule or the surgeon's schedule prevents [place] from making those accommodations.  If you have not heard from our office [phone]) in 5 working days, call the office and ask for your surgeon's nurse.  If you have other questions about your diagnosis, plan, or surgery, call the office and ask for your surgeon's nurse.

## 2020-10-17 ENCOUNTER — Inpatient Hospital Stay (HOSPITAL_COMMUNITY): Payer: Medicare Other | Admitting: Vascular Surgery

## 2020-10-17 ENCOUNTER — Encounter (HOSPITAL_COMMUNITY)
Admission: RE | Disposition: A | Discharge status: Home / Self Care | Payer: Self-pay | Source: Home / Self Care | Attending: General Surgery

## 2020-10-17 ENCOUNTER — Encounter (HOSPITAL_COMMUNITY): Payer: Self-pay | Admitting: General Surgery

## 2020-10-17 ENCOUNTER — Inpatient Hospital Stay (HOSPITAL_COMMUNITY)
Admission: RE | Admit: 2020-10-17 | Discharge: 2020-10-23 | DRG: 406 | Disposition: A | Discharge status: Home / Self Care | Payer: Medicare Other | Attending: General Surgery | Admitting: General Surgery

## 2020-10-17 DIAGNOSIS — R509 Fever, unspecified: Secondary | ICD-10-CM | POA: Diagnosis not present

## 2020-10-17 DIAGNOSIS — Z8261 Family history of arthritis: Secondary | ICD-10-CM | POA: Diagnosis not present

## 2020-10-17 DIAGNOSIS — C252 Malignant neoplasm of tail of pancreas: Secondary | ICD-10-CM | POA: Diagnosis not present

## 2020-10-17 DIAGNOSIS — E785 Hyperlipidemia, unspecified: Secondary | ICD-10-CM | POA: Diagnosis not present

## 2020-10-17 DIAGNOSIS — D62 Acute posthemorrhagic anemia: Secondary | ICD-10-CM | POA: Diagnosis not present

## 2020-10-17 DIAGNOSIS — Z885 Allergy status to narcotic agent status: Secondary | ICD-10-CM | POA: Diagnosis not present

## 2020-10-17 DIAGNOSIS — I10 Essential (primary) hypertension: Secondary | ICD-10-CM | POA: Diagnosis present

## 2020-10-17 DIAGNOSIS — E440 Moderate protein-calorie malnutrition: Secondary | ICD-10-CM | POA: Diagnosis not present

## 2020-10-17 DIAGNOSIS — G4700 Insomnia, unspecified: Secondary | ICD-10-CM | POA: Diagnosis not present

## 2020-10-17 DIAGNOSIS — G4733 Obstructive sleep apnea (adult) (pediatric): Secondary | ICD-10-CM | POA: Diagnosis not present

## 2020-10-17 DIAGNOSIS — M797 Fibromyalgia: Secondary | ICD-10-CM | POA: Diagnosis not present

## 2020-10-17 DIAGNOSIS — K8689 Other specified diseases of pancreas: Secondary | ICD-10-CM | POA: Diagnosis present

## 2020-10-17 DIAGNOSIS — Z8616 Personal history of COVID-19: Secondary | ICD-10-CM | POA: Diagnosis not present

## 2020-10-17 DIAGNOSIS — R911 Solitary pulmonary nodule: Secondary | ICD-10-CM | POA: Diagnosis not present

## 2020-10-17 DIAGNOSIS — K869 Disease of pancreas, unspecified: Secondary | ICD-10-CM | POA: Diagnosis present

## 2020-10-17 DIAGNOSIS — E1165 Type 2 diabetes mellitus with hyperglycemia: Secondary | ICD-10-CM | POA: Diagnosis present

## 2020-10-17 DIAGNOSIS — C772 Secondary and unspecified malignant neoplasm of intra-abdominal lymph nodes: Secondary | ICD-10-CM | POA: Diagnosis not present

## 2020-10-17 DIAGNOSIS — J449 Chronic obstructive pulmonary disease, unspecified: Secondary | ICD-10-CM | POA: Diagnosis present

## 2020-10-17 DIAGNOSIS — Z888 Allergy status to other drugs, medicaments and biological substances status: Secondary | ICD-10-CM

## 2020-10-17 DIAGNOSIS — J9811 Atelectasis: Secondary | ICD-10-CM | POA: Diagnosis not present

## 2020-10-17 DIAGNOSIS — I517 Cardiomegaly: Secondary | ICD-10-CM | POA: Diagnosis not present

## 2020-10-17 DIAGNOSIS — Z818 Family history of other mental and behavioral disorders: Secondary | ICD-10-CM

## 2020-10-17 DIAGNOSIS — E669 Obesity, unspecified: Secondary | ICD-10-CM | POA: Diagnosis present

## 2020-10-17 DIAGNOSIS — Z79899 Other long term (current) drug therapy: Secondary | ICD-10-CM | POA: Diagnosis not present

## 2020-10-17 DIAGNOSIS — Z6832 Body mass index (BMI) 32.0-32.9, adult: Secondary | ICD-10-CM

## 2020-10-17 DIAGNOSIS — C771 Secondary and unspecified malignant neoplasm of intrathoracic lymph nodes: Secondary | ICD-10-CM | POA: Diagnosis not present

## 2020-10-17 DIAGNOSIS — Z87891 Personal history of nicotine dependence: Secondary | ICD-10-CM

## 2020-10-17 DIAGNOSIS — F419 Anxiety disorder, unspecified: Secondary | ICD-10-CM | POA: Diagnosis present

## 2020-10-17 DIAGNOSIS — Z8249 Family history of ischemic heart disease and other diseases of the circulatory system: Secondary | ICD-10-CM

## 2020-10-17 DIAGNOSIS — H50012 Monocular esotropia, left eye: Secondary | ICD-10-CM | POA: Diagnosis not present

## 2020-10-17 DIAGNOSIS — C258 Malignant neoplasm of overlapping sites of pancreas: Secondary | ICD-10-CM | POA: Diagnosis not present

## 2020-10-17 HISTORY — DX: Personal history of other medical treatment: Z92.89

## 2020-10-17 HISTORY — DX: Gastro-esophageal reflux disease without esophagitis: K21.9

## 2020-10-17 HISTORY — PX: XI ROBOTIC ASSISTED LAPAROSCOPIC DISTAL PANCREATECTOMY: SHX6539

## 2020-10-17 HISTORY — DX: Polyneuropathy, unspecified: G62.9

## 2020-10-17 HISTORY — DX: Other complications of anesthesia, initial encounter: T88.59XA

## 2020-10-17 LAB — CBC
HCT: 42 % (ref 36.0–46.0)
Hemoglobin: 13.5 g/dL (ref 12.0–15.0)
MCH: 30.4 pg (ref 26.0–34.0)
MCHC: 32.1 g/dL (ref 30.0–36.0)
MCV: 94.6 fL (ref 80.0–100.0)
Platelets: 126 10*3/uL — ABNORMAL LOW (ref 150–400)
RBC: 4.44 MIL/uL (ref 3.87–5.11)
RDW: 13.1 % (ref 11.5–15.5)
WBC: 4.6 10*3/uL (ref 4.0–10.5)
nRBC: 0 % (ref 0.0–0.2)

## 2020-10-17 LAB — BASIC METABOLIC PANEL
Anion gap: 9 (ref 5–15)
BUN: 10 mg/dL (ref 8–23)
CO2: 23 mmol/L (ref 22–32)
Calcium: 9.6 mg/dL (ref 8.9–10.3)
Chloride: 105 mmol/L (ref 98–111)
Creatinine, Ser: 0.69 mg/dL (ref 0.44–1.00)
GFR, Estimated: >60 mL/min (ref >60)
Glucose, Bld: 130 mg/dL — ABNORMAL HIGH (ref 70–99)
Potassium: 3.5 mmol/L (ref 3.5–5.1)
Sodium: 137 mmol/L (ref 135–145)

## 2020-10-17 LAB — GLUCOSE, CAPILLARY
Glucose-Capillary: 140 mg/dL — ABNORMAL HIGH (ref 70–99)
Glucose-Capillary: 146 mg/dL — ABNORMAL HIGH (ref 70–99)
Glucose-Capillary: 192 mg/dL — ABNORMAL HIGH (ref 70–99)

## 2020-10-17 LAB — HEMOGLOBIN A1C
Hgb A1c MFr Bld: 5.9 % — ABNORMAL HIGH (ref 4.8–5.6)
Mean Plasma Glucose: 122.63 mg/dL

## 2020-10-17 LAB — TYPE AND SCREEN
ABO/RH(D): A POS
Antibody Screen: NEGATIVE

## 2020-10-17 LAB — ABO/RH: ABO/RH(D): A POS

## 2020-10-17 SURGERY — PANCREATECTOMY, DISTAL, ROBOT-ASSISTED
Anesthesia: General | Site: Abdomen

## 2020-10-17 MED ORDER — OXYCODONE HCL 5 MG/5ML PO SOLN: 5.0000 mg | Freq: Once | ORAL | Status: DC | PRN

## 2020-10-17 MED ORDER — LACTATED RINGERS IV SOLN: INTRAVENOUS | Status: DC

## 2020-10-17 MED ORDER — PROPOFOL 10 MG/ML IV BOLUS
INTRAVENOUS | Status: AC
  Filled 2020-10-17: qty 20

## 2020-10-17 MED ORDER — DIPHENHYDRAMINE HCL 50 MG/ML IJ SOLN
12.5000 mg | Freq: Four times a day (QID) | INTRAMUSCULAR | Status: DC | PRN
Start: 2020-10-17 — End: 2020-10-23

## 2020-10-17 MED ORDER — SUCCINYLCHOLINE CHLORIDE 200 MG/10ML IV SOSY
PREFILLED_SYRINGE | INTRAVENOUS | Status: AC
  Filled 2020-10-17: qty 10

## 2020-10-17 MED ORDER — METHOCARBAMOL 500 MG PO TABS
500.0000 mg | ORAL_TABLET | Freq: Four times a day (QID) | ORAL | Status: DC | PRN
  Administered 2020-10-17 – 2020-10-18 (×2): 500 mg via ORAL
  Filled 2020-10-17 (×3): qty 1

## 2020-10-17 MED ORDER — OXYCODONE HCL 5 MG PO TABS: 5.0000 mg | ORAL_TABLET | Freq: Once | ORAL | Status: DC | PRN

## 2020-10-17 MED ORDER — CHLORHEXIDINE GLUCONATE 0.12 % MT SOLN
15.0000 mL | Freq: Once | OROMUCOSAL | Status: AC
  Administered 2020-10-17: 15 mL via OROMUCOSAL
  Filled 2020-10-17: qty 15

## 2020-10-17 MED ORDER — CELECOXIB 200 MG PO CAPS
200.0000 mg | ORAL_CAPSULE | Freq: Two times a day (BID) | ORAL | Status: DC | PRN
  Filled 2020-10-17: qty 1

## 2020-10-17 MED ORDER — GABAPENTIN 300 MG PO CAPS
300.0000 mg | ORAL_CAPSULE | Freq: Three times a day (TID) | ORAL | Status: DC | PRN
  Administered 2020-10-17 – 2020-10-21 (×4): 300 mg via ORAL
  Filled 2020-10-17 (×5): qty 1

## 2020-10-17 MED ORDER — MELATONIN 3 MG PO TABS
3.0000 mg | ORAL_TABLET | Freq: Every evening | ORAL | Status: DC | PRN
  Filled 2020-10-17: qty 1

## 2020-10-17 MED ORDER — BUPIVACAINE LIPOSOME 1.3 % IJ SUSP
INTRAMUSCULAR | Status: AC
  Filled 2020-10-17: qty 20

## 2020-10-17 MED ORDER — HYDROCODONE-ACETAMINOPHEN 7.5-325 MG PO TABS
ORAL_TABLET | ORAL | Status: AC
  Filled 2020-10-17: qty 1

## 2020-10-17 MED ORDER — ACETAMINOPHEN 500 MG PO TABS
1000.0000 mg | ORAL_TABLET | ORAL | Status: AC
  Administered 2020-10-17: 1000 mg via ORAL
  Filled 2020-10-17: qty 2

## 2020-10-17 MED ORDER — ONDANSETRON HCL 4 MG/2ML IJ SOLN: 4.0000 mg | Freq: Four times a day (QID) | INTRAMUSCULAR | Status: DC | PRN

## 2020-10-17 MED ORDER — FENTANYL CITRATE (PF) 250 MCG/5ML IJ SOLN
INTRAMUSCULAR | Status: AC
  Filled 2020-10-17: qty 5

## 2020-10-17 MED ORDER — FENTANYL CITRATE (PF) 100 MCG/2ML IJ SOLN
25.0000 ug | INTRAMUSCULAR | Status: DC | PRN
  Administered 2020-10-17 (×3): 50 ug via INTRAVENOUS

## 2020-10-17 MED ORDER — CEFAZOLIN SODIUM-DEXTROSE 2-4 GM/100ML-% IV SOLN
2.0000 g | Freq: Once | INTRAVENOUS | Status: AC
  Administered 2020-10-17: 2 g via INTRAVENOUS
  Filled 2020-10-17 (×2): qty 100

## 2020-10-17 MED ORDER — PHENYLEPHRINE HCL-NACL 10-0.9 MG/250ML-% IV SOLN
INTRAVENOUS | Status: DC | PRN
  Administered 2020-10-17: 40 ug/min via INTRAVENOUS

## 2020-10-17 MED ORDER — FENTANYL CITRATE (PF) 250 MCG/5ML IJ SOLN
INTRAMUSCULAR | Status: DC | PRN
  Administered 2020-10-17: 50 ug via INTRAVENOUS
  Administered 2020-10-17: 25 ug via INTRAVENOUS
  Administered 2020-10-17 (×2): 50 ug via INTRAVENOUS
  Administered 2020-10-17: 25 ug via INTRAVENOUS
  Administered 2020-10-17: 50 ug via INTRAVENOUS
  Administered 2020-10-17: 25 ug via INTRAVENOUS
  Administered 2020-10-17: 50 ug via INTRAVENOUS
  Administered 2020-10-17: 25 ug via INTRAVENOUS
  Administered 2020-10-17: 50 ug via INTRAVENOUS

## 2020-10-17 MED ORDER — ROSUVASTATIN CALCIUM 5 MG PO TABS
10.0000 mg | ORAL_TABLET | Freq: Every day | ORAL | Status: DC
  Administered 2020-10-18 – 2020-10-23 (×6): 10 mg via ORAL
  Filled 2020-10-17 (×7): qty 2

## 2020-10-17 MED ORDER — CHLORHEXIDINE GLUCONATE CLOTH 2 % EX PADS: 6.0000 | MEDICATED_PAD | Freq: Once | CUTANEOUS | Status: DC

## 2020-10-17 MED ORDER — ONDANSETRON HCL 4 MG/2ML IJ SOLN
INTRAMUSCULAR | Status: DC | PRN
  Administered 2020-10-17: 4 mg via INTRAVENOUS

## 2020-10-17 MED ORDER — SODIUM CHLORIDE 0.9 % IR SOLN
Status: DC | PRN
  Administered 2020-10-17: 1000 mL

## 2020-10-17 MED ORDER — LIDOCAINE 2% (20 MG/ML) 5 ML SYRINGE
INTRAMUSCULAR | Status: DC | PRN
  Administered 2020-10-17: 60 mg via INTRAVENOUS

## 2020-10-17 MED ORDER — PROPOFOL 10 MG/ML IV BOLUS
INTRAVENOUS | Status: DC | PRN
  Administered 2020-10-17: 140 mg via INTRAVENOUS

## 2020-10-17 MED ORDER — ROCURONIUM BROMIDE 10 MG/ML (PF) SYRINGE
PREFILLED_SYRINGE | INTRAVENOUS | Status: AC
  Filled 2020-10-17: qty 10

## 2020-10-17 MED ORDER — HYDROCODONE-ACETAMINOPHEN 7.5-325 MG PO TABS
1.0000 | ORAL_TABLET | Freq: Three times a day (TID) | ORAL | Status: DC | PRN
  Administered 2020-10-17 – 2020-10-19 (×5): 1 via ORAL
  Filled 2020-10-17 (×6): qty 1

## 2020-10-17 MED ORDER — ONDANSETRON 4 MG PO TBDP
4.0000 mg | ORAL_TABLET | Freq: Four times a day (QID) | ORAL | Status: DC | PRN
  Filled 2020-10-17: qty 1

## 2020-10-17 MED ORDER — CEFAZOLIN SODIUM-DEXTROSE 2-4 GM/100ML-% IV SOLN
2.0000 g | INTRAVENOUS | Status: AC
  Administered 2020-10-17: 2 g via INTRAVENOUS
  Filled 2020-10-17: qty 100

## 2020-10-17 MED ORDER — ORAL CARE MOUTH RINSE: 15.0000 mL | Freq: Once | OROMUCOSAL | Status: AC

## 2020-10-17 MED ORDER — LIDOCAINE 2% (20 MG/ML) 5 ML SYRINGE
INTRAMUSCULAR | Status: AC
  Filled 2020-10-17: qty 5

## 2020-10-17 MED ORDER — FENTANYL CITRATE (PF) 100 MCG/2ML IJ SOLN
INTRAMUSCULAR | Status: AC
  Filled 2020-10-17: qty 2

## 2020-10-17 MED ORDER — METOPROLOL SUCCINATE 12.5 MG HALF TABLET
12.5000 mg | ORAL_TABLET | Freq: Two times a day (BID) | ORAL | Status: DC
  Administered 2020-10-17 – 2020-10-23 (×12): 12.5 mg via ORAL
  Filled 2020-10-17 (×13): qty 1

## 2020-10-17 MED ORDER — DEXAMETHASONE SODIUM PHOSPHATE 10 MG/ML IJ SOLN
INTRAMUSCULAR | Status: DC | PRN
  Administered 2020-10-17: 4 mg via INTRAVENOUS

## 2020-10-17 MED ORDER — POLYETHYLENE GLYCOL 3350 17 G PO PACK
17.0000 g | PACK | Freq: Every day | ORAL | Status: DC | PRN
  Administered 2020-10-22: 17 g via ORAL
  Filled 2020-10-17: qty 1

## 2020-10-17 MED ORDER — VISTASEAL 10 ML SINGLE DOSE KIT
PACK | CUTANEOUS | Status: DC | PRN
  Administered 2020-10-17: 10 mL via TOPICAL

## 2020-10-17 MED ORDER — SUGAMMADEX SODIUM 200 MG/2ML IV SOLN
INTRAVENOUS | Status: DC | PRN
  Administered 2020-10-17: 200 mg via INTRAVENOUS

## 2020-10-17 MED ORDER — BUPIVACAINE LIPOSOME 1.3 % IJ SUSP
INTRAMUSCULAR | Status: DC | PRN
  Administered 2020-10-17: 20 mL

## 2020-10-17 MED ORDER — ALBUMIN HUMAN 5 % IV SOLN: INTRAVENOUS | Status: DC | PRN

## 2020-10-17 MED ORDER — INSULIN ASPART 100 UNIT/ML ~~LOC~~ SOLN
0.0000 [IU] | Freq: Three times a day (TID) | SUBCUTANEOUS | Status: DC
  Administered 2020-10-17 – 2020-10-21 (×5): 1 [IU] via SUBCUTANEOUS

## 2020-10-17 MED ORDER — BUPIVACAINE HCL (PF) 0.25 % IJ SOLN
INTRAMUSCULAR | Status: AC
  Filled 2020-10-17: qty 30

## 2020-10-17 MED ORDER — 0.9 % SODIUM CHLORIDE (POUR BTL) OPTIME
TOPICAL | Status: DC | PRN
  Administered 2020-10-17 (×2): 1000 mL

## 2020-10-17 MED ORDER — BUPIVACAINE HCL (PF) 0.25 % IJ SOLN
INTRAMUSCULAR | Status: DC | PRN
  Administered 2020-10-17: 20 mL

## 2020-10-17 MED ORDER — ONDANSETRON HCL 4 MG/2ML IJ SOLN
4.0000 mg | Freq: Four times a day (QID) | INTRAMUSCULAR | Status: DC | PRN
  Administered 2020-10-20: 4 mg via INTRAVENOUS
  Filled 2020-10-17 (×3): qty 2

## 2020-10-17 MED ORDER — METHOCARBAMOL 500 MG PO TABS
ORAL_TABLET | ORAL | Status: AC
  Filled 2020-10-17: qty 1

## 2020-10-17 MED ORDER — PANTOPRAZOLE SODIUM 40 MG PO TBEC
40.0000 mg | DELAYED_RELEASE_TABLET | Freq: Every day | ORAL | Status: DC
  Administered 2020-10-18 – 2020-10-23 (×6): 40 mg via ORAL
  Filled 2020-10-17 (×6): qty 1

## 2020-10-17 MED ORDER — PHENYLEPHRINE 40 MCG/ML (10ML) SYRINGE FOR IV PUSH (FOR BLOOD PRESSURE SUPPORT)
PREFILLED_SYRINGE | INTRAVENOUS | Status: DC | PRN
  Administered 2020-10-17 (×2): 80 ug via INTRAVENOUS
  Administered 2020-10-17: 40 ug via INTRAVENOUS

## 2020-10-17 MED ORDER — ROCURONIUM BROMIDE 10 MG/ML (PF) SYRINGE
PREFILLED_SYRINGE | INTRAVENOUS | Status: DC | PRN
  Administered 2020-10-17: 20 mg via INTRAVENOUS
  Administered 2020-10-17: 50 mg via INTRAVENOUS
  Administered 2020-10-17: 10 mg via INTRAVENOUS

## 2020-10-17 MED ORDER — ALPRAZOLAM 0.5 MG PO TABS
0.5000 mg | ORAL_TABLET | Freq: Two times a day (BID) | ORAL | Status: DC | PRN
  Administered 2020-10-17 – 2020-10-22 (×6): 0.5 mg via ORAL
  Filled 2020-10-17 (×7): qty 1

## 2020-10-17 MED ORDER — FENTANYL CITRATE (PF) 100 MCG/2ML IJ SOLN
50.0000 ug | INTRAMUSCULAR | Status: DC | PRN
  Administered 2020-10-17: 50 ug via INTRAVENOUS
  Filled 2020-10-17 (×2): qty 2

## 2020-10-17 MED ORDER — DIPHENHYDRAMINE HCL 12.5 MG/5ML PO ELIX: 12.5000 mg | ORAL_SOLUTION | Freq: Four times a day (QID) | ORAL | Status: DC | PRN

## 2020-10-17 MED ORDER — LACTATED RINGERS IV SOLN: INTRAVENOUS | Status: DC | PRN

## 2020-10-17 MED ORDER — ONDANSETRON HCL 4 MG/2ML IJ SOLN
INTRAMUSCULAR | Status: AC
  Filled 2020-10-17: qty 2

## 2020-10-17 SURGICAL SUPPLY — 146 items
ADH SKN CLS APL DERMABOND .7 (GAUZE/BANDAGES/DRESSINGS) ×2
APL LAPSCP 35 DL APL RGD (MISCELLANEOUS) ×2
APL PRP STRL LF DISP 70% ISPRP (MISCELLANEOUS) ×2
APPLICATOR VISTASEAL 35 (MISCELLANEOUS) ×2 IMPLANT
BAG SPEC RTRVL 10 TROC 200 (ENDOMECHANICALS)
BLADE CLIPPER SURG (BLADE) IMPLANT
BLADE EXTENDED COATED 6.5IN (ELECTRODE) IMPLANT
CANISTER SUCT 3000ML PPV (MISCELLANEOUS) ×3 IMPLANT
CANNULA REDUC XI 12-8 STAPL (CANNULA) ×3
CANNULA REDUCER 12-8 DVNC XI (CANNULA) ×2 IMPLANT
CATH KIT ON-Q SILVERSOAK 7.5 (CATHETERS) ×2 IMPLANT
CATH KIT ON-Q SILVERSOAK 7.5IN (CATHETERS) ×6 IMPLANT
CELLS DAT CNTRL 66122 CELL SVR (MISCELLANEOUS) IMPLANT
CHLORAPREP W/TINT 26 (MISCELLANEOUS) ×3 IMPLANT
CLIP VESOCCLUDE LG 6/CT (CLIP) IMPLANT
CLIP VESOLOCK LG 6/CT PURPLE (CLIP) IMPLANT
CLIP VESOLOCK MED 6/CT (CLIP) IMPLANT
COVER MAYO STAND STRL (DRAPES) ×1 IMPLANT
COVER SURGICAL LIGHT HANDLE (MISCELLANEOUS) ×3 IMPLANT
COVER TIP SHEARS 8 DVNC (MISCELLANEOUS) IMPLANT
COVER TIP SHEARS 8MM DA VINCI (MISCELLANEOUS)
COVER WAND RF STERILE (DRAPES) ×1 IMPLANT
DECANTER SPIKE VIAL GLASS SM (MISCELLANEOUS) ×6 IMPLANT
DEFOGGER SCOPE WARMER CLEARIFY (MISCELLANEOUS) ×3 IMPLANT
DERMABOND ADVANCED (GAUZE/BANDAGES/DRESSINGS) ×1
DERMABOND ADVANCED .7 DNX12 (GAUZE/BANDAGES/DRESSINGS) ×2 IMPLANT
DEVICE TROCAR PUNCTURE CLOSURE (ENDOMECHANICALS) IMPLANT
DRAIN CHANNEL 19F RND (DRAIN) ×1 IMPLANT
DRAPE ARM DVNC X/XI (DISPOSABLE) ×8 IMPLANT
DRAPE COLUMN DVNC XI (DISPOSABLE) ×2 IMPLANT
DRAPE CV SPLIT W-CLR ANES SCRN (DRAPES) ×3 IMPLANT
DRAPE DA VINCI XI ARM (DISPOSABLE) ×12
DRAPE DA VINCI XI COLUMN (DISPOSABLE) ×3
DRAPE LAPAROSCOPIC ABDOMINAL (DRAPES) ×1 IMPLANT
DRAPE ORTHO SPLIT 77X108 STRL (DRAPES) ×3
DRAPE SURG IRRIG POUCH 19X23 (DRAPES) ×3 IMPLANT
DRAPE SURG ORHT 6 SPLT 77X108 (DRAPES) ×2 IMPLANT
DRAPE WARM FLUID 44X44 (DRAPES) ×3 IMPLANT
DRSG OPSITE POSTOP 4X10 (GAUZE/BANDAGES/DRESSINGS) ×1 IMPLANT
DRSG OPSITE POSTOP 4X6 (GAUZE/BANDAGES/DRESSINGS) IMPLANT
DRSG OPSITE POSTOP 4X8 (GAUZE/BANDAGES/DRESSINGS) IMPLANT
ELECT BLADE 6.5 EXT (BLADE) IMPLANT
ELECT REM PT RETURN 9FT ADLT (ELECTROSURGICAL) ×3
ELECTRODE REM PT RTRN 9FT ADLT (ELECTROSURGICAL) ×2 IMPLANT
ENDOLOOP SUT PDS II  0 18 (SUTURE)
ENDOLOOP SUT PDS II 0 18 (SUTURE) IMPLANT
EVACUATOR SILICONE 100CC (DRAIN) ×1 IMPLANT
GAUZE SPONGE 4X4 12PLY STRL (GAUZE/BANDAGES/DRESSINGS) ×6 IMPLANT
GLOVE BIO SURGEON STRL SZ 6 (GLOVE) ×5 IMPLANT
GLOVE SURG UNDER LTX SZ6.5 (GLOVE) ×7 IMPLANT
GOWN STRL REUS W/ TWL LRG LVL3 (GOWN DISPOSABLE) ×4 IMPLANT
GOWN STRL REUS W/ TWL XL LVL3 (GOWN DISPOSABLE) ×4 IMPLANT
GOWN STRL REUS W/TWL 2XL LVL3 (GOWN DISPOSABLE) ×9 IMPLANT
GOWN STRL REUS W/TWL LRG LVL3 (GOWN DISPOSABLE) ×6
GOWN STRL REUS W/TWL XL LVL3 (GOWN DISPOSABLE) ×6
HANDLE SUCTION POOLE (INSTRUMENTS) ×1 IMPLANT
HEMOSTAT SURGICEL 2X14 (HEMOSTASIS) IMPLANT
KIT BASIN OR (CUSTOM PROCEDURE TRAY) ×3 IMPLANT
KIT TURNOVER KIT B (KITS) ×3 IMPLANT
L-HOOK LAP DISP 36CM (ELECTROSURGICAL)
LHOOK LAP DISP 36CM (ELECTROSURGICAL) ×1 IMPLANT
NDL INSUFFLATION 14GA 120MM (NEEDLE) ×1 IMPLANT
NEEDLE 22X1 1/2 (OR ONLY) (NEEDLE) ×3 IMPLANT
NEEDLE INSUFFLATION 14GA 120MM (NEEDLE) ×3 IMPLANT
NS IRRIG 1000ML POUR BTL (IV SOLUTION) ×6 IMPLANT
OBTURATOR OPTICAL STANDARD 8MM (TROCAR)
OBTURATOR OPTICAL STND 8 DVNC (TROCAR)
OBTURATOR OPTICALSTD 8 DVNC (TROCAR) IMPLANT
PACK GENERAL/GYN (CUSTOM PROCEDURE TRAY) ×3 IMPLANT
PAD ARMBOARD 7.5X6 YLW CONV (MISCELLANEOUS) ×6 IMPLANT
PENCIL BUTTON HOLSTER BLD 10FT (ELECTRODE) ×1 IMPLANT
PENCIL SMOKE EVACUATOR (MISCELLANEOUS) ×3 IMPLANT
PORT LAP GEL ALEXIS MED 5-9CM (MISCELLANEOUS) ×2 IMPLANT
POUCH RETRIEVAL ECOSAC 10 (ENDOMECHANICALS) ×1 IMPLANT
POUCH RETRIEVAL ECOSAC 10MM (ENDOMECHANICALS)
RELOAD 45 VASCULAR/THIN (ENDOMECHANICALS) IMPLANT
RELOAD STAPLE 45 2.5 WHT GRN (ENDOMECHANICALS) IMPLANT
RELOAD STAPLE 60 2.5 WHT DVNC (STAPLE) IMPLANT
RELOAD STAPLE 60 3.5 BLU DVNC (STAPLE) IMPLANT
RELOAD STAPLE 60 4.3 GRN DVNC (STAPLE) IMPLANT
RELOAD STAPLER 2.5X60 WHT DVNC (STAPLE) ×4 IMPLANT
RELOAD STAPLER 3.5X60 BLU DVNC (STAPLE) IMPLANT
RELOAD STAPLER 4.3X60 GRN DVNC (STAPLE) ×2 IMPLANT
RETRACTOR WND ALEXIS 18 MED (MISCELLANEOUS) IMPLANT
RTRCTR WOUND ALEXIS 18CM MED (MISCELLANEOUS)
SCISSORS LAP 5X35 DISP (ENDOMECHANICALS) IMPLANT
SEAL CANN UNIV 5-8 DVNC XI (MISCELLANEOUS) ×7 IMPLANT
SEAL XI 5MM-8MM UNIVERSAL (MISCELLANEOUS) ×12
SEALER VESSEL DA VINCI XI (MISCELLANEOUS) ×3
SEALER VESSEL EXT DVNC XI (MISCELLANEOUS) ×2 IMPLANT
SET IRRIG TUBING LAPAROSCOPIC (IRRIGATION / IRRIGATOR) ×3 IMPLANT
SET TUBE SMOKE EVAC HIGH FLOW (TUBING) ×3 IMPLANT
SHEARS FOC LG CVD HARMONIC 17C (MISCELLANEOUS) ×3 IMPLANT
SLEEVE ENDOPATH XCEL 5M (ENDOMECHANICALS) ×1 IMPLANT
SLEEVE XCEL OPT CAN 5 100 (ENDOMECHANICALS) ×2 IMPLANT
SPECIMEN JAR X LARGE (MISCELLANEOUS) ×3 IMPLANT
SPONGE INTESTINAL PEANUT (DISPOSABLE) IMPLANT
SPONGE LAP 18X18 RF (DISPOSABLE) ×2 IMPLANT
STAPLE ECHEON FLEX 60 POW ENDO (STAPLE) ×3 IMPLANT
STAPLER 60 DA VINCI SURE FORM (STAPLE) ×3
STAPLER 60 SUREFORM DVNC (STAPLE) ×2 IMPLANT
STAPLER CANNULA SEAL DVNC XI (STAPLE) ×1 IMPLANT
STAPLER CANNULA SEAL XI (STAPLE) ×3
STAPLER RELOAD 2.5X60 WHITE (STAPLE) ×6
STAPLER RELOAD 2.5X60 WHT DVNC (STAPLE) ×4
STAPLER RELOAD 3.5X60 BLU DVNC (STAPLE)
STAPLER RELOAD 3.5X60 BLUE (STAPLE)
STAPLER RELOAD 4.3X60 GREEN (STAPLE) ×3
STAPLER RELOAD 4.3X60 GRN DVNC (STAPLE) ×2
STAPLER VISISTAT 35W (STAPLE) ×1 IMPLANT
STOPCOCK 4 WAY LG BORE MALE ST (IV SETS) ×3 IMPLANT
SUCTION POOLE HANDLE (INSTRUMENTS) ×3
SUT DVC VLOC 180 2-0 12IN GS21 (SUTURE)
SUT ETHILON 2 0 FS 18 (SUTURE) ×1 IMPLANT
SUT MNCRL AB 4-0 PS2 18 (SUTURE) ×5 IMPLANT
SUT NOVA 1 T20/GS 25DT (SUTURE) IMPLANT
SUT NOVA NAB DX-16 0-1 5-0 T12 (SUTURE) ×1 IMPLANT
SUT PDS AB 0 CTX 60 (SUTURE) ×2 IMPLANT
SUT PDS AB 1 CTX 36 (SUTURE) IMPLANT
SUT PDS AB 2-0 CT2 27 (SUTURE) IMPLANT
SUT PDS II 0 TP-1 LOOPED 60 (SUTURE) ×2 IMPLANT
SUT PROLENE 2 0 SH DA (SUTURE) IMPLANT
SUT SILK 2 0 (SUTURE)
SUT SILK 2 0 REEL (SUTURE) IMPLANT
SUT SILK 2 0 SH (SUTURE) ×3 IMPLANT
SUT SILK 2 0 SH CR/8 (SUTURE) ×6 IMPLANT
SUT SILK 2 0 TIES 10X30 (SUTURE) ×3 IMPLANT
SUT SILK 2-0 18XBRD TIE 12 (SUTURE) IMPLANT
SUT SILK 3 0 (SUTURE)
SUT SILK 3 0 SH CR/8 (SUTURE) IMPLANT
SUT SILK 3-0 18XBRD TIE 12 (SUTURE) IMPLANT
SUT VIC AB 3-0 SH 8-18 (SUTURE) ×2 IMPLANT
SUT VICRYL 0 TIES 12 18 (SUTURE) IMPLANT
SUTURE DVC VL 180 2-0 12INGS21 (SUTURE) IMPLANT
TIP RIGID 35CM EVICEL (HEMOSTASIS) ×3 IMPLANT
TOWEL GREEN STERILE (TOWEL DISPOSABLE) ×3 IMPLANT
TOWEL GREEN STERILE FF (TOWEL DISPOSABLE) ×3 IMPLANT
TRAY FOLEY MTR SLVR 14FR STAT (SET/KITS/TRAYS/PACK) ×1 IMPLANT
TRAY FOLEY MTR SLVR 16FR STAT (SET/KITS/TRAYS/PACK) ×3 IMPLANT
TRAY LAPAROSCOPIC MC (CUSTOM PROCEDURE TRAY) ×1 IMPLANT
TROCAR ADV FIXATION 5X100MM (TROCAR) ×3 IMPLANT
TROCAR XCEL BLUNT TIP 100MML (ENDOMECHANICALS) IMPLANT
TROCAR XCEL NON-BLD 11X100MML (ENDOMECHANICALS) IMPLANT
TROCAR XCEL NON-BLD 5MMX100MML (ENDOMECHANICALS) ×1 IMPLANT
TUNNELER SHEATH ON-Q 16GX12 DP (PAIN MANAGEMENT) ×3 IMPLANT
WARMER LAPAROSCOPE (MISCELLANEOUS) ×1 IMPLANT

## 2020-10-17 NOTE — Anesthesia Procedure Notes (Signed)
Procedure Name: Intubation Date/Time: 10/17/2020 9:08 AM Performed by: Imagene Riches, CRNA Pre-anesthesia Checklist: Patient identified, Emergency Drugs available, Suction available and Patient being monitored Patient Re-evaluated:Patient Re-evaluated prior to induction Oxygen Delivery Method: Circle System Utilized Preoxygenation: Pre-oxygenation with 100% oxygen Induction Type: IV induction Ventilation: Mask ventilation without difficulty Laryngoscope Size: Mac and 3 Grade View: Grade I Tube type: Oral Tube size: 7.0 mm Number of attempts: 1 Airway Equipment and Method: Stylet and Oral airway Placement Confirmation: ETT inserted through vocal cords under direct vision,  positive ETCO2 and breath sounds checked- equal and bilateral Secured at: 21 cm Tube secured with: Tape Dental Injury: Teeth and Oropharynx as per pre-operative assessment

## 2020-10-17 NOTE — Transfer of Care (Signed)
Immediate Anesthesia Transfer of Care Note  Patient: DANICKA HOURIHAN  Procedure(s) Performed: XI ROBOTIC ASSISTED DISTAL PANCREATECTOMY AND SPLENECTOMY (N/A Abdomen)  Patient Location: PACU  Anesthesia Type:General  Level of Consciousness: awake and alert   Airway & Oxygen Therapy: Patient Spontanous Breathing and Patient connected to face mask oxygen  Post-op Assessment: Report given to RN and Post -op Vital signs reviewed and stable  Post vital signs: Reviewed and stable  Last Vitals:  Vitals Value Taken Time  BP 166/73   Temp    Pulse 78 10/17/20 1248  Resp 14 10/17/20 1248  SpO2 96 % 10/17/20 1248  Vitals shown include unvalidated device data.  Last Pain:  Vitals:   10/17/20 0702  TempSrc: Oral  PainSc:          Complications: No complications documented.

## 2020-10-17 NOTE — Interval H&P Note (Signed)
History and Physical Interval Note:  10/17/2020 8:51 AM  Amanda Arnold  has presented today for surgery, with the diagnosis of PANCREATIC MASS.  The various methods of treatment have been discussed with the patient and family. After consideration of risks, benefits and other options for treatment, the patient has consented to  Procedure(s): LAPAROSCOPY DIAGNOSTIC (N/A) XI ROBOTIC ASSISTED LAPAROSCOPIC DISTAL PANCREATECTOMY (N/A) SPLENECTOMY (N/A) as a surgical intervention.  The patient's history has been reviewed, patient examined, no change in status, stable for surgery.  I have reviewed the patient's chart and labs.  Questions were answered to the patient's satisfaction.     [name]

## 2020-10-17 NOTE — Anesthesia Procedure Notes (Signed)
Arterial Line Insertion Performed by: Oleta Mouse, MD, anesthesiologist  Patient location: Pre-op. Preanesthetic checklist: patient identified, IV checked, site marked, risks and benefits discussed, surgical consent, monitors and equipment checked, pre-op evaluation, timeout performed and anesthesia consent Lidocaine 1% used for infiltration Left, radial was placed Catheter size: 20 Fr Hand hygiene performed  and maximum sterile barriers used   Attempts: 1 Procedure performed using ultrasound guided technique. Ultrasound Notes:anatomy identified, needle tip was noted to be adjacent to the nerve/plexus identified and no ultrasound evidence of intravascular and/or intraneural injection Following insertion, dressing applied. Post procedure assessment: normal and unchanged  Patient tolerated the procedure well with no immediate complications. Additional procedure comments: Attempts x2 by CRNA and SRNA.

## 2020-10-17 NOTE — Op Note (Signed)
PREOPERATIVE DIAGNOSIS:  Pancreatic tail mass     POSTOPERATIVE DIAGNOSIS:  Same      PROCEDURE:  Robotic distal pancreatectomy and splenectomy  SURGEON:  [name], MD      ASSISTANT:  Pryor Curia, RNFA     ANESTHESIA:  General and local.      FINDINGS: No evidence of metastatic disease Firm mass at tail of pancreas      SPECIMEN:  Distal pancreas and spleen to Pathology.      ESTIMATED BLOOD LOSS:  025 mL       COMPLICATIONS:  None known.      PROCEDURE:  Patient was identified in the holding area and taken to   operating room where she was placed supine on the operating room table.   General anesthesia was induced.  Foley catheter was placed. The abdomen was prepped and   draped in sterile fashion.  Time-out was performed according to surgical   safety check list.  When all was correct, we continued.    A veress needle was placed at the left subcostal region and was used to insufflate the abdomen.     Four robotic trocars were placed in a crescent in the left and right abdomen.  A 5 mm trocar was placed in the RUQ.   The lesser sac was opened with the Vessel sealer and all the adhesions were taken down.  The pancreas was dissected away from the retroperitoneum at the inferior border.  I was able to get underneath the pancreas and separate it off the splenic vessels.     The pancreas was then isolated off  the vessels and the vessels were divided with the white robotic staple load.  The pancreas was stapled with 2 fires of the robotic stapler.  The spleen was then dissected away from the retroperitoneum with the vessel sealer.   The robot was undocked.     The 12 mm port was upsized to a 6 cm extraction port in the lower midline.  The alexis wound protector was placed.  The specimen was removed and there was a reasonable margin from the tumor.  The 42 Blake drain was then passed into the abdomen through a medial port and pulled out through the left most trocar.  This was placed  in the appropriate location.    The abdomen was reinsufflated and examined for any evidence of bleeding. Vistaseal was placed over the pancreatic stump.  The extraction port fascia was closed with 0-0 looped PDS suture.  Exparel was placed into the abdominal wall of this incision.  The skin of the extraction port was closed with 3-0 vicryl and 4-0 monocryl.  The rest of the trocar sites were then closed with 4-0 Monocryl in a subcuticular fashion and then dressed with dermabond.    The patient tolerated the procedure well.  She was extubated and taken to the PACU in stable   condition.  Needle, sponge, and instrument counts were correct x2               [name], MD

## 2020-10-18 ENCOUNTER — Encounter (HOSPITAL_COMMUNITY): Payer: Self-pay | Admitting: General Surgery

## 2020-10-18 LAB — BASIC METABOLIC PANEL
Anion gap: 6 (ref 5–15)
BUN: 6 mg/dL — ABNORMAL LOW (ref 8–23)
CO2: 26 mmol/L (ref 22–32)
Calcium: 8.6 mg/dL — ABNORMAL LOW (ref 8.9–10.3)
Chloride: 105 mmol/L (ref 98–111)
Creatinine, Ser: 0.67 mg/dL (ref 0.44–1.00)
GFR, Estimated: >60 mL/min (ref >60)
Glucose, Bld: 182 mg/dL — ABNORMAL HIGH (ref 70–99)
Potassium: 3.4 mmol/L — ABNORMAL LOW (ref 3.5–5.1)
Sodium: 137 mmol/L (ref 135–145)

## 2020-10-18 LAB — GLUCOSE, CAPILLARY
Glucose-Capillary: 113 mg/dL — ABNORMAL HIGH (ref 70–99)
Glucose-Capillary: 141 mg/dL — ABNORMAL HIGH (ref 70–99)
Glucose-Capillary: 147 mg/dL — ABNORMAL HIGH (ref 70–99)
Glucose-Capillary: 139 mg/dL — ABNORMAL HIGH (ref 70–99)

## 2020-10-18 LAB — CBC
HCT: 33.8 % — ABNORMAL LOW (ref 36.0–46.0)
Hemoglobin: 11.1 g/dL — ABNORMAL LOW (ref 12.0–15.0)
MCH: 31 pg (ref 26.0–34.0)
MCHC: 32.8 g/dL (ref 30.0–36.0)
MCV: 94.4 fL (ref 80.0–100.0)
Platelets: 171 10*3/uL (ref 150–400)
RBC: 3.58 MIL/uL — ABNORMAL LOW (ref 3.87–5.11)
RDW: 13 % (ref 11.5–15.5)
WBC: 11.6 10*3/uL — ABNORMAL HIGH (ref 4.0–10.5)
nRBC: 0 % (ref 0.0–0.2)

## 2020-10-18 LAB — MAGNESIUM: Magnesium: 1.7 mg/dL (ref 1.7–2.4)

## 2020-10-18 LAB — PHOSPHORUS: Phosphorus: 3.3 mg/dL (ref 2.5–4.6)

## 2020-10-18 MED ORDER — CHLORHEXIDINE GLUCONATE CLOTH 2 % EX PADS
6.0000 | MEDICATED_PAD | Freq: Every day | CUTANEOUS | Status: DC
  Administered 2020-10-18 – 2020-10-23 (×5): 6 via TOPICAL

## 2020-10-18 MED ORDER — ACETAMINOPHEN 325 MG PO TABS
650.0000 mg | ORAL_TABLET | Freq: Four times a day (QID) | ORAL | Status: DC | PRN
  Administered 2020-10-18: 650 mg via ORAL
  Filled 2020-10-18: qty 2

## 2020-10-18 MED ORDER — METHOCARBAMOL 1000 MG/10ML IJ SOLN
500.0000 mg | Freq: Three times a day (TID) | INTRAVENOUS | Status: DC | PRN
  Administered 2020-10-19 – 2020-10-21 (×2): 500 mg via INTRAVENOUS
  Filled 2020-10-18 (×3): qty 5

## 2020-10-18 MED ORDER — ENOXAPARIN SODIUM 40 MG/0.4ML ~~LOC~~ SOLN
40.0000 mg | Freq: Every day | SUBCUTANEOUS | Status: DC
  Administered 2020-10-18 – 2020-10-23 (×6): 40 mg via SUBCUTANEOUS
  Filled 2020-10-18 (×6): qty 0.4

## 2020-10-18 MED ORDER — GLUCERNA SHAKE PO LIQD
237.0000 mL | Freq: Three times a day (TID) | ORAL | Status: DC
  Administered 2020-10-18 – 2020-10-23 (×11): 237 mL via ORAL

## 2020-10-18 MED ORDER — HYDROMORPHONE HCL 1 MG/ML IJ SOLN
0.5000 mg | INTRAMUSCULAR | Status: DC | PRN
  Administered 2020-10-18 – 2020-10-21 (×7): 1 mg via INTRAVENOUS
  Filled 2020-10-18 (×8): qty 1

## 2020-10-18 MED ORDER — SODIUM CHLORIDE 0.9 % IV BOLUS
500.0000 mL | Freq: Once | INTRAVENOUS | Status: AC
  Administered 2020-10-18: 500 mL via INTRAVENOUS

## 2020-10-18 NOTE — Progress Notes (Signed)
1 Day Post-Op   Subjective/Chief Complaint: Pt denies n/v.  She is having quite a lot of pain, however.  This is worse near and just below her belly button.     Objective: Vital signs in last 24 hours: Temp:  [97.5 F (36.4 C)-100.1 F (37.8 C)] 100.1 F (37.8 C) (04/07 1028) Pulse Rate:  [68-90] 70 (04/07 1028) Resp:  [11-18] 16 (04/07 1028) BP: (116-166)/(55-73) 124/61 (04/07 1028) SpO2:  [92 %-96 %] 96 % (04/07 1028) Last BM Date:  (PTA)  Intake/Output from previous day: 04/06 0701 - 04/07 0700 In: 2620 [P.O.:120; I.V.:2000; IV Piggyback:350] Out: 1395 [Urine:1105; Drains:90; Blood:200] Intake/Output this shift: Total I/O In: 1853.1 [I.V.:1853.1] Out: -   General appearance: alert, cooperative and moderate distress Resp: breathing comfortably GI: soft, non distended, approp tender Extremities: extremities normal, atraumatic, no cyanosis or edema  Lab Results:  Recent Labs    10/17/20 0641 10/18/20 0208  WBC 4.6 11.6*  HGB 13.5 11.1*  HCT 42.0 33.8*  PLT 126* 171   BMET Recent Labs    10/17/20 0641 10/18/20 0208  NA 137 137  K 3.5 3.4*  CL 105 105  CO2 23 26  GLUCOSE 130* 182*  BUN 10 6*  CREATININE 0.69 0.67  CALCIUM 9.6 8.6*   PT/INR No results for input(s): LABPROT, INR in the last 72 hours. ABG No results for input(s): PHART, HCO3 in the last 72 hours.  Invalid input(s): PCO2, PO2  Studies/Results: No results found.  Anti-infectives: Anti-infectives (From admission, onward)   Start     Dose/Rate Route Frequency Ordered Stop   10/17/20 1500  ceFAZolin (ANCEF) IVPB 2g/100 mL premix        2 g 200 mL/hr over 30 Minutes Intravenous  Once 10/17/20 1411 10/18/20 0713   10/17/20 0700  ceFAZolin (ANCEF) IVPB 2g/100 mL premix        2 g 200 mL/hr over 30 Minutes Intravenous On call to O.R. 10/17/20 0646 10/17/20 1300      Assessment/Plan: s/p Procedure(s): XI ROBOTIC ASSISTED DISTAL PANCREATECTOMY AND SPLENECTOMY (N/A) d/c foley change to  dilaudid for pain control.   Have continued home medications like 7.5 mg norco, celebrex, and gabapentin TID.  Switch robaxin to IV. Await pathology - adenocarcinoma suspected based on appearance on scan, but prior biopsy was non diagnostic.   Pulmonary toilet.   ABL anemia  VTE ppx - start lovenox   LOS: 1 day    [name] 10/18/2020

## 2020-10-18 NOTE — Evaluation (Signed)
Physical Therapy Treatment Patient Details Name: Amanda Arnold MRN: [id number] DOB: [birth year] Today's Date: 10/18/2020    History of Present Illness 75 yo female with onset of abd pain was admitted for pancreatectomy and splenectomy on 4/6 after finding a pancreatic mass in 2021.  Pt is referred to PT for mobility due to recent surgery and wgt loss with increased weakness.  PMHx:  pancreatic mass, pulmonary nodule, R kidney stone, hepatic steatosis,    PT Comments    Pt was seen for mobility on RW with help to stand and get OOB.  Pt is in a lot of pain and is not tolerating much yet, but with standing rests could walk in her room  Pt is demonstrating good LE strength and her challenge with gait is apparently only from pain.  Follow for acute PT goals and plan to try stairs ASAP to get home with her husband.   Follow Up Recommendations  Home health PT;Supervision for mobility/OOB     Equipment Recommendations  Rolling walker with 5" wheels    Recommendations for Other Services       Precautions / Restrictions Precautions Precautions: Fall Precaution Comments: abd incision Restrictions Weight Bearing Restrictions: No    Mobility  Bed Mobility Overal bed mobility: Needs Assistance Bed Mobility: Supine to Sit;Sit to Supine     Supine to sit: Mod assist Sit to supine: Mod assist   General bed mobility comments: assisted her to roll and lift trunk to get up then to reverse the sequence back    Transfers Overall transfer level: Needs assistance Equipment used: Rolling walker (2 wheeled) Transfers: Sit to/from Stand Sit to Stand: Min assist            Ambulation/Gait Ambulation/Gait assistance: Min assist Gait Distance (Feet): 40 Feet Assistive device: Rolling walker (2 wheeled);1 person hand held assist Gait Pattern/deviations: Step-through pattern;Decreased stride length;Wide base of support;Trunk flexed Gait velocity: controlled Gait velocity interpretation:  <1.31 ft/sec, indicative of household ambulator General Gait Details: slow paced careful walk with brief stops for buckling feelings in legs   Stairs             Wheelchair Mobility    Modified Rankin (Stroke Patients Only)       Balance Overall balance assessment: Needs assistance   Sitting balance-Leahy Scale: Fair     Standing balance support: Bilateral upper extremity supported;During functional activity Standing balance-Leahy Scale: Poor Standing balance comment: balance related to pain                            Cognition Arousal/Alertness: Awake/alert Behavior During Therapy: WFL for tasks assessed/performed Overall Cognitive Status: Within Functional Limits for tasks assessed                                        Exercises      General Comments General comments (skin integrity, edema, etc.): Pt is up to walk with help, taking her time and monitoring for wobbly leg feeling.  Requires RW adn min assist to control balance with rest stops briefly      Pertinent Vitals/Pain Pain Assessment: Faces Faces Pain Scale: Hurts even more Pain Location: abdomen during mobiliy Pain Descriptors / Indicators: Guarding;Grimacing Pain Intervention(s): Premedicated before session;Monitored during session;Limited activity within patient's tolerance;Repositioned    Home Living Family/patient expects to be discharged to:: Private residence  Living Arrangements: Spouse/significant other Available Help at Discharge: Family;Available PRN/intermittently;Available 24 hours/day Type of Home: House Home Access: Stairs to enter Entrance Stairs-Rails: None Home Layout: One level Home Equipment: None Additional Comments: was indepenent walking and able to do her housework prior to mass discovery    Prior Function Level of Independence: Independent          PT Goals (current goals can now be found in the care plan section) Acute Rehab PT  Goals Patient Stated Goal: to go home with help PT Goal Formulation: With patient/family Time For Goal Achievement: 11/01/20 Potential to Achieve Goals: Good    Frequency    Min 3X/week      PT Plan      Co-evaluation              AM-PAC PT "6 Clicks" Mobility   Outcome Measure  Help needed turning from your back to your side while in a flat bed without using bedrails?: A Little Help needed moving from lying on your back to sitting on the side of a flat bed without using bedrails?: A Lot Help needed moving to and from a bed to a chair (including a wheelchair)?: A Lot Help needed standing up from a chair using your arms (e.g., wheelchair or bedside chair)?: A Little Help needed to walk in hospital room?: A Little Help needed climbing 3-5 steps with a railing? : A Lot 6 Click Score: 15    End of Session   Activity Tolerance: Patient tolerated treatment well;Patient limited by fatigue;Patient limited by pain Patient left: in bed;with call bell/phone within reach;with bed alarm set;with family/visitor present Nurse Communication: Mobility status PT Visit Diagnosis: Unsteadiness on feet (R26.81);Other abnormalities of gait and mobility (R26.89);Pain Pain - Right/Left:  (abdomen) Pain - part of body:  (abdomen)     Time: 0938-1829 PT Time Calculation (min) (ACUTE ONLY): 23 min  Charges:  $Gait Training: 8-22 mins               Ramond Dial 10/18/2020, 8:36 PM Mee Hives, PT MS Acute Rehab Dept. Number: [place] and [place]

## 2020-10-18 NOTE — Anesthesia Postprocedure Evaluation (Signed)
Anesthesia Post Note  Patient: Amanda Arnold  Procedure(s) Performed: XI ROBOTIC ASSISTED DISTAL PANCREATECTOMY AND SPLENECTOMY (N/A Abdomen)     Patient location during evaluation: PACU Anesthesia Type: General Level of consciousness: awake and alert Pain management: pain level controlled Vital Signs Assessment: post-procedure vital signs reviewed and stable Respiratory status: spontaneous breathing, nonlabored ventilation, respiratory function stable and patient connected to nasal cannula oxygen Cardiovascular status: blood pressure returned to baseline and stable Postop Assessment: no apparent nausea or vomiting Anesthetic complications: no   No complications documented.  Last Vitals:  Vitals:   10/18/20 0217 10/18/20 0556  BP: (!) 116/55 (!) 118/57  Pulse: 79 68  Resp: 15 16  Temp: 37.2 C 37.4 C  SpO2: 94% 95%    Last Pain:  Vitals:   10/18/20 0556  TempSrc: Oral  PainSc:                  [place]

## 2020-10-18 NOTE — Progress Notes (Signed)
   10/18/20 2042  Assess: MEWS Score  Temp (!) 103.2 F (39.6 C)  BP 131/65  Pulse Rate (!) 110  Resp 18  Level of Consciousness Alert  SpO2 94 %  Patient Activity (if Appropriate) In bed  O2 Flow Rate (L/min) 2 L/min  Assess: MEWS Score  MEWS Temp 2  MEWS Systolic 0  MEWS Pulse 1  MEWS RR 0  MEWS LOC 0  MEWS Score 3  MEWS Score Color Yellow  Assess: if the MEWS score is Yellow or Red  Were vital signs taken at a resting state? Yes  Focused Assessment No change from prior assessment  Early Detection of Sepsis Score *See Row Information* Medium  MEWS guidelines implemented *See Row Information* Yes  Treat  Pain Scale 0-10  Pain Score 5  Pain Type Surgical pain  Pain Location Abdomen  Pain Orientation Lower  Pain Descriptors / Indicators Tender  Pain Onset On-going  Pain Intervention(s) Repositioned;Relaxation  Take Vital Signs  Increase Vital Sign Frequency  Yellow: Q 2hr X 2 then Q 4hr X 2, if remains yellow, continue Q 4hrs  Escalate  MEWS: Escalate Yellow: discuss with charge nurse/RN and consider discussing with provider and RRT  Notify: Charge Nurse/RN  Name of Charge Nurse/RN Notified Nancy,RN  Date Charge Nurse/RN Notified 10/18/20  Time Charge Nurse/RN Notified 2051  Notify: Provider  Provider Name/Title Dr Redmond Pulling  Date Provider Notified 10/18/20  Time Provider Notified 2052  Notification Type Page  Notification Reason Change in status  Provider response See new orders  Date of Provider Response 10/18/20  Time of Provider Response 2119

## 2020-10-18 NOTE — Progress Notes (Addendum)
Initial Nutrition Assessment  DOCUMENTATION CODES:  Non-severe (moderate) malnutrition in context of chronic illness  INTERVENTION:  Recommend continuing to advance diet as able per MD  Glucerna Shake po TID, each supplement provides 220 kcal and 10 grams of protein  NUTRITION DIAGNOSIS:  Moderate Malnutrition related to chronic illness as evidenced by mild muscle depletion,mild fat depletion,percent weight loss (10.8% x 4 months).  GOAL:  Patient will meet greater than or equal to 90% of their needs  MONITOR:  PO intake,Diet advancement,Supplement acceptance  REASON FOR ASSESSMENT:  Malnutrition Screening Tool    ASSESSMENT:  Pt admitted for planned pancreatectomy and splenectomy after a mass was seen on the CT in fall 2021. PMH relevant for HLD, HTN, COPD, T2DM, GERD. COVID19 infection in January 2022  Pt resting in bed at the time of visit, family at bedside. Pt reports that she had tolerated clear liquids well and RN is to advance her to full liquids for dinner. Discussed recent weight loss seen in hx. 10.8% x 4 months (December-April). Pt reports that she usually weighs 198 lb but contracted COVID19 at the end of December and lost a significant amount of weight while she was sick. Pt reports that she has a hard time gaining the weight back, but that she is not feeling weak or fatigued. Pt reports eating two meals a day and 2 snacks. Also consumes ~2 ensures per day. Prefers chocolate. At baseline, pt lives at home with her husband. States that husband primarily does the cooking and shopping.   Medications reviewed and include: insulin Labs reviewed: K 3.4  NUTRITION - FOCUSED PHYSICAL EXAM: Flowsheet Row Most Recent Value  Orbital Region Mild depletion  Upper Arm Region No depletion  Thoracic and Lumbar Region No depletion  Buccal Region No depletion  Temple Region No depletion  Clavicle Bone Region No depletion  Clavicle and Acromion Bone Region Mild depletion  Scapular  Bone Region No depletion  Dorsal Hand No depletion  Patellar Region No depletion  Anterior Thigh Region Mild depletion  Posterior Calf Region Mild depletion  Edema (RD Assessment) Mild  [hands swollen]  Hair Reviewed  Eyes Reviewed  Mouth Reviewed  Skin Reviewed  Nails Reviewed     Diet Order:   Diet Order            Diet full liquid Room service appropriate? Yes; Fluid consistency: Thin  Diet effective now                EDUCATION NEEDS:  No education needs have been identified at this time  Skin:  Skin Assessment: Skin Integrity Issues: Skin Integrity Issues:: Incisions Incisions: abdomen  Last BM:  unsure  Height:  Ht Readings from Last 1 Encounters:  10/17/20 5\' 2"  (1.575 m)   Weight:  Wt Readings from Last 15 Encounters:  10/17/20 78.5 kg  10/03/20 80.3 kg  09/04/20 81 kg  08/28/20 81.3 kg  08/15/20 83.1 kg  07/27/20 83 kg  07/24/20 83.8 kg  07/10/20 88 kg  05/31/20 87.7 kg  05/28/20 89.7 kg  04/10/20 89 kg  02/16/20 87.1 kg  01/24/20 86.6 kg  11/01/19 89.8 kg  10/25/19 89.4 kg    Ideal Body Weight:  50 kg  BMI:  Body mass index is 31.64 kg/m.  Estimated Nutritional Needs:   Kcal:  1600-1800  Protein:  80-90  Fluid:  >1861mL/d   Ranell Patrick, RD, LDN Clinical Dietitian Pager on [place]

## 2020-10-19 ENCOUNTER — Inpatient Hospital Stay (HOSPITAL_COMMUNITY): Payer: Medicare Other

## 2020-10-19 DIAGNOSIS — E440 Moderate protein-calorie malnutrition: Secondary | ICD-10-CM | POA: Insufficient documentation

## 2020-10-19 LAB — URINALYSIS, COMPLETE (UACMP) WITH MICROSCOPIC
Bacteria, UA: NONE SEEN
Bilirubin Urine: NEGATIVE
Glucose, UA: NEGATIVE mg/dL
Hgb urine dipstick: NEGATIVE
Ketones, ur: 20 mg/dL — AB
Leukocytes,Ua: NEGATIVE
Nitrite: NEGATIVE
Protein, ur: NEGATIVE mg/dL
Specific Gravity, Urine: 1.014 (ref 1.005–1.030)
pH: 6 (ref 5.0–8.0)

## 2020-10-19 LAB — BASIC METABOLIC PANEL
Anion gap: 5 (ref 5–15)
BUN: 5 mg/dL — ABNORMAL LOW (ref 8–23)
CO2: 27 mmol/L (ref 22–32)
Calcium: 8.4 mg/dL — ABNORMAL LOW (ref 8.9–10.3)
Chloride: 105 mmol/L (ref 98–111)
Creatinine, Ser: 0.67 mg/dL (ref 0.44–1.00)
GFR, Estimated: >60 mL/min (ref >60)
Glucose, Bld: 152 mg/dL — ABNORMAL HIGH (ref 70–99)
Potassium: 3 mmol/L — ABNORMAL LOW (ref 3.5–5.1)
Sodium: 137 mmol/L (ref 135–145)

## 2020-10-19 LAB — GLUCOSE, CAPILLARY
Glucose-Capillary: 124 mg/dL — ABNORMAL HIGH (ref 70–99)
Glucose-Capillary: 144 mg/dL — ABNORMAL HIGH (ref 70–99)
Glucose-Capillary: 107 mg/dL — ABNORMAL HIGH (ref 70–99)
Glucose-Capillary: 134 mg/dL — ABNORMAL HIGH (ref 70–99)

## 2020-10-19 LAB — CBC
HCT: 34 % — ABNORMAL LOW (ref 36.0–46.0)
Hemoglobin: 11 g/dL — ABNORMAL LOW (ref 12.0–15.0)
MCH: 31.1 pg (ref 26.0–34.0)
MCHC: 32.4 g/dL (ref 30.0–36.0)
MCV: 96 fL (ref 80.0–100.0)
Platelets: 174 10*3/uL (ref 150–400)
RBC: 3.54 MIL/uL — ABNORMAL LOW (ref 3.87–5.11)
RDW: 13.2 % (ref 11.5–15.5)
WBC: 16.6 10*3/uL — ABNORMAL HIGH (ref 4.0–10.5)
nRBC: 0 % (ref 0.0–0.2)

## 2020-10-19 LAB — SURGICAL PATHOLOGY

## 2020-10-19 MED ORDER — POTASSIUM CHLORIDE IN NACL 20-0.45 MEQ/L-% IV SOLN
INTRAVENOUS | Status: DC
  Filled 2020-10-19 (×5): qty 1000

## 2020-10-19 MED ORDER — POTASSIUM CHLORIDE 10 MEQ/100ML IV SOLN
10.0000 meq | INTRAVENOUS | Status: AC
Start: 2020-10-19 — End: 2020-10-20
  Administered 2020-10-19 (×4): 10 meq via INTRAVENOUS
  Filled 2020-10-19 (×2): qty 100

## 2020-10-19 MED ORDER — POTASSIUM CHLORIDE 10 MEQ/100ML IV SOLN
10.0000 meq | INTRAVENOUS | Status: AC
  Filled 2020-10-19 (×2): qty 100

## 2020-10-19 MED ORDER — ACETAMINOPHEN 325 MG PO TABS
650.0000 mg | ORAL_TABLET | Freq: Four times a day (QID) | ORAL | Status: DC | PRN
  Administered 2020-10-19 (×2): 650 mg via ORAL
  Filled 2020-10-19 (×2): qty 2

## 2020-10-19 MED ORDER — HYDROCODONE-ACETAMINOPHEN 7.5-325 MG PO TABS
1.0000 | ORAL_TABLET | Freq: Four times a day (QID) | ORAL | Status: DC | PRN
  Administered 2020-10-19: 1 via ORAL
  Administered 2020-10-20: 2 via ORAL
  Filled 2020-10-19 (×2): qty 2
  Filled 2020-10-19: qty 1
  Filled 2020-10-19: qty 2

## 2020-10-19 NOTE — TOC Initial Note (Addendum)
Transition of Care [facility]) - Initial/Assessment Note    Patient Details  Name: Amanda Arnold MRN: [id number] Date of Birth: [birth year]  Transition [facility]) CM/SW Contact:    Marilu Favre, RN Phone Number: 10/19/2020, 10:50 AM  Clinical Narrative:                 Spoke to patient and daughter at bedside. Discussed PT recommendation for home health PT and walker. Patient already has walker.   Patient politely declined HHPT at this time. NCM explained if she changes her mind just to let the nurse know and NCM will come and see her . If She changes her mind after discharge, PCP can arrange. Both voiced understanding   Patient lives with her husband and states her children are always there.   Patient already has a walker at home.   Expected Discharge Plan: [place] Barriers to Discharge: Continued Medical Work up   Patient Goals and CMS Choice Patient states their goals for this hospitalization and ongoing recovery are:: to return to home CMS Medicare.gov Compare Post Acute Care list provided to:: Patient Choice offered to / list presented to : Patient  Expected Discharge Plan and Services Expected Discharge Plan: [place]   Discharge Planning Services: CM Consult Post Acute Care Choice: [place] arrangements for the past 2 months: Single Family Home                 DME Arranged: N/A DME Agency: NA       HH Arranged: PT,Patient Refused HH          Prior Living Arrangements/Services Living arrangements for the past 2 months: Single Family Home Lives with:: Spouse,Adult Children Patient language and need for interpreter reviewed:: Yes        Need for Family Participation in Patient Care: Yes (Comment) Care giver support system in place?: Yes (comment) Current home services: DME Criminal Activity/Legal Involvement Pertinent to Current Situation/Hospitalization: No - Comment as needed  Activities of Daily  Living Home Assistive Devices/Equipment: Walker (specify type),Dentures (specify type),Shower chair with back ADL Screening (condition at time of admission) Patient's cognitive ability adequate to safely complete daily activities?: Yes Is the patient deaf or have difficulty hearing?: No Does the patient have difficulty seeing, even when wearing glasses/contacts?: No Does the patient have difficulty concentrating, remembering, or making decisions?: No Patient able to express need for assistance with ADLs?: Yes Does the patient have difficulty dressing or bathing?: No Independently performs ADLs?: Yes (appropriate for developmental age) Does the patient have difficulty walking or climbing stairs?: No Weakness of Legs: None Weakness of Arms/Hands: None  Permission Sought/Granted                  Emotional Assessment Appearance:: Appears stated age Attitude/Demeanor/Rapport: Engaged Affect (typically observed): Accepting Orientation: : Oriented to Self,Oriented to Place,Oriented to  Time,Oriented to Situation Alcohol / Substance Use: Not Applicable Psych Involvement: No (comment)  Admission diagnosis:  Pancreatic mass [K86.89] Patient Active Problem List   Diagnosis Date Noted  . Malnutrition of moderate degree 10/19/2020  . Weight loss 09/04/2020  . Cancer of pancreas, tail ([place]) 08/28/2020  . Shoulder pain, right 07/10/2020  . Pancreatic mass 06/26/2020  . Coronary atherosclerosis 05/28/2020  . Pulmonary nodule 05/28/2020  . Palpitations 02/16/2020  . Dysuria 07/27/2019  . Piriformis syndrome 03/29/2019  . Humerus fracture 01/27/2019  . Closed fracture of left proximal humerus 12/07/2018  . Trochanteric  bursitis of right hip 07/12/2018  . Trochanteric bursitis of both hips 03/23/2018  . Neck pain 02/11/2018  . Peroneal mononeuropathy, left 01/26/2017  . Skin cyst 09/26/2016  . Adjustment disorder with mixed anxiety and depressed mood 06/27/2016  . Fatigue 03/28/2016   . Restless leg syndrome 03/28/2016  . Right lower quadrant abdominal pain 08/15/2015  . Well adult exam 07/03/2015  . Meralgia paraesthetica 05/29/2015  . Paresthesia 05/15/2015  . Sciatic leg pain 09/22/2014  . Rash and nonspecific skin eruption 09/11/2014  . Right lower quadrant pain 09/11/2014  . Nonspecific abnormal electrocardiogram (ECG) (EKG) 07/27/2014  . Precordial pain 07/27/2014  . Cerumen impaction 06/16/2014  . Chest pain, atypical 03/03/2014  . UTI (urinary tract infection) 03/29/2013  . Herpes zoster 03/02/2013  . Abscess of left leg 02/02/2013  . Leg pain, bilateral 12/09/2012  . Left groin pain 09/09/2012  . Unspecified sinusitis (chronic) 08/25/2012  . Chest wall pain 05/20/2012  . Nocturia 12/24/2011  . Psychosomatic pain 10/14/2011  . Left shoulder pain 08/22/2011  . Vertigo 06/13/2011  . Syncopal vertigo 06/13/2011  . Intertriginous candidiasis 03/07/2011  . Diabetes type 2, controlled ([place]) 03/07/2011  . HIP PAIN 06/17/2010  . HYPERGLYCEMIA 05/15/2010  . CENTRAL SLEEP APNEA CONDS CLASSIFIED ELSEWHERE 05/14/2010  . Obstructive sleep apnea 04/17/2010  . Obesity 04/10/2010  . 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 DYSPNEA 03/15/2010  . TRIGGER FINGER 12/18/2009  . ABDOMINAL PAIN, EPIGASTRIC 12/18/2009  . KNEE PAIN 07/11/2009  . 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 COLD SORE 05/11/2009  . T M J 05/11/2009  . BREAST PAIN 04/13/2009  . Edema 03/01/2009  . CERVICAL LYMPHADENOPATHY 03/01/2009  . PRURITUS 07/28/2008  . TACHYCARDIA 07/28/2008  . INSOMNIA, PERSISTENT 02/22/2008  . Cystitis 02/22/2008  . SWEATING 01/18/2008  . BRONCHITIS, ACUTE 12/08/2007  . Chronic fatigue 12/08/2007  . Headache(784.0) 12/08/2007  . COUGH 12/08/2007  . ARTHRALGIA 11/19/2007  . LOW BACK PAIN 01/27/2007  . Tobacco use disorder 11/28/2006  . Generalized anxiety disorder 05/25/2006  . Essential hypertension 05/25/2006  . COPD exacerbation ([place]) 05/25/2006  .  OSTEOARTHRITIS 05/25/2006  . Polyuria 05/25/2006  . Dyslipidemia 04/30/2006  . Myalgia 04/02/2006  . Anxiety and depression 01/02/2006  . Disturbance of skin sensation 05/29/2005  . ESOTROPIA, LEFT EYE 03-10-1946   PCP:  Cassandria Anger, MD Pharmacy:   [facility] [address] [place] [address] Phone: [phone] Fax: [place], [place] [address] Phone: [phone] Fax: [phone]     Social Determinants of Health (SDOH) Interventions    Readmission Risk Interventions No flowsheet data found.

## 2020-10-19 NOTE — Progress Notes (Signed)
Physical Therapy Treatment Patient Details Name: Amanda Arnold MRN: [id number] DOB: 01-13-1946 Today's Date: 10/19/2020    History of Present Illness 75 yo female with onset of abd pain was admitted for pancreatectomy and splenectomy on 4/6 after finding a pancreatic mass in 2021.  Pt is referred to PT for mobility due to recent surgery and wgt loss with increased weakness.  PMHx:  pancreatic mass, pulmonary nodule, R kidney stone, hepatic steatosis,    PT Comments    Pt standing leaving bathroom at start of session.  Pt with poor safety noted during gt and transfers.  She is declining HHPT and will d/c with no PT follow.  Updated recommendations based on patient preference.  Pt declined stair training after gt training.  Plan for stair training next session.     Follow Up Recommendations  No PT follow up;Supervision for mobility/OOB (declining HHPT even though she would benefit.)     Equipment Recommendations  None recommended by PT (has a rollator at home.)    Recommendations for Other Services       Precautions / Restrictions Precautions Precautions: Fall Precaution Comments: abd incision Restrictions Weight Bearing Restrictions: No    Mobility  Bed Mobility Overal bed mobility: Needs Assistance Bed Mobility: Sit to Supine           General bed mobility comments: Pt flat spinned on bottom with head of bed elevated.    Transfers Overall transfer level: Needs assistance Equipment used: Rolling walker (2 wheeled) Transfers: Sit to/from Stand Sit to Stand: Min guard         General transfer comment: Poor hand placement holding to device during stand to sit.  Ambulation/Gait Ambulation/Gait assistance: Min guard;Min assist Gait Distance (Feet): 60 Feet Assistive device: Rolling walker (2 wheeled) Gait Pattern/deviations: Step-through pattern;Decreased stride length;Wide base of support;Trunk flexed     General Gait Details: Cues for upper trunk control and  scap retraction.  Pt resting L elbow on RW hand grip and device sliding forward required min assistance to correct.   Stairs             Wheelchair Mobility    Modified Rankin (Stroke Patients Only)       Balance Overall balance assessment: Needs assistance   Sitting balance-Leahy Scale: Fair       Standing balance-Leahy Scale: Poor Standing balance comment: Poor safety with dynamic activities                            Cognition Arousal/Alertness: Awake/alert Behavior During Therapy: WFL for tasks assessed/performed Overall Cognitive Status: Within Functional Limits for tasks assessed                                        Exercises      General Comments        Pertinent Vitals/Pain Pain Assessment: 0-10 Pain Score: 7  Pain Location: abdomen during mobiliy Pain Descriptors / Indicators: Grimacing;Guarding Pain Intervention(s): Monitored during session;Repositioned;Ice applied    Home Living                      Prior Function            PT Goals (current goals can now be found in the care plan section) Acute Rehab PT Goals Patient Stated Goal: to go home with help Potential  to Achieve Goals: Good Progress towards PT goals: Progressing toward goals    Frequency    Min 3X/week      PT Plan Discharge plan needs to be updated    Co-evaluation              AM-PAC PT "6 Clicks" Mobility   Outcome Measure  Help needed turning from your back to your side while in a flat bed without using bedrails?: A Little Help needed moving from lying on your back to sitting on the side of a flat bed without using bedrails?: A Little Help needed moving to and from a bed to a chair (including a wheelchair)?: A Little Help needed standing up from a chair using your arms (e.g., wheelchair or bedside chair)?: A Little Help needed to walk in hospital room?: A Little Help needed climbing 3-5 steps with a railing? : A  Little 6 Click Score: 18    End of Session   Activity Tolerance: Patient tolerated treatment well;Patient limited by fatigue;Patient limited by pain Patient left: in bed;with call bell/phone within reach;with bed alarm set;with family/visitor present Nurse Communication: Mobility status PT Visit Diagnosis: Unsteadiness on feet (R26.81);Other abnormalities of gait and mobility (R26.89);Pain Pain - Right/Left:  (abdomen)     Time: 9924-2683 PT Time Calculation (min) (ACUTE ONLY): 17 min  Charges:  $Gait Training: 8-22 mins                     Erasmo Leventhal , PTA Acute Rehabilitation Services Pager [phone] Office [phone]     Ledia Hanford Eli Hose 10/19/2020, 12:00 PM

## 2020-10-19 NOTE — Progress Notes (Signed)
2 Days Post-Op   Subjective/Chief Complaint: Pt had fever overnight.  Has been incentive spirometer a bit   Objective: Vital signs in last 24 hours: Temp:  [98.3 F (36.8 C)-103.2 F (39.6 C)] 98.3 F (36.8 C) (04/08 0840) Pulse Rate:  [70-110] 84 (04/08 0840) Resp:  [16-22] 16 (04/08 0840) BP: (124-150)/(55-70) 138/64 (04/08 0840) SpO2:  [82 %-96 %] 92 % (04/08 0840) Last BM Date:  (PTA)  Intake/Output from previous day: 04/07 0701 - 04/08 0700 In: 3293.1 [P.O.:940; I.V.:2353.1] Out: 630 [Urine:600; Drains:30] Intake/Output this shift: Total I/O In: -  Out: 200 [Urine:200]  General appearance: alert, cooperative and no distress Resp: breathing comfortably GI: soft, non distended, approp tender, drain output serosang Extremities: extremities normal, atraumatic, no cyanosis or edema  Lab Results:  Recent Labs    10/18/20 0208 10/19/20 0152  WBC 11.6* 16.6*  HGB 11.1* 11.0*  HCT 33.8* 34.0*  PLT 171 174   BMET Recent Labs    10/18/20 0208 10/19/20 0152  NA 137 137  K 3.4* 3.0*  CL 105 105  CO2 26 27  GLUCOSE 182* 152*  BUN 6* 5*  CREATININE 0.67 0.67  CALCIUM 8.6* 8.4*   PT/INR No results for input(s): LABPROT, INR in the last 72 hours. ABG No results for input(s): PHART, HCO3 in the last 72 hours.  Invalid input(s): PCO2, PO2  Studies/Results: No results found.  Anti-infectives: Anti-infectives (From admission, onward)   Start     Dose/Rate Route Frequency Ordered Stop   10/17/20 1500  ceFAZolin (ANCEF) IVPB 2g/100 mL premix        2 g 200 mL/hr over 30 Minutes Intravenous  Once 10/17/20 1411 10/18/20 0713   10/17/20 0700  ceFAZolin (ANCEF) IVPB 2g/100 mL premix        2 g 200 mL/hr over 30 Minutes Intravenous On call to O.R. 10/17/20 0646 10/17/20 1300      Assessment/Plan: s/p Procedure(s): XI ROBOTIC ASSISTED DISTAL PANCREATECTOMY AND SPLENECTOMY (N/A)   Have continued home medications like 7.5 mg norco, celebrex, and gabapentin TID.   Increase norco to 2 tabs.  Robaxin available IV or PO. IV dilaudid for severe pain.  Await pathology - adenocarcinoma suspected based on appearance on scan, but prior biopsy was non diagnostic.   Pulmonary toilet.   Fever - p cxr, u/a and urine cx.   Moderate protein calorie malnutrition.   ABL anemia  VTE ppx - lovenox    LOS: 2 days    [name] 10/19/2020

## 2020-10-20 LAB — CBC
HCT: 33.8 % — ABNORMAL LOW (ref 36.0–46.0)
Hemoglobin: 10.8 g/dL — ABNORMAL LOW (ref 12.0–15.0)
MCH: 30.9 pg (ref 26.0–34.0)
MCHC: 32 g/dL (ref 30.0–36.0)
MCV: 96.6 fL (ref 80.0–100.0)
Platelets: 192 10*3/uL (ref 150–400)
RBC: 3.5 MIL/uL — ABNORMAL LOW (ref 3.87–5.11)
RDW: 13 % (ref 11.5–15.5)
WBC: 17.5 10*3/uL — ABNORMAL HIGH (ref 4.0–10.5)
nRBC: 0 % (ref 0.0–0.2)

## 2020-10-20 LAB — BASIC METABOLIC PANEL
Anion gap: 4 — ABNORMAL LOW (ref 5–15)
BUN: 6 mg/dL — ABNORMAL LOW (ref 8–23)
CO2: 25 mmol/L (ref 22–32)
Calcium: 8.3 mg/dL — ABNORMAL LOW (ref 8.9–10.3)
Chloride: 105 mmol/L (ref 98–111)
Creatinine, Ser: 0.62 mg/dL (ref 0.44–1.00)
GFR, Estimated: >60 mL/min (ref >60)
Glucose, Bld: 157 mg/dL — ABNORMAL HIGH (ref 70–99)
Potassium: 4.1 mmol/L (ref 3.5–5.1)
Sodium: 134 mmol/L — ABNORMAL LOW (ref 135–145)

## 2020-10-20 MED ORDER — HYDROCODONE-ACETAMINOPHEN 7.5-325 MG/15ML PO SOLN
15.0000 mL | Freq: Four times a day (QID) | ORAL | Status: DC | PRN
Start: 2020-10-20 — End: 2020-10-22
  Administered 2020-10-20 – 2020-10-21 (×3): 15 mL via ORAL
  Filled 2020-10-20 (×3): qty 15

## 2020-10-20 NOTE — Progress Notes (Signed)
Physical Therapy Treatment Patient Details Name: Amanda Arnold MRN: [id number] DOB: Jan 02, 1946 Today's Date: 10/20/2020    History of Present Illness 75 yo female with onset of abd pain was admitted for pancreatectomy and splenectomy on 4/6 after finding a pancreatic mass in 2021.  Pt is referred to PT for mobility due to recent surgery and wgt loss with increased weakness.  PMHx:  pancreatic mass, pulmonary nodule, R kidney stone, hepatic steatosis,    PT Comments    Pt received in supine, agreeable to therapy session with encouragement and with good participation and tolerance for mobility, but continues to report severe pain (8/10). Pt able to progress to community distance gait task using RW and performed 4 steps with modA and single railing. She would benefit from further gait/stair training although anticipate she will be able to perform household ambulation and get into home with +1-2 family members assisting at discharge as long as pain is controlled. Reinforced log roll sequencing for decreased pain with bed mobility and she will likely benefit from more reinforcement of this technique next session. Pt continues to benefit from PT services to progress toward functional mobility goals. Continue to recommend HHPT and pt would benefit from 3-in-1 upon DC as she reports she only has low toilet seat and no wall railing by toilet.  Follow Up Recommendations  No PT follow up;Supervision for mobility/OOB (pt declining HHPT although she would benefit)     Equipment Recommendations  3in1 (PT)    Recommendations for Other Services       Precautions / Restrictions Precautions Precautions: Fall Precaution Comments: abd incision, needs more log roll education reinforcement Restrictions Weight Bearing Restrictions: No    Mobility  Bed Mobility Overal bed mobility: Needs Assistance Bed Mobility: Rolling;Sidelying to Sit;Sit to Sidelying Rolling: Modified independent (Device/Increase  time) Sidelying to sit: Min assist     Sit to sidelying: Min assist General bed mobility comments: cues for sequencing log roll, needs step-by-step cues and given demo prior but poor carryover of instruction when returning to bed    Transfers Overall transfer level: Needs assistance Equipment used: Rolling walker (2 wheeled) Transfers: Sit to/from [facility] Sit to Stand: Min guard Stand pivot transfers: Min assist       General transfer comment: cues for hand placement needed with fair carryover; HHA initially to pivot to chair but able to walk with RW after being pushed to stairs  Ambulation/Gait Ambulation/Gait assistance: Min guard;Supervision (min guard initially progressing to Supervision) Gait Distance (Feet): 230 Feet (230, seated break, 71ft) Assistive device: Rolling walker (2 wheeled) Gait Pattern/deviations: Step-through pattern;Decreased stride length;Wide base of support Gait velocity: variable, 0.2-0.4 m/s   General Gait Details: fair use of RW, cues for activity pacing and pursed-lip breathing   Stairs Stairs: Yes Stairs assistance: Mod assist Stair Management: One rail Right;Step to pattern;Forwards Number of Stairs: 4 (2 steps x2 trials) General stair comments: HHA on LUE (modA) and rail on RUE per home setup (pt reports she holds onto door frame), modA for stability but no overt LOB, mod cues for sequencing steps   Wheelchair Mobility    Modified Rankin (Stroke Patients Only)       Balance Overall balance assessment: Needs assistance   Sitting balance-Leahy Scale: Good     Standing balance support: Bilateral upper extremity supported;During functional activity Standing balance-Leahy Scale: Fair Standing balance comment: able to static stand unsupported no LOB but needs min guard to minA for pivoting with U UE support and  supervision for gait with RW       Cognition Arousal/Alertness: Awake/alert Behavior During Therapy: WFL  for tasks assessed/performed;Anxious Overall Cognitive Status: Within Functional Limits for tasks assessed           General Comments: anxious but participatory; can do more than she thinks         General Comments General comments (skin integrity, edema, etc.): anxious re: pain but participatory; HR 90's bpm during mobility and SpO2 90-94% on RA with exertion, increases with rest. initially dizziness sitting upright in bed but quickly resolves and BP not further assessed; chair follow for safety with gait but pt did not need seated break with hallway ambulation or after stair training.      Pertinent Vitals/Pain Pain Assessment: 0-10 Pain Score: 8  Pain Location: abdomen during mobiliy Pain Descriptors / Indicators: Grimacing;Guarding Pain Intervention(s): Monitored during session;Repositioned;Patient requesting pain meds-RN notified;Ice applied           PT Goals (current goals can now be found in the care plan section) Acute Rehab PT Goals Patient Stated Goal: to go home with help PT Goal Formulation: With patient/family Time For Goal Achievement: 11/01/20 Potential to Achieve Goals: Good Progress towards PT goals: Progressing toward goals    Frequency    Min 3X/week      PT Plan Current plan remains appropriate;Equipment recommendations need to be updated       AM-PAC PT "6 Clicks" Mobility   Outcome Measure  Help needed turning from your back to your side while in a flat bed without using bedrails?: None Help needed moving from lying on your back to sitting on the side of a flat bed without using bedrails?: A Little Help needed moving to and from a bed to a chair (including a wheelchair)?: A Little Help needed standing up from a chair using your arms (e.g., wheelchair or bedside chair)?: A Little Help needed to walk in hospital room?: A Little Help needed climbing 3-5 steps with a railing? : A Lot 6 Click Score: 18    End of Session Equipment Utilized  During Treatment: Gait belt Activity Tolerance: Patient tolerated treatment well Patient left: in bed;with call bell/phone within reach;with bed alarm set;with family/visitor present;with nursing/sitter in room Nurse Communication: Mobility status;Patient requests pain meds PT Visit Diagnosis: Unsteadiness on feet (R26.81);Other abnormalities of gait and mobility (R26.89);Pain Pain - part of body:  (abdomen)     Time: 1003-1030 PT Time Calculation (min) (ACUTE ONLY): 27 min  Charges:  $Gait Training: 8-22 mins $Therapeutic Activity: 8-22 mins                     Lan Mcneill P., PTA Acute Rehabilitation Services Pager: [phone] Office: [place] 10/20/2020, 10:53 AM

## 2020-10-20 NOTE — Progress Notes (Signed)
Assessment & Plan: POD#3 s/p Procedure(s): XI ROBOTIC ASSISTED DISTAL PANCREATECTOMY AND SPLENECTOMY (N/A)    WBC rising, 17K this AM - repeat CBC in AM  Afebrile overnight, low grade yesterday  Pulmonary toilet, IS use  Encouraged OOB, ambulation with PT and family    Moderate protein calorie malnutrition.   ABL anemia  VTE ppx - lovenox        Armandina Gemma, MD       [facility]       Office: [phone]   Chief Complaint: Pancreatic neoplasm  Subjective: Patient in bed, some pain.  Family at bedside.  Taking limited clear liquids, some nausea.  Objective: Vital signs in last 24 hours: Temp:  [98.2 F (36.8 C)-99.7 F (37.6 C)] 98.4 F (36.9 C) (04/09 0900) Pulse Rate:  [78-97] 96 (04/09 0619) Resp:  [16-17] 16 (04/09 0619) BP: (128-150)/(55-76) 128/63 (04/09 0619) SpO2:  [90 %-94 %] 90 % (04/09 0619) Last BM Date: 10/16/20  Intake/Output from previous day: 04/08 0701 - 04/09 0700 In: 480 [P.O.:480] Out: 935 [Urine:900; Drains:35] Intake/Output this shift: Total I/O In: 118 [P.O.:118] Out: -   Physical Exam: HEENT - sclerae clear, mucous membranes moist Neck - soft Abdomen - soft without distension; drain serosanguinous; wounds dry and intact Ext - no edema, non-tender Neuro - alert & oriented, no focal deficits  Lab Results:  Recent Labs    10/19/20 0152 10/20/20 0028  WBC 16.6* 17.5*  HGB 11.0* 10.8*  HCT 34.0* 33.8*  PLT 174 192   BMET Recent Labs    10/19/20 0152 10/20/20 0028  NA 137 134*  K 3.0* 4.1  CL 105 105  CO2 27 25  GLUCOSE 152* 157*  BUN 5* 6*  CREATININE 0.67 0.62  CALCIUM 8.4* 8.3*   PT/INR No results for input(s): LABPROT, INR in the last 72 hours. Comprehensive Metabolic Panel:    Component Value Date/Time   NA 134 (L) 10/20/2020 0028   NA 137 10/19/2020 0152   NA 140 04/20/2020 0805   K 4.1 10/20/2020 0028   K 3.0 (L) 10/19/2020 0152   CL 105 10/20/2020 0028   CL 105 10/19/2020 0152    CO2 25 10/20/2020 0028   CO2 27 10/19/2020 0152   BUN 6 (L) 10/20/2020 0028   BUN 5 (L) 10/19/2020 0152   BUN 16 04/20/2020 0805   CREATININE 0.62 10/20/2020 0028   CREATININE 0.67 10/19/2020 0152   CREATININE 0.72 02/16/2020 0857   GLUCOSE 157 (H) 10/20/2020 0028   GLUCOSE 152 (H) 10/19/2020 0152   CALCIUM 8.3 (L) 10/20/2020 0028   CALCIUM 8.4 (L) 10/19/2020 0152   AST 19 08/22/2020 0846   AST 29 08/01/2020 1042   ALT 11 08/22/2020 0846   ALT 22 08/01/2020 1042   ALKPHOS 51 08/22/2020 0846   ALKPHOS 55 08/01/2020 1042   BILITOT 0.7 08/22/2020 0846   BILITOT 0.9 08/01/2020 1042   PROT 6.3 08/22/2020 0846   PROT 7.2 08/01/2020 1042   ALBUMIN 3.8 08/22/2020 0846   ALBUMIN 3.9 08/01/2020 1042    Studies/Results: DG CHEST PORT 1 VIEW  Result Date: 10/19/2020 CLINICAL DATA:  Fever EXAM: PORTABLE CHEST 1 VIEW COMPARISON:  Portable exam 1056 hours compared to 07/26/2020 FINDINGS: Enlargement of cardiac silhouette. Atherosclerotic calcification aorta. Mediastinal contours and pulmonary vascularity normal. Bibasilar atelectasis. No acute infiltrate, pleural effusion or pneumothorax. Bones demineralized with scattered endplate spur formation thoracic spine. IMPRESSION: Enlargement of cardiac silhouette with bibasilar atelectasis. Aortic Atherosclerosis (  ICD10-I70.0). Electronically Signed   By: Lavonia Dana M.D.   On: 10/19/2020 12:24      Armandina Gemma 10/20/2020  Patient ID: Amanda Arnold, female   DOB: 07-16-1945, 75 y.o.   MRN: [id number]

## 2020-10-20 NOTE — Progress Notes (Signed)
Pt has started hiccupping occasionally. Pt ambulated in hallway 200 feet.

## 2020-10-20 NOTE — Progress Notes (Signed)
[facility] to ask for liquid norco and to notify of pt hiccups.

## 2020-10-20 NOTE — Progress Notes (Signed)
NT reported CBG of 136. Machine did not allow it to cross over both times she took it.

## 2020-10-20 NOTE — Progress Notes (Signed)
Pt had a large BM at shift change

## 2020-10-21 LAB — BASIC METABOLIC PANEL
Anion gap: 9 (ref 5–15)
BUN: 8 mg/dL (ref 8–23)
CO2: 23 mmol/L (ref 22–32)
Calcium: 8.7 mg/dL — ABNORMAL LOW (ref 8.9–10.3)
Chloride: 103 mmol/L (ref 98–111)
Creatinine, Ser: 0.59 mg/dL (ref 0.44–1.00)
GFR, Estimated: >60 mL/min (ref >60)
Glucose, Bld: 135 mg/dL — ABNORMAL HIGH (ref 70–99)
Potassium: 4.1 mmol/L (ref 3.5–5.1)
Sodium: 135 mmol/L (ref 135–145)

## 2020-10-21 LAB — CBC
HCT: 35.3 % — ABNORMAL LOW (ref 36.0–46.0)
Hemoglobin: 11.4 g/dL — ABNORMAL LOW (ref 12.0–15.0)
MCH: 31.1 pg (ref 26.0–34.0)
MCHC: 32.3 g/dL (ref 30.0–36.0)
MCV: 96.4 fL (ref 80.0–100.0)
Platelets: 239 10*3/uL (ref 150–400)
RBC: 3.66 MIL/uL — ABNORMAL LOW (ref 3.87–5.11)
RDW: 12.9 % (ref 11.5–15.5)
WBC: 12.7 10*3/uL — ABNORMAL HIGH (ref 4.0–10.5)
nRBC: 0 % (ref 0.0–0.2)

## 2020-10-21 LAB — URINE CULTURE: Culture: 20000 — AB

## 2020-10-21 LAB — AMYLASE, PLEURAL OR PERITONEAL FLUID: Amylase, Fluid: 25 U/L

## 2020-10-21 NOTE — Progress Notes (Signed)
Assessment & Plan: POD#4 s/pProcedure(s): XI ROBOTIC ASSISTED DISTAL PANCREATECTOMY AND SPLENECTOMY (N/A)             WBC improving, 12.7 this AM             Afebrile overnight  Complains of poorly localized abd pain, wants to eat - will advance diet             Pulmonary toilet, IS use             Encouraged OOB, ambulation with PT and family  Moderate protein calorie malnutrition. ABL anemia  VTE ppx -lovenox        Armandina Gemma, MD       [facility]       Office: [phone]   Chief Complaint: Pancreatic neoplasm  Subjective: Patient in bed, family at bedside.  Tired of liquids, wants to eat.  Moderate abd pain, largely incisional.  Objective: Vital signs in last 24 hours: Temp:  [98.2 F (36.8 C)-99.2 F (37.3 C)] 98.2 F (36.8 C) (04/10 0442) Pulse Rate:  [65-81] 65 (04/10 0442) Resp:  [15-16] 16 (04/10 0442) BP: (116-153)/(61-72) 116/61 (04/10 0442) SpO2:  [93 %-95 %] 95 % (04/10 0442) Last BM Date: 10/20/20  Intake/Output from previous day: 04/09 0701 - 04/10 0700 In: 178 [P.O.:178] Out: 296 [Drains:95; Stool:201] Intake/Output this shift: Total I/O In: -  Out: 20 [Drains:20]  Physical Exam: HEENT - sclerae clear, mucous membranes moist Neck - soft Cor - RRR Abdomen - soft, mild diffuse tenderness; drain with small serous; wounds dry and intact Neuro - alert & oriented, no focal deficits  Lab Results:  Recent Labs    10/20/20 0028 10/21/20 0150  WBC 17.5* 12.7*  HGB 10.8* 11.4*  HCT 33.8* 35.3*  PLT 192 239   BMET Recent Labs    10/20/20 0028 10/21/20 0150  NA 134* 135  K 4.1 4.1  CL 105 103  CO2 25 23  GLUCOSE 157* 135*  BUN 6* 8  CREATININE 0.62 0.59  CALCIUM 8.3* 8.7*   PT/INR No results for input(s): LABPROT, INR in the last 72 hours. Comprehensive Metabolic Panel:    Component Value Date/Time   NA 135 10/21/2020 0150   NA 134 (L) 10/20/2020 0028   NA 140 04/20/2020 0805   K 4.1  10/21/2020 0150   K 4.1 10/20/2020 0028   CL 103 10/21/2020 0150   CL 105 10/20/2020 0028   CO2 23 10/21/2020 0150   CO2 25 10/20/2020 0028   BUN 8 10/21/2020 0150   BUN 6 (L) 10/20/2020 0028   BUN 16 04/20/2020 0805   CREATININE 0.59 10/21/2020 0150   CREATININE 0.62 10/20/2020 0028   CREATININE 0.72 02/16/2020 0857   GLUCOSE 135 (H) 10/21/2020 0150   GLUCOSE 157 (H) 10/20/2020 0028   CALCIUM 8.7 (L) 10/21/2020 0150   CALCIUM 8.3 (L) 10/20/2020 0028   AST 19 08/22/2020 0846   AST 29 08/01/2020 1042   ALT 11 08/22/2020 0846   ALT 22 08/01/2020 1042   ALKPHOS 51 08/22/2020 0846   ALKPHOS 55 08/01/2020 1042   BILITOT 0.7 08/22/2020 0846   BILITOT 0.9 08/01/2020 1042   PROT 6.3 08/22/2020 0846   PROT 7.2 08/01/2020 1042   ALBUMIN 3.8 08/22/2020 0846   ALBUMIN 3.9 08/01/2020 1042    Studies/Results: DG CHEST PORT 1 VIEW  Result Date: 10/19/2020 CLINICAL DATA:  Fever EXAM: PORTABLE CHEST 1 VIEW COMPARISON:  Portable exam 1056 hours compared  to 07/26/2020 FINDINGS: Enlargement of cardiac silhouette. Atherosclerotic calcification aorta. Mediastinal contours and pulmonary vascularity normal. Bibasilar atelectasis. No acute infiltrate, pleural effusion or pneumothorax. Bones demineralized with scattered endplate spur formation thoracic spine. IMPRESSION: Enlargement of cardiac silhouette with bibasilar atelectasis. Aortic Atherosclerosis (ICD10-I70.0). Electronically Signed   By: Lavonia Dana M.D.   On: 10/19/2020 12:24      Armandina Gemma 10/21/2020  Patient ID: Amanda Arnold, Amanda Arnold   DOB: 13-Dec-1945, 75 y.o.   MRN: [id number]

## 2020-10-22 LAB — CBC
HCT: 31.6 % — ABNORMAL LOW (ref 36.0–46.0)
Hemoglobin: 10.3 g/dL — ABNORMAL LOW (ref 12.0–15.0)
MCH: 31.2 pg (ref 26.0–34.0)
MCHC: 32.6 g/dL (ref 30.0–36.0)
MCV: 95.8 fL (ref 80.0–100.0)
Platelets: 334 10*3/uL (ref 150–400)
RBC: 3.3 MIL/uL — ABNORMAL LOW (ref 3.87–5.11)
RDW: 12.9 % (ref 11.5–15.5)
WBC: 9.7 10*3/uL (ref 4.0–10.5)
nRBC: 0 % (ref 0.0–0.2)

## 2020-10-22 LAB — GLUCOSE, CAPILLARY
Glucose-Capillary: 134 mg/dL — ABNORMAL HIGH (ref 70–99)
Glucose-Capillary: 123 mg/dL — ABNORMAL HIGH (ref 70–99)
Glucose-Capillary: 123 mg/dL — ABNORMAL HIGH (ref 70–99)
Glucose-Capillary: 116 mg/dL — ABNORMAL HIGH (ref 70–99)
Glucose-Capillary: 119 mg/dL — ABNORMAL HIGH (ref 70–99)
Glucose-Capillary: 132 mg/dL — ABNORMAL HIGH (ref 70–99)
Glucose-Capillary: 93 mg/dL (ref 70–99)
Glucose-Capillary: 135 mg/dL — ABNORMAL HIGH (ref 70–99)
Glucose-Capillary: 129 mg/dL — ABNORMAL HIGH (ref 70–99)
Glucose-Capillary: 122 mg/dL — ABNORMAL HIGH (ref 70–99)
Glucose-Capillary: 101 mg/dL — ABNORMAL HIGH (ref 70–99)
Glucose-Capillary: 118 mg/dL — ABNORMAL HIGH (ref 70–99)

## 2020-10-22 LAB — BASIC METABOLIC PANEL
Anion gap: 3 — ABNORMAL LOW (ref 5–15)
BUN: 6 mg/dL — ABNORMAL LOW (ref 8–23)
CO2: 29 mmol/L (ref 22–32)
Calcium: 8.5 mg/dL — ABNORMAL LOW (ref 8.9–10.3)
Chloride: 103 mmol/L (ref 98–111)
Creatinine, Ser: 0.68 mg/dL (ref 0.44–1.00)
GFR, Estimated: >60 mL/min (ref >60)
Glucose, Bld: 120 mg/dL — ABNORMAL HIGH (ref 70–99)
Potassium: 3.8 mmol/L (ref 3.5–5.1)
Sodium: 135 mmol/L (ref 135–145)

## 2020-10-22 MED ORDER — HYDROMORPHONE HCL 1 MG/ML IJ SOLN: 0.5000 mg | INTRAMUSCULAR | Status: DC | PRN

## 2020-10-22 MED ORDER — GABAPENTIN 300 MG PO CAPS
300.0000 mg | ORAL_CAPSULE | Freq: Three times a day (TID) | ORAL | Status: DC
  Administered 2020-10-22 – 2020-10-23 (×4): 300 mg via ORAL
  Filled 2020-10-22 (×4): qty 1

## 2020-10-22 MED ORDER — METHOCARBAMOL 750 MG PO TABS
750.0000 mg | ORAL_TABLET | Freq: Three times a day (TID) | ORAL | Status: DC
  Administered 2020-10-22 – 2020-10-23 (×5): 750 mg via ORAL
  Filled 2020-10-22 (×5): qty 1

## 2020-10-22 MED ORDER — OXYCODONE HCL 5 MG PO TABS
5.0000 mg | ORAL_TABLET | ORAL | Status: DC | PRN
  Administered 2020-10-22 – 2020-10-23 (×3): 10 mg via ORAL
  Filled 2020-10-22 (×3): qty 2

## 2020-10-22 NOTE — Progress Notes (Signed)
    5 Days Post-Op  Subjective: Afebrile, vitals stable. WBC normal today. Patient has been ambulating in hallways but reports her abdomen is very sore. Tolerated PO yesterday in small amounts. Drain amylase low (25). Had a bowel movement.   Objective: Vital signs in last 24 hours: Temp:  [97.9 F (36.6 C)-98.7 F (37.1 C)] 97.9 F (36.6 C) (04/11 0421) Pulse Rate:  [70-74] 70 (04/11 0421) Resp:  [16-17] 17 (04/11 0421) BP: (130-141)/(58-62) 130/60 (04/11 0421) SpO2:  [97 %-99 %] 99 % (04/11 0421) Weight:  [86.1 kg] 86.1 kg (04/11 0421) Last BM Date: 10/20/20  Intake/Output from previous day: 04/10 0701 - 04/11 0700 In: 2388.2 [P.O.:360; I.V.:2028.2] Out: 40 [Drains:40] Intake/Output this shift: No intake/output data recorded.  PE: General: resting comfortably, NAD Neuro: alert and oriented, no focal deficits Resp: normal work of breathing on room air Abdomen: soft, nondistended, appropriately tender. Incisions c/d/i with no erythema or induration. JP with minimal serous drainage. Extremities: warm and well-perfused  Lab Results:  Recent Labs    10/21/20 0150 10/22/20 0132  WBC 12.7* 9.7  HGB 11.4* 10.3*  HCT 35.3* 31.6*  PLT 239 334   BMET Recent Labs    10/21/20 0150 10/22/20 0132  NA 135 135  K 4.1 3.8  CL 103 103  CO2 23 29  GLUCOSE 135* 120*  BUN 8 6*  CREATININE 0.59 0.68  CALCIUM 8.7* 8.5*   PT/INR No results for input(s): LABPROT, INR in the last 72 hours. CMP     Component Value Date/Time   NA 135 10/22/2020 0132   NA 140 04/20/2020 0805   K 3.8 10/22/2020 0132   CL 103 10/22/2020 0132   CO2 29 10/22/2020 0132   GLUCOSE 120 (H) 10/22/2020 0132   BUN 6 (L) 10/22/2020 0132   BUN 16 04/20/2020 0805   CREATININE 0.68 10/22/2020 0132   CREATININE 0.72 02/16/2020 0857   CALCIUM 8.5 (L) 10/22/2020 0132   PROT 6.3 08/22/2020 0846   ALBUMIN 3.8 08/22/2020 0846   AST 19 08/22/2020 0846   ALT 11 08/22/2020 0846   ALKPHOS 51 08/22/2020 0846    BILITOT 0.7 08/22/2020 0846   GFRNONAA >60 10/22/2020 0132   GFRNONAA 84 02/16/2020 0857   GFRAA 103 04/20/2020 0805   GFRAA 98 02/16/2020 0857   Lipase     Component Value Date/Time   LIPASE 38 08/01/2020 1042       Assessment/Plan 75 yo female POD5 s/p robotic distal pancreatectomy. 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 - I reviewed the patient's path results with her this morning and discussed that she will need adjuvant chemotherapy when recovered from surgery. She has an appointment with Dr. Benay Spice on 4/28. - Continue regular diet, SLIV - Remove JP drain today - Bowel regimen - Pain control: change norco to oxycodone. Schedule multimodals (robaxin, gabapentin, tylenol) - VTE: lovenox - Dispo: inpatient, med-surg. Possible discharge tomorrow if pain control improved.    LOS: 5 days    Michaelle Birks, MD [facility] General, Hepatobiliary and Pancreatic Surgery 10/22/20 7:18 AM

## 2020-10-22 NOTE — Plan of Care (Signed)

## 2020-10-22 NOTE — Progress Notes (Signed)
JP drain removed per order. Pt tolerated well. Dry gauze  dressing applied. Will continue to monitor.

## 2020-10-22 NOTE — Progress Notes (Signed)
Physical Therapy Treatment Patient Details Name: Amanda Arnold MRN: [id number] DOB: [birth year] Today's Date: 10/22/2020    History of Present Illness 75 yo female with onset of abd pain was admitted for pancreatectomy and splenectomy on 4/6 after finding a pancreatic mass in 2021.  Pt is referred to PT for mobility due to recent surgery and wgt loss with increased weakness.  PMHx:  pancreatic mass, pulmonary nodule, R kidney stone, hepatic steatosis,    PT Comments    Pt was sitting in recliner with son present. She still requires assistance with proper use of walker during transfers. The patient and her son were given a handout on how to navigate stairs with the walker and no rails. Pt continues to decline HHPT.   Follow Up Recommendations  No PT follow up;Supervision for mobility/OOB (pt declining HHPT although she would benefit)     Equipment Recommendations  3in1 (PT)    Recommendations for Other Services       Precautions / Restrictions Precautions Precautions: Fall Precaution Comments: abd incision, needs more log roll education reinforcement    Mobility  Bed Mobility               General bed mobility comments: Provided education on long rolling to reduce pain when moving into/OOB.    Transfers Overall transfer level: Needs assistance Equipment used: Rolling walker (2 wheeled) Transfers: Sit to/from Stand Sit to Stand: Min guard         General transfer comment: cues needed for hand placement and technique when standing and sitting with walker.  Ambulation/Gait Ambulation/Gait assistance: Min guard;Supervision;+2 safety/equipment (for chair follow) Gait Distance (Feet): 150 Feet Assistive device: Rolling walker (2 wheeled) Gait Pattern/deviations: Step-through pattern;Decreased stride length     General Gait Details: fair use of RW. Cues for pacing and activity tolerance.   Stairs Stairs: Yes Stairs assistance: Min assist Stair Management: No  rails;Backwards;With walker;Step to pattern Number of Stairs: 2 General stair comments: cues for sequencing and for RW placement. Son present for education and handout given.   Wheelchair Mobility    Modified Rankin (Stroke Patients Only)       Balance Overall balance assessment: Needs assistance Sitting-balance support: No upper extremity supported Sitting balance-Leahy Scale: Good     Standing balance support: Bilateral upper extremity supported;During functional activity Standing balance-Leahy Scale: Fair                              Cognition Arousal/Alertness: Awake/alert Behavior During Therapy: WFL for tasks assessed/performed;Anxious Overall Cognitive Status: Within Functional Limits for tasks assessed                                 General Comments: Willing to work despite pain. Pt was eager to keep going to finish the task given by SPTA.      Exercises      General Comments        Pertinent Vitals/Pain Pain Assessment: 0-10 Pain Score: 9  Pain Location: Abdomen prior to and during mobility. Pt also stated she had back pain while ambulating. Pain Descriptors / Indicators: Grimacing;Moaning Pain Intervention(s): Monitored during session;Repositioned    Home Living                      Prior Function            PT Goals (current  goals can now be found in the care plan section) Acute Rehab PT Goals Patient Stated Goal: to go home with help PT Goal Formulation: With patient/family Potential to Achieve Goals: Good Progress towards PT goals: Progressing toward goals    Frequency    Min 3X/week      PT Plan Current plan remains appropriate;Equipment recommendations need to be updated    Co-evaluation              AM-PAC PT "6 Clicks" Mobility   Outcome Measure  Help needed turning from your back to your side while in a flat bed without using bedrails?: None Help needed moving from lying on your back  to sitting on the side of a flat bed without using bedrails?: A Little Help needed moving to and from a bed to a chair (including a wheelchair)?: A Little Help needed standing up from a chair using your arms (e.g., wheelchair or bedside chair)?: A Little Help needed to walk in hospital room?: A Little Help needed climbing 3-5 steps with a railing? : A Little 6 Click Score: 19    End of Session Equipment Utilized During Treatment: Gait belt Activity Tolerance: Patient tolerated treatment well;Patient limited by fatigue Patient left: in chair;with call bell/phone within reach (Nurse was notified pt left in chair without chair alarm, but son present. She deemed this okay.) Nurse Communication: Mobility status PT Visit Diagnosis: Unsteadiness on feet (R26.81);Other abnormalities of gait and mobility (R26.89);Pain Pain - Right/Left:  (Abdomen) Pain - part of body:  (Abdomen)     Time: 1001-1019 PT Time Calculation (min) (ACUTE ONLY): 18 min  Charges:  $Gait Training: 8-22 mins                     Sandria Manly, SPTA   Sandria Manly 10/22/2020, 2:16 PM

## 2020-10-22 NOTE — Plan of Care (Signed)
  Problem: Education: Goal: Knowledge of General Education information will improve Description: Including pain rating scale, medication(s)/side effects and non-pharmacologic comfort measures Outcome: Progressing   Problem: Health Behavior/Discharge Planning: Goal: Ability to manage health-related needs will improve Outcome: Progressing   Problem: Clinical Measurements: Goal: Ability to maintain clinical measurements within normal limits will improve Outcome: Progressing Goal: Respiratory complications will improve Outcome: Progressing   Problem: Activity: Goal: Risk for activity intolerance will decrease Outcome: Progressing   Problem: Elimination: Goal: Will not experience complications related to bowel motility Outcome: Progressing Goal: Will not experience complications related to urinary retention Outcome: Progressing   Problem: Pain Managment: Goal: General experience of comfort will improve Outcome: Progressing   Problem: Safety: Goal: Ability to remain free from injury will improve Outcome: Progressing   Problem: Skin Integrity: Goal: Risk for impaired skin integrity will decrease Outcome: Progressing

## 2020-10-23 LAB — GLUCOSE, CAPILLARY
Glucose-Capillary: 122 mg/dL — ABNORMAL HIGH (ref 70–99)
Glucose-Capillary: 138 mg/dL — ABNORMAL HIGH (ref 70–99)
Glucose-Capillary: 138 mg/dL — ABNORMAL HIGH (ref 70–99)
Glucose-Capillary: 137 mg/dL — ABNORMAL HIGH (ref 70–99)

## 2020-10-23 MED ORDER — ONDANSETRON 4 MG PO TBDP: 4.0000 mg | ORAL_TABLET | Freq: Four times a day (QID) | ORAL | 0 refills | Status: DC | PRN

## 2020-10-23 MED ORDER — METHOCARBAMOL 750 MG PO TABS: 750.0000 mg | ORAL_TABLET | Freq: Four times a day (QID) | ORAL | 0 refills | Status: AC | PRN

## 2020-10-23 MED ORDER — PROSOURCE PLUS PO LIQD
30.0000 mL | Freq: Two times a day (BID) | ORAL | Status: DC
  Administered 2020-10-23: 30 mL via ORAL
  Filled 2020-10-23: qty 30

## 2020-10-23 MED ORDER — OXYCODONE HCL 5 MG PO TABS: 5.0000 mg | ORAL_TABLET | ORAL | 0 refills | Status: DC | PRN

## 2020-10-23 MED ORDER — ACETAMINOPHEN 325 MG PO TABS: 650.0000 mg | ORAL_TABLET | Freq: Four times a day (QID) | ORAL | Status: DC | PRN

## 2020-10-23 MED ORDER — POLYETHYLENE GLYCOL 3350 17 G PO PACK: 17.0000 g | PACK | Freq: Every day | ORAL | 0 refills | Status: DC | PRN

## 2020-10-23 NOTE — Plan of Care (Signed)
  Problem: Education: Goal: Knowledge of General Education information will improve Description: Including pain rating scale, medication(s)/side effects and non-pharmacologic comfort measures Outcome: Adequate for Discharge   Problem: Clinical Measurements: Goal: Ability to maintain clinical measurements within normal limits will improve Outcome: Adequate for Discharge   Problem: Clinical Measurements: Goal: Will remain free from infection Outcome: Adequate for Discharge   Problem: Activity: Goal: Risk for activity intolerance will decrease Outcome: Adequate for Discharge

## 2020-10-23 NOTE — Discharge Instructions (Signed)
[facility] DISCHARGE INSTRUCTIONS  Activity . No heavy lifting greater than 10 pounds for 4 weeks after surgery. Madaline Brilliant to shower, but do not bathe or submerge incisions underwater. . Do not drive while taking narcotic pain medication.  Wound Care . Your incisions is covered with skin glue called Dermabond. This will peel off on its own over time. . You may shower and allow warm soapy water to run over your incisions. Gently pat dry. . Do not submerge your incision underwater. . Monitor your incision for any new redness, tenderness, or drainage. . Do not apply any cream, lotion, etc directly to your incisions.  When to Call us: Marland Kitchen Fever greater than 100.5 . New redness, drainage, or swelling at incision site . Severe pain, nausea, or vomiting . Jaundice (yellowing of the whites of the eyes or skin)  Follow-up You have an appointment scheduled with Dr. Barry Dienes on Nov 13, 2020 at 9:30am. This will be at the [facility] Surgery office at [address], [place], [place] at [place], [place]. Please arrive at least 15 minutes prior to your scheduled appointment time.  For questions or concerns, please call the office at [phone].

## 2020-10-23 NOTE — Progress Notes (Signed)
    6 Days Post-Op  Subjective: Afebrile, vitals stable. Pain improved today. Tolerating diet, no nausea or vomiting. Ambulating. Feels ready to go home today.   Objective: Vital signs in last 24 hours: Temp:  [98.3 F (36.8 C)-98.8 F (37.1 C)] 98.8 F (37.1 C) (04/12 0520) Pulse Rate:  [78-82] 78 (04/12 0520) Resp:  [16-19] 16 (04/12 0520) BP: (125-144)/(56-70) 133/65 (04/12 0520) SpO2:  [94 %-98 %] 94 % (04/12 0520) Last BM Date: 10/21/20  Intake/Output from previous day: 04/11 0701 - 04/12 0700 In: 470 [P.O.:470] Out: 20 [Drains:20] Intake/Output this shift: No intake/output data recorded.  PE: General: resting comfortably, NAD Neuro: alert and oriented, no focal deficits Resp: normal work of breathing on room air Abdomen: soft, nondistended, appropriately tender. Incisions c/d/i with no erythema or induration. Clean dressing in place over prior drain site. Extremities: warm and well-perfused  Lab Results:  Recent Labs    10/21/20 0150 10/22/20 0132  WBC 12.7* 9.7  HGB 11.4* 10.3*  HCT 35.3* 31.6*  PLT 239 334   BMET Recent Labs    10/21/20 0150 10/22/20 0132  NA 135 135  K 4.1 3.8  CL 103 103  CO2 23 29  GLUCOSE 135* 120*  BUN 8 6*  CREATININE 0.59 0.68  CALCIUM 8.7* 8.5*   PT/INR No results for input(s): LABPROT, INR in the last 72 hours. CMP     Component Value Date/Time   NA 135 10/22/2020 0132   NA 140 04/20/2020 0805   K 3.8 10/22/2020 0132   CL 103 10/22/2020 0132   CO2 29 10/22/2020 0132   GLUCOSE 120 (H) 10/22/2020 0132   BUN 6 (L) 10/22/2020 0132   BUN 16 04/20/2020 0805   CREATININE 0.68 10/22/2020 0132   CREATININE 0.72 02/16/2020 0857   CALCIUM 8.5 (L) 10/22/2020 0132   PROT 6.3 08/22/2020 0846   ALBUMIN 3.8 08/22/2020 0846   AST 19 08/22/2020 0846   ALT 11 08/22/2020 0846   ALKPHOS 51 08/22/2020 0846   BILITOT 0.7 08/22/2020 0846   GFRNONAA >60 10/22/2020 0132   GFRNONAA 84 02/16/2020 0857   GFRAA 103 04/20/2020 0805    GFRAA 98 02/16/2020 0857   Lipase     Component Value Date/Time   LIPASE 38 08/01/2020 1042       Assessment/Plan 75 yo female POD56 s/p robotic distal pancreatectomy. 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 - Continue regular diet - Bowel regimen - Multimodal pain control - VTE: lovenox - Dispo: discharge home today. Has follow up appointments scheduled with Dr. Barry Dienes and Dr. Benay Spice.    LOS: 6 days    Michaelle Birks, MD [facility] Surgery General, Hepatobiliary and Pancreatic Surgery 10/23/20 8:05 AM

## 2020-10-25 ENCOUNTER — Other Ambulatory Visit: Payer: Self-pay

## 2020-10-29 ENCOUNTER — Encounter: Payer: Self-pay | Admitting: Internal Medicine

## 2020-10-29 ENCOUNTER — Ambulatory Visit (INDEPENDENT_AMBULATORY_CARE_PROVIDER_SITE_OTHER): Payer: Medicare Other | Admitting: Internal Medicine

## 2020-10-29 ENCOUNTER — Other Ambulatory Visit: Payer: Self-pay | Admitting: Internal Medicine

## 2020-10-29 ENCOUNTER — Other Ambulatory Visit: Payer: Self-pay

## 2020-10-29 DIAGNOSIS — C252 Malignant neoplasm of tail of pancreas: Secondary | ICD-10-CM | POA: Diagnosis not present

## 2020-10-29 DIAGNOSIS — F411 Generalized anxiety disorder: Secondary | ICD-10-CM

## 2020-10-29 DIAGNOSIS — R1031 Right lower quadrant pain: Secondary | ICD-10-CM | POA: Diagnosis not present

## 2020-10-29 DIAGNOSIS — E119 Type 2 diabetes mellitus without complications: Secondary | ICD-10-CM

## 2020-10-29 DIAGNOSIS — Z9081 Acquired absence of spleen: Secondary | ICD-10-CM | POA: Insufficient documentation

## 2020-10-29 LAB — COMPREHENSIVE METABOLIC PANEL
ALT: 14 U/L (ref 0–35)
AST: 20 U/L (ref 0–37)
Albumin: 3.6 g/dL (ref 3.5–5.2)
Alkaline Phosphatase: 68 U/L (ref 39–117)
BUN: 12 mg/dL (ref 6–23)
CO2: 27 mEq/L (ref 19–32)
Calcium: 9.3 mg/dL (ref 8.4–10.5)
Chloride: 103 mEq/L (ref 96–112)
Creatinine, Ser: 0.66 mg/dL (ref 0.40–1.20)
GFR: 87.58 mL/min (ref >60.00)
Glucose, Bld: 253 mg/dL — ABNORMAL HIGH (ref 70–99)
Potassium: 4.2 mEq/L (ref 3.5–5.1)
Sodium: 138 mEq/L (ref 135–145)
Total Bilirubin: 0.4 mg/dL (ref 0.2–1.2)
Total Protein: 6.7 g/dL (ref 6.0–8.3)

## 2020-10-29 LAB — CBC WITH DIFFERENTIAL/PLATELET
Basophils Absolute: 0.1 10*3/uL (ref 0.0–0.1)
Basophils Relative: 0.8 % (ref 0.0–3.0)
Eosinophils Absolute: 0.3 10*3/uL (ref 0.0–0.7)
Eosinophils Relative: 2.6 % (ref 0.0–5.0)
HCT: 37.2 % (ref 36.0–46.0)
Hemoglobin: 12.2 g/dL (ref 12.0–15.0)
Lymphocytes Relative: 22.2 % (ref 12.0–46.0)
Lymphs Abs: 2.4 10*3/uL (ref 0.7–4.0)
MCHC: 32.8 g/dL (ref 30.0–36.0)
MCV: 92.2 fl (ref 78.0–100.0)
Monocytes Absolute: 0.9 10*3/uL (ref 0.1–1.0)
Monocytes Relative: 8.8 % (ref 3.0–12.0)
Neutro Abs: 7 10*3/uL (ref 1.4–7.7)
Neutrophils Relative %: 65.6 % (ref 43.0–77.0)
Platelets: 535 10*3/uL — ABNORMAL HIGH (ref 150.0–400.0)
RBC: 4.04 Mil/uL (ref 3.87–5.11)
RDW: 14 % (ref 11.5–15.5)
WBC: 10.7 10*3/uL — ABNORMAL HIGH (ref 4.0–10.5)

## 2020-10-29 LAB — URINALYSIS, ROUTINE W REFLEX MICROSCOPIC
Bilirubin Urine: NEGATIVE
Hgb urine dipstick: NEGATIVE
Nitrite: NEGATIVE
RBC / HPF: NONE SEEN (ref 0–?)
Specific Gravity, Urine: >=1.03 — AB (ref 1.000–1.030)
Total Protein, Urine: NEGATIVE
Urine Glucose: NEGATIVE
Urobilinogen, UA: 0.2 (ref 0.0–1.0)
pH: 5 (ref 5.0–8.0)

## 2020-10-29 MED ORDER — HYDROCODONE-ACETAMINOPHEN 7.5-325 MG PO TABS: 1.0000 | ORAL_TABLET | Freq: Three times a day (TID) | ORAL | 0 refills | Status: DC | PRN

## 2020-10-29 MED ORDER — ALPRAZOLAM 0.5 MG PO TABS
ORAL_TABLET | ORAL | 1 refills | Status: DC
Start: 2020-10-29 — End: 2021-01-24

## 2020-10-29 MED ORDER — REPAGLINIDE 1 MG PO TABS: 1.0000 mg | ORAL_TABLET | Freq: Three times a day (TID) | ORAL | 5 refills | Status: DC

## 2020-10-29 NOTE — Progress Notes (Signed)
Subjective:  Patient ID: Amanda Arnold, female    DOB: March 13, 1946  Age: 75 y.o. MRN: [id number]  CC: Tailbone Pain   HPI Amanda Arnold presents for pos-hosp f/u.  S/p robotic distal pancreatectomy, splenectomy on 10/17/20. 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  F/u LBP, HTN, DM f/u  Scars are healing   Outpatient Medications Prior to Visit  Medication Sig Dispense Refill  . ALPRAZolam (XANAX) 0.5 MG tablet TAKE 1 TABLET(0.5 MG) BY MOUTH TWICE DAILY AS NEEDED FOR ANXIETY (Patient taking differently: Take 0.5 mg by mouth 2 (two) times daily as needed for anxiety.) 60 tablet 1  . gabapentin (NEURONTIN) 300 MG capsule Take 1 capsule (300 mg total) by mouth 3 (three) times daily. (Patient taking differently: Take 300 mg by mouth 3 (three) times daily as needed (pain).) 90 capsule 5  . metoprolol succinate (TOPROL-XL) 25 MG 24 hr tablet TAKE 1/2 TABLET(12.5 MG) BY MOUTH IN AM AND 1/2 PM (Patient taking differently: Take 12.5 mg by mouth in the morning and at bedtime.) 90 tablet 1  . omeprazole (PRILOSEC) 40 MG capsule Take 1 capsule (40 mg total) by mouth 2 (two) times daily before a meal. 60 capsule 6  . ondansetron (ZOFRAN-ODT) 4 MG disintegrating tablet Take 1 tablet (4 mg total) by mouth every 6 (six) hours as needed for nausea. 20 tablet 0  . polyethylene glycol (MIRALAX / GLYCOLAX) 17 g packet Take 17 g by mouth daily as needed for mild constipation. 14 each 0  . rosuvastatin (CRESTOR) 10 MG tablet Take 1 tablet (10 mg total) by mouth daily. (Patient taking differently: Take 10 mg by mouth in the morning.) 90 tablet 3  . acetaminophen (TYLENOL) 325 MG tablet Take 2 tablets (650 mg total) by mouth every 6 (six) hours as needed for mild pain. (Patient not taking: Reported on 10/29/2020)    . celecoxib (CELEBREX) 200 MG capsule 1 po qd-bid pc prn pain (Patient not taking: Reported on 10/29/2020) 60 capsule 1  . oxyCODONE (OXY IR/ROXICODONE) 5 MG  immediate release tablet Take 1-2 tablets (5-10 mg total) by mouth every 4 (four) hours as needed (5mg  for moderate pain, 10mg  for severe pain). (Patient not taking: Reported on 10/29/2020) 25 tablet 0   No facility-administered medications prior to visit.    ROS: Review of Systems  Constitutional: Positive for fatigue and unexpected weight change. Negative for activity change, appetite change and chills.  HENT: Negative for congestion, mouth sores and sinus pressure.   Eyes: Negative for visual disturbance.  Respiratory: Negative for cough and chest tightness.   Gastrointestinal: Positive for abdominal distention and constipation. Negative for abdominal pain, nausea and vomiting.  Genitourinary: Negative for difficulty urinating, frequency and vaginal pain.  Musculoskeletal: Positive for arthralgias and back pain. Negative for gait problem.  Skin: Negative for pallor and rash.  Neurological: Negative for dizziness, tremors, weakness, numbness and headaches.  Psychiatric/Behavioral: Negative for confusion, sleep disturbance and suicidal ideas. The patient is nervous/anxious.     Objective:  BP 134/82 (BP Location: Left Arm)   Pulse 79   Temp 98.6 F (37 C) (Oral)   Ht 5\' 2"  (1.575 m)   Wt 171 lb 9.6 oz (77.8 kg)   LMP  (LMP Unknown)   SpO2 96%   BMI 31.39 kg/m   BP Readings from Last 3 Encounters:  10/29/20 134/82  10/23/20 133/65  10/03/20 138/80    Wt Readings from Last 3 Encounters:  10/29/20 171 lb  9.6 oz (77.8 kg)  10/22/20 189 lb 13.1 oz (86.1 kg)  10/03/20 177 lb (80.3 kg)    Physical Exam Constitutional:      General: She is not in acute distress.    Appearance: She is well-developed. She is obese.  HENT:     Head: Normocephalic.     Right Ear: External ear normal.     Left Ear: External ear normal.     Nose: Nose normal.  Eyes:     General:        Right eye: No discharge.        Left eye: No discharge.     Conjunctiva/sclera: Conjunctivae normal.      Pupils: Pupils are equal, round, and reactive to light.  Neck:     Thyroid: No thyromegaly.     Vascular: No JVD.     Trachea: No tracheal deviation.  Cardiovascular:     Rate and Rhythm: Normal rate and regular rhythm.     Heart sounds: Normal heart sounds.  Pulmonary:     Effort: No respiratory distress.     Breath sounds: No stridor. No wheezing.  Abdominal:     General: Bowel sounds are normal. There is no distension.     Palpations: Abdomen is soft. There is no mass.     Tenderness: There is no abdominal tenderness. There is no guarding or rebound.  Musculoskeletal:        General: Tenderness present.     Cervical back: Normal range of motion and neck supple.  Lymphadenopathy:     Cervical: No cervical adenopathy.  Skin:    Findings: No erythema or rash.  Neurological:     Mental Status: She is oriented to person, place, and time.     Cranial Nerves: No cranial nerve deficit.     Motor: No abnormal muscle tone.     Coordination: Coordination normal.     Gait: Gait abnormal.     Deep Tendon Reflexes: Reflexes normal.  Psychiatric:        Behavior: Behavior normal.        Thought Content: Thought content normal.        Judgment: Judgment normal.   scars are healing  Lab Results  Component Value Date   WBC 9.7 10/22/2020   HGB 10.3 (L) 10/22/2020   HCT 31.6 (L) 10/22/2020   PLT 334 10/22/2020   GLUCOSE 120 (H) 10/22/2020   CHOL 167 09/17/2017   TRIG 92.0 09/17/2017   HDL 53.00 09/17/2017   LDLCALC 96 09/17/2017   ALT 11 08/22/2020   AST 19 08/22/2020   NA 135 10/22/2020   K 3.8 10/22/2020   CL 103 10/22/2020   CREATININE 0.68 10/22/2020   BUN 6 (L) 10/22/2020   CO2 29 10/22/2020   TSH 1.24 02/16/2020   INR 1.1 (H) 08/22/2020   HGBA1C 5.9 (H) 10/17/2020    No results found.  Assessment & Plan:   Walker Kehr, MD

## 2020-10-29 NOTE — Assessment & Plan Note (Signed)
Worse post partial pancreatectomy-start Prandin

## 2020-10-29 NOTE — Addendum Note (Signed)
Addended by: Boris Lown B on: 10/29/2020 10:45 AM   Modules accepted: Orders

## 2020-10-29 NOTE — Assessment & Plan Note (Addendum)
S/p robotic distal pancreatectomy - Dr Barry Dienes. 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 Discussed Norco prn  Potential benefits of a long term opioids use as well as potential risks (i.e. addiction risk, apnea etc) and complications (i.e. Somnolence, constipation and others) were explained to the patient and were aknowledged.

## 2020-10-29 NOTE — Assessment & Plan Note (Signed)
S/p robotic distal pancreatectomy - Dr Barry Dienes. 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 Discussed Vaccinated

## 2020-10-29 NOTE — Assessment & Plan Note (Signed)
On PRN Xanax  Potential benefits of a long term benzodiazepines  use as well as potential risks  and complications were explained to the patient and were aknowledged.

## 2020-11-08 ENCOUNTER — Other Ambulatory Visit: Payer: Self-pay

## 2020-11-08 ENCOUNTER — Other Ambulatory Visit (HOSPITAL_COMMUNITY): Payer: Self-pay

## 2020-11-08 ENCOUNTER — Telehealth: Payer: Self-pay | Admitting: Pharmacist

## 2020-11-08 ENCOUNTER — Telehealth: Payer: Self-pay | Admitting: Genetic Counselor

## 2020-11-08 ENCOUNTER — Telehealth: Payer: Self-pay

## 2020-11-08 ENCOUNTER — Inpatient Hospital Stay: Payer: Medicare Other | Attending: Oncology | Admitting: Oncology

## 2020-11-08 VITALS — BP 131/62 | HR 86 | Temp 98.2°F | Resp 18 | Ht 62.0 in | Wt 169.0 lb

## 2020-11-08 DIAGNOSIS — Z8616 Personal history of COVID-19: Secondary | ICD-10-CM | POA: Insufficient documentation

## 2020-11-08 DIAGNOSIS — J449 Chronic obstructive pulmonary disease, unspecified: Secondary | ICD-10-CM | POA: Insufficient documentation

## 2020-11-08 DIAGNOSIS — C252 Malignant neoplasm of tail of pancreas: Secondary | ICD-10-CM

## 2020-11-08 DIAGNOSIS — K259 Gastric ulcer, unspecified as acute or chronic, without hemorrhage or perforation: Secondary | ICD-10-CM | POA: Diagnosis not present

## 2020-11-08 DIAGNOSIS — L989 Disorder of the skin and subcutaneous tissue, unspecified: Secondary | ICD-10-CM | POA: Insufficient documentation

## 2020-11-08 DIAGNOSIS — I10 Essential (primary) hypertension: Secondary | ICD-10-CM | POA: Insufficient documentation

## 2020-11-08 DIAGNOSIS — Z87891 Personal history of nicotine dependence: Secondary | ICD-10-CM | POA: Diagnosis not present

## 2020-11-08 DIAGNOSIS — Z79899 Other long term (current) drug therapy: Secondary | ICD-10-CM | POA: Insufficient documentation

## 2020-11-08 DIAGNOSIS — R911 Solitary pulmonary nodule: Secondary | ICD-10-CM | POA: Diagnosis not present

## 2020-11-08 MED ORDER — CAPECITABINE 500 MG PO TABS
ORAL_TABLET | ORAL | 0 refills | Status: DC
  Filled 2020-11-08: qty 126, fill #0

## 2020-11-08 MED ORDER — CAPECITABINE 500 MG PO TABS
ORAL_TABLET | ORAL | 0 refills | Status: DC
  Filled 2020-11-08: qty 126, fill #0
  Filled 2020-11-15: qty 126, 28d supply, fill #0

## 2020-11-08 NOTE — Telephone Encounter (Signed)
Oral Oncology Patient Advocate Encounter  After completing a benefits investigation, prior authorization for Xeloda is not required at this time through [facility].  Patient's copay is $45.83.     [facility] Phone [phone] Fax [phone] 11/08/2020 12:38 PM

## 2020-11-08 NOTE — Progress Notes (Signed)
Amanda Arnold OFFICE PROGRESS NOTE   Diagnosis: Pancreas cancer  INTERVAL HISTORY:   [name] was taken to the operating room by Dr. Barry Dienes on 10/17/2020.  She underwent a distal pancreatectomy and splenectomy.  There was no evidence of metastatic disease.  There was a mass at the tail of the pancreas. The pathology revealed a 3.9 cm well differentiated adenocarcinoma arising in association with an intraductal papillary mucinous neoplasm.  Tumor extended to peripancreatic soft tissue.  Lymphovascular and perineural invasion were present.  The resection margins are negative.  2 of 15 lymph nodes contained metastatic carcinoma.  She was discharged home 10/23/2020.  She is eating and performing self-care.  She generally feels well at present.  She saw Dr. Alain Marion last week.  She reports developing dyspnea after she was placed on a diabetic medication.  She discontinued the medication. Objective:  Vital signs in last 24 hours:  Blood pressure 131/62, pulse 86, temperature 98.2 F (36.8 C), temperature source Oral, resp. rate 18, height 5\' 2"  (1.575 m), weight 169 lb (76.7 kg), SpO2 97 %.    Lymphatics: No cervical, supraclavicular, axillary, or inguinal nodes Resp: Lungs clear bilaterally Cardio: Regular rate and rhythm GI: No hepatosplenomegaly, healed trocar and midline incisions, no mass Vascular: No leg edema   Portacath/PICC-without erythema  Lab Results:  Lab Results  Component Value Date   WBC 10.7 (H) 10/29/2020   HGB 12.2 10/29/2020   HCT 37.2 10/29/2020   MCV 92.2 10/29/2020   PLT 535.0 Repeated and verified X2. (H) 10/29/2020   NEUTROABS 7.0 10/29/2020    CMP  Lab Results  Component Value Date   NA 138 10/29/2020   K 4.2 10/29/2020   CL 103 10/29/2020   CO2 27 10/29/2020   GLUCOSE 253 (H) 10/29/2020   BUN 12 10/29/2020   CREATININE 0.66 10/29/2020   CALCIUM 9.3 10/29/2020   PROT 6.7 10/29/2020   ALBUMIN 3.6 10/29/2020   AST 20 10/29/2020    ALT 14 10/29/2020   ALKPHOS 68 10/29/2020   BILITOT 0.4 10/29/2020   GFRNONAA >60 10/22/2020   GFRAA 103 04/20/2020    Medications: I have reviewed the patient's current medications.   Assessment/Plan: 1. Pancreas cancer-stage IIb (pT2pN1)  CT abdomen/pelvis 06/01/2020-2.9 x 3 cm pancreas tail mass, slight increase in size of 7 mm right middle lobe nodule  MRI/MRCP 06/22/2020-solid 3.8 x 3 x 3.1 cm pancreas tail mass with focal occlusion of the splenic vein and dilated venous collaterals, no evidence of metastatic disease, bilateral adrenal adenomas  CT renal stone study 08/02/2020-scattered airspace opacities throughout the lung bases-new favored to represent infectious/inflammatory changes  EUS 08/16/2020-pancreas tail mass, T2N0, FNA biopsy-suspicious for malignancy  CTs 08/27/2020-unchanged right middle lobe nodule, superficial left upper back lesion, no change in pancreas tail mass, occluded splenic vein with collaterals at the splenic hilum, no enlarged abdominal pelvic lymph nodes  Distal pancreatectomy/splenectomy 10/17/2020- 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  2/15 lymph nodes positive, positive lymphovascular and perineural invasion, resection margins negative, pT2pN1 2.   Gastric ulcer on EUS 08/16/2020-H. pylori positive 3.   COVID-19 infection January 2022 4.   COPD 5.   Hypertension 6.   Right abdomen/right leg pain-musculoskeletal? 7.   Right middle lobe nodule on CT 06/01/2020, increased from 2017 8.   History of tobacco use 9.   Superficial left upper back lesion noted on CT 08/27/2020    Disposition: [name] has  been diagnosed with pancreas cancer.  She underwent a distal pancreatectomy/splenectomy 10/17/2020 for a stage IIb tumor.  I discussed the prognosis and adjuvant treatment options with [name] and her daughter.  There is a significant chance of developing  recurrent pancreas cancer over the next several years.  I recommend adjuvant systemic chemotherapy.  We discussed FOLFIRINOX and gemcitabine/Xeloda.  [name] has multiple comorbid conditions.  She does not appear to be a candidate for FOLFIRINOX chemotherapy and does not wish to receive this regimen.  I recommend adjuvant gemcitabine/Xeloda.  We reviewed potential toxicities associated with this regimen including the chance of nausea, alopecia, diarrhea, and hematologic toxicity.  We discussed the fever, rash, and pneumonitis associated with gemcitabine.  We discussed the rash, hyperpigmentation, sun sensitivity, hand/foot syndrome, and cardiac toxicity seen with capecitabine.  She agrees to proceed.  She will attend a chemotherapy teaching class.  [name] will be referred to the genetics counselor.  She will be referred to Dr. Barry Dienes for Port-A-Cath placement.  The plan is to begin gemcitabine/Xeloda on 11/21/2020.  A chemotherapy plan was entered today.  Betsy Coder, MD  11/08/2020  10:47 AM

## 2020-11-08 NOTE — Progress Notes (Signed)
START ON PATHWAY REGIMEN - Pancreatic Adenocarcinoma     A cycle is every 28 days:     Capecitabine      Gemcitabine   **Always confirm dose/schedule in your pharmacy ordering system**  Patient Characteristics: Postoperative without Neoadjuvant Therapy (Pathologic Staging), Negative Margins (Includes Positive Lymph Nodes) Therapeutic Status: Postoperative without Neoadjuvant Therapy (Pathologic Staging) AJCC T Category: pT2 AJCC N Category: pN1 AJCC M Category: cM0 AJCC 8 Stage Grouping: IIB Intent of Therapy: Curative Intent, Discussed with Patient

## 2020-11-08 NOTE — Telephone Encounter (Signed)
Oral Oncology Pharmacist Encounter  Received new prescription for Xeloda (capecitabine) for the adjuvant treatment of stage IIb pancreatic cancer in conjunction with gemcitabine, planned duration ~6 cycles.  Prescription dose and frequency assessed for appropriateness. Appropriate for therapy initiation. Tentative start date of 11/21/20.    CBC w/ Diff and CMP from 10/29/20 assessed, labs OK for treatment initiation.  Current medication list in Epic reviewed, DDIs with Xeloda identified: Category C DDI between Xeloda and omeprazole - proton-pump inhibitors may decrease efficacy of Xeloda. Recommend considering risk vs benefit of continuing PPI and if patient is candidate for alternative acid suppression such as H2RA's like famotidine while on Xeloda   Evaluated chart and no patient barriers to medication adherence noted.   Patient agreement for treatment documented in MD note on 11/08/20.  Prescription has been e-scribed to the [facility] for benefits analysis and approval.  Oral Oncology Clinic will continue to follow for insurance authorization, copayment issues, initial counseling and start date.  Leron Croak, PharmD, BCPS Hematology/Oncology Clinical Pharmacist [facility] [phone] 11/08/2020 12:00 PM

## 2020-11-08 NOTE — Discharge Summary (Signed)
Physician Discharge Summary  Patient ID: Amanda Arnold MRN: [id number] DOB/AGE: [birth year] 73 y.o.  Admit date: 10/17/2020 Discharge date: 11/08/2020  Admission Diagnoses: Pancreatic tail mass ABL anemia OSA Hyperlipidemia Anxiety Uncontrolled DM type 2 Obesity   Discharge Diagnoses:  Adenocarcinoma of the pancreatic tail, pT2N1 (so metastatic to lymph node) Moderate protein calorie malnutrition  Discharged Condition: stable  Hospital Course: Pt underwent robotic distal pancreatectomy/splenectomy 10/17/2020. On POD 1 she had quite a lot of pain and had to be converted to dilaudid for pain control and well as more aggressive non narcotic therapy with TID gabapentin, celebrex, norco, and robaxin IV.  She had leukocytosis and a low grade fever.  Fever workup was negative.  White count came back to normal.  Drain amylase was normal (25).  Drain was pulled prior to discharge.  Path came back as T2N1.  She was discharged to home on POD 6 in stable condition  Consults: None  Significant Diagnostic Studies: labs: Glucose 253, WBCs 10.7, HCT 37.2  Treatments: surgery: see above  Discharge Exam: Blood pressure 133/65, pulse 78, temperature 98.8 F (37.1 C), temperature source Oral, resp. rate 16, height 5\' 2"  (1.575 m), weight 86.1 kg, SpO2 94 %. General appearance: alert, cooperative and no distress Resp: breathing comfortably GI: soft, non distended, approp tender Extremities: extremities normal, atraumatic, no cyanosis or edema  Disposition: Discharge disposition: 01-Home or Self Care       Discharge Instructions    Call MD for:  persistant nausea and vomiting   Complete by: As directed    Call MD for:  redness, tenderness, or signs of infection (pain, swelling, redness, odor or green/yellow discharge around incision site)   Complete by: As directed    Call MD for:  severe uncontrolled pain   Complete by: As directed    Call MD for:  temperature >100.4   Complete by: As  directed    Diet general   Complete by: As directed    Increase activity slowly   Complete by: As directed    Lifting restrictions   Complete by: As directed    10 pounds     Allergies as of 10/23/2020      Reactions   Morphine And Related Shortness Of Breath   Bupropion Hcl Other (See Comments)   "does not feel right"   Ezetimibe Other (See Comments)   REACTION: legs burning   Metformin And Related Diarrhea   Diarrhea w/XR or regular    Naproxen    Upset stomach, diarrhea   Trazodone Hcl Swelling   Tramadol Hcl Palpitations   REACTION: palpitations      Medication List    STOP taking these medications   HYDROcodone-acetaminophen 7.5-325 MG tablet Commonly known as: NORCO     TAKE these medications   gabapentin 300 MG capsule Commonly known as: NEURONTIN Take 1 capsule (300 mg total) by mouth 3 (three) times daily. What changed:   when to take this  reasons to take this   metoprolol succinate 25 MG 24 hr tablet Commonly known as: TOPROL-XL TAKE 1/2 TABLET(12.5 MG) BY MOUTH IN AM AND 1/2 PM What changed:   how much to take  how to take this  when to take this  additional instructions   omeprazole 40 MG capsule Commonly known as: PRILOSEC Take 1 capsule (40 mg total) by mouth 2 (two) times daily before a meal.   rosuvastatin 10 MG tablet Commonly known as: Crestor Take 1 tablet (10 mg total) by  mouth daily. What changed: when to take this     ASK your doctor about these medications   methocarbamol 750 MG tablet Commonly known as: ROBAXIN Take 1 tablet (750 mg total) by mouth 4 (four) times daily as needed for up to 5 days for muscle spasms. Ask about: Should I take this medication?        SignedStark Klein 11/08/2020, 2:44 PM

## 2020-11-08 NOTE — Telephone Encounter (Signed)
Received a genetic counseling referral from Dr. Benay Spice for pancreatic counseling. Amanda Arnold has been cld and scheduled to see Cari on 5/12 at 11am. Letter mailed.

## 2020-11-09 ENCOUNTER — Telehealth: Payer: Self-pay | Admitting: Oncology

## 2020-11-09 ENCOUNTER — Other Ambulatory Visit: Payer: Self-pay | Admitting: General Surgery

## 2020-11-09 NOTE — Telephone Encounter (Signed)
Scheduled patient is aware of appt added on 5/11 at 8 am - per 5/28 sch msg

## 2020-11-12 ENCOUNTER — Other Ambulatory Visit (HOSPITAL_COMMUNITY)
Admission: RE | Admit: 2020-11-12 | Discharge: 2020-11-12 | Disposition: A | Discharge status: Home / Self Care | Payer: Medicare Other | Source: Ambulatory Visit | Attending: General Surgery | Admitting: General Surgery

## 2020-11-12 DIAGNOSIS — Z01812 Encounter for preprocedural laboratory examination: Secondary | ICD-10-CM | POA: Diagnosis not present

## 2020-11-12 DIAGNOSIS — Z20822 Contact with and (suspected) exposure to covid-19: Secondary | ICD-10-CM | POA: Diagnosis not present

## 2020-11-13 ENCOUNTER — Ambulatory Visit (HOSPITAL_COMMUNITY)
Admission: RE | Admit: 2020-11-13 | Discharge: 2020-11-13 | Disposition: A | Discharge status: Home / Self Care | Payer: Medicare Other | Source: Ambulatory Visit | Attending: General Surgery | Admitting: General Surgery

## 2020-11-13 ENCOUNTER — Other Ambulatory Visit: Payer: Self-pay

## 2020-11-13 ENCOUNTER — Encounter (HOSPITAL_COMMUNITY): Payer: Self-pay

## 2020-11-13 ENCOUNTER — Other Ambulatory Visit: Payer: Self-pay | Admitting: General Surgery

## 2020-11-13 ENCOUNTER — Telehealth: Payer: Self-pay

## 2020-11-13 ENCOUNTER — Encounter (HOSPITAL_COMMUNITY): Payer: Self-pay | Admitting: General Surgery

## 2020-11-13 ENCOUNTER — Other Ambulatory Visit (HOSPITAL_COMMUNITY): Payer: Self-pay | Admitting: General Surgery

## 2020-11-13 ENCOUNTER — Other Ambulatory Visit (HOSPITAL_COMMUNITY): Payer: Self-pay

## 2020-11-13 DIAGNOSIS — R911 Solitary pulmonary nodule: Secondary | ICD-10-CM | POA: Diagnosis not present

## 2020-11-13 DIAGNOSIS — R1031 Right lower quadrant pain: Secondary | ICD-10-CM | POA: Diagnosis not present

## 2020-11-13 DIAGNOSIS — C252 Malignant neoplasm of tail of pancreas: Secondary | ICD-10-CM

## 2020-11-13 DIAGNOSIS — R079 Chest pain, unspecified: Secondary | ICD-10-CM | POA: Diagnosis not present

## 2020-11-13 DIAGNOSIS — R0602 Shortness of breath: Secondary | ICD-10-CM | POA: Diagnosis not present

## 2020-11-13 DIAGNOSIS — Z9049 Acquired absence of other specified parts of digestive tract: Secondary | ICD-10-CM | POA: Diagnosis not present

## 2020-11-13 LAB — SARS CORONAVIRUS 2 (TAT 6-24 HRS): SARS Coronavirus 2: NEGATIVE

## 2020-11-13 MED ORDER — SODIUM CHLORIDE (PF) 0.9 % IJ SOLN
INTRAMUSCULAR | Status: AC
  Filled 2020-11-13: qty 50

## 2020-11-13 MED ORDER — IOHEXOL 350 MG/ML SOLN
100.0000 mL | Freq: Once | INTRAVENOUS | Status: AC | PRN
  Administered 2020-11-13: 100 mL via INTRAVENOUS

## 2020-11-13 NOTE — Telephone Encounter (Signed)
Oral Oncology Patient Advocate Encounter   Was successful in securing patient a $4500 grant from [employer] to provide copayment coverage for Xeloda.  This will keep the out of pocket expense at $0.     I have spoken with patient.    The billing information is as follows and has been shared with [facility].   Member ID: [id number] Group ID: [id number] RxBin: [id number] PCN: [id number] Dates of Eligibility: 11/13/20 through 11/13/21  Fund name:  Pancreatic.

## 2020-11-13 NOTE — Progress Notes (Signed)
Mrs Musgrave denies chest pain or shortness of breath. Patient was tested for Covid and has been in quarantine since that time.  Mrs Batte is having right and back pain, patient was seen by Dr. Barry Dienes this am, who ordered a CT Scan at [facility] this evening. Mrs. Kuzniar reports that Dr. Barry Dienes told patient she may take Ibuprofen.  Mrs Antony reports that PCP, Dr Aurther Loft  told her she has diabetes, and order Repaglnide.  Patient said that she took 2 doses and  was very short of breath, she is no longer taking the medication, patient did not notify PCP.  Mrs. Krabill does not have a CBG machine.

## 2020-11-13 NOTE — H&P (Signed)
Amanda Arnold Appointment: 11/13/2020 9:30 AM Location: [facility] Patient #: [id number] DOB: [birth year] Married / Language: Amanda Arnold / Race: White Female   History of Present Illness Amanda Klein MD; 11/13/2020 10:12 AM) The patient is a 75 year old female who presents for a follow-up for Pancreatic cancer. Pt is a 75 yo F who had an incidentally discovered pancreatic tail mass when she was undergoing workup for right sided flank pain and leg numbness. This started in November 2021. She also felt ill with diarrhea and no appetite. She lost around 20 pounds. She denied LUQ pain or early satiety. She already had cholecystectomy many years ago. She had EUS and biopsy, but FNA was not definitive for cancer. EUS staging would be uT2N0. She saw Dr. Benay Arnold and given diagnostic uncertainty, surgery was recommended first. She does not have breast/ovarian/pancreas cancer in her family history. Her mother had cervical cancer and a brother had AML.   Pt underwent robotic distal pancreatectomy/splenectomy 10/17/20. She did well post op other than leukocytosis and pain. She was discharged on POD 6 and was doing very well until a few days ago. She developed sharp right posterior chest pain that hurt upon taking deep breaths. She feels short of breath as well. She denies leg swelling. The pain has now migrated down the right posterior abdomen. She denies fever/chills. She isn't having nausea/vomiting. The left side of her abdomen is feeling fine. Her final path was T2N1. Because of this, chemotherapy is planned. She had been walking every day until the last few days with the pain.  pathology 10/17/20 A. DISTAL PANCREAS AND SPLEEN, DISTAL PANCREATECTOMY AND SPLENECTOMY: - Well differentiated adenocarcinoma arising in association with intraductal papillary mucinous neoplasm, 3.9 cm in greatest dimension. - Tumor invades peripancreatic soft tissues. - Margins of resection are not  involved. - Metastatic carcinoma in (2) of (15) lymph nodes.   Allergies Amanda Arnold, [place]; 11/13/2020 9:21 AM) Naproxen *ANALGESICS - ANTI-INFLAMMATORY*  Morphine Sulfate *ANALGESICS - OPIOID*  BuPROPion HCl *CHEMICALS*  Ezetimibe *ANTIHYPERLIPIDEMICS*  MetFORMIN HCl *CHEMICALS*  TraZODone HCl *ANTIDEPRESSANTS*  TraMADol HCl *CHEMICALS*  Allergies Reconciled   Medication History Amanda Arnold, CMA; 11/13/2020 9:22 AM) Omeprazole (40MG  Capsule DR, Oral) Active. Gabapentin (300MG  Capsule, Oral) Active. Rosuvastatin Calcium (10MG  Tablet, Oral) Active. Metoprolol Tartrate (100MG  Tablet, Oral) Active. ALPRAZolam (0.5MG  Tablet, Oral) Active. Hydrocodone-Acetaminophen (7.5-325MG  Tablet, Oral) Active. Metoprolol Succinate ER (25MG  Tablet ER 24HR, Oral) Active. Vitamin D (2000UNIT Capsule, Oral) Active. Nizoral (2% Cream, External) Active. Repaglinide (1MG  Tablet, Oral) Active. Medications Reconciled    Review of Systems Amanda Klein MD; 11/13/2020 10:10 AM) All other systems negative  Vitals Amanda Arnold CMA; 11/13/2020 9:21 AM) 11/13/2020 9:21 AM Weight: 166.6 lb Height: 62in Body Surface Area: 1.77 m Body Mass Index: 30.47 kg/m  Temp.: 59F  Pulse: 83 (Regular)        Physical Exam Amanda Klein MD; 11/13/2020 10:12 AM) Chest and Lung Exam Note: right posterior chest pain. shallow respirations.   Abdomen Note: abd soft, non distended. incisions healing well. pain along the right inferolateral abdomen and right flank     Assessment & Plan Amanda Klein MD; 11/13/2020 10:13 AM) PRIMARY CANCER OF TAIL OF PANCREAS (C25.2) Impression: Port planned tomorrow. Reviewed port placement and risks. Current Plans Pt Education - ccs port insertion education SHORTNESS OF BREATH [phone]) Impression: She is at high risk for PE given diagnosis. Wtih the chest pain and shortness of breath, I would like to rule out a PE. Current Plans CTA CHEST,  ABDOMEN, AND PELVIS WITHOUT THEN WITH CONTRAST (74174) - STAT CHEST PAIN ON BREATHING (R07.1) ABDOMINAL PAIN, ACUTE (R10.9) Impression: The pain that extends into her abdomen is likely musculoskeletal, but this may also reflect a vascular issue.

## 2020-11-14 ENCOUNTER — Ambulatory Visit (HOSPITAL_COMMUNITY): Payer: Medicare Other

## 2020-11-14 ENCOUNTER — Encounter (HOSPITAL_COMMUNITY): Payer: Self-pay | Admitting: General Surgery

## 2020-11-14 ENCOUNTER — Encounter (HOSPITAL_COMMUNITY)
Admission: RE | Disposition: A | Discharge status: Home / Self Care | Payer: Self-pay | Source: Home / Self Care | Attending: General Surgery

## 2020-11-14 ENCOUNTER — Ambulatory Visit (HOSPITAL_COMMUNITY)
Admission: RE | Admit: 2020-11-14 | Discharge: 2020-11-14 | Disposition: A | Discharge status: Home / Self Care | Payer: Medicare Other | Attending: General Surgery | Admitting: General Surgery

## 2020-11-14 ENCOUNTER — Ambulatory Visit (HOSPITAL_COMMUNITY): Payer: Medicare Other | Admitting: Anesthesiology

## 2020-11-14 ENCOUNTER — Other Ambulatory Visit: Payer: Self-pay | Admitting: *Deleted

## 2020-11-14 DIAGNOSIS — Z886 Allergy status to analgesic agent status: Secondary | ICD-10-CM | POA: Insufficient documentation

## 2020-11-14 DIAGNOSIS — Z452 Encounter for adjustment and management of vascular access device: Secondary | ICD-10-CM | POA: Diagnosis not present

## 2020-11-14 DIAGNOSIS — Z885 Allergy status to narcotic agent status: Secondary | ICD-10-CM | POA: Insufficient documentation

## 2020-11-14 DIAGNOSIS — Z888 Allergy status to other drugs, medicaments and biological substances status: Secondary | ICD-10-CM | POA: Insufficient documentation

## 2020-11-14 DIAGNOSIS — Z79899 Other long term (current) drug therapy: Secondary | ICD-10-CM | POA: Insufficient documentation

## 2020-11-14 DIAGNOSIS — Z87891 Personal history of nicotine dependence: Secondary | ICD-10-CM | POA: Insufficient documentation

## 2020-11-14 DIAGNOSIS — I10 Essential (primary) hypertension: Secondary | ICD-10-CM | POA: Diagnosis not present

## 2020-11-14 DIAGNOSIS — Z419 Encounter for procedure for purposes other than remedying health state, unspecified: Secondary | ICD-10-CM

## 2020-11-14 DIAGNOSIS — I700 Atherosclerosis of aorta: Secondary | ICD-10-CM | POA: Diagnosis not present

## 2020-11-14 DIAGNOSIS — Z95828 Presence of other vascular implants and grafts: Secondary | ICD-10-CM

## 2020-11-14 DIAGNOSIS — C252 Malignant neoplasm of tail of pancreas: Secondary | ICD-10-CM | POA: Insufficient documentation

## 2020-11-14 DIAGNOSIS — E785 Hyperlipidemia, unspecified: Secondary | ICD-10-CM | POA: Diagnosis not present

## 2020-11-14 DIAGNOSIS — Z7984 Long term (current) use of oral hypoglycemic drugs: Secondary | ICD-10-CM | POA: Insufficient documentation

## 2020-11-14 DIAGNOSIS — Z4682 Encounter for fitting and adjustment of non-vascular catheter: Secondary | ICD-10-CM | POA: Diagnosis not present

## 2020-11-14 DIAGNOSIS — E440 Moderate protein-calorie malnutrition: Secondary | ICD-10-CM | POA: Diagnosis not present

## 2020-11-14 HISTORY — DX: Malignant (primary) neoplasm, unspecified: C80.1

## 2020-11-14 HISTORY — PX: PORTACATH PLACEMENT: SHX2246

## 2020-11-14 HISTORY — DX: Constipation, unspecified: K59.00

## 2020-11-14 LAB — BASIC METABOLIC PANEL
Anion gap: 9 (ref 5–15)
BUN: 13 mg/dL (ref 8–23)
CO2: 23 mmol/L (ref 22–32)
Calcium: 9.3 mg/dL (ref 8.9–10.3)
Chloride: 104 mmol/L (ref 98–111)
Creatinine, Ser: 0.59 mg/dL (ref 0.44–1.00)
GFR, Estimated: >60 mL/min (ref >60)
Glucose, Bld: 121 mg/dL — ABNORMAL HIGH (ref 70–99)
Potassium: 3.6 mmol/L (ref 3.5–5.1)
Sodium: 136 mmol/L (ref 135–145)

## 2020-11-14 LAB — GLUCOSE, CAPILLARY
Glucose-Capillary: 132 mg/dL — ABNORMAL HIGH (ref 70–99)
Glucose-Capillary: 106 mg/dL — ABNORMAL HIGH (ref 70–99)
Glucose-Capillary: 112 mg/dL — ABNORMAL HIGH (ref 70–99)

## 2020-11-14 SURGERY — INSERTION, TUNNELED CENTRAL VENOUS DEVICE, WITH PORT
Anesthesia: General

## 2020-11-14 MED ORDER — EPHEDRINE 5 MG/ML INJ
INTRAVENOUS | Status: AC
  Filled 2020-11-14: qty 10

## 2020-11-14 MED ORDER — FENTANYL CITRATE (PF) 100 MCG/2ML IJ SOLN
25.0000 ug | INTRAMUSCULAR | Status: DC | PRN
  Administered 2020-11-14: 25 ug via INTRAVENOUS

## 2020-11-14 MED ORDER — FENTANYL CITRATE (PF) 100 MCG/2ML IJ SOLN
INTRAMUSCULAR | Status: AC
  Filled 2020-11-14: qty 2

## 2020-11-14 MED ORDER — LIDOCAINE-PRILOCAINE 2.5-2.5 % EX CREA: 1.0000 "application " | TOPICAL_CREAM | CUTANEOUS | 5 refills | Status: DC

## 2020-11-14 MED ORDER — ORAL CARE MOUTH RINSE: 15.0000 mL | Freq: Once | OROMUCOSAL | Status: AC

## 2020-11-14 MED ORDER — ACETAMINOPHEN 500 MG PO TABS
1000.0000 mg | ORAL_TABLET | ORAL | Status: AC
  Administered 2020-11-14: 1000 mg via ORAL

## 2020-11-14 MED ORDER — PHENYLEPHRINE 40 MCG/ML (10ML) SYRINGE FOR IV PUSH (FOR BLOOD PRESSURE SUPPORT)
PREFILLED_SYRINGE | INTRAVENOUS | Status: AC
  Filled 2020-11-14: qty 10

## 2020-11-14 MED ORDER — HYDROCODONE-ACETAMINOPHEN 7.5-325 MG PO TABS: 1.0000 | ORAL_TABLET | Freq: Three times a day (TID) | ORAL | 0 refills | Status: DC | PRN

## 2020-11-14 MED ORDER — LACTATED RINGERS IV SOLN: INTRAVENOUS | Status: DC

## 2020-11-14 MED ORDER — LIDOCAINE 2% (20 MG/ML) 5 ML SYRINGE
INTRAMUSCULAR | Status: DC | PRN
  Administered 2020-11-14: 80 mg via INTRAVENOUS

## 2020-11-14 MED ORDER — LIDOCAINE HCL 1 % IJ SOLN
INTRAMUSCULAR | Status: AC
  Filled 2020-11-14: qty 20

## 2020-11-14 MED ORDER — HEPARIN SOD (PORK) LOCK FLUSH 100 UNIT/ML IV SOLN
INTRAVENOUS | Status: DC | PRN
  Administered 2020-11-14: 500 [IU] via INTRAVENOUS

## 2020-11-14 MED ORDER — CHLORHEXIDINE GLUCONATE CLOTH 2 % EX PADS: 6.0000 | MEDICATED_PAD | Freq: Once | CUTANEOUS | Status: DC

## 2020-11-14 MED ORDER — CHLORHEXIDINE GLUCONATE 0.12 % MT SOLN: 15.0000 mL | Freq: Once | OROMUCOSAL | Status: AC

## 2020-11-14 MED ORDER — 0.9 % SODIUM CHLORIDE (POUR BTL) OPTIME
TOPICAL | Status: DC | PRN
  Administered 2020-11-14: 1000 mL

## 2020-11-14 MED ORDER — ONDANSETRON HCL 4 MG/2ML IJ SOLN
INTRAMUSCULAR | Status: DC | PRN
  Administered 2020-11-14: 4 mg via INTRAVENOUS

## 2020-11-14 MED ORDER — SODIUM CHLORIDE 0.9 % IV SOLN
INTRAVENOUS | Status: DC | PRN
  Administered 2020-11-14: 500 mL

## 2020-11-14 MED ORDER — PHENYLEPHRINE 40 MCG/ML (10ML) SYRINGE FOR IV PUSH (FOR BLOOD PRESSURE SUPPORT)
PREFILLED_SYRINGE | INTRAVENOUS | Status: DC | PRN
  Administered 2020-11-14: 120 ug via INTRAVENOUS
  Administered 2020-11-14 (×2): 80 ug via INTRAVENOUS

## 2020-11-14 MED ORDER — BUPIVACAINE-EPINEPHRINE (PF) 0.25% -1:200000 IJ SOLN
INTRAMUSCULAR | Status: AC
  Filled 2020-11-14: qty 30

## 2020-11-14 MED ORDER — FENTANYL CITRATE (PF) 250 MCG/5ML IJ SOLN
INTRAMUSCULAR | Status: AC
  Filled 2020-11-14: qty 5

## 2020-11-14 MED ORDER — ACETAMINOPHEN 500 MG PO TABS
ORAL_TABLET | ORAL | Status: AC
  Filled 2020-11-14: qty 2

## 2020-11-14 MED ORDER — EPHEDRINE SULFATE-NACL 50-0.9 MG/10ML-% IV SOSY
PREFILLED_SYRINGE | INTRAVENOUS | Status: DC | PRN
  Administered 2020-11-14: 10 mg via INTRAVENOUS

## 2020-11-14 MED ORDER — FENTANYL CITRATE (PF) 100 MCG/2ML IJ SOLN
INTRAMUSCULAR | Status: DC | PRN
  Administered 2020-11-14: 25 ug via INTRAVENOUS
  Administered 2020-11-14: 50 ug via INTRAVENOUS

## 2020-11-14 MED ORDER — PROPOFOL 10 MG/ML IV BOLUS
INTRAVENOUS | Status: DC | PRN
  Administered 2020-11-14: 140 mg via INTRAVENOUS

## 2020-11-14 MED ORDER — CHLORHEXIDINE GLUCONATE 0.12 % MT SOLN
OROMUCOSAL | Status: AC
  Administered 2020-11-14: 15 mL via OROMUCOSAL
  Filled 2020-11-14: qty 15

## 2020-11-14 MED ORDER — LIDOCAINE HCL 1 % IJ SOLN
INTRAMUSCULAR | Status: DC | PRN
  Administered 2020-11-14: 20 mL

## 2020-11-14 MED ORDER — PROCHLORPERAZINE MALEATE 10 MG PO TABS: 10.0000 mg | ORAL_TABLET | Freq: Four times a day (QID) | ORAL | 1 refills | Status: DC | PRN

## 2020-11-14 MED ORDER — DEXAMETHASONE SODIUM PHOSPHATE 10 MG/ML IJ SOLN
INTRAMUSCULAR | Status: DC | PRN
  Administered 2020-11-14: 5 mg via INTRAVENOUS

## 2020-11-14 MED ORDER — HEPARIN SOD (PORK) LOCK FLUSH 100 UNIT/ML IV SOLN
INTRAVENOUS | Status: AC
  Filled 2020-11-14: qty 5

## 2020-11-14 MED ORDER — CEFAZOLIN SODIUM-DEXTROSE 2-4 GM/100ML-% IV SOLN
2.0000 g | INTRAVENOUS | Status: AC
  Administered 2020-11-14: 2 g via INTRAVENOUS

## 2020-11-14 MED ORDER — ONDANSETRON HCL 8 MG PO TABS: 8.0000 mg | ORAL_TABLET | Freq: Three times a day (TID) | ORAL | 1 refills | Status: DC | PRN

## 2020-11-14 MED ORDER — LIDOCAINE 2% (20 MG/ML) 5 ML SYRINGE
INTRAMUSCULAR | Status: AC
  Filled 2020-11-14: qty 5

## 2020-11-14 MED ORDER — SODIUM CHLORIDE 0.9 % IV SOLN
INTRAVENOUS | Status: AC
  Filled 2020-11-14: qty 1.2

## 2020-11-14 MED ORDER — PROPOFOL 10 MG/ML IV BOLUS
INTRAVENOUS | Status: AC
  Filled 2020-11-14: qty 20

## 2020-11-14 SURGICAL SUPPLY — 43 items
ADH SKN CLS APL DERMABOND .7 (GAUZE/BANDAGES/DRESSINGS) ×1
APL PRP STRL LF DISP 70% ISPRP (MISCELLANEOUS) ×1
BAG DECANTER FOR FLEXI CONT (MISCELLANEOUS) ×2 IMPLANT
CANISTER SUCT 3000ML PPV (MISCELLANEOUS) IMPLANT
CHLORAPREP W/TINT 26 (MISCELLANEOUS) ×2 IMPLANT
COVER SURGICAL LIGHT HANDLE (MISCELLANEOUS) ×2 IMPLANT
COVER TRANSDUCER ULTRASND GEL (DISPOSABLE) IMPLANT
COVER WAND RF STERILE (DRAPES) ×2 IMPLANT
DECANTER SPIKE VIAL GLASS SM (MISCELLANEOUS) ×4 IMPLANT
DERMABOND ADVANCED (GAUZE/BANDAGES/DRESSINGS) ×1
DERMABOND ADVANCED .7 DNX12 (GAUZE/BANDAGES/DRESSINGS) ×1 IMPLANT
DRAPE C-ARM 42X120 X-RAY (DRAPES) ×2 IMPLANT
DRAPE CHEST BREAST 15X10 FENES (DRAPES) ×2 IMPLANT
DRAPE WARM FLUID 44X44 (DRAPES) IMPLANT
ELECT COATED BLADE 2.86 ST (ELECTRODE) ×2 IMPLANT
ELECT REM PT RETURN 9FT ADLT (ELECTROSURGICAL) ×2
ELECTRODE REM PT RTRN 9FT ADLT (ELECTROSURGICAL) ×1 IMPLANT
GAUZE 4X4 16PLY RFD (DISPOSABLE) ×2 IMPLANT
GEL ULTRASOUND 20GR AQUASONIC (MISCELLANEOUS) IMPLANT
GLOVE BIO SURGEON STRL SZ 6 (GLOVE) ×2 IMPLANT
GLOVE SURG UNDER LTX SZ6.5 (GLOVE) ×2 IMPLANT
GOWN STRL REUS W/ TWL LRG LVL3 (GOWN DISPOSABLE) ×1 IMPLANT
GOWN STRL REUS W/TWL 2XL LVL3 (GOWN DISPOSABLE) ×2 IMPLANT
GOWN STRL REUS W/TWL LRG LVL3 (GOWN DISPOSABLE) ×2
KIT BASIN OR (CUSTOM PROCEDURE TRAY) ×2 IMPLANT
KIT PORT POWER 8FR ISP CVUE (Port) ×1 IMPLANT
KIT TURNOVER KIT B (KITS) ×2 IMPLANT
MESH VENTRALIGHT ST 8X10 (Mesh General) ×1 IMPLANT
NEEDLE 22X1 1/2 (OR ONLY) (NEEDLE) ×2 IMPLANT
NS IRRIG 1000ML POUR BTL (IV SOLUTION) ×2 IMPLANT
PAD ARMBOARD 7.5X6 YLW CONV (MISCELLANEOUS) ×2 IMPLANT
PENCIL BUTTON HOLSTER BLD 10FT (ELECTRODE) ×2 IMPLANT
POSITIONER HEAD DONUT 9IN (MISCELLANEOUS) ×2 IMPLANT
SUT MON AB 4-0 PC3 18 (SUTURE) ×2 IMPLANT
SUT PROLENE 2 0 SH DA (SUTURE) ×4 IMPLANT
SUT VIC AB 3-0 SH 27 (SUTURE) ×2
SUT VIC AB 3-0 SH 27X BRD (SUTURE) ×1 IMPLANT
SYR 5ML LUER SLIP (SYRINGE) ×2 IMPLANT
TOWEL GREEN STERILE (TOWEL DISPOSABLE) ×2 IMPLANT
TOWEL GREEN STERILE FF (TOWEL DISPOSABLE) ×2 IMPLANT
TRAY LAPAROSCOPIC MC (CUSTOM PROCEDURE TRAY) ×2 IMPLANT
TUBE CONNECTING 12X1/4 (SUCTIONS) IMPLANT
YANKAUER SUCT BULB TIP NO VENT (SUCTIONS) IMPLANT

## 2020-11-14 NOTE — Anesthesia Procedure Notes (Signed)
Procedure Name: LMA Insertion Date/Time: 11/14/2020 6:00 PM Performed by: Reece Agar, CRNA Pre-anesthesia Checklist: Patient identified, Emergency Drugs available, Suction available and Patient being monitored Patient Re-evaluated:Patient Re-evaluated prior to induction Oxygen Delivery Method: Circle System Utilized Preoxygenation: Pre-oxygenation with 100% oxygen Induction Type: IV induction Ventilation: Mask ventilation without difficulty LMA: LMA inserted LMA Size: 4.0 Number of attempts: 1 Airway Equipment and Method: Bite block Placement Confirmation: positive ETCO2 Tube secured with: Tape Dental Injury: Teeth and Oropharynx as per pre-operative assessment

## 2020-11-14 NOTE — Op Note (Signed)
PREOPERATIVE DIAGNOSIS:  Adenocarcinoma of the pancreatic tail     POSTOPERATIVE DIAGNOSIS:  Same     PROCEDURE: left subclavian port placement, Bard ClearVue Power Port, MRI safe, 8-French.      SURGEON:  [name], MD      ANESTHESIA:  General   FINDINGS:  Good venous return, easy flush, and tip of the catheter and   SVC 24.5 cm.      SPECIMEN:  None.      ESTIMATED BLOOD LOSS:  Minimal.      COMPLICATIONS:  None known.      PROCEDURE:  Pt was identified in the holding area and taken to   the operating room, where patient was placed supine on the operating room   table.  General anesthesia was induced.  Patient's arms were tucked and the upper   chest and neck were prepped and draped in sterile fashion.  Time-out was   performed according to the surgical safety check list.  When all was   correct, we continued.   Local anesthetic was administered over this   area at the angle of the clavicle.  The vein was accessed with 1 pass(es) of the needle. There was good venous return and the wire passed easily with no ectopy.   Fluoroscopy was used to confirm that the wire was in the vena cava.      The patient was placed back level and the area for the pocket was anethetized   with local anesthetic.  A 3-cm transverse incision was made with a #15   blade.  Cautery was used to divide the subcutaneous tissues down to the   pectoralis muscle.  An [employer] retractor was used to elevate the skin   while a pocket was created on top of the pectoralis fascia.  The port   was placed into the pocket to confirm that it was of adequate size.  The   catheter was preattached to the port.  The port was then secured to the   pectoralis fascia with four 2-0 Prolene sutures.  These were clamped and   not tied down yet.    The catheter was tunneled through to the wire exit   site.  The catheter was placed along the wire to determine what length it should be to be in the SVC.  The catheter was cut at  24.5 cm.  The tunneler sheath and dilator were passed over the wire and the dilator and wire were removed.  The catheter was advanced through the tunneler sheath and the tunneler sheath was pulled away.  Care was taken to keep the catheter in the tunneler sheath as this occurred. This was advanced and the tunneler sheath was removed.  There was good venous   return and easy flush of the catheter.  The Prolene sutures were tied   down to the pectoral fascia.  The skin was reapproximated using 3-0   Vicryl interrupted deep dermal sutures.    Fluoroscopy was used to re-confirm good position of the catheter.  The skin   was then closed using 4-0 Monocryl in a subcuticular fashion.  The port was flushed with concentrated heparin flush as well.  The wounds were then cleaned, dried, and dressed with Dermabond.  The patient was awakened from anesthesia and taken to the PACU in stable condition.  Needle, sponge, and instrument counts were correct.               [name], MD

## 2020-11-14 NOTE — Discharge Instructions (Addendum)
[facility] Office Phone Number [phone]   POST OP INSTRUCTIONS  Always review your discharge instruction sheet given to you by the facility where your surgery was performed.  IF YOU HAVE DISABILITY OR FAMILY LEAVE FORMS, YOU MUST BRING THEM TO THE OFFICE FOR PROCESSING.  DO NOT GIVE THEM TO YOUR DOCTOR.  1. A prescription for pain medication may be given to you upon discharge.  Take your pain medication as prescribed, if needed.  If narcotic pain medicine is not needed, then you may take acetaminophen (Tylenol) or ibuprofen (Advil) as needed. 2. Take your usually prescribed medications unless otherwise directed 3. If you need a refill on your pain medication, please contact your pharmacy.  They will contact our office to request authorization.  Prescriptions will not be filled after 5pm or on week-ends. 4. You should eat very light the first 24 hours after surgery, such as soup, crackers, pudding, etc.  Resume your normal diet the day after surgery 5. It is common to experience some constipation if taking pain medication after surgery.  Increasing fluid intake and taking a stool softener will usually help or prevent this problem from occurring.  A mild laxative (Milk of Magnesia or Miralax) should be taken according to package directions if there are no bowel movements after 48 hours. 6. You may shower in 48 hours.  The surgical glue will flake off in 2-3 weeks.   7. ACTIVITIES:  No strenuous activity or heavy lifting for 1 week.   a. You may drive when you no longer are taking prescription pain medication, you can comfortably wear a seatbelt, and you can safely maneuver your car and apply brakes. b. RETURN TO WORK:  __________n/a_______________ You should see your doctor in the office for a follow-up appointment approximately three-four weeks after your surgery.    WHEN TO CALL YOUR DOCTOR: 1. Fever over 101.0 2. Nausea and/or vomiting. 3. Extreme swelling or  bruising. 4. Continued bleeding from incision. 5. Increased pain, redness, or drainage from the incision.  The clinic staff is available to answer your questions during regular business hours.  Please don't hesitate to call and ask to speak to one of the nurses for clinical concerns.  If you have a medical emergency, go to the nearest emergency room or call 911.  A surgeon from [facility] is always on call at the hospital.  For further questions, please visit [email or web address]      

## 2020-11-14 NOTE — Anesthesia Preprocedure Evaluation (Addendum)
Anesthesia Evaluation  Patient identified by MRN, date of birth, ID band Patient awake    Reviewed: Allergy & Precautions, NPO status   Airway Mallampati: II  TM Distance: >3 FB     Dental   Pulmonary shortness of breath, sleep apnea , COPD, former smoker,    breath sounds clear to auscultation       Cardiovascular hypertension, + CAD   Rhythm:Regular Rate:Normal     Neuro/Psych  Headaches, PSYCHIATRIC DISORDERS Anxiety Depression  Neuromuscular disease    GI/Hepatic Neg liver ROS, GERD  ,  Endo/Other  diabetes  Renal/GU Renal disease     Musculoskeletal  (+) Arthritis ,   Abdominal   Peds  Hematology   Anesthesia Other Findings   Reproductive/Obstetrics                            Anesthesia Physical Anesthesia Plan  ASA: III  Anesthesia Plan: General   Post-op Pain Management:    Induction:   PONV Risk Score and Plan: 3 and Ondansetron, Dexamethasone and Midazolam  Airway Management Planned: LMA  Additional Equipment:   Intra-op Plan:   Post-operative Plan: Extubation in OR  Informed Consent: I have reviewed the patients History and Physical, chart, labs and discussed the procedure including the risks, benefits and alternatives for the proposed anesthesia with the patient or authorized representative who has indicated his/her understanding and acceptance.       Plan Discussed with: CRNA and Anesthesiologist  Anesthesia Plan Comments:         Anesthesia Quick Evaluation

## 2020-11-14 NOTE — Transfer of Care (Signed)
Immediate Anesthesia Transfer of Care Note  Patient: Amanda Arnold  Procedure(s) Performed: INSERTION PORT-A-CATH (N/A )  Patient Location: PACU  Anesthesia Type:General  Level of Consciousness: awake and alert   Airway & Oxygen Therapy: Patient Spontanous Breathing and Patient connected to face mask oxygen  Post-op Assessment: Report given to RN and Post -op Vital signs reviewed and stable  Post vital signs: Reviewed and stable  Last Vitals:  Vitals Value Taken Time  BP 121/58 11/14/20 1849  Temp    Pulse 80 11/14/20 1850  Resp 21 11/14/20 1850  SpO2 99 % 11/14/20 1850  Vitals shown include unvalidated device data.  Last Pain:  Vitals:   11/14/20 1241  TempSrc:   PainSc: 3       Patients Stated Pain Goal: 3 (09/73/53 2992)  Complications: No complications documented.

## 2020-11-14 NOTE — Interval H&P Note (Signed)
History and Physical Interval Note:  11/14/2020 5:21 PM  Amanda Arnold  has presented today for surgery, with the diagnosis of pancreatic cancer.  The various methods of treatment have been discussed with the patient and family. After consideration of risks, benefits and other options for treatment, the patient has consented to  Procedure(s): INSERTION PORT-A-CATH (N/A) as a surgical intervention.  The patient's history has been reviewed, patient examined, no change in status, stable for surgery.  I have reviewed the patient's chart and labs.  Questions were answered to the patient's satisfaction.     [name]

## 2020-11-14 NOTE — Anesthesia Postprocedure Evaluation (Signed)
Anesthesia Post Note  Patient: Amanda Arnold  Procedure(s) Performed: INSERTION PORT-A-CATH (N/A )     Patient location during evaluation: PACU Anesthesia Type: General Level of consciousness: sedated Pain management: pain level controlled Vital Signs Assessment: post-procedure vital signs reviewed and stable Respiratory status: spontaneous breathing and respiratory function stable Cardiovascular status: stable Postop Assessment: no apparent nausea or vomiting Anesthetic complications: no   No complications documented.  Last Vitals:  Vitals:   11/14/20 1905 11/14/20 1920  BP: (!) 117/59 123/64  Pulse: 72 66  Resp: 15 14  Temp:  (!) 36.1 C  SpO2: 98% 98%    Last Pain:  Vitals:   11/14/20 1920  TempSrc:   PainSc: 5                  Chrystal Zeimet DANIEL

## 2020-11-15 ENCOUNTER — Encounter (HOSPITAL_COMMUNITY): Payer: Self-pay | Admitting: General Surgery

## 2020-11-15 ENCOUNTER — Other Ambulatory Visit (HOSPITAL_COMMUNITY): Payer: Self-pay

## 2020-11-15 NOTE — Telephone Encounter (Signed)
Oral Chemotherapy Pharmacist Encounter  I spoke with patient for overview of: Xeloda (capecitabine) for the adjuvant treatment of stage IIb pancreatic cancer in conjunction with gemcitabine, planned duration of ~6 cycles.  Counseled patient on administration, dosing, side effects, monitoring, drug-food interactions, safe handling, storage, and disposal.  Patient will take Xeloda 500mg  tablets, 3 tablets (1500mg ) by mouth in AM and 3 tabs (1500mg ) by mouth in PM, within 30 minutes of finishing meals, on days 1-21 of each 28 day cycle.   Xeloda and gemcitabine start date: 11/21/20  Adverse effects include but are not limited to: fatigue, decreased blood counts, GI upset, diarrhea, mouth sores, and hand-foot syndrome.  Patient has anti-emetic on hand and knows to take it if nausea develops.   Patient will obtain anti diarrheal and alert the office of 4 or more loose stools above baseline.  Reviewed with patient importance of keeping a medication schedule and plan for any missed doses. No barriers to medication adherence identified.  Medication reconciliation performed and medication/allergy list updated. Per Dr. Benay Spice patient will stop taking omeprazole due to PPI's decreasing efficacy of Xeloda. Patient made aware and expressed understanding.   Insurance authorization for Xeloda has been obtained. This will ship from the [facility] on 11/15/20 to deliver to patient's home on 11/16/20. Patient knows not to start therapy until 11/21/20.  Patient informed the pharmacy will reach out 5-7 days prior to needing next fill of Xeloda to coordinate continued medication acquisition to prevent break in therapy.  All questions answered.  Ms. Mosher voiced understanding and appreciation.   Medication education handout placed in mail for patient. Patient knows to call the office with questions or concerns. Oral Chemotherapy Clinic phone number provided to patient.   Leron Croak,  PharmD, BCPS Hematology/Oncology Clinical Pharmacist [facility] [phone] 11/15/2020 1:25 PM

## 2020-11-15 NOTE — Addendum Note (Signed)
Addendum  created 11/15/20 1819 by Duane Boston, MD   [place] recorded in [place], [place] filed

## 2020-11-17 ENCOUNTER — Other Ambulatory Visit: Payer: Self-pay | Admitting: Internal Medicine

## 2020-11-18 ENCOUNTER — Other Ambulatory Visit: Payer: Self-pay | Admitting: Oncology

## 2020-11-21 ENCOUNTER — Inpatient Hospital Stay: Payer: Medicare Other | Admitting: Oncology

## 2020-11-21 ENCOUNTER — Inpatient Hospital Stay: Payer: Medicare Other | Attending: Oncology

## 2020-11-21 ENCOUNTER — Inpatient Hospital Stay: Payer: Medicare Other

## 2020-11-21 ENCOUNTER — Other Ambulatory Visit: Payer: Self-pay

## 2020-11-21 VITALS — BP 135/65 | HR 62 | Temp 98.2°F | Resp 18 | Ht 62.0 in | Wt 165.2 lb

## 2020-11-21 DIAGNOSIS — C252 Malignant neoplasm of tail of pancreas: Secondary | ICD-10-CM

## 2020-11-21 DIAGNOSIS — J449 Chronic obstructive pulmonary disease, unspecified: Secondary | ICD-10-CM | POA: Insufficient documentation

## 2020-11-21 DIAGNOSIS — Z87891 Personal history of nicotine dependence: Secondary | ICD-10-CM | POA: Diagnosis not present

## 2020-11-21 DIAGNOSIS — B372 Candidiasis of skin and nail: Secondary | ICD-10-CM | POA: Diagnosis not present

## 2020-11-21 DIAGNOSIS — I10 Essential (primary) hypertension: Secondary | ICD-10-CM | POA: Insufficient documentation

## 2020-11-21 DIAGNOSIS — Z79899 Other long term (current) drug therapy: Secondary | ICD-10-CM | POA: Diagnosis not present

## 2020-11-21 DIAGNOSIS — Z5111 Encounter for antineoplastic chemotherapy: Secondary | ICD-10-CM | POA: Insufficient documentation

## 2020-11-21 LAB — CMP (CANCER CENTER ONLY)
ALT: 18 U/L (ref 0–44)
AST: 33 U/L (ref 15–41)
Albumin: 4.2 g/dL (ref 3.5–5.0)
Alkaline Phosphatase: 54 U/L (ref 38–126)
Anion gap: 9 (ref 5–15)
BUN: 17 mg/dL (ref 8–23)
CO2: 26 mmol/L (ref 22–32)
Calcium: 9.2 mg/dL (ref 8.9–10.3)
Chloride: 104 mmol/L (ref 98–111)
Creatinine: 0.55 mg/dL (ref 0.44–1.00)
GFR, Estimated: >60 mL/min (ref >60)
Glucose, Bld: 114 mg/dL — ABNORMAL HIGH (ref 70–99)
Potassium: 4 mmol/L (ref 3.5–5.1)
Sodium: 139 mmol/L (ref 135–145)
Total Bilirubin: 0.4 mg/dL (ref 0.3–1.2)
Total Protein: 7 g/dL (ref 6.5–8.1)

## 2020-11-21 MED ORDER — SODIUM CHLORIDE 0.9 % IV SOLN
Freq: Once | INTRAVENOUS | Status: AC
  Filled 2020-11-21: qty 250

## 2020-11-21 MED ORDER — HEPARIN SOD (PORK) LOCK FLUSH 100 UNIT/ML IV SOLN
500.0000 [IU] | Freq: Once | INTRAVENOUS | Status: AC | PRN
  Administered 2020-11-21: 500 [IU]
  Filled 2020-11-21: qty 5

## 2020-11-21 MED ORDER — FLUCONAZOLE 100 MG PO TABS: 100.0000 mg | ORAL_TABLET | Freq: Every day | ORAL | 0 refills | Status: DC

## 2020-11-21 MED ORDER — SODIUM CHLORIDE 0.9% FLUSH
10.0000 mL | INTRAVENOUS | Status: DC | PRN
  Administered 2020-11-21: 10 mL
  Filled 2020-11-21: qty 10

## 2020-11-21 MED ORDER — NYSTATIN 100000 UNIT/GM EX POWD: 1.0000 "application " | Freq: Three times a day (TID) | CUTANEOUS | 2 refills | Status: DC

## 2020-11-21 MED ORDER — PROCHLORPERAZINE MALEATE 10 MG PO TABS
10.0000 mg | ORAL_TABLET | Freq: Once | ORAL | Status: AC
  Administered 2020-11-21: 10 mg via ORAL
  Filled 2020-11-21: qty 1

## 2020-11-21 MED ORDER — SODIUM CHLORIDE 0.9 % IV SOLN
1000.0000 mg/m2 | Freq: Once | INTRAVENOUS | Status: AC
  Administered 2020-11-21: 1824 mg via INTRAVENOUS
  Filled 2020-11-21: qty 47.97

## 2020-11-21 NOTE — Patient Instructions (Signed)

## 2020-11-21 NOTE — Progress Notes (Signed)
  [facility] OFFICE PROGRESS NOTE   Diagnosis: Pancreas cancer  INTERVAL HISTORY:   Amanda Arnold returns as scheduled.  She underwent Port-A-Cath placement by Dr. Barry Dienes on 11/14/2020.  She has attended a chemotherapy teaching class.  She started Xeloda earlier today.  She has noted a rash in the groin and beneath the breast.  She is using Vaseline.  Goldbond powder did not help.  Objective:  Vital signs in last 24 hours:  Blood pressure 135/65, pulse 62, temperature 98.2 F (36.8 C), temperature source Oral, resp. rate 18, height 5\' 2"  (1.575 m), weight 165 lb 3.2 oz (74.9 kg), SpO2 98 %.     Resp: Lungs clear bilaterally Cardio: Regular rate and rhythm GI: No hepatosplenomegaly, healed surgical incision Vascular: No leg edema  Skin: Erythematous rash consistent with a yeast rash beneath both breast and in the bilateral groin  Portacath/PICC-without erythema  Lab Results:  Lab Results  Component Value Date   WBC 10.7 (H) 10/29/2020   HGB 12.2 10/29/2020   HCT 37.2 10/29/2020   MCV 92.2 10/29/2020   PLT 535.0 Repeated and verified X2. (H) 10/29/2020   NEUTROABS 7.0 10/29/2020    CMP  Lab Results  Component Value Date   NA 139 11/21/2020   K 4.0 11/21/2020   CL 104 11/21/2020   CO2 26 11/21/2020   GLUCOSE 114 (H) 11/21/2020   BUN 17 11/21/2020   CREATININE 0.55 11/21/2020   CALCIUM 9.2 11/21/2020   PROT 7.0 11/21/2020   ALBUMIN 4.2 11/21/2020   AST 33 11/21/2020   ALT 18 11/21/2020   ALKPHOS 54 11/21/2020   BILITOT 0.4 11/21/2020   GFRNONAA >60 11/21/2020   GFRAA 103 04/20/2020   Medications: I have reviewed the patient's current medications.   Assessment/Plan: 1. Pancreas cancer-stage IIb (pT2pN1)  CT abdomen/pelvis 06/01/2020-2.9 x 3 cm pancreas tail mass, slight increase in size of 7 mm right middle lobe nodule  MRI/MRCP 06/22/2020-solid 3.8 x 3 x 3.1 cm pancreas tail mass with focal occlusion of the splenic vein and dilated venous  collaterals, no evidence of metastatic disease, bilateral adrenal adenomas  CT renal stone study 08/02/2020-scattered airspace opacities throughout the lung bases-new favored to represent infectious/inflammatory changes  EUS 08/16/2020-pancreas tail mass, T2N0, FNA biopsy-suspicious for malignancy  CTs 08/27/2020-unchanged right middle lobe nodule, superficial left upper back lesion, no change in pancreas tail mass, occluded splenic vein with collaterals at the splenic hilum, no enlarged abdominal pelvic lymph nodes  Distal pancreatectomy/splenectomy 10/17/2020- 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  2/15 lymph nodes positive, positive lymphovascular and perineural invasion, resection margins negative, pT2pN1  Cycle 1 gemcitabine/capecitabine 11/21/2020 2.   Gastric ulcer on EUS 08/16/2020-H. pylori positive 3.   COVID-19 infection January 2022 4.   COPD 5.   Hypertension 6.   Right abdomen/right leg pain-musculoskeletal? 7.   Right middle lobe nodule on CT 06/01/2020, increased from 2017, stable on CT 11/13/2020 8.   History of tobacco use 9.   Superficial left upper back lesion noted on CT 08/27/2020      Disposition: Amanda Arnold appears well.  She will begin cycle 1 gemcitabine/capecitabine today.  She will call for diarrhea.  She will return for an office visit and gemcitabine in 2 weeks.  Amanda Arnold has a yeast rash at the breast and groin.  We prescribed Diflucan and nystatin powder.  Amanda Coder, MD  11/21/2020  12:36 PM

## 2020-11-21 NOTE — Patient Instructions (Signed)
Amanda Arnold    Discharge Instructions:  Thank you for choosing [facility] to provide your oncology and hematology care.   If you have a lab appointment with the [facility], please go directly to the [facility] and check in at the registration area.   Wear comfortable clothing and clothing appropriate for easy access to any Portacath or PICC line.   We strive to give you quality time with your provider. You may need to reschedule your appointment if you arrive late (15 or more minutes).  Arriving late affects you and other patients whose appointments are after yours.  Also, if you miss three or more appointments without notifying the office, you may be dismissed from the clinic at the provider's discretion.      For prescription refill requests, have your pharmacy contact our office and allow 72 hours for refills to be completed.    Today you received the following chemotherapy and/or immunotherapy agents Gemcitabine   To help prevent nausea and vomiting after your treatment, we encourage you to take your nausea medication as directed.  BELOW ARE SYMPTOMS THAT SHOULD BE REPORTED IMMEDIATELY: . *FEVER GREATER THAN 100.4 F (38 C) OR HIGHER . *CHILLS OR SWEATING . *NAUSEA AND VOMITING THAT IS NOT CONTROLLED WITH YOUR NAUSEA MEDICATION . *UNUSUAL SHORTNESS OF BREATH . *UNUSUAL BRUISING OR BLEEDING . *URINARY PROBLEMS (pain or burning when urinating, or frequent urination) . *BOWEL PROBLEMS (unusual diarrhea, constipation, pain near the anus) . TENDERNESS IN MOUTH AND THROAT WITH OR WITHOUT PRESENCE OF ULCERS (sore throat, sores in mouth, or a toothache) . UNUSUAL RASH, SWELLING OR PAIN  . UNUSUAL VAGINAL DISCHARGE OR ITCHING   Items with * indicate a potential emergency and should be followed up as soon as possible or go to the Emergency Department if any problems should occur.  Please show the CHEMOTHERAPY ALERT CARD or IMMUNOTHERAPY ALERT  CARD at check-in to the Emergency Department and triage nurse.  Should you have questions after your visit or need to cancel or reschedule your appointment, please contact [facility]  Dept: [phone]  and follow the prompts.  Office hours are 8:00 a.m. to 4:30 p.m. Monday - Friday. Please note that voicemails left after 4:00 p.m. may not be returned until the following business day.  We are closed weekends and major holidays. You have access to a nurse at all times for urgent questions. Please call the main number to the clinic Dept: [phone] and follow the prompts.   For any non-urgent questions, you may also contact your provider using MyChart. We now offer e-Visits for anyone 56 and older to request care online for non-urgent symptoms. For details visit [email or web address].   Also download the MyChart app! Go to the app store, search "MyChart", open the app, select [facility], and log in with your MyChart username and password.  Due to Covid, a mask is required upon entering the hospital/clinic. If you do not have a mask, one will be given to you upon arrival. For doctor visits, patients may have 1 support person aged 24 or older with them. For treatment visits, patients cannot have anyone with them due to current Covid guidelines and our immunocompromised population.    Gemcitabine injection What is this medicine? GEMCITABINE (jem SYE ta been) is a chemotherapy drug. This medicine is used to treat many types of cancer like breast cancer, lung cancer, pancreatic cancer, and ovarian cancer. This medicine may  be used for other purposes; ask your health care provider or pharmacist if you have questions. COMMON BRAND NAME(S): Gemzar, Infugem What should I tell my health care provider before I take this medicine? They need to know if you have any of these conditions:  blood disorders  infection  kidney disease  liver disease  lung or breathing  disease, like asthma  recent or ongoing radiation therapy  an unusual or allergic reaction to gemcitabine, other chemotherapy, other medicines, foods, dyes, or preservatives  pregnant or trying to get pregnant  breast-feeding How should I use this medicine? This drug is given as an infusion into a vein. It is administered in a hospital or clinic by a specially trained health care professional. Talk to your pediatrician regarding the use of this medicine in children. Special care may be needed. Overdosage: If you think you have taken too much of this medicine contact a poison control center or emergency room at once. NOTE: This medicine is only for you. Do not share this medicine with others. What if I miss a dose? It is important not to miss your dose. Call your doctor or health care professional if you are unable to keep an appointment. What may interact with this medicine?  medicines to increase blood counts like filgrastim, pegfilgrastim, sargramostim  some other chemotherapy drugs like cisplatin  vaccines Talk to your doctor or health care professional before taking any of these medicines:  acetaminophen  aspirin  ibuprofen  ketoprofen  naproxen This list may not describe all possible interactions. Give your health care provider a list of all the medicines, herbs, non-prescription drugs, or dietary supplements you use. Also tell them if you smoke, drink alcohol, or use illegal drugs. Some items may interact with your medicine. What should I watch for while using this medicine? Visit your doctor for checks on your progress. This drug may make you feel generally unwell. This is not uncommon, as chemotherapy can affect healthy cells as well as cancer cells. Report any side effects. Continue your course of treatment even though you feel ill unless your doctor tells you to stop. In some cases, you may be given additional medicines to help with side effects. Follow all directions  for their use. Call your doctor or health care professional for advice if you get a fever, chills or sore throat, or other symptoms of a cold or flu. Do not treat yourself. This drug decreases your body's ability to fight infections. Try to avoid being around people who are sick. This medicine may increase your risk to bruise or bleed. Call your doctor or health care professional if you notice any unusual bleeding. Be careful brushing and flossing your teeth or using a toothpick because you may get an infection or bleed more easily. If you have any dental work done, tell your dentist you are receiving this medicine. Avoid taking products that contain aspirin, acetaminophen, ibuprofen, naproxen, or ketoprofen unless instructed by your doctor. These medicines may hide a fever. Do not become pregnant while taking this medicine or for 6 months after stopping it. Women should inform their doctor if they wish to become pregnant or think they might be pregnant. Men should not father a child while taking this medicine and for 3 months after stopping it. There is a potential for serious side effects to an unborn child. Talk to your health care professional or pharmacist for more information. Do not breast-feed an infant while taking this medicine or for at least 1  week after stopping it. Men should inform their doctors if they wish to father a child. This medicine may lower sperm counts. Talk with your doctor or health care professional if you are concerned about your fertility. What side effects may I notice from receiving this medicine? Side effects that you should report to your doctor or health care professional as soon as possible:  allergic reactions like skin rash, itching or hives, swelling of the face, lips, or tongue  breathing problems  pain, redness, or irritation at site where injected  signs and symptoms of a dangerous change in heartbeat or heart rhythm like chest pain; dizziness; fast or  irregular heartbeat; palpitations; feeling faint or lightheaded, falls; breathing problems  signs of decreased platelets or bleeding - bruising, pinpoint red spots on the skin, black, tarry stools, blood in the urine  signs of decreased red blood cells - unusually weak or tired, feeling faint or lightheaded, falls  signs of infection - fever or chills, cough, sore throat, pain or difficulty passing urine  signs and symptoms of kidney injury like trouble passing urine or change in the amount of urine  signs and symptoms of liver injury like dark yellow or brown urine; general ill feeling or flu-like symptoms; light-colored stools; loss of appetite; nausea; right upper belly pain; unusually weak or tired; yellowing of the eyes or skin  swelling of ankles, feet, hands Side effects that usually do not require medical attention (report to your doctor or health care professional if they continue or are bothersome):  constipation  diarrhea  hair loss  loss of appetite  nausea  rash  vomiting This list may not describe all possible side effects. Call your doctor for medical advice about side effects. You may report side effects to FDA at 1-800-FDA-1088. Where should I keep my medicine? This drug is given in a hospital or clinic and will not be stored at home. NOTE: This sheet is a summary. It may not cover all possible information. If you have questions about this medicine, talk to your doctor, pharmacist, or health care provider.  2021 Elsevier/Gold Standard (2017-09-23 18:06:11)

## 2020-11-21 NOTE — Progress Notes (Signed)
Okay to proceed with treatment using 10/29/2020 CBC per Dr. Benay Spice.

## 2020-11-22 ENCOUNTER — Encounter: Payer: Self-pay | Admitting: General Practice

## 2020-11-22 ENCOUNTER — Inpatient Hospital Stay: Payer: Medicare Other

## 2020-11-22 ENCOUNTER — Inpatient Hospital Stay (HOSPITAL_BASED_OUTPATIENT_CLINIC_OR_DEPARTMENT_OTHER): Payer: Medicare Other | Admitting: Genetic Counselor

## 2020-11-22 ENCOUNTER — Telehealth: Payer: Self-pay | Admitting: *Deleted

## 2020-11-22 DIAGNOSIS — Z800 Family history of malignant neoplasm of digestive organs: Secondary | ICD-10-CM | POA: Diagnosis not present

## 2020-11-22 DIAGNOSIS — C252 Malignant neoplasm of tail of pancreas: Secondary | ICD-10-CM | POA: Diagnosis not present

## 2020-11-22 DIAGNOSIS — Z803 Family history of malignant neoplasm of breast: Secondary | ICD-10-CM | POA: Diagnosis not present

## 2020-11-22 LAB — CANCER ANTIGEN 19-9: CA 19-9: 13 U/mL (ref 0–35)

## 2020-11-22 NOTE — Progress Notes (Signed)
[facility] CSW Progress Notes  Call to patient at request of Verne Spurr.  No answer, left VM w my contact information and encouragement to return call at her convenience.  Edwyna Shell, LCSW Clinical Social Worker Phone:  [phone]

## 2020-11-22 NOTE — Telephone Encounter (Signed)
Patient is doing good post 1st chemotherapy tx of Xeloda and Gemzar. Patient's only voiced concern was that her teeth are getting a little sore. I told her to keep [place] up-to-date on that as well as any other symptoms that she may experience. She said that she will. I reminded her that if she developed mouth sores of diarrhea to stop the Xeloda and to call us. She verbalized understanding.

## 2020-11-23 ENCOUNTER — Telehealth: Payer: Self-pay | Admitting: General Practice

## 2020-11-23 NOTE — Telephone Encounter (Signed)
[facility] CSW Progress Notes  Called patient at request of Verne Spurr - wants help w managing anxiety re cancer diagnosis and treatment.  Patient reoprts that overall she realizes she has had few effects from treatment.  However, being reminded that she has cancer through having to take medications twice/day and feeling her port has been difficult.  She is frustrated by having to take additional medications for cancer treatment "I used to only have to take a couple of medicines and I like it that way."  Has trouble remembering to take her home chemo medications.   "Now I have to take 3 pills in the mornings and 3 pills in the evening - that's something else thrown in."  Stresses about the timing of taking the oral chemo pills.  Worries that her infusions will be lengthy - aware that she is slotted for a 2 hour infusion.  Overall, she has suffered from isolation during [place].  Her normal patterns of social interaction have been interrupted and she has become increasingly isolated.  She is fearful about going out to church/shopping/similar.    Discussed ways to find small ways to increase activity.  Daughter and husband are willing to take her places.  She would like to attend church again.  She spends her time reading, encouraged her to try getting outside.  She has observed that she has had few side effects from oral chemo and that her infusions have been short and tolerable.  She is aware that she tends to catastrophize - worrying about the worst possibilities vs enjoying the current moment.  Discussed ways to stay present and focus on what she can control today.   Edwyna Shell, LCSW Clinical Social Worker Phone:  [phone]

## 2020-11-26 ENCOUNTER — Encounter: Payer: Self-pay | Admitting: Genetic Counselor

## 2020-11-26 DIAGNOSIS — Z803 Family history of malignant neoplasm of breast: Secondary | ICD-10-CM

## 2020-11-26 HISTORY — DX: Family history of malignant neoplasm of breast: Z80.3

## 2020-11-26 NOTE — Progress Notes (Signed)
REFERRING PROVIDER: Ladell Pier, MD [facility],   [address]  PRIMARY PROVIDER:  Plotnikov, Evie Lacks, MD  PRIMARY REASON FOR VISIT:  1. 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 Family history of breast cancer    HISTORY OF PRESENT ILLNESS:   Ms. Sidman, a 75 y.o. female, was seen for a [facility] cancer genetics consultation at the request of Dr. Benay Spice due to a personal history of cancer.  Ms. Bowron presents to clinic today with her daughter, Levada Dy, to discuss the possibility of a hereditary predisposition to cancer, to discuss genetic testing, and to further clarify her future cancer risks, as well as potential cancer risks for family members.   In 2022, at the age of 43, Ms. Siedlecki was diagnosed with pancreatic cancer.  The treatment plan includes laparoscopic distal pancreatectomy (April 2022) and chemotherapy.   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[facility])   08/28/2020 Cancer Staging   Staging form: Exocrine Pancreas, AJCC 8th Edition - Clinical: Stage IB (cT2, cN0, cM0) - Signed by Ladell Pier, MD on 08/28/2020 Total positive nodes: 0   11/08/2020 Cancer Staging   Staging form: Exocrine Pancreas, AJCC 8th Edition - Pathologic: Stage IIB (pT2, pN1, cM0) - Signed by Ladell Pier, MD on 11/08/2020 Histologic grade (G): G1 Histologic grading system: 3 grade system Residual tumor (R): R0 - None Perineural invasion (PNI): Present   11/21/2020 -  Chemotherapy    Patient is on Treatment Plan: PANCREAS GEMCITABINE D1,8,15 + CAPECITABINE D1-21 Q28D X 6 CYCLES        RISK FACTORS:  Menarche was at age 71.  First live birth at age 48.  OCP use for approximately 0 years.  Ovaries intact: yes.  Hysterectomy: no.  Menopausal status: postmenopausal.  HRT use: 0 years. Mammogram within the last year: no. Number of breast biopsies: 0. History of pancreatitis:  none reported  Past Medical History:  Diagnosis Date  . Abdominal pain, epigastric 12/18/2009  . ANXIETY 05/25/2006  . BRONCHITIS, ACUTE 12/08/2007  . Complication of anesthesia     difficulty waking up x 1 - "years ago"  . Constipation   . COPD 05/25/2006  . DEPRESSION 01/02/2006  . Diabetes mellitus without complication ([facility])    type 2 - diet controlled - no meds  . ESOTROPIA, LEFT EYE 1945/08/12  . Family history of breast cancer 11/26/2020  . FIBROMYALGIA 04/02/2006  . GERD (gastroesophageal reflux disease)   . Headache(784.0) 12/08/2007  . History of blood transfusion   . HYPERLIPIDEMIA 04/30/2006  . HYPERTENSION 05/25/2006  . Hypertension   . INSOMNIA, PERSISTENT 02/22/2008  . Lazy eye    left - no surgery  . LOW BACK PAIN 01/27/2007  . Migraines   . 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 Neuropathy   . OBSTRUCTIVE SLEEP APNEA 04/17/2010   does not use CPAP  . OSTEOARTHRITIS 05/25/2006  . pancreatic ca dx'd 06/2020  . Polyuria   . Pre-diabetes    diet controlled  . Psychosomatic disease 2011  . 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 T M J 05/11/2009    Past Surgical History:  Procedure Laterality Date  . BIOPSY  08/16/2020   Procedure: BIOPSY;  Surgeon: Rush Landmark Telford Nab., MD;  Location: [facility];  Service: Endoscopy;;  . CHOLECYSTECTOMY    . COLONOSCOPY    . ESOPHAGOGASTRODUODENOSCOPY (EGD) WITH PROPOFOL N/A 08/16/2020   Procedure: ESOPHAGOGASTRODUODENOSCOPY (EGD)  WITH PROPOFOL;  Surgeon: Mansouraty, Telford Nab., MD;  Location: [facility];  Service: Endoscopy;  Laterality: N/A;  . EUS N/A 08/16/2020   Procedure: UPPER ENDOSCOPIC ULTRASOUND (EUS) RADIAL;  Surgeon: Rush Landmark Telford Nab., MD;  Location: [facility];  Service: Endoscopy;  Laterality: N/A;  . EYE SURGERY Bilateral    cataract with lens implant  . FINE NEEDLE ASPIRATION  08/16/2020   Procedure: FINE NEEDLE ASPIRATION (FNA) LINEAR;  Surgeon: Irving Copas., MD;  Location: [facility];  Service:  Endoscopy;;  . MASS EXCISION Left 10/06/2016   Procedure: EXCISION LEFT BUTTOCKS MASS;  Surgeon: Alphonsa Overall, MD;  Location: [facility];  Service: General;  Laterality: Left;  . PORTACATH PLACEMENT N/A 11/14/2020   Procedure: INSERTION PORT-A-CATH;  Surgeon: [name], MD;  Location: [facility];  Service: General;  Laterality: N/A;  . XI ROBOTIC ASSISTED LAPAROSCOPIC DISTAL PANCREATECTOMY N/A 10/17/2020   Procedure: XI ROBOTIC ASSISTED DISTAL PANCREATECTOMY AND SPLENECTOMY;  Surgeon: [name], MD;  Location: [facility];  Service: General;  Laterality: N/A;    Social History   Socioeconomic History  . Marital status: Married    Spouse name: Not on file  . Number of children: 5  . Years of education: Not on file  . Highest education level: Not on file  Occupational History  . Occupation: house wife    Employer: UNEMPLOYED  Tobacco Use  . Smoking status: Former Smoker    Types: Cigarettes    Quit date: 07/10/2013    Years since quitting: 7.3  . Smokeless tobacco: Current User    Types: Snuff  Vaping Use  . Vaping Use: Never used  Substance and Sexual Activity  . Alcohol use: No    Alcohol/week: 0.0 standard drinks  . Drug use: No  . Sexual activity: Not Currently    Birth control/protection: Post-menopausal  Other Topics Concern  . Not on file  Social History Narrative  . Not on file   Social Determinants of Health   Financial Resource Strain: Not on file  Food Insecurity: Not on file  Transportation Needs: Not on file  Physical Activity: Not on file  Stress: Not on file  Social Connections: Not on file     FAMILY HISTORY:  We obtained a detailed, 4-generation family history.  Significant diagnoses are listed below: Family History  Problem Relation Age of Onset  . Cervical cancer Mother        dx unknown age  . Skin cancer Mother        dx unknown age  . Breast cancer Maternal Aunt        dx after 50  . Cancer Maternal Uncle        ? colon; dx after  23  . Leukemia Brother        d. >50  . Skin cancer Daughter        basal cell carcinoma    Ms. Qazi is unaware of previous family history of genetic testing for hereditary cancer risks. There is no reported Ashkenazi Jewish ancestry. There is no known consanguinity.  GENETIC COUNSELING ASSESSMENT: Ms. Colasurdo is a 75 y.o. female with a personal history of cancer which is somewhat suggestive of a hereditary cancer syndrome and predisposition to cancer given diagnosis of pancreatic cancer. We, therefore, discussed and recommended the following at today's visit.   DISCUSSION: We discussed that 5 - 10% of cancer is hereditary, with most cases of hereditary pancreatic cancer associated with mutations in BRCA1/2.  There  are other genes that can be associated with hereditary pancreatic cancer syndromes.  These include but are not limited to CDKN2A and PALB2.  We discussed that testing is beneficial for several reasons including knowing how to follow individuals after completing their treatment, identifying whether potential treatment options, such as PARP inhibitors, would be beneficial, and understanding if other family members could be at risk for cancer and allowing them to undergo genetic testing.   We reviewed the characteristics, features and inheritance patterns of hereditary cancer syndromes. We also discussed genetic testing, including the appropriate family members to test, the process of testing, insurance coverage and turn-around-time for results. We discussed the implications of a negative, positive, carrier and/or variant of uncertain significant result. We recommended Ms. Fluellen pursue genetic testing for a panel that includes genes associated with pancreatic cancer.   The Multi-Cancer Panel with pancreatitis genes and preliminary pancreatic cancer genes offered by [facility] includes sequencing and/or deletion duplication testing of the following 91 genes: AIP, ALK, APC, ATM, AXIN2,BAP1,   BARD1, BLM, BMPR1A, BRCA1, BRCA2, BRIP1, CASR, CDC73, CDH1, CDK4, CDKN1B, CDKN1C, CDKN2A (p14ARF), CDKN2A (p16INK4a), CEBPA, CFTR, CHEK2, CPA1, CTNNA1, CTRC, DICER1, DIS3L2, EGFR (c.2369C>T, p.Thr790Met variant only), EPCAM (Deletion/duplication testing only), FANCC, FH, FLCN, GATA2, GPC3, GREM1 (Promoter region deletion/duplication testing only), HOXB13 (c.251G>A, p.Gly84Glu), HRAS, KIT, MAX, MEN1, MET, MITF (c.952G>A, p.Glu318Lys variant only), MLH1, MSH2, MSH3, MSH6, MUTYH, NBN, NF1, NF2, NTHL1, PALB2, PALLD, PDGFRA, PHOX2B, PMS2, POLD1, POLE, POT1, PRKAR1A, PRSS1, PTCH1, PTEN, RAD50, RAD51C, RAD51D, RB1, RECQL4, RET, RNF43, RUNX1, SDHAF2, SDHA (sequence changes only), SDHB, SDHC, SDHD, SMAD4, SMARCA4, SMARCB1, SMARCE1, SPINK1, STK11, SUFU, TERC, TERT, TMEM127, TP53, TSC1, TSC2, VHL, WRN and WT1.   Based on Ms. Dolph's personal history of pancreatic cancer, she meets medical criteria for genetic testing. Despite that she meets criteria, she may still have an out of pocket cost. We discussed that if she has an out of pocket cost for testing, the laboratory will contact her to discuss self-pay options and patient pay assistance programs.   PLAN: After considering the risks, benefits, and limitations, Ms. Stephanie provided informed consent to pursue genetic testing. Ms. Helzer prepared to provide a saliva sample over a blood sample.  The saliva was sent to [facility] for analysis of the Multi-Cancer Panel + Pancreatic Cancer Genes. Results should be available within approximately 3 weeks' time, at which point they will be disclosed by telephone to Ms. Iwata, as will any additional recommendations warranted by these results. Ms. Blanda will receive a summary of her genetic counseling visit and a copy of her results once available. This information will also be available in Epic.   Lastly, we encouraged Ms. Tierno to remain in contact with cancer genetics annually so that we can continuously update the  family history and inform her of any changes in cancer genetics and testing that may be of benefit for this family.   Ms. Macdowell questions were answered to her satisfaction today. Our contact information was provided should additional questions or concerns arise. Thank you for the referral and allowing [place] to share in the care of your patient.   Beatrix Breece M. Joette Catching, [facility] [place], [facility] Genetic Counselor [email or web address] (P) [phone]  The patient was seen for a total of 40 minutes in face-to-face genetic counseling.  Drs. Magrinat, Lindi Adie and/or Burr Medico were available to discuss this case as needed.    _______________________________________________________________________ For Office Staff:  Number of people involved in session: 1 Was an Intern/ student involved with case: no

## 2020-11-28 ENCOUNTER — Encounter (HOSPITAL_COMMUNITY): Payer: Self-pay | Admitting: General Surgery

## 2020-11-29 ENCOUNTER — Telehealth: Payer: Self-pay | Admitting: Oncology

## 2020-11-29 NOTE — Telephone Encounter (Signed)
Received an email from Ernestene Kiel to schedule a nutrition appt for this pt. No scheduling msgs were ever sent that I could find. I called pt and scheduled her nut appt per referral placed on 5/11. Pt is aware.

## 2020-11-30 ENCOUNTER — Other Ambulatory Visit: Payer: Self-pay

## 2020-11-30 ENCOUNTER — Inpatient Hospital Stay: Payer: Medicare Other | Admitting: Dietician

## 2020-11-30 NOTE — Progress Notes (Signed)
Nutrition Assessment   Reason for Assessment: MST   ASSESSMENT: 75 year old female with pancreatic cancer. She is s/p distal pancreatectomy and splenectomy on 4/6. Patient receiving adjuvant gemcitabine/capecitabine. She is followed by Dr. Benay Spice.   Met with patient and daughter in clinic. She reports tolerating regular diet s/p surgery, but unable to eat much. Patient reports she could eat a banana and not eat again until super. She has to have a "taste for foods" and is not often hungry. Pt recalls eating eggs, sausage, grits, hotdogs, bananas, apples, chicken noodle soup, chicken dumplings, drinks milk, cranberry juice, "some" water. Patient reports having a loose bowel movement yesterday, denies diarrhea, fatty stools. She has had some nausea, will take medication if needed. Patient reports trying supplements in the past, liked them but felt bloated after drinking. Patient reports she is unable to "sit still" and walks around the house, indoors and outdoors frequently.   Medications: Xanax, gabapentin, norco, prilosec, zofran, compazine   Labs: 5/11 Glucose 114  Anthropometrics: Patient is 35 lbs (17.5%) under usual body weight, which is significant.   Height: 5'2" Weight: 165 lb 3.2 oz UBW: 200 lbs (per pt) BMI: 30.2   NUTRITION DIAGNOSIS: Unintentional weight loss related to cancer and associated treatments as evidenced by 15% weight loss in 5 months which is significant.    INTERVENTION:  Discussed importance of adequate intake of calories and protein to maintain strength, weights, nutrition during treatment Encouraged eating small "mini" meals and snacks every 3 hours, handout with high calorie snack ideas given Discussed foods with protein, educated to have protein with every meal, handout provided Discussed ways to add calories and protein to meals, making every bite count, handout provided Encouraged baking soda/salt water mouth rinse several times day before meals,  recipe provided Discussed cutting supplement with milk or drinking half of supplement at a time for better tolerance  Encouraged walking after meals as able to aid with digestion Sample of vanilla Ensure and vanilla Boost provided Ensure coupons provided [employer] contact information provided   MONITORING, EVALUATION, GOAL: Patient will tolerate increased calories and protein to minimize weight loss during treatment.   Next Visit: via telephone Friday June 3

## 2020-12-02 ENCOUNTER — Other Ambulatory Visit: Payer: Self-pay | Admitting: Oncology

## 2020-12-04 ENCOUNTER — Encounter: Payer: Self-pay | Admitting: Genetic Counselor

## 2020-12-04 ENCOUNTER — Ambulatory Visit: Payer: Self-pay | Admitting: Genetic Counselor

## 2020-12-04 ENCOUNTER — Telehealth: Payer: Self-pay | Admitting: Genetic Counselor

## 2020-12-04 ENCOUNTER — Other Ambulatory Visit: Payer: Self-pay | Admitting: Oncology

## 2020-12-04 DIAGNOSIS — C252 Malignant neoplasm of tail of pancreas: Secondary | ICD-10-CM

## 2020-12-04 DIAGNOSIS — Z803 Family history of malignant neoplasm of breast: Secondary | ICD-10-CM

## 2020-12-04 DIAGNOSIS — Z1379 Encounter for other screening for genetic and chromosomal anomalies: Secondary | ICD-10-CM | POA: Insufficient documentation

## 2020-12-04 NOTE — Progress Notes (Signed)
HPI:  Amanda Arnold was previously seen in the [place] clinic due to a personal history of cancer and concerns regarding a hereditary predisposition to cancer. Please refer to our prior cancer genetics clinic note for more information regarding our discussion, assessment and recommendations, at the time. Amanda Arnold recent genetic test results were disclosed to her, as were recommendations warranted by these results. These results and recommendations are discussed in more detail below.  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[place])   08/28/2020 Cancer Staging   Staging form: Exocrine Pancreas, AJCC 8th Edition - Clinical: Stage IB (cT2, cN0, cM0) - Signed by Ladell Pier, MD on 08/28/2020 Total positive nodes: 0   11/08/2020 Cancer Staging   Staging form: Exocrine Pancreas, AJCC 8th Edition - Pathologic: Stage IIB (pT2, pN1, cM0) - Signed by Ladell Pier, MD on 11/08/2020 Histologic grade (G): G1 Histologic grading system: 3 grade system Residual tumor (R): R0 - None Perineural invasion (PNI): Present   11/21/2020 -  Chemotherapy    Patient is on Treatment Plan: PANCREAS GEMCITABINE D1,8,15 + CAPECITABINE D1-21 Q28D X 6 CYCLES      12/04/2020 Genetic Testing   Negative hereditary cancer genetic testing: no pathogenic variants detetected in [facility] Multi-Cancer Panel w/ pancreatitis genes and preliminary evidence pancreatic cancer genes.  Variants of uncertain significance detected in CEBPA at c.724G>A (p.Gly242Ser) and PALLD at c.115G>A (p.Gly39Ser).  The report date is Dec 04, 2020.   The Multi-Cancer Panel with pancreatitis genes and preliminary pancreatic cancer genes offered by [facility] includes sequencing and/or deletion duplication testing of the following 91 genes: AIP, ALK, APC, ATM, AXIN2,BAP1,  BARD1, BLM, BMPR1A, BRCA1, BRCA2, BRIP1, CASR, CDC73, CDH1, CDK4, CDKN1B, CDKN1C,  CDKN2A (p14ARF), CDKN2A (p16INK4a), CEBPA, CFTR, CHEK2, CPA1, CTNNA1, CTRC, DICER1, DIS3L2, EGFR (c.2369C>T, p.Thr790Met variant only), EPCAM (Deletion/duplication testing only), FANCC, FH, FLCN, GATA2, GPC3, GREM1 (Promoter region deletion/duplication testing only), HOXB13 (c.251G>A, p.Gly84Glu), HRAS, KIT, MAX, MEN1, MET, MITF (c.952G>A, p.Glu318Lys variant only), MLH1, MSH2, MSH3, MSH6, MUTYH, NBN, NF1, NF2, NTHL1, PALB2, PALLD, PDGFRA, PHOX2B, PMS2, POLD1, POLE, POT1, PRKAR1A, PRSS1, PTCH1, PTEN, RAD50, RAD51C, RAD51D, RB1, RECQL4, RET, RNF43, RUNX1, SDHAF2, SDHA (sequence changes only), SDHB, SDHC, SDHD, SMAD4, SMARCA4, SMARCB1, SMARCE1, SPINK1, STK11, SUFU, TERC, TERT, TMEM127, TP53, TSC1, TSC2, VHL, WRN and WT1.      FAMILY HISTORY:  We obtained a detailed, 4-generation family history.  Significant diagnoses are listed below: Family History  Problem Relation Age of Onset  . Cervical cancer Mother        dx unknown age  . Skin cancer Mother        dx unknown age  . Breast cancer Maternal Aunt        dx after 50  . Cancer Maternal Uncle        ? colon; dx after 29  . Leukemia Brother        d. >50  . Skin cancer Daughter        basal cell carcinoma    Amanda Arnold is unaware of previous family history of genetic testing for hereditary cancer risks. There is no reported Ashkenazi Jewish ancestry. There is no known consanguinity.   GENETIC TEST RESULTS: Genetic testing reported out on Dec 04, 2020.  The [facility] Multi-Cancer Panel found no pathogenic mutations. The Multi-Cancer Panel with pancreatitis genes and preliminary pancreatic cancer genes offered by [facility] includes sequencing and/or deletion duplication  testing of the following 91 genes: AIP, ALK, APC, ATM, AXIN2,BAP1,  BARD1, BLM, BMPR1A, BRCA1, BRCA2, BRIP1, CASR, CDC73, CDH1, CDK4, CDKN1B, CDKN1C, CDKN2A (p14ARF), CDKN2A (p16INK4a), CEBPA, CFTR, CHEK2, CPA1, CTNNA1, CTRC, DICER1, DIS3L2, EGFR (c.2369C>T, p.Thr790Met variant  only), EPCAM (Deletion/duplication testing only), FANCC, FH, FLCN, GATA2, GPC3, GREM1 (Promoter region deletion/duplication testing only), HOXB13 (c.251G>A, p.Gly84Glu), HRAS, KIT, MAX, MEN1, MET, MITF (c.952G>A, p.Glu318Lys variant only), MLH1, MSH2, MSH3, MSH6, MUTYH, NBN, NF1, NF2, NTHL1, PALB2, PALLD, PDGFRA, PHOX2B, PMS2, POLD1, POLE, POT1, PRKAR1A, PRSS1, PTCH1, PTEN, RAD50, RAD51C, RAD51D, RB1, RECQL4, RET, RNF43, RUNX1, SDHAF2, SDHA (sequence changes only), SDHB, SDHC, SDHD, SMAD4, SMARCA4, SMARCB1, SMARCE1, SPINK1, STK11, SUFU, TERC, TERT, TMEM127, TP53, TSC1, TSC2, VHL, WRN and WT1.   The test report has been scanned into EPIC and is located under the Molecular Pathology section of the Results Review tab.  A portion of the result report is included below for reference.     We discussed with Amanda Arnold that because current genetic testing is not perfect, it is possible there may be a gene mutation in one of these genes that current testing cannot detect, but that chance is small.  We also discussed, that there could be another gene that has not yet been discovered, or that we have not yet tested, that is responsible for the cancer diagnoses in the family. It is also possible there is a hereditary cause for the cancer in the family that Amanda Arnold did not inherit and therefore was not identified in her testing.  Therefore, it is important to remain in touch with cancer genetics in the future so that we can continue to offer Amanda Arnold the most up to date genetic testing.   Genetic testing did identify two variants of uncertain significance (VUS) - one in the CEBPA gene called c.724G>A (p.Gly242Ser) and a second in the PALLD gene called c.115G>A (p.Gly39Ser).  At this time, it is unknown if these variants are associated with increased cancer risk or if they are normal findings, but most variants such as these get reclassified to being inconsequential. They should not be used to make medical management  decisions. With time, we suspect the lab will determine the significance of these variants, if any. If we do learn more about them, we will try to contact Amanda Arnold to discuss it further. However, it is important to stay in touch with [place] periodically and keep the address and phone number up to date.  ADDITIONAL GENETIC TESTING: We discussed with Amanda Arnold that her genetic testing was fairly extensive.  If there are genes identified to increase cancer risk that can be analyzed in the future, we would be happy to discuss and coordinate this testing at that time.    CANCER SCREENING RECOMMENDATIONS: Amanda Arnold's test result is considered negative (normal).  This means that we have not identified a hereditary cause for her personal history of cancer at this time. Most cancers happen by chance and this negative test suggests that her cancer may fall into this category.    While reassuring, this does not definitively rule out a hereditary predisposition to cancer. It is still possible that there could be genetic mutations that are undetectable by current technology. There could be genetic mutations in genes that have not been tested or identified to increase cancer risk.  Therefore, it is recommended she continue to follow the cancer management and screening guidelines provided by her oncology and primary healthcare provider.   An individual's cancer risk and  medical management are not determined by genetic test results alone. Overall cancer risk assessment incorporates additional factors, including personal medical history, family history, and any available genetic information that may result in a personalized plan for cancer prevention and surveillance.   RECOMMENDATIONS FOR FAMILY MEMBERS:  Individuals in this family might be at some increased risk of developing cancer, over the general population risk, simply due to the family history of cancer.  We recommended women in this family have a yearly mammogram  beginning at age 21, or 50 years younger than the earliest onset of cancer, an annual clinical breast exam, and perform monthly breast self-exams. Women in this family should also have a gynecological exam as recommended by their primary provider. Family members should be referred for colonoscopy starting at age 27.  FOLLOW-UP: Lastly, we discussed with Amanda Arnold that cancer genetics is a rapidly advancing field and it is possible that new genetic tests will be appropriate for her and/or her family members in the future. We encouraged her to remain in contact with cancer genetics on an annual basis so we can update her personal and family histories and let her know of advances in cancer genetics that may benefit this family.   Our contact number was provided. Amanda Arnold questions were answered to her satisfaction, and she knows she is welcome to call us at anytime with additional questions or concerns.     Audrina Marten M. Joette Catching, [place], [facility] Genetic Counselor [email or web address] (P) [phone]

## 2020-12-04 NOTE — Telephone Encounter (Signed)
Revealed negative genetic testing and variants of uncertain significance in PALLD and CEBPA.  Discussed that we do not know why she has pancreatic cancer or why there is cancer in the family. It could be sporadic, due to a different gene that we are not testing, or maybe our current technology may not be able to pick something up.  It will be important for her to keep in contact with genetics to keep up with whether additional testing may be needed.

## 2020-12-05 ENCOUNTER — Inpatient Hospital Stay: Payer: Medicare Other

## 2020-12-05 ENCOUNTER — Inpatient Hospital Stay: Payer: Medicare Other | Admitting: Nurse Practitioner

## 2020-12-05 ENCOUNTER — Encounter: Payer: Self-pay | Admitting: Nurse Practitioner

## 2020-12-05 ENCOUNTER — Other Ambulatory Visit: Payer: Self-pay

## 2020-12-05 VITALS — BP 123/67 | HR 55 | Temp 98.6°F | Resp 17 | Ht 62.0 in | Wt 162.0 lb

## 2020-12-05 DIAGNOSIS — Z5111 Encounter for antineoplastic chemotherapy: Secondary | ICD-10-CM | POA: Diagnosis not present

## 2020-12-05 DIAGNOSIS — C252 Malignant neoplasm of tail of pancreas: Secondary | ICD-10-CM

## 2020-12-05 DIAGNOSIS — Z79899 Other long term (current) drug therapy: Secondary | ICD-10-CM | POA: Diagnosis not present

## 2020-12-05 DIAGNOSIS — J449 Chronic obstructive pulmonary disease, unspecified: Secondary | ICD-10-CM | POA: Diagnosis not present

## 2020-12-05 DIAGNOSIS — I10 Essential (primary) hypertension: Secondary | ICD-10-CM | POA: Diagnosis not present

## 2020-12-05 DIAGNOSIS — Z87891 Personal history of nicotine dependence: Secondary | ICD-10-CM | POA: Diagnosis not present

## 2020-12-05 LAB — CBC WITH DIFFERENTIAL (CANCER CENTER ONLY)
Abs Immature Granulocytes: 0.02 10*3/uL (ref 0.00–0.07)
Basophils Absolute: 0 10*3/uL (ref 0.0–0.1)
Basophils Relative: 0 %
Eosinophils Absolute: 0.1 10*3/uL (ref 0.0–0.5)
Eosinophils Relative: 1 %
HCT: 36.6 % (ref 36.0–46.0)
Hemoglobin: 11.7 g/dL — ABNORMAL LOW (ref 12.0–15.0)
Immature Granulocytes: 0 %
Lymphocytes Relative: 38 %
Lymphs Abs: 3 10*3/uL (ref 0.7–4.0)
MCH: 29.7 pg (ref 26.0–34.0)
MCHC: 32 g/dL (ref 30.0–36.0)
MCV: 92.9 fL (ref 80.0–100.0)
Monocytes Absolute: 0.7 10*3/uL (ref 0.1–1.0)
Monocytes Relative: 9 %
Neutro Abs: 4.1 10*3/uL (ref 1.7–7.7)
Neutrophils Relative %: 52 %
Platelet Count: 230 10*3/uL (ref 150–400)
RBC: 3.94 MIL/uL (ref 3.87–5.11)
RDW: 15.8 % — ABNORMAL HIGH (ref 11.5–15.5)
WBC Count: 8 10*3/uL (ref 4.0–10.5)
nRBC: 0 % (ref 0.0–0.2)

## 2020-12-05 LAB — CMP (CANCER CENTER ONLY)
ALT: 13 U/L (ref 0–44)
AST: 22 U/L (ref 15–41)
Albumin: 4 g/dL (ref 3.5–5.0)
Alkaline Phosphatase: 54 U/L (ref 38–126)
Anion gap: 8 (ref 5–15)
BUN: 13 mg/dL (ref 8–23)
CO2: 25 mmol/L (ref 22–32)
Calcium: 8.8 mg/dL — ABNORMAL LOW (ref 8.9–10.3)
Chloride: 104 mmol/L (ref 98–111)
Creatinine: 0.44 mg/dL (ref 0.44–1.00)
GFR, Estimated: >60 mL/min (ref >60)
Glucose, Bld: 116 mg/dL — ABNORMAL HIGH (ref 70–99)
Potassium: 3.5 mmol/L (ref 3.5–5.1)
Sodium: 137 mmol/L (ref 135–145)
Total Bilirubin: 0.6 mg/dL (ref 0.3–1.2)
Total Protein: 6.4 g/dL — ABNORMAL LOW (ref 6.5–8.1)

## 2020-12-05 MED ORDER — HEPARIN SOD (PORK) LOCK FLUSH 100 UNIT/ML IV SOLN
500.0000 [IU] | Freq: Once | INTRAVENOUS | Status: AC | PRN
  Administered 2020-12-05: 500 [IU]
  Filled 2020-12-05: qty 5

## 2020-12-05 MED ORDER — SODIUM CHLORIDE 0.9% FLUSH
10.0000 mL | INTRAVENOUS | Status: DC | PRN
  Administered 2020-12-05: 10 mL
  Filled 2020-12-05: qty 10

## 2020-12-05 MED ORDER — PROCHLORPERAZINE MALEATE 10 MG PO TABS
10.0000 mg | ORAL_TABLET | Freq: Once | ORAL | Status: AC
  Administered 2020-12-05: 10 mg via ORAL
  Filled 2020-12-05: qty 1

## 2020-12-05 MED ORDER — SODIUM CHLORIDE 0.9 % IV SOLN
1000.0000 mg/m2 | Freq: Once | INTRAVENOUS | Status: AC
  Administered 2020-12-05: 1824 mg via INTRAVENOUS
  Filled 2020-12-05: qty 47.97

## 2020-12-05 MED ORDER — SODIUM CHLORIDE 0.9 % IV SOLN
Freq: Once | INTRAVENOUS | Status: AC
  Filled 2020-12-05: qty 250

## 2020-12-05 NOTE — Patient Instructions (Signed)

## 2020-12-05 NOTE — Progress Notes (Signed)
  [place] OFFICE PROGRESS NOTE   Diagnosis: Pancreas cancer  INTERVAL HISTORY:   Amanda Arnold returns as scheduled.  Cycle 1 day 1 gemcitabine 11/21/2020.  She continues Xeloda.  She had a few episodes of dry heaves.  No vomiting.  Mild diarrhea.  No mouth sores.  Ears feel "stopped up".  No other upper respiratory symptoms.  She denies fever.  No rash.  She developed a pimple-like lesion on the right breast.  She applied warm compresses and Neosporin with improvement.  Objective:  Vital signs in last 24 hours:  Blood pressure 123/67, pulse (!) 55, temperature 98.6 F (37 C), temperature source Oral, resp. rate 17, height 5\' 2"  (1.575 m), weight 162 lb (73.5 kg), SpO2 99 %.    HEENT: No thrush or ulcers. Resp: Lungs clear bilaterally. Cardio: Regular rate and rhythm. GI: Abdomen soft and nontender.  No hepatosplenomegaly.  Healed surgical incision. Vascular: No leg edema. Skin: Healing skin lesion right breast. Port-A-Cath without erythema.   Lab Results:  Lab Results  Component Value Date   WBC 8.0 12/05/2020   HGB 11.7 (L) 12/05/2020   HCT 36.6 12/05/2020   MCV 92.9 12/05/2020   PLT 230 12/05/2020   NEUTROABS 4.1 12/05/2020    Imaging:  No results found.  Medications: I have reviewed the patient's current medications.  Assessment/Plan: 1. Pancreas cancer-stage IIb (pT2pN1) ? CT abdomen/pelvis 06/01/2020-2.9 x 3 cm pancreas tail mass, slight increase in size of 7 mm right middle lobe nodule ? MRI/MRCP 06/22/2020-solid 3.8 x 3 x 3.1 cm pancreas tail mass with focal occlusion of the splenic vein and dilated venous collaterals, no evidence of metastatic disease, bilateral adrenal adenomas ? CT renal stone study 08/02/2020-scattered airspace opacities throughout the lung bases-new favored to represent infectious/inflammatory changes ? EUS 08/16/2020-pancreas tail mass, T2N0, FNA biopsy-suspicious for malignancy ? CTs 08/27/2020-unchanged right middle lobe  nodule, superficial left upper back lesion, no change in pancreas tail mass, occluded splenic vein with collaterals at the splenic hilum, no enlarged abdominal pelvic lymph nodes ? 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  2/15 lymph nodes positive, positive lymphovascular and perineural invasion, resection margins negative, pT2pN1 ? Cycle 1 gemcitabine/capecitabine 11/21/2020, day 15 gemcitabine 12/05/2020 2.   Gastric ulcer on EUS 08/16/2020-H. pylori positive 3.   COVID-19 infection January 2022 4.   COPD 5.   Hypertension 6.   Right abdomen/right leg pain-musculoskeletal? 7.   Right middle lobe nodule on CT 06/01/2020, increased from 2017, stable on CT 11/13/2020 8.   History of tobacco use 9.   Superficial left upper back lesion noted on CT 08/27/2020  Disposition: Amanda Arnold appears stable.  She tolerated cycle 1 day 1 Gemcitabine well.  She continues Xeloda.  Plan to proceed with cycle 1 day 15 gemcitabine today as scheduled.  She will continue Xeloda for 21 days followed by a 7-day break.  We reviewed the CBC and chemistry panel from today.  Labs adequate to proceed with Gemcitabine.  She will return for lab, follow-up, cycle 2-day 1 Gemcitabine in 2 weeks.  She will contact the office in the interim with any problems.    Ned Card ANP/GNP-BC   12/05/2020  9:49 AM

## 2020-12-05 NOTE — Patient Instructions (Signed)
[facility]    Discharge Instructions:  Thank you for choosing [facility] to provide your oncology and hematology care.   If you have a lab appointment with the [facility], please go directly to the [facility] and check in at the registration area.   Wear comfortable clothing and clothing appropriate for easy access to any Portacath or PICC line.   We strive to give you quality time with your provider. You may need to reschedule your appointment if you arrive late (15 or more minutes).  Arriving late affects you and other patients whose appointments are after yours.  Also, if you miss three or more appointments without notifying the office, you may be dismissed from the clinic at the provider's discretion.      For prescription refill requests, have your pharmacy contact our office and allow 72 hours for refills to be completed.    Today you received the following chemotherapy and/or immunotherapy agents Gemcitabine   To help prevent nausea and vomiting after your treatment, we encourage you to take your nausea medication as directed.  BELOW ARE SYMPTOMS THAT SHOULD BE REPORTED IMMEDIATELY: . *FEVER GREATER THAN 100.4 F (38 C) OR HIGHER . *CHILLS OR SWEATING . *NAUSEA AND VOMITING THAT IS NOT CONTROLLED WITH YOUR NAUSEA MEDICATION . *UNUSUAL SHORTNESS OF BREATH . *UNUSUAL BRUISING OR BLEEDING . *URINARY PROBLEMS (pain or burning when urinating, or frequent urination) . *BOWEL PROBLEMS (unusual diarrhea, constipation, pain near the anus) . TENDERNESS IN MOUTH AND THROAT WITH OR WITHOUT PRESENCE OF ULCERS (sore throat, sores in mouth, or a toothache) . UNUSUAL RASH, SWELLING OR PAIN  . UNUSUAL VAGINAL DISCHARGE OR ITCHING   Items with * indicate a potential emergency and should be followed up as soon as possible or go to the Emergency Department if any problems should occur.  Please show the CHEMOTHERAPY ALERT CARD or IMMUNOTHERAPY ALERT  CARD at check-in to the Emergency Department and triage nurse.  Should you have questions after your visit or need to cancel or reschedule your appointment, please contact [facility]  Dept: [phone]  and follow the prompts.  Office hours are 8:00 a.m. to 4:30 p.m. Monday - Friday. Please note that voicemails left after 4:00 p.m. may not be returned until the following business day.  We are closed weekends and major holidays. You have access to a nurse at all times for urgent questions. Please call the main number to the clinic Dept: [phone] and follow the prompts.   For any non-urgent questions, you may also contact your provider using MyChart. We now offer e-Visits for anyone 56 and older to request care online for non-urgent symptoms. For details visit [email or web address].   Also download the MyChart app! Go to the app store, search "MyChart", open the app, select [facility], and log in with your MyChart username and password.  Due to Covid, a mask is required upon entering the hospital/clinic. If you do not have a mask, one will be given to you upon arrival. For doctor visits, patients may have 1 support person aged 24 or older with them. For treatment visits, patients cannot have anyone with them due to current Covid guidelines and our immunocompromised population.    Gemcitabine injection What is this medicine? GEMCITABINE (jem SYE ta been) is a chemotherapy drug. This medicine is used to treat many types of cancer like breast cancer, lung cancer, pancreatic cancer, and ovarian cancer. This medicine may  be used for other purposes; ask your health care provider or pharmacist if you have questions. COMMON BRAND NAME(S): Gemzar, Infugem What should I tell my health care provider before I take this medicine? They need to know if you have any of these conditions:  blood disorders  infection  kidney disease  liver disease  lung or breathing  disease, like asthma  recent or ongoing radiation therapy  an unusual or allergic reaction to gemcitabine, other chemotherapy, other medicines, foods, dyes, or preservatives  pregnant or trying to get pregnant  breast-feeding How should I use this medicine? This drug is given as an infusion into a vein. It is administered in a hospital or clinic by a specially trained health care professional. Talk to your pediatrician regarding the use of this medicine in children. Special care may be needed. Overdosage: If you think you have taken too much of this medicine contact a poison control center or emergency room at once. NOTE: This medicine is only for you. Do not share this medicine with others. What if I miss a dose? It is important not to miss your dose. Call your doctor or health care professional if you are unable to keep an appointment. What may interact with this medicine?  medicines to increase blood counts like filgrastim, pegfilgrastim, sargramostim  some other chemotherapy drugs like cisplatin  vaccines Talk to your doctor or health care professional before taking any of these medicines:  acetaminophen  aspirin  ibuprofen  ketoprofen  naproxen This list may not describe all possible interactions. Give your health care provider a list of all the medicines, herbs, non-prescription drugs, or dietary supplements you use. Also tell them if you smoke, drink alcohol, or use illegal drugs. Some items may interact with your medicine. What should I watch for while using this medicine? Visit your doctor for checks on your progress. This drug may make you feel generally unwell. This is not uncommon, as chemotherapy can affect healthy cells as well as cancer cells. Report any side effects. Continue your course of treatment even though you feel ill unless your doctor tells you to stop. In some cases, you may be given additional medicines to help with side effects. Follow all directions  for their use. Call your doctor or health care professional for advice if you get a fever, chills or sore throat, or other symptoms of a cold or flu. Do not treat yourself. This drug decreases your body's ability to fight infections. Try to avoid being around people who are sick. This medicine may increase your risk to bruise or bleed. Call your doctor or health care professional if you notice any unusual bleeding. Be careful brushing and flossing your teeth or using a toothpick because you may get an infection or bleed more easily. If you have any dental work done, tell your dentist you are receiving this medicine. Avoid taking products that contain aspirin, acetaminophen, ibuprofen, naproxen, or ketoprofen unless instructed by your doctor. These medicines may hide a fever. Do not become pregnant while taking this medicine or for 6 months after stopping it. Women should inform their doctor if they wish to become pregnant or think they might be pregnant. Men should not father a child while taking this medicine and for 3 months after stopping it. There is a potential for serious side effects to an unborn child. Talk to your health care professional or pharmacist for more information. Do not breast-feed an infant while taking this medicine or for at least 1  week after stopping it. Men should inform their doctors if they wish to father a child. This medicine may lower sperm counts. Talk with your doctor or health care professional if you are concerned about your fertility. What side effects may I notice from receiving this medicine? Side effects that you should report to your doctor or health care professional as soon as possible:  allergic reactions like skin rash, itching or hives, swelling of the face, lips, or tongue  breathing problems  pain, redness, or irritation at site where injected  signs and symptoms of a dangerous change in heartbeat or heart rhythm like chest pain; dizziness; fast or  irregular heartbeat; palpitations; feeling faint or lightheaded, falls; breathing problems  signs of decreased platelets or bleeding - bruising, pinpoint red spots on the skin, black, tarry stools, blood in the urine  signs of decreased red blood cells - unusually weak or tired, feeling faint or lightheaded, falls  signs of infection - fever or chills, cough, sore throat, pain or difficulty passing urine  signs and symptoms of kidney injury like trouble passing urine or change in the amount of urine  signs and symptoms of liver injury like dark yellow or brown urine; general ill feeling or flu-like symptoms; light-colored stools; loss of appetite; nausea; right upper belly pain; unusually weak or tired; yellowing of the eyes or skin  swelling of ankles, feet, hands Side effects that usually do not require medical attention (report to your doctor or health care professional if they continue or are bothersome):  constipation  diarrhea  hair loss  loss of appetite  nausea  rash  vomiting This list may not describe all possible side effects. Call your doctor for medical advice about side effects. You may report side effects to FDA at 1-800-FDA-1088. Where should I keep my medicine? This drug is given in a hospital or clinic and will not be stored at home. NOTE: This sheet is a summary. It may not cover all possible information. If you have questions about this medicine, talk to your doctor, pharmacist, or health care provider.  2021 Elsevier/Gold Standard (2017-09-23 18:06:11)

## 2020-12-07 ENCOUNTER — Encounter: Payer: Self-pay | Admitting: Internal Medicine

## 2020-12-07 ENCOUNTER — Ambulatory Visit (INDEPENDENT_AMBULATORY_CARE_PROVIDER_SITE_OTHER): Payer: Medicare Other | Admitting: Internal Medicine

## 2020-12-07 ENCOUNTER — Other Ambulatory Visit (HOSPITAL_COMMUNITY): Payer: Self-pay

## 2020-12-07 ENCOUNTER — Other Ambulatory Visit: Payer: Self-pay | Admitting: Oncology

## 2020-12-07 ENCOUNTER — Other Ambulatory Visit: Payer: Self-pay

## 2020-12-07 VITALS — BP 118/60 | HR 64 | Temp 98.6°F | Ht 62.0 in | Wt 163.0 lb

## 2020-12-07 DIAGNOSIS — E1129 Type 2 diabetes mellitus with other diabetic kidney complication: Secondary | ICD-10-CM

## 2020-12-07 DIAGNOSIS — F411 Generalized anxiety disorder: Secondary | ICD-10-CM

## 2020-12-07 DIAGNOSIS — H6123 Impacted cerumen, bilateral: Secondary | ICD-10-CM | POA: Diagnosis not present

## 2020-12-07 DIAGNOSIS — I10 Essential (primary) hypertension: Secondary | ICD-10-CM

## 2020-12-07 DIAGNOSIS — C252 Malignant neoplasm of tail of pancreas: Secondary | ICD-10-CM

## 2020-12-07 MED ORDER — CAPECITABINE 500 MG PO TABS
ORAL_TABLET | ORAL | 0 refills | Status: DC
  Filled 2020-12-07: qty 126, fill #0
  Filled 2020-12-11: qty 126, 28d supply, fill #0

## 2020-12-07 NOTE — Patient Instructions (Signed)
Your ears were cleared of wax today  Please continue all other medications as before, and refills have been done if requested.  Please have the pharmacy call with any other refills you may need.  Please continue your efforts at being more active, low cholesterol diet, and weight control..  Please keep your appointments with your specialists as you may have planned

## 2020-12-07 NOTE — Progress Notes (Signed)
Patient ID: Amanda Arnold, female   DOB: Dec 22, 1945, 75 y.o.   MRN: [id number]        Chief Complaint: cant hear from both ears       HPI:  Amanda Arnold is a 75 y.o. female here with bilateral wax impactions and reduced hearing       Wt Readings from Last 3 Encounters:  12/07/20 163 lb (73.9 kg)  12/05/20 162 lb (73.5 kg)  11/21/20 165 lb 3.2 oz (74.9 kg)   BP Readings from Last 3 Encounters:  12/07/20 118/60  12/05/20 123/67  11/21/20 135/65         Past Medical History:  Diagnosis Date  . Abdominal pain, epigastric 12/18/2009  . ANXIETY 05/25/2006  . BRONCHITIS, ACUTE 12/08/2007  . Complication of anesthesia     difficulty waking up x 1 - "years ago"  . Constipation   . COPD 05/25/2006  . DEPRESSION 01/02/2006  . Diabetes mellitus without complication ([facility])    type 2 - diet controlled - no meds  . ESOTROPIA, LEFT EYE 03-21-48  . Family history of breast cancer 11/26/2020  . FIBROMYALGIA 04/02/2006  . GERD (gastroesophageal reflux disease)   . Headache(784.0) 12/08/2007  . History of blood transfusion   . HYPERLIPIDEMIA 04/30/2006  . HYPERTENSION 05/25/2006  . Hypertension   . INSOMNIA, PERSISTENT 02/22/2008  . Lazy eye    left - no surgery  . LOW BACK PAIN 01/27/2007  . Migraines   . 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 Neuropathy   . OBSTRUCTIVE SLEEP APNEA 04/17/2010   does not use CPAP  . OSTEOARTHRITIS 05/25/2006  . pancreatic ca dx'd 06/2020  . Polyuria   . Pre-diabetes    diet controlled  . Psychosomatic disease 2011  . 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 T M J 05/11/2009   Past Surgical History:  Procedure Laterality Date  . BIOPSY  08/16/2020   Procedure: BIOPSY;  Surgeon: Rush Landmark Telford Nab., MD;  Location: [facility];  Service: Endoscopy;;  . CHOLECYSTECTOMY    . COLONOSCOPY    . ESOPHAGOGASTRODUODENOSCOPY (EGD) WITH PROPOFOL N/A 08/16/2020   Procedure: ESOPHAGOGASTRODUODENOSCOPY (EGD) WITH PROPOFOL;  Surgeon: Rush Landmark Telford Nab., MD;   Location: [facility];  Service: Endoscopy;  Laterality: N/A;  . EUS N/A 08/16/2020   Procedure: UPPER ENDOSCOPIC ULTRASOUND (EUS) RADIAL;  Surgeon: Rush Landmark Telford Nab., MD;  Location: [facility];  Service: Endoscopy;  Laterality: N/A;  . EYE SURGERY Bilateral    cataract with lens implant  . FINE NEEDLE ASPIRATION  08/16/2020   Procedure: FINE NEEDLE ASPIRATION (FNA) LINEAR;  Surgeon: Irving Copas., MD;  Location: [facility];  Service: Endoscopy;;  . MASS EXCISION Left 10/06/2016   Procedure: EXCISION LEFT BUTTOCKS MASS;  Surgeon: Alphonsa Overall, MD;  Location: [facility];  Service: General;  Laterality: Left;  . PORTACATH PLACEMENT N/A 11/14/2020   Procedure: INSERTION PORT-A-CATH;  Surgeon: [name], MD;  Location: [facility];  Service: General;  Laterality: N/A;  . 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 Her smoking use included cigarettes. Her smokeless tobacco use includes snuff. She reports that she does not drink alcohol and does not use drugs. family history includes Breast cancer in her maternal aunt; Cancer in her maternal uncle; Cervical cancer in her mother;  Heart attack in her father; Heart disease in her mother; Hypertension in her brother; Leukemia in her brother; Skin cancer in her daughter and mother. Allergies  Allergen Reactions  . Morphine And Related Shortness Of Breath  . Bupropion Hcl Other (See Comments)    "does not feel right"  . Ezetimibe Other (See Comments)    REACTION: legs burning  . Metformin And Related Diarrhea    Diarrhea w/XR or regular   . Naproxen     Upset stomach, diarrhea  . Trazodone Hcl Swelling  . Tramadol Hcl Palpitations    REACTION: palpitations   Current Outpatient Medications on  File Prior to Visit  Medication Sig Dispense Refill  . ALPRAZolam (XANAX) 0.5 MG tablet TAKE 1 TABLET(0.5 MG) BY MOUTH TWICE DAILY AS NEEDED FOR ANXIETY (Patient taking differently: Take 0.5 mg by mouth 2 (two) times daily as needed for anxiety.) 60 tablet 1  . gabapentin (NEURONTIN) 300 MG capsule Take 1 capsule (300 mg total) by mouth 3 (three) times daily. (Patient taking differently: Take 300 mg by mouth 3 (three) times daily as needed (pain).) 90 capsule 5  . HYDROcodone-acetaminophen (NORCO) 7.5-325 MG tablet Take 1 tablet by mouth every 8 (eight) hours as needed for severe pain. 40 tablet 0  . ibuprofen (ADVIL) 600 MG tablet Take 600 mg by mouth every 6 (six) hours as needed.    . metoprolol succinate (TOPROL-XL) 25 MG 24 hr tablet TAKE 1/2 TABLET(12.5 MG) BY MOUTH IN THE MORNING AND AT 1/2 TABLET EVERY EVENING 90 tablet 1  . rosuvastatin (CRESTOR) 10 MG tablet Take 1 tablet (10 mg total) by mouth daily. (Patient taking differently: Take 10 mg by mouth in the morning.) 90 tablet 3  . lidocaine-prilocaine (EMLA) cream Apply 1 application topically as directed. Apply to port site 2 hours prior to stick and cover with plastic wrap (Patient not taking: Reported on 12/07/2020) 30 g 5  . nystatin (MYCOSTATIN/NYSTOP) powder Apply 1 application topically 3 (three) times daily. (Patient not taking: Reported on 12/07/2020) 15 g 2  . ondansetron (ZOFRAN) 8 MG tablet Take 1 tablet (8 mg total) by mouth every 8 (eight) hours as needed for nausea or vomiting. Start 72 hours after IV chemo infusion (Patient not taking: No sig reported) 30 tablet 1  . prochlorperazine (COMPAZINE) 10 MG tablet Take 1 tablet (10 mg total) by mouth every 6 (six) hours as needed for nausea. (Patient not taking: No sig reported) 60 tablet 1   No current facility-administered medications on file prior to visit.        ROS:  All others reviewed and negative.  Objective        PE:  BP 118/60 (BP Location: Right Arm, Patient  Position: Sitting, Cuff Size: Large)   Pulse 64   Temp 98.6 F (37 C) (Oral)   Ht 5\' 2"  (1.575 m)   Wt 163 lb (73.9 kg)   LMP  (LMP Unknown)   SpO2 96%   BMI 29.81 kg/m                 Constitutional: Pt appears in NAD               HENT: Head: NCAT.                Right Ear: External ear normal.                 Left Ear: External ear normal.  bilat tms clear after wax impactions cleared with irrigation                Eyes: . Pupils are equal, round, and reactive to light. Conjunctivae and EOM are normal               Nose: without d/c or deformity               Neck: Neck supple. Gross normal ROM               Cardiovascular: Normal rate and regular rhythm.                 Pulmonary/Chest: Effort normal and breath sounds without rales or wheezing.                               Neurological: Pt is alert. At baseline orientation, motor grossly intact               Skin: Skin is warm. No rashes, no other new lesions, LE edema - none               Psychiatric: Pt behavior is normal without agitation   Micro: none  Cardiac tracings I have personally interpreted today:  none  Pertinent Radiological findings (summarize): none   Lab Results  Component Value Date   WBC 8.0 12/05/2020   HGB 11.7 (L) 12/05/2020   HCT 36.6 12/05/2020   PLT 230 12/05/2020   GLUCOSE 116 (H) 12/05/2020   CHOL 167 09/17/2017   TRIG 92.0 09/17/2017   HDL 53.00 09/17/2017   LDLCALC 96 09/17/2017   ALT 13 12/05/2020   AST 22 12/05/2020   NA 137 12/05/2020   K 3.5 12/05/2020   CL 104 12/05/2020   CREATININE 0.44 12/05/2020   BUN 13 12/05/2020   CO2 25 12/05/2020   TSH 1.24 02/16/2020   INR 1.1 (H) 08/22/2020   HGBA1C 5.9 (H) 10/17/2020   Assessment/Plan:  Amanda Arnold is a 75 y.o. White or Caucasian [1] female with  has a past medical history of Abdominal pain, epigastric (12/18/2009), ANXIETY (05/25/2006), BRONCHITIS, ACUTE (1/44/3154), Complication of anesthesia, Constipation,  COPD (05/25/2006), DEPRESSION (01/02/2006), Diabetes mellitus without complication ([place]), ESOTROPIA, LEFT EYE (September 18, 1945), Family history of breast cancer (11/26/2020), FIBROMYALGIA (04/02/2006), GERD (gastroesophageal reflux disease), Headache(784.0) (12/08/2007), History of blood transfusion, HYPERLIPIDEMIA (04/30/2006), HYPERTENSION (05/25/2006), Hypertension, INSOMNIA, PERSISTENT (02/22/2008), Lazy eye, LOW BACK PAIN (01/27/2007), Migraines, NEURALGIA, TRIGEMINAL (05/15/2009), Neuropathy, OBSTRUCTIVE SLEEP APNEA (04/17/2010), OSTEOARTHRITIS (05/25/2006), pancreatic ca (dx'd 06/2020), Polyuria, Pre-diabetes, Psychosomatic disease (2011), Somatization disorder (04/10/2010), and T M J (05/11/2009).  Cerumen impaction Resolved with irrigation  Followup: Return if symptoms worsen or fail to improve.  Cathlean Cower, MD 12/09/2020 7:54 PM [facility]

## 2020-12-09 ENCOUNTER — Encounter: Payer: Self-pay | Admitting: Internal Medicine

## 2020-12-09 NOTE — Assessment & Plan Note (Signed)
Resolved with irrigation. 

## 2020-12-11 ENCOUNTER — Other Ambulatory Visit (HOSPITAL_COMMUNITY): Payer: Self-pay

## 2020-12-12 ENCOUNTER — Other Ambulatory Visit: Payer: Self-pay

## 2020-12-12 ENCOUNTER — Ambulatory Visit (INDEPENDENT_AMBULATORY_CARE_PROVIDER_SITE_OTHER): Payer: Medicare Other

## 2020-12-12 ENCOUNTER — Other Ambulatory Visit: Payer: Self-pay | Admitting: Oncology

## 2020-12-12 ENCOUNTER — Other Ambulatory Visit (HOSPITAL_COMMUNITY): Payer: Self-pay

## 2020-12-12 VITALS — BP 122/60 | HR 64 | Temp 97.6°F | Resp 16 | Ht 62.0 in | Wt 162.2 lb

## 2020-12-12 DIAGNOSIS — Z0000 Encounter for general adult medical examination without abnormal findings: Secondary | ICD-10-CM

## 2020-12-12 NOTE — Progress Notes (Signed)
Subjective:   Amanda Arnold is a 75 y.o. female who presents for Medicare Annual (Subsequent) preventive examination.  Review of Systems    No ROS. Medicare Wellness Visit. Additional risk factors are reflected in social history. Cardiac Risk Factors include: advanced age (>53men, >4 women);diabetes mellitus;dyslipidemia;family history of premature cardiovascular disease;hypertension     Objective:    Today's Vitals   12/12/20 1502  BP: 122/60  Pulse: 64  Resp: 16  Temp: 97.6 F (36.4 C)  SpO2: 93%  Weight: 162 lb 3.2 oz (73.6 kg)  Height: 5\' 2"  (1.575 m)  PainSc: 0-No pain   Body mass index is 29.67 kg/m.  Advanced Directives 12/12/2020 11/21/2020 11/14/2020 11/08/2020 10/17/2020 08/28/2020 08/16/2020  Does Patient Have a Medical Advance Directive? No No No No No No No  Does patient want to make changes to medical advance directive? - - - - - - -  Would patient like information on creating a medical advance directive? No - Patient declined No - Patient declined No - Patient declined No - Patient declined No - Patient declined No - Patient declined No - Patient declined    Current Medications (verified) Outpatient Encounter Medications as of 12/12/2020  Medication Sig  . ALPRAZolam (XANAX) 0.5 MG tablet TAKE 1 TABLET(0.5 MG) BY MOUTH TWICE DAILY AS NEEDED FOR ANXIETY (Patient taking differently: Take 0.5 mg by mouth 2 (two) times daily as needed for anxiety.)  . [START ON 12/19/2020] capecitabine (XELODA) 500 MG tablet Take 3 tablets (1500 mg) by mouth in AM and 3 tablets (1500 mg) by mouth in PM (total dose 3000 mg daily). Take within 30 minutes after meals. Take for 21 days on, 7 days off, repeat every 28 days.  Marland Kitchen gabapentin (NEURONTIN) 300 MG capsule Take 1 capsule (300 mg total) by mouth 3 (three) times daily. (Patient taking differently: Take 300 mg by mouth 3 (three) times daily as needed (pain).)  . HYDROcodone-acetaminophen (NORCO) 7.5-325 MG tablet Take 1 tablet by mouth  every 8 (eight) hours as needed for severe pain.  Marland Kitchen ibuprofen (ADVIL) 600 MG tablet Take 600 mg by mouth every 6 (six) hours as needed.  . lidocaine-prilocaine (EMLA) cream Apply 1 application topically as directed. Apply to port site 2 hours prior to stick and cover with plastic wrap (Patient not taking: Reported on 12/07/2020)  . metoprolol succinate (TOPROL-XL) 25 MG 24 hr tablet TAKE 1/2 TABLET(12.5 MG) BY MOUTH IN THE MORNING AND AT 1/2 TABLET EVERY EVENING  . nystatin (MYCOSTATIN/NYSTOP) powder Apply 1 application topically 3 (three) times daily. (Patient not taking: Reported on 12/07/2020)  . ondansetron (ZOFRAN) 8 MG tablet Take 1 tablet (8 mg total) by mouth every 8 (eight) hours as needed for nausea or vomiting. Start 72 hours after IV chemo infusion (Patient not taking: No sig reported)  . prochlorperazine (COMPAZINE) 10 MG tablet Take 1 tablet (10 mg total) by mouth every 6 (six) hours as needed for nausea. (Patient not taking: No sig reported)  . rosuvastatin (CRESTOR) 10 MG tablet Take 1 tablet (10 mg total) by mouth daily. (Patient taking differently: Take 10 mg by mouth in the morning.)   No facility-administered encounter medications on file as of 12/12/2020.    Allergies (verified) Morphine and related, Bupropion hcl, Ezetimibe, Metformin and related, Naproxen, Trazodone hcl, and Tramadol hcl   History: Past Medical History:  Diagnosis Date  . Abdominal pain, epigastric 12/18/2009  . ANXIETY 05/25/2006  . BRONCHITIS, ACUTE 12/08/2007  . Complication of anesthesia  difficulty waking up x 1 - "years ago"  . Constipation   . COPD 05/25/2006  . DEPRESSION 01/02/2006  . Diabetes mellitus without complication ([place])    type 2 - diet controlled - no meds  . 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 Family history of breast cancer 11/26/2020  . FIBROMYALGIA 04/02/2006  . GERD (gastroesophageal reflux disease)   . Headache(784.0) 12/08/2007  . History of blood transfusion   . HYPERLIPIDEMIA  04/30/2006  . HYPERTENSION 05/25/2006  . Hypertension   . INSOMNIA, PERSISTENT 02/22/2008  . Lazy eye    left - no surgery  . LOW BACK PAIN 01/27/2007  . Migraines   . 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 Neuropathy   . OBSTRUCTIVE SLEEP APNEA 04/17/2010   does not use CPAP  . OSTEOARTHRITIS 05/25/2006  . pancreatic ca dx'd 06/2020  . Polyuria   . Pre-diabetes    diet controlled  . Psychosomatic disease 2011  . 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 T M J 05/11/2009   Past Surgical History:  Procedure Laterality Date  . BIOPSY  08/16/2020   Procedure: BIOPSY;  Surgeon: Rush Landmark Telford Nab., MD;  Location: [facility];  Service: Endoscopy;;  . CHOLECYSTECTOMY    . COLONOSCOPY    . ESOPHAGOGASTRODUODENOSCOPY (EGD) WITH PROPOFOL N/A 08/16/2020   Procedure: ESOPHAGOGASTRODUODENOSCOPY (EGD) WITH PROPOFOL;  Surgeon: Rush Landmark Telford Nab., MD;  Location: [place];  Service: Endoscopy;  Laterality: N/A;  . EUS N/A 08/16/2020   Procedure: UPPER ENDOSCOPIC ULTRASOUND (EUS) RADIAL;  Surgeon: Rush Landmark Telford Nab., MD;  Location: [place];  Service: Endoscopy;  Laterality: N/A;  . EYE SURGERY Bilateral    cataract with lens implant  . FINE NEEDLE ASPIRATION  08/16/2020   Procedure: FINE NEEDLE ASPIRATION (FNA) LINEAR;  Surgeon: Irving Copas., MD;  Location: [place];  Service: Endoscopy;;  . MASS EXCISION Left 10/06/2016   Procedure: EXCISION LEFT BUTTOCKS MASS;  Surgeon: Alphonsa Overall, MD;  Location: [place];  Service: General;  Laterality: Left;  . PORTACATH PLACEMENT N/A 11/14/2020   Procedure: INSERTION PORT-A-CATH;  Surgeon: [name], MD;  Location: [place];  Service: General;  Laterality: N/A;  . XI ROBOTIC ASSISTED LAPAROSCOPIC DISTAL PANCREATECTOMY N/A 10/17/2020   Procedure: XI ROBOTIC ASSISTED DISTAL PANCREATECTOMY AND SPLENECTOMY;  Surgeon: [name], MD;  [facility];  Service: General;  Laterality: N/A;   Family History  Problem Relation  Age of Onset  . Heart attack Father   . Hypertension Brother   . Heart disease Mother   . Cervical cancer Mother        dx unknown age  . Skin cancer Mother        dx unknown age  . Breast cancer Maternal Aunt        dx after 50  . Cancer Maternal Uncle        ? colon; dx after 110  . Leukemia Brother        d. >50  . Skin cancer Daughter        basal cell carcinoma  . Colon cancer Neg Hx   . Stomach cancer Neg Hx    Social History   Socioeconomic History  . Marital status: Married    Spouse name: Not on file  . Number of children: 5  . Years of education: Not on file  . Highest education level: Not on file  Occupational History  . Occupation: house wife    Employer: UNEMPLOYED  Tobacco Use  . Smoking status:  Former Smoker    Types: Cigarettes    Quit date: 07/10/2013    Years since quitting: 7.4  . Smokeless tobacco: Current User    Types: Snuff  Vaping Use  . Vaping Use: Never used  Substance and Sexual Activity  . Alcohol use: No    Alcohol/week: 0.0 standard drinks  . Drug use: No  . Sexual activity: Not Currently    Birth control/protection: Post-menopausal  Other Topics Concern  . Not on file  Social History Narrative  . Not on file   Social Determinants of Health   Financial Resource Strain: Low Risk   . Difficulty of Paying Living Expenses: Not hard at all  Food Insecurity: No Food Insecurity  . Worried About Charity fundraiser in the Last Year: Never true  . Ran Out of Food in the Last Year: Never true  Transportation Needs: No Transportation Needs  . Lack of Transportation (Medical): No  . Lack of Transportation (Non-Medical): No  Physical Activity: Sufficiently Active  . Days of Exercise per Week: 5 days  . Minutes of Exercise per Session: 30 min  Stress: No Stress Concern Present  . Feeling of Stress : Not at all  Social Connections: Socially Integrated  . Frequency of Communication with Friends and Family: More than three times a week  .  Frequency of Social Gatherings with Friends and Family: Twice a week  . Attends Religious Services: 1 to 4 times per year  . Active Member of Clubs or Organizations: No  . Attends Archivist Meetings: 1 to 4 times per year  . Marital Status: Married    Tobacco Counseling Ready to quit: Not Answered Counseling given: Not Answered   Clinical Intake:  Pre-visit preparation completed: Yes  Pain : No/denies pain Pain Score: 0-No pain     BMI - recorded: 29.67 Nutritional Status: BMI 25 -29 Overweight Nutritional Risks: Other (Comment) (Decreased appetite) Diabetes: Yes CBG done?: No Did pt. bring in CBG monitor from home?: No  How often do you need to have someone help you when you read instructions, pamphlets, or other written materials from your doctor or pharmacy?: 1 - Never What is the last grade level you completed in school?: 8th grade  Diabetic? yes  Interpreter Needed?: No  Information entered by :: [name], LPN   Activities of Daily Living In your present state of health, do you have any difficulty performing the following activities: 12/12/2020 11/14/2020  Hearing? N -  Vision? N -  Difficulty concentrating or making decisions? N -  Walking or climbing stairs? N -  Dressing or bathing? N -  Comment - -  Doing errands, shopping? N Y  Conservation officer, nature and eating ? N -  Using the Toilet? N -  In the past six months, have you accidently leaked urine? N -  Do you have problems with loss of bowel control? N -  Managing your Medications? N -  Housekeeping or managing your Housekeeping? N -  Some recent data might be hidden    Patient Care Team: Plotnikov, Evie Lacks, MD as PCP - General Jerline Pain, MD as PCP - Cardiology (Cardiology) Erline Levine, MD as Consulting Physician (Neurosurgery) Jerline Pain, MD as Consulting Physician (Cardiology) Ladell Pier, MD as Consulting Physician (Oncology) [name], MD as Consulting Physician  (General Surgery)  Indicate any recent Medical Services you may have received from other than [facility] providers in the past year (date may be approximate).  Assessment:   This is a routine wellness examination for Nancie.  Hearing/Vision screen No exam data present  Dietary issues and exercise activities discussed: Current Exercise Habits: The patient does not participate in regular exercise at present, Exercise limited by: respiratory conditions(s);cardiac condition(s);psychological condition(s)  Goals Addressed            This Visit's Progress   . Patient Stated       To get well and be cancer free!!!      Depression Screen PHQ 2/9 Scores 12/12/2020 11/01/2019 10/28/2018 12/21/2017 09/26/2016 07/03/2015 09/22/2014  PHQ - 2 Score 2 0 2 1 1  0 0  PHQ- 9 Score - - 7 - - - -    Fall Risk Fall Risk  12/07/2020 11/01/2019 10/28/2018 12/21/2017 09/26/2016  Falls in the past year? 0 1 0 No No  Number falls in past yr: 0 0 0 - -  Injury with Fall? 0 1 - - -  Comment - broke shoulder - - -  Risk for fall due to : - Other (Comment) - - -  Risk for fall due to: Comment - syncope episode at grocery store - - -  Follow up - Falls evaluation completed;Education provided;Falls prevention discussed - - -    FALL RISK PREVENTION PERTAINING TO THE HOME:  Any stairs in or around the home? No  If so, are there any without handrails? No  Home free of loose throw rugs in walkways, pet beds, electrical cords, etc? Yes  Adequate lighting in your home to reduce risk of falls? Yes   ASSISTIVE DEVICES UTILIZED TO PREVENT FALLS:  Life alert? No  Use of a cane, walker or w/c? No  Grab bars in the bathroom? No  Shower chair or bench in shower? yes Elevated toilet seat or a handicapped toilet? Yes   TIMED UP AND GO:  Was the test performed? No .  Length of time to ambulate 10 feet: 0 sec.   Gait steady and fast without use of assistive device  Cognitive Function: Normal cognitive status assessed  by direct observation by this Nurse Health Advisor. No abnormalities found.          Immunizations Immunization History  Administered Date(s) Administered  . HiB (PRP-OMP) 09/04/2020  . Meningococcal B, Unspecified 09/04/2020  . Pneumococcal Conjugate-13 06/27/2016  . Pneumococcal Polysaccharide-23 04/14/2005, 09/17/2017, 09/04/2020  . Tdap 04/28/2019    TDAP status: Up to date  Flu Vaccine status: Declined, Education has been provided regarding the importance of this vaccine but patient still declined. Advised may receive this vaccine at local pharmacy or Health Dept. Aware to provide a copy of the vaccination record if obtained from local pharmacy or Health Dept. Verbalized acceptance and understanding.  Pneumococcal vaccine status: Up to date  Covid-19 vaccine status: Declined, Education has been provided regarding the importance of this vaccine but patient still declined. Advised may receive this vaccine at local pharmacy or Health Dept.or vaccine clinic. Aware to provide a copy of the vaccination record if obtained from local pharmacy or Health Dept. Verbalized acceptance and understanding.  Qualifies for Shingles Vaccine? Yes   Zostavax completed No   Shingrix Completed?: No.    Education has been provided regarding the importance of this vaccine. Patient has been advised to call insurance company to determine out of pocket expense if they have not yet received this vaccine. Advised may also receive vaccine at local pharmacy or Health Dept. Verbalized acceptance and understanding.  Screening Tests Health Maintenance  Topic Date Due  . URINE MICROALBUMIN  Never done  . COVID-19 Vaccine (1) Never done  . Hepatitis C Screening  Never done  . Zoster Vaccines- Shingrix (1 of 2) Never done  . COLONOSCOPY (Pts 45-10yrs Insurance coverage will need to be confirmed)  08/29/2019  . MAMMOGRAM  09/19/2019  . FOOT EXAM  10/28/2019  . OPHTHALMOLOGY EXAM  10/22/2020  . INFLUENZA VACCINE   02/11/2021  . HEMOGLOBIN A1C  04/18/2021  . TETANUS/TDAP  04/27/2029  . DEXA SCAN  Completed  . PNA vac Low Risk Adult  Completed  . HPV VACCINES  Aged Out    Health Maintenance  Health Maintenance Due  Topic Date Due  . URINE MICROALBUMIN  Never done  . COVID-19 Vaccine (1) Never done  . Hepatitis C Screening  Never done  . Zoster Vaccines- Shingrix (1 of 2) Never done  . COLONOSCOPY (Pts 45-61yrs Insurance coverage will need to be confirmed)  08/29/2019  . MAMMOGRAM  09/19/2019  . FOOT EXAM  10/28/2019  . OPHTHALMOLOGY EXAM  10/22/2020    Colorectal cancer screening: Type of screening: Colonoscopy. Completed 08/28/2014. Repeat every 5 years  Mammogram status: Completed 09/18/2017. Repeat every year  Bone Density status: Completed 11/14/2016. Results reflect: Bone density results: OSTEOPENIA. Repeat every 2 years.  Lung Cancer Screening: (Low Dose CT Chest recommended if Age 56-80 years, 30 pack-year currently smoking OR have quit w/in 15years.) does qualify.   Lung Cancer Screening Referral: no  Additional Screening:  Hepatitis C Screening: does qualify; Completed no  Vision Screening: Recommended annual ophthalmology exams for early detection of glaucoma and other disorders of the eye. Is the patient up to date with their annual eye exam?  Yes  Who is the provider or what is the name of the office in which the patient attends annual eye exams? [facility]  If pt is not established with a provider, would they like to be referred to a provider to establish care? No .   Dental Screening: Recommended annual dental exams for proper oral hygiene  Community Resource Referral / Chronic Care Management: CRR required this visit?  No   CCM required this visit?  No      Plan:     I have personally reviewed and noted the following in the patient's chart:   . Medical and social history . Use of alcohol, tobacco or illicit drugs  . Current medications and  supplements including opioid prescriptions.  . Functional ability and status . Nutritional status . Physical activity . Advanced directives . List of other physicians . Hospitalizations, surgeries, and ER visits in previous 12 months . Vitals . Screenings to include cognitive, depression, and falls . Referrals and appointments  In addition, I have reviewed and discussed with patient certain preventive protocols, quality metrics, and best practice recommendations. A written personalized care plan for preventive services as well as general preventive health recommendations were provided to patient.     Sheral Flow, LPN   0/12/2374   Nurse Notes:  Medications reviewed with patient; yes opioid use noted. Patient is being treated for pancreatic cancer. Colonoscopy, Mammogram and Bone Density are overdue.

## 2020-12-12 NOTE — Patient Instructions (Signed)
Amanda Arnold , Thank you for taking time to come for your Medicare Wellness Visit. I appreciate your ongoing commitment to your health goals. Please review the following plan we discussed and let me know if I can assist you in the future.   Screening recommendations/referrals: Colonoscopy: 08/28/2014; due every 5 years Mammogram: 09/18/2017; due every 2 years Bone Density: 11/14/2016; due every 2 years Recommended yearly ophthalmology/optometry visit for glaucoma screening and checkup Recommended yearly dental visit for hygiene and checkup  Vaccinations: Influenza vaccine: declined Pneumococcal vaccine: 06/27/2016, 09/04/2020 Tdap vaccine: 04/28/2019; due every 10 years Shingles vaccine: declined Covid-19: declined  Advanced directives: Advance directive discussed with you today. Even though you declined this today please call our office should you change your mind and we can give you the proper paperwork for you to fill out.  Conditions/risks identified: Yes; Reviewed health maintenance screenings with patient today and relevant education, vaccines, and/or referrals were provided. Please continue to do your personal lifestyle choices by: daily care of teeth and gums, regular physical activity (goal should be 5 days a week for 30 minutes), eat a healthy diet, avoid tobacco and drug use, limiting any alcohol intake, taking a low-dose aspirin (if not allergic or have been advised by your provider otherwise) and taking vitamins and minerals as recommended by your provider. Continue doing brain stimulating activities (puzzles, reading, adult coloring books, staying active) to keep memory sharp. Continue to eat heart healthy diet (full of fruits, vegetables, whole grains, lean protein, water--limit salt, fat, and sugar intake) and increase physical activity as tolerated.  Next appointment: Please schedule your next Medicare Wellness Visit with your Nurse Health Advisor in 1 year by calling  [phone].   Preventive Care 33 Years and Older, Female Preventive care refers to lifestyle choices and visits with your health care provider that can promote health and wellness. What does preventive care include?  A yearly physical exam. This is also called an annual well check.  Dental exams once or twice a year.  Routine eye exams. Ask your health care provider how often you should have your eyes checked.  Personal lifestyle choices, including:  Daily care of your teeth and gums.  Regular physical activity.  Eating a healthy diet.  Avoiding tobacco and drug use.  Limiting alcohol use.  Practicing safe sex.  Taking low-dose aspirin every day.  Taking vitamin and mineral supplements as recommended by your health care provider. What happens during an annual well check? The services and screenings done by your health care provider during your annual well check will depend on your age, overall health, lifestyle risk factors, and family history of disease. Counseling  Your health care provider may ask you questions about your:  Alcohol use.  Tobacco use.  Drug use.  Emotional well-being.  Home and relationship well-being.  Sexual activity.  Eating habits.  History of falls.  Memory and ability to understand (cognition).  Work and work Statistician.  Reproductive health. Screening  You may have the following tests or measurements:  Height, weight, and BMI.  Blood pressure.  Lipid and cholesterol levels. These may be checked every 5 years, or more frequently if you are over 43 years old.  Skin check.  Lung cancer screening. You may have this screening every year starting at age 22 if you have a 30-pack-year history of smoking and currently smoke or have quit within the past 15 years.  Fecal occult blood test (FOBT) of the stool. You may have this test every year  starting at age 97.  Flexible sigmoidoscopy or colonoscopy. You may have a  sigmoidoscopy every 5 years or a colonoscopy every 10 years starting at age 50.  Hepatitis C blood test.  Hepatitis B blood test.  Sexually transmitted disease (STD) testing.  Diabetes screening. This is done by checking your blood sugar (glucose) after you have not eaten for a while (fasting). You may have this done every 1-3 years.  Bone density scan. This is done to screen for osteoporosis. You may have this done starting at age 4.  Mammogram. This may be done every 1-2 years. Talk to your health care provider about how often you should have regular mammograms. Talk with your health care provider about your test results, treatment options, and if necessary, the need for more tests. Vaccines  Your health care provider may recommend certain vaccines, such as:  Influenza vaccine. This is recommended every year.  Tetanus, diphtheria, and acellular pertussis (Tdap, Td) vaccine. You may need a Td booster every 10 years.  Zoster vaccine. You may need this after age 39.  Pneumococcal 13-valent conjugate (PCV13) vaccine. One dose is recommended after age 26.  Pneumococcal polysaccharide (PPSV23) vaccine. One dose is recommended after age 41. Talk to your health care provider about which screenings and vaccines you need and how often you need them. This information is not intended to replace advice given to you by your health care provider. Make sure you discuss any questions you have with your health care provider. Document Released: 07/27/2015 Document Revised: 03/19/2016 Document Reviewed: 05/01/2015 Elsevier Interactive Patient Education  2017 [employer] Prevention in the Home Falls can cause injuries. They can happen to people of all ages. There are many things you can do to make your home safe and to help prevent falls. What can I do on the outside of my home?  Regularly fix the edges of walkways and driveways and fix any cracks.  Remove anything that might make you  trip as you walk through a door, such as a raised step or threshold.  Trim any bushes or trees on the path to your home.  Use bright outdoor lighting.  Clear any walking paths of anything that might make someone trip, such as rocks or tools.  Regularly check to see if handrails are loose or broken. Make sure that both sides of any steps have handrails.  Any raised decks and porches should have guardrails on the edges.  Have any leaves, snow, or ice cleared regularly.  Use sand or salt on walking paths during winter.  Clean up any spills in your garage right away. This includes oil or grease spills. What can I do in the bathroom?  Use night lights.  Install grab bars by the toilet and in the tub and shower. Do not use towel bars as grab bars.  Use non-skid mats or decals in the tub or shower.  If you need to sit down in the shower, use a plastic, non-slip stool.  Keep the floor dry. Clean up any water that spills on the floor as soon as it happens.  Remove soap buildup in the tub or shower regularly.  Attach bath mats securely with double-sided non-slip rug tape.  Do not have throw rugs and other things on the floor that can make you trip. What can I do in the bedroom?  Use night lights.  Make sure that you have a light by your bed that is easy to reach.  Do not  use any sheets or blankets that are too big for your bed. They should not hang down onto the floor.  Have a firm chair that has side arms. You can use this for support while you get dressed.  Do not have throw rugs and other things on the floor that can make you trip. What can I do in the kitchen?  Clean up any spills right away.  Avoid walking on wet floors.  Keep items that you use a lot in easy-to-reach places.  If you need to reach something above you, use a strong step stool that has a grab bar.  Keep electrical cords out of the way.  Do not use floor polish or wax that makes floors slippery. If  you must use wax, use non-skid floor wax.  Do not have throw rugs and other things on the floor that can make you trip. What can I do with my stairs?  Do not leave any items on the stairs.  Make sure that there are handrails on both sides of the stairs and use them. Fix handrails that are broken or loose. Make sure that handrails are as long as the stairways.  Check any carpeting to make sure that it is firmly attached to the stairs. Fix any carpet that is loose or worn.  Avoid having throw rugs at the top or bottom of the stairs. If you do have throw rugs, attach them to the floor with carpet tape.  Make sure that you have a light switch at the top of the stairs and the bottom of the stairs. If you do not have them, ask someone to add them for you. What else can I do to help prevent falls?  Wear shoes that:  Do not have high heels.  Have rubber bottoms.  Are comfortable and fit you well.  Are closed at the toe. Do not wear sandals.  If you use a stepladder:  Make sure that it is fully opened. Do not climb a closed stepladder.  Make sure that both sides of the stepladder are locked into place.  Ask someone to hold it for you, if possible.  Clearly mark and make sure that you can see:  Any grab bars or handrails.  First and last steps.  Where the edge of each step is.  Use tools that help you move around (mobility aids) if they are needed. These include:  Canes.  Walkers.  Scooters.  Crutches.  Turn on the lights when you go into a dark area. Replace any light bulbs as soon as they burn out.  Set up your furniture so you have a clear path. Avoid moving your furniture around.  If any of your floors are uneven, fix them.  If there are any pets around you, be aware of where they are.  Review your medicines with your doctor. Some medicines can make you feel dizzy. This can increase your chance of falling. Ask your doctor what other things that you can do to  help prevent falls. This information is not intended to replace advice given to you by your health care provider. Make sure you discuss any questions you have with your health care provider. Document Released: 04/26/2009 Document Revised: 12/06/2015 Document Reviewed: 08/04/2014 Elsevier Interactive Patient Education  2017 [employer].

## 2020-12-13 NOTE — Addendum Note (Signed)
Addended by: Terence Lux A on: 12/13/2020 10:07 AM   Modules accepted: Orders

## 2020-12-13 NOTE — Progress Notes (Signed)
Patient consent obtained. Irrigation with water and peroxide performed. Full view of tympanic membranes after procedure.  Patient tolerated procedure well.   

## 2020-12-14 ENCOUNTER — Telehealth: Payer: Self-pay | Admitting: Dietician

## 2020-12-14 ENCOUNTER — Inpatient Hospital Stay: Payer: Medicare Other | Attending: Oncology | Admitting: Dietician

## 2020-12-14 DIAGNOSIS — I10 Essential (primary) hypertension: Secondary | ICD-10-CM | POA: Insufficient documentation

## 2020-12-14 DIAGNOSIS — Z5111 Encounter for antineoplastic chemotherapy: Secondary | ICD-10-CM | POA: Insufficient documentation

## 2020-12-14 DIAGNOSIS — R911 Solitary pulmonary nodule: Secondary | ICD-10-CM | POA: Insufficient documentation

## 2020-12-14 DIAGNOSIS — K259 Gastric ulcer, unspecified as acute or chronic, without hemorrhage or perforation: Secondary | ICD-10-CM | POA: Insufficient documentation

## 2020-12-14 DIAGNOSIS — J449 Chronic obstructive pulmonary disease, unspecified: Secondary | ICD-10-CM | POA: Insufficient documentation

## 2020-12-14 DIAGNOSIS — Z8616 Personal history of COVID-19: Secondary | ICD-10-CM | POA: Insufficient documentation

## 2020-12-14 DIAGNOSIS — Z87891 Personal history of nicotine dependence: Secondary | ICD-10-CM | POA: Insufficient documentation

## 2020-12-14 DIAGNOSIS — M542 Cervicalgia: Secondary | ICD-10-CM | POA: Insufficient documentation

## 2020-12-14 DIAGNOSIS — Z79899 Other long term (current) drug therapy: Secondary | ICD-10-CM | POA: Insufficient documentation

## 2020-12-14 DIAGNOSIS — C252 Malignant neoplasm of tail of pancreas: Secondary | ICD-10-CM | POA: Insufficient documentation

## 2020-12-14 NOTE — Telephone Encounter (Signed)
Nutrition  Attempted to contact pt via telephone for scheduled nutrition follow-up. Patient did not answer. Message with request for return call left on voicemail. Contact information provided

## 2020-12-19 ENCOUNTER — Inpatient Hospital Stay: Payer: Medicare Other

## 2020-12-19 ENCOUNTER — Other Ambulatory Visit: Payer: Self-pay

## 2020-12-19 ENCOUNTER — Other Ambulatory Visit: Payer: Self-pay | Admitting: Nurse Practitioner

## 2020-12-19 ENCOUNTER — Inpatient Hospital Stay (HOSPITAL_BASED_OUTPATIENT_CLINIC_OR_DEPARTMENT_OTHER): Payer: Medicare Other | Admitting: Nurse Practitioner

## 2020-12-19 ENCOUNTER — Encounter: Payer: Self-pay | Admitting: Nurse Practitioner

## 2020-12-19 ENCOUNTER — Ambulatory Visit (HOSPITAL_COMMUNITY)
Admission: RE | Admit: 2020-12-19 | Discharge: 2020-12-19 | Disposition: A | Discharge status: Home / Self Care | Payer: Medicare Other | Source: Ambulatory Visit | Attending: Nurse Practitioner | Admitting: Nurse Practitioner

## 2020-12-19 VITALS — BP 147/55 | HR 56 | Temp 97.8°F | Resp 18 | Ht 62.0 in | Wt 165.0 lb

## 2020-12-19 DIAGNOSIS — Z95828 Presence of other vascular implants and grafts: Secondary | ICD-10-CM

## 2020-12-19 DIAGNOSIS — Z5111 Encounter for antineoplastic chemotherapy: Secondary | ICD-10-CM | POA: Diagnosis not present

## 2020-12-19 DIAGNOSIS — C252 Malignant neoplasm of tail of pancreas: Secondary | ICD-10-CM

## 2020-12-19 DIAGNOSIS — K259 Gastric ulcer, unspecified as acute or chronic, without hemorrhage or perforation: Secondary | ICD-10-CM | POA: Diagnosis not present

## 2020-12-19 DIAGNOSIS — J449 Chronic obstructive pulmonary disease, unspecified: Secondary | ICD-10-CM | POA: Diagnosis not present

## 2020-12-19 DIAGNOSIS — M542 Cervicalgia: Secondary | ICD-10-CM | POA: Diagnosis not present

## 2020-12-19 DIAGNOSIS — Z8616 Personal history of COVID-19: Secondary | ICD-10-CM | POA: Diagnosis not present

## 2020-12-19 DIAGNOSIS — R911 Solitary pulmonary nodule: Secondary | ICD-10-CM | POA: Diagnosis not present

## 2020-12-19 DIAGNOSIS — I10 Essential (primary) hypertension: Secondary | ICD-10-CM | POA: Diagnosis not present

## 2020-12-19 DIAGNOSIS — Z87891 Personal history of nicotine dependence: Secondary | ICD-10-CM | POA: Diagnosis not present

## 2020-12-19 DIAGNOSIS — Z79899 Other long term (current) drug therapy: Secondary | ICD-10-CM | POA: Diagnosis not present

## 2020-12-19 LAB — CMP (CANCER CENTER ONLY)
ALT: 11 U/L (ref 0–44)
AST: 22 U/L (ref 15–41)
Albumin: 3.7 g/dL (ref 3.5–5.0)
Alkaline Phosphatase: 60 U/L (ref 38–126)
Anion gap: 9 (ref 5–15)
BUN: 12 mg/dL (ref 8–23)
CO2: 27 mmol/L (ref 22–32)
Calcium: 8.7 mg/dL — ABNORMAL LOW (ref 8.9–10.3)
Chloride: 105 mmol/L (ref 98–111)
Creatinine: 0.42 mg/dL — ABNORMAL LOW (ref 0.44–1.00)
GFR, Estimated: >60 mL/min (ref >60)
Glucose, Bld: 93 mg/dL (ref 70–99)
Potassium: 3.4 mmol/L — ABNORMAL LOW (ref 3.5–5.1)
Sodium: 141 mmol/L (ref 135–145)
Total Bilirubin: 0.5 mg/dL (ref 0.3–1.2)
Total Protein: 5.8 g/dL — ABNORMAL LOW (ref 6.5–8.1)

## 2020-12-19 LAB — CBC WITH DIFFERENTIAL (CANCER CENTER ONLY)
Abs Immature Granulocytes: 0.02 10*3/uL (ref 0.00–0.07)
Basophils Absolute: 0.1 10*3/uL (ref 0.0–0.1)
Basophils Relative: 1 %
Eosinophils Absolute: 0.1 10*3/uL (ref 0.0–0.5)
Eosinophils Relative: 1 %
HCT: 34.5 % — ABNORMAL LOW (ref 36.0–46.0)
Hemoglobin: 11.1 g/dL — ABNORMAL LOW (ref 12.0–15.0)
Immature Granulocytes: 0 %
Lymphocytes Relative: 45 %
Lymphs Abs: 3.8 10*3/uL (ref 0.7–4.0)
MCH: 30.4 pg (ref 26.0–34.0)
MCHC: 32.2 g/dL (ref 30.0–36.0)
MCV: 94.5 fL (ref 80.0–100.0)
Monocytes Absolute: 1.1 10*3/uL — ABNORMAL HIGH (ref 0.1–1.0)
Monocytes Relative: 13 %
Neutro Abs: 3.4 10*3/uL (ref 1.7–7.7)
Neutrophils Relative %: 40 %
Platelet Count: 186 10*3/uL (ref 150–400)
RBC: 3.65 MIL/uL — ABNORMAL LOW (ref 3.87–5.11)
RDW: 19.2 % — ABNORMAL HIGH (ref 11.5–15.5)
WBC Count: 8.5 10*3/uL (ref 4.0–10.5)
nRBC: 0 % (ref 0.0–0.2)

## 2020-12-19 MED ORDER — HEPARIN SOD (PORK) LOCK FLUSH 100 UNIT/ML IV SOLN
500.0000 [IU] | Freq: Once | INTRAVENOUS | Status: DC
  Filled 2020-12-19: qty 5

## 2020-12-19 MED ORDER — SODIUM CHLORIDE 0.9% FLUSH
10.0000 mL | INTRAVENOUS | Status: DC | PRN
  Administered 2020-12-19: 10 mL via INTRAVENOUS
  Filled 2020-12-19: qty 10

## 2020-12-19 NOTE — Progress Notes (Addendum)
[facility] OFFICE PROGRESS NOTE   Diagnosis: Pancreas cancer  INTERVAL HISTORY:   Amanda Arnold returns as scheduled.  She completed cycle 1 day 15 gemcitabine 12/05/2020.  She denies nausea/vomiting.  No mouth sores.  No diarrhea.  No fever following treatment.  No rash after treatment.  She does report a previous rash underneath both breast and in the groin regions.  She notes an alteration in taste while she is taking Xeloda.  Appetite is good the week off of Xeloda.  Main complaint today is left neck pain.  She reports having wax removed from both ears recently.  Since then she has had pain over the left neck.  Her daughter raises concern for a possible Port-A-Cath associated blood clot.  Objective:  Vital signs in last 24 hours:  Blood pressure (!) 147/55, pulse (!) 56, temperature 97.8 F (36.6 C), temperature source Oral, resp. rate 18, height 5\' 2"  (1.575 m), weight 165 lb (74.8 kg), SpO2 98 %.    HEENT: No thrush or ulcers. Resp: Lungs clear bilaterally. Cardio: Regular rate and rhythm. GI: Abdomen soft and nontender.  No hepatomegaly. Vascular: No arm or leg edema.  Left neck does not appear edematous.  No venous engorgement across the chest wall. Port-A-Cath without erythema.  Lab Results:  Lab Results  Component Value Date   WBC 8.5 12/19/2020   HGB 11.1 (L) 12/19/2020   HCT 34.5 (L) 12/19/2020   MCV 94.5 12/19/2020   PLT 186 12/19/2020   NEUTROABS 3.4 12/19/2020    Imaging:  No results found.  Medications: I have reviewed the patient's current medications.  Assessment/Plan: 1. Pancreas cancer-stage IIb (pT2pN1) ? CT abdomen/pelvis 06/01/2020-2.9 x 3 cm pancreas tail mass, slight increase in size of 7 mm right middle lobe nodule ? MRI/MRCP 06/22/2020-solid 3.8 x 3 x 3.1 cm pancreas tail mass with focal occlusion of the splenic vein and dilated venous collaterals, no evidence of metastatic disease, bilateral adrenal adenomas ? CT renal stone  study 08/02/2020-scattered airspace opacities throughout the lung bases-new favored to represent infectious/inflammatory changes ? EUS 08/16/2020-pancreas tail mass, T2N0, FNA biopsy-suspicious for malignancy ? CTs 08/27/2020-unchanged right middle lobe nodule, superficial left upper back lesion, no change in pancreas tail mass, occluded splenic vein with collaterals at the splenic hilum, no enlarged abdominal pelvic lymph nodes ? 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 2/15 lymph nodes positive, positive lymphovascular and perineural invasion, resection margins negative, pT2pN1 ? Cycle 1 gemcitabine/capecitabine 11/21/2020, day 15 gemcitabine 12/05/2020 ? Cycle 2 gemcitabine/capecitabine 12/20/2020 2. Gastric ulcer on EUS 08/16/2020-H. pylori positive 3. COVID-19 infection January 2022 4. COPD 5. Hypertension 6. Right abdomen/right leg pain-musculoskeletal? 7. Right middle lobe nodule on CT 06/01/2020, increased from 2017, stable on CT 11/13/2020 8. History of tobacco use 9. Superficial left upper back lesion noted on CT 08/27/2020  Disposition: Amanda Arnold appears stable.  She has completed 1 cycle of gemcitabine/capecitabine.  Aside from alteration in taste she seems to be tolerating treatment well.  CBC and chemistry panel from today reviewed.  Labs adequate to proceed with treatment.  Her main complaint today is pain along the left neck.  The Port-A-Cath device is located at the left chest.  We decided to hold today's treatment and refer for a venous Doppler study to rule out DVT which she will have later today.  She will return 12/20/2020 for cycle 2-day 1 Gemcitabine.  Next office visit in 2 weeks.  Ned Card ANP/GNP-BC   12/19/2020  1:25 PM  Addendum 4:02 PM- received call from vascular lab.  Doppler study negative for DVT.

## 2020-12-19 NOTE — Patient Instructions (Signed)

## 2020-12-20 ENCOUNTER — Inpatient Hospital Stay: Payer: Medicare Other

## 2020-12-20 ENCOUNTER — Ambulatory Visit: Payer: Medicare Other | Admitting: Internal Medicine

## 2020-12-20 ENCOUNTER — Other Ambulatory Visit: Payer: Self-pay

## 2020-12-20 VITALS — BP 126/57 | HR 53 | Temp 97.7°F | Resp 18

## 2020-12-20 DIAGNOSIS — K259 Gastric ulcer, unspecified as acute or chronic, without hemorrhage or perforation: Secondary | ICD-10-CM | POA: Diagnosis not present

## 2020-12-20 DIAGNOSIS — Z8616 Personal history of COVID-19: Secondary | ICD-10-CM | POA: Diagnosis not present

## 2020-12-20 DIAGNOSIS — Z5111 Encounter for antineoplastic chemotherapy: Secondary | ICD-10-CM | POA: Diagnosis not present

## 2020-12-20 DIAGNOSIS — I10 Essential (primary) hypertension: Secondary | ICD-10-CM | POA: Diagnosis not present

## 2020-12-20 DIAGNOSIS — Z79899 Other long term (current) drug therapy: Secondary | ICD-10-CM | POA: Diagnosis not present

## 2020-12-20 DIAGNOSIS — M542 Cervicalgia: Secondary | ICD-10-CM | POA: Diagnosis not present

## 2020-12-20 DIAGNOSIS — J449 Chronic obstructive pulmonary disease, unspecified: Secondary | ICD-10-CM | POA: Diagnosis not present

## 2020-12-20 DIAGNOSIS — C252 Malignant neoplasm of tail of pancreas: Secondary | ICD-10-CM | POA: Diagnosis not present

## 2020-12-20 DIAGNOSIS — R911 Solitary pulmonary nodule: Secondary | ICD-10-CM | POA: Diagnosis not present

## 2020-12-20 DIAGNOSIS — Z87891 Personal history of nicotine dependence: Secondary | ICD-10-CM | POA: Diagnosis not present

## 2020-12-20 MED ORDER — PROCHLORPERAZINE MALEATE 10 MG PO TABS
10.0000 mg | ORAL_TABLET | Freq: Once | ORAL | Status: AC
  Administered 2020-12-20: 10 mg via ORAL
  Filled 2020-12-20: qty 1

## 2020-12-20 MED ORDER — SODIUM CHLORIDE 0.9% FLUSH
10.0000 mL | INTRAVENOUS | Status: DC | PRN
  Administered 2020-12-20: 10 mL
  Filled 2020-12-20: qty 10

## 2020-12-20 MED ORDER — SODIUM CHLORIDE 0.9 % IV SOLN
1000.0000 mg/m2 | Freq: Once | INTRAVENOUS | Status: AC
  Administered 2020-12-20: 1824 mg via INTRAVENOUS
  Filled 2020-12-20: qty 47.97

## 2020-12-20 MED ORDER — SODIUM CHLORIDE 0.9 % IV SOLN
Freq: Once | INTRAVENOUS | Status: AC
Start: 2020-12-20 — End: 2020-12-20
  Filled 2020-12-20: qty 250

## 2020-12-20 MED ORDER — HEPARIN SOD (PORK) LOCK FLUSH 100 UNIT/ML IV SOLN
500.0000 [IU] | Freq: Once | INTRAVENOUS | Status: AC | PRN
Start: 2020-12-20 — End: 2020-12-20
  Administered 2020-12-20: 500 [IU]
  Filled 2020-12-20: qty 5

## 2020-12-20 NOTE — Progress Notes (Signed)
Per Ned Card, NP ok to use port a cath for treatment today.

## 2020-12-20 NOTE — Patient Instructions (Signed)
Amanda Arnold    Discharge Instructions:  Thank you for choosing [facility] to provide your oncology and hematology care.   If you have a lab appointment with the [facility], please go directly to the [facility] and check in at the registration area.   Wear comfortable clothing and clothing appropriate for easy access to any Portacath or PICC line.   We strive to give you quality time with your provider. You may need to reschedule your appointment if you arrive late (15 or more minutes).  Arriving late affects you and other patients whose appointments are after yours.  Also, if you miss three or more appointments without notifying the office, you may be dismissed from the clinic at the provider's discretion.      For prescription refill requests, have your pharmacy contact our office and allow 72 hours for refills to be completed.    Today you received the following chemotherapy and/or immunotherapy agents Gemcitabine   To help prevent nausea and vomiting after your treatment, we encourage you to take your nausea medication as directed.  BELOW ARE SYMPTOMS THAT SHOULD BE REPORTED IMMEDIATELY: *FEVER GREATER THAN 100.4 F (38 C) OR HIGHER *CHILLS OR SWEATING *NAUSEA AND VOMITING THAT IS NOT CONTROLLED WITH YOUR NAUSEA MEDICATION *UNUSUAL SHORTNESS OF BREATH *UNUSUAL BRUISING OR BLEEDING *URINARY PROBLEMS (pain or burning when urinating, or frequent urination) *BOWEL PROBLEMS (unusual diarrhea, constipation, pain near the anus) TENDERNESS IN MOUTH AND THROAT WITH OR WITHOUT PRESENCE OF ULCERS (sore throat, sores in mouth, or a toothache) UNUSUAL RASH, SWELLING OR PAIN  UNUSUAL VAGINAL DISCHARGE OR ITCHING   Items with * indicate a potential emergency and should be followed up as soon as possible or go to the Emergency Department if any problems should occur.  Please show the CHEMOTHERAPY ALERT CARD or IMMUNOTHERAPY ALERT CARD at check-in to  the Emergency Department and triage nurse.  Should you have questions after your visit or need to cancel or reschedule your appointment, please contact [facility]  Dept: [phone]  and follow the prompts.  Office hours are 8:00 a.m. to 4:30 p.m. Monday - Friday. Please note that voicemails left after 4:00 p.m. may not be returned until the following business day.  We are closed weekends and major holidays. You have access to a nurse at all times for urgent questions. Please call the main number to the clinic Dept: [phone] and follow the prompts.   For any non-urgent questions, you may also contact your provider using MyChart. We now offer e-Visits for anyone 51 and older to request care online for non-urgent symptoms. For details visit [email or web address].   Also download the MyChart app! Go to the app store, search "MyChart", open the app, select [facility], and log in with your MyChart username and password.  Due to Covid, a mask is required upon entering the hospital/clinic. If you do not have a mask, one will be given to you upon arrival. For doctor visits, patients may have 1 support person aged 24 or older with them. For treatment visits, patients cannot have anyone with them due to current Covid guidelines and our immunocompromised population.    Gemcitabine injection What is this medicine? GEMCITABINE (jem SYE ta been) is a chemotherapy drug. This medicine is used to treat many types of cancer like breast cancer, lung cancer, pancreatic cancer, and ovarian cancer. This medicine may be used for other purposes; ask your health care provider  or pharmacist if you have questions. COMMON BRAND NAME(S): Gemzar, Infugem What should I tell my health care provider before I take this medicine? They need to know if you have any of these conditions: blood disorders infection kidney disease liver disease lung or breathing disease, like asthma recent or  ongoing radiation therapy an unusual or allergic reaction to gemcitabine, other chemotherapy, other medicines, foods, dyes, or preservatives pregnant or trying to get pregnant breast-feeding How should I use this medicine? This drug is given as an infusion into a vein. It is administered in a hospital or clinic by a specially trained health care professional. Talk to your pediatrician regarding the use of this medicine in children. Special care may be needed. Overdosage: If you think you have taken too much of this medicine contact a poison control center or emergency room at once. NOTE: This medicine is only for you. Do not share this medicine with others. What if I miss a dose? It is important not to miss your dose. Call your doctor or health care professional if you are unable to keep an appointment. What may interact with this medicine? medicines to increase blood counts like filgrastim, pegfilgrastim, sargramostim some other chemotherapy drugs like cisplatin vaccines Talk to your doctor or health care professional before taking any of these medicines: acetaminophen aspirin ibuprofen ketoprofen naproxen This list may not describe all possible interactions. Give your health care provider a list of all the medicines, herbs, non-prescription drugs, or dietary supplements you use. Also tell them if you smoke, drink alcohol, or use illegal drugs. Some items may interact with your medicine. What should I watch for while using this medicine? Visit your doctor for checks on your progress. This drug may make you feel generally unwell. This is not uncommon, as chemotherapy can affect healthy cells as well as cancer cells. Report any side effects. Continue your course of treatment even though you feel ill unless your doctor tells you to stop. In some cases, you may be given additional medicines to help with side effects. Follow all directions for their use. Call your doctor or health care  professional for advice if you get a fever, chills or sore throat, or other symptoms of a cold or flu. Do not treat yourself. This drug decreases your body's ability to fight infections. Try to avoid being around people who are sick. This medicine may increase your risk to bruise or bleed. Call your doctor or health care professional if you notice any unusual bleeding. Be careful brushing and flossing your teeth or using a toothpick because you may get an infection or bleed more easily. If you have any dental work done, tell your dentist you are receiving this medicine. Avoid taking products that contain aspirin, acetaminophen, ibuprofen, naproxen, or ketoprofen unless instructed by your doctor. These medicines may hide a fever. Do not become pregnant while taking this medicine or for 6 months after stopping it. Women should inform their doctor if they wish to become pregnant or think they might be pregnant. Men should not father a child while taking this medicine and for 3 months after stopping it. There is a potential for serious side effects to an unborn child. Talk to your health care professional or pharmacist for more information. Do not breast-feed an infant while taking this medicine or for at least 1 week after stopping it. Men should inform their doctors if they wish to father a child. This medicine may lower sperm counts. Talk with your doctor or  health care professional if you are concerned about your fertility. What side effects may I notice from receiving this medicine? Side effects that you should report to your doctor or health care professional as soon as possible: allergic reactions like skin rash, itching or hives, swelling of the face, lips, or tongue breathing problems pain, redness, or irritation at site where injected signs and symptoms of a dangerous change in heartbeat or heart rhythm like chest pain; dizziness; fast or irregular heartbeat; palpitations; feeling faint or  lightheaded, falls; breathing problems signs of decreased platelets or bleeding - bruising, pinpoint red spots on the skin, black, tarry stools, blood in the urine signs of decreased red blood cells - unusually weak or tired, feeling faint or lightheaded, falls signs of infection - fever or chills, cough, sore throat, pain or difficulty passing urine signs and symptoms of kidney injury like trouble passing urine or change in the amount of urine signs and symptoms of liver injury like dark yellow or brown urine; general ill feeling or flu-like symptoms; light-colored stools; loss of appetite; nausea; right upper belly pain; unusually weak or tired; yellowing of the eyes or skin swelling of ankles, feet, hands Side effects that usually do not require medical attention (report to your doctor or health care professional if they continue or are bothersome): constipation diarrhea hair loss loss of appetite nausea rash vomiting This list may not describe all possible side effects. Call your doctor for medical advice about side effects. You may report side effects to FDA at 1-800-FDA-1088. Where should I keep my medicine? This drug is given in a hospital or clinic and will not be stored at home. NOTE: This sheet is a summary. It may not cover all possible information. If you have questions about this medicine, talk to your doctor, pharmacist, or health care provider.  2021 Elsevier/Gold Standard (2017-09-23 18:06:11)

## 2020-12-21 ENCOUNTER — Encounter (HOSPITAL_COMMUNITY): Payer: Self-pay | Admitting: Emergency Medicine

## 2020-12-21 ENCOUNTER — Other Ambulatory Visit: Payer: Self-pay

## 2020-12-21 ENCOUNTER — Ambulatory Visit (HOSPITAL_COMMUNITY)
Admission: EM | Admit: 2020-12-21 | Discharge: 2020-12-21 | Disposition: A | Discharge status: Home / Self Care | Payer: Medicare Other | Attending: Emergency Medicine | Admitting: Emergency Medicine

## 2020-12-21 DIAGNOSIS — H60332 Swimmer's ear, left ear: Secondary | ICD-10-CM

## 2020-12-21 MED ORDER — NEOMYCIN-POLYMYXIN-HC 3.5-10000-1 OT SUSP
4.0000 [drp] | Freq: Once | OTIC | Status: AC
  Administered 2020-12-21: 4 [drp] via OTIC

## 2020-12-21 MED ORDER — NEOMYCIN-POLYMYXIN-HC 3.5-10000-1 OT SUSP
OTIC | Status: AC
  Filled 2020-12-21: qty 10

## 2020-12-21 NOTE — ED Provider Notes (Signed)
[place]    CSN: [id number] Arrival date & time: 12/21/20  1841      History   Chief Complaint Chief Complaint  Patient presents with   Otalgia    Left   Facial Pain    HPI Amanda Arnold is a 75 y.o. female.   Patient here for evaluation of left ear and facial pain that has been ongoing for the past several days.  Reports being evaluated at 6.8 but states pain has not resolved in the left ear.  Has not tried any OTC medications or treatments.  Denies any trauma, injury, or other precipitating event.  Denies any specific alleviating or aggravating factors.  Denies any fevers, chest pain, shortness of breath, N/V/D, numbness, tingling, weakness, abdominal pain, or headaches.    The history is provided by the patient.  Otalgia  Past Medical History:  Diagnosis Date   Abdominal pain, epigastric 12/18/2009   ANXIETY 05/25/2006   BRONCHITIS, ACUTE 10/20/8117   Complication of anesthesia     difficulty waking up x 1 - "years ago"   Constipation    COPD 05/25/2006   DEPRESSION 01/02/2006   Diabetes mellitus without complication ([place])    type 2 - diet controlled - no meds   ESOTROPIA, LEFT EYE 12-12[birth year]   Family history of breast cancer 11/26/2020   FIBROMYALGIA 04/02/2006   GERD (gastroesophageal reflux disease)    Headache(784.0) 12/08/2007   History of blood transfusion    HYPERLIPIDEMIA 04/30/2006   HYPERTENSION 05/25/2006   Hypertension    INSOMNIA, PERSISTENT 02/22/2008   Lazy eye    left - no surgery   LOW BACK PAIN 01/27/2007   Migraines    NEURALGIA, 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/2009   Neuropathy    OBSTRUCTIVE SLEEP APNEA 04/17/2010   does not use CPAP   OSTEOARTHRITIS 05/25/2006   pancreatic ca dx'd 06/2020   Polyuria    Pre-diabetes    diet controlled   Psychosomatic disease 2011   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   T M J 05/11/2009    Patient Active Problem List   Diagnosis Date Noted   Genetic testing 12/04/2020   Family history of breast cancer  11/26/2020   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   Malnutrition of moderate degree 10/19/2020   Weight loss 09/04/2020   Cancer of pancreas, tail ([place]) 08/28/2020   Shoulder pain, right 07/10/2020   Pancreatic mass 06/26/2020   Coronary atherosclerosis 05/28/2020   Pulmonary nodule 05/28/2020   Palpitations 02/16/2020   Dysuria 07/27/2019   Piriformis syndrome 03/29/2019   Humerus fracture 01/27/2019   Closed fracture of left proximal humerus 12/07/2018   Trochanteric bursitis of right hip 07/12/2018   Trochanteric bursitis of both hips 03/23/2018   Neck pain 02/11/2018   Peroneal mononeuropathy, left 01/26/2017   Skin cyst 09/26/2016   Adjustment disorder with mixed anxiety and depressed mood 06/27/2016   Fatigue 03/28/2016   Restless leg syndrome 03/28/2016   Right lower quadrant abdominal pain 08/15/2015   Well adult exam 07/03/2015   Meralgia paraesthetica 05/29/2015   Paresthesia 05/15/2015   Sciatic leg pain 09/22/2014   Rash and nonspecific skin eruption 09/11/2014   Right lower quadrant pain 09/11/2014   Nonspecific abnormal electrocardiogram (ECG) (EKG) 07/27/2014   Precordial pain 07/27/2014   Cerumen impaction 06/16/2014   Chest pain, atypical 03/03/2014   UTI (urinary tract infection) 03/29/2013   Herpes zoster 03/02/2013   Abscess of left leg 02/02/2013   Leg pain, bilateral 12/09/2012   Left groin pain 09/09/2012  Unspecified sinusitis (chronic) 08/25/2012   Chest wall pain 05/20/2012   Nocturia 12/24/2011   Psychosomatic pain 10/14/2011   Left shoulder pain 08/22/2011   Vertigo 06/13/2011   Syncopal vertigo 06/13/2011   Intertriginous candidiasis 03/07/2011   Uncontrolled type 2 diabetes with renal manifestation ([place]) 03/07/2011   HIP PAIN 06/17/2010   HYPERGLYCEMIA 05/15/2010   CENTRAL SLEEP APNEA CONDS CLASSIFIED ELSEWHERE 05/14/2010   Obstructive sleep apnea 04/17/2010   Obesity 04/10/2010   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   DYSPNEA 03/15/2010    TRIGGER FINGER 12/18/2009   Abdominal pain, epigastric 12/18/2009   KNEE PAIN 07/11/2009   NEURALGIA, 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/2009   COLD SORE 05/11/2009   T M J 05/11/2009   BREAST PAIN 04/13/2009   Edema 03/01/2009   CERVICAL LYMPHADENOPATHY 03/01/2009   PRURITUS 07/28/2008   TACHYCARDIA 07/28/2008   INSOMNIA, PERSISTENT 02/22/2008   Cystitis 02/22/2008   SWEATING 01/18/2008   BRONCHITIS, ACUTE 12/08/2007   Chronic fatigue 12/08/2007   Headache(784.0) 12/08/2007   Cough 12/08/2007   ARTHRALGIA 11/19/2007   LOW BACK PAIN 01/27/2007   Tobacco use disorder 11/28/2006   Generalized anxiety disorder 05/25/2006   Essential hypertension 05/25/2006   COPD exacerbation ([place]) 05/25/2006   OSTEOARTHRITIS 05/25/2006   Polyuria 05/25/2006   Dyslipidemia 04/30/2006   Myalgia 04/02/2006   Anxiety and depression 01/02/2006   Disturbance of skin sensation 05/29/2005   ESOTROPIA, LEFT EYE 12-23-1945    Past Surgical History:  Procedure Laterality Date   BIOPSY  08/16/2020   Procedure: BIOPSY;  Surgeon: Irving Copas., MD;  Location: [facility] ENDOSCOPY;  Service: Endoscopy;;   CHOLECYSTECTOMY     COLONOSCOPY     ESOPHAGOGASTRODUODENOSCOPY (EGD) WITH PROPOFOL N/A 08/16/2020   Procedure: ESOPHAGOGASTRODUODENOSCOPY (EGD) WITH PROPOFOL;  Surgeon: Irving Copas., MD;  Location: [facility] ENDOSCOPY;  Service: Endoscopy;  Laterality: N/A;   EUS N/A 08/16/2020   Procedure: UPPER ENDOSCOPIC ULTRASOUND (EUS) RADIAL;  Surgeon: Rush Landmark Telford Nab., MD;  Location: [place];  Service: Endoscopy;  Laterality: N/A;   EYE SURGERY Bilateral    cataract with lens implant   FINE NEEDLE ASPIRATION  08/16/2020   Procedure: FINE NEEDLE ASPIRATION (FNA) LINEAR;  Surgeon: Irving Copas., MD;  Location: [place];  Service: Endoscopy;;   MASS EXCISION Left 10/06/2016   Procedure: EXCISION LEFT BUTTOCKS MASS;  Surgeon: Alphonsa Overall, MD;  Location: [place];  Service: General;   Laterality: Left;   PORTACATH PLACEMENT N/A 11/14/2020   Procedure: INSERTION PORT-A-CATH;  Surgeon: [name], MD;  Location: [place];  Service: General;  Laterality: N/A;   XI ROBOTIC ASSISTED LAPAROSCOPIC DISTAL PANCREATECTOMY N/A 10/17/2020   Procedure: XI ROBOTIC ASSISTED DISTAL PANCREATECTOMY AND SPLENECTOMY;  Surgeon: [name], MD;  Location: [place];  Service: General;  Laterality: N/A;    OB History   No obstetric history on file.      Home Medications    Prior to Admission medications   Medication Sig Start Date End Date Taking? Authorizing Provider  ALPRAZolam (XANAX) 0.5 MG tablet TAKE 1 TABLET(0.5 MG) BY MOUTH TWICE DAILY AS NEEDED FOR ANXIETY Patient taking differently: Take 0.5 mg by mouth 2 (two) times daily as needed for anxiety. 10/29/20   Plotnikov, Evie Lacks, MD  capecitabine (XELODA) 500 MG tablet Take 3 tablets (1500 mg) by mouth in AM and 3 tablets (1500 mg) by mouth in PM (total dose 3000 mg daily). Take within 30 minutes after meals. Take for 21 days on, 7 days off, repeat every 28 days.  12/19/20   Ladell Pier, MD  gabapentin (NEURONTIN) 300 MG capsule Take 1 capsule (300 mg total) by mouth 3 (three) times daily. Patient taking differently: Take 300 mg by mouth 3 (three) times daily as needed (pain). 09/04/20   Plotnikov, Evie Lacks, MD  HYDROcodone-acetaminophen (NORCO) 7.5-325 MG tablet Take 1 tablet by mouth every 8 (eight) hours as needed for severe pain. 11/14/20   [name], MD  ibuprofen (ADVIL) 600 MG tablet Take 600 mg by mouth every 6 (six) hours as needed.    [provider]  lidocaine-prilocaine (EMLA) cream Apply 1 application topically as directed. Apply to port site 2 hours prior to stick and cover with plastic wrap 11/14/20   Ladell Pier, MD  metoprolol succinate (TOPROL-XL) 25 MG 24 hr tablet TAKE 1/2 TABLET(12.5 MG) BY MOUTH IN THE MORNING AND AT 1/2 TABLET EVERY EVENING 11/19/20   Plotnikov, Evie Lacks, MD  nystatin (MYCOSTATIN/NYSTOP)  powder Apply 1 application topically 3 (three) times daily. 11/21/20   Ladell Pier, MD  ondansetron (ZOFRAN) 8 MG tablet Take 1 tablet (8 mg total) by mouth every 8 (eight) hours as needed for nausea or vomiting. Start 72 hours after IV chemo infusion 11/14/20   Ladell Pier, MD  prochlorperazine (COMPAZINE) 10 MG tablet Take 1 tablet (10 mg total) by mouth every 6 (six) hours as needed for nausea. 11/14/20   Ladell Pier, MD  rosuvastatin (CRESTOR) 10 MG tablet Take 1 tablet (10 mg total) by mouth daily. Patient taking differently: Take 10 mg by mouth in the morning. 04/25/20   Jerline Pain, MD    Family History Family History  Problem Relation Age of Onset   Heart attack Father    Hypertension Brother    Heart disease Mother    Cervical cancer Mother        dx unknown age   Skin cancer Mother        dx unknown age   Breast cancer Maternal Aunt        dx after 36   Cancer Maternal Uncle        ? colon; dx after 22   Leukemia Brother        d. >50   Skin cancer Daughter        basal cell carcinoma   Colon cancer Neg Hx    Stomach cancer Neg Hx     Social History Social History   Tobacco Use   Smoking status: Former    Pack years: 0.00    Types: Cigarettes    Quit date: 07/10/2013    Years since quitting: 7.4   Smokeless tobacco: Current    Types: Snuff  Vaping Use   Vaping Use: Never used  Substance Use Topics   Alcohol use: No    Alcohol/week: 0.0 standard drinks   Drug use: No     Allergies   Morphine and related, Bupropion hcl, Ezetimibe, Metformin and related, Naproxen, Trazodone hcl, and Tramadol hcl   Review of Systems Review of Systems  HENT:  Positive for ear pain.   All other systems reviewed and are negative.   Physical Exam Triage Vital Signs ED Triage Vitals  Enc Vitals Group     BP 12/21/20 1921 (!) 164/73     Pulse Rate 12/21/20 1921 62     Resp 12/21/20 1921 17     Temp 12/21/20 1921 97.9 F (36.6 C)     Temp Source 12/21/20  1921 Oral  SpO2 12/21/20 1921 97 %     Weight --      Height --      Head Circumference --      Peak Flow --      Pain Score 12/21/20 1919 7     Pain Loc --      Pain Edu? --      Excl. in [place]? --    No data found.  Updated Vital Signs BP (!) 164/73 (BP Location: Left Arm)   Pulse 62   Temp 97.9 F (36.6 C) (Oral)   Resp 17   LMP  (LMP Unknown)   SpO2 97%   Visual Acuity Right Eye Distance:   Left Eye Distance:   Bilateral Distance:    Right Eye Near:   Left Eye Near:    Bilateral Near:     Physical Exam Vitals and nursing note reviewed.  Constitutional:      General: She is not in acute distress.    Appearance: Normal appearance. She is not ill-appearing, toxic-appearing or diaphoretic.  HENT:     Head: Normocephalic and atraumatic.     Right Ear: Tympanic membrane, ear canal and external ear normal.     Left Ear: Swelling and tenderness present.  Eyes:     Conjunctiva/sclera: Conjunctivae normal.  Cardiovascular:     Rate and Rhythm: Normal rate.     Pulses: Normal pulses.  Pulmonary:     Effort: Pulmonary effort is normal.  Abdominal:     General: Abdomen is flat.  Musculoskeletal:        General: Normal range of motion.     Cervical back: Normal range of motion.  Skin:    General: Skin is warm and dry.  Neurological:     General: No focal deficit present.     Mental Status: She is alert and oriented to person, place, and time.  Psychiatric:        Mood and Affect: Mood normal.     UC Treatments / Results  Labs (all labs ordered are listed, but only abnormal results are displayed) Labs Reviewed - No data to display  EKG   Radiology No results found.  Procedures Procedures (including critical care time)  Medications Ordered in UC Medications  neomycin-polymyxin-hydrocortisone (CORTISPORIN) OTIC (EAR) suspension 4 drop (4 drops Left EAR Given 12/21/20 1936)    Initial Impression / Assessment and Plan / UC Course  I have reviewed the  triage vital signs and the nursing notes.  Pertinent labs & imaging results that were available during my care of the patient were reviewed by me and considered in my medical decision making (see chart for details).    Assessment negative for red flags or concerns.  Otitis externa.  Will treat with Cortisporin daily for the next 5 to 7 days.  Cortisporin 4 drops instilled into the left ear in office.  May take Tylenol and/or ibuprofen as needed for pain and fevers.  Follow-up with primary care as needed.   Final Clinical Impressions(s) / UC Diagnoses   Final diagnoses:  Acute swimmer's ear of left side     Discharge Instructions      Instill 4 drops of the Cortisporin daily for the next 5-7 days.    You can take Tylenol and/or Ibuprofen as needed for pain and fevers.     Return or go to the Emergency Department if symptoms worsen or do not improve in the next few days.  ED Prescriptions   None    PDMP not reviewed this encounter.   Pearson Forster, NP 12/21/20 [phone]

## 2020-12-21 NOTE — ED Triage Notes (Signed)
Pt presents with on going left ear and facial pain. States had ears cleaned out on 6/8 and the pain has not subsided.

## 2020-12-21 NOTE — Discharge Instructions (Addendum)
Instill 4 drops of the Cortisporin daily for the next 5-7 days.    You can take Tylenol and/or Ibuprofen as needed for pain and fevers.     Return or go to the Emergency Department if symptoms worsen or do not improve in the next few days.

## 2020-12-27 ENCOUNTER — Ambulatory Visit: Payer: Medicare Other | Admitting: Internal Medicine

## 2020-12-29 ENCOUNTER — Other Ambulatory Visit: Payer: Self-pay | Admitting: Oncology

## 2020-12-31 ENCOUNTER — Ambulatory Visit: Payer: Medicare Other | Admitting: Internal Medicine

## 2021-01-02 ENCOUNTER — Inpatient Hospital Stay: Payer: Medicare Other

## 2021-01-02 ENCOUNTER — Inpatient Hospital Stay: Payer: Medicare Other | Admitting: Oncology

## 2021-01-03 ENCOUNTER — Other Ambulatory Visit: Payer: Self-pay | Admitting: Oncology

## 2021-01-03 ENCOUNTER — Other Ambulatory Visit: Payer: Self-pay

## 2021-01-03 ENCOUNTER — Inpatient Hospital Stay: Payer: Medicare Other

## 2021-01-03 ENCOUNTER — Other Ambulatory Visit (HOSPITAL_COMMUNITY): Payer: Self-pay

## 2021-01-03 ENCOUNTER — Inpatient Hospital Stay: Payer: Medicare Other | Admitting: Oncology

## 2021-01-03 VITALS — BP 137/62 | HR 59 | Temp 97.8°F | Resp 18 | Ht 62.0 in | Wt 160.4 lb

## 2021-01-03 DIAGNOSIS — C252 Malignant neoplasm of tail of pancreas: Secondary | ICD-10-CM

## 2021-01-03 DIAGNOSIS — Z79899 Other long term (current) drug therapy: Secondary | ICD-10-CM | POA: Diagnosis not present

## 2021-01-03 DIAGNOSIS — J449 Chronic obstructive pulmonary disease, unspecified: Secondary | ICD-10-CM | POA: Diagnosis not present

## 2021-01-03 DIAGNOSIS — K259 Gastric ulcer, unspecified as acute or chronic, without hemorrhage or perforation: Secondary | ICD-10-CM | POA: Diagnosis not present

## 2021-01-03 DIAGNOSIS — Z5111 Encounter for antineoplastic chemotherapy: Secondary | ICD-10-CM | POA: Diagnosis not present

## 2021-01-03 DIAGNOSIS — Z87891 Personal history of nicotine dependence: Secondary | ICD-10-CM | POA: Diagnosis not present

## 2021-01-03 DIAGNOSIS — I10 Essential (primary) hypertension: Secondary | ICD-10-CM | POA: Diagnosis not present

## 2021-01-03 DIAGNOSIS — M542 Cervicalgia: Secondary | ICD-10-CM | POA: Diagnosis not present

## 2021-01-03 DIAGNOSIS — R911 Solitary pulmonary nodule: Secondary | ICD-10-CM | POA: Diagnosis not present

## 2021-01-03 DIAGNOSIS — Z8616 Personal history of COVID-19: Secondary | ICD-10-CM | POA: Diagnosis not present

## 2021-01-03 LAB — CMP (CANCER CENTER ONLY)
ALT: 12 U/L (ref 0–44)
AST: 23 U/L (ref 15–41)
Albumin: 4.1 g/dL (ref 3.5–5.0)
Alkaline Phosphatase: 66 U/L (ref 38–126)
Anion gap: 8 (ref 5–15)
BUN: 17 mg/dL (ref 8–23)
CO2: 27 mmol/L (ref 22–32)
Calcium: 9.5 mg/dL (ref 8.9–10.3)
Chloride: 105 mmol/L (ref 98–111)
Creatinine: 0.44 mg/dL (ref 0.44–1.00)
GFR, Estimated: >60 mL/min (ref >60)
Glucose, Bld: 113 mg/dL — ABNORMAL HIGH (ref 70–99)
Potassium: 4.1 mmol/L (ref 3.5–5.1)
Sodium: 140 mmol/L (ref 135–145)
Total Bilirubin: 0.8 mg/dL (ref 0.3–1.2)
Total Protein: 6.2 g/dL — ABNORMAL LOW (ref 6.5–8.1)

## 2021-01-03 LAB — CBC WITH DIFFERENTIAL (CANCER CENTER ONLY)
Abs Immature Granulocytes: 0.01 10*3/uL (ref 0.00–0.07)
Basophils Absolute: 0.1 10*3/uL (ref 0.0–0.1)
Basophils Relative: 1 %
Eosinophils Absolute: 0.1 10*3/uL (ref 0.0–0.5)
Eosinophils Relative: 1 %
HCT: 36.9 % (ref 36.0–46.0)
Hemoglobin: 11.8 g/dL — ABNORMAL LOW (ref 12.0–15.0)
Immature Granulocytes: 0 %
Lymphocytes Relative: 38 %
Lymphs Abs: 3 10*3/uL (ref 0.7–4.0)
MCH: 31 pg (ref 26.0–34.0)
MCHC: 32 g/dL (ref 30.0–36.0)
MCV: 96.9 fL (ref 80.0–100.0)
Monocytes Absolute: 1 10*3/uL (ref 0.1–1.0)
Monocytes Relative: 12 %
Neutro Abs: 3.8 10*3/uL (ref 1.7–7.7)
Neutrophils Relative %: 48 %
Platelet Count: 210 10*3/uL (ref 150–400)
RBC: 3.81 MIL/uL — ABNORMAL LOW (ref 3.87–5.11)
RDW: 22.5 % — ABNORMAL HIGH (ref 11.5–15.5)
WBC Count: 8 10*3/uL (ref 4.0–10.5)
nRBC: 0.4 % — ABNORMAL HIGH (ref 0.0–0.2)

## 2021-01-03 MED ORDER — PROCHLORPERAZINE MALEATE 10 MG PO TABS
10.0000 mg | ORAL_TABLET | Freq: Once | ORAL | Status: AC
Start: 2021-01-03 — End: 2021-01-03
  Administered 2021-01-03: 10 mg via ORAL
  Filled 2021-01-03: qty 1

## 2021-01-03 MED ORDER — SODIUM CHLORIDE 0.9 % IV SOLN
Freq: Once | INTRAVENOUS | Status: AC
  Filled 2021-01-03: qty 250

## 2021-01-03 MED ORDER — CAPECITABINE 500 MG PO TABS: ORAL_TABLET | ORAL | 0 refills | Status: DC

## 2021-01-03 MED ORDER — SODIUM CHLORIDE 0.9 % IV SOLN
1000.0000 mg/m2 | Freq: Once | INTRAVENOUS | Status: AC
  Administered 2021-01-03: 1824 mg via INTRAVENOUS
  Filled 2021-01-03: qty 47.97

## 2021-01-03 MED ORDER — HEPARIN SOD (PORK) LOCK FLUSH 100 UNIT/ML IV SOLN
500.0000 [IU] | Freq: Once | INTRAVENOUS | Status: AC | PRN
  Administered 2021-01-03: 500 [IU]
  Filled 2021-01-03: qty 5

## 2021-01-03 NOTE — Progress Notes (Signed)
  [facility] OFFICE PROGRESS NOTE   Diagnosis: Pancreas cancer  INTERVAL HISTORY:   [name] returns as scheduled.  She continues treatment with gemcitabine and Xeloda.  No fever, rash, nausea, mouth sores, diarrhea, or hand/foot pain.  She reports malaise in the mornings.  This improves during the day.  Objective:  Vital signs in last 24 hours:  Blood pressure 137/62, pulse (!) 59, temperature 97.8 F (36.6 C), resp. rate 18, height 5\' 2"  (1.575 m), weight 160 lb 6.4 oz (72.8 kg), SpO2 98 %.    HEENT: No thrush or ulcers Resp: Lungs clear bilaterally Cardio: Regular rate and rhythm GI: No hepatosplenomegaly, nontender, no mass Vascular: No leg edema Skin: Palms without erythema  Portacath/PICC-without erythema  Lab Results:  Lab Results  Component Value Date   WBC 8.0 01/03/2021   HGB 11.8 (L) 01/03/2021   HCT 36.9 01/03/2021   MCV 96.9 01/03/2021   PLT 210 01/03/2021   NEUTROABS 3.8 01/03/2021    CMP  Lab Results  Component Value Date   NA 140 01/03/2021   K 4.1 01/03/2021   CL 105 01/03/2021   CO2 27 01/03/2021   GLUCOSE 113 (H) 01/03/2021   BUN 17 01/03/2021   CREATININE 0.44 01/03/2021   CALCIUM 9.5 01/03/2021   PROT 6.2 (L) 01/03/2021   ALBUMIN 4.1 01/03/2021   AST 23 01/03/2021   ALT 12 01/03/2021   ALKPHOS 66 01/03/2021   BILITOT 0.8 01/03/2021   GFRNONAA >60 01/03/2021   GFRAA 103 04/20/2020     Medications: I have reviewed the patient's current medications.   Assessment/Plan: Pancreas cancer-stage IIb (pT2pN1) CT abdomen/pelvis 06/01/2020-2.9 x 3 cm pancreas tail mass, slight increase in size of 7 mm right middle lobe nodule MRI/MRCP 06/22/2020-solid 3.8 x 3 x 3.1 cm pancreas tail mass with focal occlusion of the splenic vein and dilated venous collaterals, no evidence of metastatic disease, bilateral adrenal adenomas CT renal stone study 08/02/2020-scattered airspace opacities throughout the lung bases-new favored to  represent infectious/inflammatory changes EUS 08/16/2020-pancreas tail mass, T2N0, FNA biopsy-suspicious for malignancy CTs 08/27/2020-unchanged right middle lobe nodule, superficial left upper back lesion, no change in pancreas tail mass, occluded splenic vein with collaterals at the splenic hilum, no enlarged abdominal pelvic lymph nodes Distal pancreatectomy/splenectomy 10/17/2020- 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  2/15 lymph nodes positive, positive lymphovascular and perineural invasion, resection margins negative, pT2pN1 Cycle 1 gemcitabine/capecitabine 11/21/2020, day 15 gemcitabine 12/05/2020 Cycle 2 gemcitabine/capecitabine 12/20/2020 2.   Gastric ulcer on EUS 08/16/2020-H. pylori positive 3.   COVID-19 infection January 2022 4.   COPD 5.   Hypertension 6.   Right abdomen/right leg pain-musculoskeletal? 7.   Right middle lobe nodule on CT 06/01/2020, increased from 2017, stable on CT 11/13/2020 8.   History of tobacco use 9.   Superficial left upper back lesion noted on CT 08/27/2020    Disposition: [name] is completing cycle 2 gemcitabine/capecitabine.  She is tolerating the treatment well.  She will complete day 15 gemcitabine today.  She will return for an office visit and cycle 3 gemcitabine/capecitabine on 01/16/2021.  Betsy Coder, MD  01/03/2021  11:46 AM

## 2021-01-03 NOTE — Patient Instructions (Signed)
Implanted Port Home Guide An implanted port is a device that is placed under the skin. It is usually placed in the chest. The device can be used to give IV medicine, to take blood, or for dialysis. You may have an implanted port if: You need IV medicine that would be irritating to the small veins in your hands or arms. You need IV medicines, such as antibiotics, for a long period of time. You need IV nutrition for a long period of time. You need dialysis. When you have a port, your health care provider can choose to use the port instead of veins in your arms for these procedures. You may have fewer limitations when using a port than you would if you used other types of long-term IVs, and you will likely be able to return to normal activities afteryour incision heals. An implanted port has two main parts: Reservoir. The reservoir is the part where a needle is inserted to give medicines or draw blood. The reservoir is round. After it is placed, it appears as a small, raised area under your skin. Catheter. The catheter is a thin, flexible tube that connects the reservoir to a vein. Medicine that is inserted into the reservoir goes into the catheter and then into the vein. How is my port accessed? To access your port: A numbing cream may be placed on the skin over the port site. Your health care provider will put on a mask and sterile gloves. The skin over your port will be cleaned carefully with a germ-killing soap and allowed to dry. Your health care provider will gently pinch the port and insert a needle into it. Your health care provider will check for a blood return to make sure the port is in the vein and is not clogged. If your port needs to remain accessed to get medicine continuously (constant infusion), your health care provider will place a clear bandage (dressing) over the needle site. The dressing and needle will need to be changed every week, or as told by your health care provider. What  is flushing? Flushing helps keep the port from getting clogged. Follow instructions from your health care provider about how and when to flush the port. Ports are usually flushed with saline solution or a medicine called heparin. The need for flushing will depend on how the port is used: If the port is only used from time to time to give medicines or draw blood, the port may need to be flushed: Before and after medicines have been given. Before and after blood has been drawn. As part of routine maintenance. Flushing may be recommended every 4-6 weeks. If a constant infusion is running, the port may not need to be flushed. Throw away any syringes in a disposal container that is meant for sharp items (sharps container). You can buy a sharps container from a pharmacy, or you can make one by using an empty hard plastic bottle with a cover. How long will my port stay implanted? The port can stay in for as long as your health care provider thinks it is needed. When it is time for the port to come out, a surgery will be done to remove it. The surgery will be similar to the procedure that was done to putthe port in. Follow these instructions at home:  Flush your port as told by your health care provider. If you need an infusion over several days, follow instructions from your health care provider about how to take   care of your port site. Make sure you: Wash your hands with soap and water before you change your dressing. If soap and water are not available, use alcohol-based hand sanitizer. Change your dressing as told by your health care provider. Place any used dressings or infusion bags into a plastic bag. Throw that bag in the trash. Keep the dressing that covers the needle clean and dry. Do not get it wet. Do not use scissors or sharp objects near the tube. Keep the tube clamped, unless it is being used. Check your port site every day for signs of infection. Check for: Redness, swelling, or  pain. Fluid or blood. Pus or a bad smell. Protect the skin around the port site. Avoid wearing bra straps that rub or irritate the site. Protect the skin around your port from seat belts. Place a soft pad over your chest if needed. Bathe or shower as told by your health care provider. The site may get wet as long as you are not actively receiving an infusion. Return to your normal activities as told by your health care provider. Ask your health care provider what activities are safe for you. Carry a medical alert card or wear a medical alert bracelet at all times. This will let health care providers know that you have an implanted port in case of an emergency. Get help right away if: You have redness, swelling, or pain at the port site. You have fluid or blood coming from your port site. You have pus or a bad smell coming from the port site. You have a fever. Summary Implanted ports are usually placed in the chest for long-term IV access. Follow instructions from your health care provider about flushing the port and changing bandages (dressings). Take care of the area around your port by avoiding clothing that puts pressure on the area, and by watching for signs of infection. Protect the skin around your port from seat belts. Place a soft pad over your chest if needed. Get help right away if you have a fever or you have redness, swelling, pain, drainage, or a bad smell at the port site. This information is not intended to replace advice given to you by your health care provider. Make sure you discuss any questions you have with your healthcare provider. Document Revised: 11/14/2019 Document Reviewed: 11/14/2019 Elsevier Patient Education  2022 [employer]  

## 2021-01-03 NOTE — Patient Instructions (Signed)
[facility]  Discharge Instructions: Thank you for choosing [facility] to provide your oncology and hematology care.   If you have a lab appointment with the [facility], please go directly to the [facility] and check in at the registration area.   Wear comfortable clothing and clothing appropriate for easy access to any Portacath or PICC line.   We strive to give you quality time with your provider. You may need to reschedule your appointment if you arrive late (15 or more minutes).  Arriving late affects you and other patients whose appointments are after yours.  Also, if you miss three or more appointments without notifying the office, you may be dismissed from the clinic at the provider's discretion.      For prescription refill requests, have your pharmacy contact our office and allow 72 hours for refills to be completed.    Today you received the following chemotherapy and/or immunotherapy agents Gemcitabine      To help prevent nausea and vomiting after your treatment, we encourage you to take your nausea medication as directed.  BELOW ARE SYMPTOMS THAT SHOULD BE REPORTED IMMEDIATELY: *FEVER GREATER THAN 100.4 F (38 C) OR HIGHER *CHILLS OR SWEATING *NAUSEA AND VOMITING THAT IS NOT CONTROLLED WITH YOUR NAUSEA MEDICATION *UNUSUAL SHORTNESS OF BREATH *UNUSUAL BRUISING OR BLEEDING *URINARY PROBLEMS (pain or burning when urinating, or frequent urination) *BOWEL PROBLEMS (unusual diarrhea, constipation, pain near the anus) TENDERNESS IN MOUTH AND THROAT WITH OR WITHOUT PRESENCE OF ULCERS (sore throat, sores in mouth, or a toothache) UNUSUAL RASH, SWELLING OR PAIN  UNUSUAL VAGINAL DISCHARGE OR ITCHING   Items with * indicate a potential emergency and should be followed up as soon as possible or go to the Emergency Department if any problems should occur.  Please show the CHEMOTHERAPY ALERT CARD or IMMUNOTHERAPY ALERT CARD at check-in to  the Emergency Department and triage nurse.  Should you have questions after your visit or need to cancel or reschedule your appointment, please contact [facility]  Dept: [phone]  and follow the prompts.  Office hours are 8:00 a.m. to 4:30 p.m. Monday - Friday. Please note that voicemails left after 4:00 p.m. may not be returned until the following business day.  We are closed weekends and major holidays. You have access to a nurse at all times for urgent questions. Please call the main number to the clinic Dept: [phone] and follow the prompts.   For any non-urgent questions, you may also contact your provider using MyChart. We now offer e-Visits for anyone 73 and older to request care online for non-urgent symptoms. For details visit [email or web address].   Also download the MyChart app! Go to the app store, search "MyChart", open the app, select [facility], and log in with your MyChart username and password.  Due to Covid, a mask is required upon entering the hospital/clinic. If you do not have a mask, one will be given to you upon arrival. For doctor visits, patients may have 1 support person aged 73 or older with them. For treatment visits, patients cannot have anyone with them due to current Covid guidelines and our immunocompromised population.

## 2021-01-04 ENCOUNTER — Other Ambulatory Visit: Payer: Self-pay | Admitting: Oncology

## 2021-01-04 ENCOUNTER — Other Ambulatory Visit: Payer: Self-pay | Admitting: Pharmacist

## 2021-01-04 ENCOUNTER — Other Ambulatory Visit (HOSPITAL_COMMUNITY): Payer: Self-pay

## 2021-01-04 DIAGNOSIS — C252 Malignant neoplasm of tail of pancreas: Secondary | ICD-10-CM

## 2021-01-04 MED ORDER — CAPECITABINE 500 MG PO TABS
1500.0000 mg | ORAL_TABLET | Freq: Two times a day (BID) | ORAL | 0 refills | Status: DC
  Filled 2021-01-04 (×2): qty 126, 28d supply, fill #0

## 2021-01-04 NOTE — Progress Notes (Signed)
Oral Chemotherapy Pharmacist Encounter   Xeloda prescription sent to patient's local pharmacy in error. Redirected Rx to her normal pharmacy, [facility].   Darl Pikes, PharmD, BCPS, BCOP, CPP Hematology/Oncology Clinical Pharmacist [facility]/HP/AP [facility] [phone]  01/04/2021 3:41 PM

## 2021-01-13 ENCOUNTER — Other Ambulatory Visit: Payer: Self-pay | Admitting: Oncology

## 2021-01-16 ENCOUNTER — Encounter: Payer: Self-pay | Admitting: Nurse Practitioner

## 2021-01-16 ENCOUNTER — Inpatient Hospital Stay: Payer: Medicare Other | Admitting: Nurse Practitioner

## 2021-01-16 ENCOUNTER — Inpatient Hospital Stay: Payer: Medicare Other

## 2021-01-16 ENCOUNTER — Inpatient Hospital Stay: Payer: Medicare Other | Attending: Oncology

## 2021-01-16 ENCOUNTER — Other Ambulatory Visit: Payer: Self-pay

## 2021-01-16 VITALS — BP 127/56 | HR 65 | Temp 97.7°F | Resp 18 | Ht 62.0 in | Wt 163.0 lb

## 2021-01-16 DIAGNOSIS — Z5111 Encounter for antineoplastic chemotherapy: Secondary | ICD-10-CM | POA: Diagnosis not present

## 2021-01-16 DIAGNOSIS — C252 Malignant neoplasm of tail of pancreas: Secondary | ICD-10-CM

## 2021-01-16 LAB — CMP (CANCER CENTER ONLY)
ALT: 15 U/L (ref 0–44)
AST: 27 U/L (ref 15–41)
Albumin: 4 g/dL (ref 3.5–5.0)
Alkaline Phosphatase: 72 U/L (ref 38–126)
Anion gap: 7 (ref 5–15)
BUN: 19 mg/dL (ref 8–23)
CO2: 28 mmol/L (ref 22–32)
Calcium: 9.5 mg/dL (ref 8.9–10.3)
Chloride: 104 mmol/L (ref 98–111)
Creatinine: 0.49 mg/dL (ref 0.44–1.00)
GFR, Estimated: >60 mL/min (ref >60)
Glucose, Bld: 122 mg/dL — ABNORMAL HIGH (ref 70–99)
Potassium: 4.1 mmol/L (ref 3.5–5.1)
Sodium: 139 mmol/L (ref 135–145)
Total Bilirubin: 0.5 mg/dL (ref 0.3–1.2)
Total Protein: 6.6 g/dL (ref 6.5–8.1)

## 2021-01-16 LAB — CBC WITH DIFFERENTIAL (CANCER CENTER ONLY)
Abs Immature Granulocytes: 0.01 10*3/uL (ref 0.00–0.07)
Basophils Absolute: 0 10*3/uL (ref 0.0–0.1)
Basophils Relative: 1 %
Eosinophils Absolute: 0.2 10*3/uL (ref 0.0–0.5)
Eosinophils Relative: 2 %
HCT: 35.8 % — ABNORMAL LOW (ref 36.0–46.0)
Hemoglobin: 11.8 g/dL — ABNORMAL LOW (ref 12.0–15.0)
Immature Granulocytes: 0 %
Lymphocytes Relative: 35 %
Lymphs Abs: 2.9 10*3/uL (ref 0.7–4.0)
MCH: 32.4 pg (ref 26.0–34.0)
MCHC: 33 g/dL (ref 30.0–36.0)
MCV: 98.4 fL (ref 80.0–100.0)
Monocytes Absolute: 1.1 10*3/uL — ABNORMAL HIGH (ref 0.1–1.0)
Monocytes Relative: 13 %
Neutro Abs: 4.1 10*3/uL (ref 1.7–7.7)
Neutrophils Relative %: 49 %
Platelet Count: 179 10*3/uL (ref 150–400)
RBC: 3.64 MIL/uL — ABNORMAL LOW (ref 3.87–5.11)
RDW: 24.9 % — ABNORMAL HIGH (ref 11.5–15.5)
WBC Count: 8.4 10*3/uL (ref 4.0–10.5)
nRBC: 0 % (ref 0.0–0.2)

## 2021-01-16 MED ORDER — SODIUM CHLORIDE 0.9 % IV SOLN
1000.0000 mg/m2 | Freq: Once | INTRAVENOUS | Status: AC
  Administered 2021-01-16: 1786 mg via INTRAVENOUS
  Filled 2021-01-16: qty 46.97

## 2021-01-16 MED ORDER — SODIUM CHLORIDE 0.9% FLUSH
10.0000 mL | INTRAVENOUS | Status: DC | PRN
  Administered 2021-01-16: 10 mL
  Filled 2021-01-16: qty 10

## 2021-01-16 MED ORDER — HEPARIN SOD (PORK) LOCK FLUSH 100 UNIT/ML IV SOLN
500.0000 [IU] | Freq: Once | INTRAVENOUS | Status: AC | PRN
  Administered 2021-01-16: 500 [IU]
  Filled 2021-01-16: qty 5

## 2021-01-16 MED ORDER — PROCHLORPERAZINE MALEATE 10 MG PO TABS
10.0000 mg | ORAL_TABLET | Freq: Once | ORAL | Status: AC
  Administered 2021-01-16: 10 mg via ORAL
  Filled 2021-01-16: qty 1

## 2021-01-16 MED ORDER — SODIUM CHLORIDE 0.9 % IV SOLN
Freq: Once | INTRAVENOUS | Status: AC
  Filled 2021-01-16: qty 250

## 2021-01-16 NOTE — Progress Notes (Signed)
  [facility] OFFICE PROGRESS NOTE   Diagnosis: Pancreas cancer  INTERVAL HISTORY:   Amanda Arnold returns as scheduled.  She completed cycle 2 gemcitabine/capecitabine beginning 12/20/2020.  She denies nausea/vomiting.  No mouth sores.  No diarrhea.  No hand or foot pain or redness.  No fever or rash following Gemcitabine.  No shortness of breath or cough.  Objective:  Vital signs in last 24 hours:  Blood pressure (!) 127/56, pulse 65, temperature 97.7 F (36.5 C), temperature source Oral, resp. rate 18, height 5\' 2"  (1.575 m), weight 163 lb (73.9 kg), SpO2 99 %.    HEENT: No thrush or ulcers. Resp: Lungs clear bilaterally. Cardio: Regular rate and rhythm. GI: Abdomen soft and nontender.  No hepatomegaly. Vascular: No leg edema. Skin: Palms without erythema.  No rash. Port-A-Cath without erythema.   Lab Results:  Lab Results  Component Value Date   WBC 8.4 01/16/2021   HGB 11.8 (L) 01/16/2021   HCT 35.8 (L) 01/16/2021   MCV 98.4 01/16/2021   PLT 179 01/16/2021   NEUTROABS 4.1 01/16/2021    Imaging:  No results found.  Medications: I have reviewed the patient's current medications.  Assessment/Plan: Pancreas cancer-stage IIb (pT2pN1) CT abdomen/pelvis 06/01/2020-2.9 x 3 cm pancreas tail mass, slight increase in size of 7 mm right middle lobe nodule MRI/MRCP 06/22/2020-solid 3.8 x 3 x 3.1 cm pancreas tail mass with focal occlusion of the splenic vein and dilated venous collaterals, no evidence of metastatic disease, bilateral adrenal adenomas CT renal stone study 08/02/2020-scattered airspace opacities throughout the lung bases-new favored to represent infectious/inflammatory changes EUS 08/16/2020-pancreas tail mass, T2N0, FNA biopsy-suspicious for malignancy CTs 08/27/2020-unchanged right middle lobe nodule, superficial left upper back lesion, no change in pancreas tail mass, occluded splenic vein with collaterals at the splenic hilum, no enlarged abdominal  pelvic lymph nodes Distal pancreatectomy/splenectomy 10/17/2020- 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  2/15 lymph nodes positive, positive lymphovascular and perineural invasion, resection margins negative, pT2pN1 Cycle 1 gemcitabine/capecitabine 11/21/2020, day 15 gemcitabine 12/05/2020 Cycle 2 gemcitabine/capecitabine 12/20/2020, day 15 gemcitabine 01/03/2021 Cycle 3 gemcitabine/capecitabine 01/16/2021 2.   Gastric ulcer on EUS 08/16/2020-H. pylori positive 3.   COVID-19 infection January 2022 4.   COPD 5.   Hypertension 6.   Right abdomen/right leg pain-musculoskeletal? 7.   Right middle lobe nodule on CT 06/01/2020, increased from 2017, stable on CT 11/13/2020 8.   History of tobacco use 9.   Superficial left upper back lesion noted on CT 08/27/2020  Disposition: Amanda Arnold appears stable.  She has completed 2 cycles of gemcitabine/capecitabine.  She is tolerating treatment well.  Plan to proceed with cycle 3-day 1 today as scheduled.  CBC and chemistry panel from today reviewed.  Labs adequate to proceed with treatment.  She will return for lab, follow-up, cycle 3-day 15 gemcitabine in 2 weeks.  She will contact the office in the interim with any problems.    Ned Card ANP/GNP-BC   01/16/2021  1:36 PM

## 2021-01-16 NOTE — Patient Instructions (Signed)
[facility]  Discharge Instructions: Thank you for choosing [facility] to provide your oncology and hematology care.   If you have a lab appointment with the [facility], please go directly to the [facility] and check in at the registration area.   Wear comfortable clothing and clothing appropriate for easy access to any Portacath or PICC line.   We strive to give you quality time with your provider. You may need to reschedule your appointment if you arrive late (15 or more minutes).  Arriving late affects you and other patients whose appointments are after yours.  Also, if you miss three or more appointments without notifying the office, you may be dismissed from the clinic at the provider's discretion.      For prescription refill requests, have your pharmacy contact our office and allow 72 hours for refills to be completed.    Today you received the following chemotherapy and/or immunotherapy agents Gemzar      To help prevent nausea and vomiting after your treatment, we encourage you to take your nausea medication as directed.  BELOW ARE SYMPTOMS THAT SHOULD BE REPORTED IMMEDIATELY: *FEVER GREATER THAN 100.4 F (38 C) OR HIGHER *CHILLS OR SWEATING *NAUSEA AND VOMITING THAT IS NOT CONTROLLED WITH YOUR NAUSEA MEDICATION *UNUSUAL SHORTNESS OF BREATH *UNUSUAL BRUISING OR BLEEDING *URINARY PROBLEMS (pain or burning when urinating, or frequent urination) *BOWEL PROBLEMS (unusual diarrhea, constipation, pain near the anus) TENDERNESS IN MOUTH AND THROAT WITH OR WITHOUT PRESENCE OF ULCERS (sore throat, sores in mouth, or a toothache) UNUSUAL RASH, SWELLING OR PAIN  UNUSUAL VAGINAL DISCHARGE OR ITCHING   Items with * indicate a potential emergency and should be followed up as soon as possible or go to the Emergency Department if any problems should occur.  Please show the CHEMOTHERAPY ALERT CARD or IMMUNOTHERAPY ALERT CARD at check-in to the  Emergency Department and triage nurse.  Should you have questions after your visit or need to cancel or reschedule your appointment, please contact [facility]  Dept: [phone]  and follow the prompts.  Office hours are 8:00 a.m. to 4:30 p.m. Monday - Friday. Please note that voicemails left after 4:00 p.m. may not be returned until the following business day.  We are closed weekends and major holidays. You have access to a nurse at all times for urgent questions. Please call the main number to the clinic Dept: [phone] and follow the prompts.   For any non-urgent questions, you may also contact your provider using MyChart. We now offer e-Visits for anyone 79 and older to request care online for non-urgent symptoms. For details visit [email or web address].   Also download the MyChart app! Go to the app store, search "MyChart", open the app, select [facility], and log in with your MyChart username and password.  Due to Covid, a mask is required upon entering the hospital/clinic. If you do not have a mask, one will be given to you upon arrival. For doctor visits, patients may have 1 support person aged 79 or older with them. For treatment visits, patients cannot have anyone with them due to current Covid guidelines and our immunocompromised population.

## 2021-01-16 NOTE — Patient Instructions (Signed)
Implanted Port Home Guide An implanted port is a device that is placed under the skin. It is usually placed in the chest. The device can be used to give IV medicine, to take blood, or for dialysis. You may have an implanted port if: You need IV medicine that would be irritating to the small veins in your hands or arms. You need IV medicines, such as antibiotics, for a long period of time. You need IV nutrition for a long period of time. You need dialysis. When you have a port, your health care provider can choose to use the port instead of veins in your arms for these procedures. You may have fewer limitations when using a port than you would if you used other types of long-term IVs, and you will likely be able to return to normal activities afteryour incision heals. An implanted port has two main parts: Reservoir. The reservoir is the part where a needle is inserted to give medicines or draw blood. The reservoir is round. After it is placed, it appears as a small, raised area under your skin. Catheter. The catheter is a thin, flexible tube that connects the reservoir to a vein. Medicine that is inserted into the reservoir goes into the catheter and then into the vein. How is my port accessed? To access your port: A numbing cream may be placed on the skin over the port site. Your health care provider will put on a mask and sterile gloves. The skin over your port will be cleaned carefully with a germ-killing soap and allowed to dry. Your health care provider will gently pinch the port and insert a needle into it. Your health care provider will check for a blood return to make sure the port is in the vein and is not clogged. If your port needs to remain accessed to get medicine continuously (constant infusion), your health care provider will place a clear bandage (dressing) over the needle site. The dressing and needle will need to be changed every week, or as told by your health care provider. What  is flushing? Flushing helps keep the port from getting clogged. Follow instructions from your health care provider about how and when to flush the port. Ports are usually flushed with saline solution or a medicine called heparin. The need for flushing will depend on how the port is used: If the port is only used from time to time to give medicines or draw blood, the port may need to be flushed: Before and after medicines have been given. Before and after blood has been drawn. As part of routine maintenance. Flushing may be recommended every 4-6 weeks. If a constant infusion is running, the port may not need to be flushed. Throw away any syringes in a disposal container that is meant for sharp items (sharps container). You can buy a sharps container from a pharmacy, or you can make one by using an empty hard plastic bottle with a cover. How long will my port stay implanted? The port can stay in for as long as your health care provider thinks it is needed. When it is time for the port to come out, a surgery will be done to remove it. The surgery will be similar to the procedure that was done to putthe port in. Follow these instructions at home:  Flush your port as told by your health care provider. If you need an infusion over several days, follow instructions from your health care provider about how to take   care of your port site. Make sure you: Wash your hands with soap and water before you change your dressing. If soap and water are not available, use alcohol-based hand sanitizer. Change your dressing as told by your health care provider. Place any used dressings or infusion bags into a plastic bag. Throw that bag in the trash. Keep the dressing that covers the needle clean and dry. Do not get it wet. Do not use scissors or sharp objects near the tube. Keep the tube clamped, unless it is being used. Check your port site every day for signs of infection. Check for: Redness, swelling, or  pain. Fluid or blood. Pus or a bad smell. Protect the skin around the port site. Avoid wearing bra straps that rub or irritate the site. Protect the skin around your port from seat belts. Place a soft pad over your chest if needed. Bathe or shower as told by your health care provider. The site may get wet as long as you are not actively receiving an infusion. Return to your normal activities as told by your health care provider. Ask your health care provider what activities are safe for you. Carry a medical alert card or wear a medical alert bracelet at all times. This will let health care providers know that you have an implanted port in case of an emergency. Get help right away if: You have redness, swelling, or pain at the port site. You have fluid or blood coming from your port site. You have pus or a bad smell coming from the port site. You have a fever. Summary Implanted ports are usually placed in the chest for long-term IV access. Follow instructions from your health care provider about flushing the port and changing bandages (dressings). Take care of the area around your port by avoiding clothing that puts pressure on the area, and by watching for signs of infection. Protect the skin around your port from seat belts. Place a soft pad over your chest if needed. Get help right away if you have a fever or you have redness, swelling, pain, drainage, or a bad smell at the port site. This information is not intended to replace advice given to you by your health care provider. Make sure you discuss any questions you have with your healthcare provider. Document Revised: 11/14/2019 Document Reviewed: 11/14/2019 Elsevier Patient Education  2022 [employer]  

## 2021-01-24 ENCOUNTER — Encounter: Payer: Self-pay | Admitting: Internal Medicine

## 2021-01-24 ENCOUNTER — Other Ambulatory Visit: Payer: Self-pay

## 2021-01-24 ENCOUNTER — Ambulatory Visit (INDEPENDENT_AMBULATORY_CARE_PROVIDER_SITE_OTHER): Payer: Medicare Other | Admitting: Internal Medicine

## 2021-01-24 DIAGNOSIS — G8929 Other chronic pain: Secondary | ICD-10-CM

## 2021-01-24 DIAGNOSIS — E119 Type 2 diabetes mellitus without complications: Secondary | ICD-10-CM

## 2021-01-24 DIAGNOSIS — F4323 Adjustment disorder with mixed anxiety and depressed mood: Secondary | ICD-10-CM

## 2021-01-24 DIAGNOSIS — C252 Malignant neoplasm of tail of pancreas: Secondary | ICD-10-CM

## 2021-01-24 DIAGNOSIS — H6123 Impacted cerumen, bilateral: Secondary | ICD-10-CM

## 2021-01-24 DIAGNOSIS — M5440 Lumbago with sciatica, unspecified side: Secondary | ICD-10-CM | POA: Diagnosis not present

## 2021-01-24 DIAGNOSIS — R1031 Right lower quadrant pain: Secondary | ICD-10-CM

## 2021-01-24 MED ORDER — HYDROCODONE-ACETAMINOPHEN 7.5-325 MG PO TABS: 1.0000 | ORAL_TABLET | Freq: Three times a day (TID) | ORAL | 0 refills | Status: DC | PRN

## 2021-01-24 MED ORDER — METOPROLOL SUCCINATE ER 25 MG PO TB24: ORAL_TABLET | ORAL | 3 refills | Status: DC

## 2021-01-24 MED ORDER — ALPRAZOLAM 0.5 MG PO TABS: ORAL_TABLET | ORAL | 2 refills | Status: DC

## 2021-01-24 NOTE — Progress Notes (Signed)
Subjective:  Patient ID: Amanda Arnold, female    DOB: 1946-03-27  Age: 75 y.o. MRN: [id number]  CC: Follow-up (3 month f/u)   HPI Amanda Arnold presents for pancreatic cancer stage 2b  (11/21)- on chemo now: she continues treatment with gemcitabine and Xeloda. F/u LBP,OA, anxiety  Outpatient Medications Prior to Visit  Medication Sig Dispense Refill   ALPRAZolam (XANAX) 0.5 MG tablet TAKE 1 TABLET(0.5 MG) BY MOUTH TWICE DAILY AS NEEDED FOR ANXIETY (Patient taking differently: Take 0.5 mg by mouth 2 (two) times daily as needed for anxiety.) 60 tablet 1   capecitabine (XELODA) 500 MG tablet Take 3 tablets (1,500 mg total) by mouth 2 (two) times daily after a meal. Take for 21 days, then hold for 7 days. Repeat every 28 days. 126 tablet 0   gabapentin (NEURONTIN) 300 MG capsule Take 1 capsule (300 mg total) by mouth 3 (three) times daily. (Patient taking differently: Take 300 mg by mouth 3 (three) times daily as needed (pain).) 90 capsule 5   HYDROcodone-acetaminophen (NORCO) 7.5-325 MG tablet Take 1 tablet by mouth every 8 (eight) hours as needed for severe pain. 40 tablet 0   ibuprofen (ADVIL) 600 MG tablet Take 600 mg by mouth every 6 (six) hours as needed.     lidocaine-prilocaine (EMLA) cream Apply 1 application topically as directed. Apply to port site 2 hours prior to stick and cover with plastic wrap 30 g 5   metoprolol succinate (TOPROL-XL) 25 MG 24 hr tablet TAKE 1/2 TABLET(12.5 MG) BY MOUTH IN THE MORNING AND AT 1/2 TABLET EVERY EVENING 90 tablet 1   nystatin (MYCOSTATIN/NYSTOP) powder Apply 1 application topically 3 (three) times daily. 15 g 2   ondansetron (ZOFRAN) 8 MG tablet Take 1 tablet (8 mg total) by mouth every 8 (eight) hours as needed for nausea or vomiting. Start 72 hours after IV chemo infusion 30 tablet 1   prochlorperazine (COMPAZINE) 10 MG tablet Take 1 tablet (10 mg total) by mouth every 6 (six) hours as needed for nausea. 60 tablet 1   rosuvastatin (CRESTOR) 10  MG tablet Take 1 tablet (10 mg total) by mouth daily. (Patient taking differently: Take 10 mg by mouth in the morning.) 90 tablet 3   No facility-administered medications prior to visit.    ROS: Review of Systems  Constitutional:  Negative for activity change, appetite change, chills, fatigue and unexpected weight change.  HENT:  Negative for congestion, mouth sores and sinus pressure.   Eyes:  Negative for visual disturbance.  Respiratory:  Negative for cough and chest tightness.   Gastrointestinal:  Negative for abdominal pain and nausea.  Genitourinary:  Negative for difficulty urinating, frequency and vaginal pain.  Musculoskeletal:  Positive for back pain. Negative for gait problem.  Skin:  Negative for pallor and rash.  Neurological:  Negative for dizziness, tremors, weakness, numbness and headaches.  Psychiatric/Behavioral:  Negative for confusion, sleep disturbance and suicidal ideas. The patient is nervous/anxious.    Objective:  BP (!) 142/72 (BP Location: Left Arm)   Pulse 60   Temp 98.5 F (36.9 C) (Oral)   Ht 5\' 2"  (1.575 m)   Wt 163 lb 6.4 oz (74.1 kg)   LMP  (LMP Unknown)   SpO2 97%   BMI 29.89 kg/m   BP Readings from Last 3 Encounters:  01/24/21 (!) 142/72  01/16/21 (!) 127/56  01/03/21 137/62    Wt Readings from Last 3 Encounters:  01/24/21 163 lb 6.4 oz (74.1 kg)  01/16/21 163 lb (73.9 kg)  01/03/21 160 lb 6.4 oz (72.8 kg)    Physical Exam Constitutional:      General: She is not in acute distress.    Appearance: She is well-developed.  HENT:     Head: Normocephalic.     Right Ear: External ear normal. There is impacted cerumen.     Left Ear: Tympanic membrane and external ear normal.     Nose: Nose normal.  Eyes:     General:        Right eye: No discharge.        Left eye: No discharge.     Conjunctiva/sclera: Conjunctivae normal.     Pupils: Pupils are equal, round, and reactive to light.  Neck:     Thyroid: No thyromegaly.     Vascular:  No JVD.     Trachea: No tracheal deviation.  Cardiovascular:     Rate and Rhythm: Normal rate and regular rhythm.     Heart sounds: Normal heart sounds.  Pulmonary:     Effort: No respiratory distress.     Breath sounds: No stridor. No wheezing.  Abdominal:     General: Bowel sounds are normal. There is no distension.     Palpations: Abdomen is soft. There is no mass.     Tenderness: There is no abdominal tenderness. There is no guarding or rebound.  Musculoskeletal:        General: Tenderness present.     Cervical back: Normal range of motion and neck supple. No rigidity.  Lymphadenopathy:     Cervical: No cervical adenopathy.  Skin:    Findings: No erythema or rash.  Neurological:     Cranial Nerves: No cranial nerve deficit.     Motor: No abnormal muscle tone.     Coordination: Coordination normal.     Deep Tendon Reflexes: Reflexes normal.  Psychiatric:        Behavior: Behavior normal.        Thought Content: Thought content normal.        Judgment: Judgment normal.  LS w/pain  L ear - clear R ear - full of wax Lab Results  Component Value Date   WBC 8.4 01/16/2021   HGB 11.8 (L) 01/16/2021   HCT 35.8 (L) 01/16/2021   PLT 179 01/16/2021   GLUCOSE 122 (H) 01/16/2021   CHOL 167 09/17/2017   TRIG 92.0 09/17/2017   HDL 53.00 09/17/2017   LDLCALC 96 09/17/2017   ALT 15 01/16/2021   AST 27 01/16/2021   NA 139 01/16/2021   K 4.1 01/16/2021   CL 104 01/16/2021   CREATININE 0.49 01/16/2021   BUN 19 01/16/2021   CO2 28 01/16/2021   TSH 1.24 02/16/2020   INR 1.1 (H) 08/22/2020   HGBA1C 5.9 (H) 10/17/2020    No results found.  Assessment & Plan:   There are no diagnoses linked to this encounter.   No orders of the defined types were placed in this encounter.    Follow-up: No follow-ups on file.  Walker Kehr, MD

## 2021-01-24 NOTE — Assessment & Plan Note (Signed)
Chronic  Xanax prn  Potential benefits of a long term benzodiazepines  use as well as potential risks  and complications were explained to the patient and were aknowledged. 

## 2021-01-24 NOTE — Assessment & Plan Note (Addendum)
Pancreatic cancer stage 2b  (11/21)- on chemo now: she continues treatment with gemcitabine and Xeloda. F/u w/Dr Benay Spice

## 2021-01-24 NOTE — Assessment & Plan Note (Signed)
Norco prn  Potential benefits of a long term opioids use as well as potential risks (i.e. addiction risk, apnea etc) and complications (i.e. Somnolence, constipation and others) were explained to the patient and were aknowledged. 

## 2021-01-24 NOTE — Assessment & Plan Note (Addendum)
L ear - clear R ear - full of wax [place]igate at home

## 2021-01-24 NOTE — Patient Instructions (Signed)
L ear - clear R ear - full of wax

## 2021-01-26 ENCOUNTER — Other Ambulatory Visit: Payer: Self-pay | Admitting: Oncology

## 2021-01-27 ENCOUNTER — Other Ambulatory Visit: Payer: Self-pay | Admitting: Cardiology

## 2021-01-27 ENCOUNTER — Other Ambulatory Visit: Payer: Self-pay | Admitting: Gastroenterology

## 2021-01-28 ENCOUNTER — Other Ambulatory Visit (HOSPITAL_COMMUNITY): Payer: Self-pay

## 2021-01-28 ENCOUNTER — Other Ambulatory Visit: Payer: Self-pay | Admitting: Oncology

## 2021-01-28 DIAGNOSIS — C252 Malignant neoplasm of tail of pancreas: Secondary | ICD-10-CM

## 2021-01-29 ENCOUNTER — Other Ambulatory Visit (HOSPITAL_COMMUNITY): Payer: Self-pay

## 2021-01-29 ENCOUNTER — Encounter: Payer: Self-pay | Admitting: Oncology

## 2021-01-29 MED ORDER — CAPECITABINE 500 MG PO TABS
1500.0000 mg | ORAL_TABLET | Freq: Two times a day (BID) | ORAL | 0 refills | Status: DC
  Filled 2021-01-29: qty 126, 21d supply, fill #0

## 2021-01-30 ENCOUNTER — Other Ambulatory Visit: Payer: Self-pay

## 2021-01-30 ENCOUNTER — Inpatient Hospital Stay: Payer: Medicare Other

## 2021-01-30 ENCOUNTER — Inpatient Hospital Stay: Payer: Medicare Other | Admitting: Oncology

## 2021-01-30 VITALS — BP 118/45 | HR 62 | Temp 97.7°F | Resp 18 | Wt 162.0 lb

## 2021-01-30 DIAGNOSIS — Z5111 Encounter for antineoplastic chemotherapy: Secondary | ICD-10-CM | POA: Diagnosis not present

## 2021-01-30 DIAGNOSIS — C252 Malignant neoplasm of tail of pancreas: Secondary | ICD-10-CM

## 2021-01-30 LAB — CBC WITH DIFFERENTIAL (CANCER CENTER ONLY)
Abs Immature Granulocytes: 0.03 10*3/uL (ref 0.00–0.07)
Basophils Absolute: 0 10*3/uL (ref 0.0–0.1)
Basophils Relative: 1 %
Eosinophils Absolute: 0.1 10*3/uL (ref 0.0–0.5)
Eosinophils Relative: 1 %
HCT: 33.8 % — ABNORMAL LOW (ref 36.0–46.0)
Hemoglobin: 11.4 g/dL — ABNORMAL LOW (ref 12.0–15.0)
Immature Granulocytes: 0 %
Lymphocytes Relative: 40 %
Lymphs Abs: 3.5 10*3/uL (ref 0.7–4.0)
MCH: 33.7 pg (ref 26.0–34.0)
MCHC: 33.7 g/dL (ref 30.0–36.0)
MCV: 100 fL (ref 80.0–100.0)
Monocytes Absolute: 0.9 10*3/uL (ref 0.1–1.0)
Monocytes Relative: 11 %
Neutro Abs: 4 10*3/uL (ref 1.7–7.7)
Neutrophils Relative %: 47 %
Platelet Count: 226 10*3/uL (ref 150–400)
RBC: 3.38 MIL/uL — ABNORMAL LOW (ref 3.87–5.11)
RDW: 26.4 % — ABNORMAL HIGH (ref 11.5–15.5)
WBC Count: 8.6 10*3/uL (ref 4.0–10.5)
nRBC: 0.2 % (ref 0.0–0.2)

## 2021-01-30 LAB — CMP (CANCER CENTER ONLY)
ALT: 13 U/L (ref 0–44)
AST: 25 U/L (ref 15–41)
Albumin: 4.1 g/dL (ref 3.5–5.0)
Alkaline Phosphatase: 71 U/L (ref 38–126)
Anion gap: 7 (ref 5–15)
BUN: 17 mg/dL (ref 8–23)
CO2: 27 mmol/L (ref 22–32)
Calcium: 9 mg/dL (ref 8.9–10.3)
Chloride: 105 mmol/L (ref 98–111)
Creatinine: 0.49 mg/dL (ref 0.44–1.00)
GFR, Estimated: >60 mL/min (ref >60)
Glucose, Bld: 113 mg/dL — ABNORMAL HIGH (ref 70–99)
Potassium: 4 mmol/L (ref 3.5–5.1)
Sodium: 139 mmol/L (ref 135–145)
Total Bilirubin: 0.9 mg/dL (ref 0.3–1.2)
Total Protein: 6.2 g/dL — ABNORMAL LOW (ref 6.5–8.1)

## 2021-01-30 MED ORDER — PROCHLORPERAZINE MALEATE 10 MG PO TABS
10.0000 mg | ORAL_TABLET | Freq: Once | ORAL | Status: AC
  Administered 2021-01-30: 10 mg via ORAL
  Filled 2021-01-30: qty 1

## 2021-01-30 MED ORDER — SODIUM CHLORIDE 0.9 % IV SOLN
1000.0000 mg/m2 | Freq: Once | INTRAVENOUS | Status: AC
  Administered 2021-01-30: 1786 mg via INTRAVENOUS
  Filled 2021-01-30: qty 46.97

## 2021-01-30 MED ORDER — SODIUM CHLORIDE 0.9 % IV SOLN
Freq: Once | INTRAVENOUS | Status: AC
  Filled 2021-01-30: qty 250

## 2021-01-30 MED ORDER — SODIUM CHLORIDE 0.9% FLUSH
10.0000 mL | INTRAVENOUS | Status: DC | PRN
  Administered 2021-01-30: 10 mL
  Filled 2021-01-30: qty 10

## 2021-01-30 MED ORDER — HEPARIN SOD (PORK) LOCK FLUSH 100 UNIT/ML IV SOLN
500.0000 [IU] | Freq: Once | INTRAVENOUS | Status: AC | PRN
  Administered 2021-01-30: 500 [IU]
  Filled 2021-01-30: qty 5

## 2021-01-30 NOTE — Patient Instructions (Signed)
[facility]  Discharge Instructions: Thank you for choosing [facility] to provide your oncology and hematology care.   If you have a lab appointment with the [facility], please go directly to the [facility] and check in at the registration area.   Wear comfortable clothing and clothing appropriate for easy access to any Portacath or PICC line.   We strive to give you quality time with your provider. You may need to reschedule your appointment if you arrive late (15 or more minutes).  Arriving late affects you and other patients whose appointments are after yours.  Also, if you miss three or more appointments without notifying the office, you may be dismissed from the clinic at the provider's discretion.      For prescription refill requests, have your pharmacy contact our office and allow 72 hours for refills to be completed.    Today you received the following chemotherapy and/or immunotherapy agents gemcitabine  To help prevent nausea and vomiting after your treatment, we encourage you to take your nausea medication as directed.  BELOW ARE SYMPTOMS THAT SHOULD BE REPORTED IMMEDIATELY: *FEVER GREATER THAN 100.4 F (38 C) OR HIGHER *CHILLS OR SWEATING *NAUSEA AND VOMITING THAT IS NOT CONTROLLED WITH YOUR NAUSEA MEDICATION *UNUSUAL SHORTNESS OF BREATH *UNUSUAL BRUISING OR BLEEDING *URINARY PROBLEMS (pain or burning when urinating, or frequent urination) *BOWEL PROBLEMS (unusual diarrhea, constipation, pain near the anus) TENDERNESS IN MOUTH AND THROAT WITH OR WITHOUT PRESENCE OF ULCERS (sore throat, sores in mouth, or a toothache) UNUSUAL RASH, SWELLING OR PAIN  UNUSUAL VAGINAL DISCHARGE OR ITCHING   Items with * indicate a potential emergency and should be followed up as soon as possible or go to the Emergency Department if any problems should occur.  Please show the CHEMOTHERAPY ALERT CARD or IMMUNOTHERAPY ALERT CARD at check-in to the  Emergency Department and triage nurse.  Should you have questions after your visit or need to cancel or reschedule your appointment, please contact [facility]  Dept: [phone]  and follow the prompts.  Office hours are 8:00 a.m. to 4:30 p.m. Monday - Friday. Please note that voicemails left after 4:00 p.m. may not be returned until the following business day.  We are closed weekends and major holidays. You have access to a nurse at all times for urgent questions. Please call the main number to the clinic Dept: [phone] and follow the prompts.   For any non-urgent questions, you may also contact your provider using MyChart. We now offer e-Visits for anyone 35 and older to request care online for non-urgent symptoms. For details visit [email or web address].   Also download the MyChart app! Go to the app store, search "MyChart", open the app, select [facility], and log in with your MyChart username and password.  Due to Covid, a mask is required upon entering the hospital/clinic. If you do not have a mask, one will be given to you upon arrival. For doctor visits, patients may have 1 support person aged 39 or older with them. For treatment visits, patients cannot have anyone with them due to current Covid guidelines and our immunocompromised population.   Gemcitabine injection What is this medication? GEMCITABINE (jem SYE ta been) is a chemotherapy drug. This medicine is used to treat many types of cancer like breast cancer, lung cancer, pancreatic cancer,and ovarian cancer. This medicine may be used for other purposes; ask your health care provider orpharmacist if you have questions. COMMON  BRAND NAME(S): Gemzar, Infugem What should I tell my care team before I take this medication? They need to know if you have any of these conditions: blood disorders infection kidney disease liver disease lung or breathing disease, like asthma recent or ongoing radiation  therapy an unusual or allergic reaction to gemcitabine, other chemotherapy, other medicines, foods, dyes, or preservatives pregnant or trying to get pregnant breast-feeding How should I use this medication? This drug is given as an infusion into a vein. It is administered in a hospitalor clinic by a specially trained health care professional. Talk to your pediatrician regarding the use of this medicine in children.Special care may be needed. Overdosage: If you think you have taken too much of this medicine contact apoison control center or emergency room at once. NOTE: This medicine is only for you. Do not share this medicine with others. What if I miss a dose? It is important not to miss your dose. Call your doctor or health careprofessional if you are unable to keep an appointment. What may interact with this medication? medicines to increase blood counts like filgrastim, pegfilgrastim, sargramostim some other chemotherapy drugs like cisplatin vaccines Talk to your doctor or health care professional before taking any of thesemedicines: acetaminophen aspirin ibuprofen ketoprofen naproxen This list may not describe all possible interactions. Give your health care provider a list of all the medicines, herbs, non-prescription drugs, or dietary supplements you use. Also tell them if you smoke, drink alcohol, or use illegaldrugs. Some items may interact with your medicine. What should I watch for while using this medication? Visit your doctor for checks on your progress. This drug may make you feel generally unwell. This is not uncommon, as chemotherapy can affect healthy cells as well as cancer cells. Report any side effects. Continue your course oftreatment even though you feel ill unless your doctor tells you to stop. In some cases, you may be given additional medicines to help with side effects.Follow all directions for their use. Call your doctor or health care professional for advice if  you get a fever, chills or sore throat, or other symptoms of a cold or flu. Do not treat yourself. This drug decreases your body's ability to fight infections. Try toavoid being around people who are sick. This medicine may increase your risk to bruise or bleed. Call your doctor orhealth care professional if you notice any unusual bleeding. Be careful brushing and flossing your teeth or using a toothpick because you may get an infection or bleed more easily. If you have any dental work done,tell your dentist you are receiving this medicine. Avoid taking products that contain aspirin, acetaminophen, ibuprofen, naproxen, or ketoprofen unless instructed by your doctor. These medicines may hide afever. Do not become pregnant while taking this medicine or for 6 months after stopping it. Women should inform their doctor if they wish to become pregnant or think they might be pregnant. Men should not father a child while taking this medicine and for 3 months after stopping it. There is a potential for serious side effects to an unborn child. Talk to your health care professional or pharmacist for more information. Do not breast-feed an infant while takingthis medicine or for at least 1 week after stopping it. Men should inform their doctors if they wish to father a child. This medicine may lower sperm counts. Talk with your doctor or health care professional ifyou are concerned about your fertility. What side effects may I notice from receiving this medication? Side effects  that you should report to your doctor or health care professionalas soon as possible: allergic reactions like skin rash, itching or hives, swelling of the face, lips, or tongue breathing problems pain, redness, or irritation at site where injected signs and symptoms of a dangerous change in heartbeat or heart rhythm like chest pain; dizziness; fast or irregular heartbeat; palpitations; feeling faint or lightheaded, falls; breathing  problems signs of decreased platelets or bleeding - bruising, pinpoint red spots on the skin, black, tarry stools, blood in the urine signs of decreased red blood cells - unusually weak or tired, feeling faint or lightheaded, falls signs of infection - fever or chills, cough, sore throat, pain or difficulty passing urine signs and symptoms of kidney injury like trouble passing urine or change in the amount of urine signs and symptoms of liver injury like dark yellow or brown urine; general ill feeling or flu-like symptoms; light-colored stools; loss of appetite; nausea; right upper belly pain; unusually weak or tired; yellowing of the eyes or skin swelling of ankles, feet, hands Side effects that usually do not require medical attention (report to yourdoctor or health care professional if they continue or are bothersome): constipation diarrhea hair loss loss of appetite nausea rash vomiting This list may not describe all possible side effects. Call your doctor for medical advice about side effects. You may report side effects to FDA at1-800-FDA-1088. Where should I keep my medication? This drug is given in a hospital or clinic and will not be stored at home. NOTE: This sheet is a summary. It may not cover all possible information. If you have questions about this medicine, talk to your doctor, pharmacist, orhealth care provider.  2022 Elsevier/Gold Standard (2017-09-23 18:06:11)

## 2021-01-30 NOTE — Progress Notes (Signed)
[facility] OFFICE PROGRESS NOTE   Diagnosis: Pancreas cancer  INTERVAL HISTORY:   Amanda Arnold returns as scheduled.  She began another cycle of gemcitabine/capecitabine on 01/16/2021.  No mouth sores, nausea, diarrhea, or hand/foot pain.  The yeast rash in the groin has improved.  She has cerumen in the right ear.  She saw her primary provider.  She completed treatment with an over-the-counter wax remover.  No fever or rash.  Objective:  Vital signs in last 24 hours:  Blood pressure (!) 118/45, pulse 62, temperature 97.7 F (36.5 C), temperature source Temporal, resp. rate 18, weight 162 lb (73.5 kg), SpO2 99 %.    HEENT: No thrush or ulcers, cerumen blocking the right external canal Resp: Lungs clear bilaterally Cardio: Regular rate and rhythm GI: No hepatosplenomegaly Vascular: No leg edem  Skin: Palms without erythema, skin thickening with superficial desquamation at the soles.  No erythema  Portacath/PICC-without erythema  Lab Results:  Lab Results  Component Value Date   WBC 8.6 01/30/2021   HGB 11.4 (L) 01/30/2021   HCT 33.8 (L) 01/30/2021   MCV 100.0 01/30/2021   PLT 226 01/30/2021   NEUTROABS 4.0 01/30/2021    CMP  Lab Results  Component Value Date   NA 139 01/30/2021   K 4.0 01/30/2021   CL 105 01/30/2021   CO2 27 01/30/2021   GLUCOSE 113 (H) 01/30/2021   BUN 17 01/30/2021   CREATININE 0.49 01/30/2021   CALCIUM 9.0 01/30/2021   PROT 6.2 (L) 01/30/2021   ALBUMIN 4.1 01/30/2021   AST 25 01/30/2021   ALT 13 01/30/2021   ALKPHOS 71 01/30/2021   BILITOT 0.9 01/30/2021   GFRNONAA >60 01/30/2021   GFRAA 103 04/20/2020    Lab Results  Component Value Date   OFH219 13 11/21/2020     Medications: I have reviewed the patient's current medications.   Assessment/Plan: Pancreas cancer-stage IIb (pT2pN1) CT abdomen/pelvis 06/01/2020-2.9 x 3 cm pancreas tail mass, slight increase in size of 7 mm right middle lobe nodule MRI/MRCP  06/22/2020-solid 3.8 x 3 x 3.1 cm pancreas tail mass with focal occlusion of the splenic vein and dilated venous collaterals, no evidence of metastatic disease, bilateral adrenal adenomas CT renal stone study 08/02/2020-scattered airspace opacities throughout the lung bases-new favored to represent infectious/inflammatory changes EUS 08/16/2020-pancreas tail mass, T2N0, FNA biopsy-suspicious for malignancy CTs 08/27/2020-unchanged right middle lobe nodule, superficial left upper back lesion, no change in pancreas tail mass, occluded splenic vein with collaterals at the splenic hilum, no enlarged abdominal pelvic lymph nodes Distal pancreatectomy/splenectomy 10/17/2020- 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  2/15 lymph nodes positive, positive lymphovascular and perineural invasion, resection margins negative, pT2pN1 Cycle 1 gemcitabine/capecitabine 11/21/2020, day 15 gemcitabine 12/05/2020 Cycle 2 gemcitabine/capecitabine 12/20/2020, day 15 gemcitabine 01/03/2021 Cycle 3 gemcitabine/capecitabine 01/16/2021, day 15 gemcitabine 01/30/2021 2.   Gastric ulcer on EUS 08/16/2020-H. pylori positive 3.   COVID-19 infection January 2022 4.   COPD 5.   Hypertension 6.   Right abdomen/right leg pain-musculoskeletal? 7.   Right middle lobe nodule on CT 06/01/2020, increased from 2017, stable on CT 11/13/2020 8.   History of tobacco use 9.   Superficial left upper back lesion noted on CT 08/27/2020    Disposition: Amanda Arnold appears stable.  She is tolerating the adjuvant chemotherapy well.  She will complete cycle 3 day 15 gemcitabine today.  She will return for an office visit prior to beginning cycle  4 gemcitabine/capecitabine in 2 weeks.  I recommended she follow-up with her primary provider for management of the earwax.  Betsy Coder, MD  01/30/2021  1:35 PM

## 2021-01-31 ENCOUNTER — Other Ambulatory Visit (HOSPITAL_COMMUNITY): Payer: Self-pay

## 2021-02-06 ENCOUNTER — Encounter: Payer: Self-pay | Admitting: Internal Medicine

## 2021-02-06 ENCOUNTER — Other Ambulatory Visit: Payer: Self-pay

## 2021-02-06 ENCOUNTER — Ambulatory Visit (INDEPENDENT_AMBULATORY_CARE_PROVIDER_SITE_OTHER): Payer: Medicare Other | Admitting: Internal Medicine

## 2021-02-06 DIAGNOSIS — H6123 Impacted cerumen, bilateral: Secondary | ICD-10-CM

## 2021-02-06 NOTE — Progress Notes (Signed)
Subjective:  Patient ID: Amanda Arnold, female    DOB: 02/04/1946  Age: 75 y.o. MRN: [id number]  CC: Cerumen Impaction (Pt states she used the home kit.Marland Kitchen was able to get a little out, but its deep down. Req refill to ENT)   HPI Amanda Arnold presents for R ear discomfort, ear wax B  Outpatient Medications Prior to Visit  Medication Sig Dispense Refill   ALPRAZolam (XANAX) 0.5 MG tablet TAKE 1 TABLET(0.5 MG) BY MOUTH TWICE DAILY AS NEEDED FOR ANXIETY 60 tablet 2   [START ON 02/13/2021] capecitabine (XELODA) 500 MG tablet Take 3 tablets (1,500 mg total) by mouth 2 (two) times daily after a meal. Take for 21 days, then hold for 7 days. Repeat every 28 days. 126 tablet 0   gabapentin (NEURONTIN) 300 MG capsule Take 1 capsule (300 mg total) by mouth 3 (three) times daily. (Patient taking differently: Take 300 mg by mouth 3 (three) times daily as needed (pain).) 90 capsule 5   HYDROcodone-acetaminophen (NORCO) 7.5-325 MG tablet Take 1 tablet by mouth every 8 (eight) hours as needed for severe pain. 60 tablet 0   ibuprofen (ADVIL) 600 MG tablet Take 600 mg by mouth every 6 (six) hours as needed.     lidocaine-prilocaine (EMLA) cream Apply 1 application topically as directed. Apply to port site 2 hours prior to stick and cover with plastic wrap 30 g 5   metoprolol succinate (TOPROL-XL) 25 MG 24 hr tablet TAKE 1/2 TABLET(12.5 MG) BY MOUTH IN THE MORNING AND AT 1/2 TABLET EVERY EVENING 90 tablet 3   nystatin (MYCOSTATIN/NYSTOP) powder Apply 1 application topically 3 (three) times daily. 15 g 2   omeprazole (PRILOSEC) 40 MG capsule TAKE 1 CAPSULE(40 MG) BY MOUTH TWICE DAILY BEFORE A MEAL 60 capsule 6   ondansetron (ZOFRAN) 8 MG tablet Take 1 tablet (8 mg total) by mouth every 8 (eight) hours as needed for nausea or vomiting. Start 72 hours after IV chemo infusion 30 tablet 1   prochlorperazine (COMPAZINE) 10 MG tablet Take 1 tablet (10 mg total) by mouth every 6 (six) hours as needed for nausea. 60  tablet 1   rosuvastatin (CRESTOR) 10 MG tablet TAKE 1 TABLET(10 MG) BY MOUTH DAILY 90 tablet 0   No facility-administered medications prior to visit.    ROS: Review of Systems  HENT:  Positive for ear pain and hearing loss. Negative for ear discharge and facial swelling.    Objective:  BP 122/62 (BP Location: Left Arm)   Pulse (!) 59   Temp 98.6 F (37 C) (Oral)   Ht _0  (1.575 m)   Wt 161 lb 6.4 oz (73.2 kg)   LMP  (LMP Unknown)   SpO2 97%   BMI 29.52 kg/m   BP Readings from Last 3 Encounters:  02/06/21 122/62  01/30/21 (!) 118/45  01/24/21 (!) 142/72    Wt Readings from Last 3 Encounters:  02/06/21 161 lb 6.4 oz (73.2 kg)  01/30/21 162 lb (73.5 kg)  01/24/21 163 lb 6.4 oz (74.1 kg)    Physical Exam Constitutional:      General: She is not in acute distress.    Appearance: She is obese.  Wax R>>L TMs ok after wax removal B     Procedure Note :     Procedure :  Ear irrigation right and left ears, R>L   Indication:  Cerumen impaction right and left ears   Risks, including pain, dizziness, eardrum perforation, bleeding, infection and  others as well as benefits were explained to the patient in detail. Verbal consent was obtained and the patient agreed to proceed.    We used "The Elephant Ear Irrigation Device" filled with lukewarm water for irrigation. A large amount wax was recovered from both ears. Procedure has also required manual wax removal/instrumentation with an ear wax curette and ear forceps on the right and left ears.   Tolerated well. Complications: None.   Postprocedure instructions :  Call if problems.  Lab Results  Component Value Date   WBC 8.6 01/30/2021   HGB 11.4 (L) 01/30/2021   HCT 33.8 (L) 01/30/2021   PLT 226 01/30/2021   GLUCOSE 113 (H) 01/30/2021   CHOL 167 09/17/2017   TRIG 92.0 09/17/2017   HDL 53.00 09/17/2017   LDLCALC 96 09/17/2017   ALT 13 01/30/2021   AST 25 01/30/2021   NA 139 01/30/2021   K 4.0 01/30/2021   CL  105 01/30/2021   CREATININE 0.49 01/30/2021   BUN 17 01/30/2021   CO2 27 01/30/2021   TSH 1.24 02/16/2020   INR 1.1 (H) 08/22/2020   HGBA1C 5.9 (H) 10/17/2020    No results found.  Assessment & Plan:   There are no diagnoses linked to this encounter.   No orders of the defined types were placed in this encounter.    Follow-up: No follow-ups on file.  Walker Kehr, MD

## 2021-02-06 NOTE — Assessment & Plan Note (Signed)
R>>L Options discussed. See procedure

## 2021-02-08 ENCOUNTER — Other Ambulatory Visit: Payer: Self-pay | Admitting: Oncology

## 2021-02-13 ENCOUNTER — Inpatient Hospital Stay: Payer: Medicare Other | Admitting: Oncology

## 2021-02-13 ENCOUNTER — Other Ambulatory Visit: Payer: Self-pay

## 2021-02-13 ENCOUNTER — Inpatient Hospital Stay: Payer: Medicare Other

## 2021-02-13 ENCOUNTER — Inpatient Hospital Stay: Payer: Medicare Other | Attending: Oncology

## 2021-02-13 VITALS — BP 121/53 | HR 67 | Temp 97.8°F | Resp 18 | Ht 62.0 in | Wt 165.8 lb

## 2021-02-13 DIAGNOSIS — I10 Essential (primary) hypertension: Secondary | ICD-10-CM | POA: Insufficient documentation

## 2021-02-13 DIAGNOSIS — Z79899 Other long term (current) drug therapy: Secondary | ICD-10-CM | POA: Insufficient documentation

## 2021-02-13 DIAGNOSIS — C252 Malignant neoplasm of tail of pancreas: Secondary | ICD-10-CM

## 2021-02-13 DIAGNOSIS — K259 Gastric ulcer, unspecified as acute or chronic, without hemorrhage or perforation: Secondary | ICD-10-CM | POA: Diagnosis not present

## 2021-02-13 DIAGNOSIS — J449 Chronic obstructive pulmonary disease, unspecified: Secondary | ICD-10-CM | POA: Insufficient documentation

## 2021-02-13 DIAGNOSIS — Z5111 Encounter for antineoplastic chemotherapy: Secondary | ICD-10-CM | POA: Diagnosis not present

## 2021-02-13 LAB — CBC WITH DIFFERENTIAL (CANCER CENTER ONLY)
Abs Immature Granulocytes: 0.01 10*3/uL (ref 0.00–0.07)
Basophils Absolute: 0 10*3/uL (ref 0.0–0.1)
Basophils Relative: 1 %
Eosinophils Absolute: 0.1 10*3/uL (ref 0.0–0.5)
Eosinophils Relative: 2 %
HCT: 33.1 % — ABNORMAL LOW (ref 36.0–46.0)
Hemoglobin: 11.2 g/dL — ABNORMAL LOW (ref 12.0–15.0)
Immature Granulocytes: 0 %
Lymphocytes Relative: 35 %
Lymphs Abs: 2.2 10*3/uL (ref 0.7–4.0)
MCH: 34.9 pg — ABNORMAL HIGH (ref 26.0–34.0)
MCHC: 33.8 g/dL (ref 30.0–36.0)
MCV: 103.1 fL — ABNORMAL HIGH (ref 80.0–100.0)
Monocytes Absolute: 0.9 10*3/uL (ref 0.1–1.0)
Monocytes Relative: 15 %
Neutro Abs: 3 10*3/uL (ref 1.7–7.7)
Neutrophils Relative %: 47 %
Platelet Count: 171 10*3/uL (ref 150–400)
RBC: 3.21 MIL/uL — ABNORMAL LOW (ref 3.87–5.11)
RDW: 26.4 % — ABNORMAL HIGH (ref 11.5–15.5)
WBC Count: 6.3 10*3/uL (ref 4.0–10.5)
nRBC: 0 % (ref 0.0–0.2)

## 2021-02-13 LAB — CMP (CANCER CENTER ONLY)
ALT: 12 U/L (ref 0–44)
AST: 26 U/L (ref 15–41)
Albumin: 3.7 g/dL (ref 3.5–5.0)
Alkaline Phosphatase: 65 U/L (ref 38–126)
Anion gap: 8 (ref 5–15)
BUN: 12 mg/dL (ref 8–23)
CO2: 27 mmol/L (ref 22–32)
Calcium: 8.7 mg/dL — ABNORMAL LOW (ref 8.9–10.3)
Chloride: 107 mmol/L (ref 98–111)
Creatinine: 0.55 mg/dL (ref 0.44–1.00)
GFR, Estimated: >60 mL/min (ref >60)
Glucose, Bld: 168 mg/dL — ABNORMAL HIGH (ref 70–99)
Potassium: 4.2 mmol/L (ref 3.5–5.1)
Sodium: 142 mmol/L (ref 135–145)
Total Bilirubin: 1 mg/dL (ref 0.3–1.2)
Total Protein: 6.1 g/dL — ABNORMAL LOW (ref 6.5–8.1)

## 2021-02-13 MED ORDER — SODIUM CHLORIDE 0.9% FLUSH
10.0000 mL | INTRAVENOUS | Status: DC | PRN
  Administered 2021-02-13: 10 mL
  Filled 2021-02-13: qty 10

## 2021-02-13 MED ORDER — SODIUM CHLORIDE 0.9 % IV SOLN
1000.0000 mg/m2 | Freq: Once | INTRAVENOUS | Status: AC
  Administered 2021-02-13: 1786 mg via INTRAVENOUS
  Filled 2021-02-13: qty 26.3

## 2021-02-13 MED ORDER — HEPARIN SOD (PORK) LOCK FLUSH 100 UNIT/ML IV SOLN
500.0000 [IU] | Freq: Once | INTRAVENOUS | Status: AC | PRN
  Administered 2021-02-13: 500 [IU]
  Filled 2021-02-13: qty 5

## 2021-02-13 MED ORDER — PROCHLORPERAZINE MALEATE 10 MG PO TABS
10.0000 mg | ORAL_TABLET | Freq: Once | ORAL | Status: AC
  Administered 2021-02-13: 10 mg via ORAL
  Filled 2021-02-13: qty 1

## 2021-02-13 MED ORDER — SODIUM CHLORIDE 0.9 % IV SOLN
Freq: Once | INTRAVENOUS | Status: AC
  Filled 2021-02-13: qty 250

## 2021-02-13 NOTE — Progress Notes (Signed)
Amanda Arnold began another cycle of gemcitabine/capecitabine on 01/16/2021.  She completed day 15 gemcitabine on 01/30/2021.  She reports malaise following the last treatment with gemcitabine.  She had an episode of subxiphoid discomfort lasting for seconds while in the grocery store.  She has intermittent subxiphoid pain after eating.  She is no longer taking omeprazole.  No hand or foot pain.  The yeast rash under the breasts and groin has improved.  Objective:  Vital signs in last 24 hours:  Blood pressure (!) 121/53, pulse 67, temperature 97.8 F (36.6 C), temperature source Oral, resp. rate 18, height '5\' 2"'$  (1.575 m), weight 165 lb 12.8 oz (75.2 kg), SpO2 98 %.    HEENT: No thrush or ulcers Resp: Lungs clear bilaterally Cardio: Regular rate and rhythm GI: No hepatosplenomegaly, mild tenderness in the subxiphoid area, no mass Vascular: No leg edema  Skin: Palms without erythema, dry superficial desquamation and mild erythema at the soles  Portacath/PICC-without erythema  Lab Results:  Lab Results  Component Value Date   WBC 6.3 02/13/2021   HGB 11.2 (L) 02/13/2021   HCT 33.1 (L) 02/13/2021   MCV 103.1 (H) 02/13/2021   PLT 171 02/13/2021   NEUTROABS 3.0 02/13/2021    CMP  Lab Results  Component Value Date   NA 139 01/30/2021   K 4.0 01/30/2021   CL 105 01/30/2021   CO2 27 01/30/2021   GLUCOSE 113 (H) 01/30/2021   BUN 17 01/30/2021   CREATININE 0.49 01/30/2021   CALCIUM 9.0 01/30/2021   PROT 6.2 (L) 01/30/2021   ALBUMIN 4.1 01/30/2021   AST 25 01/30/2021   ALT 13 01/30/2021   ALKPHOS 71 01/30/2021   BILITOT 0.9 01/30/2021   GFRNONAA >60 01/30/2021   GFRAA 103 04/20/2020    Lab Results  Component Value Date   [id number] 13 11/21/2020     Medications: I have reviewed the patient's current medications.   Assessment/Plan: Pancreas cancer-stage IIb (pT2pN1) CT  abdomen/pelvis 06/01/2020-2.9 x 3 cm pancreas tail mass, slight increase in size of 7 mm right middle lobe nodule MRI/MRCP 06/22/2020-solid 3.8 x 3 x 3.1 cm pancreas tail mass with focal occlusion of the splenic vein and dilated venous collaterals, no evidence of metastatic disease, bilateral adrenal adenomas CT renal stone study 08/02/2020-scattered airspace opacities throughout the lung bases-new favored to represent infectious/inflammatory changes EUS 08/16/2020-pancreas tail mass, T2N0, FNA biopsy-suspicious for malignancy CTs 08/27/2020-unchanged right middle lobe nodule, superficial left upper back lesion, no change in pancreas tail mass, occluded splenic vein with collaterals at the splenic hilum, no enlarged abdominal pelvic lymph nodes Distal pancreatectomy/splenectomy 10/17/2020- 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  2/15 lymph nodes positive, positive lymphovascular and perineural invasion, resection margins negative, pT2pN1 Cycle 1 gemcitabine/capecitabine 11/21/2020, day 15 gemcitabine 12/05/2020 Cycle 2 gemcitabine/capecitabine 12/20/2020, day 15 gemcitabine 01/03/2021 Cycle 3 gemcitabine/capecitabine 01/16/2021, day 15 gemcitabine 01/30/2021 Cycle 4 gemcitabine/capecitabine 02/13/2021 2.   Gastric ulcer on EUS 08/16/2020-H. pylori positive 3.   COVID-19 infection January 2022 4.   COPD 5.   Hypertension 6.   Right abdomen/right leg pain-musculoskeletal? 7.   Right middle lobe nodule on CT 06/01/2020, increased from 2017, stable on CT 11/13/2020 8.   History of tobacco use 9.   Superficial left upper back lesion noted on CT 08/27/2020      Disposition: Amanda Arnold appears stable.  She will begin cycle 4 of adjuvant gemcitabine/capecitabine today.  She continues to tolerate treatment well.  She will try over-the-counter antacids for the dyspepsia.  She will return for an office visit and day 15 gemcitabine  in 2 weeks.  Betsy Coder, MD  02/13/2021  9:58 AM

## 2021-02-13 NOTE — Patient Instructions (Signed)
[facility]  Discharge Instructions: Thank you for choosing [facility] to provide your oncology and hematology care.   If you have a lab appointment with the [facility], please go directly to the [facility] and check in at the registration area.   Wear comfortable clothing and clothing appropriate for easy access to any Portacath or PICC line.   We strive to give you quality time with your provider. You may need to reschedule your appointment if you arrive late (15 or more minutes).  Arriving late affects you and other patients whose appointments are after yours.  Also, if you miss three or more appointments without notifying the office, you may be dismissed from the clinic at the provider's discretion.      For prescription refill requests, have your pharmacy contact our office and allow 72 hours for refills to be completed.    Today you received the following chemotherapy and/or immunotherapy agents gemcitabine  To help prevent nausea and vomiting after your treatment, we encourage you to take your nausea medication as directed.  BELOW ARE SYMPTOMS THAT SHOULD BE REPORTED IMMEDIATELY: *FEVER GREATER THAN 100.4 F (38 C) OR HIGHER *CHILLS OR SWEATING *NAUSEA AND VOMITING THAT IS NOT CONTROLLED WITH YOUR NAUSEA MEDICATION *UNUSUAL SHORTNESS OF BREATH *UNUSUAL BRUISING OR BLEEDING *URINARY PROBLEMS (pain or burning when urinating, or frequent urination) *BOWEL PROBLEMS (unusual diarrhea, constipation, pain near the anus) TENDERNESS IN MOUTH AND THROAT WITH OR WITHOUT PRESENCE OF ULCERS (sore throat, sores in mouth, or a toothache) UNUSUAL RASH, SWELLING OR PAIN  UNUSUAL VAGINAL DISCHARGE OR ITCHING   Items with * indicate a potential emergency and should be followed up as soon as possible or go to the Emergency Department if any problems should occur.  Please show the CHEMOTHERAPY ALERT CARD or IMMUNOTHERAPY ALERT CARD at check-in to the  Emergency Department and triage nurse.  Should you have questions after your visit or need to cancel or reschedule your appointment, please contact [facility]  Dept: [phone]  and follow the prompts.  Office hours are 8:00 a.m. to 4:30 p.m. Monday - Friday. Please note that voicemails left after 4:00 p.m. may not be returned until the following business day.  We are closed weekends and major holidays. You have access to a nurse at all times for urgent questions. Please call the main number to the clinic Dept: [phone] and follow the prompts.   For any non-urgent questions, you may also contact your provider using MyChart. We now offer e-Visits for anyone 53 and older to request care online for non-urgent symptoms. For details visit [email or web address].   Also download the MyChart app! Go to the app store, search "MyChart", open the app, select [facility], and log in with your MyChart username and password.  Due to Covid, a mask is required upon entering the hospital/clinic. If you do not have a mask, one will be given to you upon arrival. For doctor visits, patients may have 1 support person aged 69 or older with them. For treatment visits, patients cannot have anyone with them due to current Covid guidelines and our immunocompromised population.   Gemcitabine injection What is this medication? GEMCITABINE (jem SYE ta been) is a chemotherapy drug. This medicine is used to treat many types of cancer like breast cancer, lung cancer, pancreatic cancer,and ovarian cancer. This medicine may be used for other purposes; ask your health care provider orpharmacist if you have questions. COMMON  BRAND NAME(S): Gemzar, Infugem What should I tell my care team before I take this medication? They need to know if you have any of these conditions: blood disorders infection kidney disease liver disease lung or breathing disease, like asthma recent or ongoing radiation  therapy an unusual or allergic reaction to gemcitabine, other chemotherapy, other medicines, foods, dyes, or preservatives pregnant or trying to get pregnant breast-feeding How should I use this medication? This drug is given as an infusion into a vein. It is administered in a hospitalor clinic by a specially trained health care professional. Talk to your pediatrician regarding the use of this medicine in children.Special care may be needed. Overdosage: If you think you have taken too much of this medicine contact apoison control center or emergency room at once. NOTE: This medicine is only for you. Do not share this medicine with others. What if I miss a dose? It is important not to miss your dose. Call your doctor or health careprofessional if you are unable to keep an appointment. What may interact with this medication? medicines to increase blood counts like filgrastim, pegfilgrastim, sargramostim some other chemotherapy drugs like cisplatin vaccines Talk to your doctor or health care professional before taking any of thesemedicines: acetaminophen aspirin ibuprofen ketoprofen naproxen This list may not describe all possible interactions. Give your health care provider a list of all the medicines, herbs, non-prescription drugs, or dietary supplements you use. Also tell them if you smoke, drink alcohol, or use illegaldrugs. Some items may interact with your medicine. What should I watch for while using this medication? Visit your doctor for checks on your progress. This drug may make you feel generally unwell. This is not uncommon, as chemotherapy can affect healthy cells as well as cancer cells. Report any side effects. Continue your course oftreatment even though you feel ill unless your doctor tells you to stop. In some cases, you may be given additional medicines to help with side effects.Follow all directions for their use. Call your doctor or health care professional for advice if  you get a fever, chills or sore throat, or other symptoms of a cold or flu. Do not treat yourself. This drug decreases your body's ability to fight infections. Try toavoid being around people who are sick. This medicine may increase your risk to bruise or bleed. Call your doctor orhealth care professional if you notice any unusual bleeding. Be careful brushing and flossing your teeth or using a toothpick because you may get an infection or bleed more easily. If you have any dental work done,tell your dentist you are receiving this medicine. Avoid taking products that contain aspirin, acetaminophen, ibuprofen, naproxen, or ketoprofen unless instructed by your doctor. These medicines may hide afever. Do not become pregnant while taking this medicine or for 6 months after stopping it. Women should inform their doctor if they wish to become pregnant or think they might be pregnant. Men should not father a child while taking this medicine and for 3 months after stopping it. There is a potential for serious side effects to an unborn child. Talk to your health care professional or pharmacist for more information. Do not breast-feed an infant while takingthis medicine or for at least 1 week after stopping it. Men should inform their doctors if they wish to father a child. This medicine may lower sperm counts. Talk with your doctor or health care professional ifyou are concerned about your fertility. What side effects may I notice from receiving this medication? Side effects  that you should report to your doctor or health care professionalas soon as possible: allergic reactions like skin rash, itching or hives, swelling of the face, lips, or tongue breathing problems pain, redness, or irritation at site where injected signs and symptoms of a dangerous change in heartbeat or heart rhythm like chest pain; dizziness; fast or irregular heartbeat; palpitations; feeling faint or lightheaded, falls; breathing  problems signs of decreased platelets or bleeding - bruising, pinpoint red spots on the skin, black, tarry stools, blood in the urine signs of decreased red blood cells - unusually weak or tired, feeling faint or lightheaded, falls signs of infection - fever or chills, cough, sore throat, pain or difficulty passing urine signs and symptoms of kidney injury like trouble passing urine or change in the amount of urine signs and symptoms of liver injury like dark yellow or brown urine; general ill feeling or flu-like symptoms; light-colored stools; loss of appetite; nausea; right upper belly pain; unusually weak or tired; yellowing of the eyes or skin swelling of ankles, feet, hands Side effects that usually do not require medical attention (report to yourdoctor or health care professional if they continue or are bothersome): constipation diarrhea hair loss loss of appetite nausea rash vomiting This list may not describe all possible side effects. Call your doctor for medical advice about side effects. You may report side effects to FDA at1-800-FDA-1088. Where should I keep my medication? This drug is given in a hospital or clinic and will not be stored at home. NOTE: This sheet is a summary. It may not cover all possible information. If you have questions about this medicine, talk to your doctor, pharmacist, orhealth care provider.  2022 Elsevier/Gold Standard (2017-09-23 18:06:11)

## 2021-02-13 NOTE — Patient Instructions (Signed)
Implanted Port Home Guide An implanted port is a device that is placed under the skin. It is usually placed in the chest. The device can be used to give IV medicine, to take blood, or for dialysis. You may have an implanted port if: You need IV medicine that would be irritating to the small veins in your hands or arms. You need IV medicines, such as antibiotics, for a long period of time. You need IV nutrition for a long period of time. You need dialysis. When you have a port, your health care provider can choose to use the port instead of veins in your arms for these procedures. You may have fewer limitations when using a port than you would if you used other types of long-term IVs, and you will likely be able to return to normal activities afteryour incision heals. An implanted port has two main parts: Reservoir. The reservoir is the part where a needle is inserted to give medicines or draw blood. The reservoir is round. After it is placed, it appears as a small, raised area under your skin. Catheter. The catheter is a thin, flexible tube that connects the reservoir to a vein. Medicine that is inserted into the reservoir goes into the catheter and then into the vein. How is my port accessed? To access your port: A numbing cream may be placed on the skin over the port site. Your health care provider will put on a mask and sterile gloves. The skin over your port will be cleaned carefully with a germ-killing soap and allowed to dry. Your health care provider will gently pinch the port and insert a needle into it. Your health care provider will check for a blood return to make sure the port is in the vein and is not clogged. If your port needs to remain accessed to get medicine continuously (constant infusion), your health care provider will place a clear bandage (dressing) over the needle site. The dressing and needle will need to be changed every week, or as told by your health care provider. What  is flushing? Flushing helps keep the port from getting clogged. Follow instructions from your health care provider about how and when to flush the port. Ports are usually flushed with saline solution or a medicine called heparin. The need for flushing will depend on how the port is used: If the port is only used from time to time to give medicines or draw blood, the port may need to be flushed: Before and after medicines have been given. Before and after blood has been drawn. As part of routine maintenance. Flushing may be recommended every 4-6 weeks. If a constant infusion is running, the port may not need to be flushed. Throw away any syringes in a disposal container that is meant for sharp items (sharps container). You can buy a sharps container from a pharmacy, or you can make one by using an empty hard plastic bottle with a cover. How long will my port stay implanted? The port can stay in for as long as your health care provider thinks it is needed. When it is time for the port to come out, a surgery will be done to remove it. The surgery will be similar to the procedure that was done to putthe port in. Follow these instructions at home:  Flush your port as told by your health care provider. If you need an infusion over several days, follow instructions from your health care provider about how to take   care of your port site. Make sure you: Wash your hands with soap and water before you change your dressing. If soap and water are not available, use alcohol-based hand sanitizer. Change your dressing as told by your health care provider. Place any used dressings or infusion bags into a plastic bag. Throw that bag in the trash. Keep the dressing that covers the needle clean and dry. Do not get it wet. Do not use scissors or sharp objects near the tube. Keep the tube clamped, unless it is being used. Check your port site every day for signs of infection. Check for: Redness, swelling, or  pain. Fluid or blood. Pus or a bad smell. Protect the skin around the port site. Avoid wearing bra straps that rub or irritate the site. Protect the skin around your port from seat belts. Place a soft pad over your chest if needed. Bathe or shower as told by your health care provider. The site may get wet as long as you are not actively receiving an infusion. Return to your normal activities as told by your health care provider. Ask your health care provider what activities are safe for you. Carry a medical alert card or wear a medical alert bracelet at all times. This will let health care providers know that you have an implanted port in case of an emergency. Get help right away if: You have redness, swelling, or pain at the port site. You have fluid or blood coming from your port site. You have pus or a bad smell coming from the port site. You have a fever. Summary Implanted ports are usually placed in the chest for long-term IV access. Follow instructions from your health care provider about flushing the port and changing bandages (dressings). Take care of the area around your port by avoiding clothing that puts pressure on the area, and by watching for signs of infection. Protect the skin around your port from seat belts. Place a soft pad over your chest if needed. Get help right away if you have a fever or you have redness, swelling, pain, drainage, or a bad smell at the port site. This information is not intended to replace advice given to you by your health care provider. Make sure you discuss any questions you have with your healthcare provider. Document Revised: 11/14/2019 Document Reviewed: 11/14/2019 Elsevier Patient Education  2022 [employer]  

## 2021-02-19 ENCOUNTER — Telehealth: Payer: Self-pay | Admitting: Internal Medicine

## 2021-02-19 NOTE — Chronic Care Management (AMB) (Signed)
  Chronic Care Management   Note  02/19/2021 Name: LEIDI ZARRA MRN: [id number] DOB: [birth year]  [name] is a 75 y.o. year old female who is a primary care patient of Plotnikov, Evie Lacks, MD. I reached out to [name] by phone today in response to a referral sent by Ms. Patrick [place]'s PCP, Plotnikov, Evie Lacks, MD.   Ms. Wierman was given information about Chronic Care Management services today including:  CCM service includes personalized support from designated clinical staff supervised by her physician, including individualized plan of care and coordination with other care providers 24/7 contact phone numbers for assistance for urgent and routine care needs. Service will only be billed when office clinical staff spend 20 minutes or more in a month to coordinate care. Only one practitioner may furnish and bill the service in a calendar month. The patient may stop CCM services at any time (effective at the end of the month) by phone call to the office staff.   Patient agreed to services and verbal consent obtained.   Follow up plan:   Lauretta Grill [facility] Scheduler

## 2021-02-19 NOTE — Chronic Care Management (AMB) (Signed)
  Chronic Care Management   Outreach Note  02/19/2021 Name: Amanda Arnold MRN: [id number] DOB: June 11, 1946  Referred by: Cassandria Anger, MD Reason for referral : No chief complaint on file.   An unsuccessful telephone outreach was attempted today. The patient was referred to the pharmacist for assistance with care management and care coordination.   Follow Up Plan:   Lauretta Grill [facility] Scheduler

## 2021-02-23 ENCOUNTER — Other Ambulatory Visit: Payer: Self-pay | Admitting: Oncology

## 2021-02-27 ENCOUNTER — Inpatient Hospital Stay: Payer: Medicare Other

## 2021-02-27 ENCOUNTER — Encounter: Payer: Self-pay | Admitting: Nurse Practitioner

## 2021-02-27 ENCOUNTER — Inpatient Hospital Stay: Payer: Medicare Other | Admitting: Nurse Practitioner

## 2021-02-27 ENCOUNTER — Other Ambulatory Visit: Payer: Self-pay

## 2021-02-27 VITALS — BP 116/51 | HR 64 | Temp 98.1°F | Resp 18 | Ht 62.0 in | Wt 166.4 lb

## 2021-02-27 DIAGNOSIS — Z79899 Other long term (current) drug therapy: Secondary | ICD-10-CM | POA: Diagnosis not present

## 2021-02-27 DIAGNOSIS — J449 Chronic obstructive pulmonary disease, unspecified: Secondary | ICD-10-CM | POA: Diagnosis not present

## 2021-02-27 DIAGNOSIS — C252 Malignant neoplasm of tail of pancreas: Secondary | ICD-10-CM

## 2021-02-27 DIAGNOSIS — Z5111 Encounter for antineoplastic chemotherapy: Secondary | ICD-10-CM | POA: Diagnosis not present

## 2021-02-27 DIAGNOSIS — K259 Gastric ulcer, unspecified as acute or chronic, without hemorrhage or perforation: Secondary | ICD-10-CM | POA: Diagnosis not present

## 2021-02-27 DIAGNOSIS — I10 Essential (primary) hypertension: Secondary | ICD-10-CM | POA: Diagnosis not present

## 2021-02-27 LAB — CMP (CANCER CENTER ONLY)
ALT: 12 U/L (ref 0–44)
AST: 25 U/L (ref 15–41)
Albumin: 3.8 g/dL (ref 3.5–5.0)
Alkaline Phosphatase: 66 U/L (ref 38–126)
Anion gap: 8 (ref 5–15)
BUN: 14 mg/dL (ref 8–23)
CO2: 28 mmol/L (ref 22–32)
Calcium: 9 mg/dL (ref 8.9–10.3)
Chloride: 106 mmol/L (ref 98–111)
Creatinine: 0.48 mg/dL (ref 0.44–1.00)
GFR, Estimated: >60 mL/min (ref >60)
Glucose, Bld: 129 mg/dL — ABNORMAL HIGH (ref 70–99)
Potassium: 3.9 mmol/L (ref 3.5–5.1)
Sodium: 142 mmol/L (ref 135–145)
Total Bilirubin: 0.7 mg/dL (ref 0.3–1.2)
Total Protein: 6 g/dL — ABNORMAL LOW (ref 6.5–8.1)

## 2021-02-27 LAB — CBC WITH DIFFERENTIAL (CANCER CENTER ONLY)
Abs Immature Granulocytes: 0.01 10*3/uL (ref 0.00–0.07)
Basophils Absolute: 0 10*3/uL (ref 0.0–0.1)
Basophils Relative: 1 %
Eosinophils Absolute: 0.1 10*3/uL (ref 0.0–0.5)
Eosinophils Relative: 2 %
HCT: 33 % — ABNORMAL LOW (ref 36.0–46.0)
Hemoglobin: 11.1 g/dL — ABNORMAL LOW (ref 12.0–15.0)
Immature Granulocytes: 0 %
Lymphocytes Relative: 39 %
Lymphs Abs: 2.3 10*3/uL (ref 0.7–4.0)
MCH: 35.9 pg — ABNORMAL HIGH (ref 26.0–34.0)
MCHC: 33.6 g/dL (ref 30.0–36.0)
MCV: 106.8 fL — ABNORMAL HIGH (ref 80.0–100.0)
Monocytes Absolute: 0.9 10*3/uL (ref 0.1–1.0)
Monocytes Relative: 15 %
Neutro Abs: 2.7 10*3/uL (ref 1.7–7.7)
Neutrophils Relative %: 43 %
Platelet Count: 207 10*3/uL (ref 150–400)
RBC: 3.09 MIL/uL — ABNORMAL LOW (ref 3.87–5.11)
RDW: 24.1 % — ABNORMAL HIGH (ref 11.5–15.5)
WBC Count: 5.9 10*3/uL (ref 4.0–10.5)
nRBC: 0.3 % — ABNORMAL HIGH (ref 0.0–0.2)

## 2021-02-27 MED ORDER — SODIUM CHLORIDE 0.9% FLUSH
10.0000 mL | INTRAVENOUS | Status: DC | PRN
  Administered 2021-02-27: 10 mL

## 2021-02-27 MED ORDER — SODIUM CHLORIDE 0.9 % IV SOLN: Freq: Once | INTRAVENOUS | Status: AC

## 2021-02-27 MED ORDER — HEPARIN SOD (PORK) LOCK FLUSH 100 UNIT/ML IV SOLN
500.0000 [IU] | Freq: Once | INTRAVENOUS | Status: AC | PRN
  Administered 2021-02-27: 500 [IU]

## 2021-02-27 MED ORDER — SODIUM CHLORIDE 0.9 % IV SOLN
1000.0000 mg/m2 | Freq: Once | INTRAVENOUS | Status: AC
  Administered 2021-02-27: 1786 mg via INTRAVENOUS
  Filled 2021-02-27: qty 46.97

## 2021-02-27 MED ORDER — PROCHLORPERAZINE MALEATE 10 MG PO TABS
10.0000 mg | ORAL_TABLET | Freq: Once | ORAL | Status: AC
  Administered 2021-02-27: 10 mg via ORAL
  Filled 2021-02-27: qty 1

## 2021-02-27 NOTE — Patient Instructions (Signed)
Implanted Port Home Guide An implanted port is a device that is placed under the skin. It is usually placed in the chest. The device can be used to give IV medicine, to take blood, or for dialysis. You may have an implanted port if: You need IV medicine that would be irritating to the small veins in your hands or arms. You need IV medicines, such as antibiotics, for a long period of time. You need IV nutrition for a long period of time. You need dialysis. When you have a port, your health care provider can choose to use the port instead of veins in your arms for these procedures. You may have fewer limitations when using a port than you would if you used other types of long-term IVs, and you will likely be able to return to normal activities afteryour incision heals. An implanted port has two main parts: Reservoir. The reservoir is the part where a needle is inserted to give medicines or draw blood. The reservoir is round. After it is placed, it appears as a small, raised area under your skin. Catheter. The catheter is a thin, flexible tube that connects the reservoir to a vein. Medicine that is inserted into the reservoir goes into the catheter and then into the vein. How is my port accessed? To access your port: A numbing cream may be placed on the skin over the port site. Your health care provider will put on a mask and sterile gloves. The skin over your port will be cleaned carefully with a germ-killing soap and allowed to dry. Your health care provider will gently pinch the port and insert a needle into it. Your health care provider will check for a blood return to make sure the port is in the vein and is not clogged. If your port needs to remain accessed to get medicine continuously (constant infusion), your health care provider will place a clear bandage (dressing) over the needle site. The dressing and needle will need to be changed every week, or as told by your health care provider. What  is flushing? Flushing helps keep the port from getting clogged. Follow instructions from your health care provider about how and when to flush the port. Ports are usually flushed with saline solution or a medicine called heparin. The need for flushing will depend on how the port is used: If the port is only used from time to time to give medicines or draw blood, the port may need to be flushed: Before and after medicines have been given. Before and after blood has been drawn. As part of routine maintenance. Flushing may be recommended every 4-6 weeks. If a constant infusion is running, the port may not need to be flushed. Throw away any syringes in a disposal container that is meant for sharp items (sharps container). You can buy a sharps container from a pharmacy, or you can make one by using an empty hard plastic bottle with a cover. How long will my port stay implanted? The port can stay in for as long as your health care provider thinks it is needed. When it is time for the port to come out, a surgery will be done to remove it. The surgery will be similar to the procedure that was done to putthe port in. Follow these instructions at home:  Flush your port as told by your health care provider. If you need an infusion over several days, follow instructions from your health care provider about how to take   care of your port site. Make sure you: Wash your hands with soap and water before you change your dressing. If soap and water are not available, use alcohol-based hand sanitizer. Change your dressing as told by your health care provider. Place any used dressings or infusion bags into a plastic bag. Throw that bag in the trash. Keep the dressing that covers the needle clean and dry. Do not get it wet. Do not use scissors or sharp objects near the tube. Keep the tube clamped, unless it is being used. Check your port site every day for signs of infection. Check for: Redness, swelling, or  pain. Fluid or blood. Pus or a bad smell. Protect the skin around the port site. Avoid wearing bra straps that rub or irritate the site. Protect the skin around your port from seat belts. Place a soft pad over your chest if needed. Bathe or shower as told by your health care provider. The site may get wet as long as you are not actively receiving an infusion. Return to your normal activities as told by your health care provider. Ask your health care provider what activities are safe for you. Carry a medical alert card or wear a medical alert bracelet at all times. This will let health care providers know that you have an implanted port in case of an emergency. Get help right away if: You have redness, swelling, or pain at the port site. You have fluid or blood coming from your port site. You have pus or a bad smell coming from the port site. You have a fever. Summary Implanted ports are usually placed in the chest for long-term IV access. Follow instructions from your health care provider about flushing the port and changing bandages (dressings). Take care of the area around your port by avoiding clothing that puts pressure on the area, and by watching for signs of infection. Protect the skin around your port from seat belts. Place a soft pad over your chest if needed. Get help right away if you have a fever or you have redness, swelling, pain, drainage, or a bad smell at the port site. This information is not intended to replace advice given to you by your health care provider. Make sure you discuss any questions you have with your healthcare provider. Document Revised: 11/14/2019 Document Reviewed: 11/14/2019 Elsevier Patient Education  2022 [employer]  

## 2021-02-27 NOTE — Patient Instructions (Signed)
[facility]  Discharge Instructions: Thank you for choosing [facility] to provide your oncology and hematology care.   If you have a lab appointment with the [facility], please go directly to the [facility] and check in at the registration area.   Wear comfortable clothing and clothing appropriate for easy access to any Portacath or PICC line.   We strive to give you quality time with your provider. You may need to reschedule your appointment if you arrive late (15 or more minutes).  Arriving late affects you and other patients whose appointments are after yours.  Also, if you miss three or more appointments without notifying the office, you may be dismissed from the clinic at the provider's discretion.      For prescription refill requests, have your pharmacy contact our office and allow 72 hours for refills to be completed.    Today you received the following chemotherapy and/or immunotherapy agents gemcitabine  To help prevent nausea and vomiting after your treatment, we encourage you to take your nausea medication as directed.  BELOW ARE SYMPTOMS THAT SHOULD BE REPORTED IMMEDIATELY: *FEVER GREATER THAN 100.4 F (38 C) OR HIGHER *CHILLS OR SWEATING *NAUSEA AND VOMITING THAT IS NOT CONTROLLED WITH YOUR NAUSEA MEDICATION *UNUSUAL SHORTNESS OF BREATH *UNUSUAL BRUISING OR BLEEDING *URINARY PROBLEMS (pain or burning when urinating, or frequent urination) *BOWEL PROBLEMS (unusual diarrhea, constipation, pain near the anus) TENDERNESS IN MOUTH AND THROAT WITH OR WITHOUT PRESENCE OF ULCERS (sore throat, sores in mouth, or a toothache) UNUSUAL RASH, SWELLING OR PAIN  UNUSUAL VAGINAL DISCHARGE OR ITCHING   Items with * indicate a potential emergency and should be followed up as soon as possible or go to the Emergency Department if any problems should occur.  Please show the CHEMOTHERAPY ALERT CARD or IMMUNOTHERAPY ALERT CARD at check-in to the  Emergency Department and triage nurse.  Should you have questions after your visit or need to cancel or reschedule your appointment, please contact [facility]  Dept: [phone]  and follow the prompts.  Office hours are 8:00 a.m. to 4:30 p.m. Monday - Friday. Please note that voicemails left after 4:00 p.m. may not be returned until the following business day.  We are closed weekends and major holidays. You have access to a nurse at all times for urgent questions. Please call the main number to the clinic Dept: [phone] and follow the prompts.   For any non-urgent questions, you may also contact your provider using MyChart. We now offer e-Visits for anyone 21 and older to request care online for non-urgent symptoms. For details visit [email or web address].   Also download the MyChart app! Go to the app store, search "MyChart", open the app, select [facility], and log in with your MyChart username and password.  Due to Covid, a mask is required upon entering the hospital/clinic. If you do not have a mask, one will be given to you upon arrival. For doctor visits, patients may have 1 support person aged 16 or older with them. For treatment visits, patients cannot have anyone with them due to current Covid guidelines and our immunocompromised population.   Gemcitabine injection What is this medication? GEMCITABINE (jem SYE ta been) is a chemotherapy drug. This medicine is used to treat many types of cancer like breast cancer, lung cancer, pancreatic cancer,and ovarian cancer. This medicine may be used for other purposes; ask your health care provider orpharmacist if you have questions. COMMON  BRAND NAME(S): Gemzar, Infugem What should I tell my care team before I take this medication? They need to know if you have any of these conditions: blood disorders infection kidney disease liver disease lung or breathing disease, like asthma recent or ongoing radiation  therapy an unusual or allergic reaction to gemcitabine, other chemotherapy, other medicines, foods, dyes, or preservatives pregnant or trying to get pregnant breast-feeding How should I use this medication? This drug is given as an infusion into a vein. It is administered in a hospitalor clinic by a specially trained health care professional. Talk to your pediatrician regarding the use of this medicine in children.Special care may be needed. Overdosage: If you think you have taken too much of this medicine contact apoison control center or emergency room at once. NOTE: This medicine is only for you. Do not share this medicine with others. What if I miss a dose? It is important not to miss your dose. Call your doctor or health careprofessional if you are unable to keep an appointment. What may interact with this medication? medicines to increase blood counts like filgrastim, pegfilgrastim, sargramostim some other chemotherapy drugs like cisplatin vaccines Talk to your doctor or health care professional before taking any of thesemedicines: acetaminophen aspirin ibuprofen ketoprofen naproxen This list may not describe all possible interactions. Give your health care provider a list of all the medicines, herbs, non-prescription drugs, or dietary supplements you use. Also tell them if you smoke, drink alcohol, or use illegaldrugs. Some items may interact with your medicine. What should I watch for while using this medication? Visit your doctor for checks on your progress. This drug may make you feel generally unwell. This is not uncommon, as chemotherapy can affect healthy cells as well as cancer cells. Report any side effects. Continue your course oftreatment even though you feel ill unless your doctor tells you to stop. In some cases, you may be given additional medicines to help with side effects.Follow all directions for their use. Call your doctor or health care professional for advice if  you get a fever, chills or sore throat, or other symptoms of a cold or flu. Do not treat yourself. This drug decreases your body's ability to fight infections. Try toavoid being around people who are sick. This medicine may increase your risk to bruise or bleed. Call your doctor orhealth care professional if you notice any unusual bleeding. Be careful brushing and flossing your teeth or using a toothpick because you may get an infection or bleed more easily. If you have any dental work done,tell your dentist you are receiving this medicine. Avoid taking products that contain aspirin, acetaminophen, ibuprofen, naproxen, or ketoprofen unless instructed by your doctor. These medicines may hide afever. Do not become pregnant while taking this medicine or for 6 months after stopping it. Women should inform their doctor if they wish to become pregnant or think they might be pregnant. Men should not father a child while taking this medicine and for 3 months after stopping it. There is a potential for serious side effects to an unborn child. Talk to your health care professional or pharmacist for more information. Do not breast-feed an infant while takingthis medicine or for at least 1 week after stopping it. Men should inform their doctors if they wish to father a child. This medicine may lower sperm counts. Talk with your doctor or health care professional ifyou are concerned about your fertility. What side effects may I notice from receiving this medication? Side effects  that you should report to your doctor or health care professionalas soon as possible: allergic reactions like skin rash, itching or hives, swelling of the face, lips, or tongue breathing problems pain, redness, or irritation at site where injected signs and symptoms of a dangerous change in heartbeat or heart rhythm like chest pain; dizziness; fast or irregular heartbeat; palpitations; feeling faint or lightheaded, falls; breathing  problems signs of decreased platelets or bleeding - bruising, pinpoint red spots on the skin, black, tarry stools, blood in the urine signs of decreased red blood cells - unusually weak or tired, feeling faint or lightheaded, falls signs of infection - fever or chills, cough, sore throat, pain or difficulty passing urine signs and symptoms of kidney injury like trouble passing urine or change in the amount of urine signs and symptoms of liver injury like dark yellow or brown urine; general ill feeling or flu-like symptoms; light-colored stools; loss of appetite; nausea; right upper belly pain; unusually weak or tired; yellowing of the eyes or skin swelling of ankles, feet, hands Side effects that usually do not require medical attention (report to yourdoctor or health care professional if they continue or are bothersome): constipation diarrhea hair loss loss of appetite nausea rash vomiting This list may not describe all possible side effects. Call your doctor for medical advice about side effects. You may report side effects to FDA at1-800-FDA-1088. Where should I keep my medication? This drug is given in a hospital or clinic and will not be stored at home. NOTE: This sheet is a summary. It may not cover all possible information. If you have questions about this medicine, talk to your doctor, pharmacist, orhealth care provider.  2022 Elsevier/Gold Standard (2017-09-23 18:06:11)

## 2021-02-27 NOTE — Progress Notes (Signed)
  [facility] OFFICE PROGRESS NOTE   Diagnosis: Pancreas cancer  INTERVAL HISTORY:   Amanda Arnold returns as scheduled.  She began another cycle of gemcitabine/capecitabine 02/13/2021.  She denies nausea/vomiting.  No mouth sores.  No diarrhea.  No hand or foot pain or redness.  She notes some peeling on the feet.  She is applying lotion.  She denies fever, cough, shortness of breath.  Objective:  Vital signs in last 24 hours:  Blood pressure (!) 116/51, pulse 64, temperature 98.1 F (36.7 C), resp. rate 18, height '5\' 2"'$  (1.575 m), weight 166 lb 6.4 oz (75.5 kg), SpO2 98 %.    HEENT: No thrush or ulcers. Resp: Lungs clear bilaterally. Cardio: Regular rate and rhythm. GI: Abdomen soft and nontender.  No hepatomegaly. Vascular: No leg edema.  Calves soft and nontender. Skin: Palms with hyperpigmentation, skin thickening.  Soles are dry appearing. Port-A-Cath without erythema.   Lab Results:  Lab Results  Component Value Date   WBC 5.9 02/27/2021   HGB 11.1 (L) 02/27/2021   HCT 33.0 (L) 02/27/2021   MCV 106.8 (H) 02/27/2021   PLT 207 02/27/2021   NEUTROABS 2.7 02/27/2021    Imaging:  No results found.  Medications: I have reviewed the patient's current medications.  Assessment/Plan: Pancreas cancer-stage IIb (pT2pN1) CT abdomen/pelvis 06/01/2020-2.9 x 3 cm pancreas tail mass, slight increase in size of 7 mm right middle lobe nodule MRI/MRCP 06/22/2020-solid 3.8 x 3 x 3.1 cm pancreas tail mass with focal occlusion of the splenic vein and dilated venous collaterals, no evidence of metastatic disease, bilateral adrenal adenomas CT renal stone study 08/02/2020-scattered airspace opacities throughout the lung bases-new favored to represent infectious/inflammatory changes EUS 08/16/2020-pancreas tail mass, T2N0, FNA biopsy-suspicious for malignancy CTs 08/27/2020-unchanged right middle lobe nodule, superficial left upper back lesion, no change in pancreas tail mass,  occluded splenic vein with collaterals at the splenic hilum, no enlarged abdominal pelvic lymph nodes Distal pancreatectomy/splenectomy 10/17/2020- 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  2/15 lymph nodes positive, positive lymphovascular and perineural invasion, resection margins negative, pT2pN1 Cycle 1 gemcitabine/capecitabine 11/21/2020, day 15 gemcitabine 12/05/2020 Cycle 2 gemcitabine/capecitabine 12/20/2020, day 15 gemcitabine 01/03/2021 Cycle 3 gemcitabine/capecitabine 01/16/2021, day 15 gemcitabine 01/30/2021 Cycle 4 gemcitabine/capecitabine 02/13/2021, day 15 gemcitabine 02/27/2021 2.   Gastric ulcer on EUS 08/16/2020-H. pylori positive 3.   COVID-19 infection January 2022 4.   COPD 5.   Hypertension 6.   Right abdomen/right leg pain-musculoskeletal? 7.   Right middle lobe nodule on CT 06/01/2020, increased from 2017, stable on CT 11/13/2020 8.   History of tobacco use 9.   Superficial left upper back lesion noted on CT 08/27/2020    Disposition: Amanda Arnold appears stable.  She is completing cycle 4 gemcitabine/capecitabine.  She continues to tolerate treatment well.  Plan to proceed with cycle 4-day 15 gemcitabine today as scheduled.  We reviewed the CBC and chemistry panel from today.  Labs adequate to proceed with treatment.  She will return for lab, follow-up, cycle 5 gemcitabine/capecitabine in 2 weeks.  She will contact the office in the interim with any problems.    Ned Card ANP/GNP-BC   02/27/2021  9:45 AM

## 2021-03-04 ENCOUNTER — Other Ambulatory Visit (HOSPITAL_COMMUNITY): Payer: Self-pay

## 2021-03-04 ENCOUNTER — Other Ambulatory Visit: Payer: Self-pay | Admitting: Oncology

## 2021-03-04 DIAGNOSIS — C252 Malignant neoplasm of tail of pancreas: Secondary | ICD-10-CM

## 2021-03-05 ENCOUNTER — Other Ambulatory Visit (HOSPITAL_COMMUNITY): Payer: Self-pay

## 2021-03-05 MED ORDER — CAPECITABINE 500 MG PO TABS
1500.0000 mg | ORAL_TABLET | Freq: Two times a day (BID) | ORAL | 0 refills | Status: DC
  Filled 2021-03-05: qty 126, 21d supply, fill #0

## 2021-03-09 ENCOUNTER — Other Ambulatory Visit: Payer: Self-pay | Admitting: Oncology

## 2021-03-13 ENCOUNTER — Inpatient Hospital Stay: Payer: Medicare Other | Admitting: Oncology

## 2021-03-13 ENCOUNTER — Inpatient Hospital Stay: Payer: Medicare Other

## 2021-03-13 ENCOUNTER — Other Ambulatory Visit: Payer: Self-pay

## 2021-03-13 VITALS — BP 141/62 | HR 62 | Temp 97.8°F | Resp 18 | Ht 62.0 in | Wt 168.6 lb

## 2021-03-13 DIAGNOSIS — Z5111 Encounter for antineoplastic chemotherapy: Secondary | ICD-10-CM | POA: Diagnosis not present

## 2021-03-13 DIAGNOSIS — C252 Malignant neoplasm of tail of pancreas: Secondary | ICD-10-CM | POA: Diagnosis not present

## 2021-03-13 DIAGNOSIS — J449 Chronic obstructive pulmonary disease, unspecified: Secondary | ICD-10-CM | POA: Diagnosis not present

## 2021-03-13 DIAGNOSIS — I10 Essential (primary) hypertension: Secondary | ICD-10-CM | POA: Diagnosis not present

## 2021-03-13 DIAGNOSIS — K259 Gastric ulcer, unspecified as acute or chronic, without hemorrhage or perforation: Secondary | ICD-10-CM | POA: Diagnosis not present

## 2021-03-13 DIAGNOSIS — Z79899 Other long term (current) drug therapy: Secondary | ICD-10-CM | POA: Diagnosis not present

## 2021-03-13 LAB — CBC WITH DIFFERENTIAL (CANCER CENTER ONLY)
Abs Immature Granulocytes: 0.01 10*3/uL (ref 0.00–0.07)
Basophils Absolute: 0.1 10*3/uL (ref 0.0–0.1)
Basophils Relative: 1 %
Eosinophils Absolute: 0.1 10*3/uL (ref 0.0–0.5)
Eosinophils Relative: 2 %
HCT: 34.5 % — ABNORMAL LOW (ref 36.0–46.0)
Hemoglobin: 11.6 g/dL — ABNORMAL LOW (ref 12.0–15.0)
Immature Granulocytes: 0 %
Lymphocytes Relative: 36 %
Lymphs Abs: 2.5 10*3/uL (ref 0.7–4.0)
MCH: 37.5 pg — ABNORMAL HIGH (ref 26.0–34.0)
MCHC: 33.6 g/dL (ref 30.0–36.0)
MCV: 111.7 fL — ABNORMAL HIGH (ref 80.0–100.0)
Monocytes Absolute: 1.1 10*3/uL — ABNORMAL HIGH (ref 0.1–1.0)
Monocytes Relative: 16 %
Neutro Abs: 3.1 10*3/uL (ref 1.7–7.7)
Neutrophils Relative %: 45 %
Platelet Count: 206 10*3/uL (ref 150–400)
RBC: 3.09 MIL/uL — ABNORMAL LOW (ref 3.87–5.11)
RDW: 20.8 % — ABNORMAL HIGH (ref 11.5–15.5)
WBC Count: 6.9 10*3/uL (ref 4.0–10.5)
nRBC: 0.3 % — ABNORMAL HIGH (ref 0.0–0.2)

## 2021-03-13 LAB — CMP (CANCER CENTER ONLY)
ALT: 11 U/L (ref 0–44)
AST: 23 U/L (ref 15–41)
Albumin: 3.9 g/dL (ref 3.5–5.0)
Alkaline Phosphatase: 66 U/L (ref 38–126)
Anion gap: 8 (ref 5–15)
BUN: 14 mg/dL (ref 8–23)
CO2: 27 mmol/L (ref 22–32)
Calcium: 8.9 mg/dL (ref 8.9–10.3)
Chloride: 104 mmol/L (ref 98–111)
Creatinine: 0.49 mg/dL (ref 0.44–1.00)
GFR, Estimated: >60 mL/min (ref >60)
Glucose, Bld: 109 mg/dL — ABNORMAL HIGH (ref 70–99)
Potassium: 4.1 mmol/L (ref 3.5–5.1)
Sodium: 139 mmol/L (ref 135–145)
Total Bilirubin: 0.6 mg/dL (ref 0.3–1.2)
Total Protein: 6.2 g/dL — ABNORMAL LOW (ref 6.5–8.1)

## 2021-03-13 MED ORDER — SODIUM CHLORIDE 0.9 % IV SOLN
1000.0000 mg/m2 | Freq: Once | INTRAVENOUS | Status: AC
  Administered 2021-03-13: 1786 mg via INTRAVENOUS
  Filled 2021-03-13: qty 26.3

## 2021-03-13 MED ORDER — PROCHLORPERAZINE MALEATE 10 MG PO TABS
10.0000 mg | ORAL_TABLET | Freq: Once | ORAL | Status: AC
  Administered 2021-03-13: 10 mg via ORAL
  Filled 2021-03-13: qty 1

## 2021-03-13 MED ORDER — SODIUM CHLORIDE 0.9% FLUSH
10.0000 mL | INTRAVENOUS | Status: DC | PRN
  Administered 2021-03-13: 10 mL

## 2021-03-13 MED ORDER — SODIUM CHLORIDE 0.9 % IV SOLN: Freq: Once | INTRAVENOUS | Status: AC

## 2021-03-13 MED ORDER — HEPARIN SOD (PORK) LOCK FLUSH 100 UNIT/ML IV SOLN
500.0000 [IU] | Freq: Once | INTRAVENOUS | Status: AC | PRN
  Administered 2021-03-13: 500 [IU]

## 2021-03-13 MED ORDER — CAPECITABINE 500 MG PO TABS: ORAL_TABLET | ORAL | Status: DC

## 2021-03-13 NOTE — Progress Notes (Signed)
[facility] OFFICE PROGRESS NOTE   Diagnosis: Pancreas cancer  INTERVAL HISTORY:   Ms. Cerra returns as scheduled.  She completed another treatment with gemcitabine on 02/27/2021.  No fever or rash.  She is due to begin another cycle of capecitabine today.  She reports discomfort at the soles with ambulation.  Objective:  Vital signs in last 24 hours:  Blood pressure (!) 141/62, pulse 62, temperature 97.8 F (36.6 C), temperature source Oral, resp. rate 18, height '5\' 2"'$  (1.575 m), weight 168 lb 9.6 oz (76.5 kg), SpO2 98 %.    HEENT: No thrush or ulcers Resp: Lungs clear bilaterally Cardio: Regular rate and rhythm GI: No hepatosplenomegaly Vascular: No leg edema  Skin: Palms without erythema.  Skin thickening, dry desquamation, and erythema at the soles  Portacath/PICC-without erythema  Lab Results:  Lab Results  Component Value Date   WBC 6.9 03/13/2021   HGB 11.6 (L) 03/13/2021   HCT 34.5 (L) 03/13/2021   MCV 111.7 (H) 03/13/2021   PLT 206 03/13/2021   NEUTROABS 3.1 03/13/2021    CMP  Lab Results  Component Value Date   NA 139 03/13/2021   K 4.1 03/13/2021   CL 104 03/13/2021   CO2 27 03/13/2021   GLUCOSE 109 (H) 03/13/2021   BUN 14 03/13/2021   CREATININE 0.49 03/13/2021   CALCIUM 8.9 03/13/2021   PROT 6.2 (L) 03/13/2021   ALBUMIN 3.9 03/13/2021   AST 23 03/13/2021   ALT 11 03/13/2021   ALKPHOS 66 03/13/2021   BILITOT 0.6 03/13/2021   GFRNONAA >60 03/13/2021   GFRAA 103 04/20/2020    Lab Results  Component Value Date   [id number] 11/21/2020    Medications: I have reviewed the patient's current medications.   Assessment/Plan:  Pancreas cancer-stage IIb (pT2pN1) CT abdomen/pelvis 06/01/2020-2.9 x 3 cm pancreas tail mass, slight increase in size of 7 mm right middle lobe nodule MRI/MRCP 06/22/2020-solid 3.8 x 3 x 3.1 cm pancreas tail mass with focal occlusion of the splenic vein and dilated venous collaterals, no evidence of  metastatic disease, bilateral adrenal adenomas CT renal stone study 08/02/2020-scattered airspace opacities throughout the lung bases-new favored to represent infectious/inflammatory changes EUS 08/16/2020-pancreas tail mass, T2N0, FNA biopsy-suspicious for malignancy CTs 08/27/2020-unchanged right middle lobe nodule, superficial left upper back lesion, no change in pancreas tail mass, occluded splenic vein with collaterals at the splenic hilum, no enlarged abdominal pelvic lymph nodes Distal pancreatectomy/splenectomy 10/17/2020- 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  2/15 lymph nodes positive, positive lymphovascular and perineural invasion, resection margins negative, pT2pN1 Cycle 1 gemcitabine/capecitabine 11/21/2020, day 15 gemcitabine 12/05/2020 Cycle 2 gemcitabine/capecitabine 12/20/2020, day 15 gemcitabine 01/03/2021 Cycle 3 gemcitabine/capecitabine 01/16/2021, day 15 gemcitabine 01/30/2021 Cycle 4 gemcitabine/capecitabine 02/13/2021, day 15 gemcitabine 02/27/2021 Cycle 5 gemcitabine/capecitabine 03/13/2021 (capecitabine dose reduced secondary to hand/foot syndrome) 2.   Gastric ulcer on EUS 08/16/2020-H. pylori positive 3.   COVID-19 infection January 2022 4.   COPD 5.   Hypertension 6.   Right abdomen/right leg pain-musculoskeletal? 7.   Right middle lobe nodule on CT 06/01/2020, increased from 2017, stable on CT 11/13/2020 8.   History of tobacco use 9.   Superficial left upper back lesion noted on CT 08/27/2020     Disposition: Ms. Maini has completed 4 cycles of gemcitabine/capecitabine.  She will begin cycle 5 today.  She has developed superficial desquamation, erythema, and pain at the feet.  The capecitabine will be dose reduced  to 1000 mg twice daily with this cycle.  She will complete another treatment with gemcitabine today.  Ms. Harberts will return for an office visit and gemcitabine in 2 weeks.  Betsy Coder, MD  03/13/2021  12:42 PM

## 2021-03-13 NOTE — Patient Instructions (Signed)
[facility]  Discharge Instructions: Thank you for choosing [facility] to provide your oncology and hematology care.   If you have a lab appointment with the [facility], please go directly to the [facility] and check in at the registration area.   Wear comfortable clothing and clothing appropriate for easy access to any Portacath or PICC line.   We strive to give you quality time with your provider. You may need to reschedule your appointment if you arrive late (15 or more minutes).  Arriving late affects you and other patients whose appointments are after yours.  Also, if you miss three or more appointments without notifying the office, you may be dismissed from the clinic at the provider's discretion.      For prescription refill requests, have your pharmacy contact our office and allow 72 hours for refills to be completed.    Today you received the following chemotherapy and/or immunotherapy agents gemzar   To help prevent nausea and vomiting after your treatment, we encourage you to take your nausea medication as directed.  BELOW ARE SYMPTOMS THAT SHOULD BE REPORTED IMMEDIATELY: *FEVER GREATER THAN 100.4 F (38 C) OR HIGHER *CHILLS OR SWEATING *NAUSEA AND VOMITING THAT IS NOT CONTROLLED WITH YOUR NAUSEA MEDICATION *UNUSUAL SHORTNESS OF BREATH *UNUSUAL BRUISING OR BLEEDING *URINARY PROBLEMS (pain or burning when urinating, or frequent urination) *BOWEL PROBLEMS (unusual diarrhea, constipation, pain near the anus) TENDERNESS IN MOUTH AND THROAT WITH OR WITHOUT PRESENCE OF ULCERS (sore throat, sores in mouth, or a toothache) UNUSUAL RASH, SWELLING OR PAIN  UNUSUAL VAGINAL DISCHARGE OR ITCHING   Items with * indicate a potential emergency and should be followed up as soon as possible or go to the Emergency Department if any problems should occur.  Please show the CHEMOTHERAPY ALERT CARD or IMMUNOTHERAPY ALERT CARD at check-in to the  Emergency Department and triage nurse.  Should you have questions after your visit or need to cancel or reschedule your appointment, please contact [facility]  Dept: [phone]  and follow the prompts.  Office hours are 8:00 a.m. to 4:30 p.m. Monday - Friday. Please note that voicemails left after 4:00 p.m. may not be returned until the following business day.  We are closed weekends and major holidays. You have access to a nurse at all times for urgent questions. Please call the main number to the clinic Dept: [phone] and follow the prompts.   For any non-urgent questions, you may also contact your provider using MyChart. We now offer e-Visits for anyone 57 and older to request care online for non-urgent symptoms. For details visit [email or web address].   Also download the MyChart app! Go to the app store, search "MyChart", open the app, select [facility], and log in with your MyChart username and password.  Due to Covid, a mask is required upon entering the hospital/clinic. If you do not have a mask, one will be given to you upon arrival. For doctor visits, patients may have 1 support person aged 25 or older with them. For treatment visits, patients cannot have anyone with them due to current Covid guidelines and our immunocompromised population.   Gemcitabine injection What is this medication? GEMCITABINE (jem SYE ta been) is a chemotherapy drug. This medicine is used to treat many types of cancer like breast cancer, lung cancer, pancreatic cancer, and ovarian cancer. This medicine may be used for other purposes; ask your health care provider or pharmacist if you  have questions. COMMON BRAND NAME(S): Gemzar, Infugem What should I tell my care team before I take this medication? They need to know if you have any of these conditions: blood disorders infection kidney disease liver disease lung or breathing disease, like asthma recent or ongoing radiation  therapy an unusual or allergic reaction to gemcitabine, other chemotherapy, other medicines, foods, dyes, or preservatives pregnant or trying to get pregnant breast-feeding How should I use this medication? This drug is given as an infusion into a vein. It is administered in a hospital or clinic by a specially trained health care professional. Talk to your pediatrician regarding the use of this medicine in children. Special care may be needed. Overdosage: If you think you have taken too much of this medicine contact a poison control center or emergency room at once. NOTE: This medicine is only for you. Do not share this medicine with others. What if I miss a dose? It is important not to miss your dose. Call your doctor or health care professional if you are unable to keep an appointment. What may interact with this medication? medicines to increase blood counts like filgrastim, pegfilgrastim, sargramostim some other chemotherapy drugs like cisplatin vaccines Talk to your doctor or health care professional before taking any of these medicines: acetaminophen aspirin ibuprofen ketoprofen naproxen This list may not describe all possible interactions. Give your health care provider a list of all the medicines, herbs, non-prescription drugs, or dietary supplements you use. Also tell them if you smoke, drink alcohol, or use illegal drugs. Some items may interact with your medicine. What should I watch for while using this medication? Visit your doctor for checks on your progress. This drug may make you feel generally unwell. This is not uncommon, as chemotherapy can affect healthy cells as well as cancer cells. Report any side effects. Continue your course of treatment even though you feel ill unless your doctor tells you to stop. In some cases, you may be given additional medicines to help with side effects. Follow all directions for their use. Call your doctor or health care professional for  advice if you get a fever, chills or sore throat, or other symptoms of a cold or flu. Do not treat yourself. This drug decreases your body's ability to fight infections. Try to avoid being around people who are sick. This medicine may increase your risk to bruise or bleed. Call your doctor or health care professional if you notice any unusual bleeding. Be careful brushing and flossing your teeth or using a toothpick because you may get an infection or bleed more easily. If you have any dental work done, tell your dentist you are receiving this medicine. Avoid taking products that contain aspirin, acetaminophen, ibuprofen, naproxen, or ketoprofen unless instructed by your doctor. These medicines may hide a fever. Do not become pregnant while taking this medicine or for 6 months after stopping it. Women should inform their doctor if they wish to become pregnant or think they might be pregnant. Men should not father a child while taking this medicine and for 3 months after stopping it. There is a potential for serious side effects to an unborn child. Talk to your health care professional or pharmacist for more information. Do not breast-feed an infant while taking this medicine or for at least 1 week after stopping it. Men should inform their doctors if they wish to father a child. This medicine may lower sperm counts. Talk with your doctor or health care professional if you  are concerned about your fertility. What side effects may I notice from receiving this medication? Side effects that you should report to your doctor or health care professional as soon as possible: allergic reactions like skin rash, itching or hives, swelling of the face, lips, or tongue breathing problems pain, redness, or irritation at site where injected signs and symptoms of a dangerous change in heartbeat or heart rhythm like chest pain; dizziness; fast or irregular heartbeat; palpitations; feeling faint or lightheaded, falls;  breathing problems signs of decreased platelets or bleeding - bruising, pinpoint red spots on the skin, black, tarry stools, blood in the urine signs of decreased red blood cells - unusually weak or tired, feeling faint or lightheaded, falls signs of infection - fever or chills, cough, sore throat, pain or difficulty passing urine signs and symptoms of kidney injury like trouble passing urine or change in the amount of urine signs and symptoms of liver injury like dark yellow or brown urine; general ill feeling or flu-like symptoms; light-colored stools; loss of appetite; nausea; right upper belly pain; unusually weak or tired; yellowing of the eyes or skin swelling of ankles, feet, hands Side effects that usually do not require medical attention (report to your doctor or health care professional if they continue or are bothersome): constipation diarrhea hair loss loss of appetite nausea rash vomiting This list may not describe all possible side effects. Call your doctor for medical advice about side effects. You may report side effects to FDA at 1-800-FDA-1088. Where should I keep my medication? This drug is given in a hospital or clinic and will not be stored at home. NOTE: This sheet is a summary. It may not cover all possible information. If you have questions about this medicine, talk to your doctor, pharmacist, or health care provider.  2022 Elsevier/Gold Standard (2017-09-23 18:06:11)

## 2021-03-19 ENCOUNTER — Other Ambulatory Visit (HOSPITAL_COMMUNITY): Payer: Self-pay

## 2021-03-19 ENCOUNTER — Other Ambulatory Visit: Payer: Self-pay | Admitting: Oncology

## 2021-03-19 DIAGNOSIS — C252 Malignant neoplasm of tail of pancreas: Secondary | ICD-10-CM

## 2021-03-20 ENCOUNTER — Other Ambulatory Visit (HOSPITAL_COMMUNITY): Payer: Self-pay

## 2021-03-20 ENCOUNTER — Encounter: Payer: Self-pay | Admitting: Oncology

## 2021-03-21 ENCOUNTER — Telehealth: Payer: Self-pay

## 2021-03-21 NOTE — Progress Notes (Signed)
Chronic Care Management Pharmacy Assistant   Name: Amanda Arnold  MRN: [id number] DOB: [birth year]  Amanda Arnold is an 75 y.o. year old female who presents for his initial CCM visit with the clinical pharmacist.   Recent office visits:  02/06/21 Cassandria Anger MD - Seen for bilateral impacted cerumen - No medication changes - No follow up noted 01/24/21 Evie Lacks Plotnikov MD - Seen for cancer of the pancreas -  Xanax 0.5 and Norco 7.5-325 ordered PRN - No follow up noted 12/07/20 Biagio Borg MD - Seen for bilateral impacted cerumen - No medication changes noted - No follow up noted 10/29/20 Evie Lacks Plotnikov MD - Seen for Right lower quadrant pain - Labs ordered - start Prandin 1 mg  - No follow up noted  10/03/20 Evie Lacks Plotnikov MD - Seen for Right lower quadrant pain - No medication changes noted - No follow up noted     Recent consult visits:  03/13/21 Ladell Pier MD - Oncology - Seen for cancer of the pancreas -  Treatment with gemcitabine - No medication changes - Follow up in 2 weeks  02/27/21 Owens Shark NP - Hematology and Oncology - Seen for cancer of the pancreas -  Treatment with gemcitabine - No medication changes - Follow up in 2 weeks  02/13/21 Ladell Pier MD - Oncology - Seen for cancer of the pancreas -  Treatment with gemcitabine - No medication changes - Labs ordered - Follow up in 2 weeks  01/30/21 Ladell Pier MD - Oncology - Seen for cancer of the pancreas -  Treatment with gemcitabine - No medication changes -  Follow up in 2 weeks  01/16/21 Owens Shark NP - Hematology and Oncology - Seen for cancer of the pancreas -  Labs ordered - Treatment with gemcitabine - No medication changes noted - Follow up in 2 weeks  01/03/21 Ladell Pier MD - Oncology - Seen for cancer of the pancreas -  Treatment with gemcitabine and Xeloda - No medication changes noted - Labs ordered - Follow up in 2 weeks  12/19/20 Owens Shark NP - Hematology and Oncology - Seen  for cancer of the pancreas -  Labs ordered - Treatment with gemcitabine - No medication changes noted - Follow up in 2 weeks  12/05/20 Owens Shark NP - Hematology and Oncology - Seen for cancer of the pancreas - Treatment with gemcitabine, and continues Xeloda - No other medication changes - Follow up in 2 weeks  12/04/20 [name] - Oncology - Seen for genetic testing for cancer of pancreas - No medication changes noted - No follow up noted 11/30/20 - [facility] [place] - Oncology - No medication changes noted -  Follow up 12/14/20 11/22/20 [place] for genetic testing for cancer of pancreas consult - No medication changes noted - No follow up noted 11/21/20 Ladell Pier MD - Oncology - Seen for Yeast dermatitis - Started Xeloda, Diflucan, and nystatin powder. -  per provider note: begin cycle 1 gemcitabine/capecitabine today - follow up in 2 weeks  11/08/20 Ladell Pier MD - Oncology - Seen for cancer of the pancreas - Labs ordered - Start on capecitabine 1500 mg a.m., take 1500 mg p.m. - No follow up noted    Hospital visits:  Medication Reconciliation was completed by comparing discharge summary, patient's EMR and Pharmacy list, and  upon discussion with patient.  Admitted to the hospital on 12/21/20 due to Acute swimmers ear. Discharge date was 12/21/20. Discharged from [facility].   New?Medications Started at [facility] Discharge:?? -started Cortisporin daily for the next 5-7 days.  Medication Changes at Hospital Discharge: N/a  Medications Discontinued at Hospital Discharge: N/a  Medications that remain the same after Hospital Discharge:??  -All other medications will remain the same.    Medications: Outpatient Encounter Medications as of 03/21/2021  Medication Sig   ALPRAZolam (XANAX) 0.5 MG tablet TAKE 1 TABLET(0.5 MG) BY MOUTH TWICE DAILY AS NEEDED FOR ANXIETY   capecitabine (XELODA) 500 MG tablet Take 2 tablets (1000 mg) by  mouth 2 times daily after a meal. Take for 21 days, then hold for 7 days. Repeat every 28 days.   gabapentin (NEURONTIN) 300 MG capsule Take 1 capsule (300 mg total) by mouth 3 (three) times daily. (Patient taking differently: Take 300 mg by mouth 3 (three) times daily as needed (pain).)   HYDROcodone-acetaminophen (NORCO) 7.5-325 MG tablet Take 1 tablet by mouth every 8 (eight) hours as needed for severe pain.   ibuprofen (ADVIL) 600 MG tablet Take 600 mg by mouth every 6 (six) hours as needed.   lidocaine-prilocaine (EMLA) cream Apply 1 application topically as directed. Apply to port site 2 hours prior to stick and cover with plastic wrap   metoprolol succinate (TOPROL-XL) 25 MG 24 hr tablet TAKE 1/2 TABLET(12.5 MG) BY MOUTH IN THE MORNING AND AT 1/2 TABLET EVERY EVENING   nystatin (MYCOSTATIN/NYSTOP) powder Apply 1 application topically 3 (three) times daily.   omeprazole (PRILOSEC) 40 MG capsule TAKE 1 CAPSULE(40 MG) BY MOUTH TWICE DAILY BEFORE A MEAL   ondansetron (ZOFRAN) 8 MG tablet Take 1 tablet (8 mg total) by mouth every 8 (eight) hours as needed for nausea or vomiting. Start 72 hours after IV chemo infusion   prochlorperazine (COMPAZINE) 10 MG tablet Take 1 tablet (10 mg total) by mouth every 6 (six) hours as needed for nausea.   rosuvastatin (CRESTOR) 10 MG tablet TAKE 1 TABLET(10 MG) BY MOUTH DAILY   No facility-administered encounter medications on file as of 03/21/2021.   ALPRAZolam (XANAX) 0.5 MG tablet - Last filled  01/24/21 60 tablets gabapentin (NEURONTIN) 300 MG capsule - Last filled 09/04/2020 30 DS HYDROcodone-acetaminophen (NORCO) 7.5-325 MG tablet - Last filled 01/24/2021 60 tablets ibuprofen (ADVIL) 600 MG tablet - Last filled 11/13/20 unknown DS lidocaine-prilocaine (EMLA) cream - Last filled 11/14/2020 metoprolol succinate (TOPROL-XL) 25 MG 24 hr tablet - Last filled 01/24/21 90 DS nystatin (MYCOSTATIN/NYSTOP) powder - Last filled 11/21/2020 omeprazole (PRILOSEC) 40 MG  capsule - Last filled 01/28/21 30 DS ondansetron (ZOFRAN) 8 MG tablet - Last filled 11/14/20 30 tablets prochlorperazine (COMPAZINE) 10 MG tablet - Last filled 11/14/20 60 tablets rosuvastatin (CRESTOR) 10 MG tablet - Last filled 01/29/21 90 DS   Star Rating Drugs: rosuvastatin (CRESTOR) 10 MG tablet - Last filled 01/29/21 90 DS   Andee Poles, CMA

## 2021-03-23 ENCOUNTER — Other Ambulatory Visit: Payer: Self-pay | Admitting: Oncology

## 2021-03-25 ENCOUNTER — Ambulatory Visit (INDEPENDENT_AMBULATORY_CARE_PROVIDER_SITE_OTHER): Payer: Medicare Other

## 2021-03-25 ENCOUNTER — Other Ambulatory Visit: Payer: Self-pay

## 2021-03-25 DIAGNOSIS — I2510 Atherosclerotic heart disease of native coronary artery without angina pectoris: Secondary | ICD-10-CM

## 2021-03-25 DIAGNOSIS — F411 Generalized anxiety disorder: Secondary | ICD-10-CM

## 2021-03-25 DIAGNOSIS — M791 Myalgia, unspecified site: Secondary | ICD-10-CM

## 2021-03-25 DIAGNOSIS — E785 Hyperlipidemia, unspecified: Secondary | ICD-10-CM

## 2021-03-25 DIAGNOSIS — I10 Essential (primary) hypertension: Secondary | ICD-10-CM

## 2021-03-25 DIAGNOSIS — M1990 Unspecified osteoarthritis, unspecified site: Secondary | ICD-10-CM

## 2021-03-25 NOTE — Progress Notes (Addendum)
Chronic Care Management Pharmacy Note  03/25/2021 Name:  Amanda Arnold MRN:  [id number] DOB:  11/28/1945  Summary: -Patient notes that she feels she is doing well at this time, reports that anxiety/ mood is complicated at times due to recent loss of her son, taking 1/2 tablet of alprazolam nightly, at most takes an additional 1/2 tablet daily - declined referral to counseling  -Patient reports that she is not currently taking gabapentin due to the medication making her feel sick, notes that pain levels are controlled by periodic use of ibuprofen  and vicodin as needed  -denies current issues/side effects of medications that she is currently taking -Patient is cognizant of diet and effects that diet can have on blood pressure, cholesterol, and acid reflux/ stomach ulcers  Recommendations/Changes made from today's visit: -Recommending for patient to discontinue use of ibuprofen in setting of stomach ulcer, advised for use of voltaren gel 2g up to 4 times daily as patient finds relief from NSAID use for pain in her right leg and feet -Patient to have updated lipid panel with next PCP appointment, adjust rosuvastatin dose if needed  - Latest A1c under control at 5.9% - will continue to monitor and add medication if necessary  Subjective: Amanda Arnold is an 75 y.o. year old female who is a primary patient of Plotnikov, Evie Lacks, MD.  The CCM team was consulted for assistance with disease management and care coordination needs.    Engaged with patient by telephone for initial visit in response to provider referral for pharmacy case management and/or care coordination services.   Consent to Services:  The patient was given the following information about Chronic Care Management services today, agreed to services, and gave verbal consent: 1. CCM service includes personalized support from designated clinical staff supervised by the primary care provider, including individualized plan of care  and coordination with other care providers 2. 24/7 contact phone numbers for assistance for urgent and routine care needs. 3. Service will only be billed when office clinical staff spend 20 minutes or more in a month to coordinate care. 4. Only one practitioner may furnish and bill the service in a calendar month. 5.The patient may stop CCM services at any time (effective at the end of the month) by phone call to the office staff. 6. The patient will be responsible for cost sharing (co-pay) of up to 20% of the service fee (after annual deductible is met). Patient agreed to services and consent obtained.  Patient Care Team: Plotnikov, Evie Lacks, MD as PCP - General Marlou Porch Thana Farr, MD as PCP - Cardiology (Cardiology) Erline Levine, MD as Consulting Physician (Neurosurgery) Jerline Pain, MD as Consulting Physician (Cardiology) Ladell Pier, MD as Consulting Physician (Oncology) [name], MD as Consulting Physician (General Surgery) Tomasa Blase, [facility] as Pharmacist (Pharmacist)  Recent office visits:  02/06/21 Cassandria Anger MD - Seen for bilateral impacted cerumen - No medication changes - No follow up noted 01/24/21 Evie Lacks Plotnikov MD - Seen for cancer of the pancreas -  Xanax 0.5 and Norco 7.5-325 ordered PRN - No follow up noted 12/07/20 Biagio Borg MD - Seen for bilateral impacted cerumen - No medication changes noted - No follow up noted 10/29/20 Evie Lacks Plotnikov MD - Seen for Right lower quadrant pain - Labs ordered - start Prandin 1 mg  - No follow up noted  10/03/20 Evie Lacks Plotnikov MD - Seen for Right lower quadrant pain - No  medication changes noted - No follow up noted    Recent consult visits:  03/13/21 Ladell Pier MD - Oncology - Seen for cancer of the pancreas -  Treatment with gemcitabine - due for another cycle of capecitabine - reduce dosage to 105m twice daily - noted that patient has developed superficial desquamation, erythema, and pain at the feet -  Follow up in 2 weeks  02/27/21 LOwens SharkNP - Hematology and Oncology - Seen for cancer of the pancreas -  Treatment with gemcitabine - No medication changes - Follow up in 2 weeks  02/13/21 GLadell PierMD - Oncology - Seen for cancer of the pancreas -  Treatment with gemcitabine - No medication changes - Labs ordered - Follow up in 2 weeks  01/30/21 GLadell PierMD - Oncology - Seen for cancer of the pancreas -  Treatment with gemcitabine - No medication changes -  Follow up in 2 weeks  01/16/21 LOwens SharkNP - Hematology and Oncology - Seen for cancer of the pancreas -  Labs ordered - Treatment with gemcitabine - No medication changes noted - Follow up in 2 weeks  01/03/21 GLadell PierMD - Oncology - Seen for cancer of the pancreas -  Treatment with gemcitabine and Xeloda - No medication changes noted - Labs ordered - Follow up in 2 weeks  12/19/20 LOwens SharkNP - Hematology and Oncology - Seen for cancer of the pancreas -  Labs ordered - Treatment with gemcitabine - No medication changes noted - Follow up in 2 weeks  12/05/20 LOwens SharkNP - Hematology and Oncology - Seen for cancer of the pancreas - Treatment with gemcitabine, and continues Xeloda - No other medication changes - Follow up in 2 weeks  12/04/20 BRaynelle Chary- Oncology - Seen for genetic testing for cancer of pancreas - No medication changes noted - No follow up noted 11/30/20 - JThoreau- Oncology - No medication changes noted -  Follow up 12/14/20 11/22/20 [place] testing for cancer of pancreas consult - No medication changes noted - No follow up noted 11/21/20 GLadell PierMD - Oncology - Seen for Yeast dermatitis - Started Xeloda, Diflucan, and nystatin powder. -  per provider note: begin cycle 1 gemcitabine/capecitabine today - follow up in 2 weeks  11/08/20 GLadell PierMD - Oncology - Seen for cancer of the pancreas - Labs ordered - Start on  capecitabine 1500 mg a.m., take 1500 mg p.m. 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  Objective:  Lab Results  Component Value Date   CREATININE 0.49 03/13/2021   BUN 14 03/13/2021   GFR 87.58 10/29/2020   GFRNONAA >60 03/13/2021   GFRAA 103 04/20/2020   NA 139 03/13/2021   K 4.1 03/13/2021   CALCIUM 8.9 03/13/2021   CO2 27 03/13/2021   GLUCOSE 109 (H) 03/13/2021    Lab Results  Component Value Date/Time   HGBA1C 5.9 (H) 10/17/2020 07:00 AM   HGBA1C 6.7 11/04/2019 12:00 AM   HGBA1C 6.2 07/27/2019 10:34 AM   GFR 87.58 10/29/2020 10:45 AM   GFR 88.02 08/22/2020 08:46 AM    Last  diabetic Eye exam:  Lab Results  Component Value Date/Time   HMDIABEYEEXA No Retinopathy 10/23/2019 12:00 AM    Last diabetic Foot exam:  No results found for: HMDIABFOOTEX   Lab Results  Component Value Date   CHOL 167 09/17/2017   HDL 53.00 09/17/2017   LDLCALC 96 09/17/2017   TRIG 92.0 09/17/2017   CHOLHDL 3 09/17/2017    Hepatic Function Latest Ref Rng & Units 03/13/2021 02/27/2021 02/13/2021  Total Protein 6.5 - 8.1 g/dL 6.2(L) 6.0(L) 6.1(L)  Albumin 3.5 - 5.0 g/dL 3.9 3.8 3.7  AST 15 - 41 U/L _0 ALT 0 - 44 U/L _1 Alk Phosphatase 38 - 126 U/L 66 66 65  Total Bilirubin 0.3 - 1.2 mg/dL 0.6 0.7 1.0  Bilirubin, Direct 0.0 - 0.3 mg/dL - - -    Lab Results  Component Value Date/Time   TSH 1.24 02/16/2020 08:57 AM   TSH 1.50 09/17/2017 09:52 AM   FREET4 1.2 02/16/2020 08:57 AM    CBC Latest Ref Rng & Units 03/13/2021 02/27/2021 02/13/2021  WBC 4.0 - 10.5 K/uL 6.9 5.9 6.3  Hemoglobin 12.0 - 15.0 g/dL 11.6(L) 11.1(L) 11.2(L)  Hematocrit 36.0 - 46.0 % 34.5(L) 33.0(L) 33.1(L)  Platelets 150 - 400 K/uL 206 207 171    Lab Results   Component Value Date/Time   VD25OH 34 02/25/2010 05:47 PM    Clinical ASCVD: No  The ASCVD Risk score (Arnett DK, et al., 2019) failed to calculate for the following reasons:   Cannot find a previous HDL lab   Cannot find a previous total cholesterol lab    Depression screen [facility] 2/9 12/12/2020 11/01/2019 10/28/2018  Decreased Interest 1 0 1  Down, Depressed, Hopeless 1 0 1  PHQ - 2 Score 2 0 2  Altered sleeping - - 3  Tired, decreased energy - - 1  Change in appetite - - 1  Feeling bad or failure about yourself  - - 0  Trouble concentrating - - 0  Moving slowly or fidgety/restless - - 0  Suicidal thoughts - - 0  PHQ-9 Score - - 7  Difficult doing work/chores - - Not difficult at all  Some recent data might be hidden    Social History   Tobacco Use  Smoking Status Former   Types: Cigarettes   Quit date: 07/10/2013   Years since quitting: 7.7  Smokeless Tobacco Current   Types: Snuff   BP Readings from Last 3 Encounters:  03/13/21 (!) 141/62  02/27/21 (!) 116/51  02/13/21 (!) 121/53   Pulse Readings from Last 3 Encounters:  03/13/21 62  02/27/21 64  02/13/21 67   Wt Readings from Last 3 Encounters:  03/13/21 168 lb 9.6 oz (76.5 kg)  02/27/21 166 lb 6.4 oz (75.5 kg)  02/13/21 165 lb 12.8 oz (75.2 kg)   BMI Readings from Last 3 Encounters:  03/13/21 30.84 kg/m  02/27/21 30.43 kg/m  02/13/21 30.33 kg/m    Assessment/Interventions: Review of patient past medical history, allergies, medications, health status, including review of consultants reports, laboratory and other test data, was performed as part of comprehensive evaluation and provision of chronic care management services.   SDOH:  (Social Determinants of Health) assessments and interventions performed: Yes  SDOH Screenings   Alcohol Screen: Low Risk    Last Alcohol Screening Score (AUDIT): 0  Depression (PHQ2-9): Low Risk    PHQ-2 Score: 2  Financial Resource Strain: Low Risk    Difficulty of  Paying Living Expenses: Not hard at all  Food Insecurity: No Food Insecurity   Worried About [place] in the Last Year: Never true   Ran Out of Food in the Last Year: Never true  Housing: Low Risk    Last Housing Risk Score: 0  Physical Activity: Sufficiently Active   Days of Exercise per Week: 5 days   Minutes of Exercise per Session: 30 min  Social Connections: Socially Integrated   Frequency of Communication with Friends and Family: More than three times a week   Frequency of Social Gatherings with Friends and Family: Twice a week   Attends Religious Services: 1 to 4 times per year   Active Member of [employer] or Organizations: No   Attends Music therapist: 1 to 4 times per year   Marital Status: Married  Stress: No Stress Concern Present   Feeling of Stress : Not at all  Tobacco Use: High Risk   Smoking Tobacco Use: Former   Smokeless Tobacco Use: Current  Transportation Needs: No Data processing manager (Medical): No   Lack of Transportation (Non-Medical): No    CCM Care Plan  Allergies  Allergen Reactions   Morphine And Related Shortness Of Breath   Bupropion Hcl Other (See Comments)    "does not feel right"   Ezetimibe Other (See Comments)    REACTION: legs burning   Metformin And Related Diarrhea    Diarrhea w/XR or regular    Naproxen     Upset stomach, diarrhea   Trazodone Hcl Swelling   Tramadol Hcl Palpitations    REACTION: palpitations    Medications Reviewed Today     Reviewed by Tomasa Blase, [facility] (Pharmacist) on 03/25/21 at 1102  Med List Status: <None>   Medication Order Taking? Sig Documenting Provider Last Dose Status Informant  ALPRAZolam (XANAX) 0.5 MG tablet [id number] Yes TAKE 1 TABLET(0.5 MG) BY MOUTH TWICE DAILY AS NEEDED FOR ANXIETY  Patient taking differently: Take 0.25 mg by mouth at bedtime as needed. TAKE 1 TABLET(0.5 MG) BY MOUTH TWICE DAILY AS NEEDED FOR ANXIETY   Plotnikov, Evie Lacks, MD  Taking Active   capecitabine (XELODA) 500 MG tablet [id number] Yes Take 2 tablets (1000 mg) by mouth 2 times daily after a meal. Take for 21 days, then hold for 7 days. Repeat every 28 days. Ladell Pier, MD Taking Active   diclofenac Sodium (VOLTAREN) 1 % GEL [id number] Yes Apply 2 g topically 4 (four) times daily as needed. [provider]  Active   gabapentin (NEURONTIN) 300 MG capsule [id number] No Take 1 capsule (300 mg total) by mouth 3 (three) times daily.  Patient not taking: Reported on 03/25/2021   Plotnikov, Evie Lacks, MD Not Taking Active   HYDROcodone-acetaminophen [facility]) 7.5-325 MG tablet [id number] Yes Take 1 tablet by mouth every 8 (eight) hours as needed for severe pain. Plotnikov, Evie Lacks, MD Taking Active   lidocaine-prilocaine (EMLA) cream [id number] No Apply 1 application topically as directed. Apply to port site 2 hours prior to stick and cover with plastic wrap  Patient not taking: Reported on 03/25/2021   Ladell Pier, MD Not Taking Active   metoprolol succinate (TOPROL-XL) 25 MG 24 hr tablet [id number] Yes TAKE 1/2 TABLET(12.5 MG) BY MOUTH IN THE MORNING AND AT 1/2 TABLET EVERY EVENING Plotnikov, Evie Lacks, MD Taking Active   omeprazole (PRILOSEC) 40 MG capsule [id number] No TAKE 1 CAPSULE(40 MG) BY MOUTH TWICE DAILY BEFORE A  MEAL  Patient not taking: Reported on 03/25/2021   Mansouraty, Telford Nab., MD Not Taking Active   ondansetron [facility]) 8 MG tablet [id number] No Take 1 tablet (8 mg total) by mouth every 8 (eight) hours as needed for nausea or vomiting. Start 72 hours after IV chemo infusion  Patient not taking: Reported on 03/25/2021   Ladell Pier, MD Not Taking Active   prochlorperazine (COMPAZINE) 10 MG tablet [id number] No Take 1 tablet (10 mg total) by mouth every 6 (six) hours as needed for nausea.  Patient not taking: Reported on 03/25/2021   Ladell Pier, MD Not Taking Active   rosuvastatin (CRESTOR) 10 MG tablet [id number] Yes TAKE 1 TABLET(10  MG) BY MOUTH DAILY Jerline Pain, MD Taking Active   Med List Note Darl Pikes, RPH-CPP 01/04/21 1540): Xeloda filled at [place]            Patient Active Problem List   Diagnosis Date Noted   Genetic testing 12/04/2020   Family history of breast cancer 11/26/2020   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   Malnutrition of moderate degree 10/19/2020   Weight loss 09/04/2020   Cancer of pancreas, tail ([place]) 08/28/2020   Shoulder pain, right 07/10/2020   Pancreatic mass 06/26/2020   Coronary atherosclerosis 05/28/2020   Pulmonary nodule 05/28/2020   Palpitations 02/16/2020   Dysuria 07/27/2019   Piriformis syndrome 03/29/2019   Humerus fracture 01/27/2019   Closed fracture of left proximal humerus 12/07/2018   Trochanteric bursitis of right hip 07/12/2018   Trochanteric bursitis of both hips 03/23/2018   Neck pain 02/11/2018   Peroneal mononeuropathy, left 01/26/2017   Skin cyst 09/26/2016   Adjustment disorder with mixed anxiety and depressed mood 06/27/2016   Fatigue 03/28/2016   Restless leg syndrome 03/28/2016   Right lower quadrant abdominal pain 08/15/2015   Well adult exam 07/03/2015   Meralgia paraesthetica 05/29/2015   Paresthesia 05/15/2015   Sciatic leg pain 09/22/2014   Rash and nonspecific skin eruption 09/11/2014   Right lower quadrant pain 09/11/2014   Nonspecific abnormal electrocardiogram (ECG) (EKG) 07/27/2014   Precordial pain 07/27/2014   Cerumen impaction 06/16/2014   Chest pain, atypical 03/03/2014   UTI (urinary tract infection) 03/29/2013   Herpes zoster 03/02/2013   Abscess of left leg 02/02/2013   Leg pain, bilateral 12/09/2012   Left groin pain 09/09/2012   Unspecified sinusitis (chronic) 08/25/2012   Chest wall pain 05/20/2012   Nocturia 12/24/2011   Psychosomatic pain 10/14/2011   Left shoulder pain 08/22/2011   Vertigo 06/13/2011   Syncopal vertigo 06/13/2011   Intertriginous candidiasis 03/07/2011   Uncontrolled type 2  diabetes with renal manifestation ([place]) 03/07/2011   HIP PAIN 06/17/2010   HYPERGLYCEMIA 05/15/2010   CENTRAL SLEEP APNEA CONDS CLASSIFIED ELSEWHERE 05/14/2010   Obstructive sleep apnea 04/17/2010   Obesity 04/10/2010   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   DYSPNEA 03/15/2010   TRIGGER FINGER 12/18/2009   Abdominal pain, epigastric 12/18/2009   KNEE PAIN 07/11/2009   NEURALGIA, 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/2009   COLD SORE 05/11/2009   T M J 05/11/2009   BREAST PAIN 04/13/2009   Edema 03/01/2009   CERVICAL LYMPHADENOPATHY 03/01/2009   PRURITUS 07/28/2008   TACHYCARDIA 07/28/2008   INSOMNIA, PERSISTENT 02/22/2008   Cystitis 02/22/2008   SWEATING 01/18/2008   BRONCHITIS, ACUTE 12/08/2007   Chronic fatigue 12/08/2007   Headache(784.0) 12/08/2007   Cough 12/08/2007   ARTHRALGIA 11/19/2007   LOW BACK PAIN 01/27/2007   Tobacco use disorder 11/28/2006   Generalized  anxiety disorder 05/25/2006   Essential hypertension 05/25/2006   COPD exacerbation ([place]) 05/25/2006   Osteoarthritis 05/25/2006   Polyuria 05/25/2006   Dyslipidemia 04/30/2006   Myalgia 04/02/2006   Anxiety and depression 01/02/2006   Disturbance of skin sensation 05/29/2005   ESOTROPIA, LEFT EYE 08[birth year]26[birth year]47    Immunization History  Administered Date(s) Administered   HiB (PRP-OMP) 09/04/2020   Meningococcal B, Unspecified 09/04/2020   Pneumococcal Conjugate-13 06/27/2016   Pneumococcal Polysaccharide-23 04/14/2005, 09/17/2017, 09/04/2020   Tdap 04/28/2019    Conditions to be addressed/monitored:  Hypertension, Hyperlipidemia, Coronary Artery Disease, GERD, Anxiety, Osteoarthritis, and Pancreatic Cancer   Care Plan : CCM Care Plan  Updates made by Tomasa Blase, RPH since 03/25/2021 12:00 AM     Problem: Hypertension, Hyperlipidemia, Coronary Artery Disease, GERD, Anxiety, Osteoarthritis, and Pancreatic Cancer   Priority: High  Onset Date: 03/25/2021     Long-Range Goal: Disease Management   Start Date: 03/25/2021   Expected End Date: 09/22/2021  This Visit's Progress: On track  Priority: High  Note:   Current Barriers:  Unable to independently monitor therapeutic efficacy  Pharmacist Clinical Goal(s):  Patient will achieve adherence to monitoring guidelines and medication adherence to achieve therapeutic efficacy achieve improvement in GERD/ pain control as evidenced by reduction of acid reflux/ pain levels through collaboration with PharmD and provider.   Interventions: 1:1 collaboration with Plotnikov, Evie Lacks, MD regarding development and update of comprehensive plan of care as evidenced by provider attestation and co-signature Inter-disciplinary care team collaboration (see longitudinal plan of care) Comprehensive medication review performed; medication list updated in electronic medical record  Hypertension (BP goal <140/90) -Controlled -Current treatment: Metoprolol Succinate 19m - 1/2 tablet twice daily  -Medications previously tried: furosemide   -Current home readings: checking at office visits BP Readings from Last 3 Encounters:  03/13/21 (!) 141/62  02/27/21 (!) 116/51  02/13/21 (!) 121/53  -Current dietary habits: lightly salted, 1 cup of coffee on occasion -Current exercise habits: none at this time  -Denies hypotensive/hypertensive symptoms -Educated on BP goals and benefits of medications for prevention of heart attack, stroke and kidney damage; Daily salt intake goal < 2300 mg; Exercise goal of 150 minutes per week; -Counseled on diet and exercise extensively Recommended to continue current medication  Hyperlipidemia/ CAD: (LDL goal < 100) -Unknown level of control at this time  Lab Results  Component Value Date   LLos Veteranos II96 09/17/2017  -Current treatment: Rosuvastatin 152m- 1 tablet daily  -Medications previously tried: n/a  -Current dietary patterns: reports that she eats mostly chicken, denies eating fried/ fatty foods -Current exercise habits: none at this  time  -Educated on Cholesterol goals;  Benefits of statin for ASCVD risk reduction; Importance of limiting foods high in cholesterol; Exercise goal of 150 minutes per week; -Counseled on diet and exercise extensively Recommended to continue current medication Recommended for patient to have updated lipid panel with PCP appointment next month  Depression/Anxiety (Goal: Prevention of anxiety attacks / promotion of positive mood) -Not ideally controlled -Current treatment: Alprazolam 0.52m37m 1 tablet twice daily as needed  - usually taking 1/2 tablet nightly  -Medications previously tried/failed: diazepam, hydroxyzine, lorazepam, esctialopram, nortriptyline, paroxetine,  -GAD7: 12 -Educated on Benefits of medication for symptom control Benefits of cognitive-behavioral therapy with or without medication -Recommended to continue current medication Educated on benefits for CBT - patient declined referral for therapist at this time   GERD/ Stomach Ulcer (Goal: Prevention/ control of acid reflux/ prevention of ulceration/ infection) -Controlled -Current  treatment  Omeprazole 19m - 1 capsule twice daily - not currently taking due to oncology treatment  -Medications previously tried: ranitidine   -Counseled on diet and exercise extensively Recommended continue hold of omeprazole, will plan to discontinue oral NSAID to help prevent worsening GERD/ulceration   Pancreatic Cancer (Goal: disease control /monitoring of side effects) -Controlled -Current treatment  Capecitabine 5045m- 2 tablets twice daily x 21 days, hold x 7 days, then repeat (28 day cycle) Prochlorperazine 104m 1 tablet every 6 hours as needed  - not currently using  Ondansetron 8mg20m1 tablet every 8 hours as needed - not currently using  Lidocaine-prilocaine cream - apply prior to chemo treatments - not currently using  -Medications previously tried: gemcitabine   -Recommended to continue current medication per  hematology/oncology    Chronic Pain / OA/ Neuropathy (Goal: pain control) -Controlled -Current treatment  Ibuprofen 600mg35m tablet every 6 hours as needed  - taking 1 tablet 2-3 times weekly Gabapentin 300mg 65mcapsule 3 times daily - not currently taking as it makes her feel sick Hydrocodone-APAP 7.5-325mg - 1 tablet every 8 hours as needed  - at most takes twice daily  -Medications previously tried: meloxicam, celecoxib, percocet, methocarbamol, cyclobenzaprine, tizanidine, diclofenac, naproxen, oxycodone,   -Recommended for patient to trial voltaren gel in place of ibuprofen, patient reports that she will take ibuprofen for a burning pain she can get in her feet and right leg, gabapentin is intolerable / ineffective at treating this pain, plan to avoid oral NSAIDs in setting of stomach ulcer - patient agreeable to plan  Patient Goals/Self-Care Activities Patient will:  - take medications as prescribed target a minimum of 150 minutes of moderate intensity exercise weekly  Follow Up Plan: Telephone follow up appointment with care management team member scheduled for: 2 months The patient has been provided with contact information for the care management team and has been advised to call with any health related questions or concerns.        Medication Assistance: None required.  Patient affirms current coverage meets needs.   [facility]  [facility] [address] [address]: [id number] [address]-[address]: [phone] [address] [address]SouthmontC 29[facility]-[address]: [phone] pill box? No - able to manage without  use of pill box Pt endorses 100% compliance  Care Plan and Follow Up Patient Decision:  Patient agrees to Care Plan and Follow-up.  Plan: Telephone follow up appointment with care management team member scheduled for:  2 months and The patient has been provided with contact information for the care management team and has been advised to call with any health related questions or concerns.   DanielTomasa BlasemD Clinical Pharmacist, [place] examination/treatment/procedure(s) were performed by non-physician practitioner and as supervising physician I was immediately available for consultation/collaboration.  I agree with above. AlekseLew Dawes

## 2021-03-25 NOTE — Patient Instructions (Signed)
Amanda Arnold,  It was great to talk to you today!  Please call me with any questions or concerns.  Visit Information   PATIENT GOALS:   Goals Addressed             This Visit's Progress    Lifestyle Change-Hypertension       Timeframe:  Long-Range Goal Priority:  Medium Start Date:  03/25/2021                           Expected End Date:  09/22/2021                     Follow Up Date 11/10/202   - agree to work together to make changes - ask questions to understand - learn about high blood pressure    Why is this important?   The changes that you are asked to make may be hard to do.  This is especially true when the changes are life-long.  Knowing why it is important to you is the first step.  Working on the change with your family or support person helps you not feel alone.  Reward yourself and family or support person when goals are met. This can be an activity you choose like bowling, hiking, biking, swimming or shooting hoops.       Manage Chronic Pain       Timeframe:  Long-Range Goal Priority:  High Start Date:  03/25/2021                           Expected End Date:  09/22/2021                   Follow Up Date 05/23/2021   - call for medicine refill 2 or 3 days before it runs out - develop a personal pain management plan - keep track of prescription refills - plan exercise or activity when pain is best controlled - prioritize tasks for the day - track times pain is worst and when it is best - track what makes the pain worse and what makes it better - use ice or heat for pain relief - work slower and less intense when having pain    Why is this important?   Day-to-day life can be hard when you have chronic pain.  Pain medicine is just one piece of the treatment puzzle.  You can try these action steps to help you manage your pain.         Consent to CCM Services: Amanda Arnold was given information about Chronic Care Management services including:  CCM  service includes personalized support from designated clinical staff supervised by her physician, including individualized plan of care and coordination with other care providers 24/7 contact phone numbers for assistance for urgent and routine care needs. Service will only be billed when office clinical staff spend 20 minutes or more in a month to coordinate care. Only one practitioner may furnish and bill the service in a calendar month. The patient may stop CCM services at any time (effective at the end of the month) by phone call to the office staff. The patient will be responsible for cost sharing (co-pay) of up to 20% of the service fee (after annual deductible is met).  Patient agreed to services and verbal consent obtained.   Patient verbalizes understanding of instructions provided today and agrees to view in  MyChart.   Telephone follow up appointment with care management team member scheduled for: 3 months The patient has been provided with contact information for the care management team and has been advised to call with any health related questions or concerns.   Tomasa Blase, PharmD Clinical Pharmacist, [name]   CLINICAL CARE PLAN: Patient Care Plan: CCM Care Plan     Problem Identified: Hypertension, Hyperlipidemia, Coronary Artery Disease, GERD, Anxiety, Osteoarthritis, and Pancreatic Cancer   Priority: High  Onset Date: 03/25/2021     Long-Range Goal: Disease Management   Start Date: 03/25/2021  Expected End Date: 09/22/2021  This Visit's Progress: On track  Priority: High  Note:   Current Barriers:  Unable to independently monitor therapeutic efficacy  Pharmacist Clinical Goal(s):  Patient will achieve adherence to monitoring guidelines and medication adherence to achieve therapeutic efficacy achieve improvement in GERD/ pain control as evidenced by reduction of acid reflux/ pain levels through collaboration with PharmD and provider.    Interventions: 1:1 collaboration with Plotnikov, Amanda Lacks, MD regarding development and update of comprehensive plan of care as evidenced by provider attestation and co-signature Inter-disciplinary care team collaboration (see longitudinal plan of care) Comprehensive medication review performed; medication list updated in electronic medical record  Hypertension (BP goal <140/90) -Controlled -Current treatment: Metoprolol Succinate 32m - 1/2 tablet twice daily  -Medications previously tried: furosemide   -Current home readings: checking at office visits BP Readings from Last 3 Encounters:  03/13/21 (!) 141/62  02/27/21 (!) 116/51  02/13/21 (!) 121/53  -Current dietary habits: lightly salted, 1 cup of coffee on occasion -Current exercise habits: none at this time  -Denies hypotensive/hypertensive symptoms -Educated on BP goals and benefits of medications for prevention of heart attack, stroke and kidney damage; Daily salt intake goal < 2300 mg; Exercise goal of 150 minutes per week; -Counseled on diet and exercise extensively Recommended to continue current medication  Hyperlipidemia/ CAD: (LDL goal < 100) -Unknown level of control at this time  Lab Results  Component Value Date   LShiloh96 09/17/2017  -Current treatment: Rosuvastatin 147m- 1 tablet daily  -Medications previously tried: n/a  -Current dietary patterns: reports that she eats mostly chicken, denies eating fried/ fatty foods -Current exercise habits: none at this time  -Educated on Cholesterol goals;  Benefits of statin for ASCVD risk reduction; Importance of limiting foods high in cholesterol; Exercise goal of 150 minutes per week; -Counseled on diet and exercise extensively Recommended to continue current medication Recommended for patient to have updated lipid panel with PCP appointment next month  Depression/Anxiety (Goal: Prevention of anxiety attacks / promotion of positive mood) -Not ideally  controlled -Current treatment: Alprazolam 0.50m32m 1 tablet twice daily as needed  - usually taking 1/2 tablet nightly  -Medications previously tried/failed: diazepam, hydroxyzine, lorazepam, esctialopram, nortriptyline, paroxetine,  -GAD7: 12 -Educated on Benefits of medication for symptom control Benefits of cognitive-behavioral therapy with or without medication -Recommended to continue current medication Educated on benefits for CBT - patient declined referral for therapist at this time   GERD/ Stomach Ulcer (Goal: Prevention/ control of acid reflux/ prevention of ulceration/ infection) -Controlled -Current treatment  Omeprazole 69m77m1 capsule twice daily - not currently taking due to oncology treatment  -Medications previously tried: ranitidine   -Counseled on diet and exercise extensively Recommended continue hold of omeprazole, will plan to discontinue oral NSAID to help prevent worsening GERD/ulceration   Pancreatic Cancer (Goal: disease control /monitoring of side effects) -Controlled -Current  treatment  Capecitabine 57m - 2 tablets twice daily x 21 days, hold x 7 days, then repeat (28 day cycle) Prochlorperazine 164m- 1 tablet every 6 hours as needed  - not currently using  Ondansetron 91m36m 1 tablet every 8 hours as needed - not currently using  Lidocaine-prilocaine cream - apply prior to chemo treatments - not currently using  -Medications previously tried: gemcitabine   -Recommended to continue current medication per hematology/oncology    Chronic Pain / OA/ Neuropathy (Goal: pain control) -Controlled -Current treatment  Ibuprofen 600m81m1 tablet every 6 hours as needed  - taking 1 tablet 2-3 times weekly Gabapentin 300mg56m capsule 3 times daily - not currently taking as it makes her feel sick Hydrocodone-APAP 7.5-325mg - 1 tablet every 8 hours as needed  - at most takes twice daily  -Medications previously tried: meloxicam, celecoxib, percocet, methocarbamol,  cyclobenzaprine, tizanidine, diclofenac, naproxen, oxycodone,   -Recommended for patient to trial voltaren gel in place of ibuprofen, patient reports that she will take ibuprofen for a burning pain she can get in her feet and right leg, gabapentin is intolerable / ineffective at treating this pain, plan to avoid oral NSAIDs in setting of stomach ulcer - patient agreeable to plan  Patient Goals/Self-Care Activities Patient will:  - take medications as prescribed target a minimum of 150 minutes of moderate intensity exercise weekly  Follow Up Plan: Telephone follow up appointment with care management team member scheduled for: 2 months The patient has been provided with contact information for the care management team and has been advised to call with any health related questions or concerns.

## 2021-03-27 ENCOUNTER — Inpatient Hospital Stay: Payer: Medicare Other | Attending: Oncology

## 2021-03-27 ENCOUNTER — Encounter: Payer: Self-pay | Admitting: Nurse Practitioner

## 2021-03-27 ENCOUNTER — Inpatient Hospital Stay: Payer: Medicare Other

## 2021-03-27 ENCOUNTER — Ambulatory Visit: Payer: Medicare Other | Admitting: Nurse Practitioner

## 2021-03-27 ENCOUNTER — Other Ambulatory Visit: Payer: Self-pay

## 2021-03-27 ENCOUNTER — Ambulatory Visit: Payer: Medicare Other

## 2021-03-27 ENCOUNTER — Inpatient Hospital Stay: Payer: Medicare Other | Admitting: Nurse Practitioner

## 2021-03-27 ENCOUNTER — Other Ambulatory Visit: Payer: Medicare Other

## 2021-03-27 VITALS — BP 140/58 | HR 51 | Temp 98.2°F | Resp 18 | Ht 62.0 in | Wt 169.8 lb

## 2021-03-27 DIAGNOSIS — C252 Malignant neoplasm of tail of pancreas: Secondary | ICD-10-CM

## 2021-03-27 DIAGNOSIS — R739 Hyperglycemia, unspecified: Secondary | ICD-10-CM | POA: Insufficient documentation

## 2021-03-27 DIAGNOSIS — Z8616 Personal history of COVID-19: Secondary | ICD-10-CM | POA: Insufficient documentation

## 2021-03-27 DIAGNOSIS — K259 Gastric ulcer, unspecified as acute or chronic, without hemorrhage or perforation: Secondary | ICD-10-CM | POA: Diagnosis not present

## 2021-03-27 DIAGNOSIS — Z95828 Presence of other vascular implants and grafts: Secondary | ICD-10-CM

## 2021-03-27 DIAGNOSIS — Z87891 Personal history of nicotine dependence: Secondary | ICD-10-CM | POA: Diagnosis not present

## 2021-03-27 DIAGNOSIS — Z79899 Other long term (current) drug therapy: Secondary | ICD-10-CM | POA: Diagnosis not present

## 2021-03-27 DIAGNOSIS — Z5111 Encounter for antineoplastic chemotherapy: Secondary | ICD-10-CM | POA: Insufficient documentation

## 2021-03-27 DIAGNOSIS — I10 Essential (primary) hypertension: Secondary | ICD-10-CM | POA: Insufficient documentation

## 2021-03-27 DIAGNOSIS — R911 Solitary pulmonary nodule: Secondary | ICD-10-CM | POA: Diagnosis not present

## 2021-03-27 DIAGNOSIS — J449 Chronic obstructive pulmonary disease, unspecified: Secondary | ICD-10-CM | POA: Insufficient documentation

## 2021-03-27 LAB — CBC WITH DIFFERENTIAL (CANCER CENTER ONLY)
Abs Immature Granulocytes: 0.01 10*3/uL (ref 0.00–0.07)
Basophils Absolute: 0 10*3/uL (ref 0.0–0.1)
Basophils Relative: 1 %
Eosinophils Absolute: 0.1 10*3/uL (ref 0.0–0.5)
Eosinophils Relative: 2 %
HCT: 34.4 % — ABNORMAL LOW (ref 36.0–46.0)
Hemoglobin: 11.6 g/dL — ABNORMAL LOW (ref 12.0–15.0)
Immature Granulocytes: 0 %
Lymphocytes Relative: 37 %
Lymphs Abs: 2.3 10*3/uL (ref 0.7–4.0)
MCH: 38.5 pg — ABNORMAL HIGH (ref 26.0–34.0)
MCHC: 33.7 g/dL (ref 30.0–36.0)
MCV: 114.3 fL — ABNORMAL HIGH (ref 80.0–100.0)
Monocytes Absolute: 0.8 10*3/uL (ref 0.1–1.0)
Monocytes Relative: 12 %
Neutro Abs: 3.1 10*3/uL (ref 1.7–7.7)
Neutrophils Relative %: 48 %
Platelet Count: 201 10*3/uL (ref 150–400)
RBC: 3.01 MIL/uL — ABNORMAL LOW (ref 3.87–5.11)
RDW: 18.1 % — ABNORMAL HIGH (ref 11.5–15.5)
WBC Count: 6.3 10*3/uL (ref 4.0–10.5)
nRBC: 0.5 % — ABNORMAL HIGH (ref 0.0–0.2)

## 2021-03-27 LAB — CMP (CANCER CENTER ONLY)
ALT: 14 U/L (ref 0–44)
AST: 24 U/L (ref 15–41)
Albumin: 4 g/dL (ref 3.5–5.0)
Alkaline Phosphatase: 67 U/L (ref 38–126)
Anion gap: 8 (ref 5–15)
BUN: 18 mg/dL (ref 8–23)
CO2: 26 mmol/L (ref 22–32)
Calcium: 9 mg/dL (ref 8.9–10.3)
Chloride: 105 mmol/L (ref 98–111)
Creatinine: 0.49 mg/dL (ref 0.44–1.00)
GFR, Estimated: >60 mL/min (ref >60)
Glucose, Bld: 204 mg/dL — ABNORMAL HIGH (ref 70–99)
Potassium: 4 mmol/L (ref 3.5–5.1)
Sodium: 139 mmol/L (ref 135–145)
Total Bilirubin: 0.8 mg/dL (ref 0.3–1.2)
Total Protein: 6.5 g/dL (ref 6.5–8.1)

## 2021-03-27 MED ORDER — PROCHLORPERAZINE MALEATE 10 MG PO TABS
10.0000 mg | ORAL_TABLET | Freq: Once | ORAL | Status: AC
  Administered 2021-03-27: 10 mg via ORAL
  Filled 2021-03-27: qty 1

## 2021-03-27 MED ORDER — SODIUM CHLORIDE 0.9 % IV SOLN
1000.0000 mg/m2 | Freq: Once | INTRAVENOUS | Status: AC
  Administered 2021-03-27: 1786 mg via INTRAVENOUS
  Filled 2021-03-27: qty 26.3

## 2021-03-27 MED ORDER — SODIUM CHLORIDE 0.9 % IV SOLN: Freq: Once | INTRAVENOUS | Status: AC

## 2021-03-27 MED ORDER — HEPARIN SOD (PORK) LOCK FLUSH 100 UNIT/ML IV SOLN
500.0000 [IU] | Freq: Once | INTRAVENOUS | Status: AC
  Administered 2021-03-27: 500 [IU] via INTRAVENOUS

## 2021-03-27 NOTE — Patient Instructions (Signed)
[facility]  Discharge Instructions: Thank you for choosing [facility] to provide your oncology and hematology care.   If you have a lab appointment with the [facility], please go directly to the [facility] and check in at the registration area.   Wear comfortable clothing and clothing appropriate for easy access to any Portacath or PICC line.   We strive to give you quality time with your provider. You may need to reschedule your appointment if you arrive late (15 or more minutes).  Arriving late affects you and other patients whose appointments are after yours.  Also, if you miss three or more appointments without notifying the office, you may be dismissed from the clinic at the provider's discretion.      For prescription refill requests, have your pharmacy contact our office and allow 72 hours for refills to be completed.    Today you received the following chemotherapy and/or immunotherapy agents GEMZAR      To help prevent nausea and vomiting after your treatment, we encourage you to take your nausea medication as directed.  BELOW ARE SYMPTOMS THAT SHOULD BE REPORTED IMMEDIATELY: *FEVER GREATER THAN 100.4 F (38 C) OR HIGHER *CHILLS OR SWEATING *NAUSEA AND VOMITING THAT IS NOT CONTROLLED WITH YOUR NAUSEA MEDICATION *UNUSUAL SHORTNESS OF BREATH *UNUSUAL BRUISING OR BLEEDING *URINARY PROBLEMS (pain or burning when urinating, or frequent urination) *BOWEL PROBLEMS (unusual diarrhea, constipation, pain near the anus) TENDERNESS IN MOUTH AND THROAT WITH OR WITHOUT PRESENCE OF ULCERS (sore throat, sores in mouth, or a toothache) UNUSUAL RASH, SWELLING OR PAIN  UNUSUAL VAGINAL DISCHARGE OR ITCHING   Items with * indicate a potential emergency and should be followed up as soon as possible or go to the Emergency Department if any problems should occur.  Please show the CHEMOTHERAPY ALERT CARD or IMMUNOTHERAPY ALERT CARD at check-in to the  Emergency Department and triage nurse.  Should you have questions after your visit or need to cancel or reschedule your appointment, please contact [facility]  Dept: [phone]  and follow the prompts.  Office hours are 8:00 a.m. to 4:30 p.m. Monday - Friday. Please note that voicemails left after 4:00 p.m. may not be returned until the following business day.  We are closed weekends and major holidays. You have access to a nurse at all times for urgent questions. Please call the main number to the clinic Dept: [phone] and follow the prompts.   For any non-urgent questions, you may also contact your provider using MyChart. We now offer e-Visits for anyone 30 and older to request care online for non-urgent symptoms. For details visit [email or web address].   Also download the MyChart app! Go to the app store, search "MyChart", open the app, select [facility], and log in with your MyChart username and password.  Due to Covid, a mask is required upon entering the hospital/clinic. If you do not have a mask, one will be given to you upon arrival. For doctor visits, patients may have 1 support person aged 66 or older with them. For treatment visits, patients cannot have anyone with them due to current Covid guidelines and our immunocompromised population.   Gemcitabine injection What is this medication? GEMCITABINE (jem SYE ta been) is a chemotherapy drug. This medicine is used to treat many types of cancer like breast cancer, lung cancer, pancreatic cancer, and ovarian cancer. This medicine may be used for other purposes; ask your health care provider or  pharmacist if you have questions. COMMON BRAND NAME(S): Gemzar, Infugem What should I tell my care team before I take this medication? They need to know if you have any of these conditions: blood disorders infection kidney disease liver disease lung or breathing disease, like asthma recent or ongoing radiation  therapy an unusual or allergic reaction to gemcitabine, other chemotherapy, other medicines, foods, dyes, or preservatives pregnant or trying to get pregnant breast-feeding How should I use this medication? This drug is given as an infusion into a vein. It is administered in a hospital or clinic by a specially trained health care professional. Talk to your pediatrician regarding the use of this medicine in children. Special care may be needed. Overdosage: If you think you have taken too much of this medicine contact a poison control center or emergency room at once. NOTE: This medicine is only for you. Do not share this medicine with others. What if I miss a dose? It is important not to miss your dose. Call your doctor or health care professional if you are unable to keep an appointment. What may interact with this medication? medicines to increase blood counts like filgrastim, pegfilgrastim, sargramostim some other chemotherapy drugs like cisplatin vaccines Talk to your doctor or health care professional before taking any of these medicines: acetaminophen aspirin ibuprofen ketoprofen naproxen This list may not describe all possible interactions. Give your health care provider a list of all the medicines, herbs, non-prescription drugs, or dietary supplements you use. Also tell them if you smoke, drink alcohol, or use illegal drugs. Some items may interact with your medicine. What should I watch for while using this medication? Visit your doctor for checks on your progress. This drug may make you feel generally unwell. This is not uncommon, as chemotherapy can affect healthy cells as well as cancer cells. Report any side effects. Continue your course of treatment even though you feel ill unless your doctor tells you to stop. In some cases, you may be given additional medicines to help with side effects. Follow all directions for their use. Call your doctor or health care professional for  advice if you get a fever, chills or sore throat, or other symptoms of a cold or flu. Do not treat yourself. This drug decreases your body's ability to fight infections. Try to avoid being around people who are sick. This medicine may increase your risk to bruise or bleed. Call your doctor or health care professional if you notice any unusual bleeding. Be careful brushing and flossing your teeth or using a toothpick because you may get an infection or bleed more easily. If you have any dental work done, tell your dentist you are receiving this medicine. Avoid taking products that contain aspirin, acetaminophen, ibuprofen, naproxen, or ketoprofen unless instructed by your doctor. These medicines may hide a fever. Do not become pregnant while taking this medicine or for 6 months after stopping it. Women should inform their doctor if they wish to become pregnant or think they might be pregnant. Men should not father a child while taking this medicine and for 3 months after stopping it. There is a potential for serious side effects to an unborn child. Talk to your health care professional or pharmacist for more information. Do not breast-feed an infant while taking this medicine or for at least 1 week after stopping it. Men should inform their doctors if they wish to father a child. This medicine may lower sperm counts. Talk with your doctor or health care  professional if you are concerned about your fertility. What side effects may I notice from receiving this medication? Side effects that you should report to your doctor or health care professional as soon as possible: allergic reactions like skin rash, itching or hives, swelling of the face, lips, or tongue breathing problems pain, redness, or irritation at site where injected signs and symptoms of a dangerous change in heartbeat or heart rhythm like chest pain; dizziness; fast or irregular heartbeat; palpitations; feeling faint or lightheaded, falls;  breathing problems signs of decreased platelets or bleeding - bruising, pinpoint red spots on the skin, black, tarry stools, blood in the urine signs of decreased red blood cells - unusually weak or tired, feeling faint or lightheaded, falls signs of infection - fever or chills, cough, sore throat, pain or difficulty passing urine signs and symptoms of kidney injury like trouble passing urine or change in the amount of urine signs and symptoms of liver injury like dark yellow or brown urine; general ill feeling or flu-like symptoms; light-colored stools; loss of appetite; nausea; right upper belly pain; unusually weak or tired; yellowing of the eyes or skin swelling of ankles, feet, hands Side effects that usually do not require medical attention (report to your doctor or health care professional if they continue or are bothersome): constipation diarrhea hair loss loss of appetite nausea rash vomiting This list may not describe all possible side effects. Call your doctor for medical advice about side effects. You may report side effects to FDA at 1-800-FDA-1088. Where should I keep my medication? This drug is given in a hospital or clinic and will not be stored at home. NOTE: This sheet is a summary. It may not cover all possible information. If you have questions about this medicine, talk to your doctor, pharmacist, or health care provider.  2022 Elsevier/Gold Standard (2017-09-23 18:06:11)

## 2021-03-27 NOTE — Progress Notes (Signed)
  [facility] OFFICE PROGRESS NOTE   Diagnosis: Pancreas cancer  INTERVAL HISTORY:   Amanda Arnold returns as scheduled.  She began cycle 5 gemcitabine/capecitabine 03/13/2021.  She denies nausea/vomiting.  No mouth sores.  No diarrhea.  She notes feet are dry.  No associated discomfort.  No fever or rash after treatment.  Objective:  Vital signs in last 24 hours:  Blood pressure (!) 140/58, pulse (!) 51, temperature 98.2 F (36.8 C), temperature source Oral, resp. rate 18, height '5\' 2"'$  (1.575 m), weight 169 lb 12.8 oz (77 kg), SpO2 100 %.    HEENT: No thrush or ulcers. Resp: Lungs clear bilaterally. Cardio: Regular rate and rhythm. GI: Abdomen soft and nontender.  No hepatomegaly. Vascular: No leg edema. Skin: Palms and soles with mild erythema.  Feet dry appearing. Port-A-Cath without erythema.   Lab Results:  Lab Results  Component Value Date   WBC 6.3 03/27/2021   HGB 11.6 (L) 03/27/2021   HCT 34.4 (L) 03/27/2021   MCV 114.3 (H) 03/27/2021   PLT 201 03/27/2021   NEUTROABS 3.1 03/27/2021    Imaging:  No results found.  Medications: I have reviewed the patient's current medications.  Assessment/Plan: Pancreas cancer-stage IIb (pT2pN1) CT abdomen/pelvis 06/01/2020-2.9 x 3 cm pancreas tail mass, slight increase in size of 7 mm right middle lobe nodule MRI/MRCP 06/22/2020-solid 3.8 x 3 x 3.1 cm pancreas tail mass with focal occlusion of the splenic vein and dilated venous collaterals, no evidence of metastatic disease, bilateral adrenal adenomas CT renal stone study 08/02/2020-scattered airspace opacities throughout the lung bases-new favored to represent infectious/inflammatory changes EUS 08/16/2020-pancreas tail mass, T2N0, FNA biopsy-suspicious for malignancy CTs 08/27/2020-unchanged right middle lobe nodule, superficial left upper back lesion, no change in pancreas tail mass, occluded splenic vein with collaterals at the splenic hilum, no enlarged  abdominal pelvic lymph nodes Distal pancreatectomy/splenectomy 10/17/2020- 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  2/15 lymph nodes positive, positive lymphovascular and perineural invasion, resection margins negative, pT2pN1 Cycle 1 gemcitabine/capecitabine 11/21/2020, day 15 gemcitabine 12/05/2020 Cycle 2 gemcitabine/capecitabine 12/20/2020, day 15 gemcitabine 01/03/2021 Cycle 3 gemcitabine/capecitabine 01/16/2021, day 15 gemcitabine 01/30/2021 Cycle 4 gemcitabine/capecitabine 02/13/2021, day 15 gemcitabine 02/27/2021 Cycle 5 gemcitabine/capecitabine 03/13/2021 (capecitabine dose reduced secondary to hand/foot syndrome), day 15 gemcitabine 03/27/2021 2.   Gastric ulcer on EUS 08/16/2020-H. pylori positive 3.   COVID-19 infection January 2022 4.   COPD 5.   Hypertension 6.   Right abdomen/right leg pain-musculoskeletal? 7.   Right middle lobe nodule on CT 06/01/2020, increased from 2017, stable on CT 11/13/2020 8.   History of tobacco use 9.   Superficial left upper back lesion noted on CT 08/27/2020      Disposition: Amanda Arnold appears stable.  She is completing cycle 5 gemcitabine/capecitabine.  Plan to proceed with day 15 gemcitabine today as scheduled.  We reviewed the CBC and chemistry panel from today.  Labs adequate to proceed with treatment.  We discussed the elevated glucose.  She will try to limit concentrated sweets.  She will return for lab, follow-up, cycle 6-day 1 gemcitabine/capecitabine in 2 weeks.  We are available to see her sooner if needed.    Ned Card ANP/GNP-BC   03/27/2021  9:53 AM

## 2021-03-27 NOTE — Progress Notes (Signed)
Patient presents for treatment. RN assessment completed along with the following:  Treatment Conditions (labs/vitals/weight) reviewed - Yes, and within treatment parameters.   Oncology Treatment Attestation completed for current therapy- Yes, on date 11/08/20 Informed consent completed and reflects current therapy/intent - Yes, on date 11/21/20             Provider progress note reviewed - Yes, today's provider note was reviewed. Treatment/Antibody/Supportive plan reviewed -Yes, and there are no adjustments needed for today's treatment."} S&H and other orders reviewed - Yes, and there are no additional orders identified. Previous treatment date reviewed - Yes, and the appropriate amount of time has elapsed between treatments. Clinic Hand Off Received from - [facility]  Patient to proceed with treatment.

## 2021-04-02 ENCOUNTER — Other Ambulatory Visit (HOSPITAL_COMMUNITY): Payer: Self-pay

## 2021-04-03 ENCOUNTER — Other Ambulatory Visit: Payer: Self-pay | Admitting: Oncology

## 2021-04-03 ENCOUNTER — Other Ambulatory Visit (HOSPITAL_COMMUNITY): Payer: Self-pay

## 2021-04-03 DIAGNOSIS — C252 Malignant neoplasm of tail of pancreas: Secondary | ICD-10-CM

## 2021-04-05 ENCOUNTER — Other Ambulatory Visit (HOSPITAL_COMMUNITY): Payer: Self-pay

## 2021-04-05 ENCOUNTER — Other Ambulatory Visit: Payer: Self-pay | Admitting: Oncology

## 2021-04-08 ENCOUNTER — Encounter: Payer: Self-pay | Admitting: Oncology

## 2021-04-08 ENCOUNTER — Other Ambulatory Visit (HOSPITAL_COMMUNITY): Payer: Self-pay

## 2021-04-08 MED ORDER — CAPECITABINE 500 MG PO TABS
1000.0000 mg | ORAL_TABLET | Freq: Two times a day (BID) | ORAL | 0 refills | Status: DC
  Filled 2021-04-08: qty 44, 11d supply, fill #0

## 2021-04-11 ENCOUNTER — Inpatient Hospital Stay: Payer: Medicare Other | Admitting: Oncology

## 2021-04-11 ENCOUNTER — Inpatient Hospital Stay: Payer: Medicare Other

## 2021-04-11 ENCOUNTER — Telehealth: Payer: Self-pay

## 2021-04-11 ENCOUNTER — Other Ambulatory Visit: Payer: Self-pay

## 2021-04-11 VITALS — BP 112/60 | HR 60 | Temp 98.2°F | Resp 20 | Ht 62.0 in | Wt 168.2 lb

## 2021-04-11 DIAGNOSIS — C252 Malignant neoplasm of tail of pancreas: Secondary | ICD-10-CM

## 2021-04-11 DIAGNOSIS — Z5111 Encounter for antineoplastic chemotherapy: Secondary | ICD-10-CM | POA: Diagnosis not present

## 2021-04-11 DIAGNOSIS — R911 Solitary pulmonary nodule: Secondary | ICD-10-CM | POA: Diagnosis not present

## 2021-04-11 DIAGNOSIS — J449 Chronic obstructive pulmonary disease, unspecified: Secondary | ICD-10-CM | POA: Diagnosis not present

## 2021-04-11 DIAGNOSIS — Z87891 Personal history of nicotine dependence: Secondary | ICD-10-CM | POA: Diagnosis not present

## 2021-04-11 DIAGNOSIS — Z8616 Personal history of COVID-19: Secondary | ICD-10-CM | POA: Diagnosis not present

## 2021-04-11 DIAGNOSIS — Z79899 Other long term (current) drug therapy: Secondary | ICD-10-CM | POA: Diagnosis not present

## 2021-04-11 DIAGNOSIS — I10 Essential (primary) hypertension: Secondary | ICD-10-CM | POA: Diagnosis not present

## 2021-04-11 DIAGNOSIS — R739 Hyperglycemia, unspecified: Secondary | ICD-10-CM | POA: Diagnosis not present

## 2021-04-11 DIAGNOSIS — K259 Gastric ulcer, unspecified as acute or chronic, without hemorrhage or perforation: Secondary | ICD-10-CM | POA: Diagnosis not present

## 2021-04-11 LAB — CBC WITH DIFFERENTIAL (CANCER CENTER ONLY)
Abs Immature Granulocytes: 0.03 10*3/uL (ref 0.00–0.07)
Basophils Absolute: 0.1 10*3/uL (ref 0.0–0.1)
Basophils Relative: 1 %
Eosinophils Absolute: 0.1 10*3/uL (ref 0.0–0.5)
Eosinophils Relative: 1 %
HCT: 35.8 % — ABNORMAL LOW (ref 36.0–46.0)
Hemoglobin: 12.5 g/dL (ref 12.0–15.0)
Immature Granulocytes: 0 %
Lymphocytes Relative: 25 %
Lymphs Abs: 2 10*3/uL (ref 0.7–4.0)
MCH: 38.9 pg — ABNORMAL HIGH (ref 26.0–34.0)
MCHC: 34.9 g/dL (ref 30.0–36.0)
MCV: 111.5 fL — ABNORMAL HIGH (ref 80.0–100.0)
Monocytes Absolute: 1.1 10*3/uL — ABNORMAL HIGH (ref 0.1–1.0)
Monocytes Relative: 14 %
Neutro Abs: 4.9 10*3/uL (ref 1.7–7.7)
Neutrophils Relative %: 59 %
Platelet Count: 202 10*3/uL (ref 150–400)
RBC: 3.21 MIL/uL — ABNORMAL LOW (ref 3.87–5.11)
RDW: 16.4 % — ABNORMAL HIGH (ref 11.5–15.5)
WBC Count: 8.2 10*3/uL (ref 4.0–10.5)
nRBC: 0 % (ref 0.0–0.2)

## 2021-04-11 LAB — CMP (CANCER CENTER ONLY)
ALT: 15 U/L (ref 0–44)
AST: 32 U/L (ref 15–41)
Albumin: 4.2 g/dL (ref 3.5–5.0)
Alkaline Phosphatase: 69 U/L (ref 38–126)
Anion gap: 8 (ref 5–15)
BUN: 18 mg/dL (ref 8–23)
CO2: 26 mmol/L (ref 22–32)
Calcium: 9.4 mg/dL (ref 8.9–10.3)
Chloride: 104 mmol/L (ref 98–111)
Creatinine: 0.57 mg/dL (ref 0.44–1.00)
GFR, Estimated: >60 mL/min (ref >60)
Glucose, Bld: 140 mg/dL — ABNORMAL HIGH (ref 70–99)
Potassium: 3.9 mmol/L (ref 3.5–5.1)
Sodium: 138 mmol/L (ref 135–145)
Total Bilirubin: 0.9 mg/dL (ref 0.3–1.2)
Total Protein: 6.4 g/dL — ABNORMAL LOW (ref 6.5–8.1)

## 2021-04-11 MED ORDER — PROCHLORPERAZINE MALEATE 10 MG PO TABS
10.0000 mg | ORAL_TABLET | Freq: Once | ORAL | Status: AC
  Administered 2021-04-11: 10 mg via ORAL
  Filled 2021-04-11: qty 1

## 2021-04-11 MED ORDER — HEPARIN SOD (PORK) LOCK FLUSH 100 UNIT/ML IV SOLN
500.0000 [IU] | Freq: Once | INTRAVENOUS | Status: AC | PRN
  Administered 2021-04-11: 500 [IU]

## 2021-04-11 MED ORDER — SODIUM CHLORIDE 0.9 % IV SOLN: Freq: Once | INTRAVENOUS | Status: AC

## 2021-04-11 MED ORDER — SODIUM CHLORIDE 0.9 % IV SOLN
1000.0000 mg/m2 | Freq: Once | INTRAVENOUS | Status: AC
  Administered 2021-04-11: 1824 mg via INTRAVENOUS
  Filled 2021-04-11: qty 47.97

## 2021-04-11 MED ORDER — SODIUM CHLORIDE 0.9% FLUSH
10.0000 mL | INTRAVENOUS | Status: DC | PRN
  Administered 2021-04-11: 10 mL

## 2021-04-11 NOTE — Progress Notes (Signed)
Amanda Arnold OFFICE PROGRESS NOTE   Diagnosis: Pancreas cancer  INTERVAL HISTORY:   Amanda Arnold returns as scheduled.  She began another cycle of Xeloda yesterday.  She continues every 2-week gemcitabine.  No fever or rash.  She complains of feeling "dizzy "when changing position in bed and when sitting/standing from a lying position.  She has vertigo.  No falls.  She had a single episode of nausea/vomiting prior to beginning capecitabine this week.  The hand and foot symptoms have improved.  Objective:  Vital signs in last 24 hours:  Blood pressure 112/60, pulse 60, temperature 98.2 F (36.8 C), temperature source Oral, resp. rate 20, height 5\' 2"  (1.575 m), weight 168 lb 3.2 oz (76.3 kg), SpO2 97 %.    HEENT: No thrush or ulcers, medial deviation of the left eye (chronic per patient) Resp: Lungs clear bilaterally Cardio: Regular rate and rhythm GI: No hepatosplenomegaly, mild diffuse tenderness Vascular: No leg edema  Skin: Mild hyperpigmentation of the hands, callus formation and superficial desquamation at the soles  Portacath/PICC-without erythema  Lab Results:  Lab Results  Component Value Date   WBC 8.2 04/11/2021   HGB 12.5 04/11/2021   HCT 35.8 (L) 04/11/2021   MCV 111.5 (H) 04/11/2021   PLT 202 04/11/2021   NEUTROABS 4.9 04/11/2021    CMP  Lab Results  Component Value Date   NA 138 04/11/2021   K 3.9 04/11/2021   CL 104 04/11/2021   CO2 26 04/11/2021   GLUCOSE 140 (H) 04/11/2021   BUN 18 04/11/2021   CREATININE 0.57 04/11/2021   CALCIUM 9.4 04/11/2021   PROT 6.4 (L) 04/11/2021   ALBUMIN 4.2 04/11/2021   AST 32 04/11/2021   ALT 15 04/11/2021   ALKPHOS 69 04/11/2021   BILITOT 0.9 04/11/2021   GFRNONAA >60 04/11/2021   GFRAA 103 04/20/2020    Lab Results  Component Value Date   EGB151 13 11/21/2020     Medications: I have reviewed the patient's current medications.   Assessment/Plan: Pancreas cancer-stage IIb (pT2pN1) CT  abdomen/pelvis 06/01/2020-2.9 x 3 cm pancreas tail mass, slight increase in size of 7 mm right middle lobe nodule MRI/MRCP 06/22/2020-solid 3.8 x 3 x 3.1 cm pancreas tail mass with focal occlusion of the splenic vein and dilated venous collaterals, no evidence of metastatic disease, bilateral adrenal adenomas CT renal stone study 08/02/2020-scattered airspace opacities throughout the lung bases-new favored to represent infectious/inflammatory changes EUS 08/16/2020-pancreas tail mass, T2N0, FNA biopsy-suspicious for malignancy CTs 08/27/2020-unchanged right middle lobe nodule, superficial left upper back lesion, no change in pancreas tail mass, occluded splenic vein with collaterals at the splenic hilum, no enlarged abdominal pelvic lymph nodes Distal pancreatectomy/splenectomy 10/17/2020- 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  2/15 lymph nodes positive, positive lymphovascular and perineural invasion, resection margins negative, pT2pN1 Cycle 1 gemcitabine/capecitabine 11/21/2020, day 15 gemcitabine 12/05/2020 Cycle 2 gemcitabine/capecitabine 12/20/2020, day 15 gemcitabine 01/03/2021 Cycle 3 gemcitabine/capecitabine 01/16/2021, day 15 gemcitabine 01/30/2021 Cycle 4 gemcitabine/capecitabine 02/13/2021, day 15 gemcitabine 02/27/2021 Cycle 5 gemcitabine/capecitabine 03/13/2021 (capecitabine dose reduced secondary to hand/foot syndrome), day 15 gemcitabine 03/27/2021 Cycle 6 gemcitabine/capecitabine 04/10/2021 2.   Gastric ulcer on EUS 08/16/2020-H. pylori positive 3.   COVID-19 infection January 2022 4.   COPD 5.   Hypertension 6.   Right abdomen/right leg pain-musculoskeletal? 7.   Right middle lobe nodule on CT 06/01/2020, increased from 2017, stable on CT 11/13/2020 8.   History of tobacco use 9.  Superficial left upper back lesion noted on CT 08/27/2020       Disposition: Amanda Arnold appears unchanged.  She began the  final cycle of capecitabine yesterday.  She will complete day 1 cycle 6 gemcitabine today.  She appears to have positional vertigo.  She will follow-up with Dr. Alain Marion if the symptoms persist.  She will drink adequate fluids and get up slowly.  Amanda Arnold will return for an office visit and day 15 gemcitabine in 2 weeks.  Betsy Coder, MD  04/11/2021  9:14 AM

## 2021-04-11 NOTE — Patient Instructions (Signed)
[facility]  Discharge Instructions: Thank you for choosing [facility] to provide your oncology and hematology care.   If you have a lab appointment with the [facility], please go directly to the [facility] and check in at the registration area.   Wear comfortable clothing and clothing appropriate for easy access to any Portacath or PICC line.   We strive to give you quality time with your provider. You may need to reschedule your appointment if you arrive late (15 or more minutes).  Arriving late affects you and other patients whose appointments are after yours.  Also, if you miss three or more appointments without notifying the office, you may be dismissed from the clinic at the provider's discretion.      For prescription refill requests, have your pharmacy contact our office and allow 72 hours for refills to be completed.    Today you received the following chemotherapy and/or immunotherapy agents GEMZAR      To help prevent nausea and vomiting after your treatment, we encourage you to take your nausea medication as directed.  BELOW ARE SYMPTOMS THAT SHOULD BE REPORTED IMMEDIATELY: *FEVER GREATER THAN 100.4 F (38 C) OR HIGHER *CHILLS OR SWEATING *NAUSEA AND VOMITING THAT IS NOT CONTROLLED WITH YOUR NAUSEA MEDICATION *UNUSUAL SHORTNESS OF BREATH *UNUSUAL BRUISING OR BLEEDING *URINARY PROBLEMS (pain or burning when urinating, or frequent urination) *BOWEL PROBLEMS (unusual diarrhea, constipation, pain near the anus) TENDERNESS IN MOUTH AND THROAT WITH OR WITHOUT PRESENCE OF ULCERS (sore throat, sores in mouth, or a toothache) UNUSUAL RASH, SWELLING OR PAIN  UNUSUAL VAGINAL DISCHARGE OR ITCHING   Items with * indicate a potential emergency and should be followed up as soon as possible or go to the Emergency Department if any problems should occur.  Please show the CHEMOTHERAPY ALERT CARD or IMMUNOTHERAPY ALERT CARD at check-in to the  Emergency Department and triage nurse.  Should you have questions after your visit or need to cancel or reschedule your appointment, please contact [facility]  Dept: [phone]  and follow the prompts.  Office hours are 8:00 a.m. to 4:30 p.m. Monday - Friday. Please note that voicemails left after 4:00 p.m. may not be returned until the following business day.  We are closed weekends and major holidays. You have access to a nurse at all times for urgent questions. Please call the main number to the clinic Dept: [phone] and follow the prompts.   For any non-urgent questions, you may also contact your provider using MyChart. We now offer e-Visits for anyone 30 and older to request care online for non-urgent symptoms. For details visit [email or web address].   Also download the MyChart app! Go to the app store, search "MyChart", open the app, select [facility], and log in with your MyChart username and password.  Due to Covid, a mask is required upon entering the hospital/clinic. If you do not have a mask, one will be given to you upon arrival. For doctor visits, patients may have 1 support person aged 66 or older with them. For treatment visits, patients cannot have anyone with them due to current Covid guidelines and our immunocompromised population.   Gemcitabine injection What is this medication? GEMCITABINE (jem SYE ta been) is a chemotherapy drug. This medicine is used to treat many types of cancer like breast cancer, lung cancer, pancreatic cancer, and ovarian cancer. This medicine may be used for other purposes; ask your health care provider or  pharmacist if you have questions. COMMON BRAND NAME(S): Gemzar, Infugem What should I tell my care team before I take this medication? They need to know if you have any of these conditions: blood disorders infection kidney disease liver disease lung or breathing disease, like asthma recent or ongoing radiation  therapy an unusual or allergic reaction to gemcitabine, other chemotherapy, other medicines, foods, dyes, or preservatives pregnant or trying to get pregnant breast-feeding How should I use this medication? This drug is given as an infusion into a vein. It is administered in a hospital or clinic by a specially trained health care professional. Talk to your pediatrician regarding the use of this medicine in children. Special care may be needed. Overdosage: If you think you have taken too much of this medicine contact a poison control center or emergency room at once. NOTE: This medicine is only for you. Do not share this medicine with others. What if I miss a dose? It is important not to miss your dose. Call your doctor or health care professional if you are unable to keep an appointment. What may interact with this medication? medicines to increase blood counts like filgrastim, pegfilgrastim, sargramostim some other chemotherapy drugs like cisplatin vaccines Talk to your doctor or health care professional before taking any of these medicines: acetaminophen aspirin ibuprofen ketoprofen naproxen This list may not describe all possible interactions. Give your health care provider a list of all the medicines, herbs, non-prescription drugs, or dietary supplements you use. Also tell them if you smoke, drink alcohol, or use illegal drugs. Some items may interact with your medicine. What should I watch for while using this medication? Visit your doctor for checks on your progress. This drug may make you feel generally unwell. This is not uncommon, as chemotherapy can affect healthy cells as well as cancer cells. Report any side effects. Continue your course of treatment even though you feel ill unless your doctor tells you to stop. In some cases, you may be given additional medicines to help with side effects. Follow all directions for their use. Call your doctor or health care professional for  advice if you get a fever, chills or sore throat, or other symptoms of a cold or flu. Do not treat yourself. This drug decreases your body's ability to fight infections. Try to avoid being around people who are sick. This medicine may increase your risk to bruise or bleed. Call your doctor or health care professional if you notice any unusual bleeding. Be careful brushing and flossing your teeth or using a toothpick because you may get an infection or bleed more easily. If you have any dental work done, tell your dentist you are receiving this medicine. Avoid taking products that contain aspirin, acetaminophen, ibuprofen, naproxen, or ketoprofen unless instructed by your doctor. These medicines may hide a fever. Do not become pregnant while taking this medicine or for 6 months after stopping it. Women should inform their doctor if they wish to become pregnant or think they might be pregnant. Men should not father a child while taking this medicine and for 3 months after stopping it. There is a potential for serious side effects to an unborn child. Talk to your health care professional or pharmacist for more information. Do not breast-feed an infant while taking this medicine or for at least 1 week after stopping it. Men should inform their doctors if they wish to father a child. This medicine may lower sperm counts. Talk with your doctor or health care  professional if you are concerned about your fertility. What side effects may I notice from receiving this medication? Side effects that you should report to your doctor or health care professional as soon as possible: allergic reactions like skin rash, itching or hives, swelling of the face, lips, or tongue breathing problems pain, redness, or irritation at site where injected signs and symptoms of a dangerous change in heartbeat or heart rhythm like chest pain; dizziness; fast or irregular heartbeat; palpitations; feeling faint or lightheaded, falls;  breathing problems signs of decreased platelets or bleeding - bruising, pinpoint red spots on the skin, black, tarry stools, blood in the urine signs of decreased red blood cells - unusually weak or tired, feeling faint or lightheaded, falls signs of infection - fever or chills, cough, sore throat, pain or difficulty passing urine signs and symptoms of kidney injury like trouble passing urine or change in the amount of urine signs and symptoms of liver injury like dark yellow or brown urine; general ill feeling or flu-like symptoms; light-colored stools; loss of appetite; nausea; right upper belly pain; unusually weak or tired; yellowing of the eyes or skin swelling of ankles, feet, hands Side effects that usually do not require medical attention (report to your doctor or health care professional if they continue or are bothersome): constipation diarrhea hair loss loss of appetite nausea rash vomiting This list may not describe all possible side effects. Call your doctor for medical advice about side effects. You may report side effects to FDA at 1-800-FDA-1088. Where should I keep my medication? This drug is given in a hospital or clinic and will not be stored at home. NOTE: This sheet is a summary. It may not cover all possible information. If you have questions about this medicine, talk to your doctor, pharmacist, or health care provider.  2022 Elsevier/Gold Standard (2017-09-23 18:06:11)

## 2021-04-11 NOTE — Progress Notes (Signed)
Patient presents for treatment. RN assessment completed along with the following:  Labs/vitals reviewed - Yes, and within treatment parameters.   Weight within 10% of previous measurement - Yes Oncology Treatment Attestation completed for current therapy- Yes, on date 11/08/20 Informed consent completed and reflects current therapy/intent - Yes, on date 11/21/20             Provider progress note reviewed - Today's provider note is not yet available. I reviewed the most recent oncology provider progress note in chart dated 03/27/21. Treatment/Antibody/Supportive plan reviewed - Yes, and there are no adjustments needed for today's treatment. S&H and other orders reviewed - Yes, and there are no additional orders identified. Previous treatment date reviewed - Yes, and the appropriate amount of time has elapsed between treatments.   Patient to proceed with treatment.

## 2021-04-11 NOTE — Telephone Encounter (Signed)
Patient seen by Dr. Sherrill today ? ?Vitals are within treatment parameters. ? ?Labs reviewed by Dr. Sherrill and are within treatment parameters. ? ?Per physician team, patient is ready for treatment and there are NO modifications to the treatment plan.  ?

## 2021-04-11 NOTE — Patient Instructions (Signed)
Implanted Port Home Guide An implanted port is a device that is placed under the skin. It is usually placed in the chest. The device can be used to give IV medicine, to take blood, or for dialysis. You may have an implanted port if: You need IV medicine that would be irritating to the small veins in your hands or arms. You need IV medicines, such as antibiotics, for a long period of time. You need IV nutrition for a long period of time. You need dialysis. When you have a port, your health care provider can choose to use the port instead of veins in your arms for these procedures. You may have fewer limitations when using a port than you would if you used other types of long-term IVs, and you will likely be able to return to normal activities after your incision heals. An implanted port has two main parts: Reservoir. The reservoir is the part where a needle is inserted to give medicines or draw blood. The reservoir is round. After it is placed, it appears as a small, raised area under your skin. Catheter. The catheter is a thin, flexible tube that connects the reservoir to a vein. Medicine that is inserted into the reservoir goes into the catheter and then into the vein. How is my port accessed? To access your port: A numbing cream may be placed on the skin over the port site. Your health care provider will put on a mask and sterile gloves. The skin over your port will be cleaned carefully with a germ-killing soap and allowed to dry. Your health care provider will gently pinch the port and insert a needle into it. Your health care provider will check for a blood return to make sure the port is in the vein and is not clogged. If your port needs to remain accessed to get medicine continuously (constant infusion), your health care provider will place a clear bandage (dressing) over the needle site. The dressing and needle will need to be changed every week, or as told by your health care provider. What  is flushing? Flushing helps keep the port from getting clogged. Follow instructions from your health care provider about how and when to flush the port. Ports are usually flushed with saline solution or a medicine called heparin. The need for flushing will depend on how the port is used: If the port is only used from time to time to give medicines or draw blood, the port may need to be flushed: Before and after medicines have been given. Before and after blood has been drawn. As part of routine maintenance. Flushing may be recommended every 4-6 weeks. If a constant infusion is running, the port may not need to be flushed. Throw away any syringes in a disposal container that is meant for sharp items (sharps container). You can buy a sharps container from a pharmacy, or you can make one by using an empty hard plastic bottle with a cover. How long will my port stay implanted? The port can stay in for as long as your health care provider thinks it is needed. When it is time for the port to come out, a surgery will be done to remove it. The surgery will be similar to the procedure that was done to put the port in. Follow these instructions at home:  Flush your port as told by your health care provider. If you need an infusion over several days, follow instructions from your health care provider about how   to take care of your port site. Make sure you: Wash your hands with soap and water before you change your dressing. If soap and water are not available, use alcohol-based hand sanitizer. Change your dressing as told by your health care provider. Place any used dressings or infusion bags into a plastic bag. Throw that bag in the trash. Keep the dressing that covers the needle clean and dry. Do not get it wet. Do not use scissors or sharp objects near the tube. Keep the tube clamped, unless it is being used. Check your port site every day for signs of infection. Check for: Redness, swelling, or  pain. Fluid or blood. Pus or a bad smell. Protect the skin around the port site. Avoid wearing bra straps that rub or irritate the site. Protect the skin around your port from seat belts. Place a soft pad over your chest if needed. Bathe or shower as told by your health care provider. The site may get wet as long as you are not actively receiving an infusion. Return to your normal activities as told by your health care provider. Ask your health care provider what activities are safe for you. Carry a medical alert card or wear a medical alert bracelet at all times. This will let health care providers know that you have an implanted port in case of an emergency. Get help right away if: You have redness, swelling, or pain at the port site. You have fluid or blood coming from your port site. You have pus or a bad smell coming from the port site. You have a fever. Summary Implanted ports are usually placed in the chest for long-term IV access. Follow instructions from your health care provider about flushing the port and changing bandages (dressings). Take care of the area around your port by avoiding clothing that puts pressure on the area, and by watching for signs of infection. Protect the skin around your port from seat belts. Place a soft pad over your chest if needed. Get help right away if you have a fever or you have redness, swelling, pain, drainage, or a bad smell at the port site. This information is not intended to replace advice given to you by your health care provider. Make sure you discuss any questions you have with your health care provider. Document Revised: 09/19/2020 Document Reviewed: 11/14/2019 Elsevier Patient Education  2022 [employer]vier Inc.  

## 2021-04-12 ENCOUNTER — Telehealth: Payer: Self-pay | Admitting: Internal Medicine

## 2021-04-12 DIAGNOSIS — I2510 Atherosclerotic heart disease of native coronary artery without angina pectoris: Secondary | ICD-10-CM | POA: Diagnosis not present

## 2021-04-12 DIAGNOSIS — I10 Essential (primary) hypertension: Secondary | ICD-10-CM

## 2021-04-12 DIAGNOSIS — I2583 Coronary atherosclerosis due to lipid rich plaque: Secondary | ICD-10-CM

## 2021-04-12 DIAGNOSIS — M1990 Unspecified osteoarthritis, unspecified site: Secondary | ICD-10-CM | POA: Diagnosis not present

## 2021-04-12 DIAGNOSIS — E785 Hyperlipidemia, unspecified: Secondary | ICD-10-CM

## 2021-04-12 NOTE — Telephone Encounter (Signed)
Patient calling in bc she has questions regarding the readings of her BP  Please call [phone]

## 2021-04-15 ENCOUNTER — Ambulatory Visit (INDEPENDENT_AMBULATORY_CARE_PROVIDER_SITE_OTHER): Payer: Medicare Other | Admitting: Internal Medicine

## 2021-04-15 ENCOUNTER — Other Ambulatory Visit: Payer: Self-pay

## 2021-04-15 ENCOUNTER — Encounter: Payer: Self-pay | Admitting: Internal Medicine

## 2021-04-15 VITALS — BP 126/60 | HR 77 | Temp 98.3°F | Ht 62.0 in | Wt 169.0 lb

## 2021-04-15 DIAGNOSIS — R350 Frequency of micturition: Secondary | ICD-10-CM | POA: Diagnosis not present

## 2021-04-15 DIAGNOSIS — I959 Hypotension, unspecified: Secondary | ICD-10-CM | POA: Diagnosis not present

## 2021-04-15 DIAGNOSIS — R911 Solitary pulmonary nodule: Secondary | ICD-10-CM

## 2021-04-15 DIAGNOSIS — R7309 Other abnormal glucose: Secondary | ICD-10-CM

## 2021-04-15 NOTE — Progress Notes (Signed)
Patient ID: Amanda Arnold, female   DOB: Apr 19, 1946, 75 y.o.   MRN: [id number]        Chief Complaint: follow up HTN on beta blocker       HPI:  Amanda Arnold is a 75 y.o. female here with mention that she feels improved oveall after stopping her toprol xl 12.5 bid x 5 days, after some significant worseining fatigue, dizziness, feeling like she might fall with difficulty walking.  Wondering if she needs to restart.  BP at home have been 114-130, but one time it was sbp 149 as well.  Pt denies chest pain, increased sob or doe, wheezing, orthopnea, PND, increased LE swelling, palpitations, dizziness or syncope.   Pt denies polydipsia, polyuria, or new foal neuro s/s,  Has lost wt overall from peak wt just over 200 lbs, now down to 169/  Has hx of pancreatic ca, now improved, last abd/pelvic CT may 2022 without recurrence.  Also had 6 mm RLL nodule, and pt now for CT chest to f/u then see pulmonary.  Also incidentally today with 2-3 days onset dysuria and frequency, but Denies urinary symptoms such as urgency, flank pain, hematuria or n/v, fever, chills.      Wt Readings from Last 3 Encounters:  04/15/21 169 lb (76.7 kg)  04/11/21 168 lb 3.2 oz (76.3 kg)  03/27/21 169 lb 12.8 oz (77 kg)   BP Readings from Last 3 Encounters:  04/15/21 126/60  04/11/21 112/60  03/27/21 (!) 140/58         Past Medical History:  Diagnosis Date   Abdominal pain, epigastric 12/18/2009   ANXIETY 05/25/2006   BRONCHITIS, ACUTE 5/99/3570   Complication of anesthesia     difficulty waking up x 1 - "years ago"   Constipation    COPD 05/25/2006   DEPRESSION 01/02/2006   Diabetes mellitus without complication ([place])    type 2 - diet controlled - no meds   ESOTROPIA, LEFT EYE 01-16-48   Family history of breast cancer 11/26/2020   FIBROMYALGIA 04/02/2006   GERD (gastroesophageal reflux disease)    Headache(784.0) 12/08/2007   History of blood transfusion    HYPERLIPIDEMIA 04/30/2006   HYPERTENSION 05/25/2006    Hypertension    INSOMNIA, PERSISTENT 02/22/2008   Lazy eye    left - no surgery   LOW BACK PAIN 01/27/2007   Migraines    NEURALGIA, 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/2009   Neuropathy    OBSTRUCTIVE SLEEP APNEA 04/17/2010   does not use CPAP   OSTEOARTHRITIS 05/25/2006   pancreatic ca dx'd 06/2020   Polyuria    Pre-diabetes    diet controlled   Psychosomatic disease 2011   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   T M J 05/11/2009   Past Surgical History:  Procedure Laterality Date   BIOPSY  08/16/2020   Procedure: BIOPSY;  Surgeon: Irving Copas., MD;  Location: [facility] ENDOSCOPY;  Service: Endoscopy;;   CHOLECYSTECTOMY     COLONOSCOPY     ESOPHAGOGASTRODUODENOSCOPY (EGD) WITH PROPOFOL N/A 08/16/2020   Procedure: ESOPHAGOGASTRODUODENOSCOPY (EGD) WITH PROPOFOL;  Surgeon: Irving Copas., MD;  Location: [facility] ENDOSCOPY;  Service: Endoscopy;  Laterality: N/A;   EUS N/A 08/16/2020   Procedure: UPPER ENDOSCOPIC ULTRASOUND (EUS) RADIAL;  Surgeon: Rush Landmark Telford Nab., MD;  Location: [place];  Service: Endoscopy;  Laterality: N/A;   EYE SURGERY Bilateral    cataract with lens implant   FINE NEEDLE ASPIRATION  08/16/2020   Procedure: FINE NEEDLE ASPIRATION (FNA) LINEAR;  Surgeon: Irving Copas.,  MD;  Location: [place];  Service: Endoscopy;;   MASS EXCISION Left 10/06/2016   Procedure: EXCISION LEFT BUTTOCKS MASS;  Surgeon: Alphonsa Overall, MD;  Location: [place];  Service: General;  Laterality: Left;   PORTACATH PLACEMENT N/A 11/14/2020   Procedure: INSERTION PORT-A-CATH;  Surgeon: [name], MD;  Location: [place];  Service: General;  Laterality: N/A;   XI ROBOTIC ASSISTED LAPAROSCOPIC DISTAL PANCREATECTOMY N/A 10/17/2020   Procedure: XI ROBOTIC ASSISTED DISTAL PANCREATECTOMY AND SPLENECTOMY;  Surgeon: [name], MD;  Location: [place];  Service: General;  Laterality: N/A;    reports that she quit smoking about 7 years ago. Her smoking use included cigarettes. Her smokeless  tobacco use includes snuff. She reports that she does not drink alcohol and does not use drugs. family history includes Breast cancer in her maternal aunt; Cancer in her maternal uncle; Cervical cancer in her mother; Heart attack in her father; Heart disease in her mother; Hypertension in her brother; Leukemia in her brother; Skin cancer in her daughter and mother. Allergies  Allergen Reactions   Morphine And Related Shortness Of Breath   Bupropion Hcl Other (See Comments)    "does not feel right"   Ezetimibe Other (See Comments)    REACTION: legs burning   Metformin And Related Diarrhea    Diarrhea w/XR or regular    Naproxen     Upset stomach, diarrhea   Trazodone Hcl Swelling   Tramadol Hcl Palpitations    REACTION: palpitations   Current Outpatient Medications on File Prior to Visit  Medication Sig Dispense Refill   ALPRAZolam (XANAX) 0.5 MG tablet TAKE 1 TABLET(0.5 MG) BY MOUTH TWICE DAILY AS NEEDED FOR ANXIETY (Patient taking differently: Take 0.25 mg by mouth at bedtime as needed. TAKE 1 TABLET(0.5 MG) BY MOUTH TWICE DAILY AS NEEDED FOR ANXIETY) 60 tablet 2   capecitabine (XELODA) 500 MG tablet Take 2 tablets (1,000 mg total) by mouth 2 (two) times daily after a meal. Take for 21 days, then hold for 7 days. Repeat every 28 days. 84 tablet 0   HYDROcodone-acetaminophen (NORCO) 7.5-325 MG tablet Take 1 tablet by mouth every 8 (eight) hours as needed for severe pain. 60 tablet 0   gabapentin (NEURONTIN) 300 MG capsule Take 1 capsule (300 mg total) by mouth 3 (three) times daily. (Patient not taking: No sig reported) 90 capsule 5   lidocaine-prilocaine (EMLA) cream Apply 1 application topically as directed. Apply to port site 2 hours prior to stick and cover with plastic wrap (Patient not taking: Reported on 04/15/2021) 30 g 5   omeprazole (PRILOSEC) 40 MG capsule TAKE 1 CAPSULE(40 MG) BY MOUTH TWICE DAILY BEFORE A MEAL (Patient not taking: No sig reported) 60 capsule 6   ondansetron  (ZOFRAN) 8 MG tablet Take 1 tablet (8 mg total) by mouth every 8 (eight) hours as needed for nausea or vomiting. Start 72 hours after IV chemo infusion (Patient not taking: No sig reported) 30 tablet 1   prochlorperazine (COMPAZINE) 10 MG tablet Take 1 tablet (10 mg total) by mouth every 6 (six) hours as needed for nausea. (Patient not taking: No sig reported) 60 tablet 1   rosuvastatin (CRESTOR) 10 MG tablet TAKE 1 TABLET(10 MG) BY MOUTH DAILY (Patient not taking: Reported on 04/15/2021) 90 tablet 0   No current facility-administered medications on file prior to visit.        ROS:  All others reviewed and negative.  Objective  PE:  BP 126/60 (BP Location: Right Arm, Patient Position: Sitting, Cuff Size: Large)   Pulse 77   Temp 98.3 F (36.8 C) (Oral)   Ht 5\' 2"  (1.575 m)   Wt 169 lb (76.7 kg)   LMP  (LMP Unknown)   SpO2 98%   BMI 30.91 kg/m                 Constitutional: Pt appears in NAD               HENT: Head: NCAT.                Right Ear: External ear normal.                 Left Ear: External ear normal.                Eyes: . Pupils are equal, round, and reactive to light. Conjunctivae and EOM are normal               Nose: without d/c or deformity               Neck: Neck supple. Gross normal ROM               Cardiovascular: Normal rate and regular rhythm.                 Pulmonary/Chest: Effort normal and breath sounds without rales or wheezing.                Abd:  Soft, NT, ND, + BS, no organomegaly               Neurological: Pt is alert. At baseline orientation, motor grossly intact               Skin: Skin is warm. No rashes, no other new lesions, LE edema - none               Psychiatric: Pt behavior is normal without agitation   Micro: none  Cardiac tracings I have personally interpreted today:  none  Pertinent Radiological findings (summarize): none   Lab Results  Component Value Date   WBC 8.2 04/11/2021   HGB 12.5 04/11/2021   HCT 35.8 (L)  04/11/2021   PLT 202 04/11/2021   GLUCOSE 140 (H) 04/11/2021   CHOL 167 09/17/2017   TRIG 92.0 09/17/2017   HDL 53.00 09/17/2017   LDLCALC 96 09/17/2017   ALT 15 04/11/2021   AST 32 04/11/2021   NA 138 04/11/2021   K 3.9 04/11/2021   CL 104 04/11/2021   CREATININE 0.57 04/11/2021   BUN 18 04/11/2021   CO2 26 04/11/2021   TSH 1.24 02/16/2020   INR 1.1 (H) 08/22/2020   HGBA1C 5.9 (H) 10/17/2020   Assessment/Plan:  Amanda Arnold is a 75 y.o. White or Caucasian [1] female with  has a past medical history of Abdominal pain, epigastric (12/18/2009), ANXIETY (05/25/2006), BRONCHITIS, ACUTE (8/56/3149), Complication of anesthesia, Constipation, COPD (05/25/2006), DEPRESSION (01/02/2006), Diabetes mellitus without complication ([place]), ESOTROPIA, LEFT EYE (1946-06-30), Family history of breast cancer (11/26/2020), FIBROMYALGIA (04/02/2006), GERD (gastroesophageal reflux disease), Headache(784.0) (12/08/2007), History of blood transfusion, HYPERLIPIDEMIA (04/30/2006), HYPERTENSION (05/25/2006), Hypertension, INSOMNIA, PERSISTENT (02/22/2008), Lazy eye, LOW BACK PAIN (01/27/2007), Migraines, NEURALGIA, TRIGEMINAL (05/15/2009), Neuropathy, OBSTRUCTIVE SLEEP APNEA (04/17/2010), OSTEOARTHRITIS (05/25/2006), pancreatic ca (dx'd 06/2020), Polyuria, Pre-diabetes, Psychosomatic disease (2011), Somatization disorder (04/10/2010), and T M J (05/11/2009).  Hypotension Mild without fall, agree with pt to d/c toprol  and not restart, no need change to other med for now and continue to monitor BP at home in light of overall wt loss and hx of malignancy, keep hydrated, f/u with PCP in 1 mo  Urinary frequency Exam benign but incidental symptoms, for urine studies,  to f/u any worsening symptoms or concerns  HYPERGLYCEMIA Lab Results  Component Value Date   HGBA1C 5.9 (H) 10/17/2020   Stable, pt to continue current medical treatment  - diet   Pulmonary nodule dw pt, ok to f/u chest ct and pulmonary as planned to f/u  any other issue or RLL nodule worsening  Followup: Return in about 4 weeks (around 05/13/2021) for with Dr Alain Marion.  Cathlean Cower, MD 04/15/2021 8:33 PM [facility]

## 2021-04-15 NOTE — Patient Instructions (Signed)
Ok to stay off the Toprol XL (metoprolol) as you have done for the last 5 days  Please keep hydrated, and check your BP every 1-2 days at home, and follow up wit Dr Alain Marion in 1 month  Your BP ideally should be 120-130 on the top number, but it is ok for 10 pt higher or lower, if you do not feel worsening dizziness or falls.    Please continue all other medications as before, and refills have been done if requested.  Please have the pharmacy call with any other refills you may need.  Please keep your appointments with your specialists as you may have planned  Please go to the LAB at the blood drawing area for the tests to be done - just the urine testing today  You will be contacted by phone if any changes need to be made immediately.  Otherwise, you will receive a letter about your results with an explanation, but please check with MyChart first.  Please remember to sign up for MyChart if you have not done so, as this will be important to you in the future with finding out test results, communicating by private email, and scheduling acute appointments online when needed.

## 2021-04-15 NOTE — Assessment & Plan Note (Signed)
Mild without fall, agree with pt to d/c toprol and not restart, no need change to other med for now and continue to monitor BP at home in light of overall wt loss and hx of malignancy, keep hydrated, f/u with PCP in 1 mo

## 2021-04-15 NOTE — Assessment & Plan Note (Signed)
Exam benign but incidental symptoms, for urine studies,  to f/u any worsening symptoms or concerns

## 2021-04-15 NOTE — Assessment & Plan Note (Signed)
Lab Results  Component Value Date   HGBA1C 5.9 (H) 10/17/2020   Stable, pt to continue current medical treatment  - diet

## 2021-04-15 NOTE — Assessment & Plan Note (Signed)
dw pt, ok to f/u chest ct and pulmonary as planned to f/u any other issue or RLL nodule worsening

## 2021-04-15 NOTE — Telephone Encounter (Signed)
[facility] FYI 9.30.22....  Caller states she her blood pressure is low current reading was 120/65 at noon. She took it again at 2:00p her current reading was 126/84. She needs advised as when to take her blood pressure medication since normally half in morning and at night. She has not been taking her blood pressure med. She has cancer and her cancer doctor told her to call her PCP because her BP has been low. She has been holding her BP meds and BP is normal right now she is will continue to hold until calls the office on Monday for an appt. If BP gets to 150/90 at any time over the weekend she will call us back.   Advised to see PCP within 3 days. Patient understood and was seen on 05/12/21

## 2021-04-16 ENCOUNTER — Encounter: Payer: Self-pay | Admitting: Internal Medicine

## 2021-04-16 LAB — URINALYSIS, ROUTINE W REFLEX MICROSCOPIC
Bilirubin Urine: NEGATIVE
Hgb urine dipstick: NEGATIVE
Ketones, ur: NEGATIVE
Leukocytes,Ua: NEGATIVE
Nitrite: NEGATIVE
RBC / HPF: NONE SEEN (ref 0–?)
Specific Gravity, Urine: 1.02 (ref 1.000–1.030)
Total Protein, Urine: NEGATIVE
Urine Glucose: NEGATIVE
Urobilinogen, UA: 0.2 (ref 0.0–1.0)
pH: 6 (ref 5.0–8.0)

## 2021-04-16 LAB — URINE CULTURE: Result:: NO GROWTH

## 2021-04-17 ENCOUNTER — Telehealth: Payer: Self-pay

## 2021-04-17 NOTE — Chronic Care Management (AMB) (Signed)
    Chronic Care Management Pharmacy Assistant   Name: Amanda Arnold  MRN: [id number] DOB: 02-28-1946  Amanda Arnold is an 75 y.o. year old female who presents for his follow-up CCM visit with the clinical pharmacist.  Reason for Encounter: Disease State General   Recent office visits:   04/15/21 Biagio Borg MD- pt was seen for hypotension, Labs were ordered and provider discontinued Metoprolol Succinate 25 mg. Follow up in 4 weeks.   Recent consult visits:   04/11/21- pt seen for oncology treatment for cancer of pancreas. Orders were placed and pt advised to f/u in 2 weeks .  03/27/21 Ned Card NP- pt seen for oncology treatment for cancer of pancreas.  Labs were ordered and provider discontinued Diclofenac Sodium 2g. Pt will f/u in 2 weeks.  Hospital visits:  None in previous 6 months  Medications: Outpatient Encounter Medications as of 04/17/2021  Medication Sig Note   ALPRAZolam (XANAX) 0.5 MG tablet TAKE 1 TABLET(0.5 MG) BY MOUTH TWICE DAILY AS NEEDED FOR ANXIETY (Patient taking differently: Take 0.25 mg by mouth at bedtime as needed. TAKE 1 TABLET(0.5 MG) BY MOUTH TWICE DAILY AS NEEDED FOR ANXIETY)    capecitabine (XELODA) 500 MG tablet Take 2 tablets (1,000 mg total) by mouth 2 (two) times daily after a meal. Take for 21 days, then hold for 7 days. Repeat every 28 days.    gabapentin (NEURONTIN) 300 MG capsule Take 1 capsule (300 mg total) by mouth 3 (three) times daily. (Patient not taking: No sig reported)    HYDROcodone-acetaminophen (NORCO) 7.5-325 MG tablet Take 1 tablet by mouth every 8 (eight) hours as needed for severe pain.    lidocaine-prilocaine (EMLA) cream Apply 1 application topically as directed. Apply to port site 2 hours prior to stick and cover with plastic wrap (Patient not taking: Reported on 04/15/2021)    omeprazole (PRILOSEC) 40 MG capsule TAKE 1 CAPSULE(40 MG) BY MOUTH TWICE DAILY BEFORE A MEAL (Patient not taking: No sig reported) 03/27/2021: Not  taking due to Xeloda   ondansetron (ZOFRAN) 8 MG tablet Take 1 tablet (8 mg total) by mouth every 8 (eight) hours as needed for nausea or vomiting. Start 72 hours after IV chemo infusion (Patient not taking: No sig reported)    prochlorperazine (COMPAZINE) 10 MG tablet Take 1 tablet (10 mg total) by mouth every 6 (six) hours as needed for nausea. (Patient not taking: No sig reported)    rosuvastatin (CRESTOR) 10 MG tablet TAKE 1 TABLET(10 MG) BY MOUTH DAILY (Patient not taking: Reported on 04/15/2021)    No facility-administered encounter medications on file as of 04/17/2021.    Have you had any problems recently with your health? Pt stated she has not had any new health issues.  Have you had any problems with your pharmacy? Pt stated there has been no issues w/ getting or paying for her prescriptions at this time.  What issues or side effects are you having with your medications? Per pt she is not experiencing any side effects thst she is aware of.  What would you like me to pass along to [facility] for them to help you with?  Pt stated there is nothing at the moment.  What can we do to take care of you better?  Pt stated there is nothing at the moment.  Star Rating Drugs: rosuvastatin (CRESTOR) 10 MG tablet last filled 01/29/21 90 DS  [facility] Clinical Pharmacist Assistant [phone]

## 2021-04-20 ENCOUNTER — Other Ambulatory Visit: Payer: Self-pay | Admitting: Oncology

## 2021-04-23 ENCOUNTER — Other Ambulatory Visit: Payer: Self-pay

## 2021-04-23 ENCOUNTER — Ambulatory Visit (INDEPENDENT_AMBULATORY_CARE_PROVIDER_SITE_OTHER)
Admission: RE | Admit: 2021-04-23 | Discharge: 2021-04-23 | Disposition: A | Discharge status: Home / Self Care | Payer: Medicare Other | Source: Ambulatory Visit | Attending: Cardiology | Admitting: Cardiology

## 2021-04-23 DIAGNOSIS — R911 Solitary pulmonary nodule: Secondary | ICD-10-CM

## 2021-04-23 DIAGNOSIS — F17200 Nicotine dependence, unspecified, uncomplicated: Secondary | ICD-10-CM

## 2021-04-23 DIAGNOSIS — J439 Emphysema, unspecified: Secondary | ICD-10-CM | POA: Diagnosis not present

## 2021-04-23 DIAGNOSIS — I700 Atherosclerosis of aorta: Secondary | ICD-10-CM | POA: Diagnosis not present

## 2021-04-24 ENCOUNTER — Inpatient Hospital Stay: Payer: Medicare Other | Attending: Oncology

## 2021-04-24 ENCOUNTER — Inpatient Hospital Stay: Payer: Medicare Other

## 2021-04-24 ENCOUNTER — Inpatient Hospital Stay: Payer: Medicare Other | Admitting: Nurse Practitioner

## 2021-04-24 ENCOUNTER — Encounter: Payer: Self-pay | Admitting: Nurse Practitioner

## 2021-04-24 VITALS — BP 125/68 | HR 83 | Temp 97.7°F | Resp 18 | Ht 62.0 in | Wt 167.2 lb

## 2021-04-24 DIAGNOSIS — C252 Malignant neoplasm of tail of pancreas: Secondary | ICD-10-CM

## 2021-04-24 DIAGNOSIS — Z5111 Encounter for antineoplastic chemotherapy: Secondary | ICD-10-CM | POA: Diagnosis not present

## 2021-04-24 DIAGNOSIS — I10 Essential (primary) hypertension: Secondary | ICD-10-CM | POA: Diagnosis not present

## 2021-04-24 DIAGNOSIS — Z79899 Other long term (current) drug therapy: Secondary | ICD-10-CM | POA: Diagnosis not present

## 2021-04-24 DIAGNOSIS — J449 Chronic obstructive pulmonary disease, unspecified: Secondary | ICD-10-CM | POA: Diagnosis not present

## 2021-04-24 DIAGNOSIS — K259 Gastric ulcer, unspecified as acute or chronic, without hemorrhage or perforation: Secondary | ICD-10-CM | POA: Diagnosis not present

## 2021-04-24 LAB — CBC WITH DIFFERENTIAL (CANCER CENTER ONLY)
Abs Immature Granulocytes: 0.02 10*3/uL (ref 0.00–0.07)
Basophils Absolute: 0.1 10*3/uL (ref 0.0–0.1)
Basophils Relative: 1 %
Eosinophils Absolute: 0.1 10*3/uL (ref 0.0–0.5)
Eosinophils Relative: 1 %
HCT: 37.4 % (ref 36.0–46.0)
Hemoglobin: 12.7 g/dL (ref 12.0–15.0)
Immature Granulocytes: 0 %
Lymphocytes Relative: 30 %
Lymphs Abs: 2.2 10*3/uL (ref 0.7–4.0)
MCH: 38.3 pg — ABNORMAL HIGH (ref 26.0–34.0)
MCHC: 34 g/dL (ref 30.0–36.0)
MCV: 112.7 fL — ABNORMAL HIGH (ref 80.0–100.0)
Monocytes Absolute: 1 10*3/uL (ref 0.1–1.0)
Monocytes Relative: 14 %
Neutro Abs: 3.9 10*3/uL (ref 1.7–7.7)
Neutrophils Relative %: 54 %
Platelet Count: 200 10*3/uL (ref 150–400)
RBC: 3.32 MIL/uL — ABNORMAL LOW (ref 3.87–5.11)
RDW: 16 % — ABNORMAL HIGH (ref 11.5–15.5)
WBC Count: 7.2 10*3/uL (ref 4.0–10.5)
nRBC: 0 % (ref 0.0–0.2)

## 2021-04-24 LAB — CMP (CANCER CENTER ONLY)
ALT: 14 U/L (ref 0–44)
AST: 26 U/L (ref 15–41)
Albumin: 4.2 g/dL (ref 3.5–5.0)
Alkaline Phosphatase: 66 U/L (ref 38–126)
Anion gap: 9 (ref 5–15)
BUN: 15 mg/dL (ref 8–23)
CO2: 27 mmol/L (ref 22–32)
Calcium: 9.3 mg/dL (ref 8.9–10.3)
Chloride: 102 mmol/L (ref 98–111)
Creatinine: 0.53 mg/dL (ref 0.44–1.00)
GFR, Estimated: >60 mL/min (ref >60)
Glucose, Bld: 137 mg/dL — ABNORMAL HIGH (ref 70–99)
Potassium: 4.1 mmol/L (ref 3.5–5.1)
Sodium: 138 mmol/L (ref 135–145)
Total Bilirubin: 0.8 mg/dL (ref 0.3–1.2)
Total Protein: 6.8 g/dL (ref 6.5–8.1)

## 2021-04-24 MED ORDER — SODIUM CHLORIDE 0.9% FLUSH
10.0000 mL | INTRAVENOUS | Status: DC | PRN
  Administered 2021-04-24: 10 mL

## 2021-04-24 MED ORDER — SODIUM CHLORIDE 0.9 % IV SOLN
1000.0000 mg/m2 | Freq: Once | INTRAVENOUS | Status: AC
  Administered 2021-04-24: 1824 mg via INTRAVENOUS
  Filled 2021-04-24: qty 47.97

## 2021-04-24 MED ORDER — HEPARIN SOD (PORK) LOCK FLUSH 100 UNIT/ML IV SOLN
500.0000 [IU] | Freq: Once | INTRAVENOUS | Status: AC | PRN
  Administered 2021-04-24: 500 [IU]

## 2021-04-24 MED ORDER — PROCHLORPERAZINE MALEATE 10 MG PO TABS
10.0000 mg | ORAL_TABLET | Freq: Once | ORAL | Status: AC
  Administered 2021-04-24: 10 mg via ORAL
  Filled 2021-04-24: qty 1

## 2021-04-24 MED ORDER — SODIUM CHLORIDE 0.9 % IV SOLN: Freq: Once | INTRAVENOUS | Status: AC

## 2021-04-24 NOTE — Progress Notes (Addendum)
  [facility] OFFICE PROGRESS NOTE   Diagnosis:  Pancreas cancer  INTERVAL HISTORY:   Amanda Arnold returns as scheduled.  She began cycle 6 gemcitabine/capecitabine 04/10/2021.  She denies nausea/vomiting.  No mouth sores.  No diarrhea.  No hand or foot pain or redness.  She noted an area of erythema on one of her hands after the most recent treatment.  The erythema resolved over a few days.  No fever.  Objective:  Vital signs in last 24 hours:  Blood pressure 125/68, pulse 83, temperature 97.7 F (36.5 C), temperature source Oral, resp. rate 18, height 5\' 2"  (1.575 m), weight 167 lb 3.2 oz (75.8 kg), SpO2 100 %.    HEENT: No thrush or ulcers. Resp: Lungs clear bilaterally. Cardio: Regular rate and rhythm. GI: Abdomen soft and nontender.  No hepatosplenomegaly. Vascular: No leg edema. Skin: Palms without erythema.  Soles are dry appearing. Port-A-Cath without erythema   Lab Results:  Lab Results  Component Value Date   WBC 7.2 04/24/2021   HGB 12.7 04/24/2021   HCT 37.4 04/24/2021   MCV 112.7 (H) 04/24/2021   PLT 200 04/24/2021   NEUTROABS 3.9 04/24/2021    Imaging:  No results found.  Medications: I have reviewed the patient's current medications.  Assessment/Plan: Pancreas cancer-stage IIb (pT2pN1) CT abdomen/pelvis 06/01/2020-2.9 x 3 cm pancreas tail mass, slight increase in size of 7 mm right middle lobe nodule MRI/MRCP 06/22/2020-solid 3.8 x 3 x 3.1 cm pancreas tail mass with focal occlusion of the splenic vein and dilated venous collaterals, no evidence of metastatic disease, bilateral adrenal adenomas CT renal stone study 08/02/2020-scattered airspace opacities throughout the lung bases-new favored to represent infectious/inflammatory changes EUS 08/16/2020-pancreas tail mass, T2N0, FNA biopsy-suspicious for malignancy CTs 08/27/2020-unchanged right middle lobe nodule, superficial left upper back lesion, no change in pancreas tail mass, occluded  splenic vein with collaterals at the splenic hilum, no enlarged abdominal pelvic lymph nodes Distal pancreatectomy/splenectomy 10/17/2020- 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  2/15 lymph nodes positive, positive lymphovascular and perineural invasion, resection margins negative, pT2pN1 Cycle 1 gemcitabine/capecitabine 11/21/2020, day 15 gemcitabine 12/05/2020 Cycle 2 gemcitabine/capecitabine 12/20/2020, day 15 gemcitabine 01/03/2021 Cycle 3 gemcitabine/capecitabine 01/16/2021, day 15 gemcitabine 01/30/2021 Cycle 4 gemcitabine/capecitabine 02/13/2021, day 15 gemcitabine 02/27/2021 Cycle 5 gemcitabine/capecitabine 03/13/2021 (capecitabine dose reduced secondary to hand/foot syndrome), day 15 gemcitabine 03/27/2021 Cycle 6 gemcitabine/capecitabine 04/10/2021, day 15 gemcitabine 04/24/2021 2.   Gastric ulcer on EUS 08/16/2020-H. pylori positive 3.   COVID-19 infection January 2022 4.   COPD 5.   Hypertension 6.   Right abdomen/right leg pain-musculoskeletal? 7.   Right middle lobe nodule on CT 06/01/2020, increased from 2017, stable on CT 11/13/2020 8.   History of tobacco use 9.   Superficial left upper back lesion noted on CT 08/27/2020  Disposition: Amanda Arnold appears stable.  She is completing the sixth and final cycle of gemcitabine/capecitabine.  She continues to tolerate well.  Plan to proceed with day 15 gemcitabine today as scheduled.  CBC and chemistry panel reviewed.  Labs adequate to proceed with treatment.  She will contact the office if she develops a rash after today's treatment.  She will return for lab and follow-up in approximately 4 weeks.    Ned Card ANP/GNP-BC   04/24/2021  9:37 AM

## 2021-04-24 NOTE — Progress Notes (Signed)
Patient presents for treatment. RN assessment completed along with the following:  Labs/vitals reviewed - Yes, and within treatment parameters.   Weight within 10% of previous measurement - Yes Oncology Treatment Attestation completed for current therapy- Yes, on date 11/08/2020 Informed consent completed and reflects current therapy/intent - Yes, on date 11/21/2020             Provider progress note reviewed - Today's provider note is not yet available. I reviewed the most recent oncology provider progress note in chart dated 03/27/2021. Treatment/Antibody/Supportive plan reviewed - Yes, and there are no adjustments needed for today's treatment. S&H and other orders reviewed - Yes, and there are no additional orders identified. Previous treatment date reviewed - Yes, and the appropriate amount of time has elapsed between treatments. Clinic Hand Off Received from - Yes, Lattie Haw, NP  Patient to proceed with treatment.

## 2021-04-24 NOTE — Patient Instructions (Signed)
[facility]   Discharge Instructions: Thank you for choosing [facility] to provide your oncology and hematology care.   If you have a lab appointment with the [facility], please go directly to the [facility] and check in at the registration area.   Wear comfortable clothing and clothing appropriate for easy access to any Portacath or PICC line.   We strive to give you quality time with your provider. You may need to reschedule your appointment if you arrive late (15 or more minutes).  Arriving late affects you and other patients whose appointments are after yours.  Also, if you miss three or more appointments without notifying the office, you may be dismissed from the clinic at the provider's discretion.      For prescription refill requests, have your pharmacy contact our office and allow 72 hours for refills to be completed.    Today you received the following chemotherapy and/or immunotherapy agents Gemcitabine (GEMZAR).      To help prevent nausea and vomiting after your treatment, we encourage you to take your nausea medication as directed.  BELOW ARE SYMPTOMS THAT SHOULD BE REPORTED IMMEDIATELY: *FEVER GREATER THAN 100.4 F (38 C) OR HIGHER *CHILLS OR SWEATING *NAUSEA AND VOMITING THAT IS NOT CONTROLLED WITH YOUR NAUSEA MEDICATION *UNUSUAL SHORTNESS OF BREATH *UNUSUAL BRUISING OR BLEEDING *URINARY PROBLEMS (pain or burning when urinating, or frequent urination) *BOWEL PROBLEMS (unusual diarrhea, constipation, pain near the anus) TENDERNESS IN MOUTH AND THROAT WITH OR WITHOUT PRESENCE OF ULCERS (sore throat, sores in mouth, or a toothache) UNUSUAL RASH, SWELLING OR PAIN  UNUSUAL VAGINAL DISCHARGE OR ITCHING   Items with * indicate a potential emergency and should be followed up as soon as possible or go to the Emergency Department if any problems should occur.  Please show the CHEMOTHERAPY ALERT CARD or IMMUNOTHERAPY ALERT CARD at  check-in to the Emergency Department and triage nurse.  Should you have questions after your visit or need to cancel or reschedule your appointment, please contact [facility]  Dept: [phone]  and follow the prompts.  Office hours are 8:00 a.m. to 4:30 p.m. Monday - Friday. Please note that voicemails left after 4:00 p.m. may not be returned until the following business day.  We are closed weekends and major holidays. You have access to a nurse at all times for urgent questions. Please call the main number to the clinic Dept: [phone] and follow the prompts.   For any non-urgent questions, you may also contact your provider using MyChart. We now offer e-Visits for anyone 9 and older to request care online for non-urgent symptoms. For details visit [email or web address].   Also download the MyChart app! Go to the app store, search "MyChart", open the app, select [facility], and log in with your MyChart username and password.  Due to Covid, a mask is required upon entering the hospital/clinic. If you do not have a mask, one will be given to you upon arrival. For doctor visits, patients may have 1 support person aged 28 or older with them. For treatment visits, patients cannot have anyone with them due to current Covid guidelines and our immunocompromised population.   Gemcitabine injection What is this medication? GEMCITABINE (jem SYE ta been) is a chemotherapy drug. This medicine is used to treat many types of cancer like breast cancer, lung cancer, pancreatic cancer, and ovarian cancer. This medicine may be used for other purposes; ask your health care  provider or pharmacist if you have questions. COMMON BRAND NAME(S): Gemzar, Infugem What should I tell my care team before I take this medication? They need to know if you have any of these conditions: blood disorders infection kidney disease liver disease lung or breathing disease, like asthma recent or  ongoing radiation therapy an unusual or allergic reaction to gemcitabine, other chemotherapy, other medicines, foods, dyes, or preservatives pregnant or trying to get pregnant breast-feeding How should I use this medication? This drug is given as an infusion into a vein. It is administered in a hospital or clinic by a specially trained health care professional. Talk to your pediatrician regarding the use of this medicine in children. Special care may be needed. Overdosage: If you think you have taken too much of this medicine contact a poison control center or emergency room at once. NOTE: This medicine is only for you. Do not share this medicine with others. What if I miss a dose? It is important not to miss your dose. Call your doctor or health care professional if you are unable to keep an appointment. What may interact with this medication? medicines to increase blood counts like filgrastim, pegfilgrastim, sargramostim some other chemotherapy drugs like cisplatin vaccines Talk to your doctor or health care professional before taking any of these medicines: acetaminophen aspirin ibuprofen ketoprofen naproxen This list may not describe all possible interactions. Give your health care provider a list of all the medicines, herbs, non-prescription drugs, or dietary supplements you use. Also tell them if you smoke, drink alcohol, or use illegal drugs. Some items may interact with your medicine. What should I watch for while using this medication? Visit your doctor for checks on your progress. This drug may make you feel generally unwell. This is not uncommon, as chemotherapy can affect healthy cells as well as cancer cells. Report any side effects. Continue your course of treatment even though you feel ill unless your doctor tells you to stop. In some cases, you may be given additional medicines to help with side effects. Follow all directions for their use. Call your doctor or health care  professional for advice if you get a fever, chills or sore throat, or other symptoms of a cold or flu. Do not treat yourself. This drug decreases your body's ability to fight infections. Try to avoid being around people who are sick. This medicine may increase your risk to bruise or bleed. Call your doctor or health care professional if you notice any unusual bleeding. Be careful brushing and flossing your teeth or using a toothpick because you may get an infection or bleed more easily. If you have any dental work done, tell your dentist you are receiving this medicine. Avoid taking products that contain aspirin, acetaminophen, ibuprofen, naproxen, or ketoprofen unless instructed by your doctor. These medicines may hide a fever. Do not become pregnant while taking this medicine or for 6 months after stopping it. Women should inform their doctor if they wish to become pregnant or think they might be pregnant. Men should not father a child while taking this medicine and for 3 months after stopping it. There is a potential for serious side effects to an unborn child. Talk to your health care professional or pharmacist for more information. Do not breast-feed an infant while taking this medicine or for at least 1 week after stopping it. Men should inform their doctors if they wish to father a child. This medicine may lower sperm counts. Talk with your doctor or  health care professional if you are concerned about your fertility. What side effects may I notice from receiving this medication? Side effects that you should report to your doctor or health care professional as soon as possible: allergic reactions like skin rash, itching or hives, swelling of the face, lips, or tongue breathing problems pain, redness, or irritation at site where injected signs and symptoms of a dangerous change in heartbeat or heart rhythm like chest pain; dizziness; fast or irregular heartbeat; palpitations; feeling faint or  lightheaded, falls; breathing problems signs of decreased platelets or bleeding - bruising, pinpoint red spots on the skin, black, tarry stools, blood in the urine signs of decreased red blood cells - unusually weak or tired, feeling faint or lightheaded, falls signs of infection - fever or chills, cough, sore throat, pain or difficulty passing urine signs and symptoms of kidney injury like trouble passing urine or change in the amount of urine signs and symptoms of liver injury like dark yellow or brown urine; general ill feeling or flu-like symptoms; light-colored stools; loss of appetite; nausea; right upper belly pain; unusually weak or tired; yellowing of the eyes or skin swelling of ankles, feet, hands Side effects that usually do not require medical attention (report to your doctor or health care professional if they continue or are bothersome): constipation diarrhea hair loss loss of appetite nausea rash vomiting This list may not describe all possible side effects. Call your doctor for medical advice about side effects. You may report side effects to FDA at 1-800-FDA-1088. Where should I keep my medication? This drug is given in a hospital or clinic and will not be stored at home. NOTE: This sheet is a summary. It may not cover all possible information. If you have questions about this medicine, talk to your doctor, pharmacist, or health care provider.  2022 Elsevier/Gold Standard (2017-09-23 18:06:11)

## 2021-04-27 ENCOUNTER — Other Ambulatory Visit: Payer: Self-pay | Admitting: Cardiology

## 2021-04-29 ENCOUNTER — Other Ambulatory Visit: Payer: Self-pay

## 2021-04-29 ENCOUNTER — Ambulatory Visit (INDEPENDENT_AMBULATORY_CARE_PROVIDER_SITE_OTHER): Payer: Medicare Other | Admitting: Internal Medicine

## 2021-04-29 ENCOUNTER — Encounter: Payer: Self-pay | Admitting: Internal Medicine

## 2021-04-29 ENCOUNTER — Other Ambulatory Visit (HOSPITAL_COMMUNITY): Payer: Self-pay

## 2021-04-29 DIAGNOSIS — I10 Essential (primary) hypertension: Secondary | ICD-10-CM | POA: Diagnosis not present

## 2021-04-29 DIAGNOSIS — I959 Hypotension, unspecified: Secondary | ICD-10-CM | POA: Diagnosis not present

## 2021-04-29 DIAGNOSIS — R7309 Other abnormal glucose: Secondary | ICD-10-CM | POA: Diagnosis not present

## 2021-04-29 DIAGNOSIS — I2583 Coronary atherosclerosis due to lipid rich plaque: Secondary | ICD-10-CM | POA: Diagnosis not present

## 2021-04-29 DIAGNOSIS — C252 Malignant neoplasm of tail of pancreas: Secondary | ICD-10-CM

## 2021-04-29 DIAGNOSIS — I2510 Atherosclerotic heart disease of native coronary artery without angina pectoris: Secondary | ICD-10-CM | POA: Diagnosis not present

## 2021-04-29 NOTE — Assessment & Plan Note (Signed)
Cont on Crestor No angina

## 2021-04-29 NOTE — Assessment & Plan Note (Signed)
Overall better 

## 2021-04-29 NOTE — Assessment & Plan Note (Signed)
  Hydrate well On Chemotherapy. She is on gemcitabine/capecitabine. She denies nausea/vomiting.  No mouth sores.  No diarrhea.

## 2021-04-29 NOTE — Assessment & Plan Note (Signed)
Better off Toprol Hydrate well On Chemotherapy. She is on gemcitabine/capecitabine. She denies nausea/vomiting.  No mouth sores.  No diarrhea.

## 2021-04-29 NOTE — Assessment & Plan Note (Addendum)
Low BP is better off Toprol Hydrate well Recent labs reviewed

## 2021-04-29 NOTE — Progress Notes (Signed)
Subjective:  Patient ID: Amanda Arnold, female    DOB: 1946-07-12  Age: 75 y.o. MRN: [id number]  CC: Follow-up (3 month f/u)   HPI Amanda Arnold presents for low BP x 1 month 95/89 on Oct 13. Off Toprol x 1 month.  She is on gemcitabine/capecitabine. She denies nausea/vomiting.  No mouth sores.  No diarrhea.   F/u on pancreatic cancer, DM, HTN  Outpatient Medications Prior to Visit  Medication Sig Dispense Refill   ALPRAZolam (XANAX) 0.5 MG tablet TAKE 1 TABLET(0.5 MG) BY MOUTH TWICE DAILY AS NEEDED FOR ANXIETY (Patient taking differently: Take 0.25 mg by mouth at bedtime as needed. TAKE 1 TABLET(0.5 MG) BY MOUTH TWICE DAILY AS NEEDED FOR ANXIETY) 60 tablet 2   capecitabine (XELODA) 500 MG tablet Take 2 tablets (1,000 mg total) by mouth 2 (two) times daily after a meal. Take for 21 days, then hold for 7 days. Repeat every 28 days. 84 tablet 0   HYDROcodone-acetaminophen (NORCO) 7.5-325 MG tablet Take 1 tablet by mouth every 8 (eight) hours as needed for severe pain. 60 tablet 0   gabapentin (NEURONTIN) 300 MG capsule Take 1 capsule (300 mg total) by mouth 3 (three) times daily. (Patient not taking: No sig reported) 90 capsule 5   lidocaine-prilocaine (EMLA) cream Apply 1 application topically as directed. Apply to port site 2 hours prior to stick and cover with plastic wrap (Patient not taking: No sig reported) 30 g 5   omeprazole (PRILOSEC) 40 MG capsule TAKE 1 CAPSULE(40 MG) BY MOUTH TWICE DAILY BEFORE A MEAL (Patient not taking: No sig reported) 60 capsule 6   ondansetron (ZOFRAN) 8 MG tablet Take 1 tablet (8 mg total) by mouth every 8 (eight) hours as needed for nausea or vomiting. Start 72 hours after IV chemo infusion (Patient not taking: No sig reported) 30 tablet 1   prochlorperazine (COMPAZINE) 10 MG tablet Take 1 tablet (10 mg total) by mouth every 6 (six) hours as needed for nausea. (Patient not taking: No sig reported) 60 tablet 1   rosuvastatin (CRESTOR) 10 MG tablet TAKE 1  TABLET(10 MG) BY MOUTH DAILY (Patient not taking: No sig reported) 90 tablet 0   No facility-administered medications prior to visit.    ROS: Review of Systems  Constitutional:  Positive for fatigue. Negative for activity change, appetite change, chills and unexpected weight change.  HENT:  Negative for congestion, mouth sores and sinus pressure.   Eyes:  Negative for visual disturbance.  Respiratory:  Negative for cough and chest tightness.   Gastrointestinal:  Negative for abdominal distention, abdominal pain and nausea.  Genitourinary:  Negative for difficulty urinating, frequency and vaginal pain.  Musculoskeletal:  Negative for back pain and gait problem.  Skin:  Negative for pallor and rash.  Neurological:  Positive for light-headedness. Negative for dizziness, tremors, weakness, numbness and headaches.  Psychiatric/Behavioral:  Negative for confusion, sleep disturbance and suicidal ideas. The patient is nervous/anxious.    Objective:  BP 120/62 (BP Location: Left Arm)   Pulse 67   Temp 98.4 F (36.9 C) (Oral)   Ht 5\' 2"  (1.575 m)   Wt 166 lb 6.4 oz (75.5 kg)   LMP  (LMP Unknown)   SpO2 96%   BMI 30.43 kg/m   BP Readings from Last 3 Encounters:  04/29/21 120/62  04/24/21 125/68  04/15/21 126/60    Wt Readings from Last 3 Encounters:  04/29/21 166 lb 6.4 oz (75.5 kg)  04/24/21 167 lb 3.2 oz (  75.8 kg)  04/15/21 169 lb (76.7 kg)    Physical Exam Constitutional:      General: She is not in acute distress.    Appearance: She is well-developed. She is obese.  HENT:     Head: Normocephalic.     Right Ear: External ear normal.     Left Ear: External ear normal.     Nose: Nose normal.  Eyes:     General:        Right eye: No discharge.        Left eye: No discharge.     Conjunctiva/sclera: Conjunctivae normal.     Pupils: Pupils are equal, round, and reactive to light.  Neck:     Thyroid: No thyromegaly.     Vascular: No JVD.     Trachea: No tracheal  deviation.  Cardiovascular:     Rate and Rhythm: Normal rate and regular rhythm.     Heart sounds: Normal heart sounds.  Pulmonary:     Effort: No respiratory distress.     Breath sounds: No stridor. No wheezing.  Abdominal:     General: Bowel sounds are normal. There is no distension.     Palpations: Abdomen is soft. There is no mass.     Tenderness: no abdominal tenderness There is no guarding or rebound.  Musculoskeletal:        General: No tenderness.     Cervical back: Normal range of motion and neck supple. No rigidity.  Lymphadenopathy:     Cervical: No cervical adenopathy.  Skin:    Findings: No erythema or rash.  Neurological:     Cranial Nerves: No cranial nerve deficit.     Motor: No abnormal muscle tone.     Coordination: Coordination normal.     Deep Tendon Reflexes: Reflexes normal.  Psychiatric:        Behavior: Behavior normal.        Thought Content: Thought content normal.        Judgment: Judgment normal.    Lab Results  Component Value Date   WBC 7.2 04/24/2021   HGB 12.7 04/24/2021   HCT 37.4 04/24/2021   PLT 200 04/24/2021   GLUCOSE 137 (H) 04/24/2021   CHOL 167 09/17/2017   TRIG 92.0 09/17/2017   HDL 53.00 09/17/2017   LDLCALC 96 09/17/2017   ALT 14 04/24/2021   AST 26 04/24/2021   NA 138 04/24/2021   K 4.1 04/24/2021   CL 102 04/24/2021   CREATININE 0.53 04/24/2021   BUN 15 04/24/2021   CO2 27 04/24/2021   TSH 1.24 02/16/2020   INR 1.1 (H) 08/22/2020   HGBA1C 5.9 (H) 10/17/2020    CT CHEST WO CONTRAST  Result Date: 04/24/2021 CLINICAL DATA:  Lung nodules. EXAM: CT CHEST WITHOUT CONTRAST TECHNIQUE: Multidetector CT imaging of the chest was performed following the standard protocol without IV contrast. COMPARISON:  11/13/2020.  04/24/2020. FINDINGS: Cardiovascular: Left IJ Port-A-Cath terminates in the SVC. Atherosclerotic calcification of the aorta. Heart is at the upper limits of normal in size. No pericardial effusion.  Mediastinum/Nodes: Mediastinal and axillary lymph nodes are not enlarged by CT size criteria. Hilar regions are difficult to definitively evaluate without IV contrast. Esophagus is unremarkable. Lungs/Pleura: Centrilobular and paraseptal emphysema. Scarring in the medial right middle lobe. 4 mm right middle lobe nodule (3/99), similar to minimally smaller than on 11/13/2020. Additional scattered pulmonary parenchymal scarring. No pleural fluid. Airway is unremarkable. Upper Abdomen: Visualized portion of the liver is unremarkable. Cholecystectomy.  Fluid density right adrenal nodule measures 1.8 cm. Fluid density left adrenal nodule, 2.6 cm. Visualized portions of the kidneys are unremarkable. Postoperative changes in the pancreas. 2.7 x 3.3 cm fluid density lesion in the body/tail of the pancreas represents the residua of previously seen multiple fluid collections in the tail on 11/13/2020. Visualized portions of the stomach and bowel are grossly unremarkable. No upper abdominal adenopathy. Musculoskeletal: Degenerative changes in the spine. No worrisome lytic or sclerotic lesions. IMPRESSION: 1. 4 mm right middle lobe nodule, similar to minimally smaller than on 11/13/2020. 2. Residual small fluid collection in the body/tail of the pancreas may represent a postoperative seroma or pseudocyst. 3. Bilateral adrenal adenomas. 4.  Aortic atherosclerosis (ICD10-I70.0). 5.  Emphysema (ICD10-J43.9). Electronically Signed   By: Lorin Picket M.D.   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 She is on gemcitabine/capecitabine. She denies nausea/vomiting.  No mouth sores.  No diarrhea.       Coronary atherosclerosis    Cont on Crestor No angina      Essential hypertension    Low BP is better off Toprol Hydrate well Recent labs reviewed       HYPERGLYCEMIA    Overall better        Hypotension    Better off Toprol Hydrate well On Chemotherapy. She is on gemcitabine/capecitabine. She denies nausea/vomiting.  No mouth sores.  No diarrhea.          Follow-up: Return in about 2 months (around 06/29/2021) for a follow-up visit.  Walker Kehr, MD

## 2021-04-30 ENCOUNTER — Other Ambulatory Visit (HOSPITAL_COMMUNITY): Payer: Self-pay

## 2021-05-02 ENCOUNTER — Other Ambulatory Visit: Payer: Self-pay | Admitting: *Deleted

## 2021-05-15 IMAGING — CT CT ABD-PELV W/ CM
2 of 9 series · 14 of 46 positions shown, 16 images · IV contrast (OMNIPAQUE)
Comparison: 08/02/2020, MR abdomen, 06/22/2020, CT abdomen pelvis,
06/01/2020

CLINICAL DATA: Follow-up pancreatic mass

EXAM:
CT CHEST, ABDOMEN, AND PELVIS WITH CONTRAST
TECHNIQUE: Multidetector CT imaging of the chest, abdomen and pelvis was
performed following the standard protocol during bolus
administration of intravenous contrast.
CONTRAST:  100mL OMNIPAQUE IOHEXOL 300 MG/ML SOLN, additional oral
enteric contrast

[Series 5: coronal arterial · coronal · arterial · 0.54mm/px · 3 of 101 slices shown]
[im 26/101  soft-tissue]
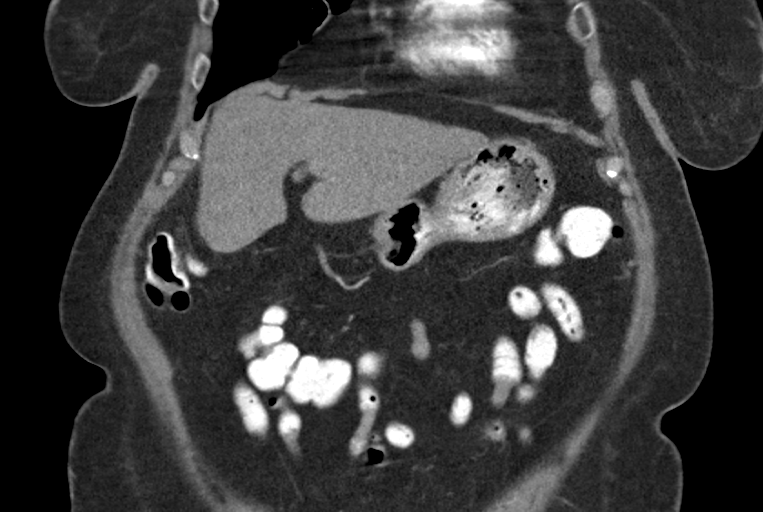
[im 51/101  soft-tissue]
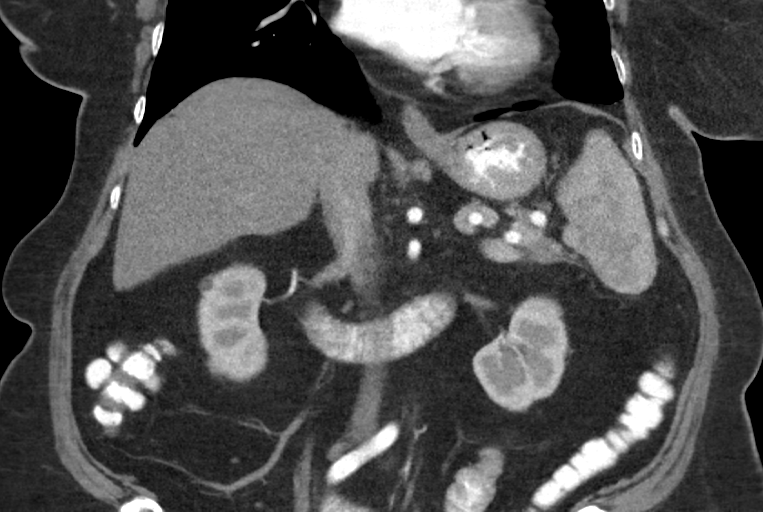
[im 76/101  soft-tissue]
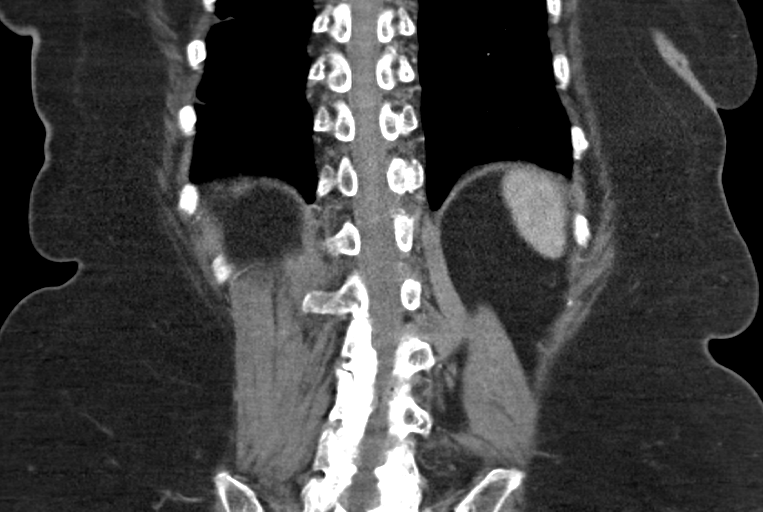

[Series 7: axial venous · axial · portal-venous · 0.78mm/px · z∈[+1046,+1580]mm · 11 of 214 slices shown, 13 images]
[im 18/214  soft-tissue]
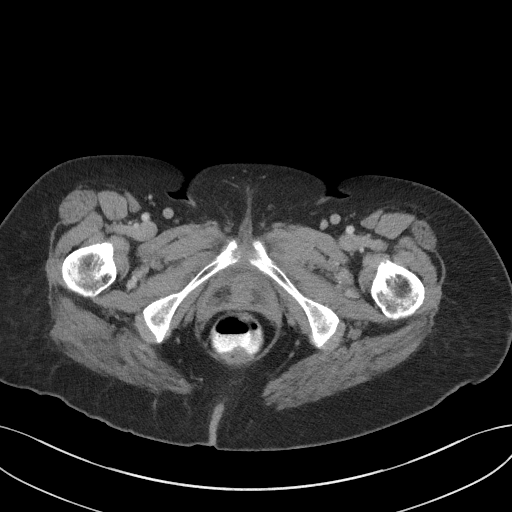
[im 18/214  bone]
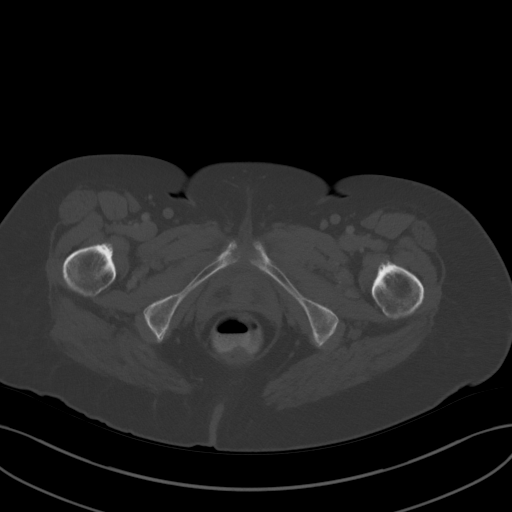
[im 36/214  soft-tissue]
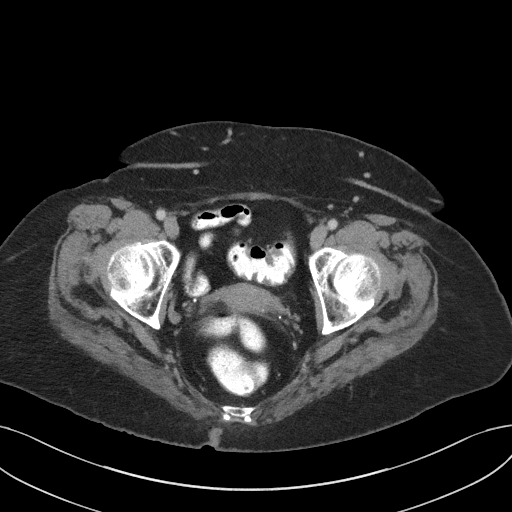
[im 54/214  soft-tissue]
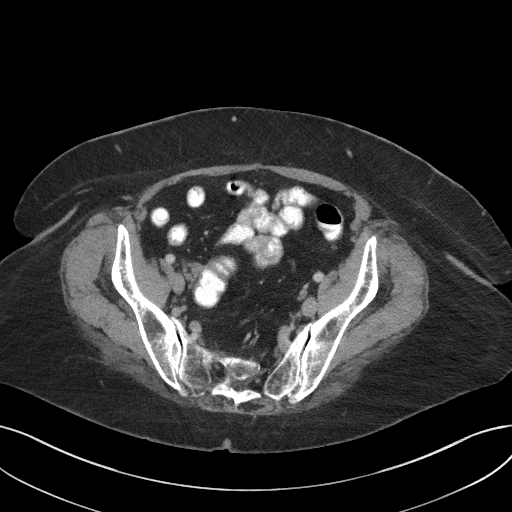
[im 72/214  soft-tissue]
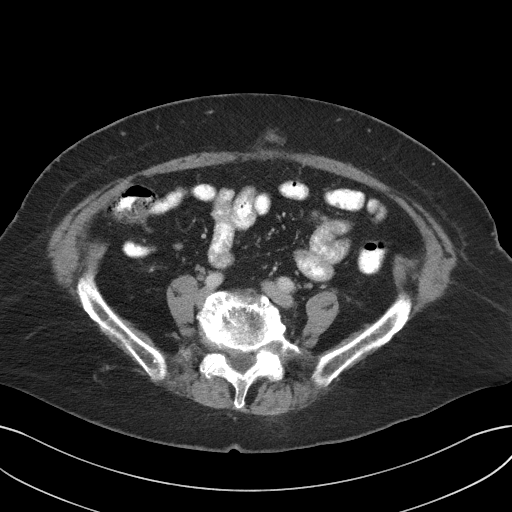
[im 89/214  soft-tissue]
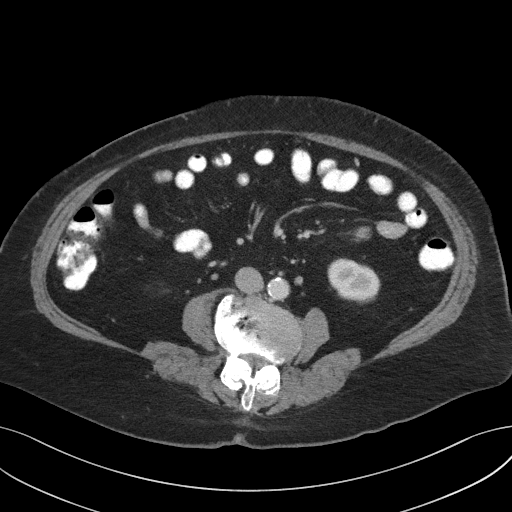
[im 107/214  soft-tissue]
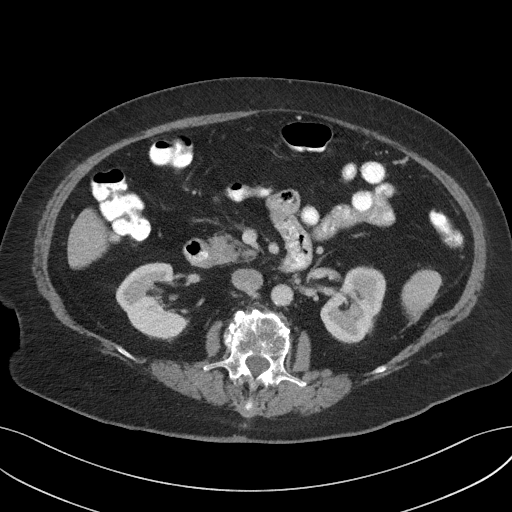
[im 125/214  soft-tissue]
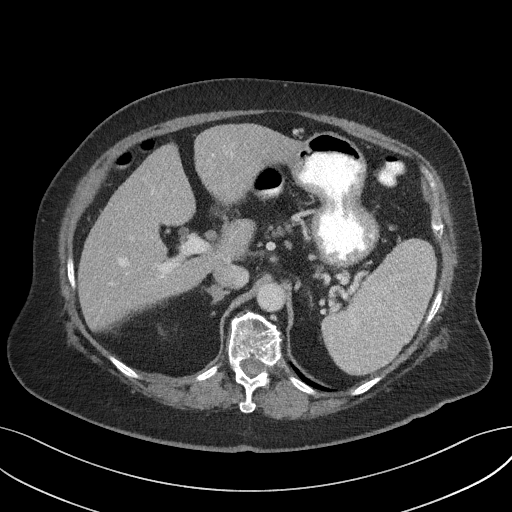
[im 143/214  soft-tissue]
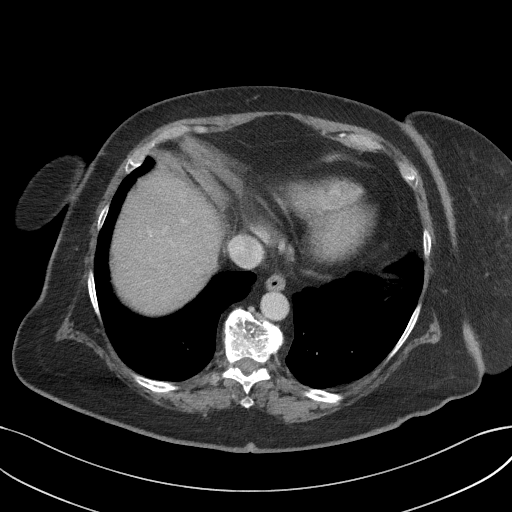
[im 160/214  soft-tissue]
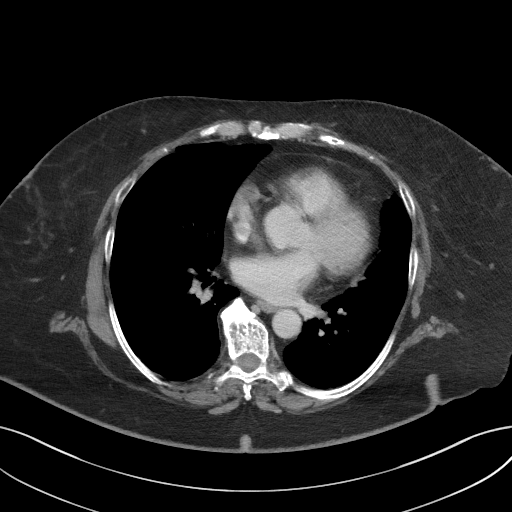
[im 160/214  bone]
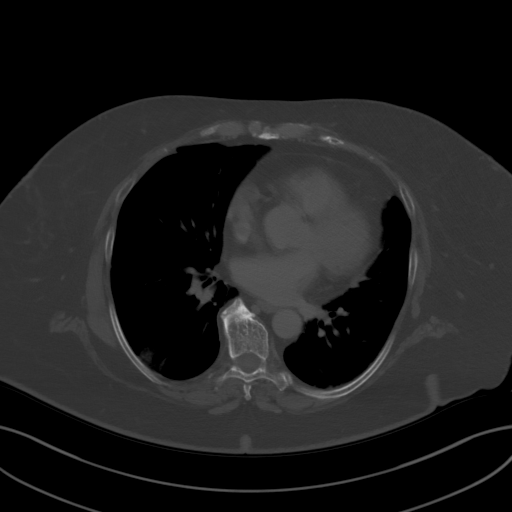
[im 178/214  soft-tissue]
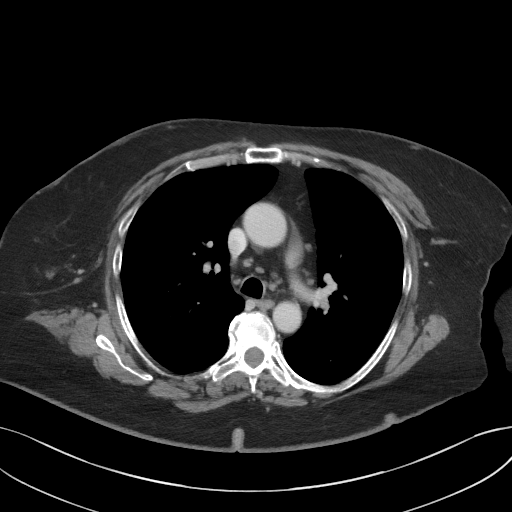
[im 196/214  soft-tissue]
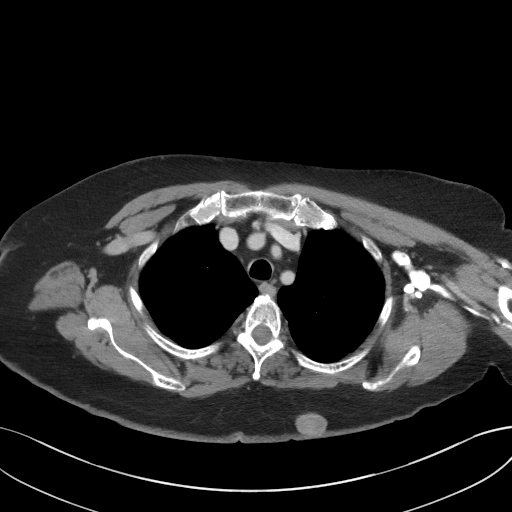

[14 of 46 positions shown; findings below may reference images not displayed]

FINDINGS: CT CHEST FINDINGS

Cardiovascular: Aortic atherosclerosis. Normal heart size. No
pericardial effusion.

Mediastinum/Nodes: No enlarged mediastinal, hilar, or axillary lymph
nodes. Thyroid gland, trachea, and esophagus demonstrate no
significant findings.

Lungs/Pleura: Unchanged 6 mm pulmonary nodule of the right middle
lobe (series 10, image 89). Mild, predominantly dependent bibasilar
scarring of the lungs. No pleural effusion or pneumothorax.

Musculoskeletal: No suspicious bone lesions identified. Superficial
subcutaneous lesion of the posterior left back at the level of the
scapula, measuring 2.5 x 1.5 cm (series 7, image 18).

CT ABDOMEN PELVIS FINDINGS

Hepatobiliary: No focal liver abnormality is seen. Hepatic
steatosis. Status post cholecystectomy. No biliary dilatation.

Pancreas: No significant change in a hypodense mass of the tip of
the pancreatic tail measuring 3.2 x 3.0 cm no pancreatic ductal
dilatation or surrounding inflammatory changes.

Spleen: Normal in size without significant abnormality.

Adrenals/Urinary Tract: Fat containing, definitively benign
bilateral adrenal adenomata. Small nonobstructive calculus of the
inferior pole of the right kidney (series 7, image 113). Bladder is
unremarkable.

Stomach/Bowel: Stomach is within normal limits. Appendix appears
normal. No evidence of bowel wall thickening, distention, or
inflammatory changes.

Vascular/Lymphatic: Aortic atherosclerosis. The splenic vein is
occluded at the pancreatic tail by adjacent mass (series 7, image
99) with vascular collaterals about the splenic hilum and
gastroesophageal junction. No enlarged abdominal or pelvic lymph
nodes.

Reproductive: Calcified uterine fibroids.

Other: No abdominal wall hernia or abnormality. No abdominopelvic
ascites.

Musculoskeletal: No acute or significant osseous findings.
IMPRESSION: 1. No significant change in a hypodense mass of the tip of the
pancreatic tail measuring 3.2 x 3.0 cm. This remains suspicious for
pancreatic adenocarcinoma.
2. As previously noted, the splenic vein is occluded at the
pancreatic tail by adjacent mass with vascular collaterals about the
splenic hilum and gastroesophageal junction.
3. Unchanged 6 mm pulmonary nodule of the right middle lobe,
nonspecific; most likely incidental, benign sequelae of infection or
inflammation, isolated manifestation of metastatic disease not
favored but difficult to strictly exclude. Attention on follow-up.
4. Small nonobstructive calculus of the inferior pole of the right
kidney.
5. Hepatic steatosis.
6. Superficial subcutaneous lesion of the posterior left back at the
level of the scapula, measuring 2.5 x 1.5 cm, likely a sebaceous or
cutaneous inclusion cyst. Correlate with physical exam.

Aortic Atherosclerosis ([id number]).

## 2021-05-21 ENCOUNTER — Telehealth: Payer: Medicare Other

## 2021-05-21 NOTE — Progress Notes (Deleted)
Chronic Care Management Pharmacy Note  05/21/2021 Name:  Amanda Arnold MRN:  [id number] DOB:  04/28/1946  Summary: -Patient notes that she feels she is doing well at this time, reports that anxiety/ mood is complicated at times due to recent loss of her son, taking 1/2 tablet of alprazolam nightly, at most takes an additional 1/2 tablet daily - declined referral to counseling  -Patient reports that she is not currently taking gabapentin due to the medication making her feel sick, notes that pain levels are controlled by periodic use of ibuprofen  and vicodin as needed  -denies current issues/side effects of medications that she is currently taking -Patient is cognizant of diet and effects that diet can have on blood pressure, cholesterol, and acid reflux/ stomach ulcers  Recommendations/Changes made from today's visit: -Recommending for patient to discontinue use of ibuprofen in setting of stomach ulcer, advised for use of voltaren gel 2g up to 4 times daily as patient finds relief from NSAID use for pain in her right leg and feet -Patient to have updated lipid panel with next PCP appointment, adjust rosuvastatin dose if needed  - Latest A1c under control at 5.9% - will continue to monitor and add medication if necessary  Subjective: Amanda Arnold is an 75 y.o. year old female who is a primary patient of Plotnikov, Evie Lacks, MD.  The CCM team was consulted for assistance with disease management and care coordination needs.    Engaged with patient by telephone for initial visit in response to provider referral for pharmacy case management and/or care coordination services.   Consent to Services:  The patient was given the following information about Chronic Care Management services today, agreed to services, and gave verbal consent: 1. CCM service includes personalized support from designated clinical staff supervised by the primary care provider, including individualized plan of care  and coordination with other care providers 2. 24/7 contact phone numbers for assistance for urgent and routine care needs. 3. Service will only be billed when office clinical staff spend 20 minutes or more in a month to coordinate care. 4. Only one practitioner may furnish and bill the service in a calendar month. 5.The patient may stop CCM services at any time (effective at the end of the month) by phone call to the office staff. 6. The patient will be responsible for cost sharing (co-pay) of up to 20% of the service fee (after annual deductible is met). Patient agreed to services and consent obtained.  Patient Care Team: Plotnikov, Evie Lacks, MD as PCP - General Jerline Pain, MD as PCP - Cardiology (Cardiology) Erline Levine, MD as Consulting Physician (Neurosurgery) Jerline Pain, MD as Consulting Physician (Cardiology) Ladell Pier, MD as Consulting Physician (Oncology) [name], MD as Consulting Physician (General Surgery) Tomasa Blase, [facility] as Pharmacist (Pharmacist)  Recent office visits:  04/29/21 - Dr. Alain Marion - BP better controlled off toprol - no changes to medications  04/15/2021 - Dr. Jenny Reichmann - f/u for HTN - patient to continue hold of toprol - follow up with PCP in 4 weeks    Recent consult visits:  04/24/2021 - Ned Card NP - Oncolongy - f/u after beginning cycle 6 of gemcitabine/capecitabine  - f/u in 4 weeks 04/11/2021 - Dr. 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  Objective:  Lab Results  Component Value Date   CREATININE 0.53 04/24/2021   BUN 15 04/24/2021   GFR  87.58 10/29/2020   GFRNONAA >60 04/24/2021   GFRAA 103 04/20/2020   NA 138 04/24/2021   K 4.1 04/24/2021   CALCIUM 9.3 04/24/2021   CO2 27 04/24/2021   GLUCOSE 137 (H) 04/24/2021    Lab Results  Component Value Date/Time   HGBA1C 5.9 (H) 10/17/2020 07:00 AM   HGBA1C 6.7 11/04/2019 12:00 AM   HGBA1C 6.2 07/27/2019 10:34 AM   GFR 87.58 10/29/2020 10:45 AM   GFR 88.02 08/22/2020 08:46 AM    Last diabetic Eye exam:  Lab Results  Component Value Date/Time   HMDIABEYEEXA No Retinopathy 10/23/2019 12:00 AM    Last diabetic Foot exam:  No results found for: HMDIABFOOTEX   Lab Results  Component Value Date   CHOL 167 09/17/2017   HDL 53.00 09/17/2017   LDLCALC 96 09/17/2017   TRIG 92.0 09/17/2017   CHOLHDL 3 09/17/2017    Hepatic Function Latest Ref Rng & Units 04/24/2021 04/11/2021 03/27/2021  Total Protein 6.5 - 8.1 g/dL 6.8 6.4(L) 6.5  Albumin 3.5 - 5.0 g/dL 4.2 4.2 4.0  AST 15 - 41 U/L 26 32 24  ALT 0 - 44 U/L 14 15 14   Alk Phosphatase 38 - 126 U/L 66 69 67  Total Bilirubin 0.3 - 1.2 mg/dL 0.8 0.9 0.8  Bilirubin, Direct 0.0 - 0.3 mg/dL - - -    Lab Results  Component Value Date/Time   TSH 1.24 02/16/2020 08:57 AM   TSH 1.50 09/17/2017 09:52 AM   FREET4 1.2 02/16/2020 08:57 AM    CBC Latest Ref Rng & Units 04/24/2021 04/11/2021 03/27/2021  WBC 4.0 - 10.5 K/uL 7.2 8.2 6.3  Hemoglobin 12.0 - 15.0 g/dL 12.7 12.5 11.6(L)  Hematocrit 36.0 - 46.0 % 37.4 35.8(L) 34.4(L)  Platelets 150 - 400 K/uL 200 202 201    Lab Results  Component Value Date/Time   VD25OH 34 02/25/2010 05:47 PM    Clinical ASCVD: No  The ASCVD Risk score (Arnett DK, et al., 2019) failed to calculate for the following reasons:   Cannot find a previous HDL lab   Cannot find a previous total cholesterol lab    Depression screen [facility] 2/9 12/12/2020 11/01/2019 10/28/2018  Decreased Interest 1 0 1  Down, Depressed, Hopeless 1 0 1  PHQ - 2 Score 2 0 2  Altered sleeping - - 3  Tired, decreased energy - -  1  Change in appetite - - 1  Feeling bad or failure about yourself  - - 0  Trouble concentrating - - 0  Moving slowly or fidgety/restless - - 0  Suicidal thoughts - - 0  PHQ-9 Score - - 7  Difficult doing work/chores - - Not difficult at all  Some recent data might be hidden    Social History   Tobacco Use  Smoking Status Former   Types: Cigarettes   Quit date: 07/10/2013   Years since quitting: 7.8  Smokeless Tobacco Current   Types: Snuff   BP Readings from Last 3 Encounters:  04/29/21 120/62  04/24/21 125/68  04/15/21 126/60   Pulse Readings from Last 3 Encounters:  04/29/21 67  04/24/21 83  04/15/21 77  Wt Readings from Last 3 Encounters:  04/29/21 166 lb 6.4 oz (75.5 kg)  04/24/21 167 lb 3.2 oz (75.8 kg)  04/15/21 169 lb (76.7 kg)   BMI Readings from Last 3 Encounters:  04/29/21 30.43 kg/m  04/24/21 30.58 kg/m  04/15/21 30.91 kg/m    Assessment/Interventions: Review of patient past medical history, allergies, medications, health status, including review of consultants reports, laboratory and other test data, was performed as part of comprehensive evaluation and provision of chronic care management services.   SDOH:  (Social Determinants of Health) assessments and interventions performed: Yes  SDOH Screenings   Alcohol Screen: Low Risk    Last Alcohol Screening Score (AUDIT): 0  Depression (PHQ2-9): Low Risk    PHQ-2 Score: 2  Financial Resource Strain: Low Risk    Difficulty of Paying Living Expenses: Not hard at all  Food Insecurity: No Food Insecurity   Worried About Charity fundraiser in the Last Year: Never true   Ran Out of Food in the Last Year: Never true  Housing: Low Risk    Last Housing Risk Score: 0  Physical Activity: Sufficiently Active   Days of Exercise per Week: 5 days   Minutes of Exercise per Session: 30 min  Social Connections: Engineer, building services of Communication with Friends and Family: More than three times a  week   Frequency of Social Gatherings with Friends and Family: Twice a week   Attends Religious Services: 1 to 4 times per year   Active Member of [employer] or Organizations: No   Attends Music therapist: 1 to 4 times per year   Marital Status: Married  Stress: No Stress Concern Present   Feeling of Stress : Not at all  Tobacco Use: High Risk   Smoking Tobacco Use: Former   Smokeless Tobacco Use: Current   Passive Exposure: Not on Pensions consultant Needs: No Transportation Needs   Lack of Transportation (Medical): No   Lack of Transportation (Non-Medical): No    CCM Care Plan  Allergies  Allergen Reactions   Morphine And Related Shortness Of Breath   Bupropion Hcl Other (See Comments)    "does not feel right"   Ezetimibe Other (See Comments)    REACTION: legs burning   Metformin And Related Diarrhea    Diarrhea w/XR or regular    Naproxen     Upset stomach, diarrhea   Trazodone Hcl Swelling   Tramadol Hcl Palpitations    REACTION: palpitations    Medications Reviewed Today     Reviewed by Cassandria Anger, MD (Physician) on 04/29/21 at [phone]  Med List Status: <None>   Medication Order Taking? Sig Documenting Provider Last Dose Status Informant  ALPRAZolam (XANAX) 0.5 MG tablet [id number] Yes TAKE 1 TABLET(0.5 MG) BY MOUTH TWICE DAILY AS NEEDED FOR ANXIETY  Patient taking differently: Take 0.25 mg by mouth at bedtime as needed. TAKE 1 TABLET(0.5 MG) BY MOUTH TWICE DAILY AS NEEDED FOR ANXIETY   Plotnikov, Evie Lacks, MD Taking Active   capecitabine (XELODA) 500 MG tablet [id number] Yes Take 2 tablets (1,000 mg total) by mouth 2 (two) times daily after a meal. Take for 21 days, then hold for 7 days. Repeat every 28 days. Ladell Pier, MD Taking Active   gabapentin (NEURONTIN) 300 MG capsule [id number] No Take 1 capsule (300 mg total) by mouth 3 (three) times daily.  Patient not taking: No sig reported   Plotnikov, Evie Lacks, MD Not Taking Active  HYDROcodone-acetaminophen (NORCO) 7.5-325 MG tablet [id number] Yes Take 1 tablet by mouth every 8 (eight) hours as needed for severe pain. Plotnikov, Evie Lacks, MD Taking Active   lidocaine-prilocaine (EMLA) cream [id number] No Apply 1 application topically as directed. Apply to port site 2 hours prior to stick and cover with plastic wrap  Patient not taking: No sig reported   Ladell Pier, MD Not Taking Active   omeprazole (PRILOSEC) 40 MG capsule [id number] No TAKE 1 CAPSULE(40 MG) BY MOUTH TWICE DAILY BEFORE A MEAL  Patient not taking: No sig reported   Mansouraty, Telford Nab., MD Not Taking Active            Med Note Tania Ade   Wed Mar 27, 2021  9:33 AM) Not taking due to Xeloda  ondansetron (ZOFRAN) 8 MG tablet [id number] No Take 1 tablet (8 mg total) by mouth every 8 (eight) hours as needed for nausea or vomiting. Start 72 hours after IV chemo infusion  Patient not taking: No sig reported   Ladell Pier, MD Not Taking Active   prochlorperazine (COMPAZINE) 10 MG tablet [id number] No Take 1 tablet (10 mg total) by mouth every 6 (six) hours as needed for nausea.  Patient not taking: No sig reported   Ladell Pier, MD Not Taking Active   rosuvastatin (CRESTOR) 10 MG tablet [id number]  Take 1 tablet (10 mg total) by mouth daily. Please make overdue appt for future refills. Thank you 1st attempt Jerline Pain, MD  Active   Med List Note Darl Pikes, RPH-CPP 01/04/21 1540): Xeloda filled at [facility]            Patient Active Problem List   Diagnosis Date Noted   Hypotension 04/15/2021   Urinary frequency 04/15/2021   Genetic testing 12/04/2020   Family history of breast cancer 11/26/2020   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   Malnutrition of moderate degree 10/19/2020   Weight loss 09/04/2020   Cancer of pancreas, tail ([place]) 08/28/2020   Shoulder pain, right 07/10/2020   Pancreatic mass 06/26/2020   Coronary atherosclerosis 05/28/2020   Pulmonary  nodule 05/28/2020   Palpitations 02/16/2020   Dysuria 07/27/2019   Piriformis syndrome 03/29/2019   Humerus fracture 01/27/2019   Closed fracture of left proximal humerus 12/07/2018   Trochanteric bursitis of right hip 07/12/2018   Trochanteric bursitis of both hips 03/23/2018   Neck pain 02/11/2018   Peroneal mononeuropathy, left 01/26/2017   Skin cyst 09/26/2016   Adjustment disorder with mixed anxiety and depressed mood 06/27/2016   Fatigue 03/28/2016   Restless leg syndrome 03/28/2016   Right lower quadrant abdominal pain 08/15/2015   Well adult exam 07/03/2015   Meralgia paraesthetica 05/29/2015   Paresthesia 05/15/2015   Sciatic leg pain 09/22/2014   Rash and nonspecific skin eruption 09/11/2014   Right lower quadrant pain 09/11/2014   Nonspecific abnormal electrocardiogram (ECG) (EKG) 07/27/2014   Precordial pain 07/27/2014   Cerumen impaction 06/16/2014   Chest pain, atypical 03/03/2014   UTI (urinary tract infection) 03/29/2013   Herpes zoster 03/02/2013   Abscess of left leg 02/02/2013   Leg pain, bilateral 12/09/2012   Left groin pain 09/09/2012   Unspecified sinusitis (chronic) 08/25/2012   Chest wall pain 05/20/2012   Nocturia 12/24/2011   Psychosomatic pain 10/14/2011   Left shoulder pain 08/22/2011   Vertigo 06/13/2011   Syncopal vertigo 06/13/2011   Intertriginous candidiasis 03/07/2011   Uncontrolled type 2 diabetes with renal manifestation 03/07/2011   HIP  PAIN 06/17/2010   HYPERGLYCEMIA 05/15/2010   CENTRAL SLEEP APNEA CONDS CLASSIFIED ELSEWHERE 05/14/2010   Obstructive sleep apnea 04/17/2010   Obesity 04/10/2010   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   DYSPNEA 03/15/2010   TRIGGER FINGER 12/18/2009   Abdominal pain, epigastric 12/18/2009   KNEE PAIN 07/11/2009   NEURALGIA, 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/2009   COLD SORE 05/11/2009   T M J 05/11/2009   BREAST PAIN 04/13/2009   Edema 03/01/2009   CERVICAL LYMPHADENOPATHY 03/01/2009   PRURITUS 07/28/2008    TACHYCARDIA 07/28/2008   INSOMNIA, PERSISTENT 02/22/2008   Cystitis 02/22/2008   SWEATING 01/18/2008   BRONCHITIS, ACUTE 12/08/2007   Chronic fatigue 12/08/2007   Headache(784.0) 12/08/2007   Cough 12/08/2007   ARTHRALGIA 11/19/2007   LOW BACK PAIN 01/27/2007   Tobacco use disorder 11/28/2006   Generalized anxiety disorder 05/25/2006   Essential hypertension 05/25/2006   COPD exacerbation ([place]) 05/25/2006   Osteoarthritis 05/25/2006   Polyuria 05/25/2006   Dyslipidemia 04/30/2006   Myalgia 04/02/2006   Anxiety and depression 01/02/2006   Disturbance of skin sensation 05/29/2005   ESOTROPIA, LEFT EYE 1945-11-07    Immunization History  Administered Date(s) Administered   HiB (PRP-OMP) 09/04/2020   Meningococcal B, Unspecified 09/04/2020   Pneumococcal Conjugate-13 06/27/2016   Pneumococcal Polysaccharide-23 04/14/2005, 09/17/2017, 09/04/2020   Tdap 04/28/2019    Conditions to be addressed/monitored:  Hypertension, Hyperlipidemia, Coronary Artery Disease, GERD, Anxiety, Osteoarthritis, and Pancreatic Cancer   There are no care plans that you recently modified to display for this patient.     Medication Assistance: None required.  Patient affirms current coverage meets needs.   Patient's preferred pharmacy is:  [facility] [address] [place] [address] Phone: [phone] Fax: [phone]  [facility] [place], [place] [address] Phone: [phone] Fax: [phone]  [place], [place] - [address] [place] AT [facility] [place] [address] Phone: [phone] Fax: [phone]   Uses pill box? No - able to manage without use of pill box Pt endorses 100% compliance  Care Plan and Follow Up Patient Decision:  Patient agrees to Care Plan and Follow-up.  Plan:  Telephone follow up appointment with care management team member scheduled for:  2 months and The patient has been provided with contact information for the care management team and has been advised to call with any health related questions or concerns.   Tomasa Blase, PharmD Clinical Pharmacist, [place]   ***  Current chart prep = 10 minutes

## 2021-05-22 ENCOUNTER — Inpatient Hospital Stay: Payer: Medicare Other

## 2021-05-22 ENCOUNTER — Inpatient Hospital Stay: Payer: Medicare Other | Admitting: Oncology

## 2021-05-22 ENCOUNTER — Other Ambulatory Visit: Payer: Self-pay

## 2021-05-22 ENCOUNTER — Inpatient Hospital Stay: Payer: Medicare Other | Attending: Oncology

## 2021-05-22 ENCOUNTER — Other Ambulatory Visit: Payer: Self-pay | Admitting: *Deleted

## 2021-05-22 ENCOUNTER — Encounter: Payer: Self-pay | Admitting: *Deleted

## 2021-05-22 VITALS — BP 126/74 | HR 76 | Temp 98.7°F | Resp 20 | Ht 62.0 in | Wt 167.8 lb

## 2021-05-22 DIAGNOSIS — K259 Gastric ulcer, unspecified as acute or chronic, without hemorrhage or perforation: Secondary | ICD-10-CM | POA: Insufficient documentation

## 2021-05-22 DIAGNOSIS — Z95828 Presence of other vascular implants and grafts: Secondary | ICD-10-CM

## 2021-05-22 DIAGNOSIS — C252 Malignant neoplasm of tail of pancreas: Secondary | ICD-10-CM | POA: Insufficient documentation

## 2021-05-22 DIAGNOSIS — R21 Rash and other nonspecific skin eruption: Secondary | ICD-10-CM | POA: Insufficient documentation

## 2021-05-22 DIAGNOSIS — Z79899 Other long term (current) drug therapy: Secondary | ICD-10-CM | POA: Diagnosis not present

## 2021-05-22 DIAGNOSIS — J449 Chronic obstructive pulmonary disease, unspecified: Secondary | ICD-10-CM | POA: Insufficient documentation

## 2021-05-22 DIAGNOSIS — I10 Essential (primary) hypertension: Secondary | ICD-10-CM | POA: Diagnosis not present

## 2021-05-22 LAB — CMP (CANCER CENTER ONLY)
ALT: 13 U/L (ref 0–44)
AST: 21 U/L (ref 15–41)
Albumin: 3.9 g/dL (ref 3.5–5.0)
Alkaline Phosphatase: 64 U/L (ref 38–126)
Anion gap: 7 (ref 5–15)
BUN: 14 mg/dL (ref 8–23)
CO2: 27 mmol/L (ref 22–32)
Calcium: 8.9 mg/dL (ref 8.9–10.3)
Chloride: 105 mmol/L (ref 98–111)
Creatinine: 0.47 mg/dL (ref 0.44–1.00)
GFR, Estimated: >60 mL/min (ref >60)
Glucose, Bld: 171 mg/dL — ABNORMAL HIGH (ref 70–99)
Potassium: 4 mmol/L (ref 3.5–5.1)
Sodium: 139 mmol/L (ref 135–145)
Total Bilirubin: 0.7 mg/dL (ref 0.3–1.2)
Total Protein: 6.5 g/dL (ref 6.5–8.1)

## 2021-05-22 LAB — CBC WITH DIFFERENTIAL (CANCER CENTER ONLY)
Abs Immature Granulocytes: 0.01 10*3/uL (ref 0.00–0.07)
Basophils Absolute: 0.1 10*3/uL (ref 0.0–0.1)
Basophils Relative: 1 %
Eosinophils Absolute: 0.2 10*3/uL (ref 0.0–0.5)
Eosinophils Relative: 2 %
HCT: 38.8 % (ref 36.0–46.0)
Hemoglobin: 12.8 g/dL (ref 12.0–15.0)
Immature Granulocytes: 0 %
Lymphocytes Relative: 32 %
Lymphs Abs: 2.5 10*3/uL (ref 0.7–4.0)
MCH: 36.3 pg — ABNORMAL HIGH (ref 26.0–34.0)
MCHC: 33 g/dL (ref 30.0–36.0)
MCV: 109.9 fL — ABNORMAL HIGH (ref 80.0–100.0)
Monocytes Absolute: 0.8 10*3/uL (ref 0.1–1.0)
Monocytes Relative: 10 %
Neutro Abs: 4.3 10*3/uL (ref 1.7–7.7)
Neutrophils Relative %: 55 %
Platelet Count: 278 10*3/uL (ref 150–400)
RBC: 3.53 MIL/uL — ABNORMAL LOW (ref 3.87–5.11)
RDW: 14.8 % (ref 11.5–15.5)
WBC Count: 7.8 10*3/uL (ref 4.0–10.5)
nRBC: 0 % (ref 0.0–0.2)

## 2021-05-22 MED ORDER — SODIUM CHLORIDE 0.9% FLUSH
10.0000 mL | Freq: Once | INTRAVENOUS | Status: AC
  Administered 2021-05-22: 10 mL via INTRAVENOUS

## 2021-05-22 MED ORDER — HEPARIN SOD (PORK) LOCK FLUSH 100 UNIT/ML IV SOLN
500.0000 [IU] | Freq: Once | INTRAVENOUS | Status: AC
  Administered 2021-05-22: 500 [IU] via INTRAVENOUS

## 2021-05-22 NOTE — Progress Notes (Signed)
Faxed referral order, demographics and last office note to Dr. Marlowe Aschoff office for port removal. [phone]

## 2021-05-22 NOTE — Progress Notes (Signed)
[facility] OFFICE PROGRESS NOTE   Diagnosis: Pancreas cancer  INTERVAL HISTORY:   Ms. Hoppel completed another treatment with gemcitabine on 04/24/2021.  She completed the final cycle of Xeloda.  No mouth sores, nausea, or hand/foot pain.  She feels well.  She has a mild rash in the groin.  Objective:  Vital signs in last 24 hours:  Blood pressure 126/74, pulse 76, temperature 98.7 F (37.1 C), temperature source Oral, resp. rate 20, height 5\' 2"  (1.575 m), weight 167 lb 12.8 oz (76.1 kg), SpO2 98 %.    HEENT: No ulcers, mild white coat of the tongue, no buccal thrush Lymphatics: No cervical, supraclavicular, axillary, or inguinal nodes Resp: Lungs clear bilaterally Cardio: Regular rate and rhythm GI: No mass, no hepatosplenomegaly Vascular: No leg edema  Skin: Mild yeast rash in the groin  Portacath/PICC-without erythema  Lab Results:  Lab Results  Component Value Date   WBC 7.8 05/22/2021   HGB 12.8 05/22/2021   HCT 38.8 05/22/2021   MCV 109.9 (H) 05/22/2021   PLT 278 05/22/2021   NEUTROABS 4.3 05/22/2021    CMP  Lab Results  Component Value Date   NA 139 05/22/2021   K 4.0 05/22/2021   CL 105 05/22/2021   CO2 27 05/22/2021   GLUCOSE 171 (H) 05/22/2021   BUN 14 05/22/2021   CREATININE 0.47 05/22/2021   CALCIUM 8.9 05/22/2021   PROT 6.5 05/22/2021   ALBUMIN 3.9 05/22/2021   AST 21 05/22/2021   ALT 13 05/22/2021   ALKPHOS 64 05/22/2021   BILITOT 0.7 05/22/2021   GFRNONAA >60 05/22/2021   GFRAA 103 04/20/2020    Lab Results  Component Value Date   [id number] 13 11/21/2020      Medications: I have reviewed the patient's current medications.   Assessment/Plan: Pancreas cancer-stage IIb (pT2pN1) CT abdomen/pelvis 06/01/2020-2.9 x 3 cm pancreas tail mass, slight increase in size of 7 mm right middle lobe nodule MRI/MRCP 06/22/2020-solid 3.8 x 3 x 3.1 cm pancreas tail mass with focal occlusion of the splenic vein and dilated venous  collaterals, no evidence of metastatic disease, bilateral adrenal adenomas CT renal stone study 08/02/2020-scattered airspace opacities throughout the lung bases-new favored to represent infectious/inflammatory changes EUS 08/16/2020-pancreas tail mass, T2N0, FNA biopsy-suspicious for malignancy CTs 08/27/2020-unchanged right middle lobe nodule, superficial left upper back lesion, no change in pancreas tail mass, occluded splenic vein with collaterals at the splenic hilum, no enlarged abdominal pelvic lymph nodes Distal pancreatectomy/splenectomy 10/17/2020- 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  2/15 lymph nodes positive, positive lymphovascular and perineural invasion, resection margins negative, pT2pN1 Cycle 1 gemcitabine/capecitabine 11/21/2020, day 15 gemcitabine 12/05/2020 Cycle 2 gemcitabine/capecitabine 12/20/2020, day 15 gemcitabine 01/03/2021 Cycle 3 gemcitabine/capecitabine 01/16/2021, day 15 gemcitabine 01/30/2021 Cycle 4 gemcitabine/capecitabine 02/13/2021, day 15 gemcitabine 02/27/2021 Cycle 5 gemcitabine/capecitabine 03/13/2021 (capecitabine dose reduced secondary to hand/foot syndrome), day 15 gemcitabine 03/27/2021 Cycle 6 gemcitabine/capecitabine 04/10/2021, day 15 gemcitabine 04/24/2021  2.   Gastric ulcer on EUS 08/16/2020-H. pylori positive 3.   COVID-19 infection January 2022 4.   COPD 5.   Hypertension 6.   Right abdomen/right leg pain-musculoskeletal? 7.   Right middle lobe nodule on CT 06/01/2020, increased from 2017, stable on CT 11/13/2020, stable on CT 04/23/2021 8.   History of tobacco use 9.   Superficial left upper back lesion noted on CT 08/27/2020    Disposition: Ms. Fullam has completed the course of adjuvant gemcitabine/capecitabine.  She is  in clinical remission from pancreas cancer.  We will follow-up on the CA 19-9 from today.  She would like to have the Port-A-Cath removed as soon as  possible.  We will make a referral to Dr. Barry Dienes.  Ms. Woodle declines influenza and COVID-19 vaccines.  She will return for an office visit in 3 months.  I recommended she keep the growing skin dry and use baby powder.   The right middle lobe nodule was stable on a chest CT last month.  There was a small residual fluid collection at the body/tail of the pancreas.  Betsy Coder, MD  05/22/2021  9:44 AM

## 2021-05-23 ENCOUNTER — Other Ambulatory Visit (HOSPITAL_COMMUNITY): Payer: Self-pay

## 2021-05-24 ENCOUNTER — Other Ambulatory Visit: Payer: Self-pay | Admitting: General Surgery

## 2021-05-28 DIAGNOSIS — C252 Malignant neoplasm of tail of pancreas: Secondary | ICD-10-CM | POA: Diagnosis not present

## 2021-05-28 LAB — CANCER ANTIGEN 19-9: CA 19-9: 13 U/mL (ref 0–35)

## 2021-06-11 NOTE — H&P (Signed)
PROVIDER: Georgianne Fick, MD  MRN: [id number] DOB: 12/07/1945 DATE OF ENCOUNTER: 05/28/2021 Subjective   Chief Complaint: LTFU port removal and Pancreatic Cancer   History of Present Illness: Amanda Arnold is a 75 y.o. female who is seen today for follow up of pancreatic cancer  Pt had an incidentally discovered pancreatic tail mass when she was undergoing workup for right sided flank pain and leg numbness.  This started in November 2021.  She also felt ill with diarrhea and no appetite. She lost around 20 pounds.   She denied LUQ pain or early satiety.  She already had cholecystectomy many years ago.  She had EUS and biopsy, but FNA was not definitive for cancer.  EUS staging would be uT2N0.  She saw Dr. Benay Spice and given diagnostic uncertainty, surgery was recommended first.  She does not have breast/ovarian/pancreas cancer in her family history.  Her mother had cervical cancer and a brother had AML.    Pt underwent robotic distal pancreatectomy/splenectomy 10/17/20.  She did well post op other than leukocytosis and pain.   Her final path was T2N1.  At her initial post op appt she was having a lot of pain and some shortness of breath.  CTA chest and ct abdomen were performed which were negative for PE and abscess.  She subsequently had port placed and is on gemcitabine and xeloda and tolerating it reasonably well.    She has completed her gemcitabine and xeloda. She would like to get her port removed. She denies abdominal pain. She isn't having any diarrhea. She is denying weight loss. She feels well overall. She is having some itching under her pannus.    Review of Systems: A complete review of systems was obtained from the patient. I have reviewed this information and discussed as appropriate with the patient. See HPI as well for other ROS.  Review of Systems  Skin: Positive for itching and rash.  Endo/Heme/Allergies: Bruises/bleeds easily.  Psychiatric/Behavioral: The patient is  nervous/anxious.  All other systems reviewed and are negative.   Medical History: Past Medical History:  Diagnosis Date   Arthritis   Diabetes mellitus without complication (CMS-HCC)   History of cancer   Hyperlipidemia   Patient Active Problem List  Diagnosis   Cancer of pancreas, tail (CMS-HCC)   Anxiety and depression   H/O splenectomy   History reviewed. No pertinent surgical history.   Allergies  Allergen Reactions   Morphine Shortness Of Breath   Current Outpatient Medications on File Prior to Visit  Medication Sig Dispense Refill   ALPRAZolam (XANAX) 0.5 MG tablet alprazolam 0.5 mg tablet   gabapentin (NEURONTIN) 300 MG capsule gabapentin 300 mg capsule   HYDROcodone-acetaminophen (NORCO) 7.5-325 mg tablet hydrocodone 7.5 mg-acetaminophen 325 mg tablet TAKE 1 TABLET BY MOUTH EVERY 8 HOURS AS NEEDED FOR SEVERE PAIN   prochlorperazine (COMPAZINE) 10 MG tablet Take 1 tablet (10 mg total) by mouth every 6 (six) hours as needed   celecoxib (CELEBREX) 200 MG capsule celecoxib 200 mg capsule TAKE 1 CAPSULE BY MOUTH EVERY DAY AS NEEDED   meclizine (ANTIVERT) 12.5 mg tablet meclizine 12.5 mg tablet   metoprolol succinate (TOPROL-XL) 25 MG XL tablet metoprolol succinate ER 25 mg tablet,extended release 24 hr   ranitidine (ZANTAC) 150 MG tablet ranitidine 150 mg tablet TK 1 T PO BID   rosuvastatin (CRESTOR) 10 MG tablet   sulfamethoxazole-trimethoprim (BACTRIM DS) 800-160 mg tablet sulfamethoxazole 800 mg-trimethoprim 160 mg tablet TAKE 1 TABLET BY MOUTH EVERY 12 HOURS  FOR 5 DAYS   triamcinolone 0.1 % cream triamcinolone acetonide 0.1 % topical cream   No current facility-administered medications on file prior to visit.   Family History  Problem Relation Age of Onset   Skin cancer Mother   Deep vein thrombosis (DVT or abnormal blood clot formation) Mother   High blood pressure (Hypertension) Father   Coronary Artery Disease (Blocked arteries around heart) Father     Social History   Tobacco Use  Smoking Status Every Day   Types: Cigarettes  Smokeless Tobacco Never    Social History   Socioeconomic History   Marital status: Married  Tobacco Use   Smoking status: Every Day  Types: Cigarettes   Smokeless tobacco: Never  Substance and Sexual Activity   Alcohol use: Never   Drug use: Never   Objective:   Vitals:  05/28/21 0941  BP: (!) 144/72  Pulse: 89  Temp: 36.7 C (98.1 F)  SpO2: 98%  Weight: 76 kg (167 lb 9.6 oz)  Height: 157.5 cm (5\' 2" )   Body mass index is 30.65 kg/m.  Head: Normocephalic and atraumatic.  Eyes: Conjunctivae are normal. Pupils are equal, round, and reactive to light. No scleral icterus.  Neck: Normal range of motion. Neck supple. No tracheal deviation present. No thyromegaly present.  Resp: No respiratory distress, normal effort. Abd: Abdomen is soft, non distended and non tender. No masses are palpable. There is no rebound and no guarding.  Neurological: Alert and oriented to person, place, and time. Coordination normal.  Skin: Skin is warm and dry. Mild candidiasis under pannus. No diaphoretic. No erythema. No pallor. No incisional hernias noted. Psychiatric: Normal mood and affect. Normal behavior. Judgment and thought content normal.   Labs, Imaging and Diagnostic Testing:  Chest CT 04/24/21 IMPRESSION: 1. 4 mm right middle lobe nodule, similar to minimally smaller than on 11/13/2020. 2. Residual small fluid collection in the body/tail of the pancreas may represent a postoperative seroma or pseudocyst. 3. Bilateral adrenal adenomas. 4. Aortic atherosclerosis (ICD10-I70.0). 5. Emphysema (ICD10-J43.9).   CBC, CMET essentially normal. CA 19-9 pending.   Assessment and Plan:  Diagnoses and all orders for this visit:  Cancer of pancreas, tail (CMS-HCC)   No clinical evidence of disease. Will plan port removal.  No follow-ups on file.  Georgianne Fick, MD

## 2021-06-12 ENCOUNTER — Encounter (HOSPITAL_COMMUNITY): Payer: Self-pay | Admitting: General Surgery

## 2021-06-12 NOTE — Progress Notes (Signed)
Anesthesia Chart Review:  Case: [id number] Date/Time: 06/13/21 1545   Procedure: PORT REMOVAL   Anesthesia type: Monitor Anesthesia Care   Pre-op diagnosis: PORT IN PLACE   Location: [facility] OR ROOM 02 / [place] [facility]   Surgeons: [name], MD       DISCUSSION: Patient is a 75 year old female scheduled for the above procedure.  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 Pylori (with gastritis 08/16/20), COVID-19 (07/25/20).   She underwent cardiology evaluation by Dr. Marlou Porch on 04/10/20 for palpitations and chest tightness. Coronary CT and echo ordered with follow-up to be determined based on testing. Echo showed EF 63%, no regional wall motion abnormalities, grade 1 diastolic dysfunction, trivial MR. CCTA showed minimal CAD with 0-24% RCA and incidental finding of 5 mm RML lung nodule. Crestor recommended and repeat chest CT in 12 months. (04/23/21 chest CT showed 4 mm right middle lobe nodule, similar to minimally smaller than on 11/13/20.).  She had labs (CBC with Diff, CMP) on 05/22/21 per [facility]. Anesthesia team evaluation on the day of surgery.     VS:  BP Readings from Last 3 Encounters:  05/22/21 126/74  04/29/21 120/62  04/24/21 125/68   Pulse Readings from Last 3 Encounters:  05/22/21 76  04/29/21 67  04/24/21 83     PROVIDERS: Plotnikov, Evie Lacks, MD is PCP. Last visit 04/29/21.  Ladell Pier, MD is HEM-ONC. Last visit 05/22/21.  Patient in clinical remission from pancreatic cancer at that time.  Referred to Dr. Barry Dienes for port removal. - Mansouraty, Valarie Merino, MD & Scarlette Shorts, MD are  GI - Candee Furbish, MD is cardiologist   LABS: Lab results as of 05/22/21 include: Lab Results  Component Value Date   WBC 7.8 05/22/2021   HGB 12.8 05/22/2021   HCT 38.8 05/22/2021   PLT 278 05/22/2021   GLUCOSE 171 (H) 05/22/2021   ALT 13 05/22/2021   AST 21 05/22/2021   NA 139 05/22/2021   K 4.0 05/22/2021   CL 105 05/22/2021   CREATININE 0.47 05/22/2021   BUN 14 05/22/2021   CO2 27 05/22/2021   INR 1.1 (H) 08/22/2020   HGBA1C 5.9 (H) 10/17/2020     IMAGES: CT Chest 04/23/21: IMPRESSION: 1. 4 mm right middle lobe nodule, similar to minimally smaller than on 11/13/2020. 2. Residual small fluid collection in the body/tail of the pancreas may represent a postoperative seroma or pseudocyst. 3. Bilateral adrenal adenomas. 4.  Aortic atherosclerosis (ICD10-I70.0). 5.  Emphysema (ICD10-J43.9).    EKG: 07/25/20: Normal sinus rhythm Nonspecific ST and T wave abnormality Abnormal ECG When compared with ECG of 02/08/2018, No significant change was found Confirmed by Delora Fuel ([id number]) on 07/26/2020 12:32:28 AM     CV: CT Coronary 04/24/20: - Aorta:  Normal size.  No calcifications.  No dissection. - Aortic Valve:  Trileaflet.  No calcifications. - Coronary Arteries:   Normal coronary origin.  Right dominance. RCA is a large dominant artery that gives rise to PDA and PLA. Mild RCA prox/mid calcified plaque, 0-24% stenosis. Left main is a large artery that gives rise to LAD and LCX arteries. LAD is a large vessel that has  no plaque. LCX is a non-dominant artery that gives rise to one large OM1 branch. There is no plaque. - Other findings: Normal pulmonary vein drainage into the left atrium. Normal left atrial appendage without a thrombus. Normal size of the pulmonary artery. Please see radiology report for non cardiac findings.   IMPRESSION: 1. Coronary calcium score of 8. This was 56 percentile for age and sex matched control. 2. Normal coronary origin with right  dominance. 3.  Mild RCA calcified plaque, 0-24% stenosis. 4. CAD-RADS 1. Minimal non-obstructive CAD (0-24%). Consider non-atherosclerotic causes of chest pain. Consider preventive therapy and risk factor modification.     Echo 03/13/20: IMPRESSIONS   1. Left ventricular ejection fraction by 3D volume is 63 %. The left  ventricle has normal function. The left ventricle has no regional wall  motion abnormalities. Left ventricular diastolic parameters are consistent  with Grade I diastolic dysfunction  (impaired relaxation).   2. Right ventricular systolic function is normal. The right ventricular  size is normal. Tricuspid regurgitation signal is inadequate for assessing  PA pressure.   3. The mitral valve is normal in structure. Trivial mitral valve  regurgitation. No evidence of mitral stenosis.   4. The aortic valve is grossly normal. Aortic valve regurgitation is not  visualized. No aortic stenosis is present.   5. The inferior vena cava is normal in size with greater than 50%  respiratory variability, suggesting right atrial pressure of 3 mmHg.   CV:  Past Medical History:  Diagnosis Date   Abdominal pain, epigastric 12/18/2009   ANXIETY 05/25/2006   BRONCHITIS, ACUTE 40/97/3532   Complication of anesthesia     difficulty waking up x 1 - "years ago"   Constipation    COPD 05/25/2006   DEPRESSION 01/02/2006   ESOTROPIA, LEFT EYE 1948[birth year]03/[birth year]   Family history of breast cancer 11/26/2020   FIBROMYALGIA 04/02/2006   GERD (gastroesophageal reflux disease)    Headache(784.0) 12/08/2007   History of blood transfusion    HYPERLIPIDEMIA 04/30/2006   HYPERTENSION 05/25/2006   Hypertension    INSOMNIA, PERSISTENT 02/22/2008   Lazy eye    left - no surgery   LOW BACK PAIN 01/27/2007   Migraines    NEURALGIA, 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/2009   Neuropathy    OBSTRUCTIVE SLEEP APNEA 04/17/2010   does not use CPAP   OSTEOARTHRITIS 05/25/2006   pancreatic ca dx'd 06/2020   Polyuria     Pre-diabetes    diet controlled   Psychosomatic disease 2011   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   T M J 05/11/2009    Past Surgical History:  Procedure Laterality Date   BIOPSY  08/16/2020   Procedure: BIOPSY;  Surgeon: Irving Copas., MD;  Location: [facility] ENDOSCOPY;  Service: Endoscopy;;   CHOLECYSTECTOMY     COLONOSCOPY     ESOPHAGOGASTRODUODENOSCOPY (EGD) WITH PROPOFOL N/A 08/16/2020   Procedure: ESOPHAGOGASTRODUODENOSCOPY (EGD) WITH PROPOFOL;  Surgeon: Irving Copas., MD;  Location: [facility] ENDOSCOPY;  Service: Endoscopy;  Laterality: N/A;   EUS N/A 08/16/2020   Procedure: UPPER ENDOSCOPIC ULTRASOUND (EUS) RADIAL;  Surgeon: Rush Landmark Telford Nab., MD;  Location: [place];  Service: Endoscopy;  Laterality: N/A;   EYE SURGERY Bilateral    cataract with lens implant   FINE NEEDLE ASPIRATION  08/16/2020   Procedure: FINE NEEDLE ASPIRATION (FNA) LINEAR;  Surgeon: Irving Copas., MD;  Location: [place];  Service: Endoscopy;;   MASS EXCISION Left 10/06/2016   Procedure: EXCISION LEFT BUTTOCKS MASS;  Surgeon: Alphonsa Overall, MD;  Location:  [place];  Service: General;  Laterality: Left;   PORTACATH PLACEMENT N/A 11/14/2020   Procedure: INSERTION PORT-A-CATH;  Surgeon: [name], MD;  Location: [place];  Service: General;  Laterality: N/A;   XI ROBOTIC ASSISTED LAPAROSCOPIC DISTAL PANCREATECTOMY N/A 10/17/2020   Procedure: XI ROBOTIC ASSISTED DISTAL PANCREATECTOMY AND SPLENECTOMY;  Surgeon: [name], MD;  Location: [place];  Service: General;  Laterality: N/A;    MEDICATIONS: No current facility-administered medications for this encounter.    ALPRAZolam (XANAX) 0.5 MG tablet   HYDROcodone-acetaminophen (NORCO) 7.5-325 MG tablet   ibuprofen (ADVIL) 200 MG tablet   rosuvastatin (CRESTOR) 10 MG tablet   gabapentin (NEURONTIN) 300 MG capsule   lidocaine-prilocaine (EMLA) cream   omeprazole (PRILOSEC) 40 MG capsule   ondansetron (ZOFRAN) 8 MG tablet    prochlorperazine (COMPAZINE) 10 MG tablet    Myra Gianotti, PA-C Surgical Short Stay/Anesthesiology [facility] Phone [phone] [facility] Phone [phone] 06/12/2021 2:44 PM

## 2021-06-12 NOTE — Progress Notes (Signed)
EKG:07/26/20 CXR: na ECHO:03/13/21 Stress Test: denies Cardiac Cath: denies  Fasting Blood Sugar- does not check glucose.  Pre-diabetic Checks Blood Sugar__0_ times a day  OSA: Yes, does not wear cpap  ASA/Blood Thinner: No  No covid test needed - ambulatory.  Anesthesia Review: No, patient denies cardiac history.  Patient denies shortness of breath, fever, cough, and chest pain at PAT appointment.  Patient verbalized understanding of instructions provided today at the PAT appointment.  Patient asked to review instructions at home and day of surgery.

## 2021-06-12 NOTE — Anesthesia Preprocedure Evaluation (Addendum)
Anesthesia Evaluation  Patient identified by MRN, date of birth, ID band Patient awake    Reviewed: Allergy & Precautions, NPO status , Patient's Chart, lab work & pertinent test results  Airway Mallampati: II  TM Distance: >3 FB     Dental   Pulmonary shortness of breath, sleep apnea , COPD, former smoker,    breath sounds clear to auscultation       Cardiovascular hypertension, + CAD   Rhythm:Regular Rate:Normal     Neuro/Psych  Headaches, PSYCHIATRIC DISORDERS  Neuromuscular disease    GI/Hepatic GERD  ,  Endo/Other  diabetes  Renal/GU Renal disease     Musculoskeletal  (+) Arthritis ,   Abdominal   Peds  Hematology   Anesthesia Other Findings   Reproductive/Obstetrics                           Anesthesia Physical Anesthesia Plan  ASA: 3  Anesthesia Plan: MAC   Post-op Pain Management:    Induction: Intravenous  PONV Risk Score and Plan: 3 and Ondansetron, Propofol infusion and Dexamethasone  Airway Management Planned: Nasal Cannula and Simple Face Mask  Additional Equipment:   Intra-op Plan:   Post-operative Plan:   Informed Consent: I have reviewed the patients History and Physical, chart, labs and discussed the procedure including the risks, benefits and alternatives for the proposed anesthesia with the patient or authorized representative who has indicated his/her understanding and acceptance.     Dental advisory given  Plan Discussed with: CRNA and Anesthesiologist  Anesthesia Plan Comments: (PAT note written 06/12/2021 by Myra Gianotti, PA-C. )       Anesthesia Quick Evaluation

## 2021-06-13 ENCOUNTER — Encounter (HOSPITAL_COMMUNITY)
Admission: RE | Disposition: A | Discharge status: Home / Self Care | Payer: Self-pay | Source: Home / Self Care | Attending: General Surgery

## 2021-06-13 ENCOUNTER — Ambulatory Visit (HOSPITAL_COMMUNITY): Payer: Medicare Other | Admitting: Vascular Surgery

## 2021-06-13 ENCOUNTER — Other Ambulatory Visit: Payer: Self-pay

## 2021-06-13 ENCOUNTER — Ambulatory Visit (HOSPITAL_COMMUNITY)
Admission: RE | Admit: 2021-06-13 | Discharge: 2021-06-13 | Disposition: A | Discharge status: Home / Self Care | Payer: Medicare Other | Attending: General Surgery | Admitting: General Surgery

## 2021-06-13 ENCOUNTER — Encounter (HOSPITAL_COMMUNITY): Payer: Self-pay | Admitting: General Surgery

## 2021-06-13 DIAGNOSIS — I2510 Atherosclerotic heart disease of native coronary artery without angina pectoris: Secondary | ICD-10-CM | POA: Insufficient documentation

## 2021-06-13 DIAGNOSIS — E785 Hyperlipidemia, unspecified: Secondary | ICD-10-CM | POA: Insufficient documentation

## 2021-06-13 DIAGNOSIS — G4733 Obstructive sleep apnea (adult) (pediatric): Secondary | ICD-10-CM | POA: Insufficient documentation

## 2021-06-13 DIAGNOSIS — Z87891 Personal history of nicotine dependence: Secondary | ICD-10-CM | POA: Diagnosis not present

## 2021-06-13 DIAGNOSIS — C252 Malignant neoplasm of tail of pancreas: Secondary | ICD-10-CM | POA: Diagnosis not present

## 2021-06-13 DIAGNOSIS — Z8616 Personal history of COVID-19: Secondary | ICD-10-CM | POA: Diagnosis not present

## 2021-06-13 DIAGNOSIS — Z9049 Acquired absence of other specified parts of digestive tract: Secondary | ICD-10-CM | POA: Insufficient documentation

## 2021-06-13 DIAGNOSIS — I10 Essential (primary) hypertension: Secondary | ICD-10-CM | POA: Diagnosis not present

## 2021-06-13 DIAGNOSIS — Z9081 Acquired absence of spleen: Secondary | ICD-10-CM | POA: Diagnosis not present

## 2021-06-13 DIAGNOSIS — M797 Fibromyalgia: Secondary | ICD-10-CM | POA: Diagnosis not present

## 2021-06-13 DIAGNOSIS — Z90411 Acquired partial absence of pancreas: Secondary | ICD-10-CM | POA: Diagnosis not present

## 2021-06-13 DIAGNOSIS — Z452 Encounter for adjustment and management of vascular access device: Secondary | ICD-10-CM | POA: Diagnosis not present

## 2021-06-13 DIAGNOSIS — F419 Anxiety disorder, unspecified: Secondary | ICD-10-CM | POA: Insufficient documentation

## 2021-06-13 DIAGNOSIS — E119 Type 2 diabetes mellitus without complications: Secondary | ICD-10-CM | POA: Diagnosis not present

## 2021-06-13 DIAGNOSIS — J449 Chronic obstructive pulmonary disease, unspecified: Secondary | ICD-10-CM | POA: Diagnosis not present

## 2021-06-13 DIAGNOSIS — J441 Chronic obstructive pulmonary disease with (acute) exacerbation: Secondary | ICD-10-CM | POA: Diagnosis not present

## 2021-06-13 HISTORY — PX: PORT-A-CATH REMOVAL: SHX5289

## 2021-06-13 LAB — GLUCOSE, CAPILLARY: Glucose-Capillary: 100 mg/dL — ABNORMAL HIGH (ref 70–99)

## 2021-06-13 SURGERY — REMOVAL PORT-A-CATH
Anesthesia: Monitor Anesthesia Care

## 2021-06-13 MED ORDER — LIDOCAINE HCL 1 % IJ SOLN
INTRAMUSCULAR | Status: DC | PRN
  Administered 2021-06-13: 7 mL via SUBCUTANEOUS

## 2021-06-13 MED ORDER — LIDOCAINE 2% (20 MG/ML) 5 ML SYRINGE
INTRAMUSCULAR | Status: DC | PRN
  Administered 2021-06-13: 80 mg via INTRAVENOUS

## 2021-06-13 MED ORDER — MIDAZOLAM HCL 2 MG/2ML IJ SOLN
INTRAMUSCULAR | Status: AC
  Filled 2021-06-13: qty 2

## 2021-06-13 MED ORDER — LIDOCAINE HCL (PF) 1 % IJ SOLN
INTRAMUSCULAR | Status: AC
  Filled 2021-06-13: qty 30

## 2021-06-13 MED ORDER — FENTANYL CITRATE (PF) 250 MCG/5ML IJ SOLN
INTRAMUSCULAR | Status: DC | PRN
  Administered 2021-06-13: 25 ug via INTRAVENOUS

## 2021-06-13 MED ORDER — CHLORHEXIDINE GLUCONATE 0.12 % MT SOLN
OROMUCOSAL | Status: AC
  Administered 2021-06-13: 15 mL via OROMUCOSAL
  Filled 2021-06-13: qty 15

## 2021-06-13 MED ORDER — DEXAMETHASONE SODIUM PHOSPHATE 10 MG/ML IJ SOLN
INTRAMUSCULAR | Status: DC | PRN
  Administered 2021-06-13: 5 mg via INTRAVENOUS

## 2021-06-13 MED ORDER — ACETAMINOPHEN 500 MG PO TABS: 1000.0000 mg | ORAL_TABLET | ORAL | Status: AC

## 2021-06-13 MED ORDER — CEFAZOLIN SODIUM-DEXTROSE 2-4 GM/100ML-% IV SOLN
INTRAVENOUS | Status: AC
  Filled 2021-06-13: qty 100

## 2021-06-13 MED ORDER — CHLORHEXIDINE GLUCONATE CLOTH 2 % EX PADS
6.0000 | MEDICATED_PAD | Freq: Once | CUTANEOUS | Status: AC
  Administered 2021-06-13: 6 via TOPICAL

## 2021-06-13 MED ORDER — PROPOFOL 10 MG/ML IV BOLUS
INTRAVENOUS | Status: DC | PRN
  Administered 2021-06-13: 30 mg via INTRAVENOUS

## 2021-06-13 MED ORDER — PROPOFOL 10 MG/ML IV BOLUS
INTRAVENOUS | Status: AC
  Filled 2021-06-13: qty 20

## 2021-06-13 MED ORDER — ACETAMINOPHEN 500 MG PO TABS
ORAL_TABLET | ORAL | Status: AC
  Administered 2021-06-13: 1000 mg via ORAL
  Filled 2021-06-13: qty 2

## 2021-06-13 MED ORDER — FENTANYL CITRATE (PF) 250 MCG/5ML IJ SOLN
INTRAMUSCULAR | Status: AC
  Filled 2021-06-13: qty 5

## 2021-06-13 MED ORDER — BUPIVACAINE-EPINEPHRINE (PF) 0.25% -1:200000 IJ SOLN
INTRAMUSCULAR | Status: AC
  Filled 2021-06-13: qty 30

## 2021-06-13 MED ORDER — LACTATED RINGERS IV SOLN: INTRAVENOUS | Status: DC

## 2021-06-13 MED ORDER — ORAL CARE MOUTH RINSE: 15.0000 mL | Freq: Once | OROMUCOSAL | Status: AC

## 2021-06-13 MED ORDER — CHLORHEXIDINE GLUCONATE CLOTH 2 % EX PADS: 6.0000 | MEDICATED_PAD | Freq: Once | CUTANEOUS | Status: DC

## 2021-06-13 MED ORDER — CEFAZOLIN SODIUM-DEXTROSE 2-4 GM/100ML-% IV SOLN
2.0000 g | INTRAVENOUS | Status: AC
  Administered 2021-06-13: 2 g via INTRAVENOUS

## 2021-06-13 MED ORDER — CHLORHEXIDINE GLUCONATE 0.12 % MT SOLN: 15.0000 mL | Freq: Once | OROMUCOSAL | Status: AC

## 2021-06-13 MED ORDER — ONDANSETRON HCL 4 MG/2ML IJ SOLN
INTRAMUSCULAR | Status: DC | PRN
  Administered 2021-06-13: 4 mg via INTRAVENOUS

## 2021-06-13 MED ORDER — PROPOFOL 500 MG/50ML IV EMUL
INTRAVENOUS | Status: DC | PRN
  Administered 2021-06-13: 125 ug/kg/min via INTRAVENOUS

## 2021-06-13 SURGICAL SUPPLY — 32 items
ADH SKN CLS APL DERMABOND .7 (GAUZE/BANDAGES/DRESSINGS) ×1
BAG COUNTER SPONGE SURGICOUNT (BAG) ×2 IMPLANT
BAG SPNG CNTER NS LX DISP (BAG) ×1
BAG SURGICOUNT SPONGE COUNTING (BAG) ×1
CHLORAPREP W/TINT 10.5 ML (MISCELLANEOUS) ×3 IMPLANT
COVER SURGICAL LIGHT HANDLE (MISCELLANEOUS) ×3 IMPLANT
DECANTER SPIKE VIAL GLASS SM (MISCELLANEOUS) ×6 IMPLANT
DERMABOND ADVANCED (GAUZE/BANDAGES/DRESSINGS) ×2
DERMABOND ADVANCED .7 DNX12 (GAUZE/BANDAGES/DRESSINGS) ×1 IMPLANT
DRAPE LAPAROTOMY 100X72 PEDS (DRAPES) ×3 IMPLANT
ELECT CAUTERY BLADE 6.4 (BLADE) ×3 IMPLANT
ELECT REM PT RETURN 9FT ADLT (ELECTROSURGICAL) ×3
ELECTRODE REM PT RTRN 9FT ADLT (ELECTROSURGICAL) ×1 IMPLANT
GAUZE 4X4 16PLY ~~LOC~~+RFID DBL (SPONGE) ×3 IMPLANT
GLOVE SURG ENC MOIS LTX SZ6 (GLOVE) ×3 IMPLANT
GLOVE SURG UNDER LTX SZ6.5 (GLOVE) ×3 IMPLANT
GOWN STRL REUS W/ TWL LRG LVL3 (GOWN DISPOSABLE) ×1 IMPLANT
GOWN STRL REUS W/TWL 2XL LVL3 (GOWN DISPOSABLE) ×3 IMPLANT
GOWN STRL REUS W/TWL LRG LVL3 (GOWN DISPOSABLE) ×3
KIT BASIN OR (CUSTOM PROCEDURE TRAY) ×3 IMPLANT
KIT TURNOVER KIT B (KITS) ×3 IMPLANT
NDL HYPO 25GX1X1/2 BEV (NEEDLE) ×1 IMPLANT
NEEDLE HYPO 25GX1X1/2 BEV (NEEDLE) ×3 IMPLANT
NS IRRIG 1000ML POUR BTL (IV SOLUTION) ×3 IMPLANT
PACK GENERAL/GYN (CUSTOM PROCEDURE TRAY) ×3 IMPLANT
PAD ARMBOARD 7.5X6 YLW CONV (MISCELLANEOUS) ×6 IMPLANT
SUT MON AB 4-0 PC3 18 (SUTURE) ×3 IMPLANT
SUT VIC AB 3-0 SH 27 (SUTURE) ×3
SUT VIC AB 3-0 SH 27X BRD (SUTURE) ×1 IMPLANT
SYR CONTROL 10ML LL (SYRINGE) ×3 IMPLANT
TOWEL GREEN STERILE (TOWEL DISPOSABLE) ×3 IMPLANT
TOWEL GREEN STERILE FF (TOWEL DISPOSABLE) ×3 IMPLANT

## 2021-06-13 NOTE — Anesthesia Postprocedure Evaluation (Signed)
Anesthesia Post Note  Patient: Amanda Arnold  Procedure(s) Performed: PORT REMOVAL     Patient location during evaluation: PACU Anesthesia Type: MAC Level of consciousness: awake and alert Pain management: pain level controlled Vital Signs Assessment: post-procedure vital signs reviewed and stable Respiratory status: spontaneous breathing, nonlabored ventilation, respiratory function stable and patient connected to nasal cannula oxygen Cardiovascular status: stable and blood pressure returned to baseline Postop Assessment: no apparent nausea or vomiting Anesthetic complications: no   No notable events documented.  Last Vitals:  Vitals:   06/13/21 1732 06/13/21 1745  BP: 126/61   Pulse: 61   Resp: 10   Temp:  36.6 C  SpO2: 95%     Last Pain:  Vitals:   06/13/21 1745  TempSrc:   PainSc: 1                  Catalina Gravel

## 2021-06-13 NOTE — Transfer of Care (Signed)
Immediate Anesthesia Transfer of Care Note  Patient: Amanda Arnold  Procedure(s) Performed: PORT REMOVAL  Patient Location: PACU  Anesthesia Type:MAC  Level of Consciousness: awake, alert  and patient cooperative  Airway & Oxygen Therapy: Patient Spontanous Breathing  Post-op Assessment: Report given to RN, Post -op Vital signs reviewed and stable and Patient moving all extremities X 4  Post vital signs: Reviewed and stable  Last Vitals:  Vitals Value Taken Time  BP 114/58   Temp    Pulse 70 06/13/21 1717  Resp    SpO2 96 % 06/13/21 1717  Vitals shown include unvalidated device data.  Last Pain:  Vitals:   06/13/21 1353  TempSrc:   PainSc: 0-No pain         Complications: No notable events documented.

## 2021-06-13 NOTE — Anesthesia Procedure Notes (Signed)
Procedure Name: MAC Date/Time: 06/13/2021 4:48 PM Performed by: Rande Brunt, CRNA Pre-anesthesia Checklist: Patient identified, Emergency Drugs available, Suction available and Patient being monitored Patient Re-evaluated:Patient Re-evaluated prior to induction Oxygen Delivery Method: Nasal cannula Preoxygenation: Pre-oxygenation with 100% oxygen Induction Type: IV induction Placement Confirmation: positive ETCO2 and CO2 detector Dental Injury: Teeth and Oropharynx as per pre-operative assessment

## 2021-06-13 NOTE — Interval H&P Note (Signed)
History and Physical Interval Note:  06/13/2021 3:45 PM  Amanda Arnold  has presented today for surgery, with the diagnosis of PORT IN PLACE.  The various methods of treatment have been discussed with the patient and family. After consideration of risks, benefits and other options for treatment, the patient has consented to  Procedure(s): PORT REMOVAL (N/A) as a surgical intervention.  The patient's history has been reviewed, patient examined, no change in status, stable for surgery.  I have reviewed the patient's chart and labs.  Questions were answered to the patient's satisfaction.     [name]

## 2021-06-13 NOTE — Op Note (Signed)
  PRE-OPERATIVE DIAGNOSIS:  un-needed Port-A-Cath for pancreatic cancer  POST-OPERATIVE DIAGNOSIS:  Same   PROCEDURE:  Procedure(s):  REMOVAL PORT-A-CATH  SURGEON:  Surgeon(s):  [name], MD  ANESTHESIA:   MAC + local  EBL:   Minimal  SPECIMEN:  None  Complications : none known  Procedure:   Pt was  identified in the holding area and taken to the operating room where she was placed supine on the operating room table.  MAC anesthesia was induced.  The left upper chest was prepped and draped.  The prior incision was anesthetized with local anesthetic.  The incision was opened with a #15 blade.  The subcutaneous tissue was divided with the cautery.  The port was identified and the capsule opened.  The four 2-0 prolene sutures were removed.  The port was then removed and pressure held on the tract.  The catheter appeared intact without evidence of breakage, length was 24.5 cm.  The wound was inspected for hemostasis, which was achieved with cautery.  The wound was closed with 3-0 vicryl deep dermal interrupted sutures and 4-0 Monocryl running subcuticular suture.  The wound was cleaned, dried, and dressed with dermabond.  The patient was awakened from anesthesia and taken to the PACU in stable condition.  Needle, sponge, and instrument counts are correct.

## 2021-06-13 NOTE — Discharge Instructions (Addendum)
[facility] Office Phone Number [phone]   POST OP INSTRUCTIONS  Always review your discharge instruction sheet given to you by the facility where your surgery was performed.  IF YOU HAVE DISABILITY OR FAMILY LEAVE FORMS, YOU MUST BRING THEM TO THE OFFICE FOR PROCESSING.  DO NOT GIVE THEM TO YOUR DOCTOR.  A prescription for pain medication may be given to you upon discharge.  Take your pain medication as prescribed, if needed.  If narcotic pain medicine is not needed, then you may take acetaminophen (Tylenol) or ibuprofen (Advil) as needed. Take your usually prescribed medications unless otherwise directed If you need a refill on your pain medication, please contact your pharmacy.  They will contact our office to request authorization.  Prescriptions will not be filled after 5pm or on week-ends. You should eat very light the first 24 hours after surgery, such as soup, crackers, pudding, etc.  Resume your normal diet the day after surgery It is common to experience some constipation if taking pain medication after surgery.  Increasing fluid intake and taking a stool softener will usually help or prevent this problem from occurring.  A mild laxative (Milk of Magnesia or Miralax) should be taken according to package directions if there are no bowel movements after 48 hours. You may shower in 48 hours.  The surgical glue will flake off in 2-3 weeks.   ACTIVITIES:  No strenuous activity or heavy lifting for 1 week.   You may drive when you no longer are taking prescription pain medication, you can comfortably wear a seatbelt, and you can safely maneuver your car and apply brakes. RETURN TO WORK:  __________n/a_______________ You should see your doctor in the office for a follow-up appointment approximately three-four weeks after your surgery.    WHEN TO CALL YOUR DOCTOR: Fever over 101.0 Nausea and/or vomiting. Extreme swelling or bruising. Continued bleeding from  incision. Increased pain, redness, or drainage from the incision.  The clinic staff is available to answer your questions during regular business hours.  Please don't hesitate to call and ask to speak to one of the nurses for clinical concerns.  If you have a medical emergency, go to the nearest emergency room or call 911.  A surgeon from [facility] is always on call at the hospital.  For further questions, please visit [email or web address]   

## 2021-06-14 ENCOUNTER — Encounter (HOSPITAL_COMMUNITY): Payer: Self-pay | Admitting: General Surgery

## 2021-06-18 ENCOUNTER — Other Ambulatory Visit: Payer: Self-pay | Admitting: Cardiology

## 2021-06-24 NOTE — Progress Notes (Signed)
Office Visit    Patient Name: Amanda Arnold Date of Encounter: 06/25/2021  Primary Care Provider:  Cassandria Anger, MD Primary Cardiologist:  Candee Furbish, MD  Chief Complaint    75 year old female with a history of atypical chest pain, coronary artery calcification noted on CT, palpitations, hypertension, hyperlipidemia, GERD, COPD, R lung nodule, former tobacco use, pancreatic cancer s/p distal pancreatectomy/splenectomy, OSA, trigeminal neuralgia, fibromyalgia, anxiety, and depression presents for follow-up related to atypical chest pain and hypertension.  Past Medical History    Past Medical History:  Diagnosis Date   Abdominal pain, epigastric 12/18/2009   ANXIETY 05/25/2006   BRONCHITIS, ACUTE 94/85/4627   Complication of anesthesia     difficulty waking up x 1 - "years ago"   Constipation    COPD 05/25/2006   DEPRESSION 01/02/2006   ESOTROPIA, LEFT EYE Oct 11, 1945   Family history of breast cancer 11/26/2020   FIBROMYALGIA 04/02/2006   GERD (gastroesophageal reflux disease)    Headache(784.0) 12/08/2007   History of blood transfusion    HYPERLIPIDEMIA 04/30/2006   HYPERTENSION 05/25/2006   Hypertension    INSOMNIA, PERSISTENT 02/22/2008   Lazy eye    left - no surgery   LOW BACK PAIN 01/27/2007   Migraines    NEURALGIA, 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/2009   Neuropathy    OBSTRUCTIVE SLEEP APNEA 04/17/2010   does not use CPAP   OSTEOARTHRITIS 05/25/2006   pancreatic ca dx'd 06/2020   Polyuria    Pre-diabetes    diet controlled   Psychosomatic disease 2011   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   T M J 05/11/2009   Past Surgical History:  Procedure Laterality Date   BIOPSY  08/16/2020   Procedure: BIOPSY;  Surgeon: Irving Copas., MD;  Location: [facility] ENDOSCOPY;  Service: Endoscopy;;   CHOLECYSTECTOMY     COLONOSCOPY     ESOPHAGOGASTRODUODENOSCOPY (EGD) WITH PROPOFOL N/A 08/16/2020   Procedure: ESOPHAGOGASTRODUODENOSCOPY (EGD) WITH PROPOFOL;  Surgeon:  Irving Copas., MD;  Location: [facility] ENDOSCOPY;  Service: Endoscopy;  Laterality: N/A;   EUS N/A 08/16/2020   Procedure: UPPER ENDOSCOPIC ULTRASOUND (EUS) RADIAL;  Surgeon: Rush Landmark Telford Nab., MD;  Location: [place];  Service: Endoscopy;  Laterality: N/A;   EYE SURGERY Bilateral    cataract with lens implant   FINE NEEDLE ASPIRATION  08/16/2020   Procedure: FINE NEEDLE ASPIRATION (FNA) LINEAR;  Surgeon: Irving Copas., MD;  Location: [place];  Service: Endoscopy;;   MASS EXCISION Left 10/06/2016   Procedure: EXCISION LEFT BUTTOCKS MASS;  Surgeon: Alphonsa Overall, MD;  Location: [employer];  Service: General;  Laterality: Left;   PORT-A-CATH REMOVAL N/A 06/13/2021   Procedure: PORT REMOVAL;  Surgeon: [name], MD;  Location: [place];  Service: General;  Laterality: N/A;   [place] N/A 11/14/2020   Procedure: INSERTION PORT-A-CATH;  Surgeon: [name], MD;  Location: [place];  Service: General;  Laterality: N/A;   XI ROBOTIC ASSISTED LAPAROSCOPIC DISTAL PANCREATECTOMY N/A 10/17/2020   Procedure: XI ROBOTIC ASSISTED DISTAL PANCREATECTOMY AND SPLENECTOMY;  Surgeon: [name], MD;  Location: [place];  Service: General;  Laterality: N/A;    Allergies  Allergies  Allergen Reactions   Morphine And Related Shortness Of Breath   Bupropion Hcl Other (See Comments)    "does not feel right"   Ezetimibe Other (See Comments)    REACTION: legs burning   Metformin And Related Diarrhea    Diarrhea w/XR or regular    Naproxen     Upset stomach,  diarrhea   Trazodone Hcl Swelling   Tramadol Hcl Palpitations    REACTION: palpitations    History of Present Illness    75 year old female with the above past medical history including atypical chest pain, coronary artery calcification noted on CT, palpitations, hypertension, hyperlipidemia, GERD, COPD, R lung nodule, former tobacco use, pancreatic cancer s/p distal pancreatectomy/splenectomy, OSA,  trigeminal neuralgia, fibromyalgia, anxiety, and depression.   History of normal MV in 2016. She was last seen in the office by Dr. Marlou Porch 04/10/2020 and reported palpitations and bandlike chest wall tightness. Echo at the time showed EF 63%, GIDD, trivial MV regurgitation. Coronary CTA showed calcified plaque in the RCA, nonflow limiting, otherwise normal coronaries and presence of right lung nodule.  Follow-up chest CT 04/2021 showed stable lung nodule, reduced in size, with no recommendation for repeat scan.  She was diagnosed with pancreatic cancer in April 2022 and underwent pancreatectomy/splenectomy and chemo.  Presents today for annual follow-up.  Since her last visit she is doing well overall from a cardiac standpoint. She denies any symptoms of angina. She reports rare fleeting palpitations with no associated symptoms. Her blood pressure is low today.  She states her blood pressure normally runs low at home 100s/60s- 70s. She reports mild dizziness upon waking in the morning.  This resolves as the day progresses and with eating and drinking. Just completed chemotherapy for pancreatic cancer and is following with oncology. Otherwise, she denies any concerns.  Home Medications    Current Outpatient Medications  Medication Sig Dispense Refill   ALPRAZolam (XANAX) 0.5 MG tablet TAKE 1 TABLET(0.5 MG) BY MOUTH TWICE DAILY AS NEEDED FOR ANXIETY (Patient taking differently: Take 0.25 mg by mouth at bedtime as needed. TAKE 1 TABLET(0.5 MG) BY MOUTH TWICE DAILY AS NEEDED FOR ANXIETY) 60 tablet 2   HYDROcodone-acetaminophen (NORCO) 7.5-325 MG tablet Take 1 tablet by mouth every 8 (eight) hours as needed for severe pain. 60 tablet 0   ibuprofen (ADVIL) 200 MG tablet Take 600 mg by mouth every 6 (six) hours as needed for mild pain or moderate pain.     rosuvastatin (CRESTOR) 10 MG tablet Take 1 tablet (10 mg total) by mouth daily. 30 tablet 0   No current facility-administered medications for this visit.      Review of Systems   She denies chest pain, dyspnea, pnd, orthopnea, n, v, syncope, edema, weight gain, or early satiety. All other systems reviewed and are otherwise negative except as noted above.   Physical Exam    VS:  BP 104/62   Pulse 71   Ht 5\' 2"  (1.575 m)   Wt 169 lb (76.7 kg)   LMP  (LMP Unknown)   BMI 30.91 kg/m   GEN: Well nourished, well developed, in no acute distress. HEENT: normal. Neck: Supple, no JVD, carotid bruits, or masses. Cardiac: RRR, no murmurs, rubs, or gallops. No clubbing, cyanosis, edema.  Radials/DP/PT 2+ and equal bilaterally.  Respiratory:  Respirations regular and unlabored, clear to auscultation bilaterally. GI: Soft, nontender, nondistended, BS + x 4. MS: no deformity or atrophy. Skin: warm and dry, no rash. Neuro:  Strength and sensation are intact. Psych: Normal affect.  Accessory Clinical Findings    ECG personally reviewed by me today - NSR c/ PACs, 71 bpm, low voltage, QTC reads 597 ms, corrected to 400 ms - no acute changes.  Lab Results  Component Value Date   WBC 7.8 05/22/2021   HGB 12.8 05/22/2021   HCT 38.8 05/22/2021  MCV 109.9 (H) 05/22/2021   PLT 278 05/22/2021   Lab Results  Component Value Date   CREATININE 0.47 05/22/2021   BUN 14 05/22/2021   NA 139 05/22/2021   K 4.0 05/22/2021   CL 105 05/22/2021   CO2 27 05/22/2021   Lab Results  Component Value Date   ALT 13 05/22/2021   AST 21 05/22/2021   ALKPHOS 64 05/22/2021   BILITOT 0.7 05/22/2021   Lab Results  Component Value Date   CHOL 167 09/17/2017   HDL 53.00 09/17/2017   LDLCALC 96 09/17/2017   TRIG 92.0 09/17/2017   CHOLHDL 3 09/17/2017    Lab Results  Component Value Date   HGBA1C 5.9 (H) 10/17/2020    Assessment & Plan    1. H/o atypical chest pain/Coronary artery calcification on CT: Normal MV in 2016. Coronary CTA 04/2020 showed calcified plaque in the RCA, nonflow limiting, otherwise normal coronaries, reassuring. Stable with no  anginal symptoms. Not on ASA due to history of nose bleeds. Continue Crestor 10 mg daily. Repeat labs as below.    2. Palpitations/PACs/Prolonged QT reading on EKG, corrected: Previously on metoprolol, not on BB with low BP.  She reports rare fleeting palpitations, no associated symptoms. EKG today shows normal sinus rhythm with PACs, low voltage, QTC reads 597 ms, manually corrected to 400 ms.  She is not on any QT prolonging agents.  Will check CMET, Mg to rule any possible electorlyte abnormality. Suspect falsely elevated QTC per EKG machine in the setting of low voltage QRS and T wave flattening, reassuring.   3. HTN/orthostatic hypotension: BP low in office today, 104/62. Not on any meds. She reports mild dizziness upon waking in the morning that improves as the day progresses and with food and drink. Likely orthostatic hypotension. Discussed gradual position changes, compression stockings and need for adequate hydration.  4. HLD c/ LDL goal <70: Most recent LDL 96 in 2019. She is not fasting today. Will check dLDL. If LDL remains elevated above 70, consider increasing Crestor to 20 mg daily. For now, continue Crestor 10 mg daily.  5. Pancreatic cancer/R lung nodule: S/p chemo, pancreatectomy/splenectomy. Chest CT 04/2021 showed stable lung nodule, reduced in size. Followed by oncology.   6. Disposition: CMET, Mg, dLDL in office today. Follow-up in 1 year.   Lenna Sciara, NP 06/25/2021, 11:46 AM

## 2021-06-25 ENCOUNTER — Other Ambulatory Visit: Payer: Self-pay

## 2021-06-25 ENCOUNTER — Encounter (HOSPITAL_BASED_OUTPATIENT_CLINIC_OR_DEPARTMENT_OTHER): Payer: Self-pay | Admitting: Nurse Practitioner

## 2021-06-25 ENCOUNTER — Ambulatory Visit (INDEPENDENT_AMBULATORY_CARE_PROVIDER_SITE_OTHER): Payer: Medicare Other | Admitting: Nurse Practitioner

## 2021-06-25 VITALS — BP 104/62 | HR 71 | Ht 62.0 in | Wt 169.0 lb

## 2021-06-25 DIAGNOSIS — I491 Atrial premature depolarization: Secondary | ICD-10-CM

## 2021-06-25 DIAGNOSIS — R911 Solitary pulmonary nodule: Secondary | ICD-10-CM

## 2021-06-25 DIAGNOSIS — I10 Essential (primary) hypertension: Secondary | ICD-10-CM | POA: Diagnosis not present

## 2021-06-25 DIAGNOSIS — I2510 Atherosclerotic heart disease of native coronary artery without angina pectoris: Secondary | ICD-10-CM

## 2021-06-25 DIAGNOSIS — I951 Orthostatic hypotension: Secondary | ICD-10-CM | POA: Diagnosis not present

## 2021-06-25 DIAGNOSIS — E785 Hyperlipidemia, unspecified: Secondary | ICD-10-CM | POA: Diagnosis not present

## 2021-06-25 DIAGNOSIS — R002 Palpitations: Secondary | ICD-10-CM

## 2021-06-25 NOTE — Patient Instructions (Signed)
Medication Instructions:  Your Physician recommend you continue on your current medication as directed.    *If you need a refill on your cardiac medications before your next appointment, please call your pharmacy*   Lab Work: Your physician recommends that you return for lab work Today- Direct LDL, Magnesium, and CMET   If you have labs (blood work) drawn today and your tests are completely normal, you will receive your results only by: [place] (if you have MyChart) OR A paper copy in the mail If you have any lab test that is abnormal or we need to change your treatment, we will call you to review the results.   Testing/Procedures: None ordered today   Follow-Up: At [facility], you and your health needs are our priority.  As part of our continuing mission to provide you with exceptional heart care, we have created designated Provider Care Teams.  These Care Teams include your primary Cardiologist (physician) and Advanced Practice Providers (APPs -  Physician Assistants and Nurse Practitioners) who all work together to provide you with the care you need, when you need it.  We recommend signing up for the patient portal called "MyChart".  Sign up information is provided on this After Visit Summary.  MyChart is used to connect with patients for Virtual Visits (Telemedicine).  Patients are able to view lab/test results, encounter notes, upcoming appointments, etc.  Non-urgent messages can be sent to your provider as well.   To learn more about what you can do with MyChart, go to [email or web address].    Your next appointment:   1 year(s)  The format for your next appointment:   In Person  Provider:   Candee Furbish, MD     Other Instructions Orthostatic Hypotension Blood pressure is a measurement of how strongly, or weakly, your circulating blood is pressing against the walls of your arteries. Orthostatic hypotension is a drop in blood pressure that can happen when you  change positions, such as when you go from lying down to standing. Arteries are blood vessels that carry blood from your heart throughout your body. When blood pressure is too low, you may not get enough blood to your brain or to the rest of your organs. Orthostatic hypotension can cause light-headedness, sweating, rapid heartbeat, blurred vision, and fainting. These symptoms require further investigation into the cause. What are the causes? Orthostatic hypotension can be caused by many things, including: Sudden changes in posture, such as standing up quickly after you have been sitting or lying down. Loss of blood (anemia) or loss of body fluids (dehydration). Heart problems, neurologic problems, or hormone problems. Pregnancy. Aging. The risk for this condition increases as you get older. Severe infection (sepsis). Certain medicines, such as medicines for high blood pressure or medicines that make the body lose excess fluids (diuretics). What are the signs or symptoms? Symptoms of this condition may include: Weakness, light-headedness, or dizziness. Sweating. Blurred vision. Tiredness (fatigue). Rapid heartbeat. Fainting, in severe cases. How is this diagnosed? This condition is diagnosed based on: Your symptoms and medical history. Your blood pressure measurements. Your health care provider will check your blood pressure when you are: Lying down. Sitting. Standing. A blood pressure reading is recorded as two numbers, such as "120 over 80" (or 120/80). The first ("top") number is called the systolic pressure. It is a measure of the pressure in your arteries as your heart beats. The second ("bottom") number is called the diastolic pressure. It is a measure of the pressure  in your arteries when your heart relaxes between beats. Blood pressure is measured in a unit called mmHg. Healthy blood pressure for most adults is 120/80 mmHg. Orthostatic hypotension is defined as a 20 mmHg drop in  systolic pressure or a 10 mmHg drop in diastolic pressure within 3 minutes of standing. Other information or tests that may be used to diagnose orthostatic hypotension include: Your other vital signs, such as your heart rate and temperature. Blood tests. An electrocardiogram (ECG) or echocardiogram. A Holter monitor. This is a device you wear that records your heart rhythm continuously, usually for 24-48 hours. Tilt table test. For this test, you will be safely secured to a table that moves you from a lying position to an upright position. Your heart rhythm and blood pressure will be monitored during the test. How is this treated? This condition may be treated by: Changing your diet. This may involve eating more salt (sodium) or drinking more water. Changing the dosage of certain medicines you are taking that might be lowering your blood pressure. Correcting the underlying reason for the orthostatic hypotension. Wearing compression stockings. Taking medicines to raise your blood pressure. Avoiding actions that trigger symptoms. Follow these instructions at home: Medicines Take over-the-counter and prescription medicines only as told by your health care provider. Follow instructions from your health care provider about changing the dosage of your current medicines, if this applies. Do not stop or adjust any of your medicines on your own. Eating and drinking  Drink enough fluid to keep your urine pale yellow. Eat extra salt only as directed. Do not add extra salt to your diet unless advised by your health care provider. Eat frequent, small meals. Avoid standing up suddenly after eating. General instructions  Get up slowly from lying down or sitting positions. This gives your blood pressure a chance to adjust. Avoid hot showers and excessive heat as directed by your health care provider. Engage in regular physical activity as directed by your health care provider. If you have compression  stockings, wear them as told. Keep all follow-up visits. This is important. Contact a health care provider if: You have a fever for more than 2-3 days. You feel more thirsty than usual. You feel dizzy or weak. Get help right away if: You have chest pain. You have a fast or irregular heartbeat. You become sweaty or feel light-headed. You feel short of breath. You faint. You have any symptoms of a stroke. "BE FAST" is an easy way to remember the main warning signs of a stroke: B - Balance. Signs are dizziness, sudden trouble walking, or loss of balance. E - Eyes. Signs are trouble seeing or a sudden change in vision. F - Face. Signs are sudden weakness or numbness of the face, or the face or eyelid drooping on one side. A - Arms. Signs are weakness or numbness in an arm. This happens suddenly and usually on one side of the body. S - Speech. Signs are sudden trouble speaking, slurred speech, or trouble understanding what people say. T - Time. Time to call emergency services. Write down what time symptoms started. You have other signs of a stroke, such as: A sudden, severe headache with no known cause. Nausea or vomiting. Seizure. These symptoms may represent a serious problem that is an emergency. Do not wait to see if the symptoms will go away. Get medical help right away. Call your local emergency services (911 in the [place] Do not drive yourself to the hospital. Summary  Orthostatic hypotension is a sudden drop in blood pressure. It can cause light-headedness, sweating, rapid heartbeat, blurred vision, and fainting. Orthostatic hypotension can be diagnosed by having your blood pressure taken while lying down, sitting, and then standing. Treatment may involve changing your diet, wearing compression stockings, sitting up slowly, adjusting your medicines, or correcting the underlying reason for the orthostatic hypotension. Get help right away if you have chest pain, a fast or irregular  heartbeat, or symptoms of a stroke. This information is not intended to replace advice given to you by your health care provider. Make sure you discuss any questions you have with your health care provider. Document Revised: 09/13/2020 Document Reviewed: 09/13/2020 Elsevier Patient Education  [place].

## 2021-06-28 ENCOUNTER — Encounter: Payer: Self-pay | Admitting: Oncology

## 2021-06-28 LAB — COMPREHENSIVE METABOLIC PANEL
ALT: 15 IU/L (ref 0–32)
AST: 22 IU/L (ref 0–40)
Albumin/Globulin Ratio: 2 (ref 1.2–2.2)
Albumin: 4.3 g/dL (ref 3.7–4.7)
Alkaline Phosphatase: 89 IU/L (ref 44–121)
BUN/Creatinine Ratio: 21 (ref 12–28)
BUN: 15 mg/dL (ref 8–27)
Bilirubin Total: 0.4 mg/dL (ref 0.0–1.2)
CO2: 24 mmol/L (ref 20–29)
Calcium: 9.8 mg/dL (ref 8.7–10.3)
Chloride: 103 mmol/L (ref 96–106)
Creatinine, Ser: 0.73 mg/dL (ref 0.57–1.00)
Globulin, Total: 2.2 g/dL (ref 1.5–4.5)
Glucose: 193 mg/dL — ABNORMAL HIGH (ref 70–99)
Potassium: 4.5 mmol/L (ref 3.5–5.2)
Sodium: 141 mmol/L (ref 134–144)
Total Protein: 6.5 g/dL (ref 6.0–8.5)
eGFR: 87 mL/min/{1.73_m2} (ref >59)

## 2021-06-28 LAB — LDL CHOLESTEROL, DIRECT: LDL Direct: 56 mg/dL (ref 0–99)

## 2021-06-28 LAB — MAGNESIUM: Magnesium: 2.2 mg/dL (ref 1.6–2.3)

## 2021-07-01 MED ORDER — ROSUVASTATIN CALCIUM 10 MG PO TABS: 10.0000 mg | ORAL_TABLET | Freq: Every day | ORAL | 3 refills | Status: DC

## 2021-07-01 NOTE — Addendum Note (Signed)
Addended by: Gerald Stabs on: 07/01/2021 09:14 AM   Modules accepted: Orders

## 2021-07-02 ENCOUNTER — Encounter: Payer: Self-pay | Admitting: Internal Medicine

## 2021-07-02 ENCOUNTER — Ambulatory Visit (INDEPENDENT_AMBULATORY_CARE_PROVIDER_SITE_OTHER): Payer: Medicare Other | Admitting: Internal Medicine

## 2021-07-02 ENCOUNTER — Other Ambulatory Visit: Payer: Self-pay

## 2021-07-02 VITALS — BP 120/62 | HR 80 | Temp 98.3°F | Ht 62.0 in | Wt 170.2 lb

## 2021-07-02 DIAGNOSIS — R739 Hyperglycemia, unspecified: Secondary | ICD-10-CM | POA: Diagnosis not present

## 2021-07-02 DIAGNOSIS — F4321 Adjustment disorder with depressed mood: Secondary | ICD-10-CM | POA: Insufficient documentation

## 2021-07-02 DIAGNOSIS — I10 Essential (primary) hypertension: Secondary | ICD-10-CM | POA: Diagnosis not present

## 2021-07-02 DIAGNOSIS — C252 Malignant neoplasm of tail of pancreas: Secondary | ICD-10-CM

## 2021-07-02 DIAGNOSIS — G8929 Other chronic pain: Secondary | ICD-10-CM

## 2021-07-02 DIAGNOSIS — M5440 Lumbago with sciatica, unspecified side: Secondary | ICD-10-CM | POA: Diagnosis not present

## 2021-07-02 DIAGNOSIS — F419 Anxiety disorder, unspecified: Secondary | ICD-10-CM

## 2021-07-02 DIAGNOSIS — F32A Depression, unspecified: Secondary | ICD-10-CM

## 2021-07-02 DIAGNOSIS — Z634 Disappearance and death of family member: Secondary | ICD-10-CM | POA: Diagnosis not present

## 2021-07-02 LAB — POCT GLYCOSYLATED HEMOGLOBIN (HGB A1C)
HbA1c POC (<> result, manual entry): 6.6 % (ref 4.0–5.6)
HbA1c, POC (controlled diabetic range): 6.6 % (ref 0.0–7.0)
HbA1c, POC (prediabetic range): 6.6 % — AB (ref 5.7–6.4)
Hemoglobin A1C: 6.6 % — AB (ref 4.0–5.6)

## 2021-07-02 MED ORDER — HYDROCODONE-ACETAMINOPHEN 7.5-325 MG PO TABS: 1.0000 | ORAL_TABLET | Freq: Three times a day (TID) | ORAL | 0 refills | Status: DC | PRN

## 2021-07-02 NOTE — Assessment & Plan Note (Signed)
Xanax prn  Potential benefits of a long term benzodiazepines  use as well as potential risks  and complications were explained to the patient and were aknowledged. 

## 2021-07-02 NOTE — Progress Notes (Signed)
Subjective:  Patient ID: Amanda Arnold, female    DOB: 1946-06-23  Age: 75 y.o. MRN: [id number]  CC: Follow-up (2 MONTH F/U)   HPI LANDRA HOWZE presents for cancer, anxiety, LBP f/u Son died in 14-Mar-2021 - ?suicide  Outpatient Medications Prior to Visit  Medication Sig Dispense Refill   ALPRAZolam (XANAX) 0.5 MG tablet TAKE 1 TABLET(0.5 MG) BY MOUTH TWICE DAILY AS NEEDED FOR ANXIETY (Patient taking differently: Take 0.25 mg by mouth at bedtime as needed. TAKE 1 TABLET(0.5 MG) BY MOUTH TWICE DAILY AS NEEDED FOR ANXIETY) 60 tablet 2   ibuprofen (ADVIL) 200 MG tablet Take 600 mg by mouth every 6 (six) hours as needed for mild pain or moderate pain.     rosuvastatin (CRESTOR) 10 MG tablet Take 1 tablet (10 mg total) by mouth daily. 90 tablet 3   HYDROcodone-acetaminophen (NORCO) 7.5-325 MG tablet Take 1 tablet by mouth every 8 (eight) hours as needed for severe pain. 60 tablet 0   No facility-administered medications prior to visit.    ROS: Review of Systems  Constitutional:  Negative for activity change, appetite change, chills, fatigue and unexpected weight change.  HENT:  Negative for congestion, mouth sores and sinus pressure.   Eyes:  Negative for visual disturbance.  Respiratory:  Negative for cough and chest tightness.   Gastrointestinal:  Negative for abdominal pain and nausea.  Genitourinary:  Negative for difficulty urinating, frequency and vaginal pain.  Musculoskeletal:  Positive for back pain. Negative for gait problem.  Skin:  Negative for pallor and rash.  Neurological:  Negative for dizziness, tremors, weakness, numbness and headaches.  Psychiatric/Behavioral:  Positive for dysphoric mood. Negative for confusion, sleep disturbance and suicidal ideas. The patient is nervous/anxious.    Objective:  BP 120/62 (BP Location: Left Arm)    Pulse 80    Temp 98.3 F (36.8 C) (Oral)    Ht 5\' 2"  (1.575 m)    Wt 170 lb 3.2 oz (77.2 kg)    LMP  (LMP Unknown)    SpO2 94%     BMI 31.13 kg/m   BP Readings from Last 3 Encounters:  07/02/21 120/62  06/25/21 104/62  06/13/21 126/61    Wt Readings from Last 3 Encounters:  07/02/21 170 lb 3.2 oz (77.2 kg)  06/25/21 169 lb (76.7 kg)  06/13/21 167 lb (75.8 kg)    Physical Exam Constitutional:      General: She is not in acute distress.    Appearance: She is well-developed.  HENT:     Head: Normocephalic.     Right Ear: External ear normal.     Left Ear: External ear normal.     Nose: Nose normal.  Eyes:     General:        Right eye: No discharge.        Left eye: No discharge.     Conjunctiva/sclera: Conjunctivae normal.     Pupils: Pupils are equal, round, and reactive to light.  Neck:     Thyroid: No thyromegaly.     Vascular: No JVD.     Trachea: No tracheal deviation.  Cardiovascular:     Rate and Rhythm: Normal rate and regular rhythm.     Heart sounds: Normal heart sounds.  Pulmonary:     Effort: No respiratory distress.     Breath sounds: No stridor. No wheezing.  Abdominal:     General: Bowel sounds are normal. There is no distension.  Palpations: Abdomen is soft. There is no mass.     Tenderness: There is no abdominal tenderness. There is no guarding or rebound.  Musculoskeletal:        General: Tenderness present.     Cervical back: Normal range of motion and neck supple. No rigidity.  Lymphadenopathy:     Cervical: No cervical adenopathy.  Skin:    Findings: No erythema or rash.  Neurological:     Cranial Nerves: No cranial nerve deficit.     Motor: No abnormal muscle tone.     Coordination: Coordination normal.     Deep Tendon Reflexes: Reflexes normal.  Psychiatric:        Behavior: Behavior normal.        Thought Content: Thought content normal.        Judgment: Judgment normal.  LS tender w/ROM  Lab Results  Component Value Date   WBC 7.8 05/22/2021   HGB 12.8 05/22/2021   HCT 38.8 05/22/2021   PLT 278 05/22/2021   GLUCOSE 193 (H) 06/25/2021   CHOL 167  09/17/2017   TRIG 92.0 09/17/2017   HDL 53.00 09/17/2017   LDLDIRECT 56 06/25/2021   LDLCALC 96 09/17/2017   ALT 15 06/25/2021   AST 22 06/25/2021   NA 141 06/25/2021   K 4.5 06/25/2021   CL 103 06/25/2021   CREATININE 0.73 06/25/2021   BUN 15 06/25/2021   CO2 24 06/25/2021   TSH 1.24 02/16/2020   INR 1.1 (H) 08/22/2020   HGBA1C 5.9 (H) 10/17/2020    No results found.  Assessment & Plan:   Problem List Items Addressed This Visit     Anxiety and depression    Xanax prn  Potential benefits of a long term benzodiazepines  use as well as potential risks  and complications were explained to the patient and were aknowledged.      Cancer of pancreas, tail ([place])    S/p robotic distal pancreatectomy - Dr Barry Dienes. 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       Essential hypertension    Hydrate well Off BP meds      Grief at loss of child    Son died in 03-21-2021 - ?suicide Discussed      Hyperglycemia - Primary    Worse Glu was elevated;   POC A1c - 6.6% - improve diet      LOW BACK PAIN    Norco prn  Potential benefits of a long term opioids use as well as potential risks (i.e. addiction risk, apnea etc) and complications (i.e. Somnolence, constipation and others) were explained to the patient and were aknowledged.      Relevant Medications   HYDROcodone-acetaminophen (NORCO) 7.5-325 MG tablet      Meds ordered this encounter  Medications   HYDROcodone-acetaminophen (NORCO) 7.5-325 MG tablet    Sig: Take 1 tablet by mouth every 8 (eight) hours as needed for severe pain.    Dispense:  60 tablet    Refill:  0    M54.30      Follow-up: Return in about 3 months (around 09/30/2021) for a follow-up visit.  Walker Kehr, MD

## 2021-07-02 NOTE — Assessment & Plan Note (Signed)
Hydrate well Off BP meds

## 2021-07-02 NOTE — Assessment & Plan Note (Signed)
S/p robotic distal pancreatectomy - Dr Barry Dienes. 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

## 2021-07-02 NOTE — Assessment & Plan Note (Signed)
Son died in August 2022 - ?suicide Discussed

## 2021-07-02 NOTE — Assessment & Plan Note (Signed)
Norco prn  Potential benefits of a long term opioids use as well as potential risks (i.e. addiction risk, apnea etc) and complications (i.e. Somnolence, constipation and others) were explained to the patient and were aknowledged. 

## 2021-07-02 NOTE — Assessment & Plan Note (Addendum)
Worse Glu was elevated;   POC A1c - 6.6% - improve diet

## 2021-07-16 ENCOUNTER — Other Ambulatory Visit (HOSPITAL_COMMUNITY): Payer: Self-pay

## 2021-07-17 ENCOUNTER — Telehealth: Payer: Self-pay | Admitting: *Deleted

## 2021-07-17 NOTE — Telephone Encounter (Signed)
Received letter from [facility] that she has not used funds since September, so her funds will be terminated. Confirmed w/daughter this is accurate since she is no longer on Xeloda.

## 2021-07-18 ENCOUNTER — Other Ambulatory Visit: Payer: Self-pay | Admitting: General Surgery

## 2021-07-18 DIAGNOSIS — R109 Unspecified abdominal pain: Secondary | ICD-10-CM

## 2021-07-18 DIAGNOSIS — C252 Malignant neoplasm of tail of pancreas: Secondary | ICD-10-CM

## 2021-07-26 ENCOUNTER — Ambulatory Visit
Admission: RE | Admit: 2021-07-26 | Discharge: 2021-07-26 | Disposition: A | Discharge status: Home / Self Care | Payer: Medicare Other | Source: Ambulatory Visit | Attending: General Surgery | Admitting: General Surgery

## 2021-07-26 DIAGNOSIS — R109 Unspecified abdominal pain: Secondary | ICD-10-CM

## 2021-07-26 DIAGNOSIS — N200 Calculus of kidney: Secondary | ICD-10-CM | POA: Diagnosis not present

## 2021-07-26 DIAGNOSIS — K5730 Diverticulosis of large intestine without perforation or abscess without bleeding: Secondary | ICD-10-CM | POA: Diagnosis not present

## 2021-07-26 DIAGNOSIS — I10 Essential (primary) hypertension: Secondary | ICD-10-CM | POA: Diagnosis not present

## 2021-07-26 DIAGNOSIS — Z8507 Personal history of malignant neoplasm of pancreas: Secondary | ICD-10-CM | POA: Diagnosis not present

## 2021-07-26 DIAGNOSIS — C252 Malignant neoplasm of tail of pancreas: Secondary | ICD-10-CM

## 2021-07-26 MED ORDER — IOPAMIDOL (ISOVUE-300) INJECTION 61%
100.0000 mL | Freq: Once | INTRAVENOUS | Status: AC | PRN
  Administered 2021-07-26: 100 mL via INTRAVENOUS

## 2021-07-30 DIAGNOSIS — L02233 Carbuncle of chest wall: Secondary | ICD-10-CM | POA: Diagnosis not present

## 2021-07-30 DIAGNOSIS — L080 Pyoderma: Secondary | ICD-10-CM | POA: Diagnosis not present

## 2021-07-30 DIAGNOSIS — L02223 Furuncle of chest wall: Secondary | ICD-10-CM | POA: Diagnosis not present

## 2021-08-07 ENCOUNTER — Telehealth: Payer: Self-pay

## 2021-08-07 NOTE — Progress Notes (Signed)
° ° °  Chronic Care Management Pharmacy Assistant   Name: Amanda Arnold  MRN: [id number] DOB: 11-Feb-1946   Reason for Encounter: Disease State-General Call    Recent office visits:  07/02/21 Plotnikov, Amanda Lacks, MD-PCP (Hyperglycemia) Labs: Hb A1C, no med changes  04/29/21 Plotnikov, Amanda Lacks, MD-PCP (Hypotension) no orders or med changes  Recent consult visits:  06/25/21 Amanda Stabs, RN-Cardiology (Coronary artery calcification) no orders or med changes  05/22/21 Amanda Pier, MD-Cardiology (Port-A-Cath in place) referral to General Surgery, no med changes  Hospital visits:  Medication Reconciliation was completed by comparing discharge summary, [name] and Pharmacy list, and upon discussion with patient.  Admitted to the hospital on 06/13/21  due to port removal. Discharge date was 06/13/21. Discharged from [facility].    Medications that remain the same after Hospital Discharge:??  -All other medications will remain the same.    Medications: Outpatient Encounter Medications as of 08/07/2021  Medication Sig   ALPRAZolam (XANAX) 0.5 MG tablet TAKE 1 TABLET(0.5 MG) BY MOUTH TWICE DAILY AS NEEDED FOR ANXIETY (Patient taking differently: Take 0.25 mg by mouth at bedtime as needed. TAKE 1 TABLET(0.5 MG) BY MOUTH TWICE DAILY AS NEEDED FOR ANXIETY)   HYDROcodone-acetaminophen (NORCO) 7.5-325 MG tablet Take 1 tablet by mouth every 8 (eight) hours as needed for severe pain.   ibuprofen (ADVIL) 200 MG tablet Take 600 mg by mouth every 6 (six) hours as needed for mild pain or moderate pain.   rosuvastatin (CRESTOR) 10 MG tablet Take 1 tablet (10 mg total) by mouth daily.   No facility-administered encounter medications on file as of 08/07/2021.   Have you had any problems recently with your health?Patient states that she is not having any new health issues at this time  Have you had any problems with your pharmacy?Patient states that she is not having any  problems with getting medications or the cost of medications from the pharmacy  What issues or side effects are you having with your medications?Patient states not side effects to medications  What would you like me to pass along to [facility] for them to help you with? Patient states that she is doing well  What can we do to take care of you better? Patient states that she does not need anything at this time  Care Gaps: Colonoscopy-08/28/14 Diabetic Foot Exam-10/28/18 Mammogram-09/18/17 Ophthalmology-10/23/19 Dexa Scan - NA Annual Well Visit - 12/12/20 Micro albumin-NA Hemoglobin A1c- 07/02/21  Star Rating Drugs: Rosuvastatin 10 mg-last fill 07/01/21 90 ds  Amanda Arnold Clinical Pharmacist Assistant [phone]

## 2021-08-15 ENCOUNTER — Ambulatory Visit: Payer: Medicare Other | Admitting: Internal Medicine

## 2021-08-15 ENCOUNTER — Encounter: Payer: Self-pay | Admitting: Internal Medicine

## 2021-08-15 VITALS — BP 128/78 | HR 83 | Ht 62.0 in | Wt 169.0 lb

## 2021-08-15 DIAGNOSIS — K219 Gastro-esophageal reflux disease without esophagitis: Secondary | ICD-10-CM | POA: Diagnosis not present

## 2021-08-15 DIAGNOSIS — R109 Unspecified abdominal pain: Secondary | ICD-10-CM | POA: Diagnosis not present

## 2021-08-15 DIAGNOSIS — Z860100 Personal history of colon polyps, unspecified: Secondary | ICD-10-CM

## 2021-08-15 DIAGNOSIS — C252 Malignant neoplasm of tail of pancreas: Secondary | ICD-10-CM

## 2021-08-15 NOTE — Patient Instructions (Signed)
If you are age 74 or older, your body mass index should be between 23-30. Your Body mass index is 30.91 kg/m. If this is out of the aforementioned range listed, please consider follow up with your Primary Care Provider.  If you are age 57 or younger, your body mass index should be between 19-25. Your Body mass index is 30.91 kg/m. If this is out of the aformentioned range listed, please consider follow up with your Primary Care Provider.   ________________________________________________________  The [facility] providers would like to encourage you to use [facility] to communicate with providers for non-urgent requests or questions.  Due to long hold times on the telephone, sending your provider a message by [facility] may be a faster and more efficient way to get a response.  Please allow 48 business hours for a response.  Please remember that this is for non-urgent requests.  _______________________________________________________  Amanda Arnold may take Prilosec over the counter.  I will put in a recall for a colonoscopy for 08/2022.

## 2021-08-15 NOTE — Progress Notes (Signed)
HISTORY OF PRESENT ILLNESS:  Amanda Arnold is a 76 y.o. female with multiple medical problems as listed below who I saw on 1 occasion August 28, 2014 regarding screening colonoscopy.  She was found to have 2 diminutive colon polyps and sigmoid diverticulosis.  Follow-up in 5 years recommended.  The patient has not been seen since until July 24, 2020 when she was scheduled with the GI physician assistant regarding a mass in the tail of the pancreas which was found on CT being done for evaluation of right-sided back/groin/leg pain.  The work-up and treatment thereafter as follows: Pancreas cancer-stage IIb (pT2pN1) CT abdomen/pelvis 06/01/2020-2.9 x 3 cm pancreas tail mass, slight increase in size of 7 mm right middle lobe nodule MRI/MRCP 06/22/2020-solid 3.8 x 3 x 3.1 cm pancreas tail mass with focal occlusion of the splenic vein and dilated venous collaterals, no evidence of metastatic disease, bilateral adrenal adenomas CT renal stone study 08/02/2020-scattered airspace opacities throughout the lung bases-new favored to represent infectious/inflammatory changes EUS 08/16/2020-pancreas tail mass, T2N0, FNA biopsy-suspicious for malignancy CTs 08/27/2020-unchanged right middle lobe nodule, superficial left upper back lesion, no change in pancreas tail mass, occluded splenic vein with collaterals at the splenic hilum, no enlarged abdominal pelvic lymph nodes Distal pancreatectomy/splenectomy 10/17/2020- 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  2/15 lymph nodes positive, positive lymphovascular and perineural invasion, resection margins negative, pT2pN1 Cycle 1 gemcitabine/capecitabine 11/21/2020, day 15 gemcitabine 12/05/2020 Cycle 2 gemcitabine/capecitabine 12/20/2020, day 15 gemcitabine 01/03/2021 Cycle 3 gemcitabine/capecitabine 01/16/2021, day 15 gemcitabine 01/30/2021 Cycle 4 gemcitabine/capecitabine 02/13/2021,  day 15 gemcitabine 02/27/2021 Cycle 5 gemcitabine/capecitabine 03/13/2021 (capecitabine dose reduced secondary to hand/foot syndrome), day 15 gemcitabine 03/27/2021 Cycle 6 gemcitabine/capecitabine 04/10/2021, day 15 gemcitabine 04/24/2021   She was also noted to have a gastric ulcer at the time of EUS which was H. pylori positive.  She tells me that she has received therapy.  She had been on PPI.  She does have occasional GERD symptoms.  No longer on PPI.  She was seen in follow-up by general surgery, Dr. Barry Dienes, July 12, 2021.  Patient tells me at that time that she mentioned right lower quadrant pain for few days duration.  Patient tells me that the pain was mostly in her right hip, similar to pain that she has had in the past.  It was recommended that she make an appointment with gastroenterology.  Subsequent CT scan performed July 26, 2021 revealed evidence of prior distal pancreatectomy and splenectomy.  Slight increase in the size of a cystic lesion at the distal pancreatic tail/stump.  And other incidental findings as noted.  Patient tells me that her pain resolved shortly after her surgical visit without recurrence.  Her GI review of systems is entirely negative except for occasional reflux with dietary indiscretion.  She does have mild constipation which she treats nicely with prune juice.    REVIEW OF SYSTEMS:  All non-GI ROS negative unless otherwise stated in the HPI. Past Medical History:  Diagnosis Date   Abdominal pain, epigastric 12/18/2009   ANXIETY 05/25/2006   BRONCHITIS, ACUTE 24/03/7352   Complication of anesthesia     difficulty waking up x 1 - "years ago"   Constipation    COPD 05/25/2006   DEPRESSION 01/02/2006   ESOTROPIA, LEFT EYE October 12, 1947   Family history of breast cancer 11/26/2020   FIBROMYALGIA 04/02/2006   GERD (gastroesophageal reflux disease)    Headache(784.0) 12/08/2007   History of blood transfusion  HYPERLIPIDEMIA 04/30/2006   HYPERTENSION  05/25/2006   Hypertension    INSOMNIA, PERSISTENT 02/22/2008   Lazy eye    left - no surgery   LOW BACK PAIN 01/27/2007   Migraines    NEURALGIA, 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/2009   Neuropathy    OBSTRUCTIVE SLEEP APNEA 04/17/2010   does not use CPAP   OSTEOARTHRITIS 05/25/2006   pancreatic ca dx'd 06/2020   Polyuria    Pre-diabetes    diet controlled   Psychosomatic disease 2011   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   T M J 05/11/2009    Past Surgical History:  Procedure Laterality Date   BIOPSY  08/16/2020   Procedure: BIOPSY;  Surgeon: Irving Copas., MD;  Location: [facility] ENDOSCOPY;  Service: Endoscopy;;   CHOLECYSTECTOMY     COLONOSCOPY     ESOPHAGOGASTRODUODENOSCOPY (EGD) WITH PROPOFOL N/A 08/16/2020   Procedure: ESOPHAGOGASTRODUODENOSCOPY (EGD) WITH PROPOFOL;  Surgeon: Irving Copas., MD;  [facility] ENDOSCOPY;  Service: Endoscopy;  Laterality: N/A;   EUS N/A 08/16/2020   Procedure: UPPER ENDOSCOPIC ULTRASOUND (EUS) RADIAL;  Surgeon: Rush Landmark Telford Nab., MD;  Location: [place];  Service: Endoscopy;  Laterality: N/A;   EYE SURGERY Bilateral    cataract with lens implant   FINE NEEDLE ASPIRATION  08/16/2020   Procedure: FINE NEEDLE ASPIRATION (FNA) LINEAR;  Surgeon: Irving Copas., MD;  Location: [place];  Service: Endoscopy;;   MASS EXCISION Left 10/06/2016   Procedure: EXCISION LEFT BUTTOCKS MASS;  Surgeon: Alphonsa Overall, MD;  Location: [place];  Service: General;  Laterality: Left;   PORT-A-CATH REMOVAL N/A 06/13/2021   Procedure: PORT REMOVAL;  Surgeon: [name], MD;  Location: [place] [facility];  Service: General;  Laterality: N/A;   PORTACATH PLACEMENT N/A 11/14/2020   Procedure: INSERTION PORT-A-CATH;  Surgeon: [name], MD;  Location: [place];  Service: General;  Laterality: N/A;   XI ROBOTIC ASSISTED LAPAROSCOPIC DISTAL PANCREATECTOMY N/A 10/17/2020   Procedure: XI ROBOTIC ASSISTED DISTAL PANCREATECTOMY AND SPLENECTOMY;  Surgeon: [name], MD;  Location: [place];  Service: General;  Laterality: N/A;    Social History Amanda Arnold  reports that she quit smoking about 8 years ago. Her smoking use included cigarettes. Her smokeless tobacco use includes snuff. She reports that she does not drink alcohol and does not use drugs.  family history includes Breast cancer in her maternal aunt; Cancer in her maternal uncle; Cervical cancer in her mother; Heart attack in her father; Heart disease in her mother; Hypertension in her brother; Leukemia in her brother; Skin cancer in her daughter and mother.  Allergies  Allergen Reactions   Morphine And Related Shortness Of Breath   Bupropion Hcl Other (See Comments)    "does not feel right"   Ezetimibe Other (See Comments)    REACTION: legs burning   Metformin And Related Diarrhea    Diarrhea w/XR or regular    Naproxen     Upset stomach, diarrhea   Trazodone Hcl Swelling   Tramadol Hcl Palpitations    REACTION: palpitations       PHYSICAL EXAMINATION: Vital signs: BP 128/78    Pulse 83    Ht 5' 2"  (1.575 m)    Wt 169 lb (76.7 kg)    LMP  (LMP Unknown)    SpO2 97%    BMI 30.91 kg/m   Constitutional: generally well-appearing, no acute distress Psychiatric: alert and oriented x3, cooperative Eyes: extraocular movements intact, anicteric, conjunctiva pink Mouth: oral pharynx moist, no lesions Neck:  supple no lymphadenopathy Cardiovascular: heart regular rate and rhythm, no murmur Lungs: clear to auscultation bilaterally Abdomen: soft, nontender, nondistended, no obvious ascites, no peritoneal signs, normal bowel sounds, no organomegaly.  Surgical incisions well-healed Rectal: Omitted Extremities: no clubbing, cyanosis, or lower extremity edema bilaterally Skin: no lesions on visible extremities Neuro: No focal deficits.  Cranial nerves intact  ASSESSMENT:  1.  Transient right lower quadrant/mostly hip pain.  Musculoskeletal.  Discussed.  Reassurance provided. 2.   Pancreatic cancer of the tail status postresection followed by adjuvant chemotherapy. 3.  Recent CT abnormalities in the region of prior surgery, as noted.  To be followed up by surgery/oncology. 4.  History of diminutive adenomatous polyps 2016 5.  GERD. 6.  History of H. pylori associated gastric ulcer 7.  Multiple medical problems.  Stable  PLAN:  1.  Reflux precautions 2.  Recommend Prilosec OTC 20 mg daily for reflux. 3.  We discussed surveillance colonoscopy.  Most recent guidelines reviewed.  She wishes to convalesce a bit more from her dealings with cancer 4.  We will place colonoscopy recall for 1 year.  If she is doing well clinically, we will proceed.  She agrees. 5.  Interval follow-up as needed A total time of 40 minutes was spent preparing to see the patient, reviewing myriad of tests, x-rays, and pathology.  Obtain comprehensive history, performing medically appropriate physical exam, counseling the patient regarding the above listed issues, ordering medication (Prilosec OTC), discussing follow-up plans, and documenting clinical information in the health record

## 2021-08-22 ENCOUNTER — Other Ambulatory Visit: Payer: Self-pay

## 2021-08-22 ENCOUNTER — Inpatient Hospital Stay: Payer: Medicare Other | Attending: Oncology | Admitting: Nurse Practitioner

## 2021-08-22 ENCOUNTER — Inpatient Hospital Stay: Payer: Medicare Other

## 2021-08-22 ENCOUNTER — Encounter: Payer: Self-pay | Admitting: Nurse Practitioner

## 2021-08-22 VITALS — BP 130/77 | HR 75 | Temp 98.7°F | Resp 19 | Ht 62.0 in | Wt 168.0 lb

## 2021-08-22 DIAGNOSIS — Z87891 Personal history of nicotine dependence: Secondary | ICD-10-CM | POA: Insufficient documentation

## 2021-08-22 DIAGNOSIS — Z8507 Personal history of malignant neoplasm of pancreas: Secondary | ICD-10-CM | POA: Diagnosis not present

## 2021-08-22 DIAGNOSIS — C252 Malignant neoplasm of tail of pancreas: Secondary | ICD-10-CM | POA: Diagnosis not present

## 2021-08-22 DIAGNOSIS — R21 Rash and other nonspecific skin eruption: Secondary | ICD-10-CM | POA: Insufficient documentation

## 2021-08-22 DIAGNOSIS — J449 Chronic obstructive pulmonary disease, unspecified: Secondary | ICD-10-CM | POA: Insufficient documentation

## 2021-08-22 DIAGNOSIS — I10 Essential (primary) hypertension: Secondary | ICD-10-CM | POA: Diagnosis not present

## 2021-08-22 NOTE — Progress Notes (Signed)
[place] OFFICE PROGRESS NOTE   Diagnosis: Pancreas cancer  INTERVAL HISTORY:   [name] returns as scheduled.  Overall she is feeling well.  She is no longer having crampy abdominal pain.  No nausea or vomiting.  She reports a good appetite.  She reports a rash in the left axilla.  Objective:  Vital signs in last 24 hours:  Blood pressure 130/77, pulse 75, temperature 98.7 F (37.1 C), temperature source Oral, resp. rate 19, height 5\' 2"  (1.575 m), weight 168 lb (76.2 kg), SpO2 98 %.    Lymphatics: No palpable cervical, supraclavicular, axillary or inguinal lymph nodes. Resp: Lungs clear bilaterally. Cardio: Regular rate and rhythm. GI: Abdomen soft and nontender.  No hepatomegaly.  No mass. Vascular: No leg edema. Skin: Well-demarcated erythematous rash left axilla.   Lab Results:  Lab Results  Component Value Date   WBC 7.8 05/22/2021   HGB 12.8 05/22/2021   HCT 38.8 05/22/2021   MCV 109.9 (H) 05/22/2021   PLT 278 05/22/2021   NEUTROABS 4.3 05/22/2021    Imaging:  No results found.  Medications: I have reviewed the patient's current medications.  Assessment/Plan: Pancreas cancer-stage IIb (pT2pN1) CT abdomen/pelvis 06/01/2020-2.9 x 3 cm pancreas tail mass, slight increase in size of 7 mm right middle lobe nodule MRI/MRCP 06/22/2020-solid 3.8 x 3 x 3.1 cm pancreas tail mass with focal occlusion of the splenic vein and dilated venous collaterals, no evidence of metastatic disease, bilateral adrenal adenomas CT renal stone study 08/02/2020-scattered airspace opacities throughout the lung bases-new favored to represent infectious/inflammatory changes EUS 08/16/2020-pancreas tail mass, T2N0, FNA biopsy-suspicious for malignancy CTs 08/27/2020-unchanged right middle lobe nodule, superficial left upper back lesion, no change in pancreas tail mass, occluded splenic vein with collaterals at the splenic hilum, no enlarged abdominal pelvic lymph  nodes Distal pancreatectomy/splenectomy 10/17/2020- 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  2/15 lymph nodes positive, positive lymphovascular and perineural invasion, resection margins negative, pT2pN1 Cycle 1 gemcitabine/capecitabine 11/21/2020, day 15 gemcitabine 12/05/2020 Cycle 2 gemcitabine/capecitabine 12/20/2020, day 15 gemcitabine 01/03/2021 Cycle 3 gemcitabine/capecitabine 01/16/2021, day 15 gemcitabine 01/30/2021 Cycle 4 gemcitabine/capecitabine 02/13/2021, day 15 gemcitabine 02/27/2021 Cycle 5 gemcitabine/capecitabine 03/13/2021 (capecitabine dose reduced secondary to hand/foot syndrome), day 15 gemcitabine 03/27/2021 Cycle 6 gemcitabine/capecitabine 04/10/2021, day 15 gemcitabine 04/24/2021 CT abdomen/pelvis 07/26/2021-slight increase in size of a cystic lesion at the distal pancreatic tail/stump.  Additional fluid collections at the surgical bed decreased in size.   2.   Gastric ulcer on EUS 08/16/2020-H. pylori positive 3.   COVID-19 infection January 2022 4.   COPD 5.   Hypertension 6.   Right abdomen/right leg pain-musculoskeletal? 7.   Right middle lobe nodule on CT 06/01/2020, increased from 2017, stable on CT 11/13/2020, stable on CT 04/23/2021 8.   History of tobacco use 9.   Superficial left upper back lesion noted on CT 08/27/2020      Disposition: Amanda Arnold is in clinical remission from pancreas cancer.  We will obtain a CA 19-9 today.  She had a CT scan last month to evaluate abdominal pain.  There was slight increase in the size of a cystic lesion at the distal pancreatic tail/stump, additional fluid collections at the surgical bed decreased in size.  Dr. Barry Dienes is planning follow-up CTs at a 2 to 48-month interval.  She will follow-up with dermatology regarding the left axillary rash.  Ms. Takacs will return for follow-up here in 3 to 4 months.  She will contact the office in the interim with  any problems.    Ned Card ANP/GNP-BC   08/22/2021  8:57 AM

## 2021-08-23 ENCOUNTER — Telehealth: Payer: Self-pay

## 2021-08-23 DIAGNOSIS — H26493 Other secondary cataract, bilateral: Secondary | ICD-10-CM | POA: Diagnosis not present

## 2021-08-23 DIAGNOSIS — H52203 Unspecified astigmatism, bilateral: Secondary | ICD-10-CM | POA: Diagnosis not present

## 2021-08-23 DIAGNOSIS — E119 Type 2 diabetes mellitus without complications: Secondary | ICD-10-CM | POA: Diagnosis not present

## 2021-08-23 DIAGNOSIS — H43813 Vitreous degeneration, bilateral: Secondary | ICD-10-CM | POA: Diagnosis not present

## 2021-08-23 LAB — CANCER ANTIGEN 19-9: CA 19-9: 14 U/mL (ref 0–35)

## 2021-08-23 LAB — HM DIABETES EYE EXAM

## 2021-08-23 NOTE — Telephone Encounter (Signed)
-----   Message from Owens Shark, NP sent at 08/23/2021  8:16 AM EST ----- Please let her know the CA 19-9 tumor marker is stable in normal range.  Follow-up as scheduled.

## 2021-08-23 NOTE — Telephone Encounter (Signed)
Called and spoke with the patient to let her know the CA 19-9 tumor marker is stable in normal. Patient voiced understanding of instruction and had no further questions or concerns at this time.

## 2021-09-02 ENCOUNTER — Telehealth: Payer: Self-pay

## 2021-09-02 NOTE — Chronic Care Management (AMB) (Signed)
° ° °  Chronic Care Management Pharmacy Assistant   Name: Amanda Arnold  MRN: [id number] DOB: [birth year]  Amanda Arnold is an 76 y.o. year old female who presents for his follow-up CCM visit with the clinical pharmacist.  Reason for Encounter: Disease State-General   Recent office visits:  None ID  Recent consult visits:  08/22/21 Owens Shark, NP-Hematology and Oncology (Cancer of pancreas) Orders: CA 19.9, no med changes  08/15/21 Irene Shipper, MD-Gastroenterology (Abdominal pain) No orders or med changes  Hospital visits:  None since last coordination call  Medications: Outpatient Encounter Medications as of 09/02/2021  Medication Sig   ALPRAZolam (XANAX) 0.5 MG tablet TAKE 1 TABLET(0.5 MG) BY MOUTH TWICE DAILY AS NEEDED FOR ANXIETY (Patient taking differently: Take 0.25 mg by mouth at bedtime as needed. TAKE 1 TABLET(0.5 MG) BY MOUTH TWICE DAILY AS NEEDED FOR ANXIETY)   HYDROcodone-acetaminophen (NORCO) 7.5-325 MG tablet Take 1 tablet by mouth every 8 (eight) hours as needed for severe pain.   ibuprofen (ADVIL) 200 MG tablet Take 600 mg by mouth every 6 (six) hours as needed for mild pain or moderate pain.   rosuvastatin (CRESTOR) 10 MG tablet Take 1 tablet (10 mg total) by mouth daily.   No facility-administered encounter medications on file as of 09/02/2021.   Have you had any problems recently with your health?Patient states that she is having to use the bathroom urinating  a lot lately  Have you had any problems with your pharmacy?Patient states that she is not having any problems with getting her medications or the cost of medications from the pharmacy  What issues or side effects are you having with your medications?Patient states that she is not having any side effects from medications  What would you like me to pass along to [facility] for them to help you with? Patient states that she has no other concerns about her health or medications at this time  What can  we do to take care of you better? Patient would like to know if she needs an appointment to get a urine test done to see if anything is wrong with her kidneys and to see why she keeps urinating    Care Gaps: Colonoscopy-08/28/14 Diabetic Foot Exam-10/28/18 Mammogram-09/18/17 Ophthalmology-08/23/21 Dexa Scan - NA Annual Well Visit - 12/12/20 Micro albumin-NA Hemoglobin A1c- 07/02/21   Star Rating Drugs: Rosuvastatin 10 mg-last fill 07/01/21 90 ds   Ethelene Hal Clinical Pharmacist Assistant [phone]

## 2021-09-11 DIAGNOSIS — L309 Dermatitis, unspecified: Secondary | ICD-10-CM | POA: Diagnosis not present

## 2021-10-01 ENCOUNTER — Ambulatory Visit: Payer: Medicare Other | Admitting: Internal Medicine

## 2021-10-07 ENCOUNTER — Encounter: Payer: Self-pay | Admitting: Internal Medicine

## 2021-10-07 ENCOUNTER — Other Ambulatory Visit: Payer: Self-pay

## 2021-10-07 ENCOUNTER — Ambulatory Visit (INDEPENDENT_AMBULATORY_CARE_PROVIDER_SITE_OTHER): Payer: Medicare Other | Admitting: Internal Medicine

## 2021-10-07 VITALS — BP 126/68 | HR 72 | Temp 97.7°F | Ht 62.0 in | Wt 169.0 lb

## 2021-10-07 DIAGNOSIS — R1031 Right lower quadrant pain: Secondary | ICD-10-CM

## 2021-10-07 DIAGNOSIS — M5440 Lumbago with sciatica, unspecified side: Secondary | ICD-10-CM | POA: Diagnosis not present

## 2021-10-07 DIAGNOSIS — F419 Anxiety disorder, unspecified: Secondary | ICD-10-CM | POA: Diagnosis not present

## 2021-10-07 DIAGNOSIS — E669 Obesity, unspecified: Secondary | ICD-10-CM

## 2021-10-07 DIAGNOSIS — R202 Paresthesia of skin: Secondary | ICD-10-CM

## 2021-10-07 DIAGNOSIS — L292 Pruritus vulvae: Secondary | ICD-10-CM | POA: Insufficient documentation

## 2021-10-07 DIAGNOSIS — Z6836 Body mass index (BMI) 36.0-36.9, adult: Secondary | ICD-10-CM

## 2021-10-07 DIAGNOSIS — E559 Vitamin D deficiency, unspecified: Secondary | ICD-10-CM

## 2021-10-07 DIAGNOSIS — C252 Malignant neoplasm of tail of pancreas: Secondary | ICD-10-CM | POA: Diagnosis not present

## 2021-10-07 DIAGNOSIS — I2510 Atherosclerotic heart disease of native coronary artery without angina pectoris: Secondary | ICD-10-CM | POA: Diagnosis not present

## 2021-10-07 DIAGNOSIS — R350 Frequency of micturition: Secondary | ICD-10-CM | POA: Diagnosis not present

## 2021-10-07 DIAGNOSIS — F411 Generalized anxiety disorder: Secondary | ICD-10-CM | POA: Diagnosis not present

## 2021-10-07 DIAGNOSIS — I10 Essential (primary) hypertension: Secondary | ICD-10-CM | POA: Diagnosis not present

## 2021-10-07 DIAGNOSIS — G8929 Other chronic pain: Secondary | ICD-10-CM

## 2021-10-07 DIAGNOSIS — E1169 Type 2 diabetes mellitus with other specified complication: Secondary | ICD-10-CM

## 2021-10-07 DIAGNOSIS — F32A Depression, unspecified: Secondary | ICD-10-CM

## 2021-10-07 DIAGNOSIS — I2583 Coronary atherosclerosis due to lipid rich plaque: Secondary | ICD-10-CM

## 2021-10-07 DIAGNOSIS — R399 Unspecified symptoms and signs involving the genitourinary system: Secondary | ICD-10-CM | POA: Insufficient documentation

## 2021-10-07 DIAGNOSIS — E119 Type 2 diabetes mellitus without complications: Secondary | ICD-10-CM

## 2021-10-07 LAB — COMPREHENSIVE METABOLIC PANEL
ALT: 13 U/L (ref 0–35)
AST: 20 U/L (ref 0–37)
Albumin: 4.2 g/dL (ref 3.5–5.2)
Alkaline Phosphatase: 83 U/L (ref 39–117)
BUN: 16 mg/dL (ref 6–23)
CO2: 29 mEq/L (ref 19–32)
Calcium: 9.3 mg/dL (ref 8.4–10.5)
Chloride: 104 mEq/L (ref 96–112)
Creatinine, Ser: 0.66 mg/dL (ref 0.40–1.20)
GFR: 87 mL/min (ref >60.00)
Glucose, Bld: 124 mg/dL — ABNORMAL HIGH (ref 70–99)
Potassium: 3.9 mEq/L (ref 3.5–5.1)
Sodium: 139 mEq/L (ref 135–145)
Total Bilirubin: 0.7 mg/dL (ref 0.2–1.2)
Total Protein: 7 g/dL (ref 6.0–8.3)

## 2021-10-07 LAB — CBC WITH DIFFERENTIAL/PLATELET
Basophils Absolute: 0.1 10*3/uL (ref 0.0–0.1)
Basophils Relative: 0.7 % (ref 0.0–3.0)
Eosinophils Absolute: 0.1 10*3/uL (ref 0.0–0.7)
Eosinophils Relative: 1.9 % (ref 0.0–5.0)
HCT: 41.2 % (ref 36.0–46.0)
Hemoglobin: 13.6 g/dL (ref 12.0–15.0)
Lymphocytes Relative: 40.1 % (ref 12.0–46.0)
Lymphs Abs: 3 10*3/uL (ref 0.7–4.0)
MCHC: 33 g/dL (ref 30.0–36.0)
MCV: 93.5 fl (ref 78.0–100.0)
Monocytes Absolute: 0.7 10*3/uL (ref 0.1–1.0)
Monocytes Relative: 9.4 % (ref 3.0–12.0)
Neutro Abs: 3.6 10*3/uL (ref 1.4–7.7)
Neutrophils Relative %: 47.9 % (ref 43.0–77.0)
Platelets: 290 10*3/uL (ref 150.0–400.0)
RBC: 4.41 Mil/uL (ref 3.87–5.11)
RDW: 13.7 % (ref 11.5–15.5)
WBC: 7.5 10*3/uL (ref 4.0–10.5)

## 2021-10-07 LAB — HEMOGLOBIN A1C: Hgb A1c MFr Bld: 7.1 % — ABNORMAL HIGH (ref 4.6–6.5)

## 2021-10-07 MED ORDER — ALPRAZOLAM 0.5 MG PO TABS: ORAL_TABLET | ORAL | 2 refills | Status: DC

## 2021-10-07 MED ORDER — ALPRAZOLAM 0.5 MG PO TABS: 0.5000 mg | ORAL_TABLET | Freq: Two times a day (BID) | ORAL | 2 refills | Status: DC | PRN

## 2021-10-07 NOTE — Assessment & Plan Note (Signed)
Chronic Cont on Xanax prn  Potential benefits of a long term benzodiazepines  use as well as potential risks  and complications were explained to the patient and were aknowledged. 

## 2021-10-07 NOTE — Assessment & Plan Note (Signed)
Doing well ?BP Readings from Last 3 Encounters:  ?10/07/21 126/68  ?08/22/21 130/77  ?08/15/21 128/78  ? ?NAS diet ?

## 2021-10-07 NOTE — Assessment & Plan Note (Signed)
Norco prn  Potential benefits of a long term opioids use as well as potential risks (i.e. addiction risk, apnea etc) and complications (i.e. Somnolence, constipation and others) were explained to the patient and were aknowledged.  Potential benefits of a long term NSAIDs  use as well as potential risks  and complications were explained to the patient and were aknowledged. 

## 2021-10-07 NOTE — Progress Notes (Signed)
? ?Subjective:  ?Patient ID: Amanda Arnold, female    DOB: Mar 23, 1946  Age: 76 y.o. MRN: [id number] ? ?CC: No chief complaint on file. ? ? ?HPI ?[name] presents for anxiety, LBP, dyslipidemia. ?C/o abnormally looking urine ? ?Outpatient Medications Prior to Visit  ?Medication Sig Dispense Refill  ? ALPRAZolam (XANAX) 0.5 MG tablet TAKE 1 TABLET(0.5 MG) BY MOUTH TWICE DAILY AS NEEDED FOR ANXIETY (Patient taking differently: Take 0.25 mg by mouth at bedtime as needed. TAKE 1 TABLET(0.5 MG) BY MOUTH TWICE DAILY AS NEEDED FOR ANXIETY) 60 tablet 2  ? HYDROcodone-acetaminophen (NORCO) 7.5-325 MG tablet Take 1 tablet by mouth every 8 (eight) hours as needed for severe pain. 60 tablet 0  ? ibuprofen (ADVIL) 200 MG tablet Take 600 mg by mouth every 6 (six) hours as needed for mild pain or moderate pain.    ? rosuvastatin (CRESTOR) 10 MG tablet Take 1 tablet (10 mg total) by mouth daily. 90 tablet 3  ? ?No facility-administered medications prior to visit.  ? ? ?ROS: ?Review of Systems  ?Constitutional:  Negative for activity change, appetite change, chills, fatigue and unexpected weight change.  ?HENT:  Negative for congestion, mouth sores and sinus pressure.   ?Eyes:  Negative for visual disturbance.  ?Respiratory:  Negative for cough and chest tightness.   ?Gastrointestinal:  Negative for abdominal pain and nausea.  ?Genitourinary:  Positive for frequency and urgency. Negative for difficulty urinating and vaginal pain.  ?Musculoskeletal:  Positive for back pain. Negative for gait problem.  ?Skin:  Negative for pallor and rash.  ?Neurological:  Negative for dizziness, tremors, weakness, numbness and headaches.  ?Psychiatric/Behavioral:  Negative for confusion, sleep disturbance and suicidal ideas. The patient is nervous/anxious.   ? ?Objective:  ?BP 126/68 (BP Location: Left Arm, Patient Position: Sitting, Cuff Size: Large)   Pulse 72   Temp 97.7 ?F (36.5 ?C) (Oral)   Ht '5\' 2"'$  (1.575 m)   Wt 169 lb (76.7 kg)    LMP  (LMP Unknown)   SpO2 98%   BMI 30.91 kg/m?  ? ?BP Readings from Last 3 Encounters:  ?10/07/21 126/68  ?08/22/21 130/77  ?08/15/21 128/78  ? ? ?Wt Readings from Last 3 Encounters:  ?10/07/21 169 lb (76.7 kg)  ?08/22/21 168 lb (76.2 kg)  ?08/15/21 169 lb (76.7 kg)  ? ? ?Physical Exam ?Constitutional:   ?   General: She is not in acute distress. ?   Appearance: She is well-developed. She is obese.  ?HENT:  ?   Head: Normocephalic.  ?   Right Ear: External ear normal.  ?   Left Ear: External ear normal.  ?   Nose: Nose normal.  ?Eyes:  ?   General:     ?   Right eye: No discharge.     ?   Left eye: No discharge.  ?   Conjunctiva/sclera: Conjunctivae normal.  ?   Pupils: Pupils are equal, round, and reactive to light.  ?Neck:  ?   Thyroid: No thyromegaly.  ?   Vascular: No JVD.  ?   Trachea: No tracheal deviation.  ?Cardiovascular:  ?   Rate and Rhythm: Normal rate and regular rhythm.  ?   Heart sounds: Normal heart sounds.  ?Pulmonary:  ?   Effort: No respiratory distress.  ?   Breath sounds: No stridor. No wheezing.  ?Abdominal:  ?   General: Bowel sounds are normal. There is no distension.  ?   Palpations: Abdomen is soft. There  is no mass.  ?   Tenderness: There is no abdominal tenderness. There is no guarding or rebound.  ?Musculoskeletal:     ?   General: No tenderness.  ?   Cervical back: Normal range of motion and neck supple. No rigidity.  ?Lymphadenopathy:  ?   Cervical: No cervical adenopathy.  ?Skin: ?   Findings: No erythema or rash.  ?Neurological:  ?   Cranial Nerves: No cranial nerve deficit.  ?   Motor: No abnormal muscle tone.  ?   Coordination: Coordination normal.  ?   Deep Tendon Reflexes: Reflexes normal.  ?Psychiatric:     ?   Behavior: Behavior normal.     ?   Thought Content: Thought content normal.     ?   Judgment: Judgment normal.  ? ? ?Lab Results  ?Component Value Date  ? WBC 7.8 05/22/2021  ? HGB 12.8 05/22/2021  ? HCT 38.8 05/22/2021  ? PLT 278 05/22/2021  ? GLUCOSE 193 (H)  06/25/2021  ? CHOL 167 09/17/2017  ? TRIG 92.0 09/17/2017  ? HDL 53.00 09/17/2017  ? LDLDIRECT 56 06/25/2021  ? Hiram 96 09/17/2017  ? ALT 15 06/25/2021  ? AST 22 06/25/2021  ? NA 141 06/25/2021  ? K 4.5 06/25/2021  ? CL 103 06/25/2021  ? CREATININE 0.73 06/25/2021  ? BUN 15 06/25/2021  ? CO2 24 06/25/2021  ? TSH 1.24 02/16/2020  ? INR 1.1 (H) 08/22/2020  ? HGBA1C 6.6 (A) 07/02/2021  ? HGBA1C 6.6 07/02/2021  ? HGBA1C 6.6 (A) 07/02/2021  ? HGBA1C 6.6 07/02/2021  ? ? ?CT ABDOMEN PELVIS W CONTRAST ? ?Result Date: 07/26/2021 ?CLINICAL DATA:  Abdominal cramping for 1 month, history of cholecystectomy, pancreatic cancer post distal pancreatectomy April 2022, fell hypertension, COPD, fibromyalgia, former smoker EXAM: CT ABDOMEN AND PELVIS WITH CONTRAST TECHNIQUE: Multidetector CT imaging of the abdomen and pelvis was performed using the standard protocol following bolus administration of intravenous contrast. RADIATION DOSE REDUCTION: This exam was performed according to the departmental dose-optimization program which includes automated exposure control, adjustment of the mA and/or kV according to patient size and/or use of iterative reconstruction technique. CONTRAST:  132m ISOVUE-300 IOPAMIDOL (ISOVUE-300) INJECTION 61% IV. Dilute oral contrast. COMPARISON:  11/13/2020 FINDINGS: Lower chest: Lung bases clear Hepatobiliary: Gallbladder surgically absent. Liver normal appearance Pancreas: Prior distal pancreatectomy. Cystic lesion again identified at distal pancreatic tail/stump, 3.5 x 3.1 cm image 21, minimally increased and demonstrating resolution of previously seen surrounding inflammatory changes. Additional focal fluid collection or nodule adjacent to pancreatic tail, 12 x 11 mm, decreased in size, intermediate attenuation, not significantly changed in attenuation on delayed images. Head, body, and proximal tail of pancreas otherwise normal appearance. Spleen: Prior splenectomy. Small residual fluid collection at  splenectomy bed 28 x 13 mm image 18, decreased. Adrenals/Urinary Tract: Thickening of RIGHT adrenal gland without focal mass. Focal mass again identified within LEFT adrenal gland 18 x 14 mm image 23 unchanged. Tiny RIGHT renal cysts. Tiny nonobstructing LEFT renal calculus. No hydronephrosis or ureteral dilatation. Bladder unremarkable. Stomach/Bowel: Normal appendix. Minimal distal colonic diverticulosis without evidence of diverticulitis. Large and small bowel loops otherwise normal. Stomach unremarkable. Vascular/Lymphatic: Atherosclerotic calcifications aorta and iliac arteries without aneurysm. Vascular structures patent. No adenopathy. Reproductive: Calcified uterine leiomyomata. Atrophic uterus and ovaries. Other: No free air or free fluid.  No acute inflammatory process. Musculoskeletal: Osseous demineralization with scattered degenerative disc disease changes of thoracolumbar spine. IMPRESSION: Prior distal pancreatectomy and splenectomy . Slight increase in size of  a cystic lesion at the distal pancreatic tail/stump, though better defined and lacking surrounding inflammatory changes; additional fluid collections at the surgical bed have also decreased in size. Either serial follow-up CT imaging to assess stability of these collections or further characterization by MR imaging recommended to confirm these are postoperative fluid collections and exclude tumor. Stable LEFT adrenal nodule. Minimal distal colonic diverticulosis without evidence of diverticulitis. Tiny nonobstructing LEFT renal calculus. No other intra-abdominal or intrapelvic abnormalities. Aortic Atherosclerosis (ICD10-I70.0). Electronically Signed   By: Lavonia Dana M.D.   On: 07/26/2021 14:41  ? ? ?Assessment & Plan:  ? ?Problem List Items Addressed This Visit   ? ? Obesity  ?  Wt Readings from Last 3 Encounters:  ?10/07/21 169 lb (76.7 kg)  ?08/22/21 168 lb (76.2 kg)  ?08/15/21 169 lb (76.7 kg)  ?stable  ? ?  ?  ? Generalized anxiety  disorder  ?  Chronic ?Cont on PRN Xanax ? Potential benefits of a long term benzodiazepines  use as well as potential risks  and complications were explained to the patient and were aknowledged. ?  ?  ? Anxiety and depr

## 2021-10-07 NOTE — Assessment & Plan Note (Signed)
Wt Readings from Last 3 Encounters:  ?10/07/21 169 lb (76.7 kg)  ?08/22/21 168 lb (76.2 kg)  ?08/15/21 169 lb (76.7 kg)  ?stable  ? ?

## 2021-10-07 NOTE — Assessment & Plan Note (Signed)
Cont on Crestor 

## 2021-10-07 NOTE — Assessment & Plan Note (Signed)
On Crestor for lipids ?On diet ?

## 2021-10-07 NOTE — Assessment & Plan Note (Signed)
Chronic Cont on PRN Xanax  Potential benefits of a long term benzodiazepines  use as well as potential risks  and complications were explained to the patient and were aknowledged. 

## 2021-10-07 NOTE — Assessment & Plan Note (Addendum)
New ?Check UA, glucose ?

## 2021-10-07 NOTE — Assessment & Plan Note (Addendum)
F/u w/Dr Benay Spice and w/Dr Barry Dienes ?Pancreatic cancer stage 2b  (11/21)- s/p chemo ?Check vit D and B12 levels ?

## 2021-10-08 ENCOUNTER — Encounter: Payer: Self-pay | Admitting: Oncology

## 2021-10-08 ENCOUNTER — Other Ambulatory Visit (INDEPENDENT_AMBULATORY_CARE_PROVIDER_SITE_OTHER): Payer: Medicare Other

## 2021-10-08 ENCOUNTER — Other Ambulatory Visit: Payer: Self-pay | Admitting: Internal Medicine

## 2021-10-08 DIAGNOSIS — C252 Malignant neoplasm of tail of pancreas: Secondary | ICD-10-CM | POA: Diagnosis not present

## 2021-10-08 LAB — URINALYSIS
Bilirubin Urine: NEGATIVE
Hgb urine dipstick: NEGATIVE
Ketones, ur: NEGATIVE
Leukocytes,Ua: NEGATIVE
Nitrite: NEGATIVE
Specific Gravity, Urine: 1.015 (ref 1.000–1.030)
Total Protein, Urine: NEGATIVE
Urine Glucose: NEGATIVE
Urobilinogen, UA: 0.2 (ref 0.0–1.0)
pH: 6 (ref 5.0–8.0)

## 2021-10-08 LAB — VITAMIN D 25 HYDROXY (VIT D DEFICIENCY, FRACTURES): VITD: 31.04 ng/mL (ref 30.00–100.00)

## 2021-10-08 LAB — TSH: TSH: 1.06 u[IU]/mL (ref 0.35–5.50)

## 2021-10-08 LAB — VITAMIN B12: Vitamin B-12: 228 pg/mL (ref 211–911)

## 2021-10-08 MED ORDER — VITAMIN B-12 1000 MCG SL SUBL: 1.0000 | SUBLINGUAL_TABLET | Freq: Every day | SUBLINGUAL | 3 refills | Status: AC

## 2021-10-08 MED ORDER — VITAMIN D3 50 MCG (2000 UT) PO CAPS: 2000.0000 [IU] | ORAL_CAPSULE | Freq: Every day | ORAL | 3 refills | Status: AC

## 2021-10-15 DIAGNOSIS — H26491 Other secondary cataract, right eye: Secondary | ICD-10-CM | POA: Diagnosis not present

## 2021-10-24 DIAGNOSIS — L02231 Carbuncle of abdominal wall: Secondary | ICD-10-CM | POA: Diagnosis not present

## 2021-10-24 DIAGNOSIS — L02234 Carbuncle of groin: Secondary | ICD-10-CM | POA: Diagnosis not present

## 2021-10-24 DIAGNOSIS — L309 Dermatitis, unspecified: Secondary | ICD-10-CM | POA: Diagnosis not present

## 2021-10-30 ENCOUNTER — Telehealth: Payer: Self-pay

## 2021-10-30 NOTE — Progress Notes (Signed)
? ? ?  Chronic Care Management ?Pharmacy Assistant  ? ?Name: Amanda Arnold  MRN: [id number] DOB: 08-06-1945 ? ? ? ?Reason for Encounter: Disease State-General ?  ? ?Recent office visits:   ?10/07/21 Plotnikov, Evie Lacks, MD-PCP (Paresthesia) Blood work ordered, no med changes ? ?Recent consult visits:  ?None since last coordination call ? ?Hospital visits:  ?None in previous 6 months ? ?Medications: ?Outpatient Encounter Medications as of 10/30/2021  ?Medication Sig  ? ALPRAZolam (XANAX) 0.5 MG tablet Take 1 tablet (0.5 mg total) by mouth 2 (two) times daily as needed for anxiety. TAKE 1 TABLET(0.5 MG) BY MOUTH TWICE DAILY AS NEEDED FOR ANXIETY  ? Cholecalciferol (VITAMIN D3) 50 MCG (2000 UT) capsule Take 1 capsule (2,000 Units total) by mouth daily.  ? Cyanocobalamin (VITAMIN B-12) 1000 MCG SUBL Place 1 tablet (1,000 mcg total) under the tongue daily.  ? HYDROcodone-acetaminophen (NORCO) 7.5-325 MG tablet Take 1 tablet by mouth every 8 (eight) hours as needed for severe pain.  ? ibuprofen (ADVIL) 200 MG tablet Take 600 mg by mouth every 6 (six) hours as needed for mild pain or moderate pain.  ? rosuvastatin (CRESTOR) 10 MG tablet Take 1 tablet (10 mg total) by mouth daily.  ? ?No facility-administered encounter medications on file as of 10/30/2021.  ? ?Have you had any problems recently with your health?Patient states that she is doing ok today and she does not have any new issues. She states that she takes all the medications prescribed to her. ? ?Have you had any problems with your pharmacy?Patient states that the pharmacy did not call her to let her know she had prescriptions(Vitamin D and B-12) to be picked up so she had not taken them.she does not have any problems with the cost of medications from the pharmacy ? ?What issues or side effects are you having with your medications?Patient is not having any side effects ? ?What would you like me to pass along to [facility] for them to help you with? Patient  states that she is doing fine with all of her medications.  ? ?What can we do to take care of you better? Patient asked about the results to her urine and labs she had taken last month. States that no one has contacted her about her results. Look it labs and read Dr. Alain Marion note to her.  ? ?Care Gaps: ?Colonoscopy-08/28/14 ?Diabetic Foot Exam-NA ?Mammogram-09/18/17 ?Ophthalmology-08/23/21 ?Dexa Scan - NA ?Annual Well Visit - NA ?Micro albumin-NA ?Hemoglobin A1c- 10/07/21 ? ?Star Rating Drugs: ?Rosuvastatin 10 mg-last fill 07/11/21 90 ds ? ?Ethelene Hal ?Clinical Pharmacist Assistant ?[phone]  ?

## 2021-11-18 ENCOUNTER — Ambulatory Visit (INDEPENDENT_AMBULATORY_CARE_PROVIDER_SITE_OTHER): Payer: Medicare Other | Admitting: Internal Medicine

## 2021-11-18 ENCOUNTER — Ambulatory Visit (INDEPENDENT_AMBULATORY_CARE_PROVIDER_SITE_OTHER): Payer: Medicare Other

## 2021-11-18 ENCOUNTER — Encounter: Payer: Self-pay | Admitting: Internal Medicine

## 2021-11-18 VITALS — BP 124/72 | HR 88 | Temp 98.4°F | Ht 62.0 in | Wt 171.0 lb

## 2021-11-18 DIAGNOSIS — R0781 Pleurodynia: Secondary | ICD-10-CM | POA: Diagnosis not present

## 2021-11-18 NOTE — Progress Notes (Signed)
? ? ?Subjective:  ? ? Patient ID: Amanda Arnold, female    DOB: 1946/01/24, 76 y.o.   MRN: [id number] ? ? ? ? ? ?HPI ?Amanda Arnold is here for  ?Chief Complaint  ?Patient presents with  ? Chest Pain  ?  Left side pain underneath rib-cage (hurts when she breaths in and out)  ? ? ?Left sided pain -pain started about 2 weeks ago and started suddenly and has gotten worse.  She denies any injuries.  She has pain in the anterior ribs and is tender when she pushes on that area.  The pain is worse with coughing, hiccuping, taking deep breaths and movement.  She denies cold symptoms, fevers and abdominal pain.  She has not had any nausea, changes in bowel movements. ? ?She has been taking ibuprofen for the pain and when it is more severe taking the Vicodin.  Both help. ? ?She has a history of pancreatic tail cancer.  She had a distal pancreatectomy/splenectomy April 2022.  2/15 lymph nodes positive.  Positive lymphovascular and perineural invasion.  She completed chemotherapy.  Last CT of the abdomen 07/2021.  Due for repeat CT soon. ? ?Medications and allergies reviewed with patient and updated if appropriate. ? ?Current Outpatient Medications on File Prior to Visit  ?Medication Sig Dispense Refill  ? ALPRAZolam (XANAX) 0.5 MG tablet Take 1 tablet (0.5 mg total) by mouth 2 (two) times daily as needed for anxiety. TAKE 1 TABLET(0.5 MG) BY MOUTH TWICE DAILY AS NEEDED FOR ANXIETY 60 tablet 2  ? Cholecalciferol (VITAMIN D3) 50 MCG (2000 UT) capsule Take 1 capsule (2,000 Units total) by mouth daily. 100 capsule 3  ? Cyanocobalamin (VITAMIN B-12) 1000 MCG SUBL Place 1 tablet (1,000 mcg total) under the tongue daily. 100 tablet 3  ? HYDROcodone-acetaminophen (NORCO) 7.5-325 MG tablet Take 1 tablet by mouth every 8 (eight) hours as needed for severe pain. 60 tablet 0  ? ibuprofen (ADVIL) 200 MG tablet Take 600 mg by mouth every 6 (six) hours as needed for mild pain or moderate pain.    ? rosuvastatin (CRESTOR) 10 MG tablet Take 1 tablet  (10 mg total) by mouth daily. 90 tablet 3  ? ?No current facility-administered medications on file prior to visit.  ? ? ?Review of Systems  ?Constitutional:  Negative for fever.  ?HENT:  Negative for congestion and sinus pain.   ?Respiratory:  Negative for cough, shortness of breath and wheezing.   ?Cardiovascular:  Negative for chest pain.  ?Gastrointestinal:  Negative for abdominal pain, constipation, diarrhea and nausea.  ?Neurological:  Negative for light-headedness and headaches.  ? ?   ?Objective:  ? ?Vitals:  ? 11/18/21 1433  ?BP: 124/72  ?Pulse: 88  ?Temp: 98.4 ?F (36.9 ?C)  ?SpO2: 98%  ? ?BP Readings from Last 3 Encounters:  ?11/18/21 124/72  ?10/07/21 126/68  ?08/22/21 130/77  ? ?Wt Readings from Last 3 Encounters:  ?11/18/21 171 lb (77.6 kg)  ?10/07/21 169 lb (76.7 kg)  ?08/22/21 168 lb (76.2 kg)  ? ?Body mass index is 31.28 kg/m?. ? ?  ?Physical Exam ?Constitutional:   ?   General: She is not in acute distress (But in pain with movement, coughing and deep breaths). ?   Appearance: She is well-developed. She is not ill-appearing.  ?HENT:  ?   Head: Normocephalic.  ?Cardiovascular:  ?   Rate and Rhythm: Normal rate and regular rhythm.  ?Pulmonary:  ?   Effort: No respiratory distress.  ?   Breath  sounds: Normal breath sounds. No wheezing, rhonchi or rales.  ?Chest:  ?   Chest wall: Tenderness (Left anterior ribs under breast-approximately ribs 7-8-tenderness is focal) present.  ?Abdominal:  ?   Palpations: Abdomen is soft.  ?   Tenderness: There is no abdominal tenderness. There is no guarding or rebound.  ?Skin: ?   General: Skin is warm and dry.  ?Neurological:  ?   Mental Status: She is alert.  ? ?   ? ?DG Ribs Unilateral W/Chest Left ?CLINICAL DATA:  Left rib pain. ? ?EXAM: ?LEFT RIBS AND CHEST - 3+ VIEW ? ?COMPARISON:  Nov 14, 2020. ? ?FINDINGS: ?No fracture or other bone lesions are seen involving the ribs. There ?is no evidence of pneumothorax or pleural effusion. Both lungs are ?clear. Heart size  and mediastinal contours are within normal limits. ? ?IMPRESSION: ?Negative. ? ?Electronically Signed ?  By: Marijo Conception M.D. ?  On: 11/18/2021 15:37 ? ? ? ? ? ?Assessment & Plan:  ? ? ?Left-sided rib pain: ?Acute ?Started about 2 weeks ago and pain has worsened ?Pain is focal in the anterior left ribs under the breast approximately ribs 7-8 ?Lungs are clear, no cold symptoms and she denies other symptoms ?No injuries ?History of pancreatic cancer s/p surgery just over a year ago-CT scan 4 months ago showed no evidence of residual cancer or metastatic disease ?Rib/chest x-ray today ?Continue ibuprofen or Vicodin as needed for pain depending on severity ?Further evaluation depending on x-ray results ? ? ? ? ?

## 2021-11-18 NOTE — Patient Instructions (Signed)
? ? ?  Have a xray downstairs.    ? ? ?Medications changes include :   none ? ? ? ?

## 2021-12-02 ENCOUNTER — Ambulatory Visit: Payer: Medicare Other

## 2021-12-02 DIAGNOSIS — F411 Generalized anxiety disorder: Secondary | ICD-10-CM

## 2021-12-02 DIAGNOSIS — E559 Vitamin D deficiency, unspecified: Secondary | ICD-10-CM

## 2021-12-02 DIAGNOSIS — M1990 Unspecified osteoarthritis, unspecified site: Secondary | ICD-10-CM

## 2021-12-02 DIAGNOSIS — I10 Essential (primary) hypertension: Secondary | ICD-10-CM

## 2021-12-02 DIAGNOSIS — I2510 Atherosclerotic heart disease of native coronary artery without angina pectoris: Secondary | ICD-10-CM

## 2021-12-02 NOTE — Progress Notes (Cosign Needed Addendum)
Chronic Care Management Pharmacy Note  12/02/2021 Name:  Amanda Arnold MRN:  [id number] DOB:  10/31/1945  Summary: --patient endorses compliance with current medications  -Notes that GERD has been controlled, not currently using any medication for control -Pain managed through periodic use of norco and IBU -BP well controlled with most recent office visits  -Last A1c was noted to be increased at 7.1%  Recommendations/Changes made from today's visit: -Recommending no changes to medications at this time  - Latest A1c elevated at 7.1% - continue to monitor and add medication if necessary  Subjective: Amanda Arnold is an 76 y.o. year old female who is a primary patient of Plotnikov, Evie Lacks, MD.  The CCM team was consulted for assistance with disease management and care coordination needs.    Engaged with patient by telephone for follow up visit in response to provider referral for pharmacy case management and/or care coordination services.   Consent to Services:  The patient was given the following information about Chronic Care Management services today, agreed to services, and gave verbal consent: 1. CCM service includes personalized support from designated clinical staff supervised by the primary care provider, including individualized plan of care and coordination with other care providers 2. 24/7 contact phone numbers for assistance for urgent and routine care needs. 3. Service will only be billed when office clinical staff spend 20 minutes or more in a month to coordinate care. 4. Only one practitioner may furnish and bill the service in a calendar month. 5.The patient may stop CCM services at any time (effective at the end of the month) by phone call to the office staff. 6. The patient will be responsible for cost sharing (co-pay) of up to 20% of the service fee (after annual deductible is met). Patient agreed to services and consent obtained.  Patient Care Team: Plotnikov,  Evie Lacks, MD as PCP - General Jerline Pain, MD as PCP - Cardiology (Cardiology) Erline Levine, MD as Consulting Physician (Neurosurgery) Jerline Pain, MD as Consulting Physician (Cardiology) Ladell Pier, MD as Consulting Physician (Oncology) [name], MD as Consulting Physician (General Surgery) Tomasa Blase, [facility] as Pharmacist (Pharmacist)  Recent office visits:  11/18/2021 - Dr. Quay Burow - left sided rib pain - no changes to medications - x-ray ordered  10/07/2021 - Dr. Alain Marion - no changes to medications - f/u in 3 months  07/02/2021 - Dr. Alain Marion - no changes to medications - f/u in 3 months    Recent consult visits:  08/22/2021 - Ned Card NP - Oncology - in clinical remission from pancreatic cancer - follow up in 3-4 months 08/15/2021 - Dr. Henrene Pastor Gertie Fey - recommended omeprazole 23m daily, colonoscopy recommended - pt declined - to recheck in 1 year  06/25/2021 - EDiona BrownerNP - Cardiology - pt stopped gabapentin, emla cream, omperazole, zofran, prochlorperazine - f/u in 1 year   [facility] CT of abdomen  - trial of simethicone  06/13/2021 - Port removal   Objective:  Lab Results  Component Value Date   CREATININE 0.66 10/07/2021   BUN 16 10/07/2021   GFR 87.00 10/07/2021   GFRNONAA >60 05/22/2021   GFRAA 103 04/20/2020   NA 139 10/07/2021   K 3.9 10/07/2021   CALCIUM 9.3 10/07/2021   CO2 29 10/07/2021   GLUCOSE 124 (H) 10/07/2021    Lab Results  Component Value Date/Time   HGBA1C 7.1 (H) 10/07/2021 08:59 AM   HGBA1C 6.6 (A)  07/02/2021 10:34 AM   HGBA1C 6.6 07/02/2021 10:34 AM   HGBA1C 6.6 (A) 07/02/2021 10:34 AM   HGBA1C 6.6 07/02/2021 10:34 AM   HGBA1C 5.9 (H) 10/17/2020 07:00 AM   HGBA1C 6.7 11/04/2019 12:00 AM   GFR 87.00 10/07/2021 08:59 AM   GFR 87.58 10/29/2020 10:45 AM    Last diabetic Eye exam:  Lab Results  Component Value Date/Time   HMDIABEYEEXA No Retinopathy 08/23/2021 12:00 AM    Last diabetic Foot  exam:  No results found for: HMDIABFOOTEX   Lab Results  Component Value Date   CHOL 167 09/17/2017   HDL 53.00 09/17/2017   LDLCALC 96 09/17/2017   LDLDIRECT 56 06/25/2021   TRIG 92.0 09/17/2017   CHOLHDL 3 09/17/2017       Latest Ref Rng & Units 10/07/2021    8:59 AM 06/25/2021    9:58 AM 05/22/2021    8:54 AM  Hepatic Function  Total Protein 6.0 - 8.3 g/dL 7.0   6.5   6.5    Albumin 3.5 - 5.2 g/dL 4.2   4.3   3.9    AST 0 - 37 U/L 20   22   21     ALT 0 - 35 U/L 13   15   13     Alk Phosphatase 39 - 117 U/L 83   89   64    Total Bilirubin 0.2 - 1.2 mg/dL 0.7   0.4   0.7      Lab Results  Component Value Date/Time   TSH 1.06 10/07/2021 08:59 AM   TSH 1.24 02/16/2020 08:57 AM   FREET4 1.2 02/16/2020 08:57 AM       Latest Ref Rng & Units 10/07/2021    8:59 AM 05/22/2021    8:54 AM 04/24/2021    9:15 AM  CBC  WBC 4.0 - 10.5 K/uL 7.5   7.8   7.2    Hemoglobin 12.0 - 15.0 g/dL 13.6   12.8   12.7    Hematocrit 36.0 - 46.0 % 41.2   38.8   37.4    Platelets 150.0 - 400.0 K/uL 290.0   278   200      Lab Results  Component Value Date/Time   VD25OH 31.04 10/07/2021 08:59 AM   VD25OH 34 02/25/2010 05:47 PM    Clinical ASCVD: No  The ASCVD Risk score (Arnett DK, et al., 2019) failed to calculate for the following reasons:   Cannot find a previous HDL lab   Cannot find a previous total cholesterol lab       11/18/2021    2:40 PM 12/12/2020    3:08 PM 11/01/2019   11:19 AM  Depression screen PHQ 2/9  Decreased Interest 0 1 0  Down, Depressed, Hopeless 0 1 0  PHQ - 2 Score 0 2 0    Social History   Tobacco Use  Smoking Status Former   Types: Cigarettes   Quit date: 07/10/2013   Years since quitting: 8.4  Smokeless Tobacco Current   Types: Snuff   BP Readings from Last 3 Encounters:  11/18/21 124/72  10/07/21 126/68  08/22/21 130/77   Pulse Readings from Last 3 Encounters:  11/18/21 88  10/07/21 72  08/22/21 75   Wt Readings from Last 3 Encounters:   11/18/21 171 lb (77.6 kg)  10/07/21 169 lb (76.7 kg)  08/22/21 168 lb (76.2 kg)   BMI Readings from Last 3 Encounters:  11/18/21 31.28 kg/m  10/07/21 30.91 kg/m  08/22/21 30.73 kg/m    Assessment/Interventions: Review of patient past medical history, allergies, medications, health status, including review of consultants reports, laboratory and other test data, was performed as part of comprehensive evaluation and provision of chronic care management services.   SDOH:  (Social Determinants of Health) assessments and interventions performed: Yes  SDOH Screenings   Alcohol Screen: Low Risk    Last Alcohol Screening Score (AUDIT): 0  Depression (PHQ2-9): Low Risk    PHQ-2 Score: 0  Financial Resource Strain: Low Risk    Difficulty of Paying Living Expenses: Not hard at all  Food Insecurity: No Food Insecurity   Worried About Charity fundraiser in the Last Year: Never true   Ran Out of Food in the Last Year: Never true  Housing: Low Risk    Last Housing Risk Score: 0  Physical Activity: Sufficiently Active   Days of Exercise per Week: 5 days   Minutes of Exercise per Session: 30 min  Social Connections: Engineer, building services of Communication with Friends and Family: More than three times a week   Frequency of Social Gatherings with Friends and Family: Twice a week   Attends Religious Services: 1 to 4 times per year   Active Member of [employer] or Organizations: No   Attends Music therapist: 1 to 4 times per year   Marital Status: Married  Stress: No Stress Concern Present   Feeling of Stress : Not at all  Tobacco Use: High Risk   Smoking Tobacco Use: Former   Smokeless Tobacco Use: Current   Passive Exposure: Not on Pensions consultant Needs: No Transportation Needs   Lack of Transportation (Medical): No   Lack of Transportation (Non-Medical): No    CCM Care Plan  Allergies  Allergen Reactions   Morphine And Related Shortness Of Breath    Bupropion Hcl Other (See Comments)    "does not feel right"   Ezetimibe Other (See Comments)    REACTION: legs burning   Metformin And Related Diarrhea    Diarrhea w/XR or regular    Naproxen     Upset stomach, diarrhea   Trazodone Hcl Swelling   Tramadol Hcl Palpitations    REACTION: palpitations    Medications Reviewed Today     Reviewed by Tomasa Blase, [facility] (Pharmacist) on 12/02/21 at [place] List Status: <None>   Medication Order Taking? Sig Documenting Provider Last Dose Status Informant  ALPRAZolam (XANAX) 0.5 MG tablet [id number] Yes Take 1 tablet (0.5 mg total) by mouth 2 (two) times daily as needed for anxiety. TAKE 1 TABLET(0.5 MG) BY MOUTH TWICE DAILY AS NEEDED FOR ANXIETY Plotnikov, Evie Lacks, MD Taking Active   Cholecalciferol (VITAMIN D3) 50 MCG (2000 UT) capsule [id number] Yes Take 1 capsule (2,000 Units total) by mouth daily. Plotnikov, Evie Lacks, MD Taking Active   Cyanocobalamin (VITAMIN B-12) 1000 MCG SUBL [id number] Yes Place 1 tablet (1,000 mcg total) under the tongue daily. Plotnikov, Evie Lacks, MD Taking Active   HYDROcodone-acetaminophen (NORCO) 7.5-325 MG tablet [id number] Yes Take 1 tablet by mouth every 8 (eight) hours as needed for severe pain. Plotnikov, Evie Lacks, MD Taking Active   ibuprofen (ADVIL) 200 MG tablet [id number] Yes Take 200 mg by mouth every 6 (six) hours as needed for mild pain or moderate pain. [provider] Taking Active Self  rosuvastatin (CRESTOR) 10 MG tablet [id number] Yes Take 1 tablet (10 mg total) by mouth daily. Lenna Sciara, NP Taking Active  Med List Note Darl Pikes, RPH-CPP 01/04/21 1540): Xeloda filled at [place]            Patient Active Problem List   Diagnosis Date Noted   Pruritus of vulva 10/07/2021   Symptoms involving urinary system 10/07/2021   Grief at loss of child 07/02/2021   Hyperglycemia 07/02/2021   Hypotension 04/15/2021   Urinary frequency 04/15/2021   Genetic  testing 12/04/2020   Family history of breast cancer 11/26/2020   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   Malnutrition of moderate degree 10/19/2020   Weight loss 09/04/2020   Cancer of pancreas, tail ([place]) 08/28/2020   Shoulder pain, right 07/10/2020   Coronary atherosclerosis 05/28/2020   Pulmonary nodule 05/28/2020   Palpitations 02/16/2020   Dysuria 07/27/2019   Piriformis syndrome 03/29/2019   Humerus fracture 01/27/2019   Closed fracture of left proximal humerus 12/07/2018   Trochanteric bursitis of right hip 07/12/2018   Trochanteric bursitis of both hips 03/23/2018   Neck pain 02/11/2018   Peroneal mononeuropathy, left 01/26/2017   Skin cyst 09/26/2016   Adjustment disorder with mixed anxiety and depressed mood 06/27/2016   Fatigue 03/28/2016   Restless leg syndrome 03/28/2016   Right lower quadrant abdominal pain 08/15/2015   Well adult exam 07/03/2015   Meralgia paraesthetica 05/29/2015   Paresthesia 05/15/2015   Sciatic leg pain 09/22/2014   Rash and nonspecific skin eruption 09/11/2014   Right lower quadrant pain 09/11/2014   Nonspecific abnormal electrocardiogram (ECG) (EKG) 07/27/2014   Precordial pain 07/27/2014   Cerumen impaction 06/16/2014   Chest pain, atypical 03/03/2014   UTI (urinary tract infection) 03/29/2013   Herpes zoster 03/02/2013   Abscess of left leg 02/02/2013   Leg pain, bilateral 12/09/2012   Left groin pain 09/09/2012   Unspecified sinusitis (chronic) 08/25/2012   Chest wall pain 05/20/2012   Nocturia 12/24/2011   Psychosomatic pain 10/14/2011   Left shoulder pain 08/22/2011   Vertigo 06/13/2011   Syncopal vertigo 06/13/2011   Intertriginous candidiasis 03/07/2011   Diabetes mellitus type 2 in obese ([place]) 03/07/2011   HIP PAIN 06/17/2010   HYPERGLYCEMIA 05/15/2010   CENTRAL SLEEP APNEA CONDS CLASSIFIED ELSEWHERE 05/14/2010   Obstructive sleep apnea 04/17/2010   Obesity 04/10/2010   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   DYSPNEA 03/15/2010    TRIGGER FINGER 12/18/2009   Abdominal pain, epigastric 12/18/2009   KNEE PAIN 07/11/2009   NEURALGIA, 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/2009   COLD SORE 05/11/2009   T M J 05/11/2009   BREAST PAIN 04/13/2009   Edema 03/01/2009   CERVICAL LYMPHADENOPATHY 03/01/2009   PRURITUS 07/28/2008   TACHYCARDIA 07/28/2008   INSOMNIA, PERSISTENT 02/22/2008   Cystitis 02/22/2008   SWEATING 01/18/2008   BRONCHITIS, ACUTE 12/08/2007   Chronic fatigue 12/08/2007   Headache(784.0) 12/08/2007   Cough 12/08/2007   ARTHRALGIA 11/19/2007   LOW BACK PAIN 01/27/2007   Tobacco use disorder 11/28/2006   Generalized anxiety disorder 05/25/2006   Essential hypertension 05/25/2006   COPD exacerbation ([place]) 05/25/2006   Osteoarthritis 05/25/2006   Polyuria 05/25/2006   Dyslipidemia 04/30/2006   Myalgia 04/02/2006   Anxiety and depression 01/02/2006   Disturbance of skin sensation 05/29/2005   ESOTROPIA, LEFT EYE 05/21/1946    Immunization History  Administered Date(s) Administered   HiB (PRP-OMP) 09/04/2020   Meningococcal B, Unspecified 09/04/2020   Pneumococcal Conjugate-13 06/27/2016   Pneumococcal Polysaccharide-23 04/14/2005, 09/17/2017, 09/04/2020   Tdap 04/28/2019    Conditions to be addressed/monitored:  Hypertension, Hyperlipidemia, Coronary Artery Disease, GERD, Anxiety, Osteoarthritis, and Pancreatic Cancer  Care Plan : CCM Care Plan  Updates made by Tomasa Blase, RPH since 12/02/2021 12:00 AM     Problem: Hypertension, Hyperlipidemia, Coronary Artery Disease, GERD, Anxiety, and Osteoarthritis   Priority: High  Onset Date: 03/25/2021     Long-Range Goal: Disease Management   Start Date: 03/25/2021  Expected End Date: 09/22/2021  Recent Progress: On track  Priority: High  Note:   Current Barriers:  Unable to independently monitor therapeutic efficacy  Pharmacist Clinical Goal(s):  Patient will achieve adherence to monitoring guidelines and medication adherence to achieve therapeutic  efficacy  Interventions: 1:1 collaboration with Plotnikov, Evie Lacks, MD regarding development and update of comprehensive plan of care as evidenced by provider attestation and co-signature Inter-disciplinary care team collaboration (see longitudinal plan of care) Comprehensive medication review performed; medication list updated in electronic medical record  Hypertension (BP goal <140/90) -Controlled -Current treatment: N/a - diet and lifestyle controlled at this time -Medications previously tried: furosemide, metoprolol   -Current home readings: checking at office visits BP Readings from Last 3 Encounters:  11/18/21 124/72  10/07/21 126/68  08/22/21 130/77  -Current dietary habits: lightly salted, 1 cup of coffee on occasion -Current exercise habits: none at this time  -Denies hypotensive/hypertensive symptoms -Educated on BP goals and benefits of medications for prevention of heart attack, stroke and kidney damage; Daily salt intake goal < 2300 mg; Exercise goal of 150 minutes per week; -Counseled on diet and exercise extensively Recommended to continue current medication  Hyperlipidemia/ CAD: (LDL goal < 100) -Controlled  -Last LDL: 86m/dL -Current treatment: Rosuvastatin 120m- 1 tablet daily  -Medications previously tried: n/a  -Current dietary patterns: reports that she eats mostly chicken, denies eating fried/ fatty foods -Current exercise habits: none at this time  -Educated on Cholesterol goals;  Benefits of statin for ASCVD risk reduction; Importance of limiting foods high in cholesterol; Exercise goal of 150 minutes per week; -Counseled on diet and exercise extensively Recommended to continue current medication  Depression/Anxiety (Goal: Prevention of anxiety attacks / promotion of positive mood) -Not ideally controlled -Current treatment: Alprazolam 0.61m27m 1 tablet twice daily as needed  - usually taking 1/2 tablet nightly  -Medications previously  tried/failed: diazepam, hydroxyzine, lorazepam, esctialopram, nortriptyline, paroxetine,  -GAD7: 12 -Educated on Benefits of medication for symptom control Benefits of cognitive-behavioral therapy with or without medication -Recommended to continue current medication Educated on benefits for CBT  -Continue current medications   Chronic Pain / OA/ Neuropathy (Goal: pain control) -Controlled -Current treatment  Ibuprofen 200m41m1 tablet every 6 hours as needed  - taking 1 tablet 2-3 times weekly Hydrocodone-APAP 7.5-3261mg - 1 tablet every 8 hours as needed  - at most takes twice daily  -Medications previously tried: meloxicam, celecoxib, percocet, methocarbamol, cyclobenzaprine, tizanidine, diclofenac, naproxen, oxycodone,   -Recommended for patient to continue current medications, advised to limit NSAID usage to help prevent acid reflux / ulcer - pt agreeable to plan   Patient Goals/Self-Care Activities Patient will:  - take medications as prescribed target a minimum of 150 minutes of moderate intensity exercise weekly  Follow Up Plan: Telephone follow up appointment with care management team member scheduled for: 6 months The patient has been provided with contact information for the care management team and has been advised to call with any health related questions or concerns.         Medication Assistance: None required.  Patient affirms current coverage meets needs.   Patient's preferred pharmacy is:  [facility]  [place], [place] - [address] [place] [address] Phone: [phone] Fax: [phone]  [facility] [place], [address] [place] [address] Phone: [phone] Fax: [phone]  [place], [place] - [address] [place] AT [facility] [place] [address] Phone: [phone] Fax:  [phone]    Uses pill box? No - able to manage without use of pill box Pt endorses 100% compliance  Care Plan and Follow Up Patient Decision:  Patient agrees to Care Plan and Follow-up.  Plan: Telephone follow up appointment with care management team member scheduled for:  2 months and The patient has been provided with contact information for the care management team and has been advised to call with any health related questions or concerns.   Tomasa Blase, PharmD Clinical Pharmacist, [place] screening examination/treatment/procedure(s) were performed by non-physician practitioner and as supervising physician I was immediately available for consultation/collaboration.  I agree with above. Lew Dawes, MD

## 2021-12-02 NOTE — Patient Instructions (Signed)
Visit Information  Following are the goals we discussed today:   Manage My Medicine   Timeframe:  Long-Range Goal Priority:  Medium Start Date:  12/02/2021                           Expected End Date:  12/03/2022                     Follow Up Date 05/2022   - call for medicine refill 2 or 3 days before it runs out - call if I am sick and can't take my medicine - keep a list of all the medicines I take; vitamins and herbals too - learn to read medicine labels    Why is this important?   These steps will help you keep on track with your medicines.  Plan: Telephone follow up appointment with care management team member scheduled for:  6 months The patient has been provided with contact information for the care management team and has been advised to call with any health related questions or concerns.   Tomasa Blase, PharmD Clinical Pharmacist, [facility]   Please call the care guide team at [phone] if you need to cancel or reschedule your appointment.   Patient verbalizes understanding of instructions and care plan provided today and agrees to view in [place]. Active MyChart status and patient understanding of how to access instructions and care plan via MyChart confirmed with patient.

## 2021-12-16 ENCOUNTER — Ambulatory Visit (INDEPENDENT_AMBULATORY_CARE_PROVIDER_SITE_OTHER): Payer: Medicare Other

## 2021-12-16 VITALS — Ht 62.0 in | Wt 171.0 lb

## 2021-12-16 DIAGNOSIS — Z0000 Encounter for general adult medical examination without abnormal findings: Secondary | ICD-10-CM

## 2021-12-16 NOTE — Progress Notes (Cosign Needed Addendum)
Subjective:   Amanda Arnold is a 76 y.o. female who presents for Medicare Annual (Subsequent) preventive examination.  Review of Systems    Virtual Visit via Telephone Note  I connected with  Amanda Arnold on 12/16/21 at  2:00 PM EDT by telephone and verified that I am speaking with the correct person using two identifiers.  Location: Patient: Home Provider: Office Persons participating in the virtual visit: patient/Nurse Health Advisor   I discussed the limitations, risks, security and privacy concerns of performing an evaluation and management service by telephone and the availability of in person appointments. The patient expressed understanding and agreed to proceed.  Interactive audio and video telecommunications were attempted between this nurse and patient, however failed, due to patient having technical difficulties OR patient did not have access to video capability.  We continued and completed visit with audio only.  Some vital signs may be absent or patient reported.   Amanda Peaches, LPN  Cardiac Risk Factors include: advanced age (>18mn, >>39women);diabetes mellitus;hypertension     Objective:    Today's Vitals   12/16/21 1405  Weight: 171 lb (77.6 kg)  Height: '5\' 2"'$  (1.575 m)   Body mass index is 31.28 kg/m.     12/16/2021    2:19 PM 08/22/2021    8:57 AM 04/24/2021   11:00 AM 03/27/2021   10:53 AM 03/27/2021    9:34 AM 03/13/2021    1:16 PM 01/30/2021   11:48 AM  Advanced Directives  Does Patient Have a Medical Advance Directive? No No No No No No No  Would patient like information on creating a medical advance directive? No - Patient declined No - Patient declined No - Patient declined No - Patient declined No - Patient declined No - Patient declined No - Patient declined    Current Medications (verified) Outpatient Encounter Medications as of 12/16/2021  Medication Sig   ALPRAZolam (XANAX) 0.5 MG tablet Take 1 tablet (0.5 mg total) by mouth 2 (two)  times daily as needed for anxiety. TAKE 1 TABLET(0.5 MG) BY MOUTH TWICE DAILY AS NEEDED FOR ANXIETY   Cholecalciferol (VITAMIN D3) 50 MCG (2000 UT) capsule Take 1 capsule (2,000 Units total) by mouth daily.   Cyanocobalamin (VITAMIN B-12) 1000 MCG SUBL Place 1 tablet (1,000 mcg total) under the tongue daily.   HYDROcodone-acetaminophen (NORCO) 7.5-325 MG tablet Take 1 tablet by mouth every 8 (eight) hours as needed for severe pain.   ibuprofen (ADVIL) 200 MG tablet Take 200 mg by mouth every 6 (six) hours as needed for mild pain or moderate pain.   rosuvastatin (CRESTOR) 10 MG tablet Take 1 tablet (10 mg total) by mouth daily.   No facility-administered encounter medications on file as of 12/16/2021.    Allergies (verified) Morphine and related, Bupropion hcl, Ezetimibe, Metformin and related, Naproxen, Trazodone hcl, and Tramadol hcl   History: Past Medical History:  Diagnosis Date   Abdominal pain, epigastric 12/18/2009   ANXIETY 05/25/2006   BRONCHITIS, ACUTE 079/08/4095  Complication of anesthesia     difficulty waking up x 1 - "years ago"   Constipation    COPD 05/25/2006   DEPRESSION 01/02/2006   ESOTROPIA, LEFT EYE 102-13-49  Family history of breast cancer 11/26/2020   FIBROMYALGIA 04/02/2006   GERD (gastroesophageal reflux disease)    Headache(784.0) 12/08/2007   History of blood transfusion    HYPERLIPIDEMIA 04/30/2006   HYPERTENSION 05/25/2006   Hypertension    INSOMNIA, PERSISTENT 02/22/2008  Lazy eye    left - no surgery   LOW BACK PAIN 01/27/2007   Migraines    NEURALGIA, 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/2009   Neuropathy    OBSTRUCTIVE SLEEP APNEA 04/17/2010   does not use CPAP   OSTEOARTHRITIS 05/25/2006   pancreatic ca dx'd 06/2020   Polyuria    Pre-diabetes    diet controlled   Psychosomatic disease 2011   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   T M J 05/11/2009   Past Surgical History:  Procedure Laterality Date   BIOPSY  08/16/2020   Procedure: BIOPSY;  Surgeon:  Irving Copas., MD;  Location: [facility] ENDOSCOPY;  Service: Endoscopy;;   CHOLECYSTECTOMY     COLONOSCOPY     ESOPHAGOGASTRODUODENOSCOPY (EGD) WITH PROPOFOL N/A 08/16/2020   Procedure: ESOPHAGOGASTRODUODENOSCOPY (EGD) WITH PROPOFOL;  Surgeon: Irving Copas., MD;  Location: [facility] ENDOSCOPY;  Service: Endoscopy;  Laterality: N/A;   EUS N/A 08/16/2020   Procedure: UPPER ENDOSCOPIC ULTRASOUND (EUS) RADIAL;  Surgeon: Rush Landmark Telford Nab., MD;  Location: [place];  Service: Endoscopy;  Laterality: N/A;   EYE SURGERY Bilateral    cataract with lens implant   FINE NEEDLE ASPIRATION  08/16/2020   Procedure: FINE NEEDLE ASPIRATION (FNA) LINEAR;  Surgeon: Irving Copas., MD;  Location: [facility] ENDOSCOPY;  Service: Endoscopy;;   MASS EXCISION Left 10/06/2016   Procedure: EXCISION LEFT BUTTOCKS MASS;  Surgeon: Alphonsa Overall, MD;  Location: [place];  Service: General;  Laterality: Left;   PORT-A-CATH REMOVAL N/A 06/13/2021   Procedure: PORT REMOVAL;  Surgeon: [name], MD;  Location: [place];  Service: General;  Laterality: N/A;   PORTACATH PLACEMENT N/A 11/14/2020   Procedure: INSERTION PORT-A-CATH;  Surgeon: [name], MD;  Location: [place];  Service: General;  Laterality: N/A;   XI ROBOTIC ASSISTED LAPAROSCOPIC DISTAL PANCREATECTOMY N/A 10/17/2020   Procedure: XI ROBOTIC ASSISTED DISTAL PANCREATECTOMY AND SPLENECTOMY;  Surgeon: [name], MD;  Location: [place];  Service: General;  Laterality: N/A;   Family History  Problem Relation Age of Onset   Heart attack Father    Hypertension Brother    Heart disease Mother    Cervical cancer Mother        dx unknown age   Skin cancer Mother        dx unknown age   Breast cancer Maternal Aunt        dx after 25   Cancer Maternal Uncle        ? colon; dx after 20   Leukemia Brother        d. >50   Skin cancer Daughter        basal cell carcinoma   Colon cancer Neg Hx    Stomach cancer Neg Hx    Social History    Socioeconomic History   Marital status: Married    Spouse name: Not on file   Number of children: 5   Years of education: Not on file   Highest education level: Not on file  Occupational History   Occupation: house wife    Employer: UNEMPLOYED  Tobacco Use   Smoking status: Former    Types: Cigarettes    Quit date: 07/10/2013    Years since quitting: 8.4   Smokeless tobacco: Current    Types: Snuff  Vaping Use   Vaping Use: Never used  Substance and Sexual Activity   Alcohol use: No    Alcohol/week: 0.0 standard drinks   Drug use: No   Sexual activity: Not Currently  Birth control/protection: Post-menopausal  Other Topics Concern   Not on file  Social History Narrative   Not on file   Social Determinants of Health   Financial Resource Strain: Low Risk    Difficulty of Paying Living Expenses: Not hard at all  Food Insecurity: No Food Insecurity   Worried About Charity fundraiser in the Last Year: Never true   Arboriculturist in the Last Year: Never true  Transportation Needs: No Transportation Needs   Lack of Transportation (Medical): No   Lack of Transportation (Non-Medical): No  Physical Activity: Inactive   Days of Exercise per Week: 0 days   Minutes of Exercise per Session: 0 min  Stress: No Stress Concern Present   Feeling of Stress : Not at all  Social Connections: Socially Integrated   Frequency of Communication with Friends and Family: More than three times a week   Frequency of Social Gatherings with Friends and Family: More than three times a week   Attends Religious Services: More than 4 times per year   Active Member of [employer] or Organizations: Yes   Attends Music therapist: More than 4 times per year   Marital Status: Married    Tobacco Counseling Ready to quit: No Counseling given: Yes   Clinical Intake:  Pre-visit preparation completed: NoNutrition Risk Assessment:  Has the patient had any N/V/D within the last 2 months?   No  Does the patient have any non-healing wounds?  No  Has the patient had any unintentional weight loss or weight gain?  No   Diabetes:  Is the patient diabetic?  Yes  Pre Diabetic If diabetic, was a CBG obtained today?  No  Did the patient bring in their glucometer from home?  No  How often do you monitor your CBG's? PRN.   Financial Strains and Diabetes Management:  Are you having any financial strains with the device, your supplies or your medication? No .  Does the patient want to be seen by Chronic Care Management for management of their diabetes?  No  Would the patient like to be referred to a Nutritionist or for Diabetic Management?  No   Diabetic Exams:  Diabetic Eye Exam: Completed Yes. Overdue for diabetic eye exam. Pt has been advised about the importance in completing this exam. A referral has been placed today. Message sent to referral coordinator for scheduling purposes. Advised pt to expect a call from office referred to regarding appt.  Diabetic Foot Exam: Completed Yes. Pt has been advised about the importance in completing this exam. Pt is scheduled for diabetic foot exam on Followed by PCP.     Diabetic? Pre Diabetic   Activities of Daily Living    12/16/2021    2:16 PM 06/13/2021    2:01 PM  In your present state of health, do you have any difficulty performing the following activities:  Hearing? 0   Vision? 0   Difficulty concentrating or making decisions? 0   Walking or climbing stairs? 0   Dressing or bathing? 0   Doing errands, shopping?  0  Preparing Food and eating ? N   Using the Toilet? N   In the past six months, have you accidently leaked urine? N   Do you have problems with loss of bowel control? N   Managing your Medications? N   Managing your Finances? N   Housekeeping or managing your Housekeeping? N     Patient Care Team: Lew Dawes  V, MD as PCP - General Jerline Pain, MD as PCP - Cardiology (Cardiology) Erline Levine, MD  as Consulting Physician (Neurosurgery) Jerline Pain, MD as Consulting Physician (Cardiology) Ladell Pier, MD as Consulting Physician (Oncology) [name], MD as Consulting Physician (General Surgery) Szabat, Darnelle Maffucci, [facility] as Pharmacist (Pharmacist)  Indicate any recent Medical Services you may have received from other than [facility] providers in the past year (date may be approximate).     Assessment:   This is a routine wellness examination for Saryn.  Hearing/Vision screen Hearing Screening - Comments:: No hearing difficulty Vision Screening - Comments:: No vision difficulty/Cataract surgery.  Dietary issues and exercise activities discussed: Exercise limited by: None identified   Goals Addressed               This Visit's Progress     Increase physical activity (pt-stated)         Depression Screen    12/16/2021    2:13 PM 11/18/2021    2:40 PM 12/12/2020    3:08 PM 11/01/2019   11:19 AM 10/28/2018    9:11 AM 12/21/2017   10:18 AM 09/26/2016   10:10 AM  PHQ 2/9 Scores  PHQ - 2 Score 0 0 2 0 '2 1 1  '$ PHQ- 9 Score     7      Fall Risk    12/16/2021    2:17 PM 11/18/2021    2:40 PM 12/07/2020    9:37 AM 11/01/2019   11:18 AM 10/28/2018    9:11 AM  Fall Risk   Falls in the past year? 0 0 0 1 0  Number falls in past yr: 0 0 0 0 0  Injury with Fall? 0 0 0 1   Comment    broke shoulder   Risk for fall due to : No Fall Risks No Fall Risks  Other (Comment)   Risk for fall due to: Comment    syncope episode at grocery store   Follow up  Falls evaluation completed  Falls evaluation completed;Education provided;Falls prevention discussed     FALL RISK PREVENTION PERTAINING TO THE HOME:  Any stairs in or around the home? Yes  If so, are there any without handrails? No  Home free of loose throw rugs in walkways, pet beds, electrical cords, etc? Yes  Adequate lighting in your home to reduce risk of falls? Yes   ASSISTIVE DEVICES UTILIZED TO PREVENT FALLS:  Life alert?  No  Use of a cane, walker or w/c? Yes  Grab bars in the bathroom? Yes  Shower chair or bench in shower? Yes  Elevated toilet seat or a handicapped toilet? No   TIMED UP AND GO:  Was the test performed? No . Audio Visit  Cognitive Function:       12/16/2021    2:19 PM  6CIT Screen  What Year? 0 points  What month? 0 points  What time? 0 points  Count back from 20 0 points  Months in reverse 2 points  Repeat phrase 4 points  Total Score 6 points    Immunizations Immunization History  Administered Date(s) Administered   HiB (PRP-OMP) 09/04/2020   Meningococcal B, Unspecified 09/04/2020   Pneumococcal Conjugate-13 06/27/2016   Pneumococcal Polysaccharide-23 04/14/2005, 09/17/2017, 09/04/2020   Tdap 04/28/2019    TDAP status: Up to date  Flu Vaccine status: Declined, Education has been provided regarding the importance of this vaccine but patient still declined. Advised may receive this vaccine at  local pharmacy or Health Dept. Aware to provide a copy of the vaccination record if obtained from local pharmacy or Health Dept. Verbalized acceptance and understanding.  Pneumococcal vaccine status: Up to date  Covid-19 vaccine status: Declined, Education has been provided regarding the importance of this vaccine but patient still declined. Advised may receive this vaccine at local pharmacy or Health Dept.or vaccine clinic. Aware to provide a copy of the vaccination record if obtained from local pharmacy or Health Dept. Verbalized acceptance and understanding.  Qualifies for Shingles Vaccine? Yes   Zostavax completed No   Shingrix Completed?: No.    Education has been provided regarding the importance of this vaccine. Patient has been advised to call insurance company to determine out of pocket expense if they have not yet received this vaccine. Advised may also receive vaccine at local pharmacy or Health Dept. Verbalized acceptance and understanding.  Screening Tests Health  Maintenance  Topic Date Due   URINE MICROALBUMIN  Never done   Hepatitis C Screening  Never done   FOOT EXAM  10/28/2019   Zoster Vaccines- Shingrix (1 of 2) 03/18/2022 (Originally 04/26/1965)   MAMMOGRAM  12/17/2022 (Originally 09/19/2019)   COLONOSCOPY (Pts 45-35yr Insurance coverage will need to be confirmed)  12/17/2022 (Originally 08/29/2019)   HEMOGLOBIN A1C  04/09/2022   OPHTHALMOLOGY EXAM  08/23/2022   TETANUS/TDAP  04/27/2029   Pneumonia Vaccine 76 Years old  Completed   DEXA SCAN  Completed   HPV VACCINES  Aged Out   INFLUENZA VACCINE  Discontinued   COVID-19 Vaccine  Discontinued    Health Maintenance  Health Maintenance Due  Topic Date Due   URINE MICROALBUMIN  Never done   Hepatitis C Screening  Never done   FOOT EXAM  10/28/2019    Colorectal cancer screening: Referral to GI placed Patient deferred. Pt aware the office will call re: appt.  Mammogram status: Ordered Patient deferred. Pt provided with contact info and advised to call to schedule appt.   Bone Density status: Completed 11/14/16. Results reflect: Bone density results: OSTEOPOROSIS. Repeat every 2 years.  Lung Cancer Screening: (Low Dose CT Chest recommended if Age 74-80years, 30 pack-year currently smoking OR have quit w/in 15years.) does qualify.   Lung Cancer Screening Referral: Patient deferred  Additional Screening:  Hepatitis C Screening: does qualify; Completed Patient deferred  Vision Screening: Recommended annual ophthalmology exams for early detection of glaucoma and other disorders of the eye. Is the patient up to date with their annual eye exam?  Yes  Who is the provider or what is the name of the office in which the patient attends annual eye exams? Patient unsure If pt is not established with a provider, would they like to be referred to a provider to establish care? No .   Dental Screening: Recommended annual dental exams for proper oral hygiene  Community Resource Referral /  Chronic Care Management:  CRR required this visit?  No   CCM required this visit?  No      Plan:     I have personally reviewed and noted the following in the patient's chart:   Medical and social history Use of alcohol, tobacco or illicit drugs  Current medications and supplements including opioid prescriptions.  Functional ability and status Nutritional status Physical activity Advanced directives List of other physicians Hospitalizations, surgeries, and ER visits in previous 12 months Vitals Screenings to include cognitive, depression, and falls Referrals and appointments  In addition, I have reviewed and discussed with  patient certain preventive protocols, quality metrics, and best practice recommendations. A written personalized care plan for preventive services as well as general preventive health recommendations were provided to patient.     Amanda Peaches, LPN   12/19/3417   Nurse Notes: None   Medical screening examination/treatment/procedure(s) were performed by non-physician practitioner and as supervising physician I was immediately available for consultation/collaboration.  I agree with above. Lew Dawes, MD

## 2021-12-16 NOTE — Patient Instructions (Addendum)
Amanda Arnold , Thank you for taking time to come for your Medicare Wellness Visit. I appreciate your ongoing commitment to your health goals. Please review the following plan we discussed and let me know if I can assist you in the future.   These are the goals we discussed:  Goals       Client understands the importance of follow-up with providers by attending scheduled visits      Eat healthier and increase water intake      Increase physical activity (pt-stated)      Manage My Medicine      Timeframe:  Long-Range Goal Priority:  Medium Start Date:  12/02/2021                           Expected End Date:  12/03/2022                     Follow Up Date 05/2022   - call for medicine refill 2 or 3 days before it runs out - call if I am sick and can't take my medicine - keep a list of all the medicines I take; vitamins and herbals too - learn to read medicine labels    Why is this important?   These steps will help you keep on track with your medicines.   Notes:       patient (pt-stated)      Book; "saying goodnight to insomnia"  Or other sleep type workbook        Patient Stated      Eat as healthy as possible and stop going back for second portions. Stay active by going out in my yard and walking around. Enjoy reading and sitting in my swing.       Patient Stated      To get healthier.      Patient Stated      To get well and be cancer free!!!      Weight < 180 lb (81.647 kg)      Plan; watches the cooking shows and weight loss programs; popcorn Foods at hs; baked kale at hs; bowl of cereal; discussed walking during commercials;  100 calories /individual serving          This is a list of the screening recommended for you and due dates:  Health Maintenance  Topic Date Due   Urine Protein Check  Never done   Hepatitis C Screening: USPSTF Recommendation to screen - Ages 2-79 yo.  Never done   Complete foot exam   10/28/2019   Zoster (Shingles) Vaccine (1 of 2)  03/18/2022*   Mammogram  12/17/2022*   Colon Cancer Screening  12/17/2022*   Hemoglobin A1C  04/09/2022   Eye exam for diabetics  08/23/2022   Tetanus Vaccine  04/27/2029   Pneumonia Vaccine  Completed   DEXA scan (bone density measurement)  Completed   HPV Vaccine  Aged Out   Flu Shot  Discontinued   COVID-19 Vaccine  Discontinued  *Topic was postponed. The date shown is not the original due date.   Opioid Pain Medicine Management Opioids are powerful medicines that are used to treat moderate to severe pain. When used for short periods of time, they can help you to: Sleep better. Do better in physical or occupational therapy. Feel better in the first few days after an injury. Recover from surgery. Opioids should be taken with the supervision of a trained  health care provider. They should be taken for the shortest period of time possible. This is because opioids can be addictive, and the longer you take opioids, the greater your risk of addiction. This addiction can also be called opioid use disorder. What are the risks? Using opioid pain medicines for longer than 3 days increases your risk of side effects. Side effects include: Constipation. Nausea and vomiting. Breathing difficulties (respiratory depression). Drowsiness. Confusion. Opioid use disorder. Itching. Taking opioid pain medicine for a long period of time can affect your ability to do daily tasks. It also puts you at risk for: Motor vehicle crashes. Depression. Suicide. Heart attack. Overdose, which can be life-threatening. What is a pain treatment plan? A pain treatment plan is an agreement between you and your health care provider. Pain is unique to each person, and treatments vary depending on your condition. To manage your pain, you and your health care provider need to work together. To help you do this: Discuss the goals of your treatment, including how much pain you might expect to have and how you will manage  the pain. Review the risks and benefits of taking opioid medicines. Remember that a good treatment plan uses more than one approach and minimizes the chance of side effects. Be honest about the amount of medicines you take and about any drug or alcohol use. Get pain medicine prescriptions from only one health care provider. Pain can be managed with many types of alternative treatments. Ask your health care provider to refer you to one or more specialists who can help you manage pain through: Physical or occupational therapy. Counseling (cognitive behavioral therapy). Good nutrition. Biofeedback. Massage. Meditation. Non-opioid medicine. Following a gentle exercise program. How to use opioid pain medicine Taking medicine Take your pain medicine exactly as told by your health care provider. Take it only when you need it. If your pain gets less severe, you may take less than your prescribed dose if your health care provider approves. If you are not having pain, do nottake pain medicine unless your health care provider tells you to take it. If your pain is severe, do nottry to treat it yourself by taking more pills than instructed on your prescription. Contact your health care provider for help. Write down the times when you take your pain medicine. It is easy to become confused while on pain medicine. Writing the time can help you avoid overdose. Take other over-the-counter or prescription medicines only as told by your health care provider. Keeping yourself and others safe  While you are taking opioid pain medicine: Do not drive, use machinery, or power tools. Do not sign legal documents. Do not drink alcohol. Do not take sleeping pills. Do not supervise children by yourself. Do not do activities that require climbing or being in high places. Do not go to a lake, river, ocean, spa, or swimming pool. Do not share your pain medicine with anyone. Keep pain medicine in a locked cabinet or  in a secure area where pets and children cannot reach it. Stopping your use of opioids If you have been taking opioid medicine for more than a few weeks, you may need to slowly decrease (taper) how much you take until you stop completely. Tapering your use of opioids can decrease your risk of symptoms of withdrawal, such as: Pain and cramping in the abdomen. Nausea. Sweating. Sleepiness. Restlessness. Uncontrollable shaking (tremors). Cravings for the medicine. Do not attempt to taper your use of opioids on your own. Talk  with your health care provider about how to do this. Your health care provider may prescribe a step-down schedule based on how much medicine you are taking and how long you have been taking it. Getting rid of leftover pills Do not save any leftover pills. Get rid of leftover pills safely by: Taking the medicine to a prescription take-back program. This is usually offered by the county or law enforcement. Bringing them to a pharmacy that has a drug disposal container. Flushing them down the toilet. Check the label or package insert of your medicine to see whether this is safe to do. Throwing them out in the trash. Check the label or package insert of your medicine to see whether this is safe to do. If it is safe to throw it out, remove the medicine from the original container, put it into a sealable bag or container, and mix it with used coffee grounds, food scraps, dirt, or cat litter before putting it in the trash. Follow these instructions at home: Activity Do exercises as told by your health care provider. Avoid activities that make your pain worse. Return to your normal activities as told by your health care provider. Ask your health care provider what activities are safe for you. General instructions You may need to take these actions to prevent or treat constipation: Drink enough fluid to keep your urine pale yellow. Take over-the-counter or prescription  medicines. Eat foods that are high in fiber, such as beans, whole grains, and fresh fruits and vegetables. Limit foods that are high in fat and processed sugars, such as fried or sweet foods. Keep all follow-up visits. This is important. Where to find support If you have been taking opioids for a long time, you may benefit from receiving support for quitting from a local support group or counselor. Ask your health care provider for a referral to these resources in your area. Where to find more information Centers for Disease Control and Prevention (CDC): http://www.wolf.info/ [place] Food and Drug Administration (FDA): [place] Get help right away if: You may have taken too much of an opioid (overdosed). Common symptoms of an overdose: Your breathing is slower or more shallow than normal. You have a very slow heartbeat (pulse). You have slurred speech. You have nausea and vomiting. Your pupils become very small. You have other potential symptoms: You are very confused. You faint or feel like you will faint. You have cold, clammy skin. You have blue lips or fingernails. You have thoughts of harming yourself or harming others. These symptoms may represent a serious problem that is an emergency. Do not wait to see if the symptoms will go away. Get medical help right away. Call your local emergency services (911 in the [place] Do not drive yourself to the hospital.  If you ever feel like you may hurt yourself or others, or have thoughts about taking your own life, get help right away. Go to your nearest emergency department or: Call your local emergency services (911 in the [place] Call the [facility] 925-062-1308 in the [place] Call a suicide crisis helpline, such as the [place] at 226-198-4852 or 988 in the [place]. This is open 24 hours a day in the [place] Text the Crisis Text Line at 570-760-1767 (in the [place] Summary Opioid medicines can help you manage  moderate to severe pain for a short period of time. A pain treatment plan is an agreement between you and your health care provider. Discuss the goals  of your treatment, including how much pain you might expect to have and how you will manage the pain. If you think that you or someone else may have taken too much of an opioid, get medical help right away. This information is not intended to replace advice given to you by your health care provider. Make sure you discuss any questions you have with your health care provider. Document Revised: 01/23/2021 Document Reviewed: 10/10/2020 Elsevier Patient Education  [place] directives: No  Conditions/risks identified: None  Next appointment: Follow up in one year for your annual wellness visit     Preventive Care 65 Years and Older, Female Preventive care refers to lifestyle choices and visits with your health care provider that can promote health and wellness. What does preventive care include? A yearly physical exam. This is also called an annual well check. Dental exams once or twice a year. Routine eye exams. Ask your health care provider how often you should have your eyes checked. Personal lifestyle choices, including: Daily care of your teeth and gums. Regular physical activity. Eating a healthy diet. Avoiding tobacco and drug use. Limiting alcohol use. Practicing safe sex. Taking low-dose aspirin every day. Taking vitamin and mineral supplements as recommended by your health care provider. What happens during an annual well check? The services and screenings done by your health care provider during your annual well check will depend on your age, overall health, lifestyle risk factors, and family history of disease. Counseling  Your health care provider may ask you questions about your: Alcohol use. Tobacco use. Drug use. Emotional well-being. Home and relationship well-being. Sexual activity. Eating  habits. History of falls. Memory and ability to understand (cognition). Work and work Statistician. Reproductive health. Screening  You may have the following tests or measurements: Height, weight, and BMI. Blood pressure. Lipid and cholesterol levels. These may be checked every 5 years, or more frequently if you are over 36 years old. Skin check. Lung cancer screening. You may have this screening every year starting at age 46 if you have a 30-pack-year history of smoking and currently smoke or have quit within the past 15 years. Fecal occult blood test (FOBT) of the stool. You may have this test every year starting at age 54. Flexible sigmoidoscopy or colonoscopy. You may have a sigmoidoscopy every 5 years or a colonoscopy every 10 years starting at age 110. Hepatitis C blood test. Hepatitis B blood test. Sexually transmitted disease (STD) testing. Diabetes screening. This is done by checking your blood sugar (glucose) after you have not eaten for a while (fasting). You may have this done every 1-3 years. Bone density scan. This is done to screen for osteoporosis. You may have this done starting at age 34. Mammogram. This may be done every 1-2 years. Talk to your health care provider about how often you should have regular mammograms. Talk with your health care provider about your test results, treatment options, and if necessary, the need for more tests. Vaccines  Your health care provider may recommend certain vaccines, such as: Influenza vaccine. This is recommended every year. Tetanus, diphtheria, and acellular pertussis (Tdap, Td) vaccine. You may need a Td booster every 10 years. Zoster vaccine. You may need this after age 81. Pneumococcal 13-valent conjugate (PCV13) vaccine. One dose is recommended after age 66. Pneumococcal polysaccharide (PPSV23) vaccine. One dose is recommended after age 49. Talk to your health care provider about which screenings and vaccines you need and how  often you need them. This information is not intended to replace advice given to you by your health care provider. Make sure you discuss any questions you have with your health care provider. Document Released: 07/27/2015 Document Revised: 03/19/2016 Document Reviewed: 05/01/2015 Elsevier Interactive Patient Education  2017 [employer] Prevention in the Home Falls can cause injuries. They can happen to people of all ages. There are many things you can do to make your home safe and to help prevent falls. What can I do on the outside of my home? Regularly fix the edges of walkways and driveways and fix any cracks. Remove anything that might make you trip as you walk through a door, such as a raised step or threshold. Trim any bushes or trees on the path to your home. Use bright outdoor lighting. Clear any walking paths of anything that might make someone trip, such as rocks or tools. Regularly check to see if handrails are loose or broken. Make sure that both sides of any steps have handrails. Any raised decks and porches should have guardrails on the edges. Have any leaves, snow, or ice cleared regularly. Use sand or salt on walking paths during winter. Clean up any spills in your garage right away. This includes oil or grease spills. What can I do in the bathroom? Use night lights. Install grab bars by the toilet and in the tub and shower. Do not use towel bars as grab bars. Use non-skid mats or decals in the tub or shower. If you need to sit down in the shower, use a plastic, non-slip stool. Keep the floor dry. Clean up any water that spills on the floor as soon as it happens. Remove soap buildup in the tub or shower regularly. Attach bath mats securely with double-sided non-slip rug tape. Do not have throw rugs and other things on the floor that can make you trip. What can I do in the bedroom? Use night lights. Make sure that you have a light by your bed that is easy to  reach. Do not use any sheets or blankets that are too big for your bed. They should not hang down onto the floor. Have a firm chair that has side arms. You can use this for support while you get dressed. Do not have throw rugs and other things on the floor that can make you trip. What can I do in the kitchen? Clean up any spills right away. Avoid walking on wet floors. Keep items that you use a lot in easy-to-reach places. If you need to reach something above you, use a strong step stool that has a grab bar. Keep electrical cords out of the way. Do not use floor polish or wax that makes floors slippery. If you must use wax, use non-skid floor wax. Do not have throw rugs and other things on the floor that can make you trip. What can I do with my stairs? Do not leave any items on the stairs. Make sure that there are handrails on both sides of the stairs and use them. Fix handrails that are broken or loose. Make sure that handrails are as long as the stairways. Check any carpeting to make sure that it is firmly attached to the stairs. Fix any carpet that is loose or worn. Avoid having throw rugs at the top or bottom of the stairs. If you do have throw rugs, attach them to the floor with carpet tape. Make sure that you have a light  switch at the top of the stairs and the bottom of the stairs. If you do not have them, ask someone to add them for you. What else can I do to help prevent falls? Wear shoes that: Do not have high heels. Have rubber bottoms. Are comfortable and fit you well. Are closed at the toe. Do not wear sandals. If you use a stepladder: Make sure that it is fully opened. Do not climb a closed stepladder. Make sure that both sides of the stepladder are locked into place. Ask someone to hold it for you, if possible. Clearly mark and make sure that you can see: Any grab bars or handrails. First and last steps. Where the edge of each step is. Use tools that help you move  around (mobility aids) if they are needed. These include: Canes. Walkers. Scooters. Crutches. Turn on the lights when you go into a dark area. Replace any light bulbs as soon as they burn out. Set up your furniture so you have a clear path. Avoid moving your furniture around. If any of your floors are uneven, fix them. If there are any pets around you, be aware of where they are. Review your medicines with your doctor. Some medicines can make you feel dizzy. This can increase your chance of falling. Ask your doctor what other things that you can do to help prevent falls. This information is not intended to replace advice given to you by your health care provider. Make sure you discuss any questions you have with your health care provider. Document Released: 04/26/2009 Document Revised: 12/06/2015 Document Reviewed: 08/04/2014 Elsevier Interactive Patient Education  2017 [employer].

## 2021-12-20 ENCOUNTER — Inpatient Hospital Stay: Payer: Medicare Other | Admitting: Oncology

## 2021-12-20 ENCOUNTER — Other Ambulatory Visit: Payer: Self-pay

## 2021-12-20 ENCOUNTER — Inpatient Hospital Stay: Payer: Medicare Other | Attending: Oncology

## 2021-12-20 VITALS — BP 121/71 | HR 69 | Temp 98.2°F | Resp 18 | Ht 62.0 in | Wt 170.6 lb

## 2021-12-20 DIAGNOSIS — C252 Malignant neoplasm of tail of pancreas: Secondary | ICD-10-CM

## 2021-12-20 DIAGNOSIS — J449 Chronic obstructive pulmonary disease, unspecified: Secondary | ICD-10-CM | POA: Insufficient documentation

## 2021-12-20 DIAGNOSIS — Z08 Encounter for follow-up examination after completed treatment for malignant neoplasm: Secondary | ICD-10-CM | POA: Diagnosis not present

## 2021-12-20 DIAGNOSIS — Z8507 Personal history of malignant neoplasm of pancreas: Secondary | ICD-10-CM | POA: Diagnosis not present

## 2021-12-20 DIAGNOSIS — I10 Essential (primary) hypertension: Secondary | ICD-10-CM | POA: Insufficient documentation

## 2021-12-20 NOTE — Progress Notes (Signed)
[facility] OFFICE PROGRESS NOTE   Diagnosis: Pancreas cancer  INTERVAL HISTORY:   Amanda Arnold returns as scheduled.  She reports malaise.  Good appetite.  She had discomfort at the left anterolateral chest last month.  She was evaluated by Dr. Quay Arnold.  Plain x-rays were negative.  The pain has resolved.  She started vitamin B12 Gummies and this discolored her urine.  Objective:  Vital signs in last 24 hours:  Blood pressure 121/71, pulse 69, temperature 98.2 F (36.8 C), temperature source Oral, resp. rate 18, height '5\' 2"'$  (1.575 m), weight 170 lb 9.6 oz (77.4 kg), SpO2 100 %.    Lymphatics: No cervical, supraclavicular, axillary, or inguinal nodes Resp: Lungs clear bilaterally Cardio: Regular rate and rhythm GI: No hepatosplenomegaly, no mass, nontender Vascular: No leg edema Musculoskeletal: No mass at the left anterolateral chest wall.  Mild tenderness at the left costal margin.   Lab Results:  Lab Results  Component Value Date   WBC 7.5 10/07/2021   HGB 13.6 10/07/2021   HCT 41.2 10/07/2021   MCV 93.5 10/07/2021   PLT 290.0 10/07/2021   NEUTROABS 3.6 10/07/2021    CMP  Lab Results  Component Value Date   NA 139 10/07/2021   K 3.9 10/07/2021   CL 104 10/07/2021   CO2 29 10/07/2021   GLUCOSE 124 (H) 10/07/2021   BUN 16 10/07/2021   CREATININE 0.66 10/07/2021   CALCIUM 9.3 10/07/2021   PROT 7.0 10/07/2021   ALBUMIN 4.2 10/07/2021   AST 20 10/07/2021   ALT 13 10/07/2021   ALKPHOS 83 10/07/2021   BILITOT 0.7 10/07/2021   GFRNONAA >60 05/22/2021   GFRAA 103 04/20/2020    Lab Results  Component Value Date   JKD326 14 08/22/2021    Medications: I have reviewed the patient's current medications.   Assessment/Plan: Pancreas cancer-stage IIb (pT2pN1) CT abdomen/pelvis 06/01/2020-2.9 x 3 cm pancreas tail mass, slight increase in size of 7 mm right middle lobe nodule MRI/MRCP 06/22/2020-solid 3.8 x 3 x 3.1 cm pancreas tail mass with focal  occlusion of the splenic vein and dilated venous collaterals, no evidence of metastatic disease, bilateral adrenal adenomas CT renal stone study 08/02/2020-scattered airspace opacities throughout the lung bases-new favored to represent infectious/inflammatory changes EUS 08/16/2020-pancreas tail mass, T2N0, FNA biopsy-suspicious for malignancy CTs 08/27/2020-unchanged right middle lobe nodule, superficial left upper back lesion, no change in pancreas tail mass, occluded splenic vein with collaterals at the splenic hilum, no enlarged abdominal pelvic lymph nodes Distal pancreatectomy/splenectomy 10/17/2020- 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  2/15 lymph nodes positive, positive lymphovascular and perineural invasion, resection margins negative, pT2pN1 Cycle 1 gemcitabine/capecitabine 11/21/2020, day 15 gemcitabine 12/05/2020 Cycle 2 gemcitabine/capecitabine 12/20/2020, day 15 gemcitabine 01/03/2021 Cycle 3 gemcitabine/capecitabine 01/16/2021, day 15 gemcitabine 01/30/2021 Cycle 4 gemcitabine/capecitabine 02/13/2021, day 15 gemcitabine 02/27/2021 Cycle 5 gemcitabine/capecitabine 03/13/2021 (capecitabine dose reduced secondary to hand/foot syndrome), day 15 gemcitabine 03/27/2021 Cycle 6 gemcitabine/capecitabine 04/10/2021, day 15 gemcitabine 04/24/2021 CT abdomen/pelvis 07/26/2021-slight increase in size of a cystic lesion at the distal pancreatic tail/stump.  Additional fluid collections at the surgical bed decreased in size.   2.   Gastric ulcer on EUS 08/16/2020-H. pylori positive 3.   COVID-19 infection January 2022 4.   COPD 5.   Hypertension 6.   Right abdomen/right leg pain-musculoskeletal? 7.   Right middle lobe nodule on CT 06/01/2020, increased from 2017, stable on CT 11/13/2020, stable on CT 04/23/2021 8.  History of tobacco use 9.   Superficial left upper back lesion noted on CT 08/27/2020        Disposition: [name] remains in clinical remission from pancreas cancer.  We will follow-up on the CA 19-9 from today.  She is scheduled to see Dr. Barry Arnold next month.  I will defer further evaluation of the pancreas cystic lesion to Dr. Barry Arnold.  She will try different oral preparation of vitamin B12.    Betsy Coder, MD  12/20/2021  9:34 AM

## 2021-12-21 LAB — CANCER ANTIGEN 19-9: CA 19-9: 11 U/mL (ref 0–35)

## 2021-12-23 ENCOUNTER — Telehealth: Payer: Self-pay

## 2021-12-23 NOTE — Telephone Encounter (Signed)
Pt verbalized understanding.

## 2021-12-23 NOTE — Telephone Encounter (Signed)
-----   Message from Ladell Pier, MD sent at 12/23/2021  2:38 PM EDT ----- Please call patient, CA 19-9 is normal, follow-up as scheduled

## 2022-01-07 ENCOUNTER — Ambulatory Visit (INDEPENDENT_AMBULATORY_CARE_PROVIDER_SITE_OTHER): Payer: Medicare Other | Admitting: Internal Medicine

## 2022-01-07 ENCOUNTER — Encounter: Payer: Self-pay | Admitting: Internal Medicine

## 2022-01-07 DIAGNOSIS — I10 Essential (primary) hypertension: Secondary | ICD-10-CM | POA: Diagnosis not present

## 2022-01-07 DIAGNOSIS — R7309 Other abnormal glucose: Secondary | ICD-10-CM | POA: Diagnosis not present

## 2022-01-07 DIAGNOSIS — C252 Malignant neoplasm of tail of pancreas: Secondary | ICD-10-CM

## 2022-01-07 DIAGNOSIS — F419 Anxiety disorder, unspecified: Secondary | ICD-10-CM | POA: Diagnosis not present

## 2022-01-07 DIAGNOSIS — G8929 Other chronic pain: Secondary | ICD-10-CM | POA: Diagnosis not present

## 2022-01-07 DIAGNOSIS — F32A Depression, unspecified: Secondary | ICD-10-CM | POA: Diagnosis not present

## 2022-01-07 DIAGNOSIS — E669 Obesity, unspecified: Secondary | ICD-10-CM

## 2022-01-07 DIAGNOSIS — E1169 Type 2 diabetes mellitus with other specified complication: Secondary | ICD-10-CM

## 2022-01-07 DIAGNOSIS — M5440 Lumbago with sciatica, unspecified side: Secondary | ICD-10-CM

## 2022-01-07 LAB — COMPREHENSIVE METABOLIC PANEL
ALT: 19 U/L (ref 0–35)
AST: 27 U/L (ref 0–37)
Albumin: 4.2 g/dL (ref 3.5–5.2)
Alkaline Phosphatase: 88 U/L (ref 39–117)
BUN: 17 mg/dL (ref 6–23)
CO2: 29 mEq/L (ref 19–32)
Calcium: 9.7 mg/dL (ref 8.4–10.5)
Chloride: 104 mEq/L (ref 96–112)
Creatinine, Ser: 0.65 mg/dL (ref 0.40–1.20)
GFR: 87.17 mL/min (ref >60.00)
Glucose, Bld: 136 mg/dL — ABNORMAL HIGH (ref 70–99)
Potassium: 4 mEq/L (ref 3.5–5.1)
Sodium: 140 mEq/L (ref 135–145)
Total Bilirubin: 0.7 mg/dL (ref 0.2–1.2)
Total Protein: 7.1 g/dL (ref 6.0–8.3)

## 2022-01-07 LAB — HEMOGLOBIN A1C: Hgb A1c MFr Bld: 6.9 % — ABNORMAL HIGH (ref 4.6–6.5)

## 2022-01-07 MED ORDER — HYDROCODONE-ACETAMINOPHEN 7.5-325 MG PO TABS: 1.0000 | ORAL_TABLET | Freq: Three times a day (TID) | ORAL | 0 refills | Status: DC | PRN

## 2022-01-07 NOTE — Assessment & Plan Note (Signed)
Check A1c Not on meds

## 2022-01-20 ENCOUNTER — Telehealth: Payer: Self-pay | Admitting: Internal Medicine

## 2022-01-20 NOTE — Telephone Encounter (Signed)
Pt daughter Levada Dy called in and is requesting a callback. She stated her mother gets very tired and weak when she takes Cyanocobalamin (VITAMIN B-12) 1000 MCG SUBL. Levada Dy would like a callback with some advice on what else to give her mother.  Please advise   CB: [phone]

## 2022-01-21 ENCOUNTER — Telehealth: Payer: Self-pay | Admitting: Internal Medicine

## 2022-01-21 NOTE — Telephone Encounter (Signed)
I was able to speak with the pts daughter and inform her of Dr. Judeen Hammans advice. Pts daughter states she will discuss the bi-weekly offer with her mother and call us back with an update about it.

## 2022-01-21 NOTE — Telephone Encounter (Signed)
We can switch her to bi weekly subcutaneous injections if she is willing.  Thanks

## 2022-01-21 NOTE — Telephone Encounter (Signed)
Daughter states mother wanted to know how long will she have to take the shot?     Please Advise         [facility] [place], [place] Phone:  [phone]  Fax:  [phone]

## 2022-01-24 DIAGNOSIS — Z1231 Encounter for screening mammogram for malignant neoplasm of breast: Secondary | ICD-10-CM | POA: Diagnosis not present

## 2022-01-24 DIAGNOSIS — M25551 Pain in right hip: Secondary | ICD-10-CM | POA: Diagnosis not present

## 2022-01-24 DIAGNOSIS — C252 Malignant neoplasm of tail of pancreas: Secondary | ICD-10-CM | POA: Diagnosis not present

## 2022-01-27 ENCOUNTER — Ambulatory Visit (INDEPENDENT_AMBULATORY_CARE_PROVIDER_SITE_OTHER): Payer: Medicare Other

## 2022-01-27 DIAGNOSIS — E538 Deficiency of other specified B group vitamins: Secondary | ICD-10-CM | POA: Diagnosis not present

## 2022-01-27 MED ORDER — CYANOCOBALAMIN 1000 MCG/ML IJ SOLN
1000.0000 ug | Freq: Once | INTRAMUSCULAR | Status: AC
  Administered 2022-01-27: 1000 ug via INTRAMUSCULAR

## 2022-01-27 NOTE — Progress Notes (Cosign Needed Addendum)
After obtaining consent, and per orders of Dr. Alain Marion, injection of B12 was given on the left deltoid by Marrian Salvage. Patient tolerated well and was instructed to report any adverse reaction to me immediately.  Medical screening examination/treatment/procedure(s) were performed by non-physician practitioner and as supervising physician I was immediately available for consultation/collaboration.  I agree with above. Lew Dawes, MD

## 2022-01-28 NOTE — Telephone Encounter (Signed)
It would be a monthly lifetime vitamin B12 injections subcutaneously.  Thanks

## 2022-01-28 NOTE — Telephone Encounter (Signed)
What shot? Thx

## 2022-01-30 ENCOUNTER — Other Ambulatory Visit: Payer: Self-pay | Admitting: General Surgery

## 2022-01-30 DIAGNOSIS — C252 Malignant neoplasm of tail of pancreas: Secondary | ICD-10-CM

## 2022-01-31 NOTE — Telephone Encounter (Signed)
Spoke with pts daughter and was able to inform her of Dr. Judeen Hammans response.

## 2022-02-03 ENCOUNTER — Other Ambulatory Visit: Payer: Self-pay

## 2022-02-11 ENCOUNTER — Other Ambulatory Visit: Payer: Self-pay

## 2022-02-11 ENCOUNTER — Other Ambulatory Visit: Payer: Medicare Other

## 2022-02-12 ENCOUNTER — Ambulatory Visit
Admission: RE | Admit: 2022-02-12 | Discharge: 2022-02-12 | Disposition: A | Discharge status: Home / Self Care | Payer: Medicare Other | Source: Ambulatory Visit | Attending: General Surgery | Admitting: General Surgery

## 2022-02-12 DIAGNOSIS — C259 Malignant neoplasm of pancreas, unspecified: Secondary | ICD-10-CM | POA: Diagnosis not present

## 2022-02-12 DIAGNOSIS — N281 Cyst of kidney, acquired: Secondary | ICD-10-CM | POA: Diagnosis not present

## 2022-02-12 DIAGNOSIS — C252 Malignant neoplasm of tail of pancreas: Secondary | ICD-10-CM

## 2022-02-12 DIAGNOSIS — D3500 Benign neoplasm of unspecified adrenal gland: Secondary | ICD-10-CM | POA: Diagnosis not present

## 2022-02-12 DIAGNOSIS — K862 Cyst of pancreas: Secondary | ICD-10-CM | POA: Diagnosis not present

## 2022-02-12 MED ORDER — GADOBENATE DIMEGLUMINE 529 MG/ML IV SOLN
15.0000 mL | Freq: Once | INTRAVENOUS | Status: AC | PRN
  Administered 2022-02-12: 15 mL via INTRAVENOUS

## 2022-02-14 ENCOUNTER — Ambulatory Visit (INDEPENDENT_AMBULATORY_CARE_PROVIDER_SITE_OTHER): Payer: Medicare Other | Admitting: *Deleted

## 2022-02-14 DIAGNOSIS — E538 Deficiency of other specified B group vitamins: Secondary | ICD-10-CM

## 2022-02-14 MED ORDER — CYANOCOBALAMIN 1000 MCG/ML IJ SOLN
1000.0000 ug | Freq: Once | INTRAMUSCULAR | Status: AC
  Administered 2022-02-14: 1000 ug via INTRAMUSCULAR

## 2022-02-14 NOTE — Progress Notes (Signed)
Pls cosign for B12 inj../lmb  

## 2022-02-17 ENCOUNTER — Other Ambulatory Visit: Payer: Self-pay | Admitting: General Surgery

## 2022-02-17 DIAGNOSIS — Z1231 Encounter for screening mammogram for malignant neoplasm of breast: Secondary | ICD-10-CM

## 2022-02-18 ENCOUNTER — Ambulatory Visit (INDEPENDENT_AMBULATORY_CARE_PROVIDER_SITE_OTHER): Payer: Medicare Other

## 2022-02-18 ENCOUNTER — Ambulatory Visit (INDEPENDENT_AMBULATORY_CARE_PROVIDER_SITE_OTHER): Payer: Medicare Other | Admitting: Family Medicine

## 2022-02-18 ENCOUNTER — Encounter: Payer: Self-pay | Admitting: Family Medicine

## 2022-02-18 VITALS — BP 126/70 | HR 64 | Temp 97.8°F | Ht 62.0 in | Wt 174.0 lb

## 2022-02-18 DIAGNOSIS — R079 Chest pain, unspecified: Secondary | ICD-10-CM | POA: Diagnosis not present

## 2022-02-18 DIAGNOSIS — H6121 Impacted cerumen, right ear: Secondary | ICD-10-CM | POA: Insufficient documentation

## 2022-02-18 DIAGNOSIS — M549 Dorsalgia, unspecified: Secondary | ICD-10-CM

## 2022-02-18 NOTE — Progress Notes (Signed)
Subjective:     Patient ID: Amanda Arnold, female    DOB: Mar 18, 1946, 76 y.o.   MRN: [id number]  Chief Complaint  Patient presents with   Ear Fullness    Right ear has been popping at night, states it is hard to hear out of for about a month now   Shoulder Pain    Right shoulder blade has been aching for about a month now. States she has been taking ibuprofen which helps but it always comes back after a while    HPI Patient is in today for a 4 week history of right ear popping and feeling clogged. Decreased hearing in right.   No fever, chills, headache, nasal congestion.   Is not using anything for symptoms.   She also complains of a 4 week history of right upper back pain. No injury. Pain is worse with certain movements and throughout the day. Denies neck or midline back pain. No right shoulder pain. Ibuprofen 200 mg resolves pain.   Denies fever, chills, dizziness, headache, URI symptoms, chest pain, palpitations, shortness of breath, cough, abdominal pain, N/V/D, LE edema.     Health Maintenance Due  Topic Date Due   Diabetic kidney evaluation - Urine ACR  Never done   Hepatitis C Screening  Never done   FOOT EXAM  10/28/2019    Past Medical History:  Diagnosis Date   Abdominal pain, epigastric 12/18/2009   ANXIETY 05/25/2006   BRONCHITIS, ACUTE 67/34/1937   Complication of anesthesia     difficulty waking up x 1 - "years ago"   Constipation    COPD 05/25/2006   DEPRESSION 01/02/2006   ESOTROPIA, LEFT EYE 11-30-47   Family history of breast cancer 11/26/2020   FIBROMYALGIA 04/02/2006   GERD (gastroesophageal reflux disease)    Headache(784.0) 12/08/2007   History of blood transfusion    HYPERLIPIDEMIA 04/30/2006   HYPERTENSION 05/25/2006   Hypertension    INSOMNIA, PERSISTENT 02/22/2008   Lazy eye    left - no surgery   LOW BACK PAIN 01/27/2007   Migraines    NEURALGIA, 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/2009   Neuropathy    OBSTRUCTIVE SLEEP APNEA 04/17/2010    does not use CPAP   OSTEOARTHRITIS 05/25/2006   pancreatic ca dx'd 06/2020   Polyuria    Pre-diabetes    diet controlled   Psychosomatic disease 2011   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   T M J 05/11/2009    Past Surgical History:  Procedure Laterality Date   BIOPSY  08/16/2020   Procedure: BIOPSY;  Surgeon: Irving Copas., MD;  Location: [facility] ENDOSCOPY;  Service: Endoscopy;;   CHOLECYSTECTOMY     COLONOSCOPY     ESOPHAGOGASTRODUODENOSCOPY (EGD) WITH PROPOFOL N/A 08/16/2020   Procedure: ESOPHAGOGASTRODUODENOSCOPY (EGD) WITH PROPOFOL;  Surgeon: Irving Copas., MD;  Location: [facility] ENDOSCOPY;  Service: Endoscopy;  Laterality: N/A;   EUS N/A 08/16/2020   Procedure: UPPER ENDOSCOPIC ULTRASOUND (EUS) RADIAL;  Surgeon: Rush Landmark Telford Nab., MD;  Location: [place];  Service: Endoscopy;  Laterality: N/A;   EYE SURGERY Bilateral    cataract with lens implant   FINE NEEDLE ASPIRATION  08/16/2020   Procedure: FINE NEEDLE ASPIRATION (FNA) LINEAR;  Surgeon: Irving Copas., MD;  Location: [place];  Service: Endoscopy;;   MASS EXCISION Left 10/06/2016   Procedure: EXCISION LEFT BUTTOCKS MASS;  Surgeon: Alphonsa Overall, MD;  Location: [place];  Service: General;  Laterality: Left;   PORT-A-CATH REMOVAL N/A 06/13/2021   Procedure: PORT REMOVAL;  Surgeon: [name], MD;  Location: [place];  Service: General;  Laterality: N/A;   PORTACATH PLACEMENT N/A 11/14/2020   Procedure: INSERTION PORT-A-CATH;  Surgeon: [name], MD;  Location: [place];  Service: General;  Laterality: N/A;   XI ROBOTIC ASSISTED LAPAROSCOPIC DISTAL PANCREATECTOMY N/A 10/17/2020   Procedure: XI ROBOTIC ASSISTED DISTAL PANCREATECTOMY AND SPLENECTOMY;  Surgeon: [name], MD;  Location: [place];  Service: General;  Laterality: N/A;    Family History  Problem Relation Age of Onset   Heart attack Father    Hypertension Brother    Heart disease Mother    Cervical cancer Mother        dx  unknown age   Skin cancer Mother        dx unknown age   Breast cancer Maternal Aunt        dx after 55   Cancer Maternal Uncle        ? colon; dx after 23   Leukemia Brother        d. >50   Skin cancer Daughter        basal cell carcinoma   Colon cancer Neg Hx    Stomach cancer Neg Hx     Social History   Socioeconomic History   Marital status: Married    Spouse name: Not on file   Number of children: 5   Years of education: Not on file   Highest education level: Not on file  Occupational History   Occupation: house wife    Employer: UNEMPLOYED  Tobacco Use   Smoking status: Former    Types: Cigarettes    Quit date: 07/10/2013    Years since quitting: 8.6   Smokeless tobacco: Current    Types: Snuff  Vaping Use   Vaping Use: Never used  Substance and Sexual Activity   Alcohol use: No    Alcohol/week: 0.0 standard drinks of alcohol   Drug use: No   Sexual activity: Not Currently    Birth control/protection: Post-menopausal  Other Topics Concern   Not on file  Social History Narrative   Not on file   Social Determinants of Health   Financial Resource Strain: Low Risk  (12/16/2021)   Overall Financial Resource Strain (CARDIA)    Difficulty of Paying Living Expenses: Not hard at all  Food Insecurity: No Food Insecurity (12/16/2021)   Hunger Vital Sign    Worried About Running Out of Food in the Last Year: Never true    [place] in the Last Year: Never true  Transportation Needs: No Transportation Needs (12/16/2021)   PRAPARE - Hydrologist (Medical): No    Lack of Transportation (Non-Medical): No  Physical Activity: Inactive (12/16/2021)   Exercise Vital Sign    Days of Exercise per Week: 0 days    Minutes of Exercise per Session: 0 min  Stress: No Stress Concern Present (12/16/2021)   [place]    Feeling of Stress : Not at all  Social Connections: [place] (12/16/2021)   Social Connection and Isolation Panel [NHANES]    Frequency of Communication with Friends and Family: More than three times a week    Frequency of Social Gatherings with Friends and Family: More than three times a week    Attends Religious Services: More than 4 times per year    Active Member of [employer] or Organizations: Yes    Attends [employer]  or Organization Meetings: More than 4 times per year    Marital Status: Married  Human resources officer Violence: Not At Risk (12/16/2021)   Humiliation, Afraid, Rape, and Kick questionnaire    Fear of Current or Ex-Partner: No    Emotionally Abused: No    Physically Abused: No    Sexually Abused: No    Outpatient Medications Prior to Visit  Medication Sig Dispense Refill   ALPRAZolam (XANAX) 0.5 MG tablet Take 1 tablet (0.5 mg total) by mouth 2 (two) times daily as needed for anxiety. TAKE 1 TABLET(0.5 MG) BY MOUTH TWICE DAILY AS NEEDED FOR ANXIETY 60 tablet 2   Cholecalciferol (VITAMIN D3) 50 MCG (2000 UT) capsule Take 1 capsule (2,000 Units total) by mouth daily. 100 capsule 3   Cyanocobalamin (VITAMIN B-12) 1000 MCG SUBL Place 1 tablet (1,000 mcg total) under the tongue daily. 100 tablet 3   HYDROcodone-acetaminophen (NORCO) 7.5-325 MG tablet Take 1 tablet by mouth every 8 (eight) hours as needed for severe pain. 60 tablet 0   ibuprofen (ADVIL) 200 MG tablet Take 200 mg by mouth every 6 (six) hours as needed for mild pain or moderate pain.     rosuvastatin (CRESTOR) 10 MG tablet Take 1 tablet (10 mg total) by mouth daily. 90 tablet 3   No facility-administered medications prior to visit.    Allergies  Allergen Reactions   Morphine And Related Shortness Of Breath   Bupropion Hcl Other (See Comments)    "does not feel right"   Ezetimibe Other (See Comments)    REACTION: legs burning   Metformin And Related Diarrhea    Diarrhea w/XR or regular    Naproxen     Upset stomach, diarrhea   Trazodone Hcl Swelling   Tramadol Hcl  Palpitations    REACTION: palpitations    ROS Pertinent positives and negatives in the history of present illness.     Objective:    Physical Exam Constitutional:      General: She is not in acute distress.    Appearance: She is not ill-appearing.  HENT:     Right Ear: There is impacted cerumen.     Left Ear: Tympanic membrane and ear canal normal.     Mouth/Throat:     Mouth: Mucous membranes are moist.  Eyes:     Conjunctiva/sclera: Conjunctivae normal.     Pupils: Pupils are equal, round, and reactive to light.  Cardiovascular:     Rate and Rhythm: Normal rate and regular rhythm.  Pulmonary:     Effort: Pulmonary effort is normal.     Breath sounds: Normal breath sounds.  Musculoskeletal:     Cervical back: Normal, normal range of motion and neck supple. No tenderness.     Thoracic back: Normal.     Lumbar back: Normal.       Back:     Comments: TTP to right upper trapezius. Pain to area with neck flexion and certain movements. Normal neck ROM. Normal right shoulder exam.   Lymphadenopathy:     Cervical: No cervical adenopathy.  Skin:    General: Skin is warm and dry.  Neurological:     General: No focal deficit present.     Mental Status: She is alert and oriented to person, place, and time.     Cranial Nerves: No cranial nerve deficit.     Sensory: No sensory deficit.     Motor: No weakness.     Coordination: Coordination normal.  Psychiatric:  Mood and Affect: Mood normal.        Behavior: Behavior normal.        Thought Content: Thought content normal.     BP 126/70 (BP Location: Left Arm, Patient Position: Sitting, Cuff Size: Large)   Pulse 64   Temp 97.8 F (36.6 C) (Temporal)   Ht '5\' 2"'$  (1.575 m)   Wt 174 lb (78.9 kg)   LMP  (LMP Unknown)   SpO2 97%   BMI 31.83 kg/m  Wt Readings from Last 3 Encounters:  02/18/22 174 lb (78.9 kg)  01/07/22 170 lb (77.1 kg)  12/20/21 170 lb 9.6 oz (77.4 kg)       Assessment & Plan:   Problem List  Items Addressed This Visit       Nervous and Auditory   Hearing loss of right ear due to cerumen impaction - Primary    Cerumen impaction. Verbal consent obtained for ear lavage on right. Ear lavage performed by CMA. Post lavage, hearing improved and she noted significant improvement. Normal appearing TM post lavage.         Other   Upper back pain on right side    MSK etiology. Chest X ray ordered. Discussed conservative treatment. Offered referral to PT and she declines for now.       Relevant Orders   DG Chest 2 View (Completed)    I am having Hassan Rowan K. Kitchen maintain her ibuprofen, rosuvastatin, ALPRAZolam, Vitamin D3, Vitamin B-12, and HYDROcodone-acetaminophen.  No orders of the defined types were placed in this encounter.

## 2022-02-18 NOTE — Assessment & Plan Note (Signed)
MSK etiology. Chest X ray ordered. Discussed conservative treatment. Offered referral to PT and she declines for now.

## 2022-02-18 NOTE — Patient Instructions (Signed)
Please go to the first floor for a chest X ray before you leave today.   Continue taking ibuprofen 200 mg as needed for pain. I also recommend using a topical pain medicine such as Salon Pas with lidocaine, Biofreeze, etc. You can also try a heating pad or ice pack.   If you are not improving, I will refer you physical therapist.

## 2022-02-18 NOTE — Progress Notes (Signed)
Her chest X ray is negative.

## 2022-02-18 NOTE — Assessment & Plan Note (Signed)
Cerumen impaction. Verbal consent obtained for ear lavage on right. Ear lavage performed by CMA. Post lavage, hearing improved and she noted significant improvement. Normal appearing TM post lavage.

## 2022-02-20 ENCOUNTER — Encounter: Payer: Self-pay | Admitting: Oncology

## 2022-02-21 ENCOUNTER — Ambulatory Visit: Payer: Medicare Other

## 2022-03-07 ENCOUNTER — Ambulatory Visit
Admission: RE | Admit: 2022-03-07 | Discharge: 2022-03-07 | Disposition: A | Discharge status: Home / Self Care | Payer: Medicare Other | Source: Ambulatory Visit | Attending: General Surgery | Admitting: General Surgery

## 2022-03-07 DIAGNOSIS — Z1231 Encounter for screening mammogram for malignant neoplasm of breast: Secondary | ICD-10-CM | POA: Diagnosis not present

## 2022-03-19 ENCOUNTER — Ambulatory Visit (INDEPENDENT_AMBULATORY_CARE_PROVIDER_SITE_OTHER): Payer: Medicare Other

## 2022-03-19 DIAGNOSIS — E538 Deficiency of other specified B group vitamins: Secondary | ICD-10-CM | POA: Diagnosis not present

## 2022-03-19 MED ORDER — CYANOCOBALAMIN 1000 MCG/ML IJ SOLN
1000.0000 ug | Freq: Once | INTRAMUSCULAR | Status: AC
  Administered 2022-03-19: 1000 ug via INTRAMUSCULAR

## 2022-03-19 NOTE — Progress Notes (Addendum)
After obtaining consent, and per orders of Dr. Alain Marion, injection of B12 given on the right deltoid by Marrian Salvage. Patient instructed to report any adverse reaction to me immediately.   Medical screening examination/treatment/procedure(s) were performed by non-physician practitioner and as supervising physician I was immediately available for consultation/collaboration.  I agree with above. Lew Dawes, MD

## 2022-04-09 ENCOUNTER — Encounter: Payer: Self-pay | Admitting: Internal Medicine

## 2022-04-09 ENCOUNTER — Ambulatory Visit (INDEPENDENT_AMBULATORY_CARE_PROVIDER_SITE_OTHER): Payer: Medicare Other | Admitting: Internal Medicine

## 2022-04-09 VITALS — BP 100/52 | HR 83 | Temp 98.4°F | Ht 62.0 in | Wt 176.2 lb

## 2022-04-09 DIAGNOSIS — F32A Anxiety disorder, unspecified: Secondary | ICD-10-CM

## 2022-04-09 DIAGNOSIS — G8929 Other chronic pain: Secondary | ICD-10-CM | POA: Diagnosis not present

## 2022-04-09 DIAGNOSIS — F419 Anxiety disorder, unspecified: Secondary | ICD-10-CM

## 2022-04-09 DIAGNOSIS — I10 Essential (primary) hypertension: Secondary | ICD-10-CM | POA: Diagnosis not present

## 2022-04-09 DIAGNOSIS — R1031 Right lower quadrant pain: Secondary | ICD-10-CM

## 2022-04-09 DIAGNOSIS — E119 Type 2 diabetes mellitus without complications: Secondary | ICD-10-CM

## 2022-04-09 DIAGNOSIS — M5440 Lumbago with sciatica, unspecified side: Secondary | ICD-10-CM

## 2022-04-09 DIAGNOSIS — E538 Deficiency of other specified B group vitamins: Secondary | ICD-10-CM

## 2022-04-09 DIAGNOSIS — C252 Malignant neoplasm of tail of pancreas: Secondary | ICD-10-CM

## 2022-04-09 DIAGNOSIS — R7309 Other abnormal glucose: Secondary | ICD-10-CM | POA: Diagnosis not present

## 2022-04-09 LAB — CBC WITH DIFFERENTIAL/PLATELET
Basophils Absolute: 0.1 10*3/uL (ref 0.0–0.1)
Basophils Relative: 0.8 % (ref 0.0–3.0)
Eosinophils Absolute: 0.1 10*3/uL (ref 0.0–0.7)
Eosinophils Relative: 1.2 % (ref 0.0–5.0)
HCT: 41.2 % (ref 36.0–46.0)
Hemoglobin: 13.6 g/dL (ref 12.0–15.0)
Lymphocytes Relative: 36.7 % (ref 12.0–46.0)
Lymphs Abs: 3.2 10*3/uL (ref 0.7–4.0)
MCHC: 33 g/dL (ref 30.0–36.0)
MCV: 96.7 fl (ref 78.0–100.0)
Monocytes Absolute: 0.7 10*3/uL (ref 0.1–1.0)
Monocytes Relative: 8.3 % (ref 3.0–12.0)
Neutro Abs: 4.6 10*3/uL (ref 1.4–7.7)
Neutrophils Relative %: 53 % (ref 43.0–77.0)
Platelets: 293 10*3/uL (ref 150.0–400.0)
RBC: 4.26 Mil/uL (ref 3.87–5.11)
RDW: 13.6 % (ref 11.5–15.5)
WBC: 8.6 10*3/uL (ref 4.0–10.5)

## 2022-04-09 LAB — COMPREHENSIVE METABOLIC PANEL
ALT: 13 U/L (ref 0–35)
AST: 18 U/L (ref 0–37)
Albumin: 4.1 g/dL (ref 3.5–5.2)
Alkaline Phosphatase: 85 U/L (ref 39–117)
BUN: 18 mg/dL (ref 6–23)
CO2: 28 mEq/L (ref 19–32)
Calcium: 9.4 mg/dL (ref 8.4–10.5)
Chloride: 102 mEq/L (ref 96–112)
Creatinine, Ser: 0.73 mg/dL (ref 0.40–1.20)
GFR: 81.28 mL/min (ref >60.00)
Glucose, Bld: 248 mg/dL — ABNORMAL HIGH (ref 70–99)
Potassium: 3.9 mEq/L (ref 3.5–5.1)
Sodium: 136 mEq/L (ref 135–145)
Total Bilirubin: 0.7 mg/dL (ref 0.2–1.2)
Total Protein: 6.9 g/dL (ref 6.0–8.3)

## 2022-04-09 LAB — HEMOGLOBIN A1C: Hgb A1c MFr Bld: 7.1 % — ABNORMAL HIGH (ref 4.6–6.5)

## 2022-04-09 LAB — VITAMIN B12: Vitamin B-12: 658 pg/mL (ref 211–911)

## 2022-04-09 MED ORDER — HYDROCODONE-ACETAMINOPHEN 7.5-325 MG PO TABS: 1.0000 | ORAL_TABLET | Freq: Three times a day (TID) | ORAL | 0 refills | Status: DC | PRN

## 2022-04-09 MED ORDER — ALPRAZOLAM 0.5 MG PO TABS: 0.5000 mg | ORAL_TABLET | Freq: Two times a day (BID) | ORAL | 2 refills | Status: DC | PRN

## 2022-04-09 NOTE — Assessment & Plan Note (Signed)
Norco prn  Potential benefits of a long term opioids use as well as potential risks (i.e. addiction risk, apnea etc) and complications (i.e. Somnolence, constipation and others) were explained to the patient and were aknowledged.  Potential benefits of a long term NSAIDs  use as well as potential risks  and complications were explained to the patient and were aknowledged. 

## 2022-04-09 NOTE — Assessment & Plan Note (Signed)
Chronic Cont on Xanax prn  Potential benefits of a long term benzodiazepines  use as well as potential risks  and complications were explained to the patient and were aknowledged.

## 2022-04-09 NOTE — Assessment & Plan Note (Signed)
F/u w/Dr Benay Spice next month

## 2022-04-09 NOTE — Assessment & Plan Note (Signed)
NAS diet 

## 2022-04-09 NOTE — Progress Notes (Signed)
Subjective:  Patient ID: Amanda Arnold, female    DOB: 1946/01/12  Age: 76 y.o. MRN: [id number]  CC: Follow-up (3 month f/u)   HPI Amanda Arnold presents for B12 def, anxiety, LBP F/u on cancer Fatigue is better  Outpatient Medications Prior to Visit  Medication Sig Dispense Refill   Cholecalciferol (VITAMIN D3) 50 MCG (2000 UT) capsule Take 1 capsule (2,000 Units total) by mouth daily. 100 capsule 3   Cyanocobalamin (VITAMIN B-12) 1000 MCG SUBL Place 1 tablet (1,000 mcg total) under the tongue daily. 100 tablet 3   ibuprofen (ADVIL) 200 MG tablet Take 200 mg by mouth every 6 (six) hours as needed for mild pain or moderate pain.     rosuvastatin (CRESTOR) 10 MG tablet Take 1 tablet (10 mg total) by mouth daily. 90 tablet 3   ALPRAZolam (XANAX) 0.5 MG tablet Take 1 tablet (0.5 mg total) by mouth 2 (two) times daily as needed for anxiety. TAKE 1 TABLET(0.5 MG) BY MOUTH TWICE DAILY AS NEEDED FOR ANXIETY 60 tablet 2   HYDROcodone-acetaminophen (NORCO) 7.5-325 MG tablet Take 1 tablet by mouth every 8 (eight) hours as needed for severe pain. 60 tablet 0   No facility-administered medications prior to visit.    ROS: Review of Systems  Constitutional:  Positive for fatigue. Negative for activity change, appetite change, chills and unexpected weight change.  HENT:  Negative for congestion, mouth sores and sinus pressure.   Eyes:  Negative for visual disturbance.  Respiratory:  Negative for cough and chest tightness.   Gastrointestinal:  Negative for abdominal pain and nausea.  Genitourinary:  Negative for difficulty urinating, frequency and vaginal pain.  Musculoskeletal:  Positive for back pain. Negative for gait problem.  Skin:  Negative for pallor and rash.  Neurological:  Negative for dizziness, tremors, weakness, numbness and headaches.  Psychiatric/Behavioral:  Negative for confusion, sleep disturbance and suicidal ideas. The patient is nervous/anxious.     Objective:  BP  (!) 100/52 (BP Location: Left Arm)   Pulse 83   Temp 98.4 F (36.9 C) (Oral)   Ht '5\' 2"'$  (1.575 m)   Wt 176 lb 3.2 oz (79.9 kg)   LMP  (LMP Unknown)   SpO2 96%   BMI 32.23 kg/m   BP Readings from Last 3 Encounters:  04/09/22 (!) 100/52  02/18/22 126/70  01/07/22 120/60    Wt Readings from Last 3 Encounters:  04/09/22 176 lb 3.2 oz (79.9 kg)  02/18/22 174 lb (78.9 kg)  01/07/22 170 lb (77.1 kg)    Physical Exam Constitutional:      General: She is not in acute distress.    Appearance: She is well-developed. She is obese.  HENT:     Head: Normocephalic.     Right Ear: External ear normal.     Left Ear: External ear normal.     Nose: Nose normal.  Eyes:     General:        Right eye: No discharge.        Left eye: No discharge.     Conjunctiva/sclera: Conjunctivae normal.     Pupils: Pupils are equal, round, and reactive to light.  Neck:     Thyroid: No thyromegaly.     Vascular: No JVD.     Trachea: No tracheal deviation.  Cardiovascular:     Rate and Rhythm: Normal rate and regular rhythm.     Heart sounds: Normal heart sounds.  Pulmonary:     Effort: No  respiratory distress.     Breath sounds: No stridor. No wheezing.  Abdominal:     General: Bowel sounds are normal. There is no distension.     Palpations: Abdomen is soft. There is no mass.     Tenderness: There is no abdominal tenderness. There is no guarding or rebound.  Musculoskeletal:        General: No tenderness.     Cervical back: Normal range of motion and neck supple. No rigidity.  Lymphadenopathy:     Cervical: No cervical adenopathy.  Skin:    Findings: No erythema or rash.  Neurological:     Mental Status: She is oriented to person, place, and time.     Cranial Nerves: No cranial nerve deficit.     Motor: No abnormal muscle tone.     Coordination: Coordination normal.     Deep Tendon Reflexes: Reflexes normal.  Psychiatric:        Behavior: Behavior normal.        Thought Content: Thought  content normal.        Judgment: Judgment normal.   LS w/pain  Lab Results  Component Value Date   WBC 7.5 10/07/2021   HGB 13.6 10/07/2021   HCT 41.2 10/07/2021   PLT 290.0 10/07/2021   GLUCOSE 136 (H) 01/07/2022   CHOL 167 09/17/2017   TRIG 92.0 09/17/2017   HDL 53.00 09/17/2017   LDLDIRECT 56 06/25/2021   LDLCALC 96 09/17/2017   ALT 19 01/07/2022   AST 27 01/07/2022   NA 140 01/07/2022   K 4.0 01/07/2022   CL 104 01/07/2022   CREATININE 0.65 01/07/2022   BUN 17 01/07/2022   CO2 29 01/07/2022   TSH 1.06 10/07/2021   INR 1.1 (H) 08/22/2020   HGBA1C 6.9 (H) 01/07/2022    MM 3D SCREEN BREAST BILATERAL  Result Date: 03/10/2022 CLINICAL DATA:  Screening. EXAM: DIGITAL SCREENING BILATERAL MAMMOGRAM WITH TOMOSYNTHESIS AND CAD TECHNIQUE: Bilateral screening digital craniocaudal and mediolateral oblique mammograms were obtained. Bilateral screening digital breast tomosynthesis was performed. The images were evaluated with computer-aided detection. COMPARISON:  Previous exam(s). ACR Breast Density Category b: There are scattered areas of fibroglandular density. FINDINGS: There are no findings suspicious for malignancy. IMPRESSION: No mammographic evidence of malignancy. A result letter of this screening mammogram will be mailed directly to the patient. RECOMMENDATION: Screening mammogram in one year. (Code:SM-B-01Y) BI-RADS CATEGORY  1: Negative. Electronically Signed   By: Abelardo Diesel M.D.   On: 03/10/2022 16:53    Assessment & Plan:   Problem List Items Addressed This Visit     Anxiety and depression    Chronic Cont on Xanax prn  Potential benefits of a long term benzodiazepines  use as well as potential risks  and complications were explained to the patient and were aknowledged.      Relevant Medications   ALPRAZolam (XANAX) 0.5 MG tablet   Cancer of pancreas, tail (HCC)    F/u w/Dr Benay Spice next month      Relevant Medications   ALPRAZolam (XANAX) 0.5 MG tablet    Other Relevant Orders   CBC with Differential/Platelet   Comprehensive metabolic panel   Vitamin Y69   Hemoglobin A1c   Essential hypertension    NAS diet      Relevant Orders   CBC with Differential/Platelet   Comprehensive metabolic panel   Vitamin S85   HYPERGLYCEMIA - Primary   Relevant Orders   Comprehensive metabolic panel   Hemoglobin A1c   LOW  BACK PAIN    Norco prn  Potential benefits of a long term opioids use as well as potential risks (i.e. addiction risk, apnea etc) and complications (i.e. Somnolence, constipation and others) were explained to the patient and were aknowledged.  Potential benefits of a long term NSAIDs  use as well as potential risks  and complications were explained to the patient and were aknowledged.      Relevant Medications   HYDROcodone-acetaminophen (NORCO) 7.5-325 MG tablet   Other Relevant Orders   CBC with Differential/Platelet   Comprehensive metabolic panel   Hemoglobin A1c   Low vitamin B12 level    On B12 sq Check B12      Right lower quadrant pain   Relevant Medications   ALPRAZolam (XANAX) 0.5 MG tablet   Other Visit Diagnoses     Controlled type 2 diabetes mellitus without complication, without long-term current use of insulin (HCC)       Relevant Medications   ALPRAZolam (XANAX) 0.5 MG tablet         Meds ordered this encounter  Medications   HYDROcodone-acetaminophen (NORCO) 7.5-325 MG tablet    Sig: Take 1 tablet by mouth every 8 (eight) hours as needed for severe pain.    Dispense:  60 tablet    Refill:  0    M54.30   ALPRAZolam (XANAX) 0.5 MG tablet    Sig: Take 1 tablet (0.5 mg total) by mouth 2 (two) times daily as needed for anxiety. TAKE 1 TABLET(0.5 MG) BY MOUTH TWICE DAILY AS NEEDED FOR ANXIETY    Dispense:  60 tablet    Refill:  2      Follow-up: Return in about 3 months (around 07/09/2022) for a follow-up visit.  Walker Kehr, MD

## 2022-04-09 NOTE — Assessment & Plan Note (Signed)
On B12 sq Check B12

## 2022-04-10 ENCOUNTER — Telehealth: Payer: Self-pay

## 2022-04-10 NOTE — Telephone Encounter (Signed)
Called pt gave her MD response on labs.Marland KitchenJohny Chess

## 2022-04-10 NOTE — Telephone Encounter (Signed)
Patient missed a call about lab results.

## 2022-04-18 ENCOUNTER — Ambulatory Visit: Payer: Medicare Other

## 2022-04-25 ENCOUNTER — Inpatient Hospital Stay: Payer: Medicare Other | Admitting: Oncology

## 2022-04-25 ENCOUNTER — Inpatient Hospital Stay: Payer: Medicare Other | Attending: Oncology

## 2022-04-25 VITALS — BP 112/64 | HR 77 | Temp 98.4°F | Resp 18 | Wt 172.2 lb

## 2022-04-25 DIAGNOSIS — I10 Essential (primary) hypertension: Secondary | ICD-10-CM | POA: Insufficient documentation

## 2022-04-25 DIAGNOSIS — Z9081 Acquired absence of spleen: Secondary | ICD-10-CM | POA: Diagnosis not present

## 2022-04-25 DIAGNOSIS — J449 Chronic obstructive pulmonary disease, unspecified: Secondary | ICD-10-CM | POA: Insufficient documentation

## 2022-04-25 DIAGNOSIS — C252 Malignant neoplasm of tail of pancreas: Secondary | ICD-10-CM | POA: Insufficient documentation

## 2022-04-25 NOTE — Progress Notes (Signed)
[place] OFFICE PROGRESS NOTE   Diagnosis: Pancreas cancer  INTERVAL HISTORY:   Amanda Arnold returns as scheduled.  She feels well.  Good appetite and energy level.  She has occasional mild discomfort in the right lower abdomen.  No consistent pain.    Objective:  Vital signs in last 24 hours:  Blood pressure 112/64, pulse 77, temperature 98.4 F (36.9 C), temperature source Oral, resp. rate 18, weight 172 lb 3.2 oz (78.1 kg), SpO2 96 %.    Lymphatics: No cervical, supraclavicular, axillary, or inguinal nodes Resp: Lungs clear bilaterally Cardio: Regular rate and rhythm GI: No hepatosplenomegaly, no mass, mild tenderness at the bilateral anterior iliac and right lower abdomen. Vascular: No leg edema    Lab Results:  Lab Results  Component Value Date   WBC 8.6 04/09/2022   HGB 13.6 04/09/2022   HCT 41.2 04/09/2022   MCV 96.7 04/09/2022   PLT 293.0 04/09/2022   NEUTROABS 4.6 04/09/2022    CMP  Lab Results  Component Value Date   NA 136 04/09/2022   K 3.9 04/09/2022   CL 102 04/09/2022   CO2 28 04/09/2022   GLUCOSE 248 (H) 04/09/2022   BUN 18 04/09/2022   CREATININE 0.73 04/09/2022   CALCIUM 9.4 04/09/2022   PROT 6.9 04/09/2022   ALBUMIN 4.1 04/09/2022   AST 18 04/09/2022   ALT 13 04/09/2022   ALKPHOS 85 04/09/2022   BILITOT 0.7 04/09/2022   GFRNONAA >60 05/22/2021   GFRAA 103 04/20/2020    Lab Results  Component Value Date   [id number] 11 12/20/2021    Medications: I have reviewed the patient's current medications.   Assessment/Plan:  Pancreas cancer-stage IIb (pT2pN1) CT abdomen/pelvis 06/01/2020-2.9 x 3 cm pancreas tail mass, slight increase in size of 7 mm right middle lobe nodule MRI/MRCP 06/22/2020-solid 3.8 x 3 x 3.1 cm pancreas tail mass with focal occlusion of the splenic vein and dilated venous collaterals, no evidence of metastatic disease, bilateral adrenal adenomas CT renal stone study 08/02/2020-scattered airspace opacities  throughout the lung bases-new favored to represent infectious/inflammatory changes EUS 08/16/2020-pancreas tail mass, T2N0, FNA biopsy-suspicious for malignancy CTs 08/27/2020-unchanged right middle lobe nodule, superficial left upper back lesion, no change in pancreas tail mass, occluded splenic vein with collaterals at the splenic hilum, no enlarged abdominal pelvic lymph nodes Distal pancreatectomy/splenectomy 10/17/2020- 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  2/15 lymph nodes positive, positive lymphovascular and perineural invasion, resection margins negative, pT2pN1 Cycle 1 gemcitabine/capecitabine 11/21/2020, day 15 gemcitabine 12/05/2020 Cycle 2 gemcitabine/capecitabine 12/20/2020, day 15 gemcitabine 01/03/2021 Cycle 3 gemcitabine/capecitabine 01/16/2021, day 15 gemcitabine 01/30/2021 Cycle 4 gemcitabine/capecitabine 02/13/2021, day 15 gemcitabine 02/27/2021 Cycle 5 gemcitabine/capecitabine 03/13/2021 (capecitabine dose reduced secondary to hand/foot syndrome), day 15 gemcitabine 03/27/2021 Cycle 6 gemcitabine/capecitabine 04/10/2021, day 15 gemcitabine 04/24/2021 CT abdomen/pelvis 07/26/2021-slight increase in size of a cystic lesion at the distal pancreatic tail/stump.  Additional fluid collections at the surgical bed decreased in size.   2.   Gastric ulcer on EUS 08/16/2020-H. pylori positive 3.   COVID-19 infection January 2022 4.   COPD 5.   Hypertension 6.   Right abdomen/right leg pain-musculoskeletal? 7.   Right middle lobe nodule on CT 06/01/2020, increased from 2017, stable on CT 11/13/2020, stable on CT 04/23/2021 8.   History of tobacco use 9.   Superficial left upper back lesion noted on CT 08/27/2020       Disposition: Amanda Arnold is in clinical remission  from pancreas cancer.  We will follow-up on the CA 19-9 from today.  She will return for an office visit in 4 months.  Betsy Coder,  MD  04/25/2022  9:47 AM

## 2022-04-25 NOTE — Progress Notes (Deleted)
  [place] OFFICE PROGRESS NOTE   Diagnosis:   INTERVAL HISTORY:   ***  Objective:  Vital signs in last 24 hours:  There were no vitals taken for this visit.    HEENT: *** Lymphatics: *** Resp: *** Cardio: *** GI: *** Vascular: *** Neuro:***  Skin:***   Portacath/PICC-without erythema  Lab Results:  Lab Results  Component Value Date   WBC 8.6 04/09/2022   HGB 13.6 04/09/2022   HCT 41.2 04/09/2022   MCV 96.7 04/09/2022   PLT 293.0 04/09/2022   NEUTROABS 4.6 04/09/2022    CMP  Lab Results  Component Value Date   NA 136 04/09/2022   K 3.9 04/09/2022   CL 102 04/09/2022   CO2 28 04/09/2022   GLUCOSE 248 (H) 04/09/2022   BUN 18 04/09/2022   CREATININE 0.73 04/09/2022   CALCIUM 9.4 04/09/2022   PROT 6.9 04/09/2022   ALBUMIN 4.1 04/09/2022   AST 18 04/09/2022   ALT 13 04/09/2022   ALKPHOS 85 04/09/2022   BILITOT 0.7 04/09/2022   GFRNONAA >60 05/22/2021   GFRAA 103 04/20/2020    Lab Results  Component Value Date   [id number] 11 12/20/2021      Medications: I have reviewed the patient's current medications.   Assessment/Plan: Pancreas cancer-stage IIb (pT2pN1) CT abdomen/pelvis 06/01/2020-2.9 x 3 cm pancreas tail mass, slight increase in size of 7 mm right middle lobe nodule MRI/MRCP 06/22/2020-solid 3.8 x 3 x 3.1 cm pancreas tail mass with focal occlusion of the splenic vein and dilated venous collaterals, no evidence of metastatic disease, bilateral adrenal adenomas CT renal stone study 08/02/2020-scattered airspace opacities throughout the lung bases-new favored to represent infectious/inflammatory changes EUS 08/16/2020-pancreas tail mass, T2N0, FNA biopsy-suspicious for malignancy CTs 08/27/2020-unchanged right middle lobe nodule, superficial left upper back lesion, no change in pancreas tail mass, occluded splenic vein with collaterals at the splenic hilum, no enlarged abdominal pelvic lymph nodes Distal pancreatectomy/splenectomy  10/17/2020- 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  2/15 lymph nodes positive, positive lymphovascular and perineural invasion, resection margins negative, pT2pN1 Cycle 1 gemcitabine/capecitabine 11/21/2020, day 15 gemcitabine 12/05/2020 Cycle 2 gemcitabine/capecitabine 12/20/2020, day 15 gemcitabine 01/03/2021 Cycle 3 gemcitabine/capecitabine 01/16/2021, day 15 gemcitabine 01/30/2021 Cycle 4 gemcitabine/capecitabine 02/13/2021, day 15 gemcitabine 02/27/2021 Cycle 5 gemcitabine/capecitabine 03/13/2021 (capecitabine dose reduced secondary to hand/foot syndrome), day 15 gemcitabine 03/27/2021 Cycle 6 gemcitabine/capecitabine 04/10/2021, day 15 gemcitabine 04/24/2021 CT abdomen/pelvis 07/26/2021-slight increase in size of a cystic lesion at the distal pancreatic tail/stump.  Additional fluid collections at the surgical bed decreased in size.   2.   Gastric ulcer on EUS 08/16/2020-H. pylori positive 3.   COVID-19 infection January 2022 4.   COPD 5.   Hypertension 6.   Right abdomen/right leg pain-musculoskeletal? 7.   Right middle lobe nodule on CT 06/01/2020, increased from 2017, stable on CT 11/13/2020, stable on CT 04/23/2021 8.   History of tobacco use 9.   Superficial left upper back lesion noted on CT 08/27/2020       Disposition: ***  Betsy Coder, MD  04/25/2022  7:41 AM

## 2022-04-27 LAB — CANCER ANTIGEN 19-9: CA 19-9: 11 U/mL (ref 0–35)

## 2022-04-28 ENCOUNTER — Telehealth: Payer: Self-pay

## 2022-04-28 NOTE — Telephone Encounter (Signed)
-----   Message from Ladell Pier, MD sent at 04/27/2022  5:44 PM EDT ----- Please call patient, ca19-9 is normal

## 2022-04-28 NOTE — Telephone Encounter (Signed)
Patient gave verbal understanding had no further questions or concerns at this time 

## 2022-06-02 ENCOUNTER — Telehealth: Payer: Medicare Other

## 2022-07-09 ENCOUNTER — Ambulatory Visit: Payer: Medicare Other | Admitting: Internal Medicine

## 2022-07-10 ENCOUNTER — Ambulatory Visit (INDEPENDENT_AMBULATORY_CARE_PROVIDER_SITE_OTHER): Payer: Medicare Other | Admitting: Internal Medicine

## 2022-07-10 ENCOUNTER — Encounter: Payer: Self-pay | Admitting: Internal Medicine

## 2022-07-10 VITALS — BP 118/64 | HR 85 | Temp 98.2°F | Ht 62.0 in | Wt 173.0 lb

## 2022-07-10 DIAGNOSIS — G8929 Other chronic pain: Secondary | ICD-10-CM | POA: Diagnosis not present

## 2022-07-10 DIAGNOSIS — E119 Type 2 diabetes mellitus without complications: Secondary | ICD-10-CM | POA: Diagnosis not present

## 2022-07-10 DIAGNOSIS — R1031 Right lower quadrant pain: Secondary | ICD-10-CM | POA: Diagnosis not present

## 2022-07-10 DIAGNOSIS — F411 Generalized anxiety disorder: Secondary | ICD-10-CM

## 2022-07-10 DIAGNOSIS — R519 Headache, unspecified: Secondary | ICD-10-CM

## 2022-07-10 DIAGNOSIS — U071 COVID-19: Secondary | ICD-10-CM | POA: Diagnosis not present

## 2022-07-10 DIAGNOSIS — M5440 Lumbago with sciatica, unspecified side: Secondary | ICD-10-CM

## 2022-07-10 HISTORY — DX: COVID-19: U07.1

## 2022-07-10 LAB — POC COVID19 BINAXNOW: SARS Coronavirus 2 Ag: POSITIVE — AB

## 2022-07-10 MED ORDER — MOLNUPIRAVIR EUA 200MG CAPSULE: 4.0000 | ORAL_CAPSULE | Freq: Two times a day (BID) | ORAL | 0 refills | Status: AC

## 2022-07-10 MED ORDER — HYDROCODONE-ACETAMINOPHEN 7.5-325 MG PO TABS: 1.0000 | ORAL_TABLET | Freq: Three times a day (TID) | ORAL | 0 refills | Status: DC | PRN

## 2022-07-10 MED ORDER — PROMETHAZINE-DM 6.25-15 MG/5ML PO SYRP: 5.0000 mL | ORAL_SOLUTION | Freq: Four times a day (QID) | ORAL | 0 refills | Status: DC | PRN

## 2022-07-10 MED ORDER — ALPRAZOLAM 0.5 MG PO TABS: 0.5000 mg | ORAL_TABLET | Freq: Two times a day (BID) | ORAL | 2 refills | Status: DC | PRN

## 2022-07-10 NOTE — Patient Instructions (Addendum)
For a mild COVID-19 case - take zinc 50 mg a day for 1 week, vitamin C 1000 mg daily for 1 week, vitamin D2 50,000 units weekly for 2 months (unless  taking vitamin D daily already), an antioxidant Quercetin 500 mg twice a day for 1 week (if you can get it quick enough). Take Allegra or Benadryl.  Maintain good oral hydration and take Tylenol for high fever.  Call if problems. Isolate for 5 days, then wear a mask for 5 days per CDC.

## 2022-07-10 NOTE — Progress Notes (Signed)
Subjective:  Patient ID: Amanda Arnold, female    DOB: 02/08/1946  Age: 76 y.o. MRN: [id number]  CC: Pain (In legs and back , also has headache and fever started yesterday )   HPI CASSIDY TASHIRO presents for URI since 12/27. C/o HA and myalgias  Outpatient Medications Prior to Visit  Medication Sig Dispense Refill   Cholecalciferol (VITAMIN D3) 50 MCG (2000 UT) capsule Take 1 capsule (2,000 Units total) by mouth daily. 100 capsule 3   Cyanocobalamin (VITAMIN B-12) 1000 MCG SUBL Place 1 tablet (1,000 mcg total) under the tongue daily. 100 tablet 3   ibuprofen (ADVIL) 200 MG tablet Take 200 mg by mouth every 6 (six) hours as needed for mild pain or moderate pain.     rosuvastatin (CRESTOR) 10 MG tablet Take 1 tablet (10 mg total) by mouth daily. 90 tablet 3   ALPRAZolam (XANAX) 0.5 MG tablet Take 1 tablet (0.5 mg total) by mouth 2 (two) times daily as needed for anxiety. TAKE 1 TABLET(0.5 MG) BY MOUTH TWICE DAILY AS NEEDED FOR ANXIETY 60 tablet 2   HYDROcodone-acetaminophen (NORCO) 7.5-325 MG tablet Take 1 tablet by mouth every 8 (eight) hours as needed for severe pain. 60 tablet 0   No facility-administered medications prior to visit.    ROS: Review of Systems  Constitutional:  Positive for fatigue and fever. Negative for activity change, appetite change, chills, diaphoresis and unexpected weight change.  HENT:  Positive for congestion and sinus pressure. Negative for dental problem, ear pain, hearing loss, mouth sores, postnasal drip, sneezing, sore throat and voice change.   Eyes:  Negative for pain and visual disturbance.  Respiratory:  Negative for cough, chest tightness, wheezing and stridor.   Cardiovascular:  Negative for chest pain, palpitations and leg swelling.  Gastrointestinal:  Negative for abdominal distention, abdominal pain, blood in stool, nausea, rectal pain and vomiting.  Genitourinary:  Negative for decreased urine volume, difficulty urinating, dysuria,  frequency, hematuria, menstrual problem, vaginal bleeding, vaginal discharge and vaginal pain.  Musculoskeletal:  Positive for arthralgias and back pain. Negative for gait problem, joint swelling and neck pain.  Skin:  Negative for color change, rash and wound.  Neurological:  Positive for weakness and headaches. Negative for dizziness, tremors, syncope, speech difficulty and light-headedness.  Hematological:  Negative for adenopathy.  Psychiatric/Behavioral:  Negative for behavioral problems, confusion, decreased concentration, dysphoric mood, hallucinations, sleep disturbance and suicidal ideas. The patient is not nervous/anxious and is not hyperactive.     Objective:  BP 118/64 (BP Location: Left Arm, Patient Position: Sitting, Cuff Size: Normal)   Pulse 85   Temp 98.2 F (36.8 C) (Oral)   Ht '5\' 2"'$  (1.575 m)   Wt 173 lb (78.5 kg)   LMP  (LMP Unknown)   SpO2 97%   BMI 31.64 kg/m   BP Readings from Last 3 Encounters:  07/10/22 118/64  04/25/22 112/64  04/09/22 (!) 100/52    Wt Readings from Last 3 Encounters:  07/10/22 173 lb (78.5 kg)  04/25/22 172 lb 3.2 oz (78.1 kg)  04/09/22 176 lb 3.2 oz (79.9 kg)    Physical Exam Constitutional:      General: She is not in acute distress.    Appearance: She is well-developed. She is obese.  HENT:     Head: Normocephalic.     Right Ear: External ear normal.     Left Ear: External ear normal.     Nose: Nose normal.  Eyes:  General:        Right eye: No discharge.        Left eye: No discharge.     Conjunctiva/sclera: Conjunctivae normal.     Pupils: Pupils are equal, round, and reactive to light.  Neck:     Thyroid: No thyromegaly.     Vascular: No JVD.     Trachea: No tracheal deviation.  Cardiovascular:     Rate and Rhythm: Normal rate and regular rhythm.     Heart sounds: Normal heart sounds.  Pulmonary:     Effort: No respiratory distress.     Breath sounds: No stridor. No wheezing.  Abdominal:     General: Bowel  sounds are normal. There is no distension.     Palpations: Abdomen is soft. There is no mass.     Tenderness: There is no abdominal tenderness. There is no guarding or rebound.  Musculoskeletal:        General: No tenderness.     Cervical back: Normal range of motion and neck supple. No rigidity.  Lymphadenopathy:     Cervical: No cervical adenopathy.  Skin:    Findings: No erythema or rash.  Neurological:     Cranial Nerves: No cranial nerve deficit.     Motor: No abnormal muscle tone.     Coordination: Coordination normal.     Deep Tendon Reflexes: Reflexes normal.  Psychiatric:        Behavior: Behavior normal.        Thought Content: Thought content normal.        Judgment: Judgment normal.   Eryth throat Antalgic gait  Lab Results  Component Value Date   WBC 8.6 04/09/2022   HGB 13.6 04/09/2022   HCT 41.2 04/09/2022   PLT 293.0 04/09/2022   GLUCOSE 248 (H) 04/09/2022   CHOL 167 09/17/2017   TRIG 92.0 09/17/2017   HDL 53.00 09/17/2017   LDLDIRECT 56 06/25/2021   LDLCALC 96 09/17/2017   ALT 13 04/09/2022   AST 18 04/09/2022   NA 136 04/09/2022   K 3.9 04/09/2022   CL 102 04/09/2022   CREATININE 0.73 04/09/2022   BUN 18 04/09/2022   CO2 28 04/09/2022   TSH 1.06 10/07/2021   INR 1.1 (H) 08/22/2020   HGBA1C 7.1 (H) 04/09/2022    MM 3D SCREEN BREAST BILATERAL  Result Date: 03/10/2022 CLINICAL DATA:  Screening. EXAM: DIGITAL SCREENING BILATERAL MAMMOGRAM WITH TOMOSYNTHESIS AND CAD TECHNIQUE: Bilateral screening digital craniocaudal and mediolateral oblique mammograms were obtained. Bilateral screening digital breast tomosynthesis was performed. The images were evaluated with computer-aided detection. COMPARISON:  Previous exam(s). ACR Breast Density Category b: There are scattered areas of fibroglandular density. FINDINGS: There are no findings suspicious for malignancy. IMPRESSION: No mammographic evidence of malignancy. A result letter of this screening mammogram  will be mailed directly to the patient. RECOMMENDATION: Screening mammogram in one year. (Code:SM-B-01Y) BI-RADS CATEGORY  1: Negative. Electronically Signed   By: Abelardo Diesel M.D.   On: 03/10/2022 16:53    Assessment & Plan:   Problem List Items Addressed This Visit     Right lower quadrant pain   Relevant Medications   ALPRAZolam (XANAX) 0.5 MG tablet   LOW BACK PAIN    Norco prn  Potential benefits of a long term opioids use as well as potential risks (i.e. addiction risk, apnea etc) and complications (i.e. Somnolence, constipation and others) were explained to the patient and were aknowledged.  Potential benefits of a long term NSAIDs  use as well as potential risks  and complications were explained to the patient and were aknowledged.      Relevant Medications   HYDROcodone-acetaminophen (NORCO) 7.5-325 MG tablet   Generalized anxiety disorder    Chronic Cont on PRN Xanax  Potential benefits of a long term benzodiazepines  use as well as potential risks  and complications were explained to the patient and were aknowledged.      Relevant Medications   ALPRAZolam (XANAX) 0.5 MG tablet   COVID-19    Acute Molnupiravir Rx      Relevant Medications   molnupiravir EUA (LAGEVRIO) 200 mg CAPS capsule   Other Visit Diagnoses     Acute nonintractable headache, unspecified headache type    -  Primary   Relevant Medications   HYDROcodone-acetaminophen (NORCO) 7.5-325 MG tablet   Other Relevant Orders   POC COVID-19 (Completed)   Controlled type 2 diabetes mellitus without complication, without long-term current use of insulin (HCC)       Relevant Medications   ALPRAZolam (XANAX) 0.5 MG tablet         Meds ordered this encounter  Medications   promethazine-dextromethorphan (PROMETHAZINE-DM) 6.25-15 MG/5ML syrup    Sig: Take 5 mLs by mouth 4 (four) times daily as needed for cough.    Dispense:  240 mL    Refill:  0   ALPRAZolam (XANAX) 0.5 MG tablet    Sig: Take 1  tablet (0.5 mg total) by mouth 2 (two) times daily as needed for anxiety. TAKE 1 TABLET(0.5 MG) BY MOUTH TWICE DAILY AS NEEDED FOR ANXIETY    Dispense:  60 tablet    Refill:  2   HYDROcodone-acetaminophen (NORCO) 7.5-325 MG tablet    Sig: Take 1 tablet by mouth every 8 (eight) hours as needed for severe pain.    Dispense:  60 tablet    Refill:  0    M54.30   molnupiravir EUA (LAGEVRIO) 200 mg CAPS capsule    Sig: Take 4 capsules (800 mg total) by mouth 2 (two) times daily for 5 days.    Dispense:  40 capsule    Refill:  0      Follow-up: No follow-ups on file.  Walker Kehr, MD

## 2022-07-10 NOTE — Assessment & Plan Note (Signed)
Acute Molnupiravir Rx

## 2022-07-10 NOTE — Assessment & Plan Note (Signed)
Chronic Cont on PRN Xanax  Potential benefits of a long term benzodiazepines  use as well as potential risks  and complications were explained to the patient and were aknowledged.

## 2022-07-10 NOTE — Assessment & Plan Note (Signed)
Norco prn  Potential benefits of a long term opioids use as well as potential risks (i.e. addiction risk, apnea etc) and complications (i.e. Somnolence, constipation and others) were explained to the patient and were aknowledged.  Potential benefits of a long term NSAIDs  use as well as potential risks  and complications were explained to the patient and were aknowledged.

## 2022-07-11 ENCOUNTER — Ambulatory Visit: Payer: Medicare Other | Admitting: Internal Medicine

## 2022-07-30 ENCOUNTER — Encounter: Payer: Self-pay | Admitting: Internal Medicine

## 2022-08-03 ENCOUNTER — Other Ambulatory Visit (HOSPITAL_BASED_OUTPATIENT_CLINIC_OR_DEPARTMENT_OTHER): Payer: Self-pay | Admitting: Nurse Practitioner

## 2022-08-05 ENCOUNTER — Other Ambulatory Visit (HOSPITAL_BASED_OUTPATIENT_CLINIC_OR_DEPARTMENT_OTHER): Payer: Self-pay | Admitting: Cardiology

## 2022-08-06 ENCOUNTER — Other Ambulatory Visit (HOSPITAL_BASED_OUTPATIENT_CLINIC_OR_DEPARTMENT_OTHER): Payer: Self-pay | Admitting: Cardiology

## 2022-08-28 ENCOUNTER — Inpatient Hospital Stay: Payer: Medicare Other | Attending: Oncology

## 2022-08-28 ENCOUNTER — Inpatient Hospital Stay: Payer: Medicare Other | Admitting: Oncology

## 2022-08-28 VITALS — BP 109/62 | HR 78 | Temp 98.2°F | Resp 18 | Ht 62.0 in | Wt 167.4 lb

## 2022-08-28 DIAGNOSIS — C252 Malignant neoplasm of tail of pancreas: Secondary | ICD-10-CM | POA: Diagnosis not present

## 2022-08-28 DIAGNOSIS — I10 Essential (primary) hypertension: Secondary | ICD-10-CM | POA: Insufficient documentation

## 2022-08-28 DIAGNOSIS — Z9221 Personal history of antineoplastic chemotherapy: Secondary | ICD-10-CM | POA: Diagnosis not present

## 2022-08-28 DIAGNOSIS — J449 Chronic obstructive pulmonary disease, unspecified: Secondary | ICD-10-CM | POA: Insufficient documentation

## 2022-08-28 DIAGNOSIS — Z87891 Personal history of nicotine dependence: Secondary | ICD-10-CM | POA: Diagnosis not present

## 2022-08-28 NOTE — Progress Notes (Signed)
[place] OFFICE PROGRESS NOTE   Diagnosis: Pancreas cancer  INTERVAL HISTORY:   Amanda Arnold returns as scheduled.  She generally feels well.  Good appetite.  She has chronic discomfort at the right anterior lateral iliac region with ambulation.  No other pain.  She had a mild case of COVID-19 in December 2023.  Objective:  Vital signs in last 24 hours:  Blood pressure 109/62, pulse 78, temperature 98.2 F (36.8 C), temperature source Oral, resp. rate 18, height 5' 2"$  (1.575 m), weight 167 lb 6.4 oz (75.9 kg), SpO2 98 %.    Lymphatics: No cervical, supraclavicular, axillary, or inguinal nodes Resp: Lungs clear bilaterally Cardio: Regular rate and rhythm GI: No hepatosplenomegaly, no mass, nontender Vascular: No leg edema   Lab Results:  Lab Results  Component Value Date   WBC 8.6 04/09/2022   HGB 13.6 04/09/2022   HCT 41.2 04/09/2022   MCV 96.7 04/09/2022   PLT 293.0 04/09/2022   NEUTROABS 4.6 04/09/2022    CMP  Lab Results  Component Value Date   NA 136 04/09/2022   K 3.9 04/09/2022   CL 102 04/09/2022   CO2 28 04/09/2022   GLUCOSE 248 (H) 04/09/2022   BUN 18 04/09/2022   CREATININE 0.73 04/09/2022   CALCIUM 9.4 04/09/2022   PROT 6.9 04/09/2022   ALBUMIN 4.1 04/09/2022   AST 18 04/09/2022   ALT 13 04/09/2022   ALKPHOS 85 04/09/2022   BILITOT 0.7 04/09/2022   GFRNONAA >60 05/22/2021   GFRAA 103 04/20/2020    Lab Results  Component Value Date   [id number] 11 04/25/2022    Medications: I have reviewed the patient's current medications.   Assessment/Plan: Pancreas cancer-stage IIb (pT2pN1) CT abdomen/pelvis 06/01/2020-2.9 x 3 cm pancreas tail mass, slight increase in size of 7 mm right middle lobe nodule MRI/MRCP 06/22/2020-solid 3.8 x 3 x 3.1 cm pancreas tail mass with focal occlusion of the splenic vein and dilated venous collaterals, no evidence of metastatic disease, bilateral adrenal adenomas CT renal stone study 08/02/2020-scattered  airspace opacities throughout the lung bases-new favored to represent infectious/inflammatory changes EUS 08/16/2020-pancreas tail mass, T2N0, FNA biopsy-suspicious for malignancy CTs 08/27/2020-unchanged right middle lobe nodule, superficial left upper back lesion, no change in pancreas tail mass, occluded splenic vein with collaterals at the splenic hilum, no enlarged abdominal pelvic lymph nodes Distal pancreatectomy/splenectomy 10/17/2020- 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  2/15 lymph nodes positive, positive lymphovascular and perineural invasion, resection margins negative, pT2pN1 Cycle 1 gemcitabine/capecitabine 11/21/2020, day 15 gemcitabine 12/05/2020 Cycle 2 gemcitabine/capecitabine 12/20/2020, day 15 gemcitabine 01/03/2021 Cycle 3 gemcitabine/capecitabine 01/16/2021, day 15 gemcitabine 01/30/2021 Cycle 4 gemcitabine/capecitabine 02/13/2021, day 15 gemcitabine 02/27/2021 Cycle 5 gemcitabine/capecitabine 03/13/2021 (capecitabine dose reduced secondary to hand/foot syndrome), day 15 gemcitabine 03/27/2021 Cycle 6 gemcitabine/capecitabine 04/10/2021, day 15 gemcitabine 04/24/2021 CT abdomen/pelvis 07/26/2021-slight increase in size of a cystic lesion at the distal pancreatic tail/stump.  Additional fluid collections at the surgical bed decreased in size. MRI abdomen 02/12/2022-unchanged nonenhancing fluid signal cyst at the distal pancreatic resection margin without enhancement consistent with a postoperative seroma or pseudocyst, no evidence of metastatic disease   2.   Gastric ulcer on EUS 08/16/2020-H. pylori positive 3.   COVID-19 infection January 2022 4.   COPD 5.   Hypertension 6.   Right abdomen/right leg pain-musculoskeletal? 7.   Right middle lobe nodule on CT 06/01/2020, increased from 2017, stable on CT 11/13/2020, stable on CT 04/23/2021 8.  History of tobacco use 9.   Superficial left upper back lesion  noted on CT 08/27/2020     Disposition: Amanda Arnold is in clinical remission from pancreas cancer.  We will follow-up on the CA 19-9 from today.  She will return for an office visit and CA 19-9 in 4 months.  Betsy Coder, MD  08/28/2022  8:50 AM

## 2022-08-30 LAB — CANCER ANTIGEN 19-9: CA 19-9: 10 U/mL (ref 0–35)

## 2022-09-01 ENCOUNTER — Telehealth: Payer: Self-pay

## 2022-09-01 NOTE — Telephone Encounter (Signed)
Patient gave verbal understanding had no further questions or concerns. 

## 2022-09-01 NOTE — Telephone Encounter (Signed)
-----   Message from Ladell Pier, MD sent at 08/31/2022  4:58 PM EST ----- Please call patient, ca19-9 is normal

## 2022-09-08 DIAGNOSIS — C252 Malignant neoplasm of tail of pancreas: Secondary | ICD-10-CM | POA: Diagnosis not present

## 2022-09-08 DIAGNOSIS — M25551 Pain in right hip: Secondary | ICD-10-CM | POA: Diagnosis not present

## 2022-09-09 ENCOUNTER — Ambulatory Visit (INDEPENDENT_AMBULATORY_CARE_PROVIDER_SITE_OTHER): Payer: Medicare Other | Admitting: Internal Medicine

## 2022-09-09 ENCOUNTER — Encounter: Payer: Self-pay | Admitting: Internal Medicine

## 2022-09-09 VITALS — BP 126/80 | HR 70 | Temp 98.0°F | Ht 62.0 in | Wt 167.0 lb

## 2022-09-09 DIAGNOSIS — E669 Obesity, unspecified: Secondary | ICD-10-CM

## 2022-09-09 DIAGNOSIS — E1169 Type 2 diabetes mellitus with other specified complication: Secondary | ICD-10-CM | POA: Diagnosis not present

## 2022-09-09 DIAGNOSIS — M5440 Lumbago with sciatica, unspecified side: Secondary | ICD-10-CM

## 2022-09-09 DIAGNOSIS — R634 Abnormal weight loss: Secondary | ICD-10-CM | POA: Diagnosis not present

## 2022-09-09 DIAGNOSIS — M25551 Pain in right hip: Secondary | ICD-10-CM | POA: Diagnosis not present

## 2022-09-09 DIAGNOSIS — I10 Essential (primary) hypertension: Secondary | ICD-10-CM

## 2022-09-09 DIAGNOSIS — R350 Frequency of micturition: Secondary | ICD-10-CM

## 2022-09-09 DIAGNOSIS — E538 Deficiency of other specified B group vitamins: Secondary | ICD-10-CM | POA: Diagnosis not present

## 2022-09-09 DIAGNOSIS — G8929 Other chronic pain: Secondary | ICD-10-CM | POA: Insufficient documentation

## 2022-09-09 DIAGNOSIS — C252 Malignant neoplasm of tail of pancreas: Secondary | ICD-10-CM

## 2022-09-09 LAB — COMPREHENSIVE METABOLIC PANEL
ALT: 12 U/L (ref 0–35)
AST: 22 U/L (ref 0–37)
Albumin: 3.9 g/dL (ref 3.5–5.2)
Alkaline Phosphatase: 92 U/L (ref 39–117)
BUN: 15 mg/dL (ref 6–23)
CO2: 29 mEq/L (ref 19–32)
Calcium: 9.4 mg/dL (ref 8.4–10.5)
Chloride: 104 mEq/L (ref 96–112)
Creatinine, Ser: 0.6 mg/dL (ref 0.40–1.20)
GFR: 88.45 mL/min (ref >60.00)
Glucose, Bld: 140 mg/dL — ABNORMAL HIGH (ref 70–99)
Potassium: 4.3 mEq/L (ref 3.5–5.1)
Sodium: 140 mEq/L (ref 135–145)
Total Bilirubin: 0.7 mg/dL (ref 0.2–1.2)
Total Protein: 6.8 g/dL (ref 6.0–8.3)

## 2022-09-09 LAB — CBC WITH DIFFERENTIAL/PLATELET
Basophils Absolute: 0.1 10*3/uL (ref 0.0–0.1)
Basophils Relative: 0.9 % (ref 0.0–3.0)
Eosinophils Absolute: 0.2 10*3/uL (ref 0.0–0.7)
Eosinophils Relative: 2.3 % (ref 0.0–5.0)
HCT: 43 % (ref 36.0–46.0)
Hemoglobin: 14.1 g/dL (ref 12.0–15.0)
Lymphocytes Relative: 40.4 % (ref 12.0–46.0)
Lymphs Abs: 3 10*3/uL (ref 0.7–4.0)
MCHC: 32.8 g/dL (ref 30.0–36.0)
MCV: 95 fl (ref 78.0–100.0)
Monocytes Absolute: 0.6 10*3/uL (ref 0.1–1.0)
Monocytes Relative: 8.5 % (ref 3.0–12.0)
Neutro Abs: 3.6 10*3/uL (ref 1.4–7.7)
Neutrophils Relative %: 47.9 % (ref 43.0–77.0)
Platelets: 319 10*3/uL (ref 150.0–400.0)
RBC: 4.53 Mil/uL (ref 3.87–5.11)
RDW: 13.8 % (ref 11.5–15.5)
WBC: 7.5 10*3/uL (ref 4.0–10.5)

## 2022-09-09 LAB — TSH: TSH: 1.43 u[IU]/mL (ref 0.35–5.50)

## 2022-09-09 LAB — VITAMIN B12: Vitamin B-12: 1071 pg/mL — ABNORMAL HIGH (ref 211–911)

## 2022-09-09 LAB — HEMOGLOBIN A1C: Hgb A1c MFr Bld: 6.8 % — ABNORMAL HIGH (ref 4.6–6.5)

## 2022-09-09 NOTE — Assessment & Plan Note (Addendum)
CA 19-9 (-)

## 2022-09-09 NOTE — Assessment & Plan Note (Signed)
New R trochanteric bursitis Pt declined injection Blue-Emu cream was recommended to use 2-3 times a day

## 2022-09-09 NOTE — Assessment & Plan Note (Signed)
NAS diet 

## 2022-09-09 NOTE — Assessment & Plan Note (Signed)
Chronic MSK/OA. R/o spinal stenosis  Norco prn  Potential benefits of a long term opioids use as well as potential risks (i.e. addiction risk, apnea etc) and complications (i.e. Somnolence, constipation and others) were explained to the patient and were aknowledged.  Potential benefits of a long term NSAIDs  use as well as potential risks  and complications were explained to the patient and were aknowledged.  Handicapped decal

## 2022-09-09 NOTE — Patient Instructions (Addendum)
trochanteric bursitis  Blue-Emu cream - use 2-3 times a day       Bursitis  Bursitis is when the fluid-filled sac (bursa) that covers and protects a joint is swollen (inflamed). Bursitis is most common near joints such as the knees, elbows, hips, and shoulders. It can cause pain and stiffness. What are the causes? An injury to a joint area. Repeated use of a joint. Infection. Certain conditions that cause swelling. What increases the risk? Putting stress on a joint over and over again. Having a condition that weakens your body's defense system (immune system). Doing any of these often: Lifting and reaching overhead. Kneeling or leaning on hard surfaces. Doing activities that have a motion that you do over and over again. This includes running and walking. What are the signs or symptoms? Common symptoms of this condition include: Pain that gets worse when you move the affected body part or use it to support your body weight. Irritation and swelling (inflammation). Stiffness. Other symptoms include: Redness. Swelling. Tenderness. Warmth. Pain that stays after rest. Fever or chills if there is an infection. How is this treated? This condition can often be treated at home with: Rest. Ice. Wrapping the area with an elastic bandage (compression). Keeping the affected area raised (elevation). Other treatments may include: Medicine for pain and swelling. Shots of medicine to the area to lessen swelling. Draining fluid out of the bursa. Antibiotic medicine for infection. Using a splint, brace, wrap, pads, or walking aid. Therapy if pain continues or you have limited movement. Surgery. Follow these instructions at home: Medicines Take over-the-counter and prescription medicines only as told by your doctor. If you were prescribed an antibiotic medicine, take it as told by your doctor. Do not stop taking it even if you start to feel better. Managing pain, stiffness, and  swelling     Raise the injured area above the level of your heart while you are sitting or lying down. If told, put ice on the affected area. To do this: Put ice in a plastic bag. Place a towel between your skin and the bag, or between your splint or brace and the bag. Leave the ice on for 20 minutes, 2-3 times a day. Take off the ice if your skin turns bright red. This is very important. If you cannot feel pain, heat, or cold, you have a greater risk of damage to the area. If told, put heat on the affected area. Do this as often as told by your doctor. Use the heat source that your doctor recommends, such as a moist heat pack or a heating pad. Place a towel between your skin and the heat source. Leave the heat on for 20-30 minutes. Take off the heat if your skin turns bright red. This is very important. If you cannot feel pain, heat, or cold, you have a greater risk of getting burned. General instructions Rest the affected area as told by your doctor. Avoid doing things that make the pain worse. Use a splint, brace, pad, wrap, or walking aid as told by your doctor. Keep all follow-up visits. Preventing symptoms Wear knee pads if you kneel often. Wear running or walking shoes that fit you well. Take a lot of breaks during activities that involve doing the same movements again and again. Before you do any activity that takes a lot of effort, get your body ready by stretching. Stay at a healthy weight or lose weight if your doctor says you should. If you need  help doing this, ask your doctor. Exercise often. If you start any new physical activity, do it slowly. Work with your physical or occupational therapist and doctor to find what caused the bursitis. Contact a doctor if: You have a fever or chills. You have symptoms that do not get better with treatment. You have pain or swelling that: Gets worse. Goes away and then comes back. You have pus coming from the affected area. You have  redness around the affected area. The affected area is warm to the touch. Summary Bursitis is when the fluid-filled sac (bursa) that covers and protects a joint is swollen. Rest the affected area as told by your doctor. Avoid doing things that make the pain worse. Put ice on the affected area as told by your doctor. This information is not intended to replace advice given to you by your health care provider. Make sure you discuss any questions you have with your health care provider. Document Revised: 06/25/2021 Document Reviewed: 06/25/2021 Elsevier Patient Education  [place].

## 2022-09-09 NOTE — Progress Notes (Signed)
Subjective:  Patient ID: Amanda Arnold, female    DOB: 1946-03-09  Age: 77 y.o. MRN: [id number]  CC: Follow-up (Having right side pain has been going on for awhile )   HPI LORENIA CHAINEY presents for R side/hip pain, worse w/walking x months  F/u LBP, cancer  Outpatient Medications Prior to Visit  Medication Sig Dispense Refill   ALPRAZolam (XANAX) 0.5 MG tablet Take 1 tablet (0.5 mg total) by mouth 2 (two) times daily as needed for anxiety. TAKE 1 TABLET(0.5 MG) BY MOUTH TWICE DAILY AS NEEDED FOR ANXIETY 60 tablet 2   Cholecalciferol (VITAMIN D3) 50 MCG (2000 UT) capsule Take 1 capsule (2,000 Units total) by mouth daily. 100 capsule 3   Cyanocobalamin (VITAMIN B-12) 1000 MCG SUBL Place 1 tablet (1,000 mcg total) under the tongue daily. 100 tablet 3   HYDROcodone-acetaminophen (NORCO) 7.5-325 MG tablet Take 1 tablet by mouth every 8 (eight) hours as needed for severe pain. 60 tablet 0   ibuprofen (ADVIL) 200 MG tablet Take 200 mg by mouth every 6 (six) hours as needed for mild pain or moderate pain.     rosuvastatin (CRESTOR) 10 MG tablet TAKE 1 TABLET(10 MG) BY MOUTH DAILY 30 tablet 0   promethazine-dextromethorphan (PROMETHAZINE-DM) 6.25-15 MG/5ML syrup Take 5 mLs by mouth 4 (four) times daily as needed for cough. (Patient not taking: Reported on 08/28/2022) 240 mL 0   No facility-administered medications prior to visit.    ROS: Review of Systems  Constitutional:  Positive for fatigue and unexpected weight change. Negative for activity change, appetite change and chills.  HENT:  Negative for congestion, mouth sores and sinus pressure.   Eyes:  Negative for visual disturbance.  Respiratory:  Negative for cough and chest tightness.   Gastrointestinal:  Negative for abdominal pain and nausea.  Genitourinary:  Negative for difficulty urinating, frequency and vaginal pain.  Musculoskeletal:  Positive for arthralgias and back pain. Negative for gait problem.  Skin:  Negative for  pallor and rash.  Neurological:  Negative for dizziness, tremors, weakness, numbness and headaches.  Psychiatric/Behavioral:  Negative for confusion and sleep disturbance.     Objective:  BP 126/80 (BP Location: Right Arm, Patient Position: Sitting, Cuff Size: Normal)   Pulse 70   Temp 98 F (36.7 C) (Oral)   Ht '5\' 2"'$  (1.575 m)   Wt 167 lb (75.8 kg)   LMP  (LMP Unknown)   SpO2 98%   BMI 30.54 kg/m   BP Readings from Last 3 Encounters:  09/09/22 126/80  08/28/22 109/62  07/10/22 118/64    Wt Readings from Last 3 Encounters:  09/09/22 167 lb (75.8 kg)  08/28/22 167 lb 6.4 oz (75.9 kg)  07/10/22 173 lb (78.5 kg)    Physical Exam Constitutional:      General: She is not in acute distress.    Appearance: She is well-developed. She is obese.  HENT:     Head: Normocephalic.     Right Ear: External ear normal.     Left Ear: External ear normal.     Nose: Nose normal.  Eyes:     General:        Right eye: No discharge.        Left eye: No discharge.     Conjunctiva/sclera: Conjunctivae normal.     Pupils: Pupils are equal, round, and reactive to light.  Neck:     Thyroid: No thyromegaly.     Vascular: No JVD.  Trachea: No tracheal deviation.  Cardiovascular:     Rate and Rhythm: Normal rate and regular rhythm.     Heart sounds: Normal heart sounds.  Pulmonary:     Effort: No respiratory distress.     Breath sounds: No stridor. No wheezing.  Abdominal:     General: Bowel sounds are normal. There is no distension.     Palpations: Abdomen is soft. There is no mass.     Tenderness: There is no abdominal tenderness. There is no guarding or rebound.  Musculoskeletal:        General: Tenderness present.     Cervical back: Normal range of motion and neck supple. No rigidity.  Lymphadenopathy:     Cervical: No cervical adenopathy.  Skin:    Findings: No erythema or rash.  Neurological:     Mental Status: She is oriented to person, place, and time.     Cranial  Nerves: No cranial nerve deficit.     Motor: No abnormal muscle tone.     Coordination: Coordination normal.     Gait: Gait abnormal.     Deep Tendon Reflexes: Reflexes normal.  Psychiatric:        Behavior: Behavior normal.        Thought Content: Thought content normal.        Judgment: Judgment normal.   LS spine w/pain  R troch major w/pain  Lab Results  Component Value Date   WBC 8.6 04/09/2022   HGB 13.6 04/09/2022   HCT 41.2 04/09/2022   PLT 293.0 04/09/2022   GLUCOSE 248 (H) 04/09/2022   CHOL 167 09/17/2017   TRIG 92.0 09/17/2017   HDL 53.00 09/17/2017   LDLDIRECT 56 06/25/2021   LDLCALC 96 09/17/2017   ALT 13 04/09/2022   AST 18 04/09/2022   NA 136 04/09/2022   K 3.9 04/09/2022   CL 102 04/09/2022   CREATININE 0.73 04/09/2022   BUN 18 04/09/2022   CO2 28 04/09/2022   TSH 1.06 10/07/2021   INR 1.1 (H) 08/22/2020   HGBA1C 7.1 (H) 04/09/2022    MM 3D SCREEN BREAST BILATERAL  Result Date: 03/10/2022 CLINICAL DATA:  Screening. EXAM: DIGITAL SCREENING BILATERAL MAMMOGRAM WITH TOMOSYNTHESIS AND CAD TECHNIQUE: Bilateral screening digital craniocaudal and mediolateral oblique mammograms were obtained. Bilateral screening digital breast tomosynthesis was performed. The images were evaluated with computer-aided detection. COMPARISON:  Previous exam(s). ACR Breast Density Category b: There are scattered areas of fibroglandular density. FINDINGS: There are no findings suspicious for malignancy. IMPRESSION: No mammographic evidence of malignancy. A result letter of this screening mammogram will be mailed directly to the patient. RECOMMENDATION: Screening mammogram in one year. (Code:SM-B-01Y) BI-RADS CATEGORY  1: Negative. Electronically Signed   By: Abelardo Diesel M.D.   On: 03/10/2022 16:53    Assessment & Plan:   Problem List Items Addressed This Visit       Cardiovascular and Mediastinum   Essential hypertension    NAS diet      Relevant Orders   Comprehensive  metabolic panel   Vitamin 123456   Urinalysis   TSH   CBC with Differential/Platelet   Hemoglobin A1c     Digestive   Cancer of pancreas, tail (HCC)    CA 19-9 (-)      Relevant Orders   Comprehensive metabolic panel   Vitamin 123456   Urinalysis   TSH   CBC with Differential/Platelet   Hemoglobin A1c     Endocrine   Diabetes mellitus type 2 in  obese (HCC)   Relevant Orders   Comprehensive metabolic panel   Hemoglobin A1c     Other   Weight loss   Relevant Orders   TSH   LOW BACK PAIN    Chronic MSK/OA. R/o spinal stenosis  Norco prn  Potential benefits of a long term opioids use as well as potential risks (i.e. addiction risk, apnea etc) and complications (i.e. Somnolence, constipation and others) were explained to the patient and were aknowledged.  Potential benefits of a long term NSAIDs  use as well as potential risks  and complications were explained to the patient and were aknowledged.  Handicapped decal       Hip pain, chronic, right - Primary    New R trochanteric bursitis Pt declined injection Blue-Emu cream was recommended to use 2-3 times a day       Other Visit Diagnoses     B12 deficiency       Relevant Orders   Vitamin B12         No orders of the defined types were placed in this encounter.     Follow-up: Return in about 3 months (around 12/08/2022) for a follow-up visit.  Walker Kehr, MD

## 2022-09-12 LAB — URINALYSIS
Bilirubin Urine: NEGATIVE
Hgb urine dipstick: NEGATIVE
Ketones, ur: NEGATIVE
Leukocytes,Ua: NEGATIVE
Nitrite: NEGATIVE
Specific Gravity, Urine: >=1.03 — AB (ref 1.000–1.030)
Total Protein, Urine: NEGATIVE
Urine Glucose: NEGATIVE
Urobilinogen, UA: 0.2 (ref 0.0–1.0)
pH: 6 (ref 5.0–8.0)

## 2022-09-13 ENCOUNTER — Other Ambulatory Visit (HOSPITAL_BASED_OUTPATIENT_CLINIC_OR_DEPARTMENT_OTHER): Payer: Self-pay | Admitting: Cardiology

## 2022-09-15 NOTE — Addendum Note (Signed)
Addended by: Earnstine Regal on: 09/15/2022 02:19 PM   Modules accepted: Orders

## 2022-09-16 ENCOUNTER — Other Ambulatory Visit (INDEPENDENT_AMBULATORY_CARE_PROVIDER_SITE_OTHER): Payer: Medicare Other

## 2022-09-16 DIAGNOSIS — R350 Frequency of micturition: Secondary | ICD-10-CM | POA: Diagnosis not present

## 2022-09-16 DIAGNOSIS — C252 Malignant neoplasm of tail of pancreas: Secondary | ICD-10-CM

## 2022-09-16 LAB — URINALYSIS, ROUTINE W REFLEX MICROSCOPIC
Bilirubin Urine: NEGATIVE
Hgb urine dipstick: NEGATIVE
Ketones, ur: NEGATIVE
Leukocytes,Ua: NEGATIVE
Nitrite: NEGATIVE
Specific Gravity, Urine: 1.025 (ref 1.000–1.030)
Total Protein, Urine: NEGATIVE
Urine Glucose: NEGATIVE
Urobilinogen, UA: 0.2 (ref 0.0–1.0)
pH: 6 (ref 5.0–8.0)

## 2022-11-02 ENCOUNTER — Other Ambulatory Visit (HOSPITAL_BASED_OUTPATIENT_CLINIC_OR_DEPARTMENT_OTHER): Payer: Self-pay | Admitting: Cardiology

## 2022-11-04 ENCOUNTER — Other Ambulatory Visit: Payer: Self-pay | Admitting: Cardiology

## 2022-11-04 ENCOUNTER — Encounter: Payer: Self-pay | Admitting: Internal Medicine

## 2022-11-04 DIAGNOSIS — H0100A Unspecified blepharitis right eye, upper and lower eyelids: Secondary | ICD-10-CM | POA: Diagnosis not present

## 2022-11-04 DIAGNOSIS — H26492 Other secondary cataract, left eye: Secondary | ICD-10-CM | POA: Diagnosis not present

## 2022-11-04 DIAGNOSIS — H5213 Myopia, bilateral: Secondary | ICD-10-CM | POA: Diagnosis not present

## 2022-11-04 DIAGNOSIS — H43813 Vitreous degeneration, bilateral: Secondary | ICD-10-CM | POA: Diagnosis not present

## 2022-11-04 DIAGNOSIS — H524 Presbyopia: Secondary | ICD-10-CM | POA: Diagnosis not present

## 2022-11-04 DIAGNOSIS — E119 Type 2 diabetes mellitus without complications: Secondary | ICD-10-CM | POA: Diagnosis not present

## 2022-11-04 DIAGNOSIS — H52203 Unspecified astigmatism, bilateral: Secondary | ICD-10-CM | POA: Diagnosis not present

## 2022-11-04 DIAGNOSIS — H04123 Dry eye syndrome of bilateral lacrimal glands: Secondary | ICD-10-CM | POA: Diagnosis not present

## 2022-11-04 LAB — HM DIABETES EYE EXAM

## 2022-11-04 NOTE — Telephone Encounter (Signed)
*  STAT* If patient is at the pharmacy, call can be transferred to refill team.   1. Which medications need to be refilled? (please list name of each medication and dose if known) rosuvastatin (CRESTOR) 10 MG tablet   2. Which pharmacy/location (including street and city if local pharmacy) is medication to be sent to?  [id number] - [place], [place] - [address] AT [facility] [address]    3. Do they need a 30 day or 90 day supply? 90 day      Pt is completely out and has been scheduled for office visit on 02/12/2023.

## 2022-11-04 NOTE — Telephone Encounter (Signed)
Pt has not been seen since 06/25/2021. Pt has an upcoming appt on 02/12/2023 with Dr. Anne Fu. Would Dr. Anne Fu like to refill this medication until appt in August 2024 or does pt need to be seen sooner? Please address

## 2022-11-05 DIAGNOSIS — L02221 Furuncle of abdominal wall: Secondary | ICD-10-CM | POA: Diagnosis not present

## 2022-11-05 DIAGNOSIS — D1801 Hemangioma of skin and subcutaneous tissue: Secondary | ICD-10-CM | POA: Diagnosis not present

## 2022-11-05 DIAGNOSIS — L821 Other seborrheic keratosis: Secondary | ICD-10-CM | POA: Diagnosis not present

## 2022-11-05 DIAGNOSIS — L02223 Furuncle of chest wall: Secondary | ICD-10-CM | POA: Diagnosis not present

## 2022-11-05 DIAGNOSIS — L304 Erythema intertrigo: Secondary | ICD-10-CM | POA: Diagnosis not present

## 2022-11-05 MED ORDER — ROSUVASTATIN CALCIUM 10 MG PO TABS: ORAL_TABLET | ORAL | 0 refills | Status: DC

## 2022-12-10 ENCOUNTER — Ambulatory Visit: Payer: Medicare Other | Attending: Cardiology | Admitting: Cardiology

## 2022-12-10 VITALS — BP 92/60 | HR 73 | Ht 62.0 in | Wt 166.4 lb

## 2022-12-10 DIAGNOSIS — I951 Orthostatic hypotension: Secondary | ICD-10-CM | POA: Diagnosis not present

## 2022-12-10 DIAGNOSIS — I10 Essential (primary) hypertension: Secondary | ICD-10-CM | POA: Diagnosis not present

## 2022-12-10 DIAGNOSIS — I2510 Atherosclerotic heart disease of native coronary artery without angina pectoris: Secondary | ICD-10-CM

## 2022-12-10 NOTE — Progress Notes (Signed)
Cardiology Office Note:    Date:  12/10/2022   ID:  Amanda Arnold, DOB 10/27/1945, MRN [id number]  PCP:  Tresa Garter, MD   [facility] Providers Cardiologist:  Donato Schultz, MD     Referring MD: Tresa Garter, MD    History of Present Illness:    Amanda Arnold is a 77 y.o. female here for the follow-up of coronary artery calcification noted on CT, hypertension hyperlipidemia palpitations COPD right lung nodule.  Prior echo showed normal ejection fraction.  Coronary CTA in 2021 showed calcified plaque in the RCA that was nonobstructive otherwise normal.  In 2022 stable lung nodule noted.  Reduction in size.  No recommendation for repeat scan.  In April 2022 she underwent pancreatectomy and splenectomy with chemo after pancreatic cancer diagnosis.  Overall doing quite well from cardiac standpoint.  No chest pain fevers chills nausea vomiting syncope  Past Medical History:  Diagnosis Date   Abdominal pain, epigastric 12/18/2009   ANXIETY 05/25/2006   BRONCHITIS, ACUTE 12/08/2007   Complication of anesthesia     difficulty waking up x 1 - "years ago"   Constipation    COPD 05/25/2006   COVID-19 07/10/2022   DEPRESSION 01/02/2006   ESOTROPIA, LEFT EYE 07-22-47   Family history of breast cancer 11/26/2020   FIBROMYALGIA 04/02/2006   GERD (gastroesophageal reflux disease)    Headache(784.0) 12/08/2007   History of blood transfusion    HYPERLIPIDEMIA 04/30/2006   HYPERTENSION 05/25/2006   Hypertension    INSOMNIA, PERSISTENT 02/22/2008   Lazy eye    left - no surgery   LOW BACK PAIN 01/27/2007   Migraines    NEURALGIA, 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/2009   Neuropathy    OBSTRUCTIVE SLEEP APNEA 04/17/2010   does not use CPAP   OSTEOARTHRITIS 05/25/2006   pancreatic ca dx'd 06/2020   Polyuria    Pre-diabetes    diet controlled   Psychosomatic disease 2011   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   T M J 05/11/2009    Past Surgical History:  Procedure  Laterality Date   BIOPSY  08/16/2020   Procedure: BIOPSY;  Surgeon: Lemar Lofty., MD;  Location: [facility] ENDOSCOPY;  Service: Endoscopy;;   CHOLECYSTECTOMY     COLONOSCOPY     ESOPHAGOGASTRODUODENOSCOPY (EGD) WITH PROPOFOL N/A 08/16/2020   Procedure: ESOPHAGOGASTRODUODENOSCOPY (EGD) WITH PROPOFOL;  Surgeon: Lemar Lofty., MD;  Location: [facility] ENDOSCOPY;  Service: Endoscopy;  Laterality: N/A;   EUS N/A 08/16/2020   Procedure: UPPER ENDOSCOPIC ULTRASOUND (EUS) RADIAL;  Surgeon: Meridee Score Netty Starring., MD;  Location: [facility] ENDOSCOPY;  Service: Endoscopy;  Laterality: N/A;   EYE SURGERY Bilateral    cataract with lens implant   FINE NEEDLE ASPIRATION  08/16/2020   Procedure: FINE NEEDLE ASPIRATION (FNA) LINEAR;  Surgeon: Lemar Lofty., MD;  Location: [facility] ENDOSCOPY;  Service: Endoscopy;;   MASS EXCISION Left 10/06/2016   Procedure: EXCISION LEFT BUTTOCKS MASS;  Surgeon: Ovidio Kin, MD;  Location: [facility];  Service: General;  Laterality: Left;   PORT-A-CATH REMOVAL N/A 06/13/2021   Procedure: PORT REMOVAL;  Surgeon: Almond Lint, MD;  Location: [facility] OR;  Service: General;  Laterality: N/A;   PORTACATH PLACEMENT N/A 11/14/2020   Procedure: INSERTION PORT-A-CATH;  Surgeon: Almond Lint, MD;  Location: [facility] OR;  Service: General;  Laterality: N/A;   XI ROBOTIC ASSISTED LAPAROSCOPIC DISTAL PANCREATECTOMY N/A 10/17/2020   Procedure: XI ROBOTIC ASSISTED DISTAL PANCREATECTOMY AND SPLENECTOMY;  Surgeon: Almond Lint, MD;  Location: [facility] OR;  Service:  General;  Laterality: N/A;    Current Medications: Current Meds  Medication Sig   ALPRAZolam (XANAX) 0.5 MG tablet Take 1 tablet (0.5 mg total) by mouth 2 (two) times daily as needed for anxiety. TAKE 1 TABLET(0.5 MG) BY MOUTH TWICE DAILY AS NEEDED FOR ANXIETY   Cholecalciferol (VITAMIN D3) 50 MCG (2000 UT) capsule Take 1 capsule (2,000 Units total) by mouth daily.   Cyanocobalamin (VITAMIN B-12) 1000 MCG SUBL Place 1 tablet (1,000  mcg total) under the tongue daily.   HYDROcodone-acetaminophen (NORCO) 7.5-325 MG tablet Take 1 tablet by mouth every 8 (eight) hours as needed for severe pain.   ibuprofen (ADVIL) 200 MG tablet Take 200 mg by mouth every 6 (six) hours as needed for mild pain or moderate pain.   promethazine-dextromethorphan (PROMETHAZINE-DM) 6.25-15 MG/5ML syrup Take 5 mLs by mouth 4 (four) times daily as needed for cough.   rosuvastatin (CRESTOR) 10 MG tablet TAKE 1 TABLET(10 MG) BY MOUTH DAILY     Allergies:   Morphine and codeine, Bupropion hcl, Ezetimibe, Metformin and related, Naproxen, Trazodone hcl, and Tramadol hcl   Social History   Socioeconomic History   Marital status: Married    Spouse name: Not on file   Number of children: 5   Years of education: Not on file   Highest education level: Not on file  Occupational History   Occupation: house wife    Employer: UNEMPLOYED  Tobacco Use   Smoking status: Former    Types: Cigarettes    Quit date: 07/10/2013    Years since quitting: 9.4   Smokeless tobacco: Current    Types: Snuff  Vaping Use   Vaping Use: Never used  Substance and Sexual Activity   Alcohol use: No    Alcohol/week: 0.0 standard drinks of alcohol   Drug use: No   Sexual activity: Not Currently    Birth control/protection: Post-menopausal  Other Topics Concern   Not on file  Social History Narrative   Not on file   Social Determinants of Health   Financial Resource Strain: Low Risk  (12/16/2021)   Overall Financial Resource Strain (CARDIA)    Difficulty of Paying Living Expenses: Not hard at all  Food Insecurity: No Food Insecurity (12/16/2021)   Hunger Vital Sign    Worried About Running Out of Food in the Last Year: Never true    Ran Out of Food in the Last Year: Never true  Transportation Needs: No Transportation Needs (12/16/2021)   PRAPARE - Administrator, Civil Service (Medical): No    Lack of Transportation (Non-Medical): No  Physical Activity:  Inactive (12/16/2021)   Exercise Vital Sign    Days of Exercise per Week: 0 days    Minutes of Exercise per Session: 0 min  Stress: No Stress Concern Present (12/16/2021)   [employer] of Occupational Health - Occupational Stress Questionnaire    Feeling of Stress : Not at all  Social Connections: Socially Integrated (12/16/2021)   Social Connection and Isolation Panel [NHANES]    Frequency of Communication with Friends and Family: More than three times a week    Frequency of Social Gatherings with Friends and Family: More than three times a week    Attends Religious Services: More than 4 times per year    Active Member of [employer] or Organizations: Yes    Attends Engineer, structural: More than 4 times per year    Marital Status: Married     Family History: The  patient's family history includes Breast cancer in her maternal aunt; Cancer in her maternal uncle; Cervical cancer in her mother; Heart attack in her father; Heart disease in her mother; Hypertension in her brother; Leukemia in her brother; Skin cancer in her daughter and mother. There is no history of Colon cancer or Stomach cancer.  ROS:   Please see the history of present illness.     All other systems reviewed and are negative.  EKGs/Labs/Other Studies Reviewed:    The following studies were reviewed today: Cardiac Studies & Procedures       ECHOCARDIOGRAM  ECHOCARDIOGRAM COMPLETE 03/13/2020  Narrative ECHOCARDIOGRAM REPORT    Patient Name:   TIAJAH LABEAN Date of Exam: 03/13/2020 Medical Rec #:  [id number]       Height:       62.0 in Accession #:    [id number]      Weight:       192.0 lb Date of Birth:  1946-06-19      BSA:          1.879 m Patient Age:    73 years        BP:           122/58 mmHg Patient Gender: F               HR:           66 bpm. Exam Location:  [address]  Procedure: 2D Echo, Color Doppler, Cardiac Doppler and 3D Echo  Indications:    R94.31 Abnormal EKG  History:         Patient has no prior history of Echocardiogram examinations. Abnormal ECG, COPD, Signs/Symptoms:Chest Pain, Dizziness/Lightheadedness, Shortness of Breath and chest tightness, fatigue, palpitations; Risk Factors:Hypertension, Sleep Apnea, Family History of Coronary Artery Disease, Dyslipidemia and Current Smoker.  Sonographer:    Farrel Conners RDCS Referring Phys: [id number] ALEKSEI V PLOTNIKOV  IMPRESSIONS   1. Left ventricular ejection fraction by 3D volume is 63 %. The left ventricle has normal function. The left ventricle has no regional wall motion abnormalities. Left ventricular diastolic parameters are consistent with Grade I diastolic dysfunction (impaired relaxation). 2. Right ventricular systolic function is normal. The right ventricular size is normal. Tricuspid regurgitation signal is inadequate for assessing PA pressure. 3. The mitral valve is normal in structure. Trivial mitral valve regurgitation. No evidence of mitral stenosis. 4. The aortic valve is grossly normal. Aortic valve regurgitation is not visualized. No aortic stenosis is present. 5. The inferior vena cava is normal in size with greater than 50% respiratory variability, suggesting right atrial pressure of 3 mmHg.  FINDINGS Left Ventricle: Left ventricular ejection fraction by 3D volume is 63 %. The left ventricle has normal function. The left ventricle has no regional wall motion abnormalities. The left ventricular internal cavity size was normal in size. There is no left ventricular hypertrophy. Left ventricular diastolic parameters are consistent with Grade I diastolic dysfunction (impaired relaxation).  Right Ventricle: The right ventricular size is normal. No increase in right ventricular wall thickness. Right ventricular systolic function is normal. Tricuspid regurgitation signal is inadequate for assessing PA pressure.  Left Atrium: Left atrial size was normal in size.  Right Atrium: Right atrial size was  normal in size.  Pericardium: There is no evidence of pericardial effusion. Presence of pericardial fat pad.  Mitral Valve: The mitral valve is normal in structure. Normal mobility of the mitral valve leaflets. Trivial mitral valve regurgitation. No evidence of mitral valve stenosis.  Tricuspid Valve: The tricuspid valve is normal in structure. Tricuspid valve regurgitation is trivial. No evidence of tricuspid stenosis.  Aortic Valve: The aortic valve is grossly normal. Aortic valve regurgitation is not visualized. No aortic stenosis is present.  Pulmonic Valve: The pulmonic valve was grossly normal. Pulmonic valve regurgitation is trivial. No evidence of pulmonic stenosis.  Aorta: The aortic root and ascending aorta are structurally normal, with no evidence of dilitation.  Venous: The inferior vena cava is normal in size with greater than 50% respiratory variability, suggesting right atrial pressure of 3 mmHg.  IAS/Shunts: The interatrial septum appears to be lipomatous. No atrial level shunt detected by color flow Doppler.   LEFT VENTRICLE PLAX 2D LVIDd:         5.10 cm         Diastology LVIDs:         2.90 cm         LV e' lateral:   6.53 cm/s LV PW:         0.90 cm         LV E/e' lateral: 12.0 LV IVS:        0.70 cm         LV e' medial:    6.42 cm/s LVOT diam:     2.20 cm         LV E/e' medial:  12.2 LV SV:         79 LV SV Index:   42 LVOT Area:     3.80 cm        3D Volume EF LV 3D EF:    Left ventricular ejection fraction by 3D volume is 63 %.  3D Volume EF: 3D EF:        63 % LV EDV:       95 ml LV ESV:       35 ml LV SV:        60 ml  RIGHT VENTRICLE RV S prime:     9.03 cm/s TAPSE (M-mode): 1.9 cm  LEFT ATRIUM             Index       RIGHT ATRIUM           Index LA diam:        4.50 cm 2.40 cm/m  RA Area:     13.10 cm LA Vol (A2C):   65.2 ml 34.70 ml/m RA Volume:   29.40 ml  15.65 ml/m LA Vol (A4C):   55.2 ml 29.38 ml/m LA Biplane Vol: 61.5 ml  32.73 ml/m AORTIC VALVE LVOT Vmax:   80.00 cm/s LVOT Vmean:  50.550 cm/s LVOT VTI:    0.208 m  AORTA Ao Root diam: 3.60 cm Ao Asc diam:  3.60 cm  MITRAL VALVE MV Area (PHT):             SHUNTS MV Decel Time: 309 msec    Systemic VTI:  0.21 m MV E velocity: 78.60 cm/s  Systemic Diam: 2.20 cm MV A velocity: 94.10 cm/s MV E/A ratio:  0.84  Weston Brass MD Electronically signed by Weston Brass MD Signature Date/Time: 03/13/2020/2:19:41 PM    Final     CT SCANS  CT CORONARY MORPH W/CTA COR W/SCORE 04/24/2020  Addendum 04/24/2020  1:03 PM ADDENDUM REPORT: 04/24/2020 13:00  CLINICAL DATA:  77 year old with chest pain  EXAM: Cardiac/Coronary  CTA  TECHNIQUE: The patient was scanned on a [employer].  FINDINGS: A  120 kV prospective scan was triggered in the descending thoracic aorta at 111 HU's. Axial non-contrast 3 mm slices were carried out through the heart. The data set was analyzed on a dedicated work station and scored using the Agatson method. Gantry rotation speed was 250 msecs and collimation was .6 mm. Beta blockade and 0.8 mg of sl NTG was given. The 3D data set was reconstructed in 5% intervals of the 67-82 % of the R-R cycle. Diastolic phases were analyzed on a dedicated work station using MPR, MIP and VRT modes. The patient received 80 cc of contrast.  Aorta:  Normal size.  No calcifications.  No dissection.  Aortic Valve:  Trileaflet.  No calcifications.  Coronary Arteries:  Normal coronary origin.  Right dominance.  RCA is a large dominant artery that gives rise to PDA and PLA. Mild RCA prox/mid calcified plaque, 0-24% stenosis.  Left main is a large artery that gives rise to LAD and LCX arteries.  LAD is a large vessel that has no plaque.  LCX is a non-dominant artery that gives rise to one large OM1 branch. There is no plaque.  Other findings:  Normal pulmonary vein drainage into the left atrium.  Normal left atrial  appendage without a thrombus.  Normal size of the pulmonary artery.  Please see radiology report for non cardiac findings.  IMPRESSION: 1. Coronary calcium score of 8. This was 24 percentile for age and sex matched control.  2. Normal coronary origin with right dominance.  3.  Mild RCA calcified plaque, 0-24% stenosis.  4. CAD-RADS 1. Minimal non-obstructive CAD (0-24%). Consider non-atherosclerotic causes of chest pain. Consider preventive therapy and risk factor modification.  Donato Schultz, MD [facility]   Electronically Signed By: Donato Schultz MD On: 04/24/2020 13:00  Narrative EXAM: OVER-READ INTERPRETATION  CT CHEST  The following report is an over-read performed by radiologist Dr. Trudie Reed of [facility] on 04/24/2020. This over-read does not include interpretation of cardiac or coronary anatomy or pathology. The coronary calcium score/coronary CTA interpretation by the cardiologist is attached.  COMPARISON:  Chest CT 12/21/2009.  FINDINGS: 6 x 4 mm (mean diameter 5 mm) right middle lobe pulmonary nodule (axial image 19 of series 12). Within the visualized portions of the thorax there are no other larger more suspicious appearing pulmonary nodules or masses, there is no acute consolidative airspace disease, no pleural effusions, no pneumothorax and no lymphadenopathy. Visualized portions of the upper abdomen are unremarkable. There are no aggressive appearing lytic or blastic lesions noted in the visualized portions of the skeleton.  IMPRESSION: 1. 5 mm right middle lobe pulmonary nodule, nonspecific, but statistically likely benign. No follow-up needed if patient is low-risk. Non-contrast chest CT can be considered in 12 months if patient is high-risk. This recommendation follows the consensus statement: Guidelines for Management of Incidental Pulmonary Nodules Detected on CT Images: From the Fleischner Society 2017; Radiology 2017;  284:228-243.  Electronically Signed: By: Trudie Reed M.D. On: 04/24/2020 12:29           EKG:  12/10/2022-normal sinus rhythm 73 no other changes. Prior EKG shows normal sinus rhythm PAC 71 bpm  Recent Labs: 09/09/2022: ALT 12; BUN 15; Creatinine, Ser 0.60; Hemoglobin 14.1; Platelets 319.0; Potassium 4.3; Sodium 140; TSH 1.43  Recent Lipid Panel    Component Value Date/Time   CHOL 167 09/17/2017 0952   TRIG 92.0 09/17/2017 0952   HDL 53.00 09/17/2017 0952   CHOLHDL 3 09/17/2017 0952   VLDL 18.4  09/17/2017 0952   LDLCALC 96 09/17/2017 0952   LDLDIRECT 56 06/25/2021 0958     Risk Assessment/Calculations:               Physical Exam:    VS:  BP 92/60   Pulse 73   Ht 5\' 2"  (1.575 m)   Wt 166 lb 6.4 oz (75.5 kg)   LMP  (LMP Unknown)   SpO2 98%   BMI 30.43 kg/m     Wt Readings from Last 3 Encounters:  12/10/22 166 lb 6.4 oz (75.5 kg)  09/09/22 167 lb (75.8 kg)  08/28/22 167 lb 6.4 oz (75.9 kg)     GEN:  Well nourished, well developed in no acute distress HEENT: Normal NECK: No JVD; No carotid bruits LYMPHATICS: No lymphadenopathy CARDIAC: RRR, no murmurs, rubs, gallops RESPIRATORY:  Clear to auscultation without rales, wheezing or rhonchi  ABDOMEN: Soft, non-tender, non-distended MUSCULOSKELETAL:  No edema; No deformity  SKIN: Warm and dry NEUROLOGIC:  Alert and oriented x 3 PSYCHIATRIC:  Normal affect   ASSESSMENT:    1. Coronary artery calcification seen on CT scan   2. Essential hypertension   3. Orthostatic hypotension    PLAN:    In order of problems listed above:  Prior history of coronary artery calcification/atypical chest discomfort - Nuclear stress test normal 2016 -Coronary CTA 04/2020, calcified plaque RCA nonobstructive.  Otherwise stable. -Secondary risk factor prevention except for aspirin secondary to nosebleeds. -Continue with Crestor 10 mg.  LDL goal less than 70.  Palpitations/PACs -Previously on metoprolol but this was  stopped because of low blood pressure.  Rare sensation of palpitations at this point.  Overall doing well.  Hypertension with prior history of orthostatic hypotension - Has had symptoms compatible with orthostatic hypotension.  Adequate hydration, be careful with positional changes.  Hyperlipidemia - Crestor.  LDL goal less than 70.  Pancreatic cancer with right lung nodule - Chemo pancreatectomy splenectomy.  Stable.  Lung nodule has been reduced in size.          Medication Adjustments/Labs and Tests Ordered: Current medicines are reviewed at length with the patient today.  Concerns regarding medicines are outlined above.  Orders Placed This Encounter  Procedures   EKG 12-Lead   No orders of the defined types were placed in this encounter.   Patient Instructions  Medication Instructions:  The current medical regimen is effective;  continue present plan and medications.  *If you need a refill on your cardiac medications before your next appointment, please call your pharmacy*  Follow-Up: At [facility], you and your health needs are our priority.  As part of our continuing mission to provide you with exceptional heart care, we have created designated Provider Care Teams.  These Care Teams include your primary Cardiologist (physician) and Advanced Practice Providers (APPs -  Physician Assistants and Nurse Practitioners) who all work together to provide you with the care you need, when you need it.  We recommend signing up for the patient portal called "MyChart".  Sign up information is provided on this After Visit Summary.  MyChart is used to connect with patients for Virtual Visits (Telemedicine).  Patients are able to view lab/test results, encounter notes, upcoming appointments, etc.  Non-urgent messages can be sent to your provider as well.   To learn more about what you can do with MyChart, go to [email or web address].    Your next appointment:   1  year(s)  Provider:   Donato Schultz, MD  Signed, Donato Schultz, MD  12/10/2022 5:18 PM     [facility]

## 2022-12-10 NOTE — Patient Instructions (Signed)
Medication Instructions:  The current medical regimen is effective;  continue present plan and medications.  *If you need a refill on your cardiac medications before your next appointment, please call your pharmacy*  Follow-Up: At [facility], you and your health needs are our priority.  As part of our continuing mission to provide you with exceptional heart care, we have created designated Provider Care Teams.  These Care Teams include your primary Cardiologist (physician) and Advanced Practice Providers (APPs -  Physician Assistants and Nurse Practitioners) who all work together to provide you with the care you need, when you need it.  We recommend signing up for the patient portal called "MyChart".  Sign up information is provided on this After Visit Summary.  MyChart is used to connect with patients for Virtual Visits (Telemedicine).  Patients are able to view lab/test results, encounter notes, upcoming appointments, etc.  Non-urgent messages can be sent to your provider as well.   To learn more about what you can do with MyChart, go to [email or web address].    Your next appointment:   1 year(s)  Provider:   Mark Skains, MD      

## 2022-12-22 ENCOUNTER — Ambulatory Visit (INDEPENDENT_AMBULATORY_CARE_PROVIDER_SITE_OTHER): Payer: Medicare Other

## 2022-12-22 VITALS — Ht 62.0 in | Wt 166.0 lb

## 2022-12-22 DIAGNOSIS — Z0000 Encounter for general adult medical examination without abnormal findings: Secondary | ICD-10-CM | POA: Diagnosis not present

## 2022-12-22 DIAGNOSIS — Z1211 Encounter for screening for malignant neoplasm of colon: Secondary | ICD-10-CM

## 2022-12-22 NOTE — Progress Notes (Cosign Needed Addendum)
I connected with  [name] on 12/22/22 by a audio enabled telemedicine application and verified that I am speaking with the correct person using two identifiers.  Patient Location: Home  Provider Location: Office/Clinic  I discussed the limitations of evaluation and management by telemedicine. The patient expressed understanding and agreed to proceed.  Subjective:   Amanda Arnold is a 77 y.o. female who presents for Medicare Annual (Subsequent) preventive examination.  Review of Systems     Cardiac Risk Factors include: advanced age (>64men, >80 women)     Objective:    Today's Vitals   12/22/22 1432  Weight: 166 lb (75.3 kg)  Height: 5\' 2"  (1.575 m)  PainSc: 2   PainLoc: Knee   Body mass index is 30.36 kg/m.     12/22/2022    2:34 PM 08/28/2022    8:32 AM 04/25/2022    8:58 AM 12/16/2021    2:19 PM 08/22/2021    8:57 AM 04/24/2021   11:00 AM 03/27/2021   10:53 AM  Advanced Directives  Does Patient Have a Medical Advance Directive? No No No No No No No  Would patient like information on creating a medical advance directive? No - Patient declined No - Patient declined No - Patient declined No - Patient declined No - Patient declined No - Patient declined No - Patient declined    Current Medications (verified) Outpatient Encounter Medications as of 12/22/2022  Medication Sig   ALPRAZolam (XANAX) 0.5 MG tablet Take 1 tablet (0.5 mg total) by mouth 2 (two) times daily as needed for anxiety. TAKE 1 TABLET(0.5 MG) BY MOUTH TWICE DAILY AS NEEDED FOR ANXIETY   Cholecalciferol (VITAMIN D3) 50 MCG (2000 UT) capsule Take 1 capsule (2,000 Units total) by mouth daily.   Cyanocobalamin (VITAMIN B-12) 1000 MCG SUBL Place 1 tablet (1,000 mcg total) under the tongue daily.   HYDROcodone-acetaminophen (NORCO) 7.5-325 MG tablet Take 1 tablet by mouth every 8 (eight) hours as needed for severe pain.   ibuprofen (ADVIL) 200 MG tablet Take 200 mg by mouth every 6 (six) hours as needed  for mild pain or moderate pain.   promethazine-dextromethorphan (PROMETHAZINE-DM) 6.25-15 MG/5ML syrup Take 5 mLs by mouth 4 (four) times daily as needed for cough.   rosuvastatin (CRESTOR) 10 MG tablet TAKE 1 TABLET(10 MG) BY MOUTH DAILY   No facility-administered encounter medications on file as of 12/22/2022.    Allergies (verified) Morphine and codeine, Bupropion hcl, Ezetimibe, Metformin and related, Naproxen, Trazodone hcl, and Tramadol hcl   History: Past Medical History:  Diagnosis Date   Abdominal pain, epigastric 12/18/2009   ANXIETY 05/25/2006   BRONCHITIS, ACUTE 12/08/2007   Complication of anesthesia     difficulty waking up x 1 - "years ago"   Constipation    COPD 05/25/2006   COVID-19 07/10/2022   DEPRESSION 01/02/2006   ESOTROPIA, LEFT EYE 1948/07/[birth year]   Family history of breast cancer 11/26/2020   FIBROMYALGIA 04/02/2006   GERD (gastroesophageal reflux disease)    Headache(784.0) 12/08/2007   History of blood transfusion    HYPERLIPIDEMIA 04/30/2006   HYPERTENSION 05/25/2006   Hypertension    INSOMNIA, PERSISTENT 02/22/2008   Lazy eye    left - no surgery   LOW BACK PAIN 01/27/2007   Migraines    NEURALGIA, 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/2009   Neuropathy    OBSTRUCTIVE SLEEP APNEA 04/17/2010   does not use CPAP   OSTEOARTHRITIS 05/25/2006   pancreatic ca dx'd 06/2020   Polyuria  Pre-diabetes    diet controlled   Psychosomatic disease 2011   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   T M J 05/11/2009   Past Surgical History:  Procedure Laterality Date   BIOPSY  08/16/2020   Procedure: BIOPSY;  Surgeon: Meridee Score Netty Starring., MD;  Location: [facility] ENDOSCOPY;  Service: Endoscopy;;   CHOLECYSTECTOMY     COLONOSCOPY     ESOPHAGOGASTRODUODENOSCOPY (EGD) WITH PROPOFOL N/A 08/16/2020   Procedure: ESOPHAGOGASTRODUODENOSCOPY (EGD) WITH PROPOFOL;  Surgeon: Lemar Lofty., MD;  Location: [facility] ENDOSCOPY;  Service: Endoscopy;  Laterality: N/A;   EUS N/A 08/16/2020   Procedure:  UPPER ENDOSCOPIC ULTRASOUND (EUS) RADIAL;  Surgeon: Meridee Score Netty Starring., MD;  Location: [facility] ENDOSCOPY;  Service: Endoscopy;  Laterality: N/A;   EYE SURGERY Bilateral    cataract with lens implant   FINE NEEDLE ASPIRATION  08/16/2020   Procedure: FINE NEEDLE ASPIRATION (FNA) LINEAR;  Surgeon: Lemar Lofty., MD;  Location: [facility] ENDOSCOPY;  Service: Endoscopy;;   MASS EXCISION Left 10/06/2016   Procedure: EXCISION LEFT BUTTOCKS MASS;  Surgeon: Ovidio Kin, MD;  Location: [facility];  Service: General;  Laterality: Left;   PORT-A-CATH REMOVAL N/A 06/13/2021   Procedure: PORT REMOVAL;  Surgeon: Almond Lint, MD;  Location: [facility] OR;  Service: General;  Laterality: N/A;   PORTACATH PLACEMENT N/A 11/14/2020   Procedure: INSERTION PORT-A-CATH;  Surgeon: Almond Lint, MD;  Location: [facility] OR;  Service: General;  Laterality: N/A;   XI ROBOTIC ASSISTED LAPAROSCOPIC DISTAL PANCREATECTOMY N/A 10/17/2020   Procedure: 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 colon; dx after 59   Leukemia Brother        d. >50   Skin cancer Daughter        basal cell carcinoma   Colon cancer Neg Hx    Stomach cancer Neg Hx    Social History   Socioeconomic History   Marital status: Married    Spouse name: Not on file   Number of children: 5   Years of education: Not on file   Highest education level: Not on file  Occupational History   Occupation: house wife    Employer: UNEMPLOYED  Tobacco Use   Smoking status: Former    Types: Cigarettes    Quit date: 07/10/2013    Years since quitting: 9.4    Smokeless tobacco: Current    Types: Snuff  Vaping Use   Vaping Use: Never used  Substance and Sexual Activity   Alcohol use: No    Alcohol/week: 0.0 standard drinks of alcohol   Drug use: No   Sexual activity: Not Currently    Birth control/protection: Post-menopausal  Other Topics Concern   Not on file  Social History Narrative   Not on file   Social Determinants of Health   Financial Resource Strain: Low Risk  (12/22/2022)   Overall Financial Resource Strain (CARDIA)    Difficulty of Paying Living Expenses: Not  hard at all  Food Insecurity: No Food Insecurity (12/22/2022)   Hunger Vital Sign    Worried About Running Out of Food in the Last Year: Never true    Ran Out of Food in the Last Year: Never true  Transportation Needs: No Transportation Needs (12/22/2022)   PRAPARE - Administrator, Civil Service (Medical): No    Lack of Transportation (Non-Medical): No  Physical Activity: Insufficiently Active (12/22/2022)   Exercise Vital Sign    Days of Exercise per Week: 7 days    Minutes of Exercise per Session: 20 min  Stress: No Stress Concern Present (12/22/2022)   [employer] of Occupational Health - Occupational Stress Questionnaire    Feeling of Stress : Not at all  Social Connections: Moderately Integrated (12/22/2022)   Social Connection and Isolation Panel [NHANES]    Frequency of Communication with Friends and Family: More than three times a week    Frequency of Social Gatherings with Friends and Family: More than three times a week    Attends Religious Services: More than 4 times per year    Active Member of [employer] or Organizations: Yes    Attends Banker Meetings: More than 4 times per year    Marital Status: Widowed    Tobacco Counseling Ready to quit: Not Answered Counseling given: Not Answered   Clinical Intake:  Pre-visit preparation completed: Yes  Pain : 0-10 Pain Score: 2  Pain Type: Chronic pain Pain Location:  Knee Pain Orientation: Left, Right     BMI - recorded: 30.36 Nutritional Status: BMI > 30  Obese Nutritional Risks: None Diabetes: No  How often do you need to have someone help you when you read instructions, pamphlets, or other written materials from your doctor or pharmacy?: 1 - Never What is the last grade level you completed in school?: HSG  Diabetic? no  Interpreter Needed?: No  Information entered by :: Susie Cassette, LPN.   Activities of Daily Living    12/22/2022    2:37 PM  In your present state of health, do you have any difficulty performing the following activities:  Hearing? 0  Vision? 0  Difficulty concentrating or making decisions? 0  Walking or climbing stairs? 0  Dressing or bathing? 0  Doing errands, shopping? 0  Preparing Food and eating ? N  Using the Toilet? N  In the past six months, have you accidently leaked urine? N  Do you have problems with loss of bowel control? N  Managing your Medications? N  Managing your Finances? N  Housekeeping or managing your Housekeeping? N    Patient Care Team: Plotnikov, Georgina Quint, MD as PCP - General Jake Bathe, MD as PCP - Cardiology (Cardiology) Maeola Harman, MD as Consulting Physician (Neurosurgery) Jake Bathe, MD as Consulting Physician (Cardiology) Ladene Artist, MD as Consulting Physician (Oncology) Almond Lint, MD as Consulting Physician (General Surgery) Szabat, Vinnie Level, [facility] (Inactive) as Pharmacist (Pharmacist) Janet Berlin, MD as Consulting Physician (Ophthalmology)  Indicate any recent Medical Services you may have received from other than [facility] providers in the past year (date may be approximate).     Assessment:   This is a routine wellness examination for Jamaira.  Hearing/Vision screen Hearing Screening - Comments:: Denies hearing difficulties.  Vision Screening - Comments:: Wears rx glasses - up to date with routine eye exams with Janet Berlin, MD.   Dietary  issues and exercise activities discussed: Current Exercise Habits: Home exercise routine,  Type of exercise: walking, Time (Minutes): 25, Frequency (Times/Week): 7, Weekly Exercise (Minutes/Week): 175, Intensity: Mild, Exercise limited by: orthopedic condition(s);respiratory conditions(s)   Goals Addressed             This Visit's Progress    My goal for 2024 is to stay healthy.       Get colonoscopy done this year. Get bone density scheduled with mammogram for this year.      Depression Screen    12/22/2022    2:36 PM 09/09/2022    9:53 AM 07/10/2022   10:44 AM 12/16/2021    2:13 PM 11/18/2021    2:40 PM 12/12/2020    3:08 PM 11/01/2019   11:19 AM  PHQ 2/9 Scores  PHQ - 2 Score 1 0 0 0 0 2 0  PHQ- 9 Score 3  0        Fall Risk    12/22/2022    2:35 PM 09/09/2022    9:53 AM 07/10/2022   10:44 AM 12/16/2021    2:17 PM 11/18/2021    2:40 PM  Fall Risk   Falls in the past year? 0 0 0 0 0  Number falls in past yr: 0 0 0 0 0  Injury with Fall? 0 0 0 0 0  Risk for fall due to : No Fall Risks No Fall Risks No Fall Risks No Fall Risks No Fall Risks  Follow up Falls prevention discussed Falls evaluation completed Falls evaluation completed  Falls evaluation completed    FALL RISK PREVENTION PERTAINING TO THE HOME:  Any stairs in or around the home? No  If so, are there any without handrails? No  Home free of loose throw rugs in walkways, pet beds, electrical cords, etc? Yes  Adequate lighting in your home to reduce risk of falls? Yes   ASSISTIVE DEVICES UTILIZED TO PREVENT FALLS:  Life alert? No  Use of a cane, walker or w/c? Yes  Grab bars in the bathroom? Yes  Shower chair or bench in shower? Yes  Elevated toilet seat or a handicapped toilet? No   TIMED UP AND GO:  Was the test performed? No . Telephonic Visit   Cognitive Function:        12/22/2022    2:38 PM 12/16/2021    2:19 PM  6CIT Screen  What Year? 0 points 0 points  What month? 0 points 0 points  What time?  0 points 0 points  Count back from 20 0 points 0 points  Months in reverse 0 points 2 points  Repeat phrase 0 points 4 points  Total Score 0 points 6 points    Immunizations Immunization History  Administered Date(s) Administered   HIB (PRP-OMP) 09/04/2020   Meningococcal B, Unspecified 09/04/2020   Pneumococcal Conjugate-13 06/27/2016   Pneumococcal Polysaccharide-23 04/14/2005, 09/17/2017, 09/04/2020   Tdap 04/28/2019    TDAP status: Up to date  Flu Vaccine status: Declined, Education has been provided regarding the importance of this vaccine but patient still declined. Advised may receive this vaccine at local pharmacy or Health Dept. Aware to provide a copy of the vaccination record if obtained from local pharmacy or Health Dept. Verbalized acceptance and understanding.  Pneumococcal vaccine status: Up to date  Covid-19 vaccine status: Declined, Education has been provided regarding the importance of this vaccine but patient still declined. Advised may receive this vaccine at local pharmacy or Health Dept.or vaccine clinic. Aware to provide a copy of the vaccination record if obtained from  local pharmacy or Health Dept. Verbalized acceptance and understanding.  Qualifies for Shingles Vaccine? Yes   Zostavax completed No   Shingrix Completed?: No.    Education has been provided regarding the importance of this vaccine. Patient has been advised to call insurance company to determine out of pocket expense if they have not yet received this vaccine. Advised may also receive vaccine at local pharmacy or Health Dept. Verbalized acceptance and understanding.  Screening Tests Health Maintenance  Topic Date Due   Diabetic kidney evaluation - Urine ACR  Never done   Hepatitis C Screening  Never done   Colonoscopy  08/29/2019   FOOT EXAM  10/28/2019   HEMOGLOBIN A1C  03/10/2023   Diabetic kidney evaluation - eGFR measurement  09/10/2023   OPHTHALMOLOGY EXAM  11/04/2023   Medicare  Annual Wellness (AWV)  12/22/2023   MAMMOGRAM  03/07/2024   DTaP/Tdap/Td (2 - Td or Tdap) 04/27/2029   Pneumonia Vaccine 56+ Years old  Completed   DEXA SCAN  Completed   HPV VACCINES  Aged Out   INFLUENZA VACCINE  Discontinued   COVID-19 Vaccine  Discontinued   Zoster Vaccines- Shingrix  Discontinued    Health Maintenance  Health Maintenance Due  Topic Date Due   Diabetic kidney evaluation - Urine ACR  Never done   Hepatitis C Screening  Never done   Colonoscopy  08/29/2019   FOOT EXAM  10/28/2019    Colorectal cancer screening: Referral to GI placed 12/22/2022. Pt aware the office will call re: appt.  Mammogram status: Completed 03/07/2022. Repeat every year  Bone Density status: Completed 11/14/2016. Results reflect: Bone density results: OSTEOPENIA. Repeat every 2-3 years. Patient would like to wait on referral for bone density until after colonoscopy.  Lung Cancer Screening: (Low Dose CT Chest recommended if Age 45-80 years, 30 pack-year currently smoking OR have quit w/in 15years.) does not qualify.   Lung Cancer Screening Referral: no  Additional Screening:  Hepatitis C Screening: does qualify; Completed; no  Vision Screening: Recommended annual ophthalmology exams for early detection of glaucoma and other disorders of the eye. Is the patient up to date with their annual eye exam?  Yes  Who is the provider or what is the name of the office in which the patient attends annual eye exams? Janet Berlin, MD. If pt is not established with a provider, would they like to be referred to a provider to establish care? No .   Dental Screening: Recommended annual dental exams for proper oral hygiene  Community Resource Referral / Chronic Care Management: CRR required this visit?  No   CCM required this visit?  No      Plan:     I have personally reviewed and noted the following in the patient's chart:   Medical and social history Use of alcohol, tobacco or illicit drugs   Current medications and supplements including opioid prescriptions. Patient is currently taking opioid prescriptions. Information provided to patient regarding non-opioid alternatives. Patient advised to discuss non-opioid treatment plan with their provider. Functional ability and status Nutritional status Physical activity Advanced directives List of other physicians Hospitalizations, surgeries, and ER visits in previous 12 months Vitals Screenings to include cognitive, depression, and falls Referrals and appointments  In addition, I have reviewed and discussed with patient certain preventive protocols, quality metrics, and best practice recommendations. A written personalized care plan for preventive services as well as general preventive health recommendations were provided to patient.     Mickeal Needy, LPN  12/22/2022   Nurse Notes: Normal cognitive status assessed by direct observation via telephone conversation by this Nurse Health Advisor. No abnormalities found.   Medical screening examination/treatment/procedure(s) were performed by non-physician practitioner and as supervising physician I was immediately available for consultation/collaboration.  I agree with above. Jacinta Shoe, MD

## 2022-12-22 NOTE — Patient Instructions (Addendum)
Amanda Arnold , Thank you for taking time to come for your Medicare Wellness Visit. I appreciate your ongoing commitment to your health goals. Please review the following plan we discussed and let me know if I can assist you in the future.   These are the goals we discussed:  Goals      My goal for 2024 is to stay healthy.     Get colonoscopy done this year. Get bone density scheduled with mammogram for this year.        This is a list of the screening recommended for you and due dates:  Health Maintenance  Topic Date Due   Yearly kidney health urinalysis for diabetes  Never done   Hepatitis C Screening  Never done   Colon Cancer Screening  08/29/2019   Complete foot exam   10/28/2019   Hemoglobin A1C  03/10/2023   Yearly kidney function blood test for diabetes  09/10/2023   Eye exam for diabetics  11/04/2023   Medicare Annual Wellness Visit  12/22/2023   Mammogram  03/07/2024   DTaP/Tdap/Td vaccine (2 - Td or Tdap) 04/27/2029   Pneumonia Vaccine  Completed   DEXA scan (bone density measurement)  Completed   HPV Vaccine  Aged Out   Flu Shot  Discontinued   COVID-19 Vaccine  Discontinued   Zoster (Shingles) Vaccine  Discontinued    Advanced directives: No  Conditions/risks identified: Yes  Next appointment: Please schedule your next Medicare Wellness Visit with your Nurse Health Advisor in 1 year by calling [phone].  Preventive Care 47 Years and Older, Female Preventive care refers to lifestyle choices and visits with your health care provider that can promote health and wellness. What does preventive care include? A yearly physical exam. This is also called an annual well check. Dental exams once or twice a year. Routine eye exams. Ask your health care provider how often you should have your eyes checked. Personal lifestyle choices, including: Daily care of your teeth and gums. Regular physical activity. Eating a healthy diet. Avoiding tobacco and drug  use. Limiting alcohol use. Practicing safe sex. Taking low-dose aspirin every day. Taking vitamin and mineral supplements as recommended by your health care provider. What happens during an annual well check? The services and screenings done by your health care provider during your annual well check will depend on your age, overall health, lifestyle risk factors, and family history of disease. Counseling  Your health care provider may ask you questions about your: Alcohol use. Tobacco use. Drug use. Emotional well-being. Home and relationship well-being. Sexual activity. Eating habits. History of falls. Memory and ability to understand (cognition). Work and work Astronomer. Reproductive health. Screening  You may have the following tests or measurements: Height, weight, and BMI. Blood pressure. Lipid and cholesterol levels. These may be checked every 5 years, or more frequently if you are over 67 years old. Skin check. Lung cancer screening. You may have this screening every year starting at age 64 if you have a 30-pack-year history of smoking and currently smoke or have quit within the past 15 years. Fecal occult blood test (FOBT) of the stool. You may have this test every year starting at age 27. Flexible sigmoidoscopy or colonoscopy. You may have a sigmoidoscopy every 5 years or a colonoscopy every 10 years starting at age 87. Hepatitis C blood test. Hepatitis B blood test. Sexually transmitted disease (STD) testing. Diabetes screening. This is done by checking your blood sugar (glucose) after you have not  eaten for a while (fasting). You may have this done every 1-3 years. Bone density scan. This is done to screen for osteoporosis. You may have this done starting at age 67. Mammogram. This may be done every 1-2 years. Talk to your health care provider about how often you should have regular mammograms. Talk with your health care provider about your test results, treatment  options, and if necessary, the need for more tests. Vaccines  Your health care provider may recommend certain vaccines, such as: Influenza vaccine. This is recommended every year. Tetanus, diphtheria, and acellular pertussis (Tdap, Td) vaccine. You may need a Td booster every 10 years. Zoster vaccine. You may need this after age 38. Pneumococcal 13-valent conjugate (PCV13) vaccine. One dose is recommended after age 52. Pneumococcal polysaccharide (PPSV23) vaccine. One dose is recommended after age 9. Talk to your health care provider about which screenings and vaccines you need and how often you need them. This information is not intended to replace advice given to you by your health care provider. Make sure you discuss any questions you have with your health care provider. Document Released: 07/27/2015 Document Revised: 03/19/2016 Document Reviewed: 05/01/2015 Elsevier Interactive Patient Education  2017 [employer].  Fall Prevention in the Home Falls can cause injuries. They can happen to people of all ages. There are many things you can do to make your home safe and to help prevent falls. What can I do on the outside of my home? Regularly fix the edges of walkways and driveways and fix any cracks. Remove anything that might make you trip as you walk through a door, such as a raised step or threshold. Trim any bushes or trees on the path to your home. Use bright outdoor lighting. Clear any walking paths of anything that might make someone trip, such as rocks or tools. Regularly check to see if handrails are loose or broken. Make sure that both sides of any steps have handrails. Any raised decks and porches should have guardrails on the edges. Have any leaves, snow, or ice cleared regularly. Use sand or salt on walking paths during winter. Clean up any spills in your garage right away. This includes oil or grease spills. What can I do in the bathroom? Use night lights. Install grab  bars by the toilet and in the tub and shower. Do not use towel bars as grab bars. Use non-skid mats or decals in the tub or shower. If you need to sit down in the shower, use a plastic, non-slip stool. Keep the floor dry. Clean up any water that spills on the floor as soon as it happens. Remove soap buildup in the tub or shower regularly. Attach bath mats securely with double-sided non-slip rug tape. Do not have throw rugs and other things on the floor that can make you trip. What can I do in the bedroom? Use night lights. Make sure that you have a light by your bed that is easy to reach. Do not use any sheets or blankets that are too big for your bed. They should not hang down onto the floor. Have a firm chair that has side arms. You can use this for support while you get dressed. Do not have throw rugs and other things on the floor that can make you trip. What can I do in the kitchen? Clean up any spills right away. Avoid walking on wet floors. Keep items that you use a lot in easy-to-reach places. If you need to reach  something above you, use a strong step stool that has a grab bar. Keep electrical cords out of the way. Do not use floor polish or wax that makes floors slippery. If you must use wax, use non-skid floor wax. Do not have throw rugs and other things on the floor that can make you trip. What can I do with my stairs? Do not leave any items on the stairs. Make sure that there are handrails on both sides of the stairs and use them. Fix handrails that are broken or loose. Make sure that handrails are as long as the stairways. Check any carpeting to make sure that it is firmly attached to the stairs. Fix any carpet that is loose or worn. Avoid having throw rugs at the top or bottom of the stairs. If you do have throw rugs, attach them to the floor with carpet tape. Make sure that you have a light switch at the top of the stairs and the bottom of the stairs. If you do not have them,  ask someone to add them for you. What else can I do to help prevent falls? Wear shoes that: Do not have high heels. Have rubber bottoms. Are comfortable and fit you well. Are closed at the toe. Do not wear sandals. If you use a stepladder: Make sure that it is fully opened. Do not climb a closed stepladder. Make sure that both sides of the stepladder are locked into place. Ask someone to hold it for you, if possible. Clearly mark and make sure that you can see: Any grab bars or handrails. First and last steps. Where the edge of each step is. Use tools that help you move around (mobility aids) if they are needed. These include: Canes. Walkers. Scooters. Crutches. Turn on the lights when you go into a dark area. Replace any light bulbs as soon as they burn out. Set up your furniture so you have a clear path. Avoid moving your furniture around. If any of your floors are uneven, fix them. If there are any pets around you, be aware of where they are. Review your medicines with your doctor. Some medicines can make you feel dizzy. This can increase your chance of falling. Ask your doctor what other things that you can do to help prevent falls. This information is not intended to replace advice given to you by your health care provider. Make sure you discuss any questions you have with your health care provider. Document Released: 04/26/2009 Document Revised: 12/06/2015 Document Reviewed: 08/04/2014 Elsevier Interactive Patient Education  2017 [employer].

## 2023-01-23 ENCOUNTER — Inpatient Hospital Stay (HOSPITAL_BASED_OUTPATIENT_CLINIC_OR_DEPARTMENT_OTHER): Payer: Medicare Other | Admitting: Oncology

## 2023-01-23 ENCOUNTER — Inpatient Hospital Stay: Payer: Medicare Other | Attending: Oncology

## 2023-01-23 VITALS — BP 119/70 | HR 69 | Temp 98.0°F | Resp 18 | Ht 62.0 in | Wt 169.2 lb

## 2023-01-23 DIAGNOSIS — C252 Malignant neoplasm of tail of pancreas: Secondary | ICD-10-CM | POA: Diagnosis not present

## 2023-01-23 DIAGNOSIS — Z87891 Personal history of nicotine dependence: Secondary | ICD-10-CM | POA: Diagnosis not present

## 2023-01-23 DIAGNOSIS — Z8507 Personal history of malignant neoplasm of pancreas: Secondary | ICD-10-CM | POA: Diagnosis not present

## 2023-01-23 DIAGNOSIS — I10 Essential (primary) hypertension: Secondary | ICD-10-CM | POA: Insufficient documentation

## 2023-01-23 DIAGNOSIS — J449 Chronic obstructive pulmonary disease, unspecified: Secondary | ICD-10-CM | POA: Insufficient documentation

## 2023-01-23 DIAGNOSIS — Z9221 Personal history of antineoplastic chemotherapy: Secondary | ICD-10-CM | POA: Insufficient documentation

## 2023-01-23 NOTE — Progress Notes (Signed)
Amanda Arnold OFFICE PROGRESS NOTE   Diagnosis: Pancreas cancer  INTERVAL HISTORY:   Amanda Arnold returns as scheduled.  She feels well.  Good appetite.  No abdominal pain.  She reports bilateral knee pain.  No other complaint.  Objective:  Vital signs in last 24 hours:  Blood pressure 119/70, pulse 69, temperature 98 F (36.7 C), temperature source Oral, resp. rate 18, height 5\' 2"  (1.575 m), weight 169 lb 3.2 oz (76.7 kg), SpO2 97%.    Lymphatics: No cervical, supraclavicular, axillary, or inguinal nodes Resp: Lungs clear bilaterally, no respiratory distress Cardio: Regular rate and rhythm GI: Nontender, no hepatosplenomegaly, no mass, no apparent ascites Vascular: No leg edema   Lab Results:  Lab Results  Component Value Date   WBC 7.5 09/09/2022   HGB 14.1 09/09/2022   HCT 43.0 09/09/2022   MCV 95.0 09/09/2022   PLT 319.0 09/09/2022   NEUTROABS 3.6 09/09/2022    CMP  Lab Results  Component Value Date   NA 140 09/09/2022   K 4.3 09/09/2022   CL 104 09/09/2022   CO2 29 09/09/2022   GLUCOSE 140 (H) 09/09/2022   BUN 15 09/09/2022   CREATININE 0.60 09/09/2022   CALCIUM 9.4 09/09/2022   PROT 6.8 09/09/2022   ALBUMIN 3.9 09/09/2022   AST 22 09/09/2022   ALT 12 09/09/2022   ALKPHOS 92 09/09/2022   BILITOT 0.7 09/09/2022   GFRNONAA >60 05/22/2021   GFRAA 103 04/20/2020    Lab Results  Component Value Date   NWG956 10 08/28/2022     Medications: I have reviewed the patient's current medications.   Assessment/Plan: Pancreas cancer-stage IIb (pT2pN1) CT abdomen/pelvis 06/01/2020-2.9 x 3 cm pancreas tail mass, slight increase in size of 7 mm right middle lobe nodule MRI/MRCP 06/22/2020-solid 3.8 x 3 x 3.1 cm pancreas tail mass with focal occlusion of the splenic vein and dilated venous collaterals, no evidence of metastatic disease, bilateral adrenal adenomas CT renal stone study 08/02/2020-scattered airspace opacities throughout the lung  bases-new favored to represent infectious/inflammatory changes EUS 08/16/2020-pancreas tail mass, T2N0, FNA biopsy-suspicious for malignancy CTs 08/27/2020-unchanged right middle lobe nodule, superficial left upper back lesion, no change in pancreas tail mass, occluded splenic vein with collaterals at the splenic hilum, no enlarged abdominal pelvic lymph nodes Distal pancreatectomy/splenectomy 10/17/2020- 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  2/15 lymph nodes positive, positive lymphovascular and perineural invasion, resection margins negative, pT2pN1 Cycle 1 gemcitabine/capecitabine 11/21/2020, day 15 gemcitabine 12/05/2020 Cycle 2 gemcitabine/capecitabine 12/20/2020, day 15 gemcitabine 01/03/2021 Cycle 3 gemcitabine/capecitabine 01/16/2021, day 15 gemcitabine 01/30/2021 Cycle 4 gemcitabine/capecitabine 02/13/2021, day 15 gemcitabine 02/27/2021 Cycle 5 gemcitabine/capecitabine 03/13/2021 (capecitabine dose reduced secondary to hand/foot syndrome), day 15 gemcitabine 03/27/2021 Cycle 6 gemcitabine/capecitabine 04/10/2021, day 15 gemcitabine 04/24/2021 CT abdomen/pelvis 07/26/2021-slight increase in size of a cystic lesion at the distal pancreatic tail/stump.  Additional fluid collections at the surgical bed decreased in size. MRI abdomen 02/12/2022-unchanged nonenhancing fluid signal cyst at the distal pancreatic resection margin without enhancement consistent with a postoperative seroma or pseudocyst, no evidence of metastatic disease   2.   Gastric ulcer on EUS 08/16/2020-H. pylori positive 3.   COVID-19 infection January 2022 4.   COPD 5.   Hypertension 6.   Right abdomen/right leg pain-musculoskeletal? 7.   Right middle lobe nodule on CT 06/01/2020, increased from 2017, stable on CT 11/13/2020, stable on CT 04/23/2021 8.   History of tobacco use 9.   Superficial  left upper back lesion noted on CT 08/27/2020       Disposition: [name] is in clinical remission from pancreas cancer.  We will follow-up on the CA 19-9 from today.  She has now 2 and half years out from diagnosis.  She will return for an office visit in 6 months.  Thornton Papas, MD  01/23/2023  9:55 AM

## 2023-01-25 LAB — CANCER ANTIGEN 19-9: CA 19-9: 10 U/mL (ref 0–35)

## 2023-01-26 ENCOUNTER — Telehealth: Payer: Self-pay

## 2023-01-26 ENCOUNTER — Other Ambulatory Visit: Payer: Self-pay | Admitting: Cardiology

## 2023-01-26 NOTE — Telephone Encounter (Signed)
-----   Message from Thornton Papas sent at 01/25/2023 11:48 AM EDT ----- Please call patient, ca19-9 is normal, f/u as scheduled

## 2023-01-26 NOTE — Telephone Encounter (Signed)
Patient gave verbal understanding and had no further questions or concerns  

## 2023-02-12 ENCOUNTER — Ambulatory Visit: Payer: Medicare Other | Admitting: Cardiology

## 2023-02-25 ENCOUNTER — Ambulatory Visit (INDEPENDENT_AMBULATORY_CARE_PROVIDER_SITE_OTHER): Payer: Medicare Other | Admitting: Emergency Medicine

## 2023-02-25 ENCOUNTER — Encounter: Payer: Self-pay | Admitting: Emergency Medicine

## 2023-02-25 VITALS — BP 114/62 | HR 77 | Temp 98.5°F | Ht 62.0 in | Wt 168.2 lb

## 2023-02-25 DIAGNOSIS — H6121 Impacted cerumen, right ear: Secondary | ICD-10-CM | POA: Diagnosis not present

## 2023-02-25 DIAGNOSIS — R350 Frequency of micturition: Secondary | ICD-10-CM

## 2023-02-25 LAB — POCT URINALYSIS DIPSTICK
Bilirubin, UA: NEGATIVE
Blood, UA: NEGATIVE
Glucose, UA: NEGATIVE
Ketones, UA: NEGATIVE
Nitrite, UA: NEGATIVE
Protein, UA: NEGATIVE
Spec Grav, UA: >=1.03 — AB (ref 1.010–1.025)
Urobilinogen, UA: 0.2 E.U./dL
pH, UA: 5.5 (ref 5.0–8.0)

## 2023-02-25 MED ORDER — CEFUROXIME AXETIL 250 MG PO TABS
250.0000 mg | ORAL_TABLET | Freq: Two times a day (BID) | ORAL | 0 refills | Status: AC
Start: 2023-02-25 — End: 2023-03-04

## 2023-02-25 NOTE — Progress Notes (Signed)
Amanda Arnold 77 y.o.   Chief Complaint  Patient presents with   Ear Pain    Right ear pain x 1 week, can't hear out of it and clogged    Urinary Frequency    Patient is having some urinary frequency, no pain, lower back pain, x 1 week     HISTORY OF PRESENT ILLNESS: Acute problem visit today.  Patient of Dr. Sonda Primes This is a 77 y.o. female complaining of clogging of right ear and unable to hear out of it. Also has some urinary frequency without any other associated symptoms  Urinary Frequency  Associated symptoms include frequency. Pertinent negatives include no chills, nausea or vomiting.     Prior to Admission medications   Medication Sig Start Date End Date Taking? Authorizing Provider  ALPRAZolam Prudy Feeler) 0.5 MG tablet Take 1 tablet (0.5 mg total) by mouth 2 (two) times daily as needed for anxiety. TAKE 1 TABLET(0.5 MG) BY MOUTH TWICE DAILY AS NEEDED FOR ANXIETY 07/10/22  Yes Plotnikov, Georgina Quint, MD  Cholecalciferol (VITAMIN D3) 50 MCG (2000 UT) capsule Take 1 capsule (2,000 Units total) by mouth daily. 10/08/21  Yes Plotnikov, Georgina Quint, MD  Cyanocobalamin (VITAMIN B-12) 1000 MCG SUBL Place 1 tablet (1,000 mcg total) under the tongue daily. 10/08/21  Yes Plotnikov, Georgina Quint, MD  HYDROcodone-acetaminophen (NORCO) 7.5-325 MG tablet Take 1 tablet by mouth every 8 (eight) hours as needed for severe pain. 07/10/22  Yes Plotnikov, Georgina Quint, MD  ibuprofen (ADVIL) 200 MG tablet Take 200 mg by mouth every 6 (six) hours as needed for mild pain or moderate pain.   Yes [provider]  rosuvastatin (CRESTOR) 10 MG tablet TAKE 1 TABLET(10 MG) BY MOUTH DAILY 01/27/23  Yes Jake Bathe, MD    Allergies  Allergen Reactions   Morphine And Codeine Shortness Of Breath   Bupropion Hcl Other (See Comments)    "does not feel right"   Ezetimibe Other (See Comments)    REACTION: legs burning   Metformin And Related Diarrhea    Diarrhea w/XR or regular    Naproxen     Upset  stomach, diarrhea   Trazodone Hcl Swelling   Tramadol Hcl Palpitations    REACTION: palpitations    Patient Active Problem List   Diagnosis Date Noted   Hip pain, chronic, right 09/09/2022   COVID-19 07/10/2022   Low vitamin B12 level 04/09/2022   Hearing loss of right ear due to cerumen impaction 02/18/2022   Upper back pain on right side 02/18/2022   Right hip pain 01/24/2022   Pruritus of vulva 10/07/2021   Symptoms involving urinary system 10/07/2021   Grief at loss of child 07/02/2021   Hyperglycemia 07/02/2021   Hypotension 04/15/2021   Urinary frequency 04/15/2021   Genetic testing 12/04/2020   Family history of breast cancer 11/26/2020   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   Malnutrition of moderate degree 10/19/2020   Weight loss 09/04/2020   Cancer of pancreas, tail (HCC) 08/28/2020   Shoulder pain, right 07/10/2020   Coronary atherosclerosis 05/28/2020   Pulmonary nodule 05/28/2020   Palpitations 02/16/2020   Dysuria 07/27/2019   Piriformis syndrome 03/29/2019   Humerus fracture 01/27/2019   Closed fracture of left proximal humerus 12/07/2018   Trochanteric bursitis of right hip 07/12/2018   Trochanteric bursitis of both hips 03/23/2018   Neck pain 02/11/2018   Peroneal mononeuropathy, left 01/26/2017   Skin cyst 09/26/2016   Adjustment disorder with mixed anxiety and depressed mood 06/27/2016  Fatigue 03/28/2016   Restless leg syndrome 03/28/2016   Right lower quadrant abdominal pain 08/15/2015   Well adult exam 07/03/2015   Meralgia paraesthetica 05/29/2015   Paresthesia 05/15/2015   Sciatic leg pain 09/22/2014   Rash and nonspecific skin eruption 09/11/2014   Right lower quadrant pain 09/11/2014   Nonspecific abnormal electrocardiogram (ECG) (EKG) 07/27/2014   Precordial pain 07/27/2014   Cerumen impaction 06/16/2014   Chest pain, atypical 03/03/2014   UTI (urinary tract infection) 03/29/2013   Herpes zoster 03/02/2013   Abscess of left leg 02/02/2013    Leg pain, bilateral 12/09/2012   Left groin pain 09/09/2012   Unspecified sinusitis (chronic) 08/25/2012   Chest wall pain 05/20/2012   Nocturia 12/24/2011   Psychosomatic pain 10/14/2011   Left shoulder pain 08/22/2011   Vertigo 06/13/2011   Syncopal vertigo 06/13/2011   Intertriginous candidiasis 03/07/2011   Diabetes mellitus type 2 in obese 03/07/2011   HIP PAIN 06/17/2010   HYPERGLYCEMIA 05/15/2010   CENTRAL SLEEP APNEA CONDS CLASSIFIED ELSEWHERE 05/14/2010   Obstructive sleep apnea 04/17/2010   Obesity 04/10/2010   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   DYSPNEA 03/15/2010   TRIGGER FINGER 12/18/2009   Abdominal pain, epigastric 12/18/2009   KNEE PAIN 07/11/2009   NEURALGIA, 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/2009   COLD SORE 05/11/2009   T M J 05/11/2009   BREAST PAIN 04/13/2009   Edema 03/01/2009   CERVICAL LYMPHADENOPATHY 03/01/2009   PRURITUS 07/28/2008   TACHYCARDIA 07/28/2008   INSOMNIA, PERSISTENT 02/22/2008   Cystitis 02/22/2008   SWEATING 01/18/2008   BRONCHITIS, ACUTE 12/08/2007   Chronic fatigue 12/08/2007   Headache(784.0) 12/08/2007   Cough 12/08/2007   ARTHRALGIA 11/19/2007   LOW BACK PAIN 01/27/2007   Tobacco use disorder 11/28/2006   Generalized anxiety disorder 05/25/2006   Essential hypertension 05/25/2006   COPD exacerbation (HCC) 05/25/2006   Osteoarthritis 05/25/2006   Polyuria 05/25/2006   Dyslipidemia 04/30/2006   Myalgia 04/02/2006   Anxiety and depression 01/02/2006   Disturbance of skin sensation 05/29/2005   ESOTROPIA, LEFT EYE 08/04/47    Past Medical History:  Diagnosis Date   Abdominal pain, epigastric 12/18/2009   ANXIETY 05/25/2006   BRONCHITIS, ACUTE 12/08/2007   Complication of anesthesia     difficulty waking up x 1 - "years ago"   Constipation    COPD 05/25/2006   COVID-19 07/10/2022   DEPRESSION 01/02/2006   ESOTROPIA, LEFT EYE 09/21/47   Family history of breast cancer 11/26/2020   FIBROMYALGIA 04/02/2006   GERD  (gastroesophageal reflux disease)    Headache(784.0) 12/08/2007   History of blood transfusion    HYPERLIPIDEMIA 04/30/2006   HYPERTENSION 05/25/2006   Hypertension    INSOMNIA, PERSISTENT 02/22/2008   Lazy eye    left - no surgery   LOW BACK PAIN 01/27/2007   Migraines    NEURALGIA, 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/2009   Neuropathy    OBSTRUCTIVE SLEEP APNEA 04/17/2010   does not use CPAP   OSTEOARTHRITIS 05/25/2006   pancreatic ca dx'd 06/2020   Polyuria    Pre-diabetes    diet controlled   Psychosomatic disease 2011   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   T M J 05/11/2009    Past Surgical History:  Procedure Laterality Date   BIOPSY  08/16/2020   Procedure: BIOPSY;  Surgeon: Lemar Lofty., MD;  Location: [facility] ENDOSCOPY;  Service: Endoscopy;;   CHOLECYSTECTOMY     COLONOSCOPY     ESOPHAGOGASTRODUODENOSCOPY (EGD) WITH PROPOFOL N/A 08/16/2020   Procedure: ESOPHAGOGASTRODUODENOSCOPY (EGD) WITH PROPOFOL;  Surgeon: Corliss Parish  Montez Hageman., MD;  Location: [facility] ENDOSCOPY;  Service: Endoscopy;  Laterality: N/A;   EUS N/A 08/16/2020   Procedure: UPPER ENDOSCOPIC ULTRASOUND (EUS) RADIAL;  Surgeon: Meridee Score Netty Starring., MD;  Location: [facility] ENDOSCOPY;  Service: Endoscopy;  Laterality: N/A;   EYE SURGERY Bilateral    cataract with lens implant   FINE NEEDLE ASPIRATION  08/16/2020   Procedure: FINE NEEDLE ASPIRATION (FNA) LINEAR;  Surgeon: Lemar Lofty., MD;  Location: [facility] ENDOSCOPY;  Service: Endoscopy;;   MASS EXCISION Left 10/06/2016   Procedure: EXCISION LEFT BUTTOCKS MASS;  Surgeon: Ovidio Kin, MD;  Location: [facility];  Service: General;  Laterality: Left;   PORT-A-CATH REMOVAL N/A 06/13/2021   Procedure: PORT REMOVAL;  Surgeon: Almond Lint, MD;  Location: [facility] OR;  Service: General;  Laterality: N/A;   PORTACATH PLACEMENT N/A 11/14/2020   Procedure: INSERTION PORT-A-CATH;  Surgeon: Almond Lint, MD;  Location: [facility] OR;  Service: General;  Laterality: N/A;   XI ROBOTIC  ASSISTED LAPAROSCOPIC DISTAL PANCREATECTOMY N/A 10/17/2020   Procedure: XI ROBOTIC ASSISTED DISTAL PANCREATECTOMY AND SPLENECTOMY;  Surgeon: Almond Lint, MD;  Location: [facility] OR;  Service: General;  Laterality: N/A;    Social History   Socioeconomic History   Marital status: Married    Spouse name: Not on file   Number of children: 5   Years of education: Not on file   Highest education level: Not on file  Occupational History   Occupation: house wife    Employer: UNEMPLOYED  Tobacco Use   Smoking status: Former    Current packs/day: 0.00    Types: Cigarettes    Quit date: 07/10/2013    Years since quitting: 9.6   Smokeless tobacco: Current    Types: Snuff  Vaping Use   Vaping status: Never Used  Substance and Sexual Activity   Alcohol use: No    Alcohol/week: 0.0 standard drinks of alcohol   Drug use: No   Sexual activity: Not Currently    Birth control/protection: Post-menopausal  Other Topics Concern   Not on file  Social History Narrative   Not on file   Social Determinants of Health   Financial Resource Strain: Low Risk  (12/22/2022)   Overall Financial Resource Strain (CARDIA)    Difficulty of Paying Living Expenses: Not hard at all  Food Insecurity: No Food Insecurity (12/22/2022)   Hunger Vital Sign    Worried About Running Out of Food in the Last Year: Never true    Ran Out of Food in the Last Year: Never true  Transportation Needs: No Transportation Needs (12/22/2022)   PRAPARE - Administrator, Civil Service (Medical): No    Lack of Transportation (Non-Medical): No  Physical Activity: Insufficiently Active (12/22/2022)   Exercise Vital Sign    Days of Exercise per Week: 7 days    Minutes of Exercise per Session: 20 min  Stress: No Stress Concern Present (12/22/2022)   [employer] of Occupational Health - Occupational Stress Questionnaire    Feeling of Stress : Not at all  Social Connections: Socially Integrated (12/22/2022)   Social  Connection and Isolation Panel [NHANES]    Frequency of Communication with Friends and Family: More than three times a week    Frequency of Social Gatherings with Friends and Family: More than three times a week    Attends Religious Services: More than 4 times per year    Active Member of [employer] or Organizations: Yes    Attends Banker Meetings:  More than 4 times per year    Marital Status: Married  Catering manager Violence: Not At Risk (12/22/2022)   Humiliation, Afraid, Rape, and Kick questionnaire    Fear of Current or Ex-Partner: No    Emotionally Abused: No    Physically Abused: No    Sexually Abused: No    Family History  Problem Relation Age of Onset   Heart attack Father    Hypertension Brother    Heart disease Mother    Cervical cancer Mother        dx unknown age   Skin cancer Mother        dx unknown age   Breast cancer Maternal Aunt        dx after 71   Cancer Maternal Uncle        ? colon; dx after 65   Leukemia Brother        d. >50   Skin cancer Daughter        basal cell carcinoma   Colon cancer Neg Hx    Stomach cancer Neg Hx      Review of Systems  Constitutional: Negative.  Negative for chills and fever.  HENT:  Positive for hearing loss. Negative for congestion, ear pain and sore throat.   Respiratory: Negative.  Negative for cough and shortness of breath.   Cardiovascular: Negative.  Negative for chest pain and palpitations.  Gastrointestinal:  Negative for abdominal pain, nausea and vomiting.  Genitourinary:  Positive for frequency.  Skin: Negative.   Neurological: Negative.  Negative for dizziness and headaches.  All other systems reviewed and are negative.   Vitals:   02/25/23 1331  BP: 114/62  Pulse: 77  Temp: 98.5 F (36.9 C)  SpO2: 93%    Physical Exam Constitutional:      Appearance: Normal appearance.  HENT:     Head: Normocephalic.     Right Ear: There is impacted cerumen.     Mouth/Throat:     Mouth: Mucous  membranes are moist.     Pharynx: Oropharynx is clear.  Eyes:     Extraocular Movements: Extraocular movements intact.  Cardiovascular:     Rate and Rhythm: Normal rate.  Pulmonary:     Effort: Pulmonary effort is normal.  Musculoskeletal:     Cervical back: No tenderness.  Lymphadenopathy:     Cervical: No cervical adenopathy.  Skin:    General: Skin is warm and dry.  Neurological:     Mental Status: She is alert and oriented to person, place, and time.  Psychiatric:        Mood and Affect: Mood normal.        Behavior: Behavior normal.    Ceruminosis is noted.  After obtaining verbal consent from patient wax is removed by syringing and manual debridement.  Post procedure exam shows intact tympanic membrane.  Tolerated procedure well.  Instructions for home care to prevent wax buildup are given.  Results for orders placed or performed in visit on 02/25/23 (from the past 24 hour(s))  POCT Urinalysis Dipstick     Status: Abnormal   Collection Time: 02/25/23  2:03 PM  Result Value Ref Range   Color, UA yellow    Clarity, UA clear    Glucose, UA Negative Negative   Bilirubin, UA negative    Ketones, UA negative    Spec Grav, UA >=1.030 (A) 1.010 - 1.025   Blood, UA negative    pH, UA 5.5 5.0 - 8.0  Protein, UA Negative Negative   Urobilinogen, UA 0.2 0.2 or 1.0 E.U./dL   Nitrite, UA negative    Leukocytes, UA Trace (A) Negative   Appearance     Odor      ASSESSMENT & PLAN: A total of 34 minutes was spent with the patient and counseling/coordination of care regarding preparing for this visit, review of most recent office visit notes, review of multiple chronic medical conditions under management, review of all medications, review of today's urinalysis and possibility of urinary tract infection and need for antibiotics, prognosis, documentation, need for follow-up.   Problem List Items Addressed This Visit       Nervous and Auditory   Cerumen impaction   Relevant  Orders   Ear Lavage   Hearing loss secondary to cerumen impaction, right - Primary    Successful irrigation without complications Hearing much better after procedure Earwax prevention instructions given        Other   Urinary frequency    Of 2 weeks duration.  Clinically stable.  No signs of pyelonephritis.  Urinalysis suspicious for infection.  Not enough amount for culture. Recommend to start Ceftin 250 mg twice a day for 7 days May need GYN urology evaluation      Relevant Medications   cefUROXime (CEFTIN) 250 MG tablet   Other Relevant Orders   POCT Urinalysis Dipstick (Completed)   Patient Instructions  Urinary Frequency, Adult Urinary frequency means urinating more often than usual. You may urinate every 1-2 hours even though you drink a normal amount of fluid and do not have a bladder infection or condition. Although you urinate more often than normal, the total amount of urine produced in a day is normal. With urinary frequency, you may have an urgent need to urinate often. The stress and anxiety of needing to find a bathroom quickly can make this urge worse. This condition may go away on its own, or you may need treatment at home. Home treatment may include bladder training, exercises, taking medicines, or making changes to your diet. Follow these instructions at home: Bladder health Your health care provider will tell you what to do to improve bladder health. You may be told to: Keep a bladder diary. Keep track of: What you eat and drink. How often you urinate. How much you urinate. Follow a bladder training program. This may include: Learning to delay going to the bathroom. Double urinating, also called voiding. This helps if you are not completely emptying your bladder. Scheduled voiding. Do Kegel exercises. Kegel exercises strengthen the muscles that help control urination, which may help the condition.  Eating and drinking Follow instructions from your health care  provider about eating or drinking restrictions. You may be told to: Avoid caffeine. Drink fewer fluids, especially alcohol. Avoid drinking in the evening. Avoid foods or drinks that may irritate the bladder. These include coffee, tea, soda, artificial sweeteners, citrus, tomato-based foods, and chocolate. Eat foods that help prevent or treat constipation. Constipation can make urinary frequency worse. You may need to take these actions to prevent or treat constipation: Drink enough fluid to keep your urine pale yellow. Take over-the-counter or prescription medicines. Eat foods that are high in fiber, such as beans, whole grains, and fresh fruits and vegetables. Limit foods that are high in fat and processed sugars, such as fried or sweet foods. General instructions Take over-the-counter and prescription medicines only as told by your health care provider. Keep all follow-up visits. This is important. Contact a health  care provider if: You start urinating more often. You feel pain or irritation when you urinate. You notice blood in your urine. Your urine looks cloudy. You develop a fever. You begin vomiting. Get help right away if: You are unable to urinate. Summary Urinary frequency means urinating more often than usual. With urinary frequency, you may urinate every 1-2 hours even though you drink a normal amount of fluid and do not have a bladder infection or other bladder condition. Your health care provider may recommend that you keep a bladder diary, follow a bladder training program, or make dietary changes. If told by your health care provider, do Kegel exercises to strengthen the muscles that help control urination. Take over-the-counter and prescription medicines only as told by your health care provider. Contact a health care provider if your symptoms do not improve or get worse. This information is not intended to replace advice given to you by your health care provider. Make  sure you discuss any questions you have with your health care provider. Document Revised: 02/03/2020 Document Reviewed: 02/03/2020 Elsevier Patient Education  2024 Elsevier Inc.    Edwina Barth, MD [facility]

## 2023-02-25 NOTE — Progress Notes (Signed)
PRE-PROCEDURE EXAM: right TM cannot be visualized due to total occlusion/impaction of the ear canal.  PROCEDURE INDICATION: remove wax to visualize ear drum & relieve discomfort  CONSENT:  Verbal     PROCEDURE NOTE:     right EAR:  I used warm water irrigation under direct visualization with the otoscope to free the wax bolus from the ear canal.     POST- PROCEDURE EXAM:  right TM successfully visualized and found to have no erythema     The patient tolerated the procedure well.

## 2023-02-25 NOTE — Assessment & Plan Note (Signed)
Successful irrigation without complications Hearing much better after procedure Earwax prevention instructions given

## 2023-02-25 NOTE — Patient Instructions (Signed)
Urinary Frequency, Adult Urinary frequency means urinating more often than usual. You may urinate every 1-2 hours even though you drink a normal amount of fluid and do not have a bladder infection or condition. Although you urinate more often than normal, the total amount of urine produced in a day is normal. With urinary frequency, you may have an urgent need to urinate often. The stress and anxiety of needing to find a bathroom quickly can make this urge worse. This condition may go away on its own, or you may need treatment at home. Home treatment may include bladder training, exercises, taking medicines, or making changes to your diet. Follow these instructions at home: Bladder health Your health care provider will tell you what to do to improve bladder health. You may be told to: Keep a bladder diary. Keep track of: What you eat and drink. How often you urinate. How much you urinate. Follow a bladder training program. This may include: Learning to delay going to the bathroom. Double urinating, also called voiding. This helps if you are not completely emptying your bladder. Scheduled voiding. Do Kegel exercises. Kegel exercises strengthen the muscles that help control urination, which may help the condition.  Eating and drinking Follow instructions from your health care provider about eating or drinking restrictions. You may be told to: Avoid caffeine. Drink fewer fluids, especially alcohol. Avoid drinking in the evening. Avoid foods or drinks that may irritate the bladder. These include coffee, tea, soda, artificial sweeteners, citrus, tomato-based foods, and chocolate. Eat foods that help prevent or treat constipation. Constipation can make urinary frequency worse. You may need to take these actions to prevent or treat constipation: Drink enough fluid to keep your urine pale yellow. Take over-the-counter or prescription medicines. Eat foods that are high in fiber, such as beans, whole  grains, and fresh fruits and vegetables. Limit foods that are high in fat and processed sugars, such as fried or sweet foods. General instructions Take over-the-counter and prescription medicines only as told by your health care provider. Keep all follow-up visits. This is important. Contact a health care provider if: You start urinating more often. You feel pain or irritation when you urinate. You notice blood in your urine. Your urine looks cloudy. You develop a fever. You begin vomiting. Get help right away if: You are unable to urinate. Summary Urinary frequency means urinating more often than usual. With urinary frequency, you may urinate every 1-2 hours even though you drink a normal amount of fluid and do not have a bladder infection or other bladder condition. Your health care provider may recommend that you keep a bladder diary, follow a bladder training program, or make dietary changes. If told by your health care provider, do Kegel exercises to strengthen the muscles that help control urination. Take over-the-counter and prescription medicines only as told by your health care provider. Contact a health care provider if your symptoms do not improve or get worse. This information is not intended to replace advice given to you by your health care provider. Make sure you discuss any questions you have with your health care provider. Document Revised: 02/03/2020 Document Reviewed: 02/03/2020 Elsevier Patient Education  2024 Elsevier Inc.  

## 2023-02-25 NOTE — Assessment & Plan Note (Signed)
Of 2 weeks duration.  Clinically stable.  No signs of pyelonephritis.  Urinalysis suspicious for infection.  Not enough amount for culture. Recommend to start Ceftin 250 mg twice a day for 7 days May need GYN urology evaluation

## 2023-03-02 ENCOUNTER — Telehealth: Payer: Self-pay | Admitting: Internal Medicine

## 2023-03-02 NOTE — Telephone Encounter (Signed)
Patient was seen 02/25/23 by Dr. Alvy Bimler and was prescribed cefUROXime (CEFTIN) 250 MG tablet. Patient said she had trouble urinating when taking the medication and stopped taking it. She was able to urinate again when off it. Patient would like to know what provider would advise/recommend. Best callback is [phone].

## 2023-03-03 NOTE — Telephone Encounter (Signed)
Patient's daughter called back to check on the status of the request. She would like a call back to either her or her mother. Best callback for Marylene Land is [phone]. Best callback for patient is [phone].

## 2023-03-04 NOTE — Telephone Encounter (Signed)
Patient's UTI symptoms are frequency and cloudy urine. She had no pain, burning, or discharge.

## 2023-03-04 NOTE — Telephone Encounter (Signed)
Patient was seen 02/25/23 by Dr. Alvy Bimler and was prescribed cefUROXime (CEFTIN) 250 MG tablet. Patient said she had trouble urinating when taking the medication and stopped taking it. She was able to urinate again when off it. Patient would like to know what provider would advise/recommend. Best callback is [phone].  Patient's daughter called back to check on the status of the request. She would like a call back to either her or her mother. Best callback for Marylene Land is [phone]. Best callback for patient is [phone].

## 2023-03-06 MED ORDER — NITROFURANTOIN MONOHYD MACRO 100 MG PO CAPS: 100.0000 mg | ORAL_CAPSULE | Freq: Two times a day (BID) | ORAL | 0 refills | Status: DC

## 2023-03-06 NOTE — Telephone Encounter (Signed)
I emailed the prescription for Macrobid.  Thanks

## 2023-04-15 ENCOUNTER — Encounter: Payer: Self-pay | Admitting: Internal Medicine

## 2023-04-15 ENCOUNTER — Ambulatory Visit (INDEPENDENT_AMBULATORY_CARE_PROVIDER_SITE_OTHER): Payer: Medicare Other | Admitting: Internal Medicine

## 2023-04-15 VITALS — BP 118/72 | HR 71 | Temp 98.5°F | Ht 62.0 in | Wt 169.0 lb

## 2023-04-15 DIAGNOSIS — I2583 Coronary atherosclerosis due to lipid rich plaque: Secondary | ICD-10-CM | POA: Diagnosis not present

## 2023-04-15 DIAGNOSIS — E669 Obesity, unspecified: Secondary | ICD-10-CM

## 2023-04-15 DIAGNOSIS — R3915 Urgency of urination: Secondary | ICD-10-CM | POA: Diagnosis not present

## 2023-04-15 DIAGNOSIS — M7061 Trochanteric bursitis, right hip: Secondary | ICD-10-CM

## 2023-04-15 DIAGNOSIS — C252 Malignant neoplasm of tail of pancreas: Secondary | ICD-10-CM

## 2023-04-15 DIAGNOSIS — E1169 Type 2 diabetes mellitus with other specified complication: Secondary | ICD-10-CM

## 2023-04-15 DIAGNOSIS — F411 Generalized anxiety disorder: Secondary | ICD-10-CM

## 2023-04-15 MED ORDER — HYDROCODONE-ACETAMINOPHEN 7.5-325 MG PO TABS: 1.0000 | ORAL_TABLET | Freq: Three times a day (TID) | ORAL | 0 refills | Status: DC | PRN

## 2023-04-15 NOTE — Assessment & Plan Note (Addendum)
F/u w/Dr Truett Perna - [facility] w[facility]

## 2023-04-15 NOTE — Assessment & Plan Note (Signed)
Chronic Cont on PRN Xanax  Potential benefits of a long term benzodiazepines  use as well as potential risks  and complications were explained to the patient and were aknowledged.

## 2023-04-15 NOTE — Assessment & Plan Note (Signed)
Cont on Crestor No CP

## 2023-04-15 NOTE — Progress Notes (Signed)
Subjective:  Patient ID: Amanda Arnold, female    DOB: 04-Jun-1946  Age: 77 y.o. MRN: [id number]  CC: Urinary Frequency (Rt flank pain)   HPI Amanda Arnold presents for poss ?UTI, LBP, DM C/o bladder problems all the time...  Outpatient Medications Prior to Visit  Medication Sig Dispense Refill   ALPRAZolam (XANAX) 0.5 MG tablet Take 1 tablet (0.5 mg total) by mouth 2 (two) times daily as needed for anxiety. TAKE 1 TABLET(0.5 MG) BY MOUTH TWICE DAILY AS NEEDED FOR ANXIETY 60 tablet 2   Cholecalciferol (VITAMIN D3) 50 MCG (2000 UT) capsule Take 1 capsule (2,000 Units total) by mouth daily. 100 capsule 3   Cyanocobalamin (VITAMIN B-12) 1000 MCG SUBL Place 1 tablet (1,000 mcg total) under the tongue daily. 100 tablet 3   ibuprofen (ADVIL) 200 MG tablet Take 200 mg by mouth every 6 (six) hours as needed for mild pain or moderate pain.     nitrofurantoin, macrocrystal-monohydrate, (MACROBID) 100 MG capsule Take 1 capsule (100 mg total) by mouth 2 (two) times daily. 14 capsule 0   rosuvastatin (CRESTOR) 10 MG tablet TAKE 1 TABLET(10 MG) BY MOUTH DAILY 90 tablet 3   HYDROcodone-acetaminophen (NORCO) 7.5-325 MG tablet Take 1 tablet by mouth every 8 (eight) hours as needed for severe pain. 60 tablet 0   No facility-administered medications prior to visit.    ROS: Review of Systems  Constitutional:  Positive for fatigue. Negative for activity change, appetite change, chills and unexpected weight change.  HENT:  Negative for congestion, mouth sores and sinus pressure.   Eyes:  Negative for visual disturbance.  Respiratory:  Negative for cough and chest tightness.   Gastrointestinal:  Negative for abdominal pain and nausea.  Genitourinary:  Positive for pelvic pain and urgency. Negative for difficulty urinating, frequency, hematuria and vaginal pain.  Musculoskeletal:  Positive for arthralgias and back pain. Negative for gait problem.  Skin:  Negative for pallor and rash.  Neurological:   Negative for dizziness, tremors, weakness, numbness and headaches.  Psychiatric/Behavioral:  Negative for confusion and sleep disturbance. The patient is nervous/anxious.     Objective:  BP 118/72 (BP Location: Left Arm, Patient Position: Sitting, Cuff Size: Normal)   Pulse 71   Temp 98.5 F (36.9 C) (Oral)   Ht 5\' 2"  (1.575 m)   Wt 169 lb (76.7 kg)   LMP  (LMP Unknown)   SpO2 95%   BMI 30.91 kg/m   BP Readings from Last 3 Encounters:  04/15/23 118/72  02/25/23 114/62  01/23/23 119/70    Wt Readings from Last 3 Encounters:  04/15/23 169 lb (76.7 kg)  02/25/23 168 lb 4 oz (76.3 kg)  01/23/23 169 lb 3.2 oz (76.7 kg)    Physical Exam Constitutional:      General: She is not in acute distress.    Appearance: She is well-developed.  HENT:     Head: Normocephalic.     Right Ear: External ear normal.     Left Ear: External ear normal.     Nose: Nose normal.  Eyes:     General:        Right eye: No discharge.        Left eye: No discharge.     Conjunctiva/sclera: Conjunctivae normal.     Pupils: Pupils are equal, round, and reactive to light.  Neck:     Thyroid: No thyromegaly.     Vascular: No JVD.     Trachea: No tracheal deviation.  Cardiovascular:     Rate and Rhythm: Normal rate and regular rhythm.     Heart sounds: Normal heart sounds.  Pulmonary:     Effort: No respiratory distress.     Breath sounds: No stridor. No wheezing.  Abdominal:     General: Bowel sounds are normal. There is no distension.     Palpations: Abdomen is soft. There is no mass.     Tenderness: There is no abdominal tenderness. There is no guarding or rebound.  Musculoskeletal:        General: No tenderness.     Cervical back: Normal range of motion and neck supple. No rigidity.  Lymphadenopathy:     Cervical: No cervical adenopathy.  Skin:    Findings: No erythema or rash.  Neurological:     Mental Status: She is oriented to person, place, and time.     Cranial Nerves: No cranial  nerve deficit.     Motor: No abnormal muscle tone.     Coordination: Coordination normal.     Gait: Gait abnormal.     Deep Tendon Reflexes: Reflexes normal.  Psychiatric:        Behavior: Behavior normal.        Thought Content: Thought content normal.        Judgment: Judgment normal.   B hips pain  Lab Results  Component Value Date   WBC 7.5 09/09/2022   HGB 14.1 09/09/2022   HCT 43.0 09/09/2022   PLT 319.0 09/09/2022   GLUCOSE 140 (H) 09/09/2022   CHOL 167 09/17/2017   TRIG 92.0 09/17/2017   HDL 53.00 09/17/2017   LDLDIRECT 56 06/25/2021   LDLCALC 96 09/17/2017   ALT 12 09/09/2022   AST 22 09/09/2022   NA 140 09/09/2022   K 4.3 09/09/2022   CL 104 09/09/2022   CREATININE 0.60 09/09/2022   BUN 15 09/09/2022   CO2 29 09/09/2022   TSH 1.43 09/09/2022   INR 1.1 (H) 08/22/2020   HGBA1C 6.8 (H) 09/09/2022    MM 3D SCREEN BREAST BILATERAL  Result Date: 03/10/2022 CLINICAL DATA:  Screening. EXAM: DIGITAL SCREENING BILATERAL MAMMOGRAM WITH TOMOSYNTHESIS AND CAD TECHNIQUE: Bilateral screening digital craniocaudal and mediolateral oblique mammograms were obtained. Bilateral screening digital breast tomosynthesis was performed. The images were evaluated with computer-aided detection. COMPARISON:  Previous exam(s). ACR Breast Density Category b: There are scattered areas of fibroglandular density. FINDINGS: There are no findings suspicious for malignancy. IMPRESSION: No mammographic evidence of malignancy. A result letter of this screening mammogram will be mailed directly to the patient. RECOMMENDATION: Screening mammogram in one year. (Code:SM-B-01Y) BI-RADS CATEGORY  1: Negative. Electronically Signed   By: Sherian Rein M.D.   On: 03/10/2022 16:53    Assessment & Plan:   Problem List Items Addressed This Visit     Generalized anxiety disorder    Chronic Cont on PRN Xanax  Potential benefits of a long term benzodiazepines  use as well as potential risks  and complications  were explained to the patient and were aknowledged.      Relevant Orders   Urinalysis   CULTURE, URINE COMPREHENSIVE   Type 2 diabetes mellitus with obesity (HCC)    Check A1c Not on meds      Relevant Orders   Urinalysis   Comprehensive metabolic panel   Hemoglobin A1c   CBC with Differential/Platelet   Trochanteric bursitis of right hip    Stretch hips      Coronary atherosclerosis    Cont  on Crestor No CP      Relevant Orders   CBC with Differential/Platelet   Cancer of pancreas, tail (HCC)    F/u w/Dr Truett Perna - cancer center Labs w/CMET      Relevant Orders   Urinalysis   CBC with Differential/Platelet   Urgency of urination - Primary    Worse UA and Cx Urology ref      Relevant Orders   Urinalysis   Comprehensive metabolic panel   CBC with Differential/Platelet   Ambulatory referral to Urology      Meds ordered this encounter  Medications   HYDROcodone-acetaminophen (NORCO) 7.5-325 MG tablet    Sig: Take 1 tablet by mouth every 8 (eight) hours as needed for severe pain.    Dispense:  60 tablet    Refill:  0    M54.30      Follow-up: Return in about 3 months (around 07/16/2023) for a follow-up visit.  Sonda Primes, MD

## 2023-04-15 NOTE — Assessment & Plan Note (Signed)
Check A1c Not on meds

## 2023-04-15 NOTE — Assessment & Plan Note (Signed)
Worse UA and Cx Urology ref

## 2023-04-15 NOTE — Assessment & Plan Note (Signed)
Stretch hips

## 2023-04-16 ENCOUNTER — Telehealth: Payer: Self-pay | Admitting: Internal Medicine

## 2023-04-16 LAB — CBC WITH DIFFERENTIAL/PLATELET
Basophils Absolute: 0.1 10*3/uL (ref 0.0–0.1)
Basophils Relative: 1 % (ref 0.0–3.0)
Eosinophils Absolute: 0.1 10*3/uL (ref 0.0–0.7)
Eosinophils Relative: 1.3 % (ref 0.0–5.0)
HCT: 42.6 % (ref 36.0–46.0)
Hemoglobin: 13.8 g/dL (ref 12.0–15.0)
Lymphocytes Relative: 43.6 % (ref 12.0–46.0)
Lymphs Abs: 4.7 10*3/uL — ABNORMAL HIGH (ref 0.7–4.0)
MCHC: 32.3 g/dL (ref 30.0–36.0)
MCV: 97.2 fL (ref 78.0–100.0)
Monocytes Absolute: 1 10*3/uL (ref 0.1–1.0)
Monocytes Relative: 8.9 % (ref 3.0–12.0)
Neutro Abs: 4.9 10*3/uL (ref 1.4–7.7)
Neutrophils Relative %: 45.2 % (ref 43.0–77.0)
Platelets: 277 10*3/uL (ref 150.0–400.0)
RBC: 4.38 Mil/uL (ref 3.87–5.11)
RDW: 13 % (ref 11.5–15.5)
WBC: 10.9 10*3/uL — ABNORMAL HIGH (ref 4.0–10.5)

## 2023-04-16 LAB — COMPREHENSIVE METABOLIC PANEL
ALT: 11 U/L (ref 0–35)
AST: 22 U/L (ref 0–37)
Albumin: 4.2 g/dL (ref 3.5–5.2)
Alkaline Phosphatase: 89 U/L (ref 39–117)
BUN: 18 mg/dL (ref 6–23)
CO2: 28 meq/L (ref 19–32)
Calcium: 9.8 mg/dL (ref 8.4–10.5)
Chloride: 103 meq/L (ref 96–112)
Creatinine, Ser: 0.65 mg/dL (ref 0.40–1.20)
GFR: 86.4 mL/min (ref >60.00)
Glucose, Bld: 96 mg/dL (ref 70–99)
Potassium: 4.7 meq/L (ref 3.5–5.1)
Sodium: 138 meq/L (ref 135–145)
Total Bilirubin: 0.5 mg/dL (ref 0.2–1.2)
Total Protein: 7.2 g/dL (ref 6.0–8.3)

## 2023-04-16 LAB — URINALYSIS, ROUTINE W REFLEX MICROSCOPIC
Bilirubin Urine: NEGATIVE
Hgb urine dipstick: NEGATIVE
Ketones, ur: NEGATIVE
Nitrite: NEGATIVE
Specific Gravity, Urine: >=1.03 — AB (ref 1.000–1.030)
Total Protein, Urine: NEGATIVE
Urine Glucose: NEGATIVE
Urobilinogen, UA: 0.2 (ref 0.0–1.0)
pH: 6 (ref 5.0–8.0)

## 2023-04-16 LAB — HEMOGLOBIN A1C: Hgb A1c MFr Bld: 6.7 % — ABNORMAL HIGH (ref 4.6–6.5)

## 2023-04-16 NOTE — Telephone Encounter (Signed)
Patient called and said that pharmacy would not fill her rx yesterday for hydrocodone - she said pharmacy said that the insurance needs more information.  Please call patient and advise what is going on.  Phone:  [phone]

## 2023-04-17 ENCOUNTER — Telehealth: Payer: Self-pay

## 2023-04-17 ENCOUNTER — Other Ambulatory Visit (HOSPITAL_COMMUNITY): Payer: Self-pay

## 2023-04-17 LAB — CULTURE, URINE COMPREHENSIVE

## 2023-04-17 NOTE — Telephone Encounter (Signed)
Pharmacy Patient Advocate Encounter   Received notification from Pt Calls Messages that prior authorization for Hydrocodone/APAP 7.5/325mg  is required/requested.   Insurance verification completed.   The patient is insured through [facility] .   Per test claim: PA required; PA submitted to [facility] via CoverMyMeds Key/confirmation #/EOC [id number] Status is pending

## 2023-04-17 NOTE — Telephone Encounter (Signed)
Pharmacy Patient Advocate Encounter  Received notification from [facility] that Prior Authorization for Hydrocodone/APAP 7.5/325mg  has been APPROVED from 04/17/23 to 05/17/23   PA #/Case ID/Reference #: [id number]

## 2023-04-17 NOTE — Telephone Encounter (Signed)
Medication was sent in on 04/15/2023.  PA team has been informed that PA is needed for pain meds.   HYDROcodone-acetaminophen (NORCO) 7.5-325 MG tablet

## 2023-04-17 NOTE — Telephone Encounter (Signed)
Patient's daughter called and said that nothing has been called in and her mom is still hurting really bad.

## 2023-04-20 NOTE — Telephone Encounter (Signed)
Patient called and said that she is still having pain in her side.    Please call patient:  [phone]

## 2023-04-23 NOTE — Telephone Encounter (Signed)
It is probably musculoskeletal pain Use heat/ice Use topical arthritis cream She has a Norco prescription. Thank you

## 2023-04-23 NOTE — Telephone Encounter (Signed)
Pt called back instruction was  given during call pt understood.

## 2023-04-23 NOTE — Telephone Encounter (Signed)
LVM for pt to call office for providers suggestions on pain control. Please inform pt that provider has stated "It is probably musculoskeletal pain Use heat/ice Use topical arthritis cream She has a Norco prescription. Thank you"

## 2023-05-08 DIAGNOSIS — L814 Other melanin hyperpigmentation: Secondary | ICD-10-CM | POA: Diagnosis not present

## 2023-05-08 DIAGNOSIS — L821 Other seborrheic keratosis: Secondary | ICD-10-CM | POA: Diagnosis not present

## 2023-05-08 DIAGNOSIS — L304 Erythema intertrigo: Secondary | ICD-10-CM | POA: Diagnosis not present

## 2023-05-08 DIAGNOSIS — Z85828 Personal history of other malignant neoplasm of skin: Secondary | ICD-10-CM | POA: Diagnosis not present

## 2023-05-08 DIAGNOSIS — L728 Other follicular cysts of the skin and subcutaneous tissue: Secondary | ICD-10-CM | POA: Diagnosis not present

## 2023-05-08 DIAGNOSIS — D225 Melanocytic nevi of trunk: Secondary | ICD-10-CM | POA: Diagnosis not present

## 2023-05-08 DIAGNOSIS — Z08 Encounter for follow-up examination after completed treatment for malignant neoplasm: Secondary | ICD-10-CM | POA: Diagnosis not present

## 2023-06-02 ENCOUNTER — Encounter: Payer: Self-pay | Admitting: Internal Medicine

## 2023-07-16 ENCOUNTER — Encounter: Payer: Self-pay | Admitting: Internal Medicine

## 2023-07-16 ENCOUNTER — Ambulatory Visit (INDEPENDENT_AMBULATORY_CARE_PROVIDER_SITE_OTHER): Payer: Medicare Other | Admitting: Internal Medicine

## 2023-07-16 VITALS — BP 110/70 | HR 78 | Temp 99.1°F | Ht 62.0 in | Wt 170.0 lb

## 2023-07-16 DIAGNOSIS — M5440 Lumbago with sciatica, unspecified side: Secondary | ICD-10-CM | POA: Diagnosis not present

## 2023-07-16 DIAGNOSIS — M25561 Pain in right knee: Secondary | ICD-10-CM | POA: Diagnosis not present

## 2023-07-16 DIAGNOSIS — E66812 Obesity, class 2: Secondary | ICD-10-CM | POA: Diagnosis not present

## 2023-07-16 DIAGNOSIS — F411 Generalized anxiety disorder: Secondary | ICD-10-CM

## 2023-07-16 DIAGNOSIS — R1031 Right lower quadrant pain: Secondary | ICD-10-CM

## 2023-07-16 DIAGNOSIS — E119 Type 2 diabetes mellitus without complications: Secondary | ICD-10-CM | POA: Diagnosis not present

## 2023-07-16 DIAGNOSIS — M25562 Pain in left knee: Secondary | ICD-10-CM

## 2023-07-16 DIAGNOSIS — Z6836 Body mass index (BMI) 36.0-36.9, adult: Secondary | ICD-10-CM

## 2023-07-16 DIAGNOSIS — G8929 Other chronic pain: Secondary | ICD-10-CM | POA: Diagnosis not present

## 2023-07-16 MED ORDER — HYDROCODONE-ACETAMINOPHEN 7.5-325 MG PO TABS: 1.0000 | ORAL_TABLET | Freq: Three times a day (TID) | ORAL | 0 refills | Status: DC | PRN

## 2023-07-16 MED ORDER — ALPRAZOLAM 0.5 MG PO TABS: 0.5000 mg | ORAL_TABLET | Freq: Two times a day (BID) | ORAL | 2 refills | Status: DC | PRN

## 2023-07-16 NOTE — Progress Notes (Signed)
 Subjective:  Patient ID: Amanda Arnold, female    DOB: Jan 05, 1946  Age: 78 y.o. MRN: [id number]  CC: Medical Management of Chronic Issues (3 mnth f/u)   HPI Amanda Arnold presents for fever C/o LBP, knee pain  Outpatient Medications Prior to Visit  Medication Sig Dispense Refill   Cholecalciferol (VITAMIN D3) 50 MCG (2000 UT) capsule Take 1 capsule (2,000 Units total) by mouth daily. 100 capsule 3   Cyanocobalamin  (VITAMIN B-12) 1000 MCG SUBL Place 1 tablet (1,000 mcg total) under the tongue daily. 100 tablet 3   ibuprofen  (ADVIL ) 200 MG tablet Take 200 mg by mouth every 6 (six) hours as needed for mild pain or moderate pain.     rosuvastatin  (CRESTOR ) 10 MG tablet TAKE 1 TABLET(10 MG) BY MOUTH DAILY 90 tablet 3   ALPRAZolam  (XANAX ) 0.5 MG tablet Take 1 tablet (0.5 mg total) by mouth 2 (two) times daily as needed for anxiety. TAKE 1 TABLET(0.5 MG) BY MOUTH TWICE DAILY AS NEEDED FOR ANXIETY 60 tablet 2   HYDROcodone -acetaminophen  (NORCO) 7.5-325 MG tablet Take 1 tablet by mouth every 8 (eight) hours as needed for severe pain. 60 tablet 0   nitrofurantoin , macrocrystal-monohydrate, (MACROBID ) 100 MG capsule Take 1 capsule (100 mg total) by mouth 2 (two) times daily. 14 capsule 0   No facility-administered medications prior to visit.    ROS: Review of Systems  Objective:  BP 110/70 (BP Location: Right Arm, Patient Position: Sitting, Cuff Size: Normal)   Pulse 78   Temp 99.1 F (37.3 C) (Oral)   Ht 5' 2 (1.575 m)   Wt 170 lb (77.1 kg)   LMP  (LMP Unknown)   SpO2 94%   BMI 31.09 kg/m   BP Readings from Last 3 Encounters:  07/21/23 135/65  07/16/23 110/70  04/15/23 118/72    Wt Readings from Last 3 Encounters:  07/21/23 169 lb 3.2 oz (76.7 kg)  07/16/23 170 lb (77.1 kg)  04/15/23 169 lb (76.7 kg)    Physical Exam  Lab Results  Component Value Date   WBC 10.9 (H) 04/15/2023   HGB 13.8 04/15/2023   HCT 42.6 04/15/2023   PLT 277.0 04/15/2023   GLUCOSE 96  04/15/2023   CHOL 167 09/17/2017   TRIG 92.0 09/17/2017   HDL 53.00 09/17/2017   LDLDIRECT 56 06/25/2021   LDLCALC 96 09/17/2017   ALT 11 04/15/2023   AST 22 04/15/2023   NA 138 04/15/2023   K 4.7 04/15/2023   CL 103 04/15/2023   CREATININE 0.65 04/15/2023   BUN 18 04/15/2023   CO2 28 04/15/2023   TSH 1.43 09/09/2022   INR 1.1 (H) 08/22/2020   HGBA1C 6.7 (H) 04/15/2023    MM 3D SCREEN BREAST BILATERAL Result Date: 03/10/2022 CLINICAL DATA:  Screening. EXAM: DIGITAL SCREENING BILATERAL MAMMOGRAM WITH TOMOSYNTHESIS AND CAD TECHNIQUE: Bilateral screening digital craniocaudal and mediolateral oblique mammograms were obtained. Bilateral screening digital breast tomosynthesis was performed. The images were evaluated with computer-aided detection. COMPARISON:  Previous exam(s). ACR Breast Density Category b: There are scattered areas of fibroglandular density. FINDINGS: There are no findings suspicious for malignancy. IMPRESSION: No mammographic evidence of malignancy. A result letter of this screening mammogram will be mailed directly to the patient. RECOMMENDATION: Screening mammogram in one year. (Code:SM-B-01Y) BI-RADS CATEGORY  1: Negative. Electronically Signed   By: Craig Farr M.D.   On: 03/10/2022 16:53    Assessment & Plan:   Problem List Items Addressed This Visit  Obesity - Primary   Wt Readings from Last 3 Encounters:  07/21/23 169 lb 3.2 oz (76.7 kg)  07/16/23 170 lb (77.1 kg)  04/15/23 169 lb (76.7 kg)         Generalized anxiety disorder   Chronic Cont on PRN Xanax   Potential benefits of a long term benzodiazepines  use as well as potential risks  and complications were explained to the patient and were aknowledged.      Relevant Medications   ALPRAZolam  (XANAX ) 0.5 MG tablet   KNEE PAIN   Use a brace as needed Pain meds-see separate note Weight loss should help      LOW BACK PAIN   Chronic MSK/OA.-- spinal stenosis  Norco prn  Potential benefits of  a long term opioids use as well as potential risks (i.e. addiction risk, apnea etc) and complications (i.e. Somnolence, constipation and others) were explained to the patient and were aknowledged.  Potential benefits of a long term NSAIDs  use as well as potential risks  and complications were explained to the patient and were aknowledged.  Handicapped decal       Relevant Medications   HYDROcodone -acetaminophen  (NORCO) 7.5-325 MG tablet   Right lower quadrant pain   Chronic pain.  No change      Other Visit Diagnoses       Controlled type 2 diabetes mellitus without complication, without long-term current use of insulin  (HCC)             Meds ordered this encounter  Medications   ALPRAZolam  (XANAX ) 0.5 MG tablet    Sig: Take 1 tablet (0.5 mg total) by mouth 2 (two) times daily as needed for anxiety.    Dispense:  60 tablet    Refill:  2   HYDROcodone -acetaminophen  (NORCO) 7.5-325 MG tablet    Sig: Take 1 tablet by mouth every 8 (eight) hours as needed for severe pain (pain score 7-10).    Dispense:  60 tablet    Refill:  0    M54.30      Follow-up: Return in about 3 months (around 10/14/2023) for a follow-up visit.  Marolyn Noel, MD

## 2023-07-21 ENCOUNTER — Telehealth: Payer: Self-pay | Admitting: *Deleted

## 2023-07-21 ENCOUNTER — Inpatient Hospital Stay: Payer: Medicare Other | Attending: Oncology | Admitting: Oncology

## 2023-07-21 VITALS — BP 135/65 | HR 60 | Temp 98.1°F | Resp 18 | Ht 62.0 in | Wt 169.2 lb

## 2023-07-21 DIAGNOSIS — C252 Malignant neoplasm of tail of pancreas: Secondary | ICD-10-CM

## 2023-07-21 DIAGNOSIS — Z8507 Personal history of malignant neoplasm of pancreas: Secondary | ICD-10-CM | POA: Insufficient documentation

## 2023-07-21 NOTE — Progress Notes (Signed)
 [facility] OFFICE PROGRESS NOTE   Diagnosis: Pancreas cancer  INTERVAL HISTORY:   Amanda Arnold returns as scheduled.  She generally feels well.  Good appetite.  She has chronic pain at the low lateral right abdomen, right iliac, and right leg.  No new pain.  Objective:  Vital signs in last 24 hours:  Blood pressure 135/65, pulse 60, temperature 98.1 F (36.7 C), temperature source Temporal, resp. rate 18, height 5' 2 (1.575 m), weight 169 lb 3.2 oz (76.7 kg), SpO2 98%.    Lymphatics: No cervical, supraclavicular, axillary, or inguinal nodes Resp: Lungs clear bilaterally regular rate and rhythm Cardio: Regular rate and rhythm GI: The abdomen is soft and nontender, no mass, no apparent ascites, no hepatosplenomegaly Vascular: No leg edema   Lab Results:  Lab Results  Component Value Date   WBC 10.9 (H) 04/15/2023   HGB 13.8 04/15/2023   HCT 42.6 04/15/2023   MCV 97.2 04/15/2023   PLT 277.0 04/15/2023   NEUTROABS 4.9 04/15/2023    CMP  Lab Results  Component Value Date   NA 138 04/15/2023   K 4.7 04/15/2023   CL 103 04/15/2023   CO2 28 04/15/2023   GLUCOSE 96 04/15/2023   BUN 18 04/15/2023   CREATININE 0.65 04/15/2023   CALCIUM  9.8 04/15/2023   PROT 7.2 04/15/2023   ALBUMIN  4.2 04/15/2023   AST 22 04/15/2023   ALT 11 04/15/2023   ALKPHOS 89 04/15/2023   BILITOT 0.5 04/15/2023   GFRNONAA >60 05/22/2021   GFRAA 103 04/20/2020    Lab Results  Component Value Date   CAN199 10 01/23/2023    Lab Results  Component Value Date   INR 1.1 (H) 08/22/2020   LABPROT 12.6 08/22/2020    Imaging:  No results found.  Medications: I have reviewed the patient's current medications.   Assessment/Plan: Pancreas cancer-stage IIb (pT2pN1) CT abdomen/pelvis 06/01/2020-2.9 x 3 cm pancreas tail mass, slight increase in size of 7 mm right middle lobe nodule MRI/MRCP 06/22/2020-solid 3.8 x 3 x 3.1 cm pancreas tail mass with focal occlusion of the  splenic vein and dilated venous collaterals, no evidence of metastatic disease, bilateral adrenal adenomas CT renal stone study 08/02/2020-scattered airspace opacities throughout the lung bases-new favored to represent infectious/inflammatory changes EUS 08/16/2020-pancreas tail mass, T2N0, FNA biopsy-suspicious for malignancy CTs 08/27/2020-unchanged right middle lobe nodule, superficial left upper back lesion, no change in pancreas tail mass, occluded splenic vein with collaterals at the splenic hilum, no enlarged abdominal pelvic lymph nodes Distal pancreatectomy/splenectomy 10/17/2020- 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  2/15 lymph nodes positive, positive lymphovascular and perineural invasion, resection margins negative, pT2pN1 Cycle 1 gemcitabine /capecitabine  11/21/2020, day 15 gemcitabine  12/05/2020 Cycle 2 gemcitabine /capecitabine  12/20/2020, day 15 gemcitabine  01/03/2021 Cycle 3 gemcitabine /capecitabine  01/16/2021, day 15 gemcitabine  01/30/2021 Cycle 4 gemcitabine /capecitabine  02/13/2021, day 15 gemcitabine  02/27/2021 Cycle 5 gemcitabine /capecitabine  03/13/2021 (capecitabine  dose reduced secondary to hand/foot syndrome), day 15 gemcitabine  03/27/2021 Cycle 6 gemcitabine /capecitabine  04/10/2021, day 15 gemcitabine  04/24/2021 CT abdomen/pelvis 07/26/2021-slight increase in size of a cystic lesion at the distal pancreatic tail/stump.  Additional fluid collections at the surgical bed decreased in size. MRI abdomen 02/12/2022-unchanged nonenhancing fluid signal cyst at the distal pancreatic resection margin without enhancement consistent with a postoperative seroma or pseudocyst, no evidence of metastatic disease   2.   Gastric ulcer on EUS 08/16/2020-H. pylori positive 3.   COVID-19 infection January 2022 4.   COPD 5.   Hypertension 6.  Right abdomen/right leg pain-musculoskeletal? 7.   Right middle lobe nodule on CT  06/01/2020, increased from 2017, stable on CT 11/13/2020, stable on CT 04/23/2021 8.   History of tobacco use 9.   Superficial left upper back lesion noted on CT 08/27/2020    Disposition: Ms. Nodine is in clinical remission from pancreas cancer.  She will return for an office visit in 6 months.  I suspect the right iliac and leg pain are related to a benign musculoskeletal condition in the back.  She reports the pain has been intermittently present for years.  I will refer her for a chest CT to follow-up on the right lung nodule noted on previous CTs and as screening for lung cancer.  She would not qualify for the lung cancer screening clinic until she is 5 years out from the pancreas cancer diagnosis.    Arley Hof, MD  07/21/2023  10:03 AM

## 2023-07-21 NOTE — Telephone Encounter (Signed)
 Notified patient of CT appointment and prep on 08/04/22 at [facility] at 4:30/5:00. Notified managed care to complete PA

## 2023-07-23 DIAGNOSIS — L448 Other specified papulosquamous disorders: Secondary | ICD-10-CM | POA: Diagnosis not present

## 2023-07-27 ENCOUNTER — Ambulatory Visit: Payer: Medicare Other | Admitting: Oncology

## 2023-07-27 DIAGNOSIS — R3914 Feeling of incomplete bladder emptying: Secondary | ICD-10-CM | POA: Diagnosis not present

## 2023-07-27 DIAGNOSIS — R350 Frequency of micturition: Secondary | ICD-10-CM | POA: Diagnosis not present

## 2023-07-29 ENCOUNTER — Encounter: Payer: Self-pay | Admitting: Internal Medicine

## 2023-07-29 NOTE — Assessment & Plan Note (Signed)
Chronic Cont on PRN Xanax  Potential benefits of a long term benzodiazepines  use as well as potential risks  and complications were explained to the patient and were aknowledged.

## 2023-07-29 NOTE — Assessment & Plan Note (Signed)
 Chronic MSK/OA.-- spinal stenosis  Norco prn  Potential benefits of a long term opioids use as well as potential risks (i.e. addiction risk, apnea etc) and complications (i.e. Somnolence, constipation and others) were explained to the patient and were aknowledged.  Potential benefits of a long term NSAIDs  use as well as potential risks  and complications were explained to the patient and were aknowledged.  Handicapped decal

## 2023-07-29 NOTE — Assessment & Plan Note (Signed)
 Chronic pain.  No change

## 2023-07-29 NOTE — Assessment & Plan Note (Signed)
 Wt Readings from Last 3 Encounters:  07/21/23 169 lb 3.2 oz (76.7 kg)  07/16/23 170 lb (77.1 kg)  04/15/23 169 lb (76.7 kg)

## 2023-07-29 NOTE — Assessment & Plan Note (Signed)
 Use a brace as needed Pain meds-see separate note Weight loss should help

## 2023-08-05 ENCOUNTER — Ambulatory Visit (HOSPITAL_BASED_OUTPATIENT_CLINIC_OR_DEPARTMENT_OTHER)
Admission: RE | Admit: 2023-08-05 | Discharge: 2023-08-05 | Disposition: A | Discharge status: Home / Self Care | Payer: Medicare Other | Source: Ambulatory Visit | Attending: Oncology | Admitting: Oncology

## 2023-08-05 DIAGNOSIS — C252 Malignant neoplasm of tail of pancreas: Secondary | ICD-10-CM | POA: Insufficient documentation

## 2023-08-05 DIAGNOSIS — R911 Solitary pulmonary nodule: Secondary | ICD-10-CM | POA: Diagnosis not present

## 2023-08-05 DIAGNOSIS — D3501 Benign neoplasm of right adrenal gland: Secondary | ICD-10-CM | POA: Diagnosis not present

## 2023-08-05 DIAGNOSIS — D3502 Benign neoplasm of left adrenal gland: Secondary | ICD-10-CM | POA: Diagnosis not present

## 2023-08-17 ENCOUNTER — Telehealth: Payer: Self-pay

## 2023-08-17 NOTE — Telephone Encounter (Signed)
-----   Message from Thornton Papas sent at 08/14/2023  4:20 PM EST ----- Please call patient, CT shows a tiny stable right lung nodule and no evidence of cancer, follow-up as scheduled

## 2023-08-17 NOTE — Telephone Encounter (Signed)
Patient understood, had no further questions.

## 2023-08-21 ENCOUNTER — Encounter: Payer: Self-pay | Admitting: Internal Medicine

## 2023-08-21 ENCOUNTER — Ambulatory Visit (AMBULATORY_SURGERY_CENTER): Payer: Medicare Other | Admitting: *Deleted

## 2023-08-21 VITALS — Ht 62.0 in | Wt 169.0 lb

## 2023-08-21 DIAGNOSIS — Z860100 Personal history of colon polyps, unspecified: Secondary | ICD-10-CM

## 2023-08-21 MED ORDER — SUFLAVE 178.7 G PO SOLR
1.0000 | Freq: Once | ORAL | 0 refills | Status: AC
Start: 2023-08-21 — End: 2023-08-21

## 2023-08-21 NOTE — Progress Notes (Addendum)
 Pt's name and DOB verified at the beginning of the pre-visit wit 2 identifiers and permission to speak to [facility] her daughter  Pt denies any difficulty with ambulating,sitting, laying down or rolling side to side  Pt has no issues with ambulation   Pt has no issues moving head neck or swallowing  No egg or soy allergy known to patient   No issues known to pt with past sedation with any surgeries or procedures  Pt denies having issues being intubated  No FH of Malignant Hyperthermia  Pt is not on diet pills or shots  Pt is not on home 02   Pt is not on blood thinners   Pt denies issues with constipation   Pt is not on dialysis  Pt denise any abnormal heart rhythms   Pt denies any upcoming cardiac testing  Pt encouraged to use to use Singlecare or Goodrx to reduce cost   Patient's chart reviewed by Norleen Schillings CNRA prior to pre-visit and patient appropriate for the [facility].  Pre-visit completed and red dot placed by patient's name on their procedure day (on provider's schedule).  .  Visit by phone  Pt states weight is 169 lb  Instructed to not do snuff on day of procedure. Pt states she won't  Instructed pt why it is important to and  to call if they have any changes in health or new medications. Directed them to the # given and on instructions.     Instructions reviewed. Pt given both  [facility] and as well as[facility] main # and MD on call # prior to instructions.  Pt states understanding. Instructed to review again prior to procedure. Pt states they will.   Informed pt that they will receive a call from [facility] regarding there prep med. Pt does not have text ablitly

## 2023-09-04 ENCOUNTER — Ambulatory Visit: Payer: Medicare Other | Admitting: Internal Medicine

## 2023-09-04 ENCOUNTER — Encounter: Payer: Self-pay | Admitting: Internal Medicine

## 2023-09-04 VITALS — BP 119/63 | HR 74 | Temp 98.1°F | Resp 22 | Ht 62.0 in | Wt 169.0 lb

## 2023-09-04 DIAGNOSIS — Z860100 Personal history of colon polyps, unspecified: Secondary | ICD-10-CM

## 2023-09-04 DIAGNOSIS — D122 Benign neoplasm of ascending colon: Secondary | ICD-10-CM

## 2023-09-04 DIAGNOSIS — K5730 Diverticulosis of large intestine without perforation or abscess without bleeding: Secondary | ICD-10-CM | POA: Diagnosis not present

## 2023-09-04 DIAGNOSIS — Z1211 Encounter for screening for malignant neoplasm of colon: Secondary | ICD-10-CM | POA: Diagnosis not present

## 2023-09-04 DIAGNOSIS — I10 Essential (primary) hypertension: Secondary | ICD-10-CM | POA: Diagnosis not present

## 2023-09-04 DIAGNOSIS — Z860101 Personal history of adenomatous and serrated colon polyps: Secondary | ICD-10-CM

## 2023-09-04 MED ORDER — SODIUM CHLORIDE 0.9 % IV SOLN: 500.0000 mL | Freq: Once | INTRAVENOUS | Status: DC

## 2023-09-04 NOTE — Progress Notes (Signed)
 Report to PACU, RN, vss, BBS= Clear.

## 2023-09-04 NOTE — Progress Notes (Signed)
HISTORY OF PRESENT ILLNESS:  Amanda Arnold is a 77 y.o. female with a history of adenomatous colon polyps.  Last colonoscopy 2016.  Now for surveillance  REVIEW OF SYSTEMS:  All non-GI ROS negative except for  Past Medical History:  Diagnosis Date   Abdominal pain, epigastric 12/18/2009   ANXIETY 05/25/2006   BRONCHITIS, ACUTE 12/08/2007   Cataract    Complication of anesthesia     difficulty waking up x 1 - "years ago"   Constipation    COPD 05/25/2006   COVID-19 07/10/2022   DEPRESSION 01/02/2006   ESOTROPIA, LEFT EYE 06/03/1948   Family history of breast cancer 11/26/2020   FIBROMYALGIA 04/02/2006   GERD (gastroesophageal reflux disease)    Headache(784.0) 12/08/2007   History of blood transfusion    HYPERLIPIDEMIA 04/30/2006   HYPERTENSION 05/25/2006   Hypertension    INSOMNIA, PERSISTENT 02/22/2008   Lazy eye    left - no surgery   LOW BACK PAIN 01/27/2007   Migraines    NEURALGIA, 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/2009   Neuropathy    OBSTRUCTIVE SLEEP APNEA 04/17/2010   does not use CPAP   OSTEOARTHRITIS 05/25/2006   pancreatic ca dx'd 06/2020   Polyuria    Pre-diabetes    diet controlled   Psychosomatic disease 2011   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   T M J 05/11/2009    Past Surgical History:  Procedure Laterality Date   BIOPSY  08/16/2020   Procedure: BIOPSY;  Surgeon: Lemar Lofty., MD;  Location: [facility] ENDOSCOPY;  Service: Endoscopy;;   cataract surgery Bilateral    CHOLECYSTECTOMY     COLONOSCOPY     ESOPHAGOGASTRODUODENOSCOPY (EGD) WITH PROPOFOL N/A 08/16/2020   Procedure: ESOPHAGOGASTRODUODENOSCOPY (EGD) WITH PROPOFOL;  Surgeon: Lemar Lofty., MD;  Location: [facility] ENDOSCOPY;  Service: Endoscopy;  Laterality: N/A;   EUS N/A 08/16/2020   Procedure: UPPER ENDOSCOPIC ULTRASOUND (EUS) RADIAL;  Surgeon: Meridee Score Netty Starring., MD;  Location: [facility] ENDOSCOPY;  Service: Endoscopy;  Laterality: N/A;   EYE SURGERY Bilateral    cataract with lens implant    FINE NEEDLE ASPIRATION  08/16/2020   Procedure: FINE NEEDLE ASPIRATION (FNA) LINEAR;  Surgeon: Lemar Lofty., MD;  Location: [facility] ENDOSCOPY;  Service: Endoscopy;;   MASS EXCISION Left 10/06/2016   Procedure: EXCISION LEFT BUTTOCKS MASS;  Surgeon: Ovidio Kin, MD;  Location: [facility];  Service: General;  Laterality: Left;   PORT-A-CATH REMOVAL N/A 06/13/2021   Procedure: PORT REMOVAL;  Surgeon: Almond Lint, MD;  Location: [facility] OR;  Service: General;  Laterality: N/A;   PORTACATH PLACEMENT N/A 11/14/2020   Procedure: INSERTION PORT-A-CATH;  Surgeon: Almond Lint, MD;  Location: [facility] OR;  Service: General;  Laterality: N/A;   XI ROBOTIC ASSISTED LAPAROSCOPIC DISTAL PANCREATECTOMY N/A 10/17/2020   Procedure: XI ROBOTIC ASSISTED DISTAL PANCREATECTOMY AND SPLENECTOMY;  Surgeon: Almond Lint, MD;  Location: [facility] OR;  Service: General;  Laterality: N/A;    Social History Amanda Arnold  reports that she quit smoking about 10 years ago. Her smoking use included cigarettes. Her smokeless tobacco use includes snuff. She reports that she does not drink alcohol and does not use drugs.  family history includes Breast cancer in her maternal aunt; Cancer in her maternal uncle; Cervical cancer in her mother; Heart attack in her father; Heart disease in her mother; Hypertension in her brother; Leukemia in her brother; Skin cancer in her daughter and mother.  Allergies  Allergen Reactions   Morphine And Codeine Shortness Of Breath   Ceftin [  Cefuroxime] Other (See Comments)    Difficulty urinating   Trazodone Hcl Swelling   Bupropion Hcl Other (See Comments)    "does not feel right"   Ezetimibe Other (See Comments)    REACTION: legs burning   Metformin And Related Diarrhea    Diarrhea w/XR or regular    Naproxen Other (See Comments)    Upset stomach, diarrhea   Tramadol Hcl Palpitations    REACTION: palpitations       PHYSICAL EXAMINATION: Vital signs: BP 132/70   Pulse  69   Temp 98.1 F (36.7 C)   Resp 11   Ht 5\' 2"  (1.575 m)   Wt 169 lb (76.7 kg)   LMP  (LMP Unknown)   SpO2 98%   BMI 30.91 kg/m  General: Well-developed, well-nourished, no acute distress HEENT: Sclerae are anicteric, conjunctiva pink. Oral mucosa intact Lungs: Clear Heart: Regular Abdomen: soft, nontender, nondistended, no obvious ascites, no peritoneal signs, normal bowel sounds. No organomegaly. Extremities: No edema Psychiatric: alert and oriented x3. Cooperative     ASSESSMENT:  History of adenomatous polyps   PLAN:   Surveillance colonoscopy

## 2023-09-04 NOTE — Op Note (Signed)
[facility] Patient Name: Amanda Arnold Procedure Date: 09/04/2023 10:34 AM MRN: [id number] Endoscopist: Wilhemina Bonito. Marina Goodell , MD, [id number] Age: 78 Referring MD:  Date of Birth: 1946-05-20 Gender: Female Account #: [id number] Procedure:                Colonoscopy with cold snare polypectomy x 3 Indications:              High risk colon cancer surveillance: Personal                            history of non-advanced adenoma 2016 Medicines:                Monitored Anesthesia Care Procedure:                Pre-Anesthesia Assessment:                           - Prior to the procedure, a History and Physical                            was performed, and patient medications and                            allergies were reviewed. The patient's tolerance of                            previous anesthesia was also reviewed. The risks                            and benefits of the procedure and the sedation                            options and risks were discussed with the patient.                            All questions were answered, and informed consent                            was obtained. Prior Anticoagulants: The patient has                            taken no anticoagulant or antiplatelet agents. ASA                            Grade Assessment: II - A patient with mild systemic                            disease. After reviewing the risks and benefits,                            the patient was deemed in satisfactory condition to                            undergo the procedure.  After obtaining informed consent, the colonoscope                            was passed under direct vision. Throughout the                            procedure, the patient's blood pressure, pulse, and                            oxygen saturations were monitored continuously. The                            Olympus Scope [id number] was introduced through the                             anus and advanced to the the cecum, identified by                            appendiceal orifice and ileocecal valve. The                            ileocecal valve, appendiceal orifice, and rectum                            were photographed. The quality of the bowel                            preparation was excellent. The colonoscopy was                            performed without difficulty. The patient tolerated                            the procedure well. The bowel preparation used was                            SUPREP via split dose instruction. Scope In: 10:43:22 AM Scope Out: 10:57:37 AM Scope Withdrawal Time: 0 hours 11 minutes 9 seconds  Total Procedure Duration: 0 hours 14 minutes 15 seconds  Findings:                 Three polyps were found in the ascending colon. The                            polyps were 1 to 3 mm in size. These polyps were                            removed with a cold snare. Resection and retrieval                            were complete.                           Multiple diverticula were found in the transverse  colon and left colon.                           The exam was otherwise without abnormality on                            direct and retroflexion views. Complications:            No immediate complications. Estimated blood loss:                            None. Estimated Blood Loss:     Estimated blood loss: none. Impression:               - Three 1 to 3 mm polyps in the ascending colon,                            removed with a cold snare. Resected and retrieved.                           - Diverticulosis in the transverse colon and in the                            left colon.                           - The examination was otherwise normal on direct                            and retroflexion views. Recommendation:           - Repeat colonoscopy is not recommended for                             surveillance.                           - Patient has a contact number available for                            emergencies. The signs and symptoms of potential                            delayed complications were discussed with the                            patient. Return to normal activities tomorrow.                            Written discharge instructions were provided to the                            patient.                           - Resume previous diet.                           -  Continue present medications.                           - Await pathology results. Wilhemina Bonito. Marina Goodell, MD 09/04/2023 11:03:48 AM This report has been signed electronically.

## 2023-09-04 NOTE — Progress Notes (Signed)
 Called to room to assist during endoscopic procedure.  Patient ID and intended procedure confirmed with present staff. Received instructions for my participation in the procedure from the performing physician.

## 2023-09-04 NOTE — Patient Instructions (Signed)

## 2023-09-04 NOTE — Progress Notes (Signed)
 Pt's states no medical or surgical changes since previsit or office visit.

## 2023-09-07 ENCOUNTER — Telehealth: Payer: Self-pay

## 2023-09-07 NOTE — Telephone Encounter (Signed)
  Follow up Call-     09/04/2023    9:54 AM  Call back number  Post procedure Call Back phone  # Angela-call daughter [phone]  Permission to leave phone message Yes     Patient questions:  Do you have a fever, pain , or abdominal swelling? No. Pain Score  0 *  Have you tolerated food without any problems? Yes.    Have you been able to return to your normal activities? Yes.    Do you have any questions about your discharge instructions: Diet   No. Medications  No. Follow up visit  No.  Do you have questions or concerns about your Care? No.  Actions: * If pain score is 4 or above: No action needed, pain <4.

## 2023-09-08 ENCOUNTER — Other Ambulatory Visit: Payer: Self-pay | Admitting: Internal Medicine

## 2023-09-08 ENCOUNTER — Encounter: Payer: Self-pay | Admitting: Internal Medicine

## 2023-09-08 DIAGNOSIS — Z1231 Encounter for screening mammogram for malignant neoplasm of breast: Secondary | ICD-10-CM

## 2023-09-08 LAB — SURGICAL PATHOLOGY

## 2023-09-18 ENCOUNTER — Ambulatory Visit
Admission: RE | Admit: 2023-09-18 | Discharge: 2023-09-18 | Disposition: A | Discharge status: Home / Self Care | Payer: Medicare Other | Source: Ambulatory Visit | Attending: Internal Medicine | Admitting: Internal Medicine

## 2023-09-18 DIAGNOSIS — Z1231 Encounter for screening mammogram for malignant neoplasm of breast: Secondary | ICD-10-CM | POA: Diagnosis not present

## 2023-10-01 ENCOUNTER — Emergency Department (HOSPITAL_BASED_OUTPATIENT_CLINIC_OR_DEPARTMENT_OTHER): Admitting: Radiology

## 2023-10-01 ENCOUNTER — Encounter (HOSPITAL_BASED_OUTPATIENT_CLINIC_OR_DEPARTMENT_OTHER): Payer: Self-pay | Admitting: Emergency Medicine

## 2023-10-01 ENCOUNTER — Emergency Department (HOSPITAL_BASED_OUTPATIENT_CLINIC_OR_DEPARTMENT_OTHER)
Admission: EM | Admit: 2023-10-01 | Discharge: 2023-10-01 | Disposition: A | Discharge status: Home / Self Care | Attending: Emergency Medicine | Admitting: Emergency Medicine

## 2023-10-01 ENCOUNTER — Other Ambulatory Visit: Payer: Self-pay

## 2023-10-01 DIAGNOSIS — M25562 Pain in left knee: Secondary | ICD-10-CM | POA: Insufficient documentation

## 2023-10-01 DIAGNOSIS — W01198A Fall on same level from slipping, tripping and stumbling with subsequent striking against other object, initial encounter: Secondary | ICD-10-CM | POA: Diagnosis not present

## 2023-10-01 DIAGNOSIS — S20212A Contusion of left front wall of thorax, initial encounter: Secondary | ICD-10-CM | POA: Diagnosis not present

## 2023-10-01 DIAGNOSIS — R0789 Other chest pain: Secondary | ICD-10-CM | POA: Diagnosis not present

## 2023-10-01 DIAGNOSIS — S29001A Unspecified injury of muscle and tendon of front wall of thorax, initial encounter: Secondary | ICD-10-CM | POA: Diagnosis present

## 2023-10-01 DIAGNOSIS — W19XXXA Unspecified fall, initial encounter: Secondary | ICD-10-CM

## 2023-10-01 MED ORDER — IBUPROFEN 800 MG PO TABS
800.0000 mg | ORAL_TABLET | Freq: Once | ORAL | Status: AC
  Administered 2023-10-01: 800 mg via ORAL
  Filled 2023-10-01: qty 1

## 2023-10-01 MED ORDER — HYDROCODONE-ACETAMINOPHEN 5-325 MG PO TABS
1.0000 | ORAL_TABLET | Freq: Once | ORAL | Status: AC
  Administered 2023-10-01: 1 via ORAL
  Filled 2023-10-01: qty 1

## 2023-10-01 NOTE — Discharge Instructions (Addendum)
 As we discussed, your x-rays were normal.  I would like for you to follow-up with your primary care doctor in 1 week.  Please use incentive spirometer like we went over today.  You can take your pain medication that you have at home.  You can also take 600 mg of ibuprofen every 6 hours.  Please return to the emergency department for any worsening symptoms.

## 2023-10-01 NOTE — ED Notes (Signed)
Discharge instructions, follow up care, and pain management reviewed and explained, pt verbalized understanding and had no further questions on d/c.  

## 2023-10-01 NOTE — ED Triage Notes (Signed)
 Mechanical fall this morning. C/o left rib cage and left knee pain. States unable to ambulate d/t pain. Pain in ribs worse when taking deep breath. Denies hitting head and denies thinners.

## 2023-10-01 NOTE — ED Notes (Signed)
 RT Note: Patient educated on  the use of an incentive spirometer

## 2023-10-01 NOTE — ED Provider Notes (Signed)
 [place] EMERGENCY DEPARTMENT AT [facility] Provider Note   CSN: [id number] Arrival date & time: 10/01/23  1516     History Chief Complaint  Patient presents with   Marletta Lor    Amanda Arnold is a 78 y.o. female patient who presents to the emergency department today for further evaluation of mechanical trip and fall.  Patient was walking back into her room which she tripped and fell over one of the tables.  She states her left foot got caught causing her to fall forward.  She fell forward and hit her chest and left knee on the ground.  She did not hit her head or lose consciousness.  She is not anticoagulated.  She is complaining of left knee pain and left anterior/lateral chest wall pain.   Fall       Home Medications Prior to Admission medications   Medication Sig Start Date End Date Taking? Authorizing Provider  ALPRAZolam Prudy Feeler) 0.5 MG tablet Take 1 tablet (0.5 mg total) by mouth 2 (two) times daily as needed for anxiety. 07/16/23   Plotnikov, Georgina Quint, MD  Cholecalciferol (VITAMIN D3) 50 MCG (2000 UT) capsule Take 1 capsule (2,000 Units total) by mouth daily. 10/08/21   Plotnikov, Georgina Quint, MD  Cyanocobalamin (VITAMIN B-12) 1000 MCG SUBL Place 1 tablet (1,000 mcg total) under the tongue daily. 10/08/21   Plotnikov, Georgina Quint, MD  HYDROcodone-acetaminophen (NORCO) 7.5-325 MG tablet Take 1 tablet by mouth every 8 (eight) hours as needed for severe pain (pain score 7-10). 07/16/23   Plotnikov, Georgina Quint, MD  ibuprofen (ADVIL) 200 MG tablet Take 200 mg by mouth every 6 (six) hours as needed for mild pain or moderate pain.    [provider]  rosuvastatin (CRESTOR) 10 MG tablet TAKE 1 TABLET(10 MG) BY MOUTH DAILY 01/27/23   Jake Bathe, MD      Allergies    Morphine and codeine, Ceftin [cefuroxime], Trazodone hcl, Bupropion hcl, Ezetimibe, Metformin and related, Naproxen, and Tramadol hcl    Review of Systems   Review of Systems  All other systems reviewed and  are negative.   Physical Exam Updated Vital Signs BP (!) 141/58 (BP Location: Right Arm)   Pulse 70   Temp 97.8 F (36.6 C) (Oral)   Resp 18   Ht 5\' 2"  (1.575 m)   Wt 76.7 kg   LMP  (LMP Unknown)   SpO2 97%   BMI 30.91 kg/m  Physical Exam Vitals and nursing note reviewed.  Constitutional:      Appearance: Normal appearance.  HENT:     Head: Normocephalic and atraumatic.  Eyes:     General:        Right eye: No discharge.        Left eye: No discharge.     Conjunctiva/sclera: Conjunctivae normal.  Pulmonary:     Effort: Pulmonary effort is normal.  Chest:     Comments: There is tenderness to palpation over the left anterior chest wall under the breast and lateral chest wall.  There is no bruising.  Equal chest rise. Musculoskeletal:     Comments: Full passive range of motion without pain in the left knee.  Left knee is nontender to palpation.  Skin:    General: Skin is warm and dry.     Findings: No rash.  Neurological:     General: No focal deficit present.     Mental Status: She is alert.  Psychiatric:  Mood and Affect: Mood normal.        Behavior: Behavior normal.     ED Results / Procedures / Treatments   Labs (all labs ordered are listed, but only abnormal results are displayed) Labs Reviewed - No data to display  EKG None  Radiology DG Ribs Unilateral W/Chest Left Result Date: 10/01/2023 CLINICAL DATA:  Left chest wall pain after fall. EXAM: LEFT RIBS AND CHEST - 3+ VIEW COMPARISON:  February 18, 2022. FINDINGS: No fracture or other bone lesions are seen involving the ribs. There is no evidence of pneumothorax or pleural effusion. Both lungs are clear. Heart size and mediastinal contours are within normal limits. IMPRESSION: Negative. Electronically Signed   By: Lupita Raider M.D.   On: 10/01/2023 16:45   DG Knee Complete 4 Views Left Result Date: 10/01/2023 CLINICAL DATA:  Left knee pain after fall. EXAM: LEFT KNEE - COMPLETE 4+ VIEW COMPARISON:   None Available. FINDINGS: No evidence of fracture, dislocation, or joint effusion. No evidence of arthropathy or other focal bone abnormality. Soft tissues are unremarkable. IMPRESSION: Negative. Electronically Signed   By: Lupita Raider M.D.   On: 10/01/2023 16:39    Procedures Procedures    Medications Ordered in ED Medications  HYDROcodone-acetaminophen (NORCO/VICODIN) 5-325 MG per tablet 1 tablet (1 tablet Oral Given 10/01/23 1653)  ibuprofen (ADVIL) tablet 800 mg (800 mg Oral Given 10/01/23 1653)    ED Course/ Medical Decision Making/ A&P Clinical Course as of 10/01/23 1659  Thu Oct 01, 2023  1644 This is a 78 year old female presenting from home with mechanical fall today, landing on her left knee and also striking her left ribs.  No head injury or loss of consciousness.  She is on a blood thinners.  She is having pleuritic left anterior chest wall pain, reproducible tenderness, pain worse with lying flat.  On exam the patient does have anterior mid clavicular tenderness at approximately rib 7 through 9, no visible deformities.  Her respiratory rate is normal and she is not hypoxic.  She is pending x-ray of the knee as well as the chest to evaluate for potential fracture.  Have a high clinical suspicion for rib fracture.  She was able to perform full range of motion testing of her left knee and does have some left lateral knee tenderness, without significant effusion. [MT]  1645 Patient takes hydrocodone 7.5 mg at home intermittently for pain.  We have ordered oral pain medication here.   [MT]  1648 I went over imaging with the patient at the bedside.  Offered patient knee brace which she declined.  Going over incentive spirometry in the room.  Patient has narcotic pain medication at home.  She will follow-up with her primary care doctor in 1 week. [CF]    Clinical Course User Index [CF] Teressa Lower, PA-C [MT] Renaye Rakers Kermit Balo, MD   {   Click here for ABCD2, HEART and other  calculators  Medical Decision Making JOVANNI ECKHART is a 78 y.o. female patient who presents to the emergency department today for further evaluation of left knee pain and left anterior chest wall pain after mechanical trip and fall.  No evidence of flail chest or obvious step-off or deformity.  Will likely get imaging over the left chest wall to look for any rib fractures.  Patient is able to move the left knee.  Pain is primarily with ambulation.  Will get an x-ray over the left knee as well.  Do  not feel that formal imaging is warranted at this time over the head and neck.  Will treat as rib contusions with incentive spirometry and NSAIDs.  She has narcotic pain medication at home.  I will have her follow-up with her primary care doctor in 1 week.  Offered patient knee brace which she declined.  Strict turn precautions were discussed.  She is safe for discharge at this time.  Amount and/or Complexity of Data Reviewed Radiology: ordered.  Risk Prescription drug management.    Final Clinical Impression(s) / ED Diagnoses Final diagnoses:  Fall, initial encounter  Contusion of rib on left side, initial encounter    Rx / DC Orders ED Discharge Orders     None         Teressa Lower, [place] 10/01/23 1659    Terald Sleeper, MD 10/01/23 2016

## 2023-10-06 ENCOUNTER — Encounter: Payer: Self-pay | Admitting: Internal Medicine

## 2023-10-15 ENCOUNTER — Ambulatory Visit (INDEPENDENT_AMBULATORY_CARE_PROVIDER_SITE_OTHER): Admitting: Emergency Medicine

## 2023-10-15 ENCOUNTER — Encounter: Payer: Self-pay | Admitting: Emergency Medicine

## 2023-10-15 ENCOUNTER — Ambulatory Visit: Payer: Medicare Other | Admitting: Internal Medicine

## 2023-10-15 VITALS — BP 140/72 | HR 86 | Temp 98.8°F | Ht 62.0 in | Wt 171.4 lb

## 2023-10-15 DIAGNOSIS — E1169 Type 2 diabetes mellitus with other specified complication: Secondary | ICD-10-CM | POA: Diagnosis not present

## 2023-10-15 DIAGNOSIS — E669 Obesity, unspecified: Secondary | ICD-10-CM | POA: Diagnosis not present

## 2023-10-15 DIAGNOSIS — T07XXXA Unspecified multiple injuries, initial encounter: Secondary | ICD-10-CM | POA: Diagnosis not present

## 2023-10-15 LAB — POCT GLYCOSYLATED HEMOGLOBIN (HGB A1C): Hemoglobin A1C: 6.7 % — AB (ref 4.0–5.6)

## 2023-10-15 NOTE — Patient Instructions (Signed)
Contusion A contusion is a deep bruise. This is a result of an injury that causes bleeding under the skin. Symptoms of bruising include pain, swelling, and discolored skin. The skin may turn blue, purple, or yellow. Follow these instructions at home: Managing pain, stiffness, and swelling You may use RICE. This stands for: Resting. Icing. Compression, or putting pressure on the injured area. Elevating, or raising the injured area. To follow this method, do these actions: Rest the injured area. If told, put ice on the injured area. To do this: Put ice in a plastic bag. Place a towel between your skin and the bag. Leave the ice on for 20 minutes, 2-3 times per day. If your skin turns bright red, take off the ice right away to prevent skin damage. The risk of skin damage is higher if you cannot feel pain, heat, or cold. If told, apply compression on the injured area using an elastic bandage. Make sure the bandage is not too tight. If the area tingles or has a loss of feeling (numbness), remove it and put it back on as told by your doctor. If possible, elevate the injured area above the level of your heart while you are sitting or lying down.  General instructions Take over-the-counter and prescription medicines only as told by your doctor. Keep all follow-up visits. Your doctor may want to see how your contusion is healing with treatment. Contact a doctor if: Your symptoms do not get better after several days of treatment. Your symptoms get worse. You have trouble moving the injured area. Get help right away if: You have very bad pain. You have a loss of feeling (numbness) in a hand or foot. Your hand or foot turns pale or cold. This information is not intended to replace advice given to you by your health care provider. Make sure you discuss any questions you have with your health care provider. Document Revised: 12/16/2021 Document Reviewed: 12/16/2021 Elsevier Patient Education  2024  [employer].

## 2023-10-15 NOTE — Assessment & Plan Note (Signed)
 Clinically stable.  No red flag signs or symptoms. Recovering well.  Still sore but better. Pain management discussed. Advised to follow-up with PCP if clinical picture changes or gets worse over the next couple weeks.

## 2023-10-15 NOTE — Progress Notes (Signed)
 Amanda Arnold 78 y.o.   Chief Complaint  Patient presents with   Hospitalization Follow-up    Patient had a fall on 3/20. She did not hit her head. States nothing was broken, and does feel better still feel sore. She mentions wanting her sugar checked because its been awhile     HISTORY OF PRESENT ILLNESS: This is a 78 y.o. female here for follow-up of emergency department visit on 10/01/2023 when she presented after a fall.  Sustained multiple soft tissue injuries.  No broken bones.  Feels better today.  Also wants to have her blood sugar checked.  HPI   Prior to Admission medications   Medication Sig Start Date End Date Taking? Authorizing Provider  ALPRAZolam Prudy Feeler) 0.5 MG tablet Take 1 tablet (0.5 mg total) by mouth 2 (two) times daily as needed for anxiety. 07/16/23  Yes Plotnikov, Georgina Quint, MD  Cholecalciferol (VITAMIN D3) 50 MCG (2000 UT) capsule Take 1 capsule (2,000 Units total) by mouth daily. 10/08/21  Yes Plotnikov, Georgina Quint, MD  Cyanocobalamin (VITAMIN B-12) 1000 MCG SUBL Place 1 tablet (1,000 mcg total) under the tongue daily. 10/08/21  Yes Plotnikov, Georgina Quint, MD  HYDROcodone-acetaminophen (NORCO) 7.5-325 MG tablet Take 1 tablet by mouth every 8 (eight) hours as needed for severe pain (pain score 7-10). 07/16/23  Yes Plotnikov, Georgina Quint, MD  ibuprofen (ADVIL) 200 MG tablet Take 200 mg by mouth every 6 (six) hours as needed for mild pain or moderate pain.   Yes [provider]  rosuvastatin (CRESTOR) 10 MG tablet TAKE 1 TABLET(10 MG) BY MOUTH DAILY 01/27/23  Yes Jake Bathe, MD    Allergies  Allergen Reactions   Morphine And Codeine Shortness Of Breath   Ceftin [Cefuroxime] Other (See Comments)    Difficulty urinating   Trazodone Hcl Swelling   Bupropion Hcl Other (See Comments)    "does not feel right"   Ezetimibe Other (See Comments)    REACTION: legs burning   Metformin And Related Diarrhea    Diarrhea w/XR or regular    Naproxen Other (See Comments)     Upset stomach, diarrhea   Tramadol Hcl Palpitations    REACTION: palpitations    Patient Active Problem List   Diagnosis Date Noted   Urgency of urination 04/15/2023   Hip pain, chronic, right 09/09/2022   COVID-19 07/10/2022   Low vitamin B12 level 04/09/2022   Hearing loss secondary to cerumen impaction, right 02/18/2022   Upper back pain on right side 02/18/2022   Right hip pain 01/24/2022   Pruritus of vulva 10/07/2021   Symptoms involving urinary system 10/07/2021   Grief at loss of child 07/02/2021   Hyperglycemia 07/02/2021   Hypotension 04/15/2021   Urinary frequency 04/15/2021   Genetic testing 12/04/2020   Family history of breast cancer 11/26/2020   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   Malnutrition of moderate degree 10/19/2020   Weight loss 09/04/2020   Cancer of pancreas, tail (HCC) 08/28/2020   Shoulder pain, right 07/10/2020   Coronary atherosclerosis 05/28/2020   Pulmonary nodule 05/28/2020   Palpitations 02/16/2020   Dysuria 07/27/2019   Piriformis syndrome 03/29/2019   Humerus fracture 01/27/2019   Closed fracture of left proximal humerus 12/07/2018   Trochanteric bursitis of right hip 07/12/2018   Trochanteric bursitis of both hips 03/23/2018   Neck pain 02/11/2018   Peroneal mononeuropathy, left 01/26/2017   Skin cyst 09/26/2016   Adjustment disorder with mixed anxiety and depressed mood 06/27/2016   Fatigue 03/28/2016  Restless leg syndrome 03/28/2016   Right lower quadrant abdominal pain 08/15/2015   Well adult exam 07/03/2015   Meralgia paraesthetica 05/29/2015   Paresthesia 05/15/2015   Sciatic leg pain 09/22/2014   Rash and nonspecific skin eruption 09/11/2014   Right lower quadrant pain 09/11/2014   Nonspecific abnormal electrocardiogram (ECG) (EKG) 07/27/2014   Precordial pain 07/27/2014   Cerumen impaction 06/16/2014   Chest pain, atypical 03/03/2014   UTI (urinary tract infection) 03/29/2013   Herpes zoster 03/02/2013   Abscess of left  leg 02/02/2013   Leg pain, bilateral 12/09/2012   Left groin pain 09/09/2012   Sinusitis, chronic 08/25/2012   Chest wall pain 05/20/2012   Nocturia 12/24/2011   Psychosomatic pain 10/14/2011   Left shoulder pain 08/22/2011   Vertigo 06/13/2011   Syncopal vertigo 06/13/2011   Intertriginous candidiasis 03/07/2011   Type 2 diabetes mellitus with obesity (HCC) 03/07/2011   HIP PAIN 06/17/2010   HYPERGLYCEMIA 05/15/2010   CENTRAL SLEEP APNEA CONDS CLASSIFIED ELSEWHERE 05/14/2010   Obstructive sleep apnea 04/17/2010   Obesity 04/10/2010   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   DYSPNEA 03/15/2010   TRIGGER FINGER 12/18/2009   Abdominal pain, epigastric 12/18/2009   KNEE PAIN 07/11/2009   NEURALGIA, 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/2009   COLD SORE 05/11/2009   T M J 05/11/2009   BREAST PAIN 04/13/2009   Edema 03/01/2009   CERVICAL LYMPHADENOPATHY 03/01/2009   PRURITUS 07/28/2008   TACHYCARDIA 07/28/2008   INSOMNIA, PERSISTENT 02/22/2008   Cystitis 02/22/2008   SWEATING 01/18/2008   BRONCHITIS, ACUTE 12/08/2007   Chronic fatigue 12/08/2007   Headache(784.0) 12/08/2007   Cough 12/08/2007   ARTHRALGIA 11/19/2007   LOW BACK PAIN 01/27/2007   Tobacco use disorder 11/28/2006   Generalized anxiety disorder 05/25/2006   Essential hypertension 05/25/2006   COPD exacerbation (HCC) 05/25/2006   Osteoarthritis 05/25/2006   Polyuria 05/25/2006   Dyslipidemia 04/30/2006   Myalgia 04/02/2006   Anxiety and depression 01/02/2006   Disturbance of skin sensation 05/29/2005   ESOTROPIA, LEFT EYE 1945/09/07    Past Medical History:  Diagnosis Date   Abdominal pain, epigastric 12/18/2009   ANXIETY 05/25/2006   BRONCHITIS, ACUTE 12/08/2007   Cataract    Complication of anesthesia     difficulty waking up x 1 - "years ago"   Constipation    COPD 05/25/2006   COVID-19 07/10/2022   DEPRESSION 01/02/2006   ESOTROPIA, LEFT EYE 04/17/1948   Family history of breast cancer 11/26/2020   FIBROMYALGIA  04/02/2006   GERD (gastroesophageal reflux disease)    Headache(784.0) 12/08/2007   History of blood transfusion    HYPERLIPIDEMIA 04/30/2006   HYPERTENSION 05/25/2006   Hypertension    INSOMNIA, PERSISTENT 02/22/2008   Lazy eye    left - no surgery   LOW BACK PAIN 01/27/2007   Migraines    NEURALGIA, 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/2009   Neuropathy    OBSTRUCTIVE SLEEP APNEA 04/17/2010   does not use CPAP   OSTEOARTHRITIS 05/25/2006   pancreatic ca dx'd 06/2020   Polyuria    Pre-diabetes    diet controlled   Psychosomatic disease 2011   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   T M J 05/11/2009    Past Surgical History:  Procedure Laterality Date   BIOPSY  08/16/2020   Procedure: BIOPSY;  Surgeon: Lemar Lofty., MD;  Location: [facility] ENDOSCOPY;  Service: Endoscopy;;   cataract surgery Bilateral    CHOLECYSTECTOMY     COLONOSCOPY     ESOPHAGOGASTRODUODENOSCOPY (EGD) WITH PROPOFOL N/A 08/16/2020   Procedure: ESOPHAGOGASTRODUODENOSCOPY (EGD)  WITH PROPOFOL;  Surgeon: Mansouraty, Netty Starring., MD;  Location: [facility] END[facility]COPY;  Service: Endoscopy;  Laterality: N/A;   EUS N/A 08/16/2020   Procedure: UPPER ENDOSCOPIC ULTRASOUND (EUS) RADIAL;  Surgeon: Meridee Score Netty Starring., MD;  Location: [facility] ENDOSCOPY;  Service: Endoscopy;  Laterality: N/A;   EYE SURGERY Bilateral    cataract with lens implant   FINE NEEDLE ASPIRATION  08/16/2020   Procedure: FINE NEEDLE ASPIRATION (FNA) LINEAR;  Surgeon: Lemar Lofty., MD;  Location: [facility] ENDOSCOPY;  Service: Endoscopy;;   MASS EXCISION Left 10/06/2016   Procedure: EXCISION LEFT BUTTOCKS MASS;  Surgeon: Ovidio Kin, MD;  Location: [facility];  Service: General;  Laterality: Left;   PORT-A-CATH REMOVAL N/A 06/13/2021   Procedure: PORT REMOVAL;  Surgeon: Almond Lint, MD;  Location: [facility] OR;  Service: General;  Laterality: N/A;   PORTACATH PLACEMENT N/A 11/14/2020   Procedure: INSERTION PORT-A-CATH;  Surgeon: Almond Lint, MD;  Location:  [facility] OR;  Service: General;  Laterality: N/A;   XI ROBOTIC ASSISTED LAPAROSCOPIC DISTAL PANCREATECTOMY N/A 10/17/2020   Procedure: XI ROBOTIC ASSISTED DISTAL PANCREATECTOMY AND SPLENECTOMY;  Surgeon: Almond Lint, MD;  Location: [facility] OR;  Service: General;  Laterality: N/A;    Social History   Socioeconomic History   Marital status: Married    Spouse name: Not on file   Number of children: 5   Years of education: Not on file   Highest education level: Not on file  Occupational History   Occupation: house wife    Employer: UNEMPLOYED  Tobacco Use   Smoking status: Former    Current packs/day: 0.00    Types: Cigarettes    Quit date: 07/10/2013    Years since quitting: 10.2   Smokeless tobacco: Current    Types: Snuff  Vaping Use   Vaping status: Never Used  Substance and Sexual Activity   Alcohol use: No    Alcohol/week: 0.0 standard drinks of alcohol   Drug use: No   Sexual activity: Not Currently    Birth control/protection: Post-menopausal  Other Topics Concern   Not on file  Social History Narrative   Not on file   Social Drivers of Health   Financial Resource Strain: Low Risk  (12/22/2022)   Overall Financial Resource Strain (CARDIA)    Difficulty of Paying Living Expenses: Not hard at all  Food Insecurity: No Food Insecurity (12/22/2022)   Hunger Vital Sign    Worried About Running Out of Food in the Last Year: Never true    Ran Out of Food in the Last Year: Never true  Transportation Needs: No Transportation Needs (12/22/2022)   PRAPARE - Administrator, Civil Service (Medical): No    Lack of Transportation (Non-Medical): No  Physical Activity: Insufficiently Active (12/22/2022)   Exercise Vital Sign    Days of Exercise per Week: 7 days    Minutes of Exercise per Session: 20 min  Stress: No Stress Concern Present (12/22/2022)   [employer] of Occupational Health - Occupational Stress Questionnaire    Feeling of Stress : Not at all  Social  Connections: Socially Integrated (12/22/2022)   Social Connection and Isolation Panel [NHANES]    Frequency of Communication with Friends and Family: More than three times a week    Frequency of Social Gatherings with Friends and Family: More than three times a week    Attends Religious Services: More than 4 times per year    Active Member of [employer] or Organizations: Yes  Attends Banker Meetings: More than 4 times per year    Marital Status: Married  Catering manager Violence: Not At Risk (12/22/2022)   Humiliation, Afraid, Rape, and Kick questionnaire    Fear of Current or Ex-Partner: No    Emotionally Abused: No    Physically Abused: No    Sexually Abused: No    Family History  Problem Relation Age of Onset   Heart disease Mother    Cervical cancer Mother        dx unknown age   Skin cancer Mother        dx unknown age   Heart attack Father    Hypertension Brother    Leukemia Brother        d. >50   Breast cancer Maternal Aunt        dx after 50   Cancer Maternal Uncle        ? colon; dx after 50   Skin cancer Daughter        basal cell carcinoma   Colon cancer Neg Hx    Stomach cancer Neg Hx    Colon polyps Neg Hx    Esophageal cancer Neg Hx    Rectal cancer Neg Hx      Review of Systems  Constitutional: Negative.  Negative for chills and fever.  HENT: Negative.  Negative for congestion and sore throat.   Respiratory: Negative.  Negative for cough and shortness of breath.   Cardiovascular: Negative.  Negative for chest pain and palpitations.  Gastrointestinal:  Negative for abdominal pain, diarrhea, nausea and vomiting.  Genitourinary: Negative.  Negative for dysuria and hematuria.  Skin: Negative.  Negative for rash.  Neurological: Negative.  Negative for dizziness and headaches.  All other systems reviewed and are negative.   Vitals:   10/15/23 1514  BP: (!) 140/72  Pulse: 86  Temp: 98.8 F (37.1 C)  SpO2: 95%    Physical Exam Vitals  reviewed.  Constitutional:      Appearance: Normal appearance.  HENT:     Head: Normocephalic.     Mouth/Throat:     Mouth: Mucous membranes are moist.     Pharynx: Oropharynx is clear.  Eyes:     Extraocular Movements: Extraocular movements intact.     Conjunctiva/sclera: Conjunctivae normal.     Pupils: Pupils are equal, round, and reactive to light.  Cardiovascular:     Rate and Rhythm: Normal rate and regular rhythm.     Pulses: Normal pulses.     Heart sounds: Normal heart sounds.  Pulmonary:     Effort: Pulmonary effort is normal.     Breath sounds: Normal breath sounds.  Abdominal:     Palpations: Abdomen is soft.     Tenderness: There is no abdominal tenderness.  Musculoskeletal:     Cervical back: No tenderness.  Lymphadenopathy:     Cervical: No cervical adenopathy.  Skin:    General: Skin is warm and dry.     Capillary Refill: Capillary refill takes less than 2 seconds.  Neurological:     General: No focal deficit present.     Mental Status: She is alert and oriented to person, place, and time.  Psychiatric:        Mood and Affect: Mood normal.        Behavior: Behavior normal.    Results for orders placed or performed in visit on 10/15/23 (from the past 24 hours)  POCT HgB A1C  Status: Abnormal   Collection Time: 10/15/23  3:25 PM  Result Value Ref Range   Hemoglobin A1C 6.7 (A) 4.0 - 5.6 %   HbA1c POC (<> result, manual entry)     HbA1c, POC (prediabetic range)     HbA1c, POC (controlled diabetic range)       ASSESSMENT & PLAN: A total of 34 minutes was spent with the patient and counseling/coordination of care regarding preparing for this visit, review of most recent office visit notes, review of multiple chronic medical conditions under management, review of all medications, diagnosis of multiple contusions and pain management, review of most recent blood work results including interpretation of today's hemoglobin A1c, prognosis, documentation, need  for follow-up with PCP.  Problem List Items Addressed This Visit       Endocrine   Type 2 diabetes mellitus with obesity (HCC)   Clinically stable. Hemoglobin A1c is 6.7.  Diet controlled. No concerns.      Relevant Orders   POCT HgB A1C (Completed)     Other   Multiple contusions - Primary   Clinically stable.  No red flag signs or symptoms. Recovering well.  Still sore but better. Pain management discussed. Advised to follow-up with PCP if clinical picture changes or gets worse over the next couple weeks.      Patient Instructions  Contusion A contusion is a deep bruise. This is a result of an injury that causes bleeding under the skin. Symptoms of bruising include pain, swelling, and discolored skin. The skin may turn blue, purple, or yellow. Follow these instructions at home: Managing pain, stiffness, and swelling You may use RICE. This stands for: Resting. Icing. Compression, or putting pressure on the injured area. Elevating, or raising the injured area. To follow this method, do these actions: Rest the injured area. If told, put ice on the injured area. To do this: Put ice in a plastic bag. Place a towel between your skin and the bag. Leave the ice on for 20 minutes, 2-3 times per day. If your skin turns bright red, take off the ice right away to prevent skin damage. The risk of skin damage is higher if you cannot feel pain, heat, or cold. If told, apply compression on the injured area using an elastic bandage. Make sure the bandage is not too tight. If the area tingles or has a loss of feeling (numbness), remove it and put it back on as told by your doctor. If possible, elevate the injured area above the level of your heart while you are sitting or lying down.  General instructions Take over-the-counter and prescription medicines only as told by your doctor. Keep all follow-up visits. Your doctor may want to see how your contusion is healing with  treatment. Contact a doctor if: Your symptoms do not get better after several days of treatment. Your symptoms get worse. You have trouble moving the injured area. Get help right away if: You have very bad pain. You have a loss of feeling (numbness) in a hand or foot. Your hand or foot turns pale or cold. This information is not intended to replace advice given to you by your health care provider. Make sure you discuss any questions you have with your health care provider. Document Revised: 12/16/2021 Document Reviewed: 12/16/2021 Elsevier Patient Education  2024 Elsevier Inc.    Edwina Barth, MD  Primary Care at [facility]

## 2023-10-15 NOTE — Assessment & Plan Note (Signed)
 Clinically stable. Hemoglobin A1c is 6.7.  Diet controlled. No concerns.

## 2023-11-05 DIAGNOSIS — H26492 Other secondary cataract, left eye: Secondary | ICD-10-CM | POA: Diagnosis not present

## 2023-11-05 DIAGNOSIS — H524 Presbyopia: Secondary | ICD-10-CM | POA: Diagnosis not present

## 2023-11-05 DIAGNOSIS — H52203 Unspecified astigmatism, bilateral: Secondary | ICD-10-CM | POA: Diagnosis not present

## 2023-11-05 DIAGNOSIS — H04123 Dry eye syndrome of bilateral lacrimal glands: Secondary | ICD-10-CM | POA: Diagnosis not present

## 2023-11-05 DIAGNOSIS — E119 Type 2 diabetes mellitus without complications: Secondary | ICD-10-CM | POA: Diagnosis not present

## 2023-11-05 DIAGNOSIS — H43813 Vitreous degeneration, bilateral: Secondary | ICD-10-CM | POA: Diagnosis not present

## 2023-11-18 ENCOUNTER — Encounter (HOSPITAL_COMMUNITY): Payer: Self-pay

## 2023-11-25 ENCOUNTER — Ambulatory Visit (INDEPENDENT_AMBULATORY_CARE_PROVIDER_SITE_OTHER)

## 2023-11-25 VITALS — Ht 62.0 in | Wt 171.0 lb

## 2023-11-25 DIAGNOSIS — E1169 Type 2 diabetes mellitus with other specified complication: Secondary | ICD-10-CM | POA: Diagnosis not present

## 2023-11-25 DIAGNOSIS — Z0000 Encounter for general adult medical examination without abnormal findings: Secondary | ICD-10-CM | POA: Diagnosis not present

## 2023-11-25 DIAGNOSIS — Z1159 Encounter for screening for other viral diseases: Secondary | ICD-10-CM | POA: Diagnosis not present

## 2023-11-25 DIAGNOSIS — E669 Obesity, unspecified: Secondary | ICD-10-CM | POA: Diagnosis not present

## 2023-11-25 NOTE — Patient Instructions (Signed)
 Amanda Arnold , Thank you for taking time out of your busy schedule to complete your Annual Wellness Visit with me. I enjoyed our conversation and look forward to speaking with you again next year. I, as well as your care team,  appreciate your ongoing commitment to your health goals. Please review the following plan we discussed and let me know if I can assist you in the future. Your Game plan/ To Do List     Follow up Visits: Next Medicare AWV with our clinical staff: 11/25/2024.   Have you seen your provider in the last 6 months (3 months if uncontrolled diabetes)? Yes Next Office Visit with your provider: 12/14/2023.  Clinician Recommendations:  Aim for 30 minutes of exercise or brisk walking, 6-8 glasses of water, and 5 servings of fruits and vegetables each day. You are due for a diabetic foot exam, a Hep C screening and a kidney evaluation.  These will be done during your next office visit.       This is a list of the screening recommended for you and due dates:  Health Maintenance  Topic Date Due   Yearly kidney health urinalysis for diabetes  Never done   Eye exam for diabetics  11/04/2023   Medicare Annual Wellness Visit  12/22/2023   Yearly kidney function blood test for diabetes  04/14/2024   Hemoglobin A1C  04/15/2024   Complete foot exam   11/24/2024   DTaP/Tdap/Td vaccine (2 - Td or Tdap) 04/27/2029   Pneumonia Vaccine  Completed   DEXA scan (bone density measurement)  Completed   Hepatitis C Screening  Completed   HPV Vaccine  Aged Out   Meningitis B Vaccine  Aged Out   Flu Shot  Discontinued   Colon Cancer Screening  Discontinued   COVID-19 Vaccine  Discontinued   Zoster (Shingles) Vaccine  Discontinued    Advanced directives: (Declined) Advance directive discussed with you today. Even though you declined this today, please call our office should you change your mind, and we can give you the proper paperwork for you to fill out. Advance Care Planning is important because  it:  [x]  Makes sure you receive the medical care that is consistent with your values, goals, and preferences  [x]  It provides guidance to your family and loved ones and reduces their decisional burden about whether or not they are making the right decisions based on your wishes.  Follow the link provided in your after visit summary or read over the paperwork we have mailed to you to help you started getting your Advance Directives in place. If you need assistance in completing these, please reach out to us  so that we can help you!  See attachments for Preventive Care and Fall Prevention Tips.

## 2023-11-25 NOTE — Progress Notes (Signed)
 Subjective:   Amanda Arnold is a 78 y.o. who presents for a Medicare Wellness preventive visit.  As a reminder, Annual Wellness Visits don't include a physical exam, and some assessments may be limited, especially if this visit is performed virtually. We may recommend an in-person visit if needed.  Visit Complete: Virtual I connected with  Amanda Arnold on 11/25/23 by a audio enabled telemedicine application and verified that I am speaking with the correct person using two identifiers.  Patient Location: Home  Provider Location: Home Office  I discussed the limitations of evaluation and management by telemedicine. The patient expressed understanding and agreed to proceed.  Vital Signs: Because this visit was a virtual/telehealth visit, some criteria may be missing or patient reported. Any vitals not documented were not able to be obtained and vitals that have been documented are patient reported.  VideoDeclined- This patient declined Librarian, academic. Therefore the visit was completed with audio only.  Persons Participating in Visit: Patient.  AWV Questionnaire: No: Patient Medicare AWV questionnaire was not completed prior to this visit.  Cardiac Risk Factors include: advanced age (>67men, >16 women);hypertension;Other (see comment);dyslipidemia;diabetes mellitus;obesity (BMI >30kg/m2), Risk factor comments: OSA, COPD     Objective:     Today's Vitals   11/25/23 1128  Weight: 171 lb (77.6 kg)  Height: 5\' 2"  (1.575 m)  PainSc: 3    Body mass index is 31.28 kg/m.     11/25/2023   11:42 AM 10/01/2023    3:33 PM 07/21/2023    9:40 AM 01/23/2023    9:09 AM 12/22/2022    2:34 PM 08/28/2022    8:32 AM 04/25/2022    8:58 AM  Advanced Directives  Does Patient Have a Medical Advance Directive? No No No No No No No  Would patient like information on creating a medical advance directive?  No - Patient declined No - Patient declined No - Patient  declined No - Patient declined No - Patient declined No - Patient declined    Current Medications (verified) Outpatient Encounter Medications as of 11/25/2023  Medication Sig   ALPRAZolam  (XANAX ) 0.5 MG tablet Take 1 tablet (0.5 mg total) by mouth 2 (two) times daily as needed for anxiety.   Cholecalciferol (VITAMIN D3) 50 MCG (2000 UT) capsule Take 1 capsule (2,000 Units total) by mouth daily.   Cyanocobalamin  (VITAMIN B-12) 1000 MCG SUBL Place 1 tablet (1,000 mcg total) under the tongue daily.   HYDROcodone -acetaminophen  (NORCO) 7.5-325 MG tablet Take 1 tablet by mouth every 8 (eight) hours as needed for severe pain (pain score 7-10).   ibuprofen  (ADVIL ) 200 MG tablet Take 200 mg by mouth every 6 (six) hours as needed for mild pain or moderate pain.   ketoconazole  (NIZORAL ) 2 % shampoo Apply topically.   rosuvastatin  (CRESTOR ) 10 MG tablet TAKE 1 TABLET(10 MG) BY MOUTH DAILY   No facility-administered encounter medications on file as of 11/25/2023.    Allergies (verified) Morphine  and codeine , Ceftin  [cefuroxime ], Trazodone hcl, Bupropion hcl, Ezetimibe, Metformin  and related, Naproxen , and Tramadol hcl   History: Past Medical History:  Diagnosis Date   Abdominal pain, epigastric 12/18/2009   ANXIETY 05/25/2006   BRONCHITIS, ACUTE 12/08/2007   Cataract    Complication of anesthesia     difficulty waking up x 1 - "years ago"   Constipation    COPD 05/25/2006   COVID-19 07/10/2022   DEPRESSION 01/02/2006   ESOTROPIA, LEFT EYE 12/03/1947   Family history of breast cancer  11/26/2020   FIBROMYALGIA 04/02/2006   GERD (gastroesophageal reflux disease)    Headache(784.0) 12/08/2007   History of blood transfusion    HYPERLIPIDEMIA 04/30/2006   HYPERTENSION 05/25/2006   Hypertension    INSOMNIA, PERSISTENT 02/22/2008   Lazy eye    left - no surgery   LOW BACK PAIN 01/27/2007   Migraines    NEURALGIA, 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/2009   Neuropathy    OBSTRUCTIVE SLEEP APNEA 04/17/2010    does not use CPAP   OSTEOARTHRITIS 05/25/2006   pancreatic ca dx'd 06/2020   Polyuria    Pre-diabetes    diet controlled   Psychosomatic disease 2011   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   T M J 05/11/2009   Past Surgical History:  Procedure Laterality Date   BIOPSY  08/16/2020   Procedure: BIOPSY;  Surgeon: Normie Becton., MD;  Location: [facility] ENDOSCOPY;  Service: Endoscopy;;   cataract surgery Bilateral    CHOLECYSTECTOMY     COLONOSCOPY     ESOPHAGOGASTRODUODENOSCOPY (EGD) WITH PROPOFOL  N/A 08/16/2020   Procedure: ESOPHAGOGASTRODUODENOSCOPY (EGD) WITH PROPOFOL ;  Surgeon: Normie Becton., MD;  Location: [facility] ENDOSCOPY;  Service: Endoscopy;  Laterality: N/A;   EUS N/A 08/16/2020   Procedure: UPPER ENDOSCOPIC ULTRASOUND (EUS) RADIAL;  Surgeon: Brice Campi Albino Alu., MD;  Location: [facility] ENDOSCOPY;  Service: Endoscopy;  Laterality: N/A;   EYE SURGERY Bilateral    cataract with lens implant   FINE NEEDLE ASPIRATION  08/16/2020   Procedure: FINE NEEDLE ASPIRATION (FNA) LINEAR;  Surgeon: Normie Becton., MD;  Location: [facility] ENDOSCOPY;  Service: Endoscopy;;   MASS EXCISION Left 10/06/2016   Procedure: EXCISION LEFT BUTTOCKS MASS;  Surgeon: Juanita Norlander, MD;  Location: [facility];  Service: General;  Laterality: Left;   PORT-A-CATH REMOVAL N/A 06/13/2021   Procedure: PORT REMOVAL;  Surgeon: Lockie Rima, MD;  Location: [facility] OR;  Service: General;  Laterality: N/A;   PORTACATH PLACEMENT N/A 11/14/2020   Procedure: INSERTION PORT-A-CATH;  Surgeon: Lockie Rima, MD;  Location: [facility] OR;  Service: General;  Laterality: N/A;   XI ROBOTIC ASSISTED LAPAROSCOPIC DISTAL PANCREATECTOMY N/A 10/17/2020   Procedure: XI ROBOTIC ASSISTED DISTAL PANCREATECTOMY AND SPLENECTOMY;  Surgeon: Lockie Rima, MD;  Location: [facility] OR;  Service: General;  Laterality: N/A;   Family History  Problem Relation Age of Onset   Heart disease Mother    Cervical cancer Mother        dx unknown  age   Skin cancer Mother        dx unknown age   Heart attack Father    Hypertension Brother    Leukemia Brother        d. >50   Breast cancer Maternal Aunt        dx after 50   Cancer Maternal Uncle        ? colon; dx after 50   Skin cancer Daughter        basal cell carcinoma   Colon cancer Neg Hx    Stomach cancer Neg Hx    Colon polyps Neg Hx    Esophageal cancer Neg Hx    Rectal cancer Neg Hx    Social History   Socioeconomic History   Marital status: Widowed    Spouse name: Not on file   Number of children: 5   Years of education: Not on file   Highest education level: Not on file  Occupational History   Occupation: house wife    Employer: UNEMPLOYED  Tobacco Use  Smoking status: Former    Current packs/day: 0.00    Types: Cigarettes    Quit date: 07/10/2013    Years since quitting: 10.3   Smokeless tobacco: Current    Types: Snuff  Vaping Use   Vaping status: Never Used  Substance and Sexual Activity   Alcohol use: No    Alcohol/week: 0.0 standard drinks of alcohol   Drug use: No   Sexual activity: Not Currently    Birth control/protection: Post-menopausal  Other Topics Concern   Not on file  Social History Narrative   Lives alone with 1 dog/2025   Social Drivers of Health   Financial Resource Strain: Low Risk  (11/25/2023)   Overall Financial Resource Strain (CARDIA)    Difficulty of Paying Living Expenses: Not very hard  Food Insecurity: No Food Insecurity (11/25/2023)   Hunger Vital Sign    Worried About Running Out of Food in the Last Year: Never true    Ran Out of Food in the Last Year: Never true  Transportation Needs: No Transportation Needs (11/25/2023)   PRAPARE - Administrator, Civil Service (Medical): No    Lack of Transportation (Non-Medical): No  Physical Activity: Inactive (11/25/2023)   Exercise Vital Sign    Days of Exercise per Week: 0 days    Minutes of Exercise per Session: 0 min  Stress: No Stress Concern Present  (11/25/2023)   [employer] of Occupational Health - Occupational Stress Questionnaire    Feeling of Stress : Only a little  Social Connections: Moderately Integrated (11/25/2023)   Social Connection and Isolation Panel [NHANES]    Frequency of Communication with Friends and Family: More than three times a week    Frequency of Social Gatherings with Friends and Family: More than three times a week    Attends Religious Services: More than 4 times per year    Active Member of [employer] or Organizations: Yes    Attends Banker Meetings: Never    Marital Status: Widowed    Tobacco Counseling Ready to quit: Not Answered Counseling given: Not Answered    Clinical Intake:  Pre-visit preparation completed: Yes  Pain : 0-10 Pain Score: 3  Pain Type: Chronic pain Pain Location: Back (Rt hip/ down rt leg) Pain Orientation: Lower Pain Descriptors / Indicators: Aching, Discomfort Pain Onset: More than a month ago Pain Frequency: Intermittent Pain Relieving Factors: Hydrocodone  and Ibprofren Effect of Pain on Daily Activities: Walking a lot or standing for long  Pain Relieving Factors: Hydrocodone  and Ibprofren  BMI - recorded: 31.28 Nutritional Status: BMI > 30  Obese Nutritional Risks: None Diabetes: Yes CBG done?: No Did pt. bring in CBG monitor from home?: No  Lab Results  Component Value Date   HGBA1C 6.7 (A) 10/15/2023   HGBA1C 6.7 (H) 04/15/2023   HGBA1C 6.8 (H) 09/09/2022     How often do you need to have someone help you when you read instructions, pamphlets, or other written materials from your doctor or pharmacy?: 1 - Never  Interpreter Needed?: No  Information entered by :: Lindon Kiel, RMA   Activities of Daily Living     11/25/2023   11:28 AM 12/22/2022    2:37 PM  In your present state of health, do you have any difficulty performing the following activities:  Hearing? 1 0  Comment has wax build up   Vision? 0 0  Difficulty concentrating  or making decisions? 0 0  Walking or climbing stairs? 0 0  Dressing or bathing? 0 0  Doing errands, shopping? 0 0  Comment do not drive lond distances/daughter drives her to doctor   Preparing Food and eating ? N N  Using the Toilet? N N  In the past six months, have you accidently leaked urine? N N  Do you have problems with loss of bowel control? N N  Managing your Medications? N N  Managing your Finances? N N  Housekeeping or managing your Housekeeping? N N    Patient Care Team: Plotnikov, Oakley Bellman, MD as PCP - General Hugh Madura, MD as PCP - Cardiology (Cardiology) Manya Sells, MD as Consulting Physician (Neurosurgery) Hugh Madura, MD as Consulting Physician (Cardiology) Sumner Ends, MD as Consulting Physician (Oncology) Lockie Rima, MD as Consulting Physician (General Surgery) Szabat, Tino Foreman, [facility] (Inactive) as Pharmacist (Pharmacist) Rudine Cos, MD as Consulting Physician (Ophthalmology)  Indicate any recent Medical Services you may have received from other than [facility] providers in the past year (date may be approximate).     Assessment:    This is a routine wellness examination for Signa.  Hearing/Vision screen Hearing Screening - Comments:: Denies hearing difficulties   Vision Screening - Comments:: Recent Cataract surgery   Goals Addressed   None    Depression Screen     11/25/2023   11:47 AM 10/15/2023    3:19 PM 07/16/2023    3:29 PM 04/15/2023    3:26 PM 02/25/2023    1:32 PM 12/22/2022    2:36 PM 09/09/2022    9:53 AM  PHQ 2/9 Scores  PHQ - 2 Score 0 0 0 0 0 1 0  PHQ- 9 Score 0     3     Fall Risk     11/25/2023   11:43 AM 10/15/2023    3:18 PM 07/16/2023    3:29 PM 04/15/2023    3:26 PM 02/25/2023    1:32 PM  Fall Risk   Falls in the past year? 1 0 0 0 0  Number falls in past yr: 0 0 0 0 0  Injury with Fall? 1 0 0 0 0  Comment nothing broken      Risk for fall due to : Medication side effect;Impaired balance/gait No Fall Risks No  Fall Risks No Fall Risks No Fall Risks  Follow up Falls evaluation completed;Falls prevention discussed Falls evaluation completed Falls evaluation completed Falls evaluation completed Falls evaluation completed    MEDICARE RISK AT HOME:  Medicare Risk at Home Any stairs in or around the home?: No If so, are there any without handrails?: No Home free of loose throw rugs in walkways, pet beds, electrical cords, etc?: Yes Adequate lighting in your home to reduce risk of falls?: Yes Life alert?: No Use of a cane, walker or w/c?: No Grab bars in the bathroom?: Yes Shower chair or bench in shower?: Yes Elevated toilet seat or a handicapped toilet?: Yes  TIMED UP AND GO:  Was the test performed?  No  Cognitive Function: Declined/Normal: No cognitive concerns noted by patient or family. Patient alert, oriented, able to answer questions appropriately and recall recent events. No signs of memory loss or confusion.        12/22/2022    2:38 PM 12/16/2021    2:19 PM  6CIT Screen  What Year? 0 points 0 points  What month? 0 points 0 points  What time? 0 points 0 points  Count back from 20 0 points 0 points  Months  in reverse 0 points 2 points  Repeat phrase 0 points 4 points  Total Score 0 points 6 points    Immunizations Immunization History  Administered Date(s) Administered   HIB (PRP-OMP) 09/04/2020   Meningococcal B, Unspecified 09/04/2020   Pneumococcal Conjugate-13 06/27/2016   Pneumococcal Polysaccharide-23 04/14/2005, 09/17/2017, 09/04/2020   Tdap 04/28/2019    Screening Tests Health Maintenance  Topic Date Due   Diabetic kidney evaluation - Urine ACR  Never done   OPHTHALMOLOGY EXAM  11/04/2023   Medicare Annual Wellness (AWV)  12/22/2023   Diabetic kidney evaluation - eGFR measurement  04/14/2024   HEMOGLOBIN A1C  04/15/2024   FOOT EXAM  11/24/2024   DTaP/Tdap/Td (2 - Td or Tdap) 04/27/2029   Pneumonia Vaccine 69+ Years old  Completed   DEXA SCAN  Completed    Hepatitis C Screening  Completed   HPV VACCINES  Aged Out   Meningococcal B Vaccine  Aged Out   INFLUENZA VACCINE  Discontinued   Colonoscopy  Discontinued   COVID-19 Vaccine  Discontinued   Zoster Vaccines- Shingrix  Discontinued    Health Maintenance  Health Maintenance Due  Topic Date Due   Diabetic kidney evaluation - Urine ACR  Never done   OPHTHALMOLOGY EXAM  11/04/2023   Medicare Annual Wellness (AWV)  12/22/2023   Health Maintenance Items Addressed: Diabetic Foot Exam scheduled, Hepatitis C Screening, UACR (Urine Albumin :Creatinine Ratio), See Nurse Notes  Additional Screening:  Vision Screening: Recommended annual ophthalmology exams for early detection of glaucoma and other disorders of the eye.  Dental Screening: Recommended annual dental exams for proper oral hygiene  Community Resource Referral / Chronic Care Management: CRR required this visit?  No   CCM required this visit?  No   Plan:    I have personally reviewed and noted the following in the patient's chart:   Medical and social history Use of alcohol, tobacco or illicit drugs  Current medications and supplements including opioid prescriptions. Patient is currently taking opioid prescriptions. Information provided to patient regarding non-opioid alternatives. Patient advised to discuss non-opioid treatment plan with their provider. Functional ability and status Nutritional status Physical activity Advanced directives List of other physicians Hospitalizations, surgeries, and ER visits in previous 12 months Vitals Screenings to include cognitive, depression, and falls Referrals and appointments  In addition, I have reviewed and discussed with patient certain preventive protocols, quality metrics, and best practice recommendations. A written personalized care plan for preventive services as well as general preventive health recommendations were provided to patient.   Olufemi Mofield L Jaunita Mikels,  CMA   11/25/2023   After Visit Summary: (MyChart) Due to this being a telephonic visit, the after visit summary with patients personalized plan was offered to patient via MyChart   Notes: Please refer to Routing Comments.

## 2023-12-14 ENCOUNTER — Encounter: Payer: Self-pay | Admitting: Internal Medicine

## 2023-12-14 ENCOUNTER — Ambulatory Visit (INDEPENDENT_AMBULATORY_CARE_PROVIDER_SITE_OTHER): Admitting: Internal Medicine

## 2023-12-14 VITALS — BP 118/78 | HR 67 | Temp 98.0°F | Ht 62.0 in | Wt 171.0 lb

## 2023-12-14 DIAGNOSIS — I2583 Coronary atherosclerosis due to lipid rich plaque: Secondary | ICD-10-CM

## 2023-12-14 DIAGNOSIS — E669 Obesity, unspecified: Secondary | ICD-10-CM

## 2023-12-14 DIAGNOSIS — H6123 Impacted cerumen, bilateral: Secondary | ICD-10-CM

## 2023-12-14 DIAGNOSIS — E785 Hyperlipidemia, unspecified: Secondary | ICD-10-CM | POA: Diagnosis not present

## 2023-12-14 DIAGNOSIS — Z8507 Personal history of malignant neoplasm of pancreas: Secondary | ICD-10-CM | POA: Insufficient documentation

## 2023-12-14 DIAGNOSIS — M5440 Lumbago with sciatica, unspecified side: Secondary | ICD-10-CM | POA: Diagnosis not present

## 2023-12-14 DIAGNOSIS — E1169 Type 2 diabetes mellitus with other specified complication: Secondary | ICD-10-CM

## 2023-12-14 DIAGNOSIS — G8929 Other chronic pain: Secondary | ICD-10-CM

## 2023-12-14 DIAGNOSIS — F411 Generalized anxiety disorder: Secondary | ICD-10-CM

## 2023-12-14 LAB — COMPREHENSIVE METABOLIC PANEL WITH GFR
ALT: 10 U/L (ref 0–35)
AST: 19 U/L (ref 0–37)
Albumin: 4.2 g/dL (ref 3.5–5.2)
Alkaline Phosphatase: 101 U/L (ref 39–117)
BUN: 15 mg/dL (ref 6–23)
CO2: 29 meq/L (ref 19–32)
Calcium: 9.2 mg/dL (ref 8.4–10.5)
Chloride: 105 meq/L (ref 96–112)
Creatinine, Ser: 0.63 mg/dL (ref 0.40–1.20)
GFR: 86.65 mL/min (ref >60.00)
Glucose, Bld: 137 mg/dL — ABNORMAL HIGH (ref 70–99)
Potassium: 4.2 meq/L (ref 3.5–5.1)
Sodium: 140 meq/L (ref 135–145)
Total Bilirubin: 0.5 mg/dL (ref 0.2–1.2)
Total Protein: 6.9 g/dL (ref 6.0–8.3)

## 2023-12-14 LAB — LIPID PANEL
Cholesterol: 188 mg/dL (ref 0–200)
HDL: 62.2 mg/dL (ref >39.00)
LDL Cholesterol: 108 mg/dL — ABNORMAL HIGH (ref 0–99)
NonHDL: 125.5
Total CHOL/HDL Ratio: 3
Triglycerides: 87 mg/dL (ref 0.0–149.0)
VLDL: 17.4 mg/dL (ref 0.0–40.0)

## 2023-12-14 LAB — MICROALBUMIN / CREATININE URINE RATIO
Creatinine,U: 142.1 mg/dL
Microalb Creat Ratio: 14.2 mg/g (ref 0.0–30.0)
Microalb, Ur: 2 mg/dL — ABNORMAL HIGH (ref 0.0–1.9)

## 2023-12-14 LAB — HEMOGLOBIN A1C: Hgb A1c MFr Bld: 7 % — ABNORMAL HIGH (ref 4.6–6.5)

## 2023-12-14 MED ORDER — HYDROCODONE-ACETAMINOPHEN 7.5-325 MG PO TABS: 1.0000 | ORAL_TABLET | Freq: Three times a day (TID) | ORAL | 0 refills | Status: DC | PRN

## 2023-12-14 MED ORDER — ALPRAZOLAM 0.5 MG PO TABS: 0.5000 mg | ORAL_TABLET | Freq: Two times a day (BID) | ORAL | 2 refills | Status: DC | PRN

## 2023-12-14 NOTE — Assessment & Plan Note (Signed)
 Will irrigate

## 2023-12-14 NOTE — Assessment & Plan Note (Signed)
 2021 Cor CT morphology: 1. Coronary calcium  score of 8. This was 27 percentile for age and sex matched control.   2. Normal coronary origin with right dominance.   3.  Mild RCA calcified plaque, 0-24% stenosis.   4. CAD-RADS 1. Minimal non-obstructive CAD (0-24%). Consider non-atherosclerotic causes of chest pain. Consider preventive therapy and risk factor modification.  Off Crestor  - pt stopped Check Lipids

## 2023-12-14 NOTE — Assessment & Plan Note (Signed)
Chronic Cont on PRN Xanax  Potential benefits of a long term benzodiazepines  use as well as potential risks  and complications were explained to the patient and were aknowledged.

## 2023-12-14 NOTE — Progress Notes (Signed)
 Subjective:  Patient ID: Amanda Arnold, female    DOB: [birth year]  Age: 78 y.o. MRN: [id number]  CC: Medical Management of Chronic Issues (Pt is wanting labs done to check A1c, Cholesterol and have both ears checked)   HPI Amanda Arnold presents for LBP, anxiety, dyslipidemia, B12 def Not taking Crestor   Outpatient Medications Prior to Visit  Medication Sig Dispense Refill   Cholecalciferol (VITAMIN D3) 50 MCG (2000 UT) capsule Take 1 capsule (2,000 Units total) by mouth daily. 100 capsule 3   Cyanocobalamin  (VITAMIN B-12) 1000 MCG SUBL Place 1 tablet (1,000 mcg total) under the tongue daily. 100 tablet 3   ibuprofen  (ADVIL ) 200 MG tablet Take 200 mg by mouth every 6 (six) hours as needed for mild pain or moderate pain.     ketoconazole  (NIZORAL ) 2 % shampoo Apply topically.     rosuvastatin  (CRESTOR ) 10 MG tablet TAKE 1 TABLET(10 MG) BY MOUTH DAILY 90 tablet 3   ALPRAZolam  (XANAX ) 0.5 MG tablet Take 1 tablet (0.5 mg total) by mouth 2 (two) times daily as needed for anxiety. 60 tablet 2   HYDROcodone -acetaminophen  (NORCO) 7.5-325 MG tablet Take 1 tablet by mouth every 8 (eight) hours as needed for severe pain (pain score 7-10). 60 tablet 0   No facility-administered medications prior to visit.    ROS: Review of Systems  Constitutional:  Negative for activity change, appetite change, chills, fatigue and unexpected weight change.  HENT:  Positive for hearing loss. Negative for congestion, mouth sores and sinus pressure.   Eyes:  Negative for visual disturbance.  Respiratory:  Negative for cough and chest tightness.   Gastrointestinal:  Negative for abdominal pain and nausea.  Genitourinary:  Negative for difficulty urinating, frequency and vaginal pain.  Musculoskeletal:  Positive for back pain. Negative for gait problem.  Skin:  Negative for pallor and rash.  Neurological:  Negative for dizziness, tremors, weakness, numbness and headaches.  Psychiatric/Behavioral:  Negative  for confusion, sleep disturbance and suicidal ideas.     Objective:  BP 118/78   Pulse 67   Temp 98 F (36.7 C) (Oral)   Ht 5\' 2"  (1.575 m)   Wt 171 lb (77.6 kg)   LMP  (LMP Unknown)   SpO2 96%   BMI 31.28 kg/m   BP Readings from Last 3 Encounters:  12/14/23 118/78  10/15/23 (!) 140/72  10/01/23 (!) 140/59    Wt Readings from Last 3 Encounters:  12/14/23 171 lb (77.6 kg)  11/25/23 171 lb (77.6 kg)  10/15/23 171 lb 6.4 oz (77.7 kg)    Physical Exam Constitutional:      General: She is not in acute distress.    Appearance: She is well-developed. She is obese.  HENT:     Head: Normocephalic.     Right Ear: External ear normal. There is impacted cerumen.     Left Ear: External ear normal. There is impacted cerumen.     Nose: Nose normal.  Eyes:     General:        Right eye: No discharge.        Left eye: No discharge.     Conjunctiva/sclera: Conjunctivae normal.     Pupils: Pupils are equal, round, and reactive to light.  Neck:     Thyroid : No thyromegaly.     Vascular: No JVD.     Trachea: No tracheal deviation.  Cardiovascular:     Rate and Rhythm: Normal rate and regular rhythm.  Heart sounds: Normal heart sounds.  Pulmonary:     Effort: No respiratory distress.     Breath sounds: No stridor. No wheezing.  Abdominal:     General: Bowel sounds are normal. There is no distension.     Palpations: Abdomen is soft. There is no mass.     Tenderness: There is no abdominal tenderness. There is no guarding or rebound.  Musculoskeletal:        General: No tenderness.     Cervical back: Normal range of motion and neck supple. No rigidity.  Lymphadenopathy:     Cervical: No cervical adenopathy.  Skin:    Findings: No erythema or rash.  Neurological:     Cranial Nerves: No cranial nerve deficit.     Motor: No abnormal muscle tone.     Coordination: Coordination normal.     Deep Tendon Reflexes: Reflexes normal.  Psychiatric:        Behavior: Behavior normal.         Thought Content: Thought content normal.        Judgment: Judgment normal.   LS spine w/pain B wax   Procedure Note :     Procedure :  Ear irrigation right and left ears   Indication:  Cerumen impaction right and left ears   Risks, including pain, dizziness, eardrum perforation, bleeding, infection and others as well as benefits were explained to the patient in detail. Verbal consent was obtained and the patient agreed to proceed.    We used "The Elephant Ear Irrigation Device" filled with lukewarm water for irrigation. A large amount wax was recovered from both ears. Procedure has also required manual wax removal/instrumentation with an ear wax curette and ear forceps on the right and left ears.   Tolerated well. Complications: None.   Postprocedure instructions :  Call if problems.  Lab Results  Component Value Date   WBC 10.9 (H) 04/15/2023   HGB 13.8 04/15/2023   HCT 42.6 04/15/2023   PLT 277.0 04/15/2023   GLUCOSE 96 04/15/2023   CHOL 167 09/17/2017   TRIG 92.0 09/17/2017   HDL 53.00 09/17/2017   LDLDIRECT 56 06/25/2021   LDLCALC 96 09/17/2017   ALT 11 04/15/2023   AST 22 04/15/2023   NA 138 04/15/2023   K 4.7 04/15/2023   CL 103 04/15/2023   CREATININE 0.65 04/15/2023   BUN 18 04/15/2023   CO2 28 04/15/2023   TSH 1.43 09/09/2022   INR 1.1 (H) 08/22/2020   HGBA1C 6.7 (A) 10/15/2023    DG Ribs Unilateral W/Chest Left Result Date: 10/01/2023 CLINICAL DATA:  Left chest wall pain after fall. EXAM: LEFT RIBS AND CHEST - 3+ VIEW COMPARISON:  February 18, 2022. FINDINGS: No fracture or other bone lesions are seen involving the ribs. There is no evidence of pneumothorax or pleural effusion. Both lungs are clear. Heart size and mediastinal contours are within normal limits. IMPRESSION: Negative. Electronically Signed   By: Rosalene Colon M.D.   On: 10/01/2023 16:45   DG Knee Complete 4 Views Left Result Date: 10/01/2023 CLINICAL DATA:  Left knee pain after fall.  EXAM: LEFT KNEE - COMPLETE 4+ VIEW COMPARISON:  None Available. FINDINGS: No evidence of fracture, dislocation, or joint effusion. No evidence of arthropathy or other focal bone abnormality. Soft tissues are unremarkable. IMPRESSION: Negative. Electronically Signed   By: Rosalene Colon M.D.   On: 10/01/2023 16:39    Assessment & Plan:   Problem List Items Addressed This Visit  Dyslipidemia   2021 Cor CT morphology: 1. Coronary calcium  score of 8. This was 8 percentile for age and sex matched control.   2. Normal coronary origin with right dominance.   3.  Mild RCA calcified plaque, 0-24% stenosis.   4. CAD-RADS 1. Minimal non-obstructive CAD (0-24%). Consider non-atherosclerotic causes of chest pain. Consider preventive therapy and risk factor modification.  Off Crestor  - pt stopped Check Lipids      Relevant Orders   Comprehensive metabolic panel with GFR   Lipid panel   Generalized anxiety disorder - Primary   Chronic Cont on PRN Xanax   Potential benefits of a long term benzodiazepines  use as well as potential risks  and complications were explained to the patient and were aknowledged.      Relevant Medications   ALPRAZolam  (XANAX ) 0.5 MG tablet   LOW BACK PAIN   Chronic MSK/OA.-- spinal stenosis  Norco prn  Potential benefits of a long term opioids use as well as potential risks (i.e. addiction risk, apnea etc) and complications (i.e. Somnolence, constipation and others) were explained to the patient and were aknowledged.  Potential benefits of a long term NSAIDs  use as well as potential risks  and complications were explained to the patient and were aknowledged.  Handicapped decal       Relevant Medications   HYDROcodone -acetaminophen  (NORCO) 7.5-325 MG tablet   Type 2 diabetes mellitus with obesity (HCC)   Check A1c      Relevant Orders   Comprehensive metabolic panel with GFR   Hemoglobin A1c   Lipid panel   Microalbumin / creatinine urine ratio    Cerumen impaction   Will irrigate      Coronary atherosclerosis   2021 Cor CT morphology: 1. Coronary calcium  score of 8. This was 59 percentile for age and sex matched control.   2. Normal coronary origin with right dominance.   3.  Mild RCA calcified plaque, 0-24% stenosis.   4. CAD-RADS 1. Minimal non-obstructive CAD (0-24%). Consider non-atherosclerotic causes of chest pain. Consider preventive therapy and risk factor modification.  Off Crestor  - pt stopped Check Lipids      Relevant Orders   Lipid panel   History of pancreatic cancer   F/u w/Surgery and Oncology Dr Cherlynn Cornfield Dr Scherrie Curt         Meds ordered this encounter  Medications   HYDROcodone -acetaminophen  (NORCO) 7.5-325 MG tablet    Sig: Take 1 tablet by mouth every 8 (eight) hours as needed for severe pain (pain score 7-10).    Dispense:  60 tablet    Refill:  0    M54.30   ALPRAZolam  (XANAX ) 0.5 MG tablet    Sig: Take 1 tablet (0.5 mg total) by mouth 2 (two) times daily as needed for anxiety.    Dispense:  60 tablet    Refill:  2      Follow-up: Return in about 3 months (around 03/15/2024) for a follow-up visit.  Anitra Barn, MD

## 2023-12-14 NOTE — Assessment & Plan Note (Signed)
 Check A1c.

## 2023-12-14 NOTE — Assessment & Plan Note (Signed)
 Chronic MSK/OA.-- spinal stenosis  Norco prn  Potential benefits of a long term opioids use as well as potential risks (i.e. addiction risk, apnea etc) and complications (i.e. Somnolence, constipation and others) were explained to the patient and were aknowledged.  Potential benefits of a long term NSAIDs  use as well as potential risks  and complications were explained to the patient and were aknowledged.  Handicapped decal

## 2023-12-14 NOTE — Assessment & Plan Note (Signed)
 F/u w/Surgery and Oncology Dr Cherlynn Cornfield Dr Scherrie Curt

## 2023-12-20 ENCOUNTER — Ambulatory Visit: Payer: Self-pay | Admitting: Internal Medicine

## 2023-12-31 ENCOUNTER — Ambulatory Visit: Attending: Cardiology | Admitting: Cardiology

## 2023-12-31 ENCOUNTER — Encounter: Payer: Self-pay | Admitting: Cardiology

## 2023-12-31 VITALS — BP 124/64 | HR 79 | Ht 62.0 in | Wt 168.8 lb

## 2023-12-31 DIAGNOSIS — E785 Hyperlipidemia, unspecified: Secondary | ICD-10-CM | POA: Diagnosis not present

## 2023-12-31 DIAGNOSIS — I2510 Atherosclerotic heart disease of native coronary artery without angina pectoris: Secondary | ICD-10-CM | POA: Diagnosis not present

## 2023-12-31 DIAGNOSIS — I10 Essential (primary) hypertension: Secondary | ICD-10-CM

## 2023-12-31 MED ORDER — PRAVASTATIN SODIUM 10 MG PO TABS: 10.0000 mg | ORAL_TABLET | Freq: Every evening | ORAL | 3 refills | Status: DC

## 2023-12-31 NOTE — Progress Notes (Unsigned)
 Cardiology Office Note:  .   Date:  01/01/2024  ID:  JAHNA LIEBERT, DOB 1945/10/17, MRN [id number] PCP: Genia Kettering, MD  [facility] Providers Cardiologist:  Dorothye Gathers, MD    History of Present Illness: .   KENYIA WAMBOLT is a 78 y.o. female Discussed the use of AI scribe software for clinical note transcription with the patient, who gave verbal consent to proceed.  History of Present Illness ANNJANETTE WERTENBERGER is a 78 year old female with coronary artery calcification who presents for follow-up.  She is here for follow-up on coronary artery calcification noted on a CT scan in 2021, which showed calcified plaque in the right coronary artery, nonobstructive. Her prior EKG and echocardiogram showed an ejection fraction of 63%, and premature atrial contractions were noted on the monitor.  She has stopped taking her cholesterol medication, Crestor , due to side effects including difficulty walking and shoulder pain. After a brief period of resuming the medication, she discontinued it again due to similar symptoms. She is currently not taking any cholesterol medication and is focusing on dietary management.  She has a history of a lung nodule, which was last evaluated in January 2025 and was unchanged from prior imaging. This nodule was initially followed due to her history of pancreatic cancer. She is scheduled for a follow-up regarding her cancer next month.      Studies Reviewed: Aaron Aas   EKG Interpretation Date/Time:  Thursday December 31 2023 16:00:41 EDT Ventricular Rate:  79 PR Interval:  172 QRS Duration:  74 QT Interval:  376 QTC Calculation: 431 R Axis:   -18  Text Interpretation: Normal sinus rhythm Normal ECG When compared with ECG of 25-Jul-2020 16:36, Nonspecific T wave abnormality has replaced inverted T waves in Anterior leads Confirmed by Dorothye Gathers ([id number]) on 12/31/2023 4:26:41 PM    Results RADIOLOGY Coronary CT: Calcified plaque in RCA, nonobstructive  (2021) Lung CT: 4 mm nodule unchanged, considered benign (07/2023)  DIAGNOSTIC EKG: Stable Echocardiogram: EF 63% Risk Assessment/Calculations:             Physical Exam:   VS:  BP 124/64 (BP Location: Left Arm, Patient Position: Sitting, Cuff Size: Normal)   Pulse 79   Ht 5' 2 (1.575 m)   Wt 168 lb 12.8 oz (76.6 kg)   LMP  (LMP Unknown)   SpO2 97%   BMI 30.87 kg/m    Wt Readings from Last 3 Encounters:  12/31/23 168 lb 12.8 oz (76.6 kg)  12/14/23 171 lb (77.6 kg)  11/25/23 171 lb (77.6 kg)    GEN: Well nourished, well developed in no acute distress NECK: No JVD; No carotid bruits CARDIAC: RRR, no murmurs, no rubs, no gallops RESPIRATORY:  Clear to auscultation without rales, wheezing or rhonchi  ABDOMEN: Soft, non-tender, non-distended EXTREMITIES:  No edema; No deformity   ASSESSMENT AND PLAN: .    Assessment and Plan Assessment & Plan Coronary artery calcification/ CAD Coronary artery calcification identified on CT scan with coronary CT in 2021 showing calcified plaque in the RCA, nonobstructive. The calcification is well-managed with GDMT to help prevent progression and myocardial infarction. Statins are recommended to stabilize plaque and help prevent myocardial infarction. - Encourage adherence to statin therapy to stabilize plaque and prevent myocardial infarction.  Hyperlipidemia Hyperlipidemia management complicated by intolerance to rosuvastatin , which caused musculoskeletal discomfort. Alternative statin therapy with pravastatin is considered due to its potentially better tolerance profile. If pravastatin is not tolerated, non-statin alternatives will be  considered. - Initiate pravastatin 10 mg daily. - Instruct to discontinue pravastatin if adverse effects occur, wait two weeks, and then retry. - If pravastatin is not tolerated, consider non-statin alternatives. - Schedule follow-up with an APP in six months to evaluate hyperlipidemia management.  Pancreatic  cancer Pancreatic cancer with recent follow-up CT in January 2025 showing a well-managed 4 mm lung nodule, considered benign. Continued monitoring by oncology is planned. - Continue follow-up with oncology for pancreatic cancer surveillance.           Signed, Dorothye Gathers, MD

## 2023-12-31 NOTE — Patient Instructions (Addendum)
 Medication Instructions:  DISCONTINUE Crestor   START Pravastatin 10 mg daily - notify the office if muscle pain occurs *If you need a refill on your cardiac medications before your next appointment, please call your pharmacy*  Follow-Up: At [facility], you and your health needs are our priority.  As part of our continuing mission to provide you with exceptional heart care, our providers are all part of one team.  This team includes your primary Cardiologist (physician) and Advanced Practice Providers or APPs (Physician Assistants and Nurse Practitioners) who all work together to provide you with the care you need, when you need it.  Your next appointment:   6 month(s)  Provider:   One of our Advanced Practice Providers (APPs): Melita Springer, PA-C  Friddie Jetty, NP Evaline Hill, NP  Theotis Flake, PA-C Lawana Pray, NP  Willis Harter, PA-C Lovette Rud, PA-C  [place], PA-C Ernest Dick, NP  Marlana Silvan, NP Marcie Sever, PA-C  Laquita Plant, PA-C    Dayna Dunn, PA-C  Scott Weaver, PA-C Palmer Bobo, NP Katlyn West, NP Callie Goodrich, PA-C  Evan Williams, PA-C Sheng Haley, PA-C  Xika Zhao, NP Kathleen Johnson, PA-C    We recommend signing up for the patient portal called MyChart.  Sign up information is provided on this After Visit Summary.  MyChart is used to connect with patients for Virtual Visits (Telemedicine).  Patients are able to view lab/test results, encounter notes, upcoming appointments, etc.  Non-urgent messages can be sent to your provider as well.   To learn more about what you can do with MyChart, go to [email or web address].

## 2024-01-07 ENCOUNTER — Telehealth: Payer: Self-pay | Admitting: Cardiology

## 2024-01-07 NOTE — Telephone Encounter (Signed)
 Pt states she is having muscle pains in her shoulders and hands after starting pravastatin . Stopped taking a few days ago. Feels better since stopping.  She also stated after taking the medication she felt heart palpations at night. Advised pt I would send over to Dr Jeffrie for review and we would get back to her about other options she may have. Pt stated understanding.

## 2024-01-07 NOTE — Telephone Encounter (Signed)
 Pt c/o medication issue:  1. Name of Medication: pravastatin  (PRAVACHOL ) 10 MG tablet   2. How are you currently taking this medication (dosage and times per day)? Stopped taking 2 days ago  3. Are you having a reaction (difficulty breathing--STAT)? -  4. What is your medication issue? Pain in Rt arm and legs, causing palps

## 2024-01-14 NOTE — Telephone Encounter (Signed)
 Left message for pt to call back to discuss symptoms and Dr Jeffrie order to start zetia 10 mg a day.  Jeffrie Oneil BROCKS, MD to Radames Corean SAILOR, RN  Theotis Sharlet PARAS, RN (Selected Message)    01/07/24 12:21 PM Thanks for the update.  Myalgias on pravastatin  as well. Lets try Zetia 10 mg monotherapy. Oneil Jeffrie, MD

## 2024-01-19 ENCOUNTER — Other Ambulatory Visit: Payer: Medicare Other

## 2024-01-19 ENCOUNTER — Inpatient Hospital Stay: Payer: Medicare Other | Attending: Oncology | Admitting: Oncology

## 2024-01-19 VITALS — BP 120/67 | HR 77 | Temp 98.1°F | Resp 18 | Ht 62.0 in | Wt 167.1 lb

## 2024-01-19 DIAGNOSIS — C252 Malignant neoplasm of tail of pancreas: Secondary | ICD-10-CM | POA: Diagnosis not present

## 2024-01-19 DIAGNOSIS — Z87891 Personal history of nicotine dependence: Secondary | ICD-10-CM | POA: Insufficient documentation

## 2024-01-19 DIAGNOSIS — Z8507 Personal history of malignant neoplasm of pancreas: Secondary | ICD-10-CM | POA: Insufficient documentation

## 2024-01-19 DIAGNOSIS — I10 Essential (primary) hypertension: Secondary | ICD-10-CM | POA: Insufficient documentation

## 2024-01-19 DIAGNOSIS — J449 Chronic obstructive pulmonary disease, unspecified: Secondary | ICD-10-CM | POA: Insufficient documentation

## 2024-01-19 NOTE — Progress Notes (Signed)
 Amanda Arnold OFFICE PROGRESS NOTE   Diagnosis: Pancreas cancer  INTERVAL HISTORY:   [name] returns as scheduled.  She generally feels well.  Good appetite.  She reports chronic discomfort at the right lower lateral abdomen, back, and legs with a longstanding and position change.  No new complaint.  She discontinued smoking when she was diagnosed with pancreas cancer.  Objective:  Vital signs in last 24 hours:  Blood pressure 120/67, pulse 77, temperature 98.1 F (36.7 C), temperature source Temporal, resp. rate 18, height 5' 2 (1.575 m), weight 167 lb 1.6 oz (75.8 kg), SpO2 98%.  Lymphatics: No cervical, supraclavicular, axillary, or inguinal nodes Resp: Clear bilaterally Cardio: Regular rate and rhythm GI: No hepatosplenomegaly, nontender, no mass, no apparent ascites Vascular: No leg edema Skin: Sebaceous cyst at the left upper back   Lab Results:  Lab Results  Component Value Date   WBC 10.9 (H) 04/15/2023   HGB 13.8 04/15/2023   HCT 42.6 04/15/2023   MCV 97.2 04/15/2023   PLT 277.0 04/15/2023   NEUTROABS 4.9 04/15/2023    CMP  Lab Results  Component Value Date   NA 140 12/14/2023   K 4.2 12/14/2023   CL 105 12/14/2023   CO2 29 12/14/2023   GLUCOSE 137 (H) 12/14/2023   BUN 15 12/14/2023   CREATININE 0.63 12/14/2023   CALCIUM  9.2 12/14/2023   PROT 6.9 12/14/2023   ALBUMIN  4.2 12/14/2023   AST 19 12/14/2023   ALT 10 12/14/2023   ALKPHOS 101 12/14/2023   BILITOT 0.5 12/14/2023   GFRNONAA >60 05/22/2021   GFRAA 103 04/20/2020    Lab Results  Component Value Date   RJW800 10 01/23/2023    Medications: I have reviewed the patient's current medications.   Assessment/Plan: Pancreas cancer-stage IIb (pT2pN1) CT abdomen/pelvis 06/01/2020-2.9 x 3 cm pancreas tail mass, slight increase in size of 7 mm right middle lobe nodule MRI/MRCP 06/22/2020-solid 3.8 x 3 x 3.1 cm pancreas tail mass with focal occlusion of the splenic vein and dilated  venous collaterals, no evidence of metastatic disease, bilateral adrenal adenomas CT renal stone study 08/02/2020-scattered airspace opacities throughout the lung bases-new favored to represent infectious/inflammatory changes EUS 08/16/2020-pancreas tail mass, T2N0, FNA biopsy-suspicious for malignancy CTs 08/27/2020-unchanged right middle lobe nodule, superficial left upper back lesion, no change in pancreas tail mass, occluded splenic vein with collaterals at the splenic hilum, no enlarged abdominal pelvic lymph nodes Distal pancreatectomy/splenectomy 10/17/2020- 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  2/15 lymph nodes positive, positive lymphovascular and perineural invasion, resection margins negative, pT2pN1 Cycle 1 gemcitabine /capecitabine  11/21/2020, day 15 gemcitabine  12/05/2020 Cycle 2 gemcitabine /capecitabine  12/20/2020, day 15 gemcitabine  01/03/2021 Cycle 3 gemcitabine /capecitabine  01/16/2021, day 15 gemcitabine  01/30/2021 Cycle 4 gemcitabine /capecitabine  02/13/2021, day 15 gemcitabine  02/27/2021 Cycle 5 gemcitabine /capecitabine  03/13/2021 (capecitabine  dose reduced secondary to hand/foot syndrome), day 15 gemcitabine  03/27/2021 Cycle 6 gemcitabine /capecitabine  04/10/2021, day 15 gemcitabine  04/24/2021 CT abdomen/pelvis 07/26/2021-slight increase in size of a cystic lesion at the distal pancreatic tail/stump.  Additional fluid collections at the surgical bed decreased in size. MRI abdomen 02/12/2022-unchanged nonenhancing fluid signal cyst at the distal pancreatic resection margin without enhancement consistent with a postoperative seroma or pseudocyst, no evidence of metastatic disease   2.   Gastric ulcer on EUS 08/16/2020-H. pylori positive 3.   COVID-19 infection January 2022 4.   COPD 5.   Hypertension 6.   Right abdomen/right leg pain-musculoskeletal? 7.   Right middle lobe nodule on  CT 06/01/2020, increased  from 2017, stable on CT 11/13/2020, stable on CT 04/23/2021, stable on CT 08/05/2023 8.   History of tobacco use 9.   Superficial left upper back lesion noted on CT 08/27/2020      Disposition: Amanda Arnold is in clinical remission from pancreas cancer.  She will return for an office visit in 6 months.  We will schedule her for a yearly screening chest CT given her smoking history.   Arley Hof, MD  01/19/2024  10:47 AM

## 2024-01-19 NOTE — Telephone Encounter (Signed)
 Patient stopped by desk at [facility] today wanting to talk with Dr Jeffrie' nurse re medication issue. Explained that neither Dr Jeffrie nor his nurse are on site at [facility] today so patient would like a call today or tmrw. Patient will be home all afternoon today, 01/19/24, from 1:30 on. Otherwise, tomorrow 01/20/24 in the morning would be good for a call back.

## 2024-01-20 MED ORDER — EZETIMIBE 10 MG PO TABS: 10.0000 mg | ORAL_TABLET | Freq: Every day | ORAL | 3 refills | Status: AC

## 2024-01-20 NOTE — Telephone Encounter (Signed)
 Spoke with patient who reports she discontinue her pravastatin  d/t muscle pain.  It has not improved at this time.  She is agreeable to try zetia  10 mg daily as ordered.  Reviewed possible side effects.  She states her legs hurt if she walks a lot, stands too long or even while sitting on the commode.  Achy pain in the upper part of her legs - above the knee.  No pain below knee with walking, standing or sitting.  She reports she has discussed this with her cancer Dr as well.  Tylenol  seems to help with the discomfort per her report.  RX sent into pharmacy of choice.  She will call back if any further questions or concerns.

## 2024-02-21 ENCOUNTER — Emergency Department (HOSPITAL_BASED_OUTPATIENT_CLINIC_OR_DEPARTMENT_OTHER): Admitting: Radiology

## 2024-02-21 ENCOUNTER — Emergency Department (HOSPITAL_BASED_OUTPATIENT_CLINIC_OR_DEPARTMENT_OTHER): Admission: EM | Admit: 2024-02-21 | Discharge: 2024-02-21 | Disposition: A | Discharge status: Home / Self Care

## 2024-02-21 ENCOUNTER — Other Ambulatory Visit: Payer: Self-pay

## 2024-02-21 ENCOUNTER — Encounter (HOSPITAL_BASED_OUTPATIENT_CLINIC_OR_DEPARTMENT_OTHER): Payer: Self-pay | Admitting: Emergency Medicine

## 2024-02-21 DIAGNOSIS — I517 Cardiomegaly: Secondary | ICD-10-CM | POA: Diagnosis not present

## 2024-02-21 DIAGNOSIS — J449 Chronic obstructive pulmonary disease, unspecified: Secondary | ICD-10-CM | POA: Diagnosis not present

## 2024-02-21 DIAGNOSIS — R197 Diarrhea, unspecified: Secondary | ICD-10-CM | POA: Diagnosis not present

## 2024-02-21 DIAGNOSIS — R0602 Shortness of breath: Secondary | ICD-10-CM | POA: Insufficient documentation

## 2024-02-21 DIAGNOSIS — B349 Viral infection, unspecified: Secondary | ICD-10-CM | POA: Diagnosis not present

## 2024-02-21 DIAGNOSIS — R059 Cough, unspecified: Secondary | ICD-10-CM | POA: Diagnosis not present

## 2024-02-21 LAB — URINALYSIS, ROUTINE W REFLEX MICROSCOPIC
Bilirubin Urine: NEGATIVE
Glucose, UA: NEGATIVE mg/dL
Hgb urine dipstick: NEGATIVE
Ketones, ur: NEGATIVE mg/dL
Nitrite: NEGATIVE
Specific Gravity, Urine: 1.02 (ref 1.005–1.030)
pH: 6 (ref 5.0–8.0)

## 2024-02-21 LAB — CBC WITH DIFFERENTIAL/PLATELET
Abs Immature Granulocytes: 0.01 K/uL (ref 0.00–0.07)
Basophils Absolute: 0.1 K/uL (ref 0.0–0.1)
Basophils Relative: 1 %
Eosinophils Absolute: 0 K/uL (ref 0.0–0.5)
Eosinophils Relative: 0 %
HCT: 41.1 % (ref 36.0–46.0)
Hemoglobin: 13.8 g/dL (ref 12.0–15.0)
Immature Granulocytes: 0 %
Lymphocytes Relative: 29 %
Lymphs Abs: 2.3 K/uL (ref 0.7–4.0)
MCH: 32.2 pg (ref 26.0–34.0)
MCHC: 33.6 g/dL (ref 30.0–36.0)
MCV: 96 fL (ref 80.0–100.0)
Monocytes Absolute: 0.6 K/uL (ref 0.1–1.0)
Monocytes Relative: 8 %
Neutro Abs: 4.9 K/uL (ref 1.7–7.7)
Neutrophils Relative %: 62 %
Platelets: 304 K/uL (ref 150–400)
RBC: 4.28 MIL/uL (ref 3.87–5.11)
RDW: 13.4 % (ref 11.5–15.5)
WBC: 8 K/uL (ref 4.0–10.5)
nRBC: 0 % (ref 0.0–0.2)

## 2024-02-21 LAB — BASIC METABOLIC PANEL WITH GFR
Anion gap: 11 (ref 5–15)
BUN: 12 mg/dL (ref 8–23)
CO2: 24 mmol/L (ref 22–32)
Calcium: 9.4 mg/dL (ref 8.9–10.3)
Chloride: 103 mmol/L (ref 98–111)
Creatinine, Ser: 0.77 mg/dL (ref 0.44–1.00)
GFR, Estimated: >60 mL/min (ref >60)
Glucose, Bld: 155 mg/dL — ABNORMAL HIGH (ref 70–99)
Potassium: 4.2 mmol/L (ref 3.5–5.1)
Sodium: 139 mmol/L (ref 135–145)

## 2024-02-21 LAB — RESP PANEL BY RT-PCR (RSV, FLU A&B, COVID)  RVPGX2
Influenza A by PCR: NEGATIVE
Influenza B by PCR: NEGATIVE
Resp Syncytial Virus by PCR: NEGATIVE
SARS Coronavirus 2 by RT PCR: NEGATIVE

## 2024-02-21 MED ORDER — ONDANSETRON HCL 4 MG PO TABS: 4.0000 mg | ORAL_TABLET | Freq: Three times a day (TID) | ORAL | 0 refills | Status: AC | PRN

## 2024-02-21 NOTE — ED Provider Notes (Signed)
 [place] EMERGENCY DEPARTMENT AT [facility] Provider Note   CSN: [id number] Arrival date & time: 02/21/24  9172     Patient presents with: Cough   Amanda Arnold is a 78 y.o. female.   78 year old female presents for evaluation of multiple complaints.  Family ember at bedside is concerned that patient has a UTI as she had some diarrhea that started today.  Patient states for about a week she has had cough and congestion and sneezing.  States over-the-counter medication helped initially but today she is very fatigued and has no appetite.  Denies any other symptoms or concerns at this time.   Cough Associated symptoms: no chest pain, no chills, no ear pain, no fever, no rash, no shortness of breath and no sore throat        Prior to Admission medications   Medication Sig Start Date End Date Taking? Authorizing Provider  ondansetron  (ZOFRAN ) 4 MG tablet Take 1 tablet (4 mg total) by mouth every 8 (eight) hours as needed for up to 4 days for nausea. 02/21/24 02/25/24 Yes Alzada Brazee L, DO  acetaminophen  (TYLENOL ) 500 MG tablet Take 1,000 mg by mouth every 6 (six) hours as needed.    [provider]  ALPRAZolam  (XANAX ) 0.5 MG tablet Take 1 tablet (0.5 mg total) by mouth 2 (two) times daily as needed for anxiety. 12/14/23   Plotnikov, Aleksei V, MD  Cholecalciferol (VITAMIN D3) 50 MCG (2000 UT) capsule Take 1 capsule (2,000 Units total) by mouth daily. 10/08/21   Plotnikov, Aleksei V, MD  Cyanocobalamin  (VITAMIN B-12) 1000 MCG SUBL Place 1 tablet (1,000 mcg total) under the tongue daily. 10/08/21   Plotnikov, Aleksei V, MD  ezetimibe  (ZETIA ) 10 MG tablet Take 1 tablet (10 mg total) by mouth daily. 01/20/24   Jeffrie Oneil BROCKS, MD  HYDROcodone -acetaminophen  (NORCO) 7.5-325 MG tablet Take 1 tablet by mouth every 8 (eight) hours as needed for severe pain (pain score 7-10). 12/14/23   Plotnikov, Karlynn GAILS, MD    Allergies: Morphine  and codeine , Ceftin  [cefuroxime ], Trazodone hcl,  Bupropion hcl, Ezetimibe , Metformin  and related, Naproxen , and Tramadol hcl    Review of Systems  Constitutional:  Positive for fatigue. Negative for chills and fever.  HENT:  Negative for ear pain and sore throat.   Eyes:  Negative for pain and visual disturbance.  Respiratory:  Positive for cough. Negative for shortness of breath.   Cardiovascular:  Negative for chest pain and palpitations.  Gastrointestinal:  Positive for diarrhea. Negative for abdominal pain and vomiting.  Genitourinary:  Negative for dysuria and hematuria.  Musculoskeletal:  Negative for arthralgias and back pain.  Skin:  Negative for color change and rash.  Neurological:  Negative for seizures and syncope.  All other systems reviewed and are negative.   Updated Vital Signs BP (!) 141/73 (BP Location: Right Arm)   Pulse 86   Temp 98.2 F (36.8 C) (Oral)   Resp 16   LMP  (LMP Unknown)   SpO2 95%   Physical Exam Vitals and nursing note reviewed.  Constitutional:      General: She is not in acute distress.    Appearance: Normal appearance. She is well-developed. She is not ill-appearing.  HENT:     Head: Normocephalic and atraumatic.  Eyes:     Conjunctiva/sclera: Conjunctivae normal.  Cardiovascular:     Rate and Rhythm: Normal rate and regular rhythm.     Pulses: Normal pulses.     Heart sounds: Normal heart sounds.  No murmur heard. Pulmonary:     Effort: Pulmonary effort is normal. No respiratory distress.     Breath sounds: Normal breath sounds. No stridor. No wheezing or rhonchi.  Abdominal:     Palpations: Abdomen is soft.     Tenderness: There is no abdominal tenderness.  Musculoskeletal:        General: No swelling.     Cervical back: Neck supple.  Skin:    General: Skin is warm and dry.     Capillary Refill: Capillary refill takes less than 2 seconds.  Neurological:     Mental Status: She is alert.  Psychiatric:        Mood and Affect: Mood normal.     (all labs ordered are listed,  but only abnormal results are displayed) Labs Reviewed  BASIC METABOLIC PANEL WITH GFR - Abnormal; Notable for the following components:      Result Value   Glucose, Bld 155 (*)    All other components within normal limits  URINALYSIS, ROUTINE W REFLEX MICROSCOPIC - Abnormal; Notable for the following components:   APPearance HAZY (*)    Protein, ur TRACE (*)    Leukocytes,Ua MODERATE (*)    Bacteria, UA MANY (*)    All other components within normal limits  RESP PANEL BY RT-PCR (RSV, FLU A&B, COVID)  RVPGX2  CBC WITH DIFFERENTIAL/PLATELET    EKG: None  Radiology: DG Chest 1 View Result Date: 02/21/2024 EXAM: 1 VIEW XRAY OF THE CHEST 02/21/2024 09:02:25 AM COMPARISON: 2 view chest x-ray 02/18/2022. CLINICAL HISTORY: SOB. FINDINGS: LUNGS AND PLEURA: No focal pulmonary opacity. No pulmonary edema. No pleural effusion. No pneumothorax. Changes of COPD are noted. HEART AND MEDIASTINUM: Cardiomegaly is stable. Atherosclerotic changes are again noted at the aortic arch. BONES AND SOFT TISSUES: No acute osseous abnormality. IMPRESSION: 1. Stable cardiomegaly and atherosclerotic changes at the aortic arch. 2. Changes of COPD. Electronically signed by: Lonni Necessary MD 02/21/2024 09:10 AM EDT RP Workstation: [id number]     Procedures   Medications Ordered in the ED - No data to display                                  Medical Decision Making Cardiac monitor interpretation: Sinus rhythm, no ectopy  Patient here for viral syndrome.  She has had cough and sneezing for about a week and developed diarrhea today.  She appears well with stable vital signs.  Lab workup reviewed by me and unremarkable.  I will give her prescription for Zofran  to use as needed for nausea and she is advised Imodium as needed for diarrhea.  Advise any other over-the-counter medications as needed for her symptoms.  Advise close up with primary care doctor and otherwise return to the ER for new or worsening  symptoms.  Patient feels comfortable to plan to be discharged home.  Problems Addressed: Diarrhea, unspecified type: acute illness or injury Viral syndrome: acute illness or injury  Amount and/or Complexity of Data Reviewed Independent Historian: caregiver    Details: Daughter at bedside helps provide history that patient has history of frequent UTIs that started diarrhea today and had no appetite External Data Reviewed: notes.    Details: Prior ED records reviewed and patient last seen in March 2025 for fall Labs: ordered. Decision-making details documented in ED Course.    Details: Ordered and reviewed by me and unremarkable, CBC is negative and patient is negative for  COVID flu and RSV and there is no evidence of UTI Radiology: ordered and independent interpretation performed. Decision-making details documented in ED Course.    Details: Ordered and interpreted by me independently of radiology Chest x-ray: Shows no acute abnormality in the chest and no evidence of pneumonia ECG/medicine tests: ordered and independent interpretation performed. Decision-making details documented in ED Course.    Details: Ordered and interpreted by me in the absence of cardiology and shows sinus rhythm, no STEMI and no acute change when compared to prior  Risk OTC drugs. Prescription drug management.     Final diagnoses:  Diarrhea, unspecified type  Viral syndrome    ED Discharge Orders          Ordered    ondansetron  (ZOFRAN ) 4 MG tablet  Every 8 hours PRN        02/21/24 1032               Savior Himebaugh L, DO 02/21/24 1447

## 2024-02-21 NOTE — Discharge Instructions (Signed)
 You can take immodium as needed for diarrhea. It's over the counter and you can pick it up at the pharmacy. You can take zofran  as needed for nausea and vomiting. You can use any other over the counter medications as needed for your symptoms. Follow up with your primary care doctor as needed.

## 2024-02-21 NOTE — ED Triage Notes (Signed)
 C/o cough, sneezing, and congestion x 1 week. States worse at night. Denies CP or SHOB.

## 2024-02-25 ENCOUNTER — Encounter: Payer: Self-pay | Admitting: Internal Medicine

## 2024-02-25 ENCOUNTER — Ambulatory Visit (INDEPENDENT_AMBULATORY_CARE_PROVIDER_SITE_OTHER): Admitting: Internal Medicine

## 2024-02-25 VITALS — BP 119/71 | HR 71 | Temp 98.4°F | Ht 62.0 in | Wt 168.0 lb

## 2024-02-25 DIAGNOSIS — J069 Acute upper respiratory infection, unspecified: Secondary | ICD-10-CM | POA: Diagnosis not present

## 2024-02-25 DIAGNOSIS — F419 Anxiety disorder, unspecified: Secondary | ICD-10-CM

## 2024-02-25 DIAGNOSIS — E669 Obesity, unspecified: Secondary | ICD-10-CM | POA: Diagnosis not present

## 2024-02-25 DIAGNOSIS — E1169 Type 2 diabetes mellitus with other specified complication: Secondary | ICD-10-CM

## 2024-02-25 DIAGNOSIS — N3090 Cystitis, unspecified without hematuria: Secondary | ICD-10-CM

## 2024-02-25 DIAGNOSIS — F32A Depression, unspecified: Secondary | ICD-10-CM

## 2024-02-25 LAB — COMPREHENSIVE METABOLIC PANEL WITH GFR
ALT: 12 U/L (ref 0–35)
AST: 19 U/L (ref 0–37)
Albumin: 4.1 g/dL (ref 3.5–5.2)
Alkaline Phosphatase: 79 U/L (ref 39–117)
BUN: 14 mg/dL (ref 6–23)
CO2: 30 meq/L (ref 19–32)
Calcium: 9 mg/dL (ref 8.4–10.5)
Chloride: 103 meq/L (ref 96–112)
Creatinine, Ser: 0.6 mg/dL (ref 0.40–1.20)
GFR: 87.55 mL/min (ref >60.00)
Glucose, Bld: 109 mg/dL — ABNORMAL HIGH (ref 70–99)
Potassium: 4.1 meq/L (ref 3.5–5.1)
Sodium: 139 meq/L (ref 135–145)
Total Bilirubin: 0.4 mg/dL (ref 0.2–1.2)
Total Protein: 6.9 g/dL (ref 6.0–8.3)

## 2024-02-25 LAB — HEMOGLOBIN A1C: Hgb A1c MFr Bld: 7 % — ABNORMAL HIGH (ref 4.6–6.5)

## 2024-02-25 MED ORDER — CEFDINIR 300 MG PO CAPS: 300.0000 mg | ORAL_CAPSULE | Freq: Two times a day (BID) | ORAL | 0 refills | Status: DC

## 2024-02-25 NOTE — Assessment & Plan Note (Signed)
 Check A1c Not taking Prandin 

## 2024-02-25 NOTE — Assessment & Plan Note (Addendum)
 Probable - omnicef  obtain urinalysis.

## 2024-02-25 NOTE — Assessment & Plan Note (Signed)
New Omnicef po

## 2024-02-25 NOTE — Progress Notes (Signed)
 Subjective:  Patient ID: Amanda Arnold, female    DOB: December 18, 1945  Age: 78 y.o. MRN: [id number]  CC: Medical Management of Chronic Issues (Discuss high sugar levels and recent hospital visit )   HPI Amanda Arnold presents for cough, runny cough x 7 d Diarrhea Pt went to ER on 02/21/24 ?UTI   Outpatient Medications Prior to Visit  Medication Sig Dispense Refill   acetaminophen (TYLENOL) 500 MG tablet Take 1,000 mg by mouth every 6 (six) hours as needed.     ALPRAZolam (XANAX) 0.5 MG tablet Take 1 tablet (0.5 mg total) by mouth 2 (two) times daily as needed for anxiety. 60 tablet 2   Cholecalciferol (VITAMIN D3) 50 MCG (2000 UT) capsule Take 1 capsule (2,000 Units total) by mouth daily. 100 capsule 3   Cyanocobalamin (VITAMIN B-12) 1000 MCG SUBL Place 1 tablet (1,000 mcg total) under the tongue daily. 100 tablet 3   ezetimibe (ZETIA) 10 MG tablet Take 1 tablet (10 mg total) by mouth daily. 90 tablet 3   HYDROcodone-acetaminophen (NORCO) 7.5-325 MG tablet Take 1 tablet by mouth every 8 (eight) hours as needed for severe pain (pain score 7-10). 60 tablet 0   ondansetron (ZOFRAN) 4 MG tablet Take 1 tablet (4 mg total) by mouth every 8 (eight) hours as needed for up to 4 days for nausea. 12 tablet 0   No facility-administered medications prior to visit.    ROS: Review of Systems  Constitutional:  Positive for fatigue. Negative for activity change, appetite change, chills and unexpected weight change.  HENT:  Positive for congestion. Negative for mouth sores and sinus pressure.   Eyes:  Negative for visual disturbance.  Respiratory:  Negative for cough and chest tightness.   Gastrointestinal:  Negative for abdominal pain and nausea.  Genitourinary:  Positive for frequency. Negative for difficulty urinating and vaginal pain.  Musculoskeletal:  Negative for back pain and gait problem.  Skin:  Negative for pallor and rash.  Neurological:  Negative for dizziness, tremors, weakness,  numbness and headaches.  Hematological:  Bruises/bleeds easily.  Psychiatric/Behavioral:  Negative for confusion and sleep disturbance.     Objective:  BP 119/71   Pulse 71   Temp 98.4 F (36.9 C) (Oral)   Ht 5' 2 (1.575 m)   Wt 168 lb (76.2 kg)   LMP  (LMP Unknown)   SpO2 95%   BMI 30.73 kg/m   BP Readings from Last 3 Encounters:  02/25/24 119/71  02/21/24 (!) 141/73  01/19/24 120/67    Wt Readings from Last 3 Encounters:  02/25/24 168 lb (76.2 kg)  01/19/24 167 lb 1.6 oz (75.8 kg)  12/31/23 168 lb 12.8 oz (76.6 kg)    Physical Exam Constitutional:      General: She is not in acute distress.    Appearance: She is well-developed. She is obese.  HENT:     Head: Normocephalic.     Right Ear: External ear normal.     Left Ear: External ear normal.     Nose: Congestion and rhinorrhea present.  Eyes:     General:        Right eye: No discharge.        Left eye: No discharge.     Conjunctiva/sclera: Conjunctivae normal.     Pupils: Pupils are equal, round, and reactive to light.  Neck:     Thyroid: No thyromegaly.     Vascular: No JVD.     Trachea: No tracheal deviation.  Cardiovascular:     Rate and Rhythm: Normal rate and regular rhythm.     Heart sounds: Normal heart sounds.  Pulmonary:     Effort: No respiratory distress.     Breath sounds: No stridor. No wheezing.  Abdominal:     General: Bowel sounds are normal. There is no distension.     Palpations: Abdomen is soft. There is no mass.     Tenderness: There is no abdominal tenderness. There is no guarding or rebound.  Musculoskeletal:        General: No tenderness.     Cervical back: Normal range of motion and neck supple. No rigidity.  Lymphadenopathy:     Cervical: No cervical adenopathy.  Skin:    Findings: No erythema or rash.  Neurological:     Cranial Nerves: No cranial nerve deficit.     Motor: No abnormal muscle tone.     Coordination: Coordination normal.     Deep Tendon Reflexes:  Reflexes normal.  Psychiatric:        Behavior: Behavior normal.        Thought Content: Thought content normal.        Judgment: Judgment normal.     Lab Results  Component Value Date   WBC 8.0 02/21/2024   HGB 13.8 02/21/2024   HCT 41.1 02/21/2024   PLT 304 02/21/2024   GLUCOSE 109 (H) 02/25/2024   CHOL 188 12/14/2023   TRIG 87.0 12/14/2023   HDL 62.20 12/14/2023   LDLDIRECT 56 06/25/2021   LDLCALC 108 (H) 12/14/2023   ALT 12 02/25/2024   AST 19 02/25/2024   NA 139 02/25/2024   K 4.1 02/25/2024   CL 103 02/25/2024   CREATININE 0.60 02/25/2024   BUN 14 02/25/2024   CO2 30 02/25/2024   TSH 1.43 09/09/2022   INR 1.1 (H) 08/22/2020   HGBA1C 7.0 (H) 02/25/2024   MICROALBUR 2.0 (H) 12/14/2023    DG Chest 1 View Result Date: 02/21/2024 EXAM: 1 VIEW XRAY OF THE CHEST 02/21/2024 09:02:25 AM COMPARISON: 2 view chest x-ray 02/18/2022. CLINICAL HISTORY: SOB. FINDINGS: LUNGS AND PLEURA: No focal pulmonary opacity. No pulmonary edema. No pleural effusion. No pneumothorax. Changes of COPD are noted. HEART AND MEDIASTINUM: Cardiomegaly is stable. Atherosclerotic changes are again noted at the aortic arch. BONES AND SOFT TISSUES: No acute osseous abnormality. IMPRESSION: 1. Stable cardiomegaly and atherosclerotic changes at the aortic arch. 2. Changes of COPD. Electronically signed by: Lonni Necessary MD 02/21/2024 09:10 AM EDT RP Workstation: [id number]    Assessment & Plan:   Problem List Items Addressed This Visit     Anxiety and depression   Chronic Cont on Xanax prn  Potential benefits of a long term benzodiazepines  use as well as potential risks  and complications were explained to the patient and were aknowledged.      Cystitis   Probable - omnicef  obtain urinalysis.      Type 2 diabetes mellitus with obesity (HCC)   Check A1c Not taking Prandin      Relevant Orders   Comprehensive metabolic panel with GFR (Completed)   Hemoglobin A1c (Completed)   Upper  respiratory infection - Primary   New Omnicef po         Meds ordered this encounter  Medications   DISCONTD: cefdinir (OMNICEF) 300 MG capsule    Sig: Take 1 capsule (300 mg total) by mouth 2 (two) times daily.    Dispense:  20 capsule    Refill:  0      Follow-up: Return in about 3 months (around 05/27/2024) for a follow-up visit.  Marolyn Noel, MD

## 2024-02-28 ENCOUNTER — Ambulatory Visit: Payer: Self-pay | Admitting: Internal Medicine

## 2024-03-02 ENCOUNTER — Other Ambulatory Visit: Payer: Self-pay | Admitting: Internal Medicine

## 2024-03-02 ENCOUNTER — Encounter: Payer: Self-pay | Admitting: Internal Medicine

## 2024-03-06 ENCOUNTER — Encounter: Payer: Self-pay | Admitting: Internal Medicine

## 2024-03-06 NOTE — Assessment & Plan Note (Signed)
Chronic Cont on Xanax prn  Potential benefits of a long term benzodiazepines  use as well as potential risks  and complications were explained to the patient and were aknowledged. 

## 2024-03-10 ENCOUNTER — Other Ambulatory Visit: Payer: Self-pay | Admitting: Internal Medicine

## 2024-03-10 MED ORDER — AZITHROMYCIN 250 MG PO TABS: ORAL_TABLET | ORAL | 0 refills | Status: DC

## 2024-03-10 NOTE — Progress Notes (Signed)
 Rx

## 2024-03-11 DIAGNOSIS — H00011 Hordeolum externum right upper eyelid: Secondary | ICD-10-CM | POA: Diagnosis not present

## 2024-03-11 DIAGNOSIS — L080 Pyoderma: Secondary | ICD-10-CM | POA: Diagnosis not present

## 2024-03-15 ENCOUNTER — Encounter: Payer: Self-pay | Admitting: Internal Medicine

## 2024-03-15 ENCOUNTER — Ambulatory Visit: Admitting: Internal Medicine

## 2024-03-15 VITALS — BP 118/60 | HR 70 | Temp 97.9°F | Ht 62.0 in | Wt 166.0 lb

## 2024-03-15 DIAGNOSIS — F32A Depression, unspecified: Secondary | ICD-10-CM

## 2024-03-15 DIAGNOSIS — E66812 Obesity, class 2: Secondary | ICD-10-CM

## 2024-03-15 DIAGNOSIS — E1169 Type 2 diabetes mellitus with other specified complication: Secondary | ICD-10-CM | POA: Diagnosis not present

## 2024-03-15 DIAGNOSIS — I10 Essential (primary) hypertension: Secondary | ICD-10-CM

## 2024-03-15 DIAGNOSIS — E669 Obesity, unspecified: Secondary | ICD-10-CM | POA: Diagnosis not present

## 2024-03-15 DIAGNOSIS — F419 Anxiety disorder, unspecified: Secondary | ICD-10-CM | POA: Diagnosis not present

## 2024-03-15 DIAGNOSIS — Z6836 Body mass index (BMI) 36.0-36.9, adult: Secondary | ICD-10-CM

## 2024-03-15 DIAGNOSIS — E785 Hyperlipidemia, unspecified: Secondary | ICD-10-CM

## 2024-03-15 LAB — COMPREHENSIVE METABOLIC PANEL WITH GFR
ALT: 13 U/L (ref 0–35)
AST: 20 U/L (ref 0–37)
Albumin: 4.1 g/dL (ref 3.5–5.2)
Alkaline Phosphatase: 75 U/L (ref 39–117)
BUN: 16 mg/dL (ref 6–23)
CO2: 29 meq/L (ref 19–32)
Calcium: 9 mg/dL (ref 8.4–10.5)
Chloride: 102 meq/L (ref 96–112)
Creatinine, Ser: 0.66 mg/dL (ref 0.40–1.20)
GFR: 85.53 mL/min (ref >60.00)
Glucose, Bld: 133 mg/dL — ABNORMAL HIGH (ref 70–99)
Potassium: 4.2 meq/L (ref 3.5–5.1)
Sodium: 141 meq/L (ref 135–145)
Total Bilirubin: 0.6 mg/dL (ref 0.2–1.2)
Total Protein: 7 g/dL (ref 6.0–8.3)

## 2024-03-15 LAB — VITAMIN B12: Vitamin B-12: 923 pg/mL — ABNORMAL HIGH (ref 211–911)

## 2024-03-15 LAB — HEMOGLOBIN A1C: Hgb A1c MFr Bld: 7.1 % — ABNORMAL HIGH (ref 4.6–6.5)

## 2024-03-15 MED ORDER — ALPRAZOLAM 0.5 MG PO TABS: 0.5000 mg | ORAL_TABLET | Freq: Two times a day (BID) | ORAL | 2 refills | Status: DC | PRN

## 2024-03-15 NOTE — Assessment & Plan Note (Signed)
 Wt Readings from Last 3 Encounters:  03/15/24 166 lb (75.3 kg)  02/25/24 168 lb (76.2 kg)  01/19/24 167 lb 1.6 oz (75.8 kg)

## 2024-03-15 NOTE — Progress Notes (Signed)
 Subjective:  Patient ID: Amanda Arnold, female    DOB: May 12, 1946  Age: 78 y.o. MRN: [id number]  CC: Follow-up (3mo; still have a little pain on right side; also would like to go over lab results)   HPI Amanda Arnold presents for DM, HTN, UTI - resolved C/o anxiety, grief  Outpatient Medications Prior to Visit  Medication Sig Dispense Refill   acetaminophen  (TYLENOL ) 500 MG tablet Take 1,000 mg by mouth every 6 (six) hours as needed.     azithromycin  (ZITHROMAX  Z-PAK) 250 MG tablet As directed 6 tablet 0   Cholecalciferol (VITAMIN D3) 50 MCG (2000 UT) capsule Take 1 capsule (2,000 Units total) by mouth daily. 100 capsule 3   Cyanocobalamin  (VITAMIN B-12) 1000 MCG SUBL Place 1 tablet (1,000 mcg total) under the tongue daily. 100 tablet 3   erythromycin ophthalmic ointment Place 1 Application into the right eye 3 (three) times daily.     ezetimibe  (ZETIA ) 10 MG tablet Take 1 tablet (10 mg total) by mouth daily. 90 tablet 3   HYDROcodone -acetaminophen  (NORCO) 7.5-325 MG tablet Take 1 tablet by mouth every 8 (eight) hours as needed for severe pain (pain score 7-10). 60 tablet 0   ALPRAZolam  (XANAX ) 0.5 MG tablet Take 1 tablet (0.5 mg total) by mouth 2 (two) times daily as needed for anxiety. 60 tablet 2   No facility-administered medications prior to visit.    ROS: Review of Systems  Constitutional:  Positive for fatigue. Negative for activity change, appetite change, chills and unexpected weight change.  HENT:  Negative for congestion, mouth sores and sinus pressure.   Eyes:  Negative for visual disturbance.  Respiratory:  Negative for cough and chest tightness.   Gastrointestinal:  Negative for abdominal pain and nausea.  Genitourinary:  Negative for difficulty urinating, frequency and vaginal pain.  Musculoskeletal:  Positive for back pain. Negative for gait problem.  Skin:  Negative for pallor and rash.  Neurological:  Negative for dizziness, tremors, weakness, numbness and  headaches.  Psychiatric/Behavioral:  Positive for dysphoric mood and sleep disturbance. Negative for confusion and suicidal ideas. The patient is nervous/anxious.     Objective:  BP 118/60   Pulse 70   Temp 97.9 F (36.6 C)   Ht 5' 2 (1.575 m)   Wt 166 lb (75.3 kg)   LMP  (LMP Unknown)   SpO2 96%   BMI 30.36 kg/m   BP Readings from Last 3 Encounters:  03/15/24 118/60  02/25/24 119/71  02/21/24 (!) 141/73    Wt Readings from Last 3 Encounters:  03/15/24 166 lb (75.3 kg)  02/25/24 168 lb (76.2 kg)  01/19/24 167 lb 1.6 oz (75.8 kg)    Physical Exam Constitutional:      General: She is not in acute distress.    Appearance: She is well-developed. She is obese.  HENT:     Head: Normocephalic.     Right Ear: External ear normal.     Left Ear: External ear normal.     Nose: Nose normal.  Eyes:     General:        Right eye: No discharge.        Left eye: No discharge.     Conjunctiva/sclera: Conjunctivae normal.     Pupils: Pupils are equal, round, and reactive to light.  Neck:     Thyroid : No thyromegaly.     Vascular: No JVD.     Trachea: No tracheal deviation.  Cardiovascular:  Rate and Rhythm: Normal rate and regular rhythm.     Heart sounds: Normal heart sounds.  Pulmonary:     Effort: No respiratory distress.     Breath sounds: No stridor. No wheezing.  Abdominal:     General: Bowel sounds are normal. There is no distension.     Palpations: Abdomen is soft. There is no mass.     Tenderness: There is no abdominal tenderness. There is no guarding or rebound.  Musculoskeletal:        General: Tenderness present.     Cervical back: Normal range of motion and neck supple. No rigidity.     Right lower leg: No edema.     Left lower leg: No edema.  Lymphadenopathy:     Cervical: No cervical adenopathy.  Skin:    Findings: No erythema or rash.  Neurological:     Mental Status: She is oriented to person, place, and time.     Cranial Nerves: No cranial nerve  deficit.     Motor: No abnormal muscle tone.     Coordination: Coordination normal.     Deep Tendon Reflexes: Reflexes normal.  Psychiatric:        Behavior: Behavior normal.        Thought Content: Thought content normal.        Judgment: Judgment normal.     Lab Results  Component Value Date   WBC 8.0 02/21/2024   HGB 13.8 02/21/2024   HCT 41.1 02/21/2024   PLT 304 02/21/2024   GLUCOSE 109 (H) 02/25/2024   CHOL 188 12/14/2023   TRIG 87.0 12/14/2023   HDL 62.20 12/14/2023   LDLDIRECT 56 06/25/2021   LDLCALC 108 (H) 12/14/2023   ALT 12 02/25/2024   AST 19 02/25/2024   NA 139 02/25/2024   K 4.1 02/25/2024   CL 103 02/25/2024   CREATININE 0.60 02/25/2024   BUN 14 02/25/2024   CO2 30 02/25/2024   TSH 1.43 09/09/2022   INR 1.1 (H) 08/22/2020   HGBA1C 7.0 (H) 02/25/2024   MICROALBUR 2.0 (H) 12/14/2023    DG Chest 1 View Result Date: 02/21/2024 EXAM: 1 VIEW XRAY OF THE CHEST 02/21/2024 09:02:25 AM COMPARISON: 2 view chest x-ray 02/18/2022. CLINICAL HISTORY: SOB. FINDINGS: LUNGS AND PLEURA: No focal pulmonary opacity. No pulmonary edema. No pleural effusion. No pneumothorax. Changes of COPD are noted. HEART AND MEDIASTINUM: Cardiomegaly is stable. Atherosclerotic changes are again noted at the aortic arch. BONES AND SOFT TISSUES: No acute osseous abnormality. IMPRESSION: 1. Stable cardiomegaly and atherosclerotic changes at the aortic arch. 2. Changes of COPD. Electronically signed by: Lonni Necessary MD 02/21/2024 09:10 AM EDT RP Workstation: [id number]    Assessment & Plan:   Problem List Items Addressed This Visit     Anxiety and depression - Primary   Chronic Grieving discussed Cont on Xanax  prn  Potential benefits of a long term benzodiazepines  use as well as potential risks  and complications were explained to the patient and were aknowledged.      Relevant Medications   ALPRAZolam  (XANAX ) 0.5 MG tablet   Dyslipidemia   2021 Cor CT morphology: 1. Coronary  calcium  score of 8. This was 35 percentile for age and sex matched control.   2. Normal coronary origin with right dominance.   3.  Mild RCA calcified plaque, 0-24% stenosis.   4. CAD-RADS 1. Minimal non-obstructive CAD (0-24%). Consider non-atherosclerotic causes of chest pain. Consider preventive therapy and risk factor modification.  Off Crestor  -  pt stopped Check Lipids      Relevant Orders   Comprehensive metabolic panel with GFR   Hemoglobin A1c   Essential hypertension   NAS diet      Obesity   Wt Readings from Last 3 Encounters:  03/15/24 166 lb (75.3 kg)  02/25/24 168 lb (76.2 kg)  01/19/24 167 lb 1.6 oz (75.8 kg)          Type 2 diabetes mellitus with obesity (HCC)   Monitor A1c On diet      Relevant Orders   Comprehensive metabolic panel with GFR   Hemoglobin A1c   Vitamin B12      Meds ordered this encounter  Medications   ALPRAZolam  (XANAX ) 0.5 MG tablet    Sig: Take 1 tablet (0.5 mg total) by mouth 2 (two) times daily as needed for anxiety.    Dispense:  60 tablet    Refill:  2      Follow-up: Return in about 3 months (around 06/14/2024) for a follow-up visit.  Marolyn Noel, MD

## 2024-03-15 NOTE — Assessment & Plan Note (Signed)
 Chronic Grieving discussed Cont on Xanax  prn  Potential benefits of a long term benzodiazepines  use as well as potential risks  and complications were explained to the patient and were aknowledged.

## 2024-03-15 NOTE — Assessment & Plan Note (Signed)
 2021 Cor CT morphology: 1. Coronary calcium  score of 8. This was 27 percentile for age and sex matched control.   2. Normal coronary origin with right dominance.   3.  Mild RCA calcified plaque, 0-24% stenosis.   4. CAD-RADS 1. Minimal non-obstructive CAD (0-24%). Consider non-atherosclerotic causes of chest pain. Consider preventive therapy and risk factor modification.  Off Crestor  - pt stopped Check Lipids

## 2024-03-15 NOTE — Assessment & Plan Note (Signed)
Monitor A1c On diet

## 2024-03-15 NOTE — Assessment & Plan Note (Signed)
NAS diet

## 2024-03-18 ENCOUNTER — Ambulatory Visit: Payer: Self-pay | Admitting: Internal Medicine

## 2024-04-29 ENCOUNTER — Other Ambulatory Visit: Payer: Self-pay | Admitting: General Surgery

## 2024-04-29 DIAGNOSIS — C252 Malignant neoplasm of tail of pancreas: Secondary | ICD-10-CM

## 2024-04-29 DIAGNOSIS — R1031 Right lower quadrant pain: Secondary | ICD-10-CM

## 2024-04-29 DIAGNOSIS — R911 Solitary pulmonary nodule: Secondary | ICD-10-CM | POA: Diagnosis not present

## 2024-05-06 ENCOUNTER — Ambulatory Visit
Admission: RE | Admit: 2024-05-06 | Discharge: 2024-05-06 | Disposition: A | Discharge status: Home / Self Care | Source: Ambulatory Visit | Attending: General Surgery | Admitting: General Surgery

## 2024-05-06 DIAGNOSIS — C252 Malignant neoplasm of tail of pancreas: Secondary | ICD-10-CM | POA: Diagnosis not present

## 2024-05-06 DIAGNOSIS — R1031 Right lower quadrant pain: Secondary | ICD-10-CM

## 2024-05-06 DIAGNOSIS — N200 Calculus of kidney: Secondary | ICD-10-CM | POA: Diagnosis not present

## 2024-05-06 DIAGNOSIS — R911 Solitary pulmonary nodule: Secondary | ICD-10-CM

## 2024-05-06 MED ORDER — IOPAMIDOL (ISOVUE-370) INJECTION 76%
80.0000 mL | Freq: Once | INTRAVENOUS | Status: AC | PRN
  Administered 2024-05-06: 80 mL via INTRAVENOUS

## 2024-06-14 ENCOUNTER — Ambulatory Visit: Admitting: Internal Medicine

## 2024-06-14 ENCOUNTER — Encounter: Payer: Self-pay | Admitting: Internal Medicine

## 2024-06-14 VITALS — BP 122/74 | HR 76 | Temp 98.1°F | Ht 62.0 in

## 2024-06-14 DIAGNOSIS — E1165 Type 2 diabetes mellitus with hyperglycemia: Secondary | ICD-10-CM | POA: Diagnosis not present

## 2024-06-14 DIAGNOSIS — G8929 Other chronic pain: Secondary | ICD-10-CM | POA: Diagnosis not present

## 2024-06-14 DIAGNOSIS — M5440 Lumbago with sciatica, unspecified side: Secondary | ICD-10-CM

## 2024-06-14 DIAGNOSIS — E669 Obesity, unspecified: Secondary | ICD-10-CM

## 2024-06-14 DIAGNOSIS — Z6830 Body mass index (BMI) 30.0-30.9, adult: Secondary | ICD-10-CM

## 2024-06-14 DIAGNOSIS — R739 Hyperglycemia, unspecified: Secondary | ICD-10-CM

## 2024-06-14 DIAGNOSIS — C252 Malignant neoplasm of tail of pancreas: Secondary | ICD-10-CM

## 2024-06-14 MED ORDER — ALPRAZOLAM 0.5 MG PO TABS: 0.5000 mg | ORAL_TABLET | Freq: Two times a day (BID) | ORAL | 2 refills | Status: AC | PRN

## 2024-06-14 MED ORDER — HYDROCODONE-ACETAMINOPHEN 7.5-325 MG PO TABS: 1.0000 | ORAL_TABLET | Freq: Three times a day (TID) | ORAL | 0 refills | Status: AC | PRN

## 2024-06-14 NOTE — Assessment & Plan Note (Signed)
 DM- diet controlled A1c 7.1% Check labs again

## 2024-06-14 NOTE — Assessment & Plan Note (Signed)
 Chronic MSK/OA.-- spinal stenosis  Norco prn  Potential benefits of a long term opioids use as well as potential risks (i.e. addiction risk, apnea etc) and complications (i.e. Somnolence, constipation and others) were explained to the patient and were aknowledged.  Potential benefits of a long term NSAIDs  use as well as potential risks  and complications were explained to the patient and were aknowledged.  Handicapped decal

## 2024-06-14 NOTE — Assessment & Plan Note (Signed)
 Dr Aron did a CT - no cancer (10.2025)

## 2024-06-14 NOTE — Progress Notes (Signed)
 Subjective:  Patient ID: Amanda Arnold, female    DOB: [birth year]  Age: 78 y.o. MRN: [id number]  CC: Medical Management of Chronic Issues (3 Month follow up)   HPI LASHAYLA ARMES presents for LBP irrad to B legs after walking for a while, anxiety, h/o pancreatic cancer, DM2 diet controlled   Outpatient Medications Prior to Visit  Medication Sig Dispense Refill   acetaminophen  (TYLENOL ) 500 MG tablet Take 1,000 mg by mouth every 6 (six) hours as needed.     azithromycin  (ZITHROMAX  Z-PAK) 250 MG tablet As directed 6 tablet 0   Cholecalciferol (VITAMIN D3) 50 MCG (2000 UT) capsule Take 1 capsule (2,000 Units total) by mouth daily. 100 capsule 3   Cyanocobalamin  (VITAMIN B-12) 1000 MCG SUBL Place 1 tablet (1,000 mcg total) under the tongue daily. 100 tablet 3   erythromycin ophthalmic ointment Place 1 Application into the right eye 3 (three) times daily.     ezetimibe  (ZETIA ) 10 MG tablet Take 1 tablet (10 mg total) by mouth daily. 90 tablet 3   ALPRAZolam  (XANAX ) 0.5 MG tablet Take 1 tablet (0.5 mg total) by mouth 2 (two) times daily as needed for anxiety. 60 tablet 2   HYDROcodone -acetaminophen  (NORCO) 7.5-325 MG tablet Take 1 tablet by mouth every 8 (eight) hours as needed for severe pain (pain score 7-10). 60 tablet 0   No facility-administered medications prior to visit.    ROS: Review of Systems  Constitutional:  Positive for fatigue. Negative for activity change, appetite change, chills and unexpected weight change.  HENT:  Negative for congestion, mouth sores and sinus pressure.   Eyes:  Negative for visual disturbance.  Respiratory:  Negative for cough and chest tightness.   Gastrointestinal:  Negative for abdominal pain and nausea.  Genitourinary:  Negative for difficulty urinating, frequency and vaginal pain.  Musculoskeletal:  Positive for back pain and gait problem.  Skin:  Negative for pallor and rash.  Neurological:  Negative for dizziness, tremors, weakness,  numbness and headaches.  Hematological:  Bruises/bleeds easily.  Psychiatric/Behavioral:  Positive for decreased concentration. Negative for confusion, sleep disturbance and suicidal ideas.     Objective:  BP 122/74   Pulse 76   Temp 98.1 F (36.7 C)   Ht 5' 2 (1.575 m)   LMP  (LMP Unknown)   SpO2 99%   BMI 30.36 kg/m   BP Readings from Last 3 Encounters:  06/14/24 122/74  03/15/24 118/60  02/25/24 119/71    Wt Readings from Last 3 Encounters:  03/15/24 166 lb (75.3 kg)  02/25/24 168 lb (76.2 kg)  01/19/24 167 lb 1.6 oz (75.8 kg)    Physical Exam Constitutional:      General: She is not in acute distress.    Appearance: She is well-developed. She is obese.  HENT:     Head: Normocephalic.     Right Ear: External ear normal.     Left Ear: External ear normal.     Nose: Nose normal.  Eyes:     General:        Right eye: No discharge.        Left eye: No discharge.     Conjunctiva/sclera: Conjunctivae normal.     Pupils: Pupils are equal, round, and reactive to light.  Neck:     Thyroid : No thyromegaly.     Vascular: No JVD.     Trachea: No tracheal deviation.  Cardiovascular:     Rate and Rhythm: Normal rate and regular rhythm.  Heart sounds: Normal heart sounds.  Pulmonary:     Effort: No respiratory distress.     Breath sounds: No stridor. No wheezing.  Abdominal:     General: Bowel sounds are normal. There is no distension.     Palpations: Abdomen is soft. There is no mass.     Tenderness: There is no abdominal tenderness. There is no guarding or rebound.  Musculoskeletal:        General: Tenderness present.     Cervical back: Normal range of motion and neck supple. No rigidity.     Right lower leg: No edema.     Left lower leg: No edema.  Lymphadenopathy:     Cervical: No cervical adenopathy.  Skin:    Findings: No erythema or rash.  Neurological:     Cranial Nerves: No cranial nerve deficit.     Motor: No abnormal muscle tone.      Coordination: Coordination normal.     Deep Tendon Reflexes: Reflexes normal.  Psychiatric:        Behavior: Behavior normal.        Thought Content: Thought content normal.        Judgment: Judgment normal.    LS spine w/pain  Lab Results  Component Value Date   WBC 8.0 02/21/2024   HGB 13.8 02/21/2024   HCT 41.1 02/21/2024   PLT 304 02/21/2024   GLUCOSE 133 (H) 03/15/2024   CHOL 188 12/14/2023   TRIG 87.0 12/14/2023   HDL 62.20 12/14/2023   LDLDIRECT 56 06/25/2021   LDLCALC 108 (H) 12/14/2023   ALT 13 03/15/2024   AST 20 03/15/2024   NA 141 03/15/2024   K 4.2 03/15/2024   CL 102 03/15/2024   CREATININE 0.66 03/15/2024   BUN 16 03/15/2024   CO2 29 03/15/2024   TSH 1.43 09/09/2022   INR 1.1 (H) 08/22/2020   HGBA1C 7.1 (H) 03/15/2024   MICROALBUR 2.0 (H) 12/14/2023    CT ABDOMEN PELVIS W CONTRAST Result Date: 05/06/2024 CLINICAL DATA:  History of right lower quadrant pain with pancreatic tail cancer. Right middle lobe pulmonary nodule. * Tracking Code: BO * EXAM: CT CHEST WITHOUT ABDOMEN, AND PELVIS WITH CONTRAST TECHNIQUE: Multidetector CT imaging of the chest without contrast, abdomen and pelvis was performed following the standard protocol during bolus administration of intravenous contrast. RADIATION DOSE REDUCTION: This exam was performed according to the departmental dose-optimization program which includes automated exposure control, adjustment of the mA and/or kV according to patient size and/or use of iterative reconstruction technique. CONTRAST:  80mL ISOVUE -370 IOPAMIDOL  (ISOVUE -370) INJECTION 76% COMPARISON:  02/12/2022 abdominal MRI. 07/26/2021 abdominopelvic CT. Most recent chest CT of 08/05/2023. FINDINGS: CT CHEST FINDINGS Cardiovascular: Aortic atherosclerosis. Normal heart size, without pericardial effusion. Mild lipomatous hypertrophy of the interatrial septum. Lad and right coronary artery calcification. Mediastinum/Nodes: No mediastinal or hilar adenopathy,  given limitations of unenhanced CT. Lungs/Pleura: No pleural fluid. 4 mm right middle lobe pulmonary nodule is unchanged on image 88/8. More anterior and medial right middle lobe probable scarring is unchanged with a somewhat nodular component posteriorly on image 81/8, similar. Musculoskeletal: Probable sebaceous cyst about the left posterolateral chest wall on image 100/2. Mild osteopenia.  Mid and lower thoracic spondylosis. CT ABDOMEN PELVIS FINDINGS Hepatobiliary: Mild nonspecific caudate lobe enlargement. No suspicious liver lesion. Cholecystectomy, without biliary ductal dilatation. Pancreas: Status post pancreatic tail resection. Resolution of previously described post-surgical fluid collection since the prior CT. Spleen: Splenectomy. Adrenals/Urinary Tract: Bilateral, left larger than right adrenal nodules which  were confirmed to be benign adenomas on 02/12/2022 MRI. Example at up to 2.2 cm. Bilateral too small to characterize renal lesions which are most likely cysts and do not warrant specific imaging follow-up. Bilateral punctate renal collecting system calculi. No hydronephrosis. Normal urinary bladder. Stomach/Bowel: Normal stomach, without wall thickening. Moderate stool in the rectum. Scattered colonic diverticula. Normal terminal ileum. Appendix anteriorly positioned normal included on 62/3. Normal small bowel. Vascular/Lymphatic: Aortic atherosclerosis. Patent portal and splenic veins. Prominent porta hepatis nodes are similar and likely reactive. No abdominopelvic adenopathy. Reproductive: Calcified uterine fundal fibroid is eccentric left at 2.7 cm. No adnexal mass. Other: No significant free fluid. Mild pelvic floor laxity. No evidence of omental or peritoneal disease. Fat containing ventral pelvic wall laxity. Musculoskeletal: Osteopenia. Probable bone island in the anterior right acetabulum, similar. Lumbosacral spondylosis. IMPRESSION: 1. Status post pancreatic tail resection, without  residual/recurrent or metastatic disease. 2. Bilateral nonobstructive nephrolithiasis. 3. Possible constipation. No other explanation for right-sided pain. 4. Incidental findings, including: Coronary artery atherosclerosis. Aortic Atherosclerosis (ICD10-I70.0). Uterine fibroid. Bilateral adrenal adenomas. Electronically Signed   By: Rockey Kilts M.D.   On: 05/06/2024 14:03   CT CHEST WO CONTRAST Result Date: 05/06/2024 CLINICAL DATA:  History of right lower quadrant pain with pancreatic tail cancer. Right middle lobe pulmonary nodule. * Tracking Code: BO * EXAM: CT CHEST WITHOUT ABDOMEN, AND PELVIS WITH CONTRAST TECHNIQUE: Multidetector CT imaging of the chest without contrast, abdomen and pelvis was performed following the standard protocol during bolus administration of intravenous contrast. RADIATION DOSE REDUCTION: This exam was performed according to the departmental dose-optimization program which includes automated exposure control, adjustment of the mA and/or kV according to patient size and/or use of iterative reconstruction technique. CONTRAST:  80mL ISOVUE -370 IOPAMIDOL  (ISOVUE -370) INJECTION 76% COMPARISON:  02/12/2022 abdominal MRI. 07/26/2021 abdominopelvic CT. Most recent chest CT of 08/05/2023. FINDINGS: CT CHEST FINDINGS Cardiovascular: Aortic atherosclerosis. Normal heart size, without pericardial effusion. Mild lipomatous hypertrophy of the interatrial septum. Lad and right coronary artery calcification. Mediastinum/Nodes: No mediastinal or hilar adenopathy, given limitations of unenhanced CT. Lungs/Pleura: No pleural fluid. 4 mm right middle lobe pulmonary nodule is unchanged on image 88/8. More anterior and medial right middle lobe probable scarring is unchanged with a somewhat nodular component posteriorly on image 81/8, similar. Musculoskeletal: Probable sebaceous cyst about the left posterolateral chest wall on image 100/2. Mild osteopenia.  Mid and lower thoracic spondylosis. CT ABDOMEN  PELVIS FINDINGS Hepatobiliary: Mild nonspecific caudate lobe enlargement. No suspicious liver lesion. Cholecystectomy, without biliary ductal dilatation. Pancreas: Status post pancreatic tail resection. Resolution of previously described post-surgical fluid collection since the prior CT. Spleen: Splenectomy. Adrenals/Urinary Tract: Bilateral, left larger than right adrenal nodules which were confirmed to be benign adenomas on 02/12/2022 MRI. Example at up to 2.2 cm. Bilateral too small to characterize renal lesions which are most likely cysts and do not warrant specific imaging follow-up. Bilateral punctate renal collecting system calculi. No hydronephrosis. Normal urinary bladder. Stomach/Bowel: Normal stomach, without wall thickening. Moderate stool in the rectum. Scattered colonic diverticula. Normal terminal ileum. Appendix anteriorly positioned normal included on 62/3. Normal small bowel. Vascular/Lymphatic: Aortic atherosclerosis. Patent portal and splenic veins. Prominent porta hepatis nodes are similar and likely reactive. No abdominopelvic adenopathy. Reproductive: Calcified uterine fundal fibroid is eccentric left at 2.7 cm. No adnexal mass. Other: No significant free fluid. Mild pelvic floor laxity. No evidence of omental or peritoneal disease. Fat containing ventral pelvic wall laxity. Musculoskeletal: Osteopenia. Probable bone island in the anterior right acetabulum, similar.  Lumbosacral spondylosis. IMPRESSION: 1. Status post pancreatic tail resection, without residual/recurrent or metastatic disease. 2. Bilateral nonobstructive nephrolithiasis. 3. Possible constipation. No other explanation for right-sided pain. 4. Incidental findings, including: Coronary artery atherosclerosis. Aortic Atherosclerosis (ICD10-I70.0). Uterine fibroid. Bilateral adrenal adenomas. Electronically Signed   By: Rockey Kilts M.D.   On: 05/06/2024 14:03    Assessment & Plan:   Problem List Items Addressed This Visit      Cancer of pancreas, tail (HCC)   Dr Aron did a CT - no cancer (10.2025)      Relevant Medications   ALPRAZolam  (XANAX ) 0.5 MG tablet   Hyperglycemia   DM- diet controlled A1c 7.1% Check labs again      LOW BACK PAIN - Primary   Chronic MSK/OA.-- spinal stenosis Norco prn  Potential benefits of a long term opioids use as well as potential risks (i.e. addiction risk, apnea etc) and complications (i.e. Somnolence, constipation and others) were explained to the patient and were aknowledged.  Potential benefits of a long term NSAIDs  use as well as potential risks  and complications were explained to the patient and were aknowledged.  Handicapped decal       Relevant Medications   HYDROcodone -acetaminophen  (NORCO) 7.5-325 MG tablet   Type 2 diabetes mellitus in patient with obesity (HCC)   DM- diet controlled A1c 7.1% Check labs again      Relevant Orders   Comprehensive metabolic panel with GFR   CBC with Differential/Platelet   Hemoglobin A1c      Meds ordered this encounter  Medications   ALPRAZolam  (XANAX ) 0.5 MG tablet    Sig: Take 1 tablet (0.5 mg total) by mouth 2 (two) times daily as needed for anxiety.    Dispense:  60 tablet    Refill:  2   HYDROcodone -acetaminophen  (NORCO) 7.5-325 MG tablet    Sig: Take 1 tablet by mouth every 8 (eight) hours as needed for severe pain (pain score 7-10).    Dispense:  60 tablet    Refill:  0    Code: M54.50      Follow-up: Return in about 3 months (around 09/12/2024) for a follow-up visit.  Marolyn Noel, MD

## 2024-06-15 ENCOUNTER — Ambulatory Visit: Payer: Self-pay

## 2024-06-15 NOTE — Telephone Encounter (Signed)
 FYI Only or Action Required?: FYI only for provider: ED advised.  Patient was last seen in primary care on 06/14/2024 by Plotnikov, Karlynn GAILS, MD.  Called Nurse Triage reporting Dizziness.  Symptoms began today.  Interventions attempted: Nothing.  Symptoms are: gradually worsening.  Triage Disposition: Call EMS 911 Now  Patient/caregiver understands and will follow disposition?: Yes   Copied from CRM [id number]. Topic: Clinical - Red Word Triage >> Jun 15, 2024 11:06 AM Amanda Arnold wrote: Red Word that prompted transfer to Nurse Triage: dizziness,pale, sweating Reason for Disposition  [1] Weakness (i.e., paralysis, loss of muscle strength) of the face, arm or leg on one side of the body AND [2] sudden onset AND [3] present now  Answer Assessment - Initial Assessment Questions 1. DESCRIPTION: Describe your dizziness.     Lightheadedness, dizziness 2. LIGHTHEADED: Do you feel lightheaded? (e.g., somewhat faint, woozy, weak upon standing)     Somewhat faint 3. VERTIGO: Do you feel like either you or the room is spinning or tilting? (i.e., vertigo)     no 4. SEVERITY: How bad is it?  Do you feel like you are going to faint? Can you stand and walk?     yes 5. ONSET:  When did the dizziness begin?     today 6. AGGRAVATING FACTORS: Does anything make it worse? (e.g., standing, change in head position)     Unsure 7. HEART RATE: Can you tell me your heart rate? How many beats in 15 seconds?  (Note: Not all patients can do this.)       unsure 8. CAUSE: What do you think is causing the dizziness? (e.g., decreased fluids or food, diarrhea, emotional distress, heat exposure, new medicine, sudden standing, vomiting; unknown)     Unsure - possible diabetes stated daughter 60. RECURRENT SYMPTOM: Have you had dizziness before? If Yes, ask: When was the last time? What happened that time?     Having dizziness for a while per daughter but not this bad 10. OTHER SYMPTOMS: Do  you have any other symptoms? (e.g., fever, chest pain, vomiting, diarrhea, bleeding)       Sweating, pale, no fever, pt feels hot, taking clothes off  11. PREGNANCY: Is there any chance you are pregnant? When was your last menstrual period?       No  Nurse asked daughter if vitals or blood sugar was checked and she replied we dont have anything at home to check it with.  This daughter also told me that she was not at home with patient right now that another sister was there and unsure if pt s/s are better or worse.  Nurse referred pt to ER: daughter wanted to bring pt to office appt to check vitals: nurse informed not able to schedule appt based off s/s therefore pt needs to go to ER.  Protocols used: Dizziness - Lightheadedness-A-AH

## 2024-06-21 ENCOUNTER — Other Ambulatory Visit: Payer: Self-pay | Admitting: Internal Medicine

## 2024-06-21 MED ORDER — BLOOD GLUCOSE TEST VI STRP: 1.0000 | ORAL_STRIP | 0 refills | Status: AC

## 2024-06-21 MED ORDER — LANCETS MISC: 1.0000 | 0 refills | Status: AC

## 2024-06-21 MED ORDER — BLOOD GLUCOSE MONITORING SUPPL DEVI: 1.0000 | 0 refills | Status: AC

## 2024-06-21 MED ORDER — LANCET DEVICE MISC: 1.0000 | 0 refills | Status: AC

## 2024-06-21 NOTE — Telephone Encounter (Signed)
 Please schedule office visit with any provider if not better.  I will send a prescription for diabetic supplies to the pharmacy. Thank you

## 2024-06-21 NOTE — Progress Notes (Signed)
Glucometer

## 2024-06-22 NOTE — Telephone Encounter (Signed)
 Tried to contact pt to inform her of the following per PCP Please schedule office visit with any provider if not better.  I will send a prescription for diabetic supplies to the pharmacy. Thank you

## 2024-07-05 ENCOUNTER — Telehealth: Payer: Self-pay | Admitting: *Deleted

## 2024-07-05 NOTE — Telephone Encounter (Signed)
 Has lung cancer screening CT chest on 07/21/24, but had CT C/A/P per Dr. Aron on 05/06/24. Asking if the January scan is still needed?

## 2024-07-06 ENCOUNTER — Telehealth: Payer: Self-pay | Admitting: *Deleted

## 2024-07-06 NOTE — Telephone Encounter (Signed)
 Notified daughter that per Dr. Cloretta, Amanda Arnold does not need the CT chest for screening on 07/22/23 since she had recent CT C/A/P. F/U with Dr. Cloretta as scheduled on 1/02

## 2024-07-21 ENCOUNTER — Ambulatory Visit (HOSPITAL_BASED_OUTPATIENT_CLINIC_OR_DEPARTMENT_OTHER)

## 2024-07-21 ENCOUNTER — Other Ambulatory Visit: Admitting: Oncology

## 2024-07-25 ENCOUNTER — Inpatient Hospital Stay: Attending: Oncology | Admitting: Oncology

## 2024-07-25 VITALS — BP 133/62 | HR 60 | Temp 98.0°F | Resp 18 | Ht 62.0 in | Wt 169.3 lb

## 2024-07-25 DIAGNOSIS — Z87891 Personal history of nicotine dependence: Secondary | ICD-10-CM | POA: Diagnosis not present

## 2024-07-25 DIAGNOSIS — Z8711 Personal history of peptic ulcer disease: Secondary | ICD-10-CM | POA: Insufficient documentation

## 2024-07-25 DIAGNOSIS — Z8507 Personal history of malignant neoplasm of pancreas: Secondary | ICD-10-CM | POA: Diagnosis present

## 2024-07-25 DIAGNOSIS — Z08 Encounter for follow-up examination after completed treatment for malignant neoplasm: Secondary | ICD-10-CM | POA: Insufficient documentation

## 2024-07-25 DIAGNOSIS — Z8616 Personal history of COVID-19: Secondary | ICD-10-CM | POA: Diagnosis not present

## 2024-07-25 DIAGNOSIS — J449 Chronic obstructive pulmonary disease, unspecified: Secondary | ICD-10-CM | POA: Insufficient documentation

## 2024-07-25 DIAGNOSIS — I10 Essential (primary) hypertension: Secondary | ICD-10-CM | POA: Diagnosis not present

## 2024-07-25 DIAGNOSIS — C252 Malignant neoplasm of tail of pancreas: Secondary | ICD-10-CM

## 2024-07-25 NOTE — Progress Notes (Signed)
 " [facility] OFFICE PROGRESS NOTE   Diagnosis: Pancreas cancer  INTERVAL HISTORY:   Amanda Arnold turns as scheduled.  She generally feels well.  She has chronic pain at the right hip/groin and right leg.  The pain is present with ambulation.  The pain predates the pancreas cancer diagnosis.  She saw Dr. Aron in October.  Restaging CTs showed no evidence of recurrent disease.  Objective:  Vital signs in last 24 hours:  Blood pressure 133/62, pulse 60, temperature 98 F (36.7 C), temperature source Temporal, resp. rate 18, height 5' 2 (1.575 m), weight 169 lb 4.8 oz (76.8 kg), SpO2 100%.    Lymphatics: No cervical, supraclavicular, axillary, or inguinal nodes Resp: Lungs clear bilaterally Cardio: Regular rate and rhythm GI: No hepatosplenomegaly, no mass, no apparent ascites, nontender Vascular: No leg edema Musculoskeletal: No pain with motion at the right hip, tender at the right trochanter, no tenderness at the right iliac Skin: Inclusion cyst at the left lateral back   Lab Results:  Lab Results  Component Value Date   WBC 8.0 02/21/2024   HGB 13.8 02/21/2024   HCT 41.1 02/21/2024   MCV 96.0 02/21/2024   PLT 304 02/21/2024   NEUTROABS 4.9 02/21/2024    CMP  Lab Results  Component Value Date   NA 141 03/15/2024   K 4.2 03/15/2024   CL 102 03/15/2024   CO2 29 03/15/2024   GLUCOSE 133 (H) 03/15/2024   BUN 16 03/15/2024   CREATININE 0.66 03/15/2024   CALCIUM  9.0 03/15/2024   PROT 7.0 03/15/2024   ALBUMIN  4.1 03/15/2024   AST 20 03/15/2024   ALT 13 03/15/2024   ALKPHOS 75 03/15/2024   BILITOT 0.6 03/15/2024   GFRNONAA >60 02/21/2024   GFRAA 103 04/20/2020    Lab Results  Component Value Date   CAN199 10 01/23/2023    Lab Results  Component Value Date   INR 1.1 (H) 08/22/2020   LABPROT 12.6 08/22/2020    Imaging:  No results found.  Medications: I have reviewed the patient's current medications.   Assessment/Plan: Pancreas  cancer-stage IIb (pT2pN1) CT abdomen/pelvis 06/01/2020-2.9 x 3 cm pancreas tail mass, slight increase in size of 7 mm right middle lobe nodule MRI/MRCP 06/22/2020-solid 3.8 x 3 x 3.1 cm pancreas tail mass with focal occlusion of the splenic vein and dilated venous collaterals, no evidence of metastatic disease, bilateral adrenal adenomas CT renal stone study 08/02/2020-scattered airspace opacities throughout the lung bases-new favored to represent infectious/inflammatory changes EUS 08/16/2020-pancreas tail mass, T2N0, FNA biopsy-suspicious for malignancy CTs 08/27/2020-unchanged right middle lobe nodule, superficial left upper back lesion, no change in pancreas tail mass, occluded splenic vein with collaterals at the splenic hilum, no enlarged abdominal pelvic lymph nodes Distal pancreatectomy/splenectomy 10/17/2020- 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  2/15 lymph nodes positive, positive lymphovascular and perineural invasion, resection margins negative, pT2pN1 Cycle 1 gemcitabine /capecitabine  11/21/2020, day 15 gemcitabine  12/05/2020 Cycle 2 gemcitabine /capecitabine  12/20/2020, day 15 gemcitabine  01/03/2021 Cycle 3 gemcitabine /capecitabine  01/16/2021, day 15 gemcitabine  01/30/2021 Cycle 4 gemcitabine /capecitabine  02/13/2021, day 15 gemcitabine  02/27/2021 Cycle 5 gemcitabine /capecitabine  03/13/2021 (capecitabine  dose reduced secondary to hand/foot syndrome), day 15 gemcitabine  03/27/2021 Cycle 6 gemcitabine /capecitabine  04/10/2021, day 15 gemcitabine  04/24/2021 CT abdomen/pelvis 07/26/2021-slight increase in size of a cystic lesion at the distal pancreatic tail/stump.  Additional fluid collections at the surgical bed decreased in size. MRI abdomen 02/12/2022-unchanged nonenhancing fluid signal cyst at the distal pancreatic resection margin without enhancement  consistent with a postoperative seroma or pseudocyst, no evidence of  metastatic disease 05/06/2024 CTs: No evidence of recurrent pancreas cancer   2.   Gastric ulcer on EUS 08/16/2020-H. pylori positive 3.   COVID-19 infection January 2022 4.   COPD 5.   Hypertension 6.   Right abdomen/right leg pain-musculoskeletal? 7.   Right middle lobe nodule on CT 06/01/2020, increased from 2017, stable on CT 11/13/2020, stable on CT 04/23/2021, stable on CT 08/05/2023, stable on CT 05/06/2024 8.   History of tobacco use 9.   Superficial left upper back lesion noted on CT 08/27/2020       Disposition: Amanda Arnold is in remission from pancreas cancer.  She is now greater than 4 years out from diagnosis.  Surveillance imaging in October 2025 revealed no evidence of recurrent disease.  She will return for an office visit in 6 months. She will continue yearly chest CT surveillance with the history of tobacco use.  She has chronic pain at the right hip/groin, potentially related to hip arthritis, bursitis, or spine disease.  We made a referral to orthopedics.  I recommended she stop at the [facility] today to discuss the timing of postsplenectomy vaccines and to obtain a pneumococcal 20/21 vaccine.  Arley Hof, MD  07/25/2024  12:00 PM   "

## 2024-08-17 ENCOUNTER — Ambulatory Visit (HOSPITAL_BASED_OUTPATIENT_CLINIC_OR_DEPARTMENT_OTHER)

## 2024-08-17 ENCOUNTER — Ambulatory Visit (INDEPENDENT_AMBULATORY_CARE_PROVIDER_SITE_OTHER): Admitting: Orthopaedic Surgery

## 2024-08-17 DIAGNOSIS — M25551 Pain in right hip: Secondary | ICD-10-CM

## 2024-08-17 NOTE — Progress Notes (Signed)
 "   Chief Complaint: Right hip pain     History of Present Illness:    Amanda Arnold is a 79 y.o. female presents today with ongoing right hip pain for the last several years which has been progressing.  He denies any history of pain prior to this.  Denies any traumatic history.  She states that she has been noticing a worsening limp for which she does feel weak on the side.  She does have a difficult time laying on the side.  She has had injections into the trochanteric region in the past which she states caused pain and did not give significant relief.    PMH/PSH/Family History/Social History/Meds/Allergies:    Past Medical History:  Diagnosis Date   Abdominal pain, epigastric 12/18/2009   ANXIETY 05/25/2006   BRONCHITIS, ACUTE 12/08/2007   Cataract    Complication of anesthesia     difficulty waking up x 1 - years ago   Constipation    COPD 05/25/2006   COVID-19 07/10/2022   DEPRESSION 01/02/2006   ESOTROPIA, LEFT EYE 03/08/48   Family history of breast cancer 11/26/2020   FIBROMYALGIA 04/02/2006   GERD (gastroesophageal reflux disease)    Headache(784.0) 12/08/2007   History of blood transfusion    HYPERLIPIDEMIA 04/30/2006   HYPERTENSION 05/25/2006   Hypertension    INSOMNIA, PERSISTENT 02/22/2008   Lazy eye    left - no surgery   LOW BACK PAIN 01/27/2007   Migraines    NEURALGIA, 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/2009   Neuropathy    OBSTRUCTIVE SLEEP APNEA 04/17/2010   does not use CPAP   OSTEOARTHRITIS 05/25/2006   pancreatic ca dx'd 06/2020   Polyuria    Pre-diabetes    diet controlled   Psychosomatic disease 2011   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   T M J 05/11/2009   Past Surgical History:  Procedure Laterality Date   BIOPSY  08/16/2020   Procedure: BIOPSY;  Surgeon: Wilhelmenia Aloha Raddle., MD;  Location: [facility] ENDOSCOPY;  Service: Endoscopy;;   cataract surgery Bilateral    CHOLECYSTECTOMY     COLONOSCOPY     ESOPHAGOGASTRODUODENOSCOPY (EGD) WITH PROPOFOL   N/A 08/16/2020   Procedure: ESOPHAGOGASTRODUODENOSCOPY (EGD) WITH PROPOFOL ;  Surgeon: Wilhelmenia Aloha Raddle., MD;  Location: [facility] ENDOSCOPY;  Service: Endoscopy;  Laterality: N/A;   EUS N/A 08/16/2020   Procedure: UPPER ENDOSCOPIC ULTRASOUND (EUS) RADIAL;  Surgeon: Wilhelmenia Aloha Raddle., MD;  Location: [facility] ENDOSCOPY;  Service: Endoscopy;  Laterality: N/A;   EYE SURGERY Bilateral    cataract with lens implant   FINE NEEDLE ASPIRATION  08/16/2020   Procedure: FINE NEEDLE ASPIRATION (FNA) LINEAR;  Surgeon: Wilhelmenia Aloha Raddle., MD;  Location: [facility] ENDOSCOPY;  Service: Endoscopy;;   MASS EXCISION Left 10/06/2016   Procedure: EXCISION LEFT BUTTOCKS MASS;  Surgeon: Alm Angle, MD;  Location: [facility];  Service: General;  Laterality: Left;   PORT-A-CATH REMOVAL N/A 06/13/2021   Procedure: PORT REMOVAL;  Surgeon: Aron Shoulders, MD;  Location: [facility] OR;  Service: General;  Laterality: N/A;   PORTACATH PLACEMENT N/A 11/14/2020   Procedure: INSERTION PORT-A-CATH;  Surgeon: Aron Shoulders, MD;  Location: [facility] OR;  Service: General;  Laterality: N/A;   XI ROBOTIC ASSISTED LAPAROSCOPIC DISTAL PANCREATECTOMY N/A 10/17/2020   Procedure: XI ROBOTIC ASSISTED DISTAL PANCREATECTOMY AND SPLENECTOMY;  Surgeon: Aron Shoulders, MD;  Location: [facility] OR;  Service: General;  Laterality: N/A;   Social History   Socioeconomic History   Marital status: Widowed    Spouse name: Not on file  Number of children: 5   Years of education: Not on file   Highest education level: Not on file  Occupational History   Occupation: house wife    Employer: UNEMPLOYED  Tobacco Use   Smoking status: Former    Current packs/day: 0.00    Types: Cigarettes    Quit date: 07/10/2013    Years since quitting: 11.1   Smokeless tobacco: Current    Types: Snuff   Tobacco comments:    12/31/2023 Patient use Snuff about two times daily  Vaping Use   Vaping status: Never Used  Substance and Sexual Activity   Alcohol use: No     Alcohol/week: 0.0 standard drinks of alcohol   Drug use: No   Sexual activity: Not Currently    Birth control/protection: Post-menopausal  Other Topics Concern   Not on file  Social History Narrative   Lives alone with 1 dog/2025   Social Drivers of Health   Tobacco Use: High Risk (06/14/2024)   Patient History    Smoking Tobacco Use: Former    Smokeless Tobacco Use: Current    Passive Exposure: Not on Actuary Strain: Low Risk (11/25/2023)   Overall Financial Resource Strain (CARDIA)    Difficulty of Paying Living Expenses: Not very hard  Food Insecurity: No Food Insecurity (11/25/2023)   Hunger Vital Sign    Worried About Running Out of Food in the Last Year: Never true    Ran Out of Food in the Last Year: Never true  Transportation Needs: No Transportation Needs (11/25/2023)   PRAPARE - Administrator, Civil Service (Medical): No    Lack of Transportation (Non-Medical): No  Physical Activity: Inactive (11/25/2023)   Exercise Vital Sign    Days of Exercise per Week: 0 days    Minutes of Exercise per Session: 0 min  Stress: No Stress Concern Present (11/25/2023)   [employer] of Occupational Health - Occupational Stress Questionnaire    Feeling of Stress : Only a little  Social Connections: Moderately Integrated (11/25/2023)   Social Connection and Isolation Panel    Frequency of Communication with Friends and Family: More than three times a week    Frequency of Social Gatherings with Friends and Family: More than three times a week    Attends Religious Services: More than 4 times per year    Active Member of [employer] or Organizations: Yes    Attends Banker Meetings: Never    Marital Status: Widowed  Depression (PHQ2-9): Low Risk (07/25/2024)   Depression (PHQ2-9)    PHQ-2 Score: 0  Alcohol Screen: Low Risk (11/25/2023)   Alcohol Screen    Last Alcohol Screening Score (AUDIT): 0  Housing: Unknown (04/29/2024)   Received from  [facility]   Epic    Unable to Pay for Housing in the Last Year: Not on file    Number of Times Moved in the Last Year: Not on file    At any time in the past 12 months, were you homeless or living in a shelter (including now)?: No  Utilities: Not At Risk (11/25/2023)   [employer] Utilities    Threatened with loss of utilities: No  Health Literacy: Inadequate Health Literacy (11/25/2023)   B1300 Health Literacy    Frequency of need for help with medical instructions: Sometimes   Family History  Problem Relation Age of Onset   Heart disease Mother    Cervical cancer Mother  dx unknown age   Skin cancer Mother        dx unknown age   Heart attack Father    Hypertension Brother    Leukemia Brother        d. >50   Breast cancer Maternal Aunt        dx after 50   Cancer Maternal Uncle        ? colon; dx after 50   Skin cancer Daughter        basal cell carcinoma   Colon cancer Neg Hx    Stomach cancer Neg Hx    Colon polyps Neg Hx    Esophageal cancer Neg Hx    Rectal cancer Neg Hx    Allergies[1] Current Outpatient Medications  Medication Sig Dispense Refill   acetaminophen  (TYLENOL ) 500 MG tablet Take 1,000 mg by mouth every 6 (six) hours as needed.     ALPRAZolam  (XANAX ) 0.5 MG tablet Take 1 tablet (0.5 mg total) by mouth 2 (two) times daily as needed for anxiety. 60 tablet 2   Blood Glucose Monitoring Suppl DEVI 1 each by Does not apply route as directed. Dispense based on patient and insurance preference. Use up to four times daily as directed. (FOR ICD-10 E10.9, E11.9). (Patient not taking: Reported on 07/25/2024) 1 each 0   Cholecalciferol (VITAMIN D3) 50 MCG (2000 UT) capsule Take 1 capsule (2,000 Units total) by mouth daily. (Patient taking differently: Take 1,000 Units by mouth daily.) 100 capsule 3   Cyanocobalamin  (VITAMIN B-12) 1000 MCG SUBL Place 1 tablet (1,000 mcg total) under the tongue daily. 100 tablet 3   ezetimibe  (ZETIA ) 10 MG tablet Take 1  tablet (10 mg total) by mouth daily. 90 tablet 3   Glucose Blood (BLOOD GLUCOSE TEST STRIPS) STRP 1 each by Does not apply route as directed. Dispense based on patient and insurance preference. Use up to four times daily as directed. (FOR ICD-10 E10.9, E11.9). (Patient not taking: Reported on 07/25/2024) 100 strip 0   HYDROcodone -acetaminophen  (NORCO) 7.5-325 MG tablet Take 1 tablet by mouth every 8 (eight) hours as needed for severe pain (pain score 7-10). 60 tablet 0   Lancet Device MISC 1 each by Does not apply route as directed. Dispense based on patient and insurance preference. Use up to four times daily as directed. (FOR ICD-10 E10.9, E11.9). (Patient not taking: Reported on 07/25/2024) 1 each 0   Lancets MISC 1 each by Does not apply route as directed. Dispense based on patient and insurance preference. Use up to four times daily as directed. (FOR ICD-10 E10.9, E11.9). (Patient not taking: Reported on 07/25/2024) 100 each 0   No current facility-administered medications for this visit.   No results found.  Review of Systems:   A ROS was performed including pertinent positives and negatives as documented in the HPI.  Physical Exam :   Constitutional: NAD and appears stated age Neurological: Alert and oriented Psych: Appropriate affect and cooperative There were no vitals taken for this visit.   Comprehensive Musculoskeletal Exam:    Right hip with positive Trendelenburg.  There is tenderness and pain about the gluteus as it inserts onto the trochanter laterally.  30 degrees of internal/external rotation of the right hip are without significant pain   Imaging:   Xray (4 views right hip): Significant acetabular undercoverage without significant joint space narrowing     I personally reviewed and interpreted the radiographs.   Assessment and Plan:   79 y.o. 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  At this  time she has had injections in the past which did not give her significant relief.  Given this I did discuss that I would recommend an MRI of the hip so that we could ascertain an underlying gluteus tear.   I personally saw and evaluated the patient, and participated in the management and treatment plan.  Elspeth Parker, MD Attending Physician, Orthopedic Surgery  This document was dictated using Dragon voice recognition software. A reasonable attempt at proof reading has been made to minimize errors.    [1]  Allergies Allergen Reactions   Morphine  And Codeine  Shortness Of Breath   Ceftin  [Cefuroxime ] Other (See Comments)    Difficulty urinating   Trazodone Hcl Swelling   Cefdinir      rash   Bupropion Hcl Other (See Comments)    does not feel right   Ezetimibe  Other (See Comments)    REACTION: legs burning   Metformin  And Related Diarrhea    Diarrhea w/XR or regular    Naproxen  Other (See Comments)    Upset stomach, diarrhea   Tramadol Hcl Palpitations    REACTION: palpitations   "

## 2024-09-14 ENCOUNTER — Ambulatory Visit: Admitting: Internal Medicine

## 2024-11-25 ENCOUNTER — Ambulatory Visit

## 2025-01-23 ENCOUNTER — Inpatient Hospital Stay: Admitting: Oncology
